# Patient Record
Sex: Female | Born: 1940
Health system: Southern US, Community
[De-identification: ages and names within clinical notes are randomized; demographics above are authoritative.]

## PROBLEM LIST (undated history)

## (undated) DIAGNOSIS — G40909 Epilepsy, unspecified, not intractable, without status epilepticus: Secondary | ICD-10-CM

## (undated) DIAGNOSIS — F329 Major depressive disorder, single episode, unspecified: Secondary | ICD-10-CM

## (undated) DIAGNOSIS — G629 Polyneuropathy, unspecified: Secondary | ICD-10-CM

## (undated) DIAGNOSIS — K5730 Diverticulosis of large intestine without perforation or abscess without bleeding: Secondary | ICD-10-CM

## (undated) DIAGNOSIS — G4733 Obstructive sleep apnea (adult) (pediatric): Secondary | ICD-10-CM

## (undated) DIAGNOSIS — I679 Cerebrovascular disease, unspecified: Secondary | ICD-10-CM

## (undated) DIAGNOSIS — Z9989 Dependence on other enabling machines and devices: Secondary | ICD-10-CM

## (undated) DIAGNOSIS — F32A Depression, unspecified: Secondary | ICD-10-CM

## (undated) DIAGNOSIS — F418 Other specified anxiety disorders: Secondary | ICD-10-CM

## (undated) DIAGNOSIS — J309 Allergic rhinitis, unspecified: Secondary | ICD-10-CM

## (undated) DIAGNOSIS — I10 Essential (primary) hypertension: Secondary | ICD-10-CM

## (undated) DIAGNOSIS — E785 Hyperlipidemia, unspecified: Secondary | ICD-10-CM

## (undated) DIAGNOSIS — E119 Type 2 diabetes mellitus without complications: Secondary | ICD-10-CM

## (undated) DIAGNOSIS — M19019 Primary osteoarthritis, unspecified shoulder: Secondary | ICD-10-CM

## (undated) DIAGNOSIS — K219 Gastro-esophageal reflux disease without esophagitis: Secondary | ICD-10-CM

## (undated) DIAGNOSIS — F419 Anxiety disorder, unspecified: Secondary | ICD-10-CM

## (undated) HISTORY — DX: Primary osteoarthritis, unspecified shoulder: M19.019

## (undated) HISTORY — DX: Anxiety disorder, unspecified: F41.9

## (undated) HISTORY — DX: Depression, unspecified: F32.A

## (undated) HISTORY — PX: APPENDECTOMY: SHX54

## (undated) HISTORY — DX: Diverticulosis of large intestine without perforation or abscess without bleeding: K57.30

## (undated) HISTORY — DX: Dependence on other enabling machines and devices: Z99.89

## (undated) HISTORY — PX: TONSILLECTOMY AND ADENOIDECTOMY: SHX28

## (undated) HISTORY — PX: CHOLECYSTECTOMY: SHX55

## (undated) HISTORY — DX: Essential (primary) hypertension: I10

## (undated) HISTORY — PX: ABDOMINAL HYSTERECTOMY: SHX81

## (undated) HISTORY — DX: Obstructive sleep apnea (adult) (pediatric): G47.33

## (undated) HISTORY — DX: Hyperlipidemia, unspecified: E78.5

## (undated) HISTORY — DX: Polyneuropathy, unspecified: G62.9

## (undated) HISTORY — DX: Gastro-esophageal reflux disease without esophagitis: K21.9

## (undated) HISTORY — DX: Type 2 diabetes mellitus without complications: E11.9

## (undated) HISTORY — DX: Other specified anxiety disorders: F41.8

## (undated) HISTORY — DX: Major depressive disorder, single episode, unspecified: F32.9

## (undated) HISTORY — DX: Epilepsy, unspecified, not intractable, without status epilepticus: G40.909

## (undated) HISTORY — DX: Cerebrovascular disease, unspecified: I67.9

## (undated) HISTORY — DX: Allergic rhinitis, unspecified: J30.9

---
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
## 1997-09-13 ENCOUNTER — Ambulatory Visit (HOSPITAL_COMMUNITY): Admission: RE | Admit: 1997-09-13 | Discharge: 1997-09-13 | Payer: Self-pay | Admitting: *Deleted

## 1997-10-01 ENCOUNTER — Other Ambulatory Visit: Admission: RE | Admit: 1997-10-01 | Discharge: 1997-10-01 | Payer: Self-pay | Admitting: Obstetrics & Gynecology

## 1998-12-15 ENCOUNTER — Ambulatory Visit (HOSPITAL_COMMUNITY): Admission: RE | Admit: 1998-12-15 | Discharge: 1998-12-15 | Payer: Self-pay | Admitting: *Deleted

## 1998-12-15 ENCOUNTER — Encounter: Payer: Self-pay | Admitting: *Deleted

## 1999-01-28 ENCOUNTER — Other Ambulatory Visit: Admission: RE | Admit: 1999-01-28 | Discharge: 1999-01-28 | Payer: Self-pay | Admitting: Obstetrics & Gynecology

## 1999-02-19 ENCOUNTER — Encounter: Payer: Self-pay | Admitting: Obstetrics & Gynecology

## 1999-02-19 ENCOUNTER — Ambulatory Visit (HOSPITAL_COMMUNITY): Admission: RE | Admit: 1999-02-19 | Discharge: 1999-02-19 | Payer: Self-pay | Admitting: Obstetrics & Gynecology

## 2000-02-23 ENCOUNTER — Ambulatory Visit (HOSPITAL_COMMUNITY): Admission: RE | Admit: 2000-02-23 | Discharge: 2000-02-23 | Payer: Self-pay | Admitting: Obstetrics & Gynecology

## 2000-02-23 ENCOUNTER — Encounter: Payer: Self-pay | Admitting: Obstetrics & Gynecology

## 2000-07-10 ENCOUNTER — Other Ambulatory Visit: Admission: RE | Admit: 2000-07-10 | Discharge: 2000-07-10 | Payer: Self-pay | Admitting: Obstetrics and Gynecology

## 2001-10-22 ENCOUNTER — Emergency Department (HOSPITAL_COMMUNITY): Admission: EM | Admit: 2001-10-22 | Discharge: 2001-10-22 | Payer: Self-pay | Admitting: Emergency Medicine

## 2001-10-22 ENCOUNTER — Encounter: Payer: Self-pay | Admitting: Emergency Medicine

## 2002-11-21 ENCOUNTER — Inpatient Hospital Stay (HOSPITAL_COMMUNITY): Admission: EM | Admit: 2002-11-21 | Discharge: 2002-11-23 | Payer: Self-pay | Admitting: Emergency Medicine

## 2002-11-21 ENCOUNTER — Encounter: Payer: Self-pay | Admitting: Neurology

## 2002-11-21 ENCOUNTER — Encounter: Payer: Self-pay | Admitting: Emergency Medicine

## 2003-10-15 ENCOUNTER — Ambulatory Visit (HOSPITAL_COMMUNITY): Admission: RE | Admit: 2003-10-15 | Discharge: 2003-10-15 | Payer: Self-pay | Admitting: *Deleted

## 2004-08-18 ENCOUNTER — Inpatient Hospital Stay (HOSPITAL_COMMUNITY): Admission: EM | Admit: 2004-08-18 | Discharge: 2004-08-20 | Payer: Self-pay | Admitting: Emergency Medicine

## 2004-08-19 ENCOUNTER — Encounter (INDEPENDENT_AMBULATORY_CARE_PROVIDER_SITE_OTHER): Payer: Self-pay | Admitting: *Deleted

## 2004-08-19 ENCOUNTER — Ambulatory Visit: Payer: Self-pay | Admitting: Cardiology

## 2005-05-09 ENCOUNTER — Emergency Department (HOSPITAL_COMMUNITY): Admission: EM | Admit: 2005-05-09 | Discharge: 2005-05-09 | Payer: Self-pay | Admitting: Emergency Medicine

## 2005-10-29 ENCOUNTER — Emergency Department (HOSPITAL_COMMUNITY): Admission: EM | Admit: 2005-10-29 | Discharge: 2005-10-29 | Payer: Self-pay | Admitting: Emergency Medicine

## 2005-11-03 ENCOUNTER — Observation Stay (HOSPITAL_COMMUNITY): Admission: AD | Admit: 2005-11-03 | Discharge: 2005-11-04 | Payer: Self-pay | Admitting: Cardiology

## 2005-11-03 ENCOUNTER — Ambulatory Visit: Payer: Self-pay | Admitting: Cardiology

## 2005-11-16 ENCOUNTER — Ambulatory Visit: Payer: Self-pay | Admitting: Cardiology

## 2006-06-27 HISTORY — PX: SHOULDER SURGERY: SHX246

## 2006-07-07 ENCOUNTER — Observation Stay (HOSPITAL_COMMUNITY): Admission: EM | Admit: 2006-07-07 | Discharge: 2006-07-09 | Payer: Self-pay | Admitting: Emergency Medicine

## 2006-07-07 ENCOUNTER — Ambulatory Visit: Payer: Self-pay | Admitting: Cardiology

## 2006-10-22 ENCOUNTER — Observation Stay (HOSPITAL_COMMUNITY): Admission: EM | Admit: 2006-10-22 | Discharge: 2006-10-24 | Payer: Self-pay | Admitting: Emergency Medicine

## 2007-08-04 ENCOUNTER — Observation Stay (HOSPITAL_COMMUNITY): Admission: EM | Admit: 2007-08-04 | Discharge: 2007-08-05 | Payer: Self-pay | Admitting: Emergency Medicine

## 2007-08-05 ENCOUNTER — Encounter (INDEPENDENT_AMBULATORY_CARE_PROVIDER_SITE_OTHER): Payer: Self-pay | Admitting: Internal Medicine

## 2007-09-10 ENCOUNTER — Emergency Department (HOSPITAL_COMMUNITY): Admission: EM | Admit: 2007-09-10 | Discharge: 2007-09-10 | Payer: Self-pay | Admitting: Emergency Medicine

## 2008-04-03 ENCOUNTER — Inpatient Hospital Stay (HOSPITAL_COMMUNITY): Admission: RE | Admit: 2008-04-03 | Discharge: 2008-04-08 | Payer: Self-pay | Admitting: Specialist

## 2008-04-04 ENCOUNTER — Ambulatory Visit: Payer: Self-pay | Admitting: Physical Medicine & Rehabilitation

## 2008-05-06 ENCOUNTER — Encounter: Admission: RE | Admit: 2008-05-06 | Discharge: 2008-06-04 | Payer: Self-pay | Admitting: Specialist

## 2008-08-16 ENCOUNTER — Emergency Department (HOSPITAL_COMMUNITY): Admission: EM | Admit: 2008-08-16 | Discharge: 2008-08-16 | Payer: Self-pay | Admitting: Emergency Medicine

## 2008-08-25 ENCOUNTER — Encounter: Payer: Self-pay | Admitting: Internal Medicine

## 2009-02-13 ENCOUNTER — Encounter: Payer: Self-pay | Admitting: Internal Medicine

## 2009-02-13 LAB — CONVERTED CEMR LAB
AST: 24 units/L
Alkaline Phosphatase: 86 units/L
BUN: 10 mg/dL
Glucose, Bld: 102 mg/dL
Potassium: 4.1 meq/L
Sodium: 139 meq/L
Total Bilirubin: 0.4 mg/dL
Total Protein: 7.4 g/dL

## 2009-02-25 ENCOUNTER — Encounter: Payer: Self-pay | Admitting: Internal Medicine

## 2009-02-25 LAB — CONVERTED CEMR LAB
BUN: 8 mg/dL
Creatinine, Ser: 0.75 mg/dL
Magnesium: 1.9 mg/dL

## 2009-03-10 ENCOUNTER — Encounter: Payer: Self-pay | Admitting: Cardiovascular Disease

## 2009-03-16 ENCOUNTER — Emergency Department (HOSPITAL_COMMUNITY): Admission: EM | Admit: 2009-03-16 | Discharge: 2009-03-17 | Payer: Self-pay | Admitting: Emergency Medicine

## 2009-03-16 LAB — CONVERTED CEMR LAB
CO2: 23 meq/L
Calcium: 9.4 mg/dL
Chloride: 94 meq/L
Eosinophils Relative: 0.1 %
MCV: 90.6 fL
Neutrophils Relative %: 50 %
Platelets: 228 10*3/uL
Potassium: 3.8 meq/L
RBC: 4.16 M/uL
RDW: 12.6 %
Sodium: 129 meq/L

## 2009-03-18 ENCOUNTER — Ambulatory Visit: Payer: Self-pay | Admitting: Internal Medicine

## 2009-03-18 DIAGNOSIS — M1990 Unspecified osteoarthritis, unspecified site: Secondary | ICD-10-CM

## 2009-03-18 DIAGNOSIS — G4733 Obstructive sleep apnea (adult) (pediatric): Secondary | ICD-10-CM

## 2009-03-18 DIAGNOSIS — K219 Gastro-esophageal reflux disease without esophagitis: Secondary | ICD-10-CM

## 2009-03-18 DIAGNOSIS — R209 Unspecified disturbances of skin sensation: Secondary | ICD-10-CM

## 2009-03-18 DIAGNOSIS — R569 Unspecified convulsions: Secondary | ICD-10-CM

## 2009-03-18 DIAGNOSIS — E1169 Type 2 diabetes mellitus with other specified complication: Secondary | ICD-10-CM | POA: Insufficient documentation

## 2009-03-18 DIAGNOSIS — I10 Essential (primary) hypertension: Secondary | ICD-10-CM | POA: Insufficient documentation

## 2009-03-18 DIAGNOSIS — E785 Hyperlipidemia, unspecified: Secondary | ICD-10-CM

## 2009-03-18 DIAGNOSIS — E1149 Type 2 diabetes mellitus with other diabetic neurological complication: Secondary | ICD-10-CM

## 2009-03-18 HISTORY — DX: Gastro-esophageal reflux disease without esophagitis: K21.9

## 2009-03-18 HISTORY — DX: Obstructive sleep apnea (adult) (pediatric): G47.33

## 2009-03-18 LAB — CONVERTED CEMR LAB
BUN: 7 mg/dL (ref 6–23)
CO2: 32 meq/L (ref 19–32)
Chloride: 99 meq/L (ref 96–112)
Creatinine, Ser: 0.6 mg/dL (ref 0.4–1.2)
Direct LDL: 140.4 mg/dL
Glucose, Bld: 125 mg/dL — ABNORMAL HIGH (ref 70–99)
Total CHOL/HDL Ratio: 4
VLDL: 20.8 mg/dL (ref 0.0–40.0)

## 2009-03-20 ENCOUNTER — Encounter: Admission: RE | Admit: 2009-03-20 | Discharge: 2009-05-15 | Payer: Self-pay | Admitting: Neurology

## 2009-03-20 ENCOUNTER — Encounter: Payer: Self-pay | Admitting: Internal Medicine

## 2009-03-20 DIAGNOSIS — G4700 Insomnia, unspecified: Secondary | ICD-10-CM | POA: Insufficient documentation

## 2009-03-20 DIAGNOSIS — K589 Irritable bowel syndrome without diarrhea: Secondary | ICD-10-CM

## 2009-03-20 HISTORY — DX: Irritable bowel syndrome, unspecified: K58.9

## 2009-03-20 HISTORY — DX: Insomnia, unspecified: G47.00

## 2009-04-01 ENCOUNTER — Ambulatory Visit: Payer: Self-pay | Admitting: Internal Medicine

## 2009-04-01 DIAGNOSIS — R221 Localized swelling, mass and lump, neck: Secondary | ICD-10-CM

## 2009-04-01 DIAGNOSIS — R220 Localized swelling, mass and lump, head: Secondary | ICD-10-CM

## 2009-04-08 ENCOUNTER — Ambulatory Visit: Payer: Self-pay | Admitting: Gastroenterology

## 2009-04-13 DIAGNOSIS — R42 Dizziness and giddiness: Secondary | ICD-10-CM

## 2009-04-13 DIAGNOSIS — F341 Dysthymic disorder: Secondary | ICD-10-CM

## 2009-04-13 HISTORY — DX: Dizziness and giddiness: R42

## 2009-04-14 ENCOUNTER — Ambulatory Visit: Payer: Self-pay | Admitting: Cardiovascular Disease

## 2009-04-15 ENCOUNTER — Encounter: Admission: RE | Admit: 2009-04-15 | Discharge: 2009-04-15 | Payer: Self-pay | Admitting: Internal Medicine

## 2009-04-29 ENCOUNTER — Telehealth (INDEPENDENT_AMBULATORY_CARE_PROVIDER_SITE_OTHER): Payer: Self-pay | Admitting: *Deleted

## 2009-04-30 ENCOUNTER — Encounter: Payer: Self-pay | Admitting: Cardiovascular Disease

## 2009-04-30 ENCOUNTER — Ambulatory Visit: Payer: Self-pay

## 2009-04-30 ENCOUNTER — Ambulatory Visit (HOSPITAL_COMMUNITY): Admission: RE | Admit: 2009-04-30 | Discharge: 2009-04-30 | Payer: Self-pay | Admitting: Cardiovascular Disease

## 2009-04-30 ENCOUNTER — Ambulatory Visit: Payer: Self-pay | Admitting: Internal Medicine

## 2009-04-30 ENCOUNTER — Encounter (HOSPITAL_COMMUNITY): Admission: RE | Admit: 2009-04-30 | Discharge: 2009-06-24 | Payer: Self-pay | Admitting: Cardiovascular Disease

## 2009-05-07 ENCOUNTER — Ambulatory Visit: Payer: Self-pay | Admitting: Gastroenterology

## 2009-05-07 ENCOUNTER — Encounter: Payer: Self-pay | Admitting: Gastroenterology

## 2009-05-08 ENCOUNTER — Encounter: Payer: Self-pay | Admitting: Gastroenterology

## 2009-05-18 ENCOUNTER — Ambulatory Visit: Payer: Self-pay | Admitting: Endocrinology

## 2009-05-18 DIAGNOSIS — E042 Nontoxic multinodular goiter: Secondary | ICD-10-CM

## 2009-05-18 HISTORY — DX: Nontoxic multinodular goiter: E04.2

## 2009-05-25 ENCOUNTER — Ambulatory Visit: Payer: Self-pay | Admitting: Internal Medicine

## 2009-05-25 DIAGNOSIS — R5383 Other fatigue: Secondary | ICD-10-CM

## 2009-05-25 DIAGNOSIS — R5381 Other malaise: Secondary | ICD-10-CM

## 2009-05-25 LAB — CONVERTED CEMR LAB: Blood Glucose, Fingerstick: 140

## 2009-06-02 ENCOUNTER — Encounter: Payer: Self-pay | Admitting: Internal Medicine

## 2009-06-03 ENCOUNTER — Encounter: Payer: Self-pay | Admitting: Internal Medicine

## 2009-07-08 ENCOUNTER — Ambulatory Visit: Payer: Self-pay | Admitting: Internal Medicine

## 2009-07-09 LAB — CONVERTED CEMR LAB: Hgb A1c MFr Bld: 6.4 % (ref 4.6–6.5)

## 2009-09-02 ENCOUNTER — Encounter: Payer: Self-pay | Admitting: Internal Medicine

## 2009-09-23 ENCOUNTER — Telehealth: Payer: Self-pay | Admitting: Internal Medicine

## 2009-10-07 ENCOUNTER — Ambulatory Visit: Payer: Self-pay | Admitting: Internal Medicine

## 2009-10-07 LAB — CONVERTED CEMR LAB
Creatinine,U: 133.9 mg/dL
Microalb, Ur: 1 mg/dL (ref 0.0–1.9)

## 2009-11-18 ENCOUNTER — Telehealth: Payer: Self-pay | Admitting: Internal Medicine

## 2010-01-07 ENCOUNTER — Ambulatory Visit: Payer: Self-pay | Admitting: Internal Medicine

## 2010-01-07 LAB — CONVERTED CEMR LAB: Blood Glucose, Fingerstick: 109

## 2010-01-08 LAB — CONVERTED CEMR LAB: Hgb A1c MFr Bld: 7.1 % — ABNORMAL HIGH (ref 4.6–6.5)

## 2010-01-28 ENCOUNTER — Telehealth: Payer: Self-pay | Admitting: Internal Medicine

## 2010-02-22 ENCOUNTER — Encounter: Payer: Self-pay | Admitting: Internal Medicine

## 2010-03-15 ENCOUNTER — Telehealth: Payer: Self-pay | Admitting: Internal Medicine

## 2010-03-15 DIAGNOSIS — R05 Cough: Secondary | ICD-10-CM

## 2010-03-25 ENCOUNTER — Ambulatory Visit: Payer: Self-pay | Admitting: Pulmonary Disease

## 2010-04-07 ENCOUNTER — Ambulatory Visit: Payer: Self-pay | Admitting: Internal Medicine

## 2010-04-07 LAB — CONVERTED CEMR LAB: Hgb A1c MFr Bld: 7.3 % — ABNORMAL HIGH (ref 4.6–6.5)

## 2010-04-15 ENCOUNTER — Telehealth: Payer: Self-pay | Admitting: Internal Medicine

## 2010-04-16 ENCOUNTER — Inpatient Hospital Stay (HOSPITAL_COMMUNITY): Admission: EM | Admit: 2010-04-16 | Discharge: 2010-04-20 | Payer: Self-pay | Admitting: Emergency Medicine

## 2010-04-18 ENCOUNTER — Encounter: Payer: Self-pay | Admitting: Internal Medicine

## 2010-04-18 LAB — CONVERTED CEMR LAB
BUN: 4 mg/dL
CO2: 26 meq/L
Creatinine, Ser: 0.66 mg/dL
Glucose, Bld: 154 mg/dL
HCT: 30.9 %
MCV: 90.9 fL
RDW: 13 %
Sodium: 137 meq/L

## 2010-04-26 ENCOUNTER — Encounter: Payer: Self-pay | Admitting: Pulmonary Disease

## 2010-04-26 ENCOUNTER — Ambulatory Visit: Payer: Self-pay | Admitting: Pulmonary Disease

## 2010-05-06 ENCOUNTER — Encounter: Payer: Self-pay | Admitting: Internal Medicine

## 2010-05-07 ENCOUNTER — Encounter: Payer: Self-pay | Admitting: Internal Medicine

## 2010-05-07 DIAGNOSIS — Z8719 Personal history of other diseases of the digestive system: Secondary | ICD-10-CM

## 2010-05-10 ENCOUNTER — Ambulatory Visit: Payer: Self-pay | Admitting: Internal Medicine

## 2010-07-27 NOTE — Assessment & Plan Note (Signed)
Summary: 3 MO ROV /NWS  #   Vital Signs:  Patient profile:   70 year old female Height:      66 inches (167.64 cm) Weight:      172.8 pounds (78.55 kg) O2 Sat:      97 % on Room air Temp:     98.3 degrees F (36.83 degrees C) oral Pulse rate:   81 / minute BP sitting:   102 / 62  (left arm) Cuff size:   large  Vitals Entered By: Orlan Leavens (October 07, 2009 10:27 AM)  O2 Flow:  Room air CC: 3 month follow-up Is Patient Diabetic? Yes Did you bring your meter with you today? No Pain Assessment Patient in pain? no      CBG Result 121 CBG Device ID pt check at home this am   Primary Care Provider:  Newt Lukes MD  CC:  3 month follow-up.  History of Present Illness: here for 3 mo  1) HTN - reports compliance with ongoing medical treatment and no changes in medication dose or frequency. c/o dizziness after taking AM pills she feels is improved as she now takes ths diovan before bed (pm)  2) DM2 -  reports compliance with ongoing medical treatment and no changes in medication dose or frequency. checks sugars every morning  - if over 125, takes 1/2 tab glipizide - this occurs 1-2 d/wk - no hypoglycemia symptoms   3) c/o OA pain  - -both shoulders affected - stopped ultracet on becuase not working- still taking tylenol arthritis with incomplete relief of symptoms-   4) gas/abd bloating - has not yet tried any probiotics - food dairy reviewed  Clinical Review Panels:  Diabetes Management   HgBA1C:  6.4 (07/08/2009)   Creatinine:  0.6 (03/18/2009)   Last Flu Vaccine:  Historical (02/25/2009)  CBC   WBC:  7.3 (03/16/2009)   RBC:  4.16 (03/16/2009)   Hgb:  12.9 (03/16/2009)   Hct:  37.7 (03/16/2009)   Platelets:  228 (03/16/2009)   MCV  90.6 (03/16/2009)   RDW  12.6 (03/16/2009)   PMN:  50 (03/16/2009)   Monos:  9 (03/16/2009)   Eosinophils:  0.1 (03/16/2009)  Complete Metabolic Panel   Glucose:  125 (03/18/2009)   Sodium:  139 (03/18/2009)   Potassium:   5.0 (03/18/2009)   Chloride:  99 (03/18/2009)   CO2:  32 (03/18/2009)   BUN:  7 (03/18/2009)   Creatinine:  0.6 (03/18/2009)   Albumin:  4.5 (02/13/2009)   Total Protein:  7.4 (02/13/2009)   Calcium:  9.8 (03/18/2009)   Total Bili:  0.4 (02/13/2009)   Alk Phos:  86 (02/13/2009)   SGPT (ALT):  12 (02/13/2009)   SGOT (AST):  24 (02/13/2009)   Current Medications (verified): 1)  Oxcarbazepine 150 Mg Tabs (Oxcarbazepine) .... Take 1 1/2 Two Times A Day 2)  Glipizide 5 Mg Tabs (Glipizide) .... Take 1/2 Tab By Mouth Once Daily - Hold For Now 3)  Folic Acid 400 Mcg Tabs (Folic Acid) .... Take 1 By Mouth Qd 4)  Klor-Con M20 20 Meq Cr-Tabs (Potassium Chloride Crys Cr) .... Take 1 By Mouth Qd 5)  Hydrochlorothiazide 12.5 Mg Tabs (Hydrochlorothiazide) .... Take 1 As Needed For Edema 6)  Diovan 160 Mg Tabs (Valsartan) .Marland Kitchen.. 1 By Mouth At Bedtime 7)  Aspirin 81 Mg Tabs (Aspirin) .Marland Kitchen.. 1 By Mouth Once Daily 8)  Vitamin B-12 100 Mcg Tabs (Cyanocobalamin) .Marland Kitchen.. 1 By Mouth Once Daily 9)  Multivitamins  Caps (Multiple Vitamin) .Marland Kitchen.. 1 By Mouth Once Daily 10)  Calcium 500 Mg Tabs (Calcium Carbonate) .Marland Kitchen.. 1 By Mouth Once Daily 11)  Bee Pollen .Marland Kitchen.. 1 Two Times A Day 12)  Flax Meal 13)  Triple Boost- Non Caffeine 14)  Fish Oil 1000 Mg Caps (Omega-3 Fatty Acids) .Marland Kitchen.. 1 By Mouth Two Times A Day 15)  Ultracet 37.5-325 Mg Tabs (Tramadol-Acetaminophen) .Marland Kitchen.. 1 By Mouth Every 4hours As Needed For Moderate Pains  Allergies (verified): 1)  ! Penicillin  Past History:  Past Medical History: CHEST PAIN hx- cath 07/2004, [facility]: no sig CAD Diabetes mellitus, type II GERD Hypertension Seizure disorder dyslipidemia OSA - CPAP depression/anxiety osteoarthritis, esp shoulder pain  MD rooster: ortho - [facility] -[facility] neuro - [facility] - cards- [facility] -    Review of Systems  The patient denies anorexia, fever, weight loss, chest pain, syncope, dyspnea on exertion, peripheral edema, and abdominal pain.     Physical Exam  General:  alert, well-developed, well-nourished, and cooperative to examination.    Lungs:  normal respiratory effort, no intercostal retractions or use of accessory muscles; normal breath sounds bilaterally - no crackles and no wheezes.    Heart:  normal rate, regular rhythm, no murmur, and no rub. BLE without edema. Abdomen:  soft, non-tender, normal bowel sounds, no distention; no masses and no appreciable hepatomegaly or splenomegaly.   Psych:  Oriented X3, memory intact for recent and remote, normally interactive, good eye contact, not anxious appearing, not depressed appearing, and not agitated.      Impression & Recommendations:  Problem # 1:  DIABETES MELLITUS, TYPE II (ICD-250.00)  takes OHA as needed high sugars - mostly diet controlled by pt report - recheck a1c + urine studies to monitor control Her updated medication list for this problem includes:    Glipizide 5 Mg Tabs (Glipizide) .Marland Kitchen... Take 1/2 tab by mouth  every morning for sugar over 125    Diovan 160 Mg Tabs (Valsartan) .Marland Kitchen... 1 by mouth at bedtime    Aspirin 81 Mg Tabs (Aspirin) .Marland Kitchen... 1 by mouth once daily  Orders: TLB-A1C / Hgb A1C (Glycohemoglobin) (83036-A1C) TLB-Microalbumin/Creat Ratio, Urine (82043-MALB)  Labs Reviewed: Creat: 0.6 (03/18/2009)    Reviewed HgBA1c results: 6.4 (07/08/2009)  6.8 (03/18/2009)  Problem # 2:  HYPERTENSION (ICD-401.9)  Her updated medication list for this problem includes:    Diovan 160 Mg Tabs (Valsartan) .Marland Kitchen... 1 by mouth at bedtime    Hydrochlorothiazide 12.5 Mg Tabs (Hydrochlorothiazide) .Marland Kitchen... Take 1 as needed for edema  BP today: 102/62 Prior BP: 112/80 (07/08/2009)  Labs Reviewed: K+: 5.0 (03/18/2009) Creat: : 0.6 (03/18/2009)   Chol: 217 (03/18/2009)   HDL: 57.50 (03/18/2009)   TG: 104.0 (03/18/2009)  Problem # 3:  DYSLIPIDEMIA (ICD-272.4)  Labs Reviewed: SGOT: 24 (02/13/2009)   SGPT: 12 (02/13/2009)   HDL:57.50 (03/18/2009)  Chol:217  (03/18/2009)  Trig:104.0 (03/18/2009)  Problem # 4:  OSTEOARTHRITIS (ICD-715.90) cont mgmt by ortho as needed and tylenol arthirits +fish oil The following medications were removed from the medication list:    Ultracet 37.5-325 Mg Tabs (Tramadol-acetaminophen) .Marland Kitchen... 1 by mouth every 4hours as needed for moderate pains Her updated medication list for this problem includes:    Aspirin 81 Mg Tabs (Aspirin) .Marland Kitchen... 1 by mouth once daily  Problem # 5:  INSOMNIA (ICD-780.52)  Her updated medication list for this problem includes:    Tylenol Pm Extra Strength 500-25 Mg Tabs (Diphenhydramine-apap (sleep)) .Marland Kitchen... 1 by mouth at bedtime as needed  Time spent with patient 25 minutes, more than 50% of this time was spent revieweing her medications and rec from other providers as brought with her today - suggestions for OTC meds as needed (pt preference)  Complete Medication List: 1)  Oxcarbazepine 150 Mg Tabs (Oxcarbazepine) .... Take 1 1/2 two times a day 2)  Glipizide 5 Mg Tabs (Glipizide) .... Take 1/2 tab by mouth  every morning for sugar over 125 3)  Diovan 160 Mg Tabs (Valsartan) .Marland Kitchen.. 1 by mouth at bedtime 4)  Folic Acid 400 Mcg Tabs (Folic acid) .... Take 1 by mouth once daily 5)  Hydrochlorothiazide 12.5 Mg Tabs (Hydrochlorothiazide) .... Take 1 as needed for edema 6)  Klor-con M20 20 Meq Cr-tabs (Potassium chloride crys cr) .... Take 1 by mouth once daily as needed (with hydrocholrthizde for edema) 7)  Aspirin 81 Mg Tabs (Aspirin) .Marland Kitchen.. 1 by mouth once daily 8)  Vitamin B-12 100 Mcg Tabs (Cyanocobalamin) .Marland Kitchen.. 1 by mouth once daily 9)  Multivitamins Caps (Multiple vitamin) .Marland Kitchen.. 1 by mouth once daily 10)  Calcium 500 Mg Tabs (Calcium carbonate) .Marland Kitchen.. 1 by mouth once daily 11)  Bee Pollen  .Marland Kitchen.. 1 two times a day 12)  Flax Meal  13)  Triple Boost- Non Caffeine  14)  Fish Oil 1000 Mg Caps (Omega-3 fatty acids) .Marland Kitchen.. 1 by mouth two times a day 15)  Tylenol Pm Extra Strength 500-25 Mg Tabs  (Diphenhydramine-apap (sleep)) .Marland Kitchen.. 1 by mouth at bedtime as needed 16)  Align Caps (Probiotic product) .Marland Kitchen.. 1 by mouth once daily 17)  Vitamin D3 1000 Unit Tabs (Cholecalciferol) .Marland Kitchen.. 1 by mouth once daily  Patient Instructions: 1)  it was good to see you today.  2)  medications reviewed as discussed - see list below 3)  test(s) ordered today - your results will be posted on the phone tree for review in 48-72 hours from the time of test completion; call [phone] and enter your 9 digit MRN (listed above on this page, just below your name); if any changes need to be made or there are abnormal results, you will be contacted directly.  4)  Please schedule a follow-up appointment in 3-4 months, sooner if problems.    Orders Added: 1)  TLB-A1C / Hgb A1C (Glycohemoglobin) [83036-A1C] 2)  TLB-Microalbumin/Creat Ratio, Urine [82043-MALB] 3)  Est. Patient Level IV [24580]

## 2010-07-27 NOTE — Letter (Signed)
Summary: [facility]  [facility]   Imported By: Sherian Rein 02/25/2010 13:37:37  _____________________________________________________________________  External Attachment:    Type:   Image     Comment:   External Document

## 2010-07-27 NOTE — Progress Notes (Signed)
Summary: HCTZ  Phone Note Refill Request Message from:  Fax from Pharmacy on September 23, 2009 2:33 PM  Refills Requested: Medication #1:  GLIPIZIDE 5 MG TABS take 1/2 tab by mouth once daily - HOLD for now  Medication #2:  HYDROCHLOROTHIAZIDE 12.5 MG TABS take 1 as needed for edema Initial call taken by: Orlan Leavens,  September 23, 2009 2:46 PM  Follow-up for Phone Call        Faxed back pt req for Glipizide rx denied @ last ov 07/18/09 md told her to [facility] for now since BS was running low. Need to make f/u appt Follow-up by: Orlan Leavens,  September 23, 2009 2:48 PM    Prescriptions: HYDROCHLOROTHIAZIDE 12.5 MG TABS (HYDROCHLOROTHIAZIDE) take 1 as needed for edema  #90 x 1   Entered by:   Orlan Leavens   Authorized by:   Newt Lukes MD   Signed by:   Orlan Leavens on 09/23/2009   Method used:   Faxed to ...       [facility]* (mail-order)             ,          Ph: [phone]       Fax: [phone]   RxID:   [id number]

## 2010-07-27 NOTE — Assessment & Plan Note (Signed)
Summary: 3-4 FU---STC   Vital Signs:  Patient profile:   70 year old female Height:      66 inches (167.64 cm) Weight:      177.4 pounds (80.64 kg) O2 Sat:      97 % on Room air Temp:     98.2 degrees F (36.78 degrees C) oral Pulse rate:   72 / minute BP sitting:   110 / 72  (left arm) Cuff size:   regular  Vitals Entered By: Orlan Leavens (January 07, 2010 10:36 AM)  O2 Flow:  Room air CC: 3 month follow-up Is Patient Diabetic? Yes Did you bring your meter with you today? No Pain Assessment Patient in pain? no      CBG Result 109 CBG Device ID pt check BS at home this am Comments Need rx sent to [facility] on her HCTZ   Primary Care Provider:  Newt Lukes MD  CC:  3 month follow-up.  History of Present Illness: here for 3 mo  1) HTN - reports compliance with ongoing medical treatment and no changes in medication dose or frequency. c/o dizziness after taking AM pills she feels is improved as she now takes ths diovan before bed (pm)  2) DM2 -  reports compliance with ongoing medical treatment and no changes in medication dose or frequency. checks sugars every morning  - if over 125, takes 1/2 tab glipizide - this occurs 1-2 d/wk - no hypoglycemia symptoms   3) c/o OA pain  - -both shoulders affected - stopped ultracet on becuase not working- still taking tylenol arthritis with incomplete relief of symptoms-   4) gas/abd bloating - improved with probiotics - food dairy reviewed  also c/o sinus pressure and cough - ongoing > 2 weeks - +ose and post nasla drip - pressure in right frontal sinus and right ear but no pain., denies nasla drainage not using any otc med for allergy symptoms     Clinical Review Panels:  Lipid Management   Cholesterol:  217 (03/18/2009)   HDL (good cholesterol):  57.50 (03/18/2009)  Diabetes Management   HgBA1C:  6.7 (10/07/2009)   Creatinine:  0.6 (03/18/2009)   Last Flu Vaccine:  Historical (02/25/2009)  CBC   WBC:  7.3  (03/16/2009)   RBC:  4.16 (03/16/2009)   Hgb:  12.9 (03/16/2009)   Hct:  37.7 (03/16/2009)   Platelets:  228 (03/16/2009)   MCV  90.6 (03/16/2009)   RDW  12.6 (03/16/2009)   PMN:  50 (03/16/2009)   Monos:  9 (03/16/2009)   Eosinophils:  0.1 (03/16/2009)  Complete Metabolic Panel   Glucose:  125 (03/18/2009)   Sodium:  139 (03/18/2009)   Potassium:  5.0 (03/18/2009)   Chloride:  99 (03/18/2009)   CO2:  32 (03/18/2009)   BUN:  7 (03/18/2009)   Creatinine:  0.6 (03/18/2009)   Albumin:  4.5 (02/13/2009)   Total Protein:  7.4 (02/13/2009)   Calcium:  9.8 (03/18/2009)   Total Bili:  0.4 (02/13/2009)   Alk Phos:  86 (02/13/2009)   SGPT (ALT):  12 (02/13/2009)   SGOT (AST):  24 (02/13/2009)   Current Medications (verified): 1)  Oxcarbazepine 150 Mg Tabs (Oxcarbazepine) .... Take 1 1/2 Two Times A Day 2)  Glipizide 5 Mg Tabs (Glipizide) .... Take 1/2 Tab By Mouth  Every Morning For Sugar Over 125 3)  Diovan 160 Mg Tabs (Valsartan) .Marland Kitchen.. 1 By Mouth At Bedtime 4)  Folic Acid 400 Mcg Tabs (Folic Acid) .... Take  1 By Mouth Once Daily 5)  Hydrochlorothiazide 12.5 Mg Tabs (Hydrochlorothiazide) .... Take 1 As Needed For Edema 6)  Klor-Con M20 20 Meq Cr-Tabs (Potassium Chloride Crys Cr) .... Take 1 By Mouth Once Daily As Needed (With Hydrocholrthizde For Edema) 7)  Aspirin 81 Mg Tabs (Aspirin) .Marland Kitchen.. 1 By Mouth Once Daily 8)  Vitamin B-12 100 Mcg Tabs (Cyanocobalamin) .Marland Kitchen.. 1 By Mouth Once Daily 9)  Multivitamins  Caps (Multiple Vitamin) .Marland Kitchen.. 1 By Mouth Once Daily 10)  Calcium 500 Mg Tabs (Calcium Carbonate) .Marland Kitchen.. 1 By Mouth Once Daily 11)  Bee Pollen .Marland Kitchen.. 1 Two Times A Day 12)  Flax Meal 13)  Triple Boost- Non Caffeine 14)  Fish Oil 1000 Mg Caps (Omega-3 Fatty Acids) .Marland Kitchen.. 1 By Mouth Two Times A Day 15)  Tylenol Pm Extra Strength 500-25 Mg Tabs (Diphenhydramine-Apap (Sleep)) .Marland Kitchen.. 1 By Mouth At Bedtime As Needed 16)  Vitamin D3 1000 Unit Tabs (Cholecalciferol) .Marland Kitchen.. 1 By Mouth Once Daily 17)   Sussunex (Probiotic) .... Take 1 By Mouth Once Daily  Allergies (verified): 1)  ! Penicillin  Past History:  Past Medical History: CHEST PAIN hx- cath 07/2004, [place]: no sig CAD Diabetes mellitus, type II GERD Hypertension Seizure disorder dyslipidemia OSA - CPAP depression/anxiety osteoarthritis, esp shoulder pain  MD roster: ortho [facility] -[facility]osen neuro - [facility] - cards- [facility] -    Review of Systems  The patient denies fever, chest pain, dyspnea on exertion, and headaches.    Physical Exam  General:  alert, well-developed, well-nourished, and cooperative to examination.    Ears:  normal pinnae bilaterally, without erythema, swelling, or tenderness to palpation. L TM clear, R TM hazy but without effusion, or cerumen impaction. Hearing grossly normal bilaterally  Mouth:  teeth and gums in good repair; mucous membranes moist, without lesions or ulcers. oropharynx clear without exudate, no erythema. +PND Lungs:  normal respiratory effort, no intercostal retractions or use of accessory muscles; normal breath sounds bilaterally - no crackles and no wheezes.    Heart:  normal rate, regular rhythm, no murmur, and no rub. BLE without edema. Neurologic:  alert & oriented X3 and cranial nerves II-XII symetrically intact.  strength normal in all extremities, sensation intact to light touch, and gait normal. speech fluent without dysarthria or aphasia; follows commands with good comprehension.    Impression & Recommendations:  Problem # 1:  ACUTE SINUSITIS, UNSPECIFIED (ICD-461.9)  abx, nasal steroid antihist and cough relief (tessalon + H2B) Her updated medication list for this problem includes:    Azithromycin 250 Mg Tabs (Azithromycin) .Marland Kitchen... 2 tabs by mouth today, then 1 by mouth daily starting tomorrow    Tessalon 200 Mg Caps (Benzonatate) .Marland Kitchen... 1 by mouth three times a day x 5 days, then as needed for cough    Nasonex 50 Mcg/act Susp (Mometasone furoate) .Marland Kitchen... 2  sprays each nostril  every morning  Orders: Prescription Created Electronically [phone])  Instructed on treatment. Call if symptoms persist or worsen.   Problem # 2:  DIABETES MELLITUS, TYPE II (ICD-250.00)  Her updated medication list for this problem includes:    Glipizide 5 Mg Tabs (Glipizide) .Marland Kitchen... Take 1/2 tab by mouth  every morning for sugar over 125    Diovan 160 Mg Tabs (Valsartan) .Marland Kitchen... 1 by mouth at bedtime    Aspirin 81 Mg Tabs (Aspirin) .Marland Kitchen... 1 by mouth once daily  Orders: TLB-A1C / Hgb A1C (Glycohemoglobin) (83036-A1C)  Labs Reviewed: Creat: 0.6 (03/18/2009)    Reviewed HgBA1c  results: 6.7 (10/07/2009)  6.4 (07/08/2009)  Problem # 3:  HYPERTENSION (ICD-401.9)  Her updated medication list for this problem includes:    Diovan 160 Mg Tabs (Valsartan) .Marland Kitchen... 1 by mouth at bedtime    Hydrochlorothiazide 12.5 Mg Tabs (Hydrochlorothiazide) .Marland Kitchen... Take 1 as needed for edema  BP today: 110/72 Prior BP: 102/62 (10/07/2009)  Labs Reviewed: K+: 5.0 (03/18/2009) Creat: : 0.6 (03/18/2009)   Chol: 217 (03/18/2009)   HDL: 57.50 (03/18/2009)   TG: 104.0 (03/18/2009)  Problem # 4:  DYSLIPIDEMIA (ICD-272.4) diet controlled + fish oil  Labs Reviewed: SGOT: 24 (02/13/2009)   SGPT: 12 (02/13/2009)   HDL:57.50 (03/18/2009)  Chol:217 (03/18/2009)  Trig:104.0 (03/18/2009)  Complete Medication List: 1)  Oxcarbazepine 150 Mg Tabs (Oxcarbazepine) .... Take 1 1/2 two times a day 2)  Glipizide 5 Mg Tabs (Glipizide) .... Take 1/2 tab by mouth  every morning for sugar over 125 3)  Diovan 160 Mg Tabs (Valsartan) .Marland Kitchen.. 1 by mouth at bedtime 4)  Folic Acid 400 Mcg Tabs (Folic acid) .... Take 1 by mouth once daily 5)  Hydrochlorothiazide 12.5 Mg Tabs (Hydrochlorothiazide) .... Take 1 as needed for edema 6)  Klor-con M20 20 Meq Cr-tabs (Potassium chloride crys cr) .... Take 1 by mouth once daily as needed (with hydrocholrthizde for edema) 7)  Aspirin 81 Mg Tabs (Aspirin) .Marland Kitchen.. 1 by mouth once  daily 8)  Vitamin B-12 100 Mcg Tabs (Cyanocobalamin) .Marland Kitchen.. 1 by mouth once daily 9)  Multivitamins Caps (Multiple vitamin) .Marland Kitchen.. 1 by mouth once daily 10)  Calcium 500 Mg Tabs (Calcium carbonate) .Marland Kitchen.. 1 by mouth once daily 11)  Triple Boost- Non Caffeine  12)  Fish Oil 1000 Mg Caps (Omega-3 fatty acids) .Marland Kitchen.. 1 by mouth two times a day 13)  Tylenol Pm Extra Strength 500-25 Mg Tabs (Diphenhydramine-apap (sleep)) .Marland Kitchen.. 1 by mouth at bedtime as needed 14)  Vitamin D3 1000 Unit Tabs (Cholecalciferol) .Marland Kitchen.. 1 by mouth once daily 15)  Sussunex (probiotic)  .... Take 1 by mouth once daily 16)  Azithromycin 250 Mg Tabs (Azithromycin) .... 2 tabs by mouth today, then 1 by mouth daily starting tomorrow 17)  Tessalon 200 Mg Caps (Benzonatate) .Marland Kitchen.. 1 by mouth three times a day x 5 days, then as needed for cough 18)  Loratadine 10 Mg Tabs (Loratadine) .Marland Kitchen.. 1 by mouth once daily x 2 weeks, then as needed for allergy symptoms 19)  Pepcid 20 Mg Tabs (Famotidine) .Marland Kitchen.. 1 by mouth once daily x 2 weeks, then as needed for reflux symptoms 20)  Nasonex 50 Mcg/act Susp (Mometasone furoate) .... 2 sprays each nostril  every morning  Patient Instructions: 1)  it was good to see you today. 2)  test(s) ordered today - your results will be posted on the phone tree for review in 48-72 hours from the time of test completion; call [phone] and enter your 9 digit MRN (listed above on this page, just below your name); if any changes need to be made or there are abnormal results, you will be contacted directly.  3)  antibiotics (azithromycin) and tessalon for your sinus and cough - your prescriptions have been electronically submitted to your pharmacy. Please take as directed. Contact our office if you believe you're having problems with the medication(s).  4)  also use nasal spray (sample given) and the allergy + reflux medicine as instructed for cough -  5)  no other medication changes today - 6)  Get plenty of rest, drink lots  of clear  liquids, and use Tylenol or Ibuprofen for fever and comfort. Return in 7-10 days if you're not better:sooner if you're feeling worse. 7)  Please schedule a follow-up appointment in 3-4 months, sooner if problems.  Prescriptions: TESSALON 200 MG CAPS (BENZONATATE) 1 by mouth three times a day x 5 days, then as needed for cough  #30 x 0   Entered and Authorized by:   Newt Lukes MD   Signed by:   Newt Lukes MD on 01/07/2010   Method used:   Electronically to        [facility]  [address] [phone]* (retail)       [address]       [place], [place]  [id number]       Ph: [phone] or [phone]       Fax: [phone]   RxID:   [id number] AZITHROMYCIN 250 MG TABS (AZITHROMYCIN) 2 tabs by mouth today, then 1 by mouth daily starting tomorrow  #6 x 0   Entered and Authorized by:   Newt Lukes MD   Signed by:   Newt Lukes MD on 01/07/2010   Method used:   [facility] to        [facility]  [address] [phone]* (retail)       [address]       [place], [place]  [id number]       Ph: [phone] or [phone]       Fax: [phone]   RxID:   [id number] HYDROCHLOROTHIAZIDE 12.5 MG TABS (HYDROCHLOROTHIAZIDE) take 1 as needed for edema  #90 x 1   Entered by:   Orlan Leavens   Authorized by:   Newt Lukes MD   Signed by:   Orlan Leavens on 01/07/2010   Method used:   Faxed to ...       [facility] (mail-order)             , [place]         Ph: [phone]       Fax: [phone]   RxID:   [id number]

## 2010-07-27 NOTE — Progress Notes (Signed)
Summary: Cough  Phone Note Call from Patient Call back at Home Phone [phone]   Caller: Patient Summary of Call: Pt called stating that she still has cough and she is concerned. Pt states the cough syrup nor the tablets has resolved cough and she wants to know what MD suggest be done next.  Please advise Initial call taken by: Margaret Pyle, CMA,  March 15, 2010 4:41 PM  Follow-up for Phone Call        cough > 18mo, failing to respond to med tx- will refer to pulm for eval and tx - thanks Follow-up by: Newt Lukes MD,  March 15, 2010 5:02 PM  Additional Follow-up for Phone Call Additional follow up Details #1::        Pt's spouse informed and will tell pt to expect call from [facility] with appt info Additional Follow-up by: Margaret Pyle, CMA,  March 16, 2010 8:14 AM  New Problems: COUGH (ICD-786.2)   New Problems: COUGH (ICD-786.2)

## 2010-07-27 NOTE — Miscellaneous (Signed)
Summary: Pulmonary function test   Pulmonary Function Test Date: 04/26/2010 Height (in.): 63 Gender: Female  Pre-Spirometry FVC    Value: 2.50 L/min   Pred: 2.79 L/min     % Pred: 90 % FEV1    Value: 2.01 L     Pred: 2.00 L     % Pred: 101 % FEV1/FVC  Value: 81 %     Pred: 72 %     % Pred: . % FEF 25-75  Value: 2.08 L/min   Pred: 2.32 L/min     % Pred: 90 %  Post-Spirometry FVC    Value: 2.39 L/min   Pred: 2.79 L/min     % Pred: 86 % FEV1    Value: 1.94 L     Pred: 2.00 L     % Pred: 97 % FEV1/FVC  Value: 81 %     Pred: 72 %     % Pred: . % FEF 25-75  Value: 2.11 L/min   Pred: 2.32 L/min     % Pred: 91 %  Lung Volumes TLC    Value: 3.89 L   % Pred: 84 % RV    Value: 1.33 L   % Pred: 73 % DLCO    Value: 17.6 %   % Pred: 85 % DLCO/VA  Value: 4.47 %   % Pred: 124 %  Comments: Normal spirometry.  Normal lung volumes.  Normal diffusion.  No bronchodilator response. Clinical Lists Changes  Observations: Added new observation of PFT COMMENTS: Normal spirometry.  Normal lung volumes.  Normal diffusion.  No bronchodilator response. (04/26/2010 10:27) Added new observation of DLCO/VA%EXP: 124 % (04/26/2010 10:27) Added new observation of DLCO/VA: 4.47 % (04/26/2010 10:27) Added new observation of DLCO % EXPEC: 85 % (04/26/2010 10:27) Added new observation of DLCO: 17.6 % (04/26/2010 10:27) Added new observation of RV % EXPECT: 73 % (04/26/2010 10:27) Added new observation of RV: 1.33 L (04/26/2010 10:27) Added new observation of TLC % EXPECT: 84 % (04/26/2010 10:27) Added new observation of TLC: 3.89 L (04/26/2010 10:27) Added new observation of FEF2575%EXPS: 91 % (04/26/2010 10:27) Added new observation of PSTFEF25/75P: 2.32  (04/26/2010 10:27) Added new observation of PSTFEF25/75%: 2.11 L/min (04/26/2010 10:27) Added new observation of PSTFEV1/FCV%: . % (04/26/2010 10:27) Added new observation of FEV1FVCPRDPS: 72 % (04/26/2010 10:27) Added new observation of PSTFEV1/FVC: 81 %  (04/26/2010 10:27) Added new observation of POSTFEV1%PRD: 97 % (04/26/2010 10:27) Added new observation of FEV1PRDPST: 2.00 L (04/26/2010 10:27) Added new observation of POST FEV1: 1.94 L/min (04/26/2010 10:27) Added new observation of POST FVC%EXP: 86 % (04/26/2010 10:27) Added new observation of FVCPRDPST: 2.79 L/min (04/26/2010 10:27) Added new observation of POST FVC: 2.39 L (04/26/2010 10:27) Added new observation of FEF % EXPEC: 90 % (04/26/2010 10:27) Added new observation of FEF25-75%PRE: 2.32 L/min (04/26/2010 10:27) Added new observation of FEF 25-75%: 2.08 L/min (04/26/2010 10:27) Added new observation of FEV1/FVC%EXP: . % (04/26/2010 10:27) Added new observation of FEV1/FVC PRE: 72 % (04/26/2010 10:27) Added new observation of FEV1/FVC: 81 % (04/26/2010 10:27) Added new observation of FEV1 % EXP: 101 % (04/26/2010 10:27) Added new observation of FEV1 PREDICT: 2.00 L (04/26/2010 10:27) Added new observation of FEV1: 2.01 L (04/26/2010 10:27) Added new observation of FVC % EXPECT: 90 % (04/26/2010 10:27) Added new observation of FVC PREDICT: 2.79 L (04/26/2010 10:27) Added new observation of FVC: 2.50 L (04/26/2010 10:27) Added new observation of PFT HEIGHT: 63  (04/26/2010 10:27) Added new observation of PFT DATE:  04/26/2010  (04/26/2010 10:27) 

## 2010-07-27 NOTE — Assessment & Plan Note (Signed)
Summary: post hosp [facility]/#//cd   Vital Signs:  Patient profile:   70 year old female Height:      63 inches (160.02 cm) Weight:      170.12 pounds (77.33 kg) O2 Sat:      97 % on Room air Temp:     97.8 degrees F (36.56 degrees C) oral Pulse rate:   75 / minute BP sitting:   110 / 72  (left arm) Cuff size:   regular  Vitals Entered By: Orlan Leavens RMA (May 10, 2010 9:08 AM)  O2 Flow:  Room air CC: Hosp f/u Is Patient Diabetic? Yes Did you bring your meter with you today? No Pain Assessment Patient in pain? yes     Location: (R) groin area Type: cramping Onset of pain  Pt states yesterday she started having some cramping in her (R) groin area   Primary Care Provider:  Newt Lukes MD  CC:  Hosp f/u.  History of Present Illness: here for hosp f/u - at [facility] 03/2010 for acute diverticulitis - since home, abd pain resolved, completed all abx - back to baseline health  also reviewed chronic med issues: 1) HTN - reports compliance with ongoing medical treatment and no changes in medication dose or frequency. c/o dizziness after taking AM pills she feels is improved as she now takes ths diovan before bed (pm)  2) DM2 -  reports compliance with ongoing medical treatment and no changes in medication dose or frequency. checks sugars every morning  - if over 125, takes 1/2 tab glipizide; whole tab if >150 - this occurs 3-4 d/wk - no hypoglycemia symptoms   3) OA - continue pain in R knee and both shoulders - stopped ultracet on becuase not working- still taking tylenol arthritis with incomplete relief of symptoms-  s/p steroid shot due to flare and fall 03/2010  4) chronic gas/abd bloating - improved with probiotics - food dairy reviewed  5) chronic dry cough - now seeing pulm for same not resolved with H2B and antihistamine and antitussives now on asthma meds and nasal steroids for postnasal drip  Clinical Review Panels:  Diabetes Management   HgBA1C:  7.3  (04/07/2010)   Creatinine:  0.66 (04/18/2010)   Last Flu Vaccine:  Historical given @ [facility] (04/01/2010)   Last Pneumovax:  Historical (06/28/2007)  CBC   WBC:  7.5 (04/18/2010)   RBC:  3.40 (04/18/2010)   Hgb:  10.4 (04/18/2010)   Hct:  30.9 (04/18/2010)   Platelets:  245 (04/18/2010)   MCV  90.9 (04/18/2010)   RDW  13.0 (04/18/2010)   PMN:  50 (03/16/2009)   Monos:  9 (03/16/2009)   Eosinophils:  0.1 (03/16/2009)  Complete Metabolic Panel   Glucose:  154 (04/18/2010)   Sodium:  137 (04/18/2010)   Potassium:  4.2 (04/18/2010)   Chloride:  105 (04/18/2010)   CO2:  26 (04/18/2010)   BUN:  4 (04/18/2010)   Creatinine:  0.66 (04/18/2010)   Albumin:  4.5 (02/13/2009)   Total Protein:  7.4 (02/13/2009)   Calcium:  8.9 (04/18/2010)   Total Bili:  0.4 (02/13/2009)   Alk Phos:  86 (02/13/2009)   SGPT (ALT):  12 (02/13/2009)   SGOT (AST):  24 (02/13/2009)   Current Medications (verified): 1)  Proair Hfa 108 (90 Base) Mcg/act Aers (Albuterol Sulfate) .... Two Puffs Up To Four Times Per Day As Needed 2)  Flonase 50 Mcg/act Susp (Fluticasone Propionate) .... Two Sprays Once Daily 3)  Loratadine  10 Mg Tabs (Loratadine) .Marland Kitchen.. 1 By Mouth Once Daily X 2 Weeks, Then As Needed For Allergy Symptoms 4)  Oxcarbazepine 150 Mg Tabs (Oxcarbazepine) .... Take 1 1/2 Two Times A Day 5)  Glipizide 5 Mg Tabs (Glipizide) .... Take 1/2 Tab By Mouth  Every Morning For Sugar Over 125 6)  Losartan Potassium 50 Mg Tabs (Losartan Potassium) .Marland Kitchen.. 1 By Mouth Once Daily 7)  Hydrochlorothiazide 12.5 Mg Tabs (Hydrochlorothiazide) .... Take 1 As Needed For Edema 8)  Klor-Con M20 20 Meq Cr-Tabs (Potassium Chloride Crys Cr) .... Take 1 By Mouth Once Daily As Needed (With Hydrocholrthizde For Edema) 9)  Aspirin 81 Mg Tabs (Aspirin) .Marland Kitchen.. 1 By Mouth Once Daily 10)  Folic Acid 400 Mcg Tabs (Folic Acid) .... Take 1 By Mouth Once Daily 11)  Vitamin B-12 100 Mcg Tabs (Cyanocobalamin) .Marland Kitchen.. 1 By Mouth Once Daily 12)   Multivitamins  Caps (Multiple Vitamin) .Marland Kitchen.. 1 By Mouth Once Daily 13)  Vitamin D3 1000 Unit Tabs (Cholecalciferol) .Marland Kitchen.. 1 By Mouth Once Daily 14)  Calcium 500 Mg Tabs (Calcium Carbonate) .Marland Kitchen.. 1 By Mouth Once Daily 15)  Sussunex (Probiotic) .... Take 1 By Mouth Once Daily 16)  Tylenol Ex St Arthritis Pain 500 Mg Tabs (Acetaminophen) .... Take 1 By Mouth At Bedtime 17)  Famotidine 20 Mg Tabs (Famotidine) .Marland Kitchen.. 1 By Mouth Once Daily 18)  Metformin Hcl 500 Mg Xr24h-Tab (Metformin Hcl) .Marland Kitchen.. 1 By Mouth Once Daily 19)  Docusate Sodium 100 Mg Tabs (Docusate Sodium) .Marland Kitchen.. 1 By Mouth Two Times A Day 20)  Ensure  Liqd (Nutritional Supplements) .... Once Daily  Allergies (verified): 1)  ! Penicillin  Past History:  Past Medical History: CHEST PAIN hx- cath 07/2004, [facility]: no sig CAD Diabetes mellitus, type II GERD Hypertension Seizure disorder  dyslipidemia OSA - CPAP depression/anxiety osteoarthritis, esp shoulder + knee pain Cough likely from postnasal drip      - Normal chest xray 03/25/10      - PFT 04/26/10 FEV1 2.01(101%), FEV1% 81, TLC 3.84(84%), DLCO 85%, no BD Diverticulosis with acute diverticulitis Oct. 2011    MD roster: [facility] -Pollyann Kennedy neuro - [facility] cards- [facility]  pulm - Sood  Review of Systems  The patient denies fever, weight loss, headaches, melena, hematochezia, and severe indigestion/heartburn.    Physical Exam  General:  alert, well-developed, well-nourished, and cooperative to examination.    Lungs:  normal respiratory effort, no intercostal retractions or use of accessory muscles; normal breath sounds bilaterally - no crackles and no wheezes.    Heart:  normal rate, regular rhythm, no murmur, and no rub. BLE without edema. Abdomen:  soft, non-tender, normal bowel sounds, no distention; no masses and no appreciable hepatomegaly or splenomegaly.     Impression & Recommendations:  Problem # 1:  DIVERTICULITIS, HX OF (ICD-V12.79) hosp 03/2010  including CT and labs reviewed - acute descending divertic now improved symptoms - Time spent with patient 25 minutes, more than 50% of this time was spent counseling patient on diverticulosis diet and restrictions as well as review of current meds and symptoms   Problem # 2:  DIABETES MELLITUS, TYPE II (ICD-250.00)  Her updated medication list for this problem includes:    Glipizide 5 Mg Tabs (Glipizide) .Marland Kitchen... Take 1/2 tab by mouth  every morning for sugar over 125    Losartan Potassium 50 Mg Tabs (Losartan potassium) .Marland Kitchen... 1 by mouth once daily    Aspirin 81 Mg Tabs (Aspirin) .Marland Kitchen... 1 by mouth  once daily    Metformin Hcl 500 Mg Xr24h-tab (Metformin hcl) .Marland Kitchen... 1 by mouth once daily  upward trend, by hx and a1c since 06/2009 started metformin 12/2009 -  ok to continue glipizide "as needed"  Labs Reviewed: Creat: 0.66 (04/18/2010)    Reviewed HgBA1c results: 7.3 (04/07/2010)  7.1 (01/07/2010)  Problem # 3:  HYPERTENSION (ICD-401.9)  Her updated medication list for this problem includes:    Losartan Potassium 50 Mg Tabs (Losartan potassium) .Marland Kitchen... 1 by mouth once daily    Hydrochlorothiazide 12.5 Mg Tabs (Hydrochlorothiazide) .Marland Kitchen... Take 1 as needed for edema  BP today: 110/72 Prior BP: 118/78 (04/26/2010)  Labs Reviewed: K+: 4.2 (04/18/2010) Creat: : 0.66 (04/18/2010)   Chol: 217 (03/18/2009)   HDL: 57.50 (03/18/2009)   TG: 104.0 (03/18/2009)  Problem # 4:  DYSLIPIDEMIA (ICD-272.4)  diet controlled + fish oil  Labs Reviewed: SGOT: 24 (02/13/2009)   SGPT: 12 (02/13/2009)   HDL:57.50 (03/18/2009)  Chol:217 (03/18/2009)  Trig:104.0 (03/18/2009)  Complete Medication List: 1)  Proair Hfa 108 (90 Base) Mcg/act Aers (Albuterol sulfate) .... Two puffs up to four times per day as needed 2)  Flonase 50 Mcg/act Susp (Fluticasone propionate) .... Two sprays once daily 3)  Loratadine 10 Mg Tabs (Loratadine) .Marland Kitchen.. 1 by mouth once daily x 2 weeks, then as needed for allergy symptoms 4)   Oxcarbazepine 150 Mg Tabs (Oxcarbazepine) .... Take 1 1/2 two times a day 5)  Glipizide 5 Mg Tabs (Glipizide) .... Take 1/2 tab by mouth  every morning for sugar over 125 6)  Losartan Potassium 50 Mg Tabs (Losartan potassium) .Marland Kitchen.. 1 by mouth once daily 7)  Hydrochlorothiazide 12.5 Mg Tabs (Hydrochlorothiazide) .... Take 1 as needed for edema 8)  Klor-con M20 20 Meq Cr-tabs (Potassium chloride crys cr) .... Take 1 by mouth once daily as needed (with hydrocholrthizde for edema) 9)  Aspirin 81 Mg Tabs (Aspirin) .Marland Kitchen.. 1 by mouth once daily 10)  Folic Acid 400 Mcg Tabs (Folic acid) .... Take 1 by mouth once daily 11)  Vitamin B-12 100 Mcg Tabs (Cyanocobalamin) .Marland Kitchen.. 1 by mouth once daily 12)  Multivitamins Caps (Multiple vitamin) .Marland Kitchen.. 1 by mouth once daily 13)  Vitamin D3 1000 Unit Tabs (Cholecalciferol) .Marland Kitchen.. 1 by mouth once daily 14)  Calcium 500 Mg Tabs (Calcium carbonate) .Marland Kitchen.. 1 by mouth once daily 15)  Sussunex (probiotic)  .... Take 1 by mouth once daily 16)  Tylenol Ex St Arthritis Pain 500 Mg Tabs (Acetaminophen) .... Take 1 by mouth at bedtime 17)  Famotidine 20 Mg Tabs (Famotidine) .Marland Kitchen.. 1 by mouth once daily 18)  Metformin Hcl 500 Mg Xr24h-tab (Metformin hcl) .Marland Kitchen.. 1 by mouth once daily 19)  Docusate Sodium 100 Mg Tabs (Docusate sodium) .Marland Kitchen.. 1 by mouth two times a day 20)  Ensure Liqd (Nutritional supplements) .... Once daily  Patient Instructions: 1)  it was good to see you today. 2)  hospitalization tests and labs reviewed at length today - 3)  continue diet as discussed and try Miralax in place of metamucil to reduce gas 4)  it is important that you continue to work on losing weight - monitor your diet and consume fewer calories such as less carbohydrates (sugar) and less fat. you also need to increase your physical activity level - start by walking for 10-20 minutes 3 times per week and work up to 30 minutes 4-5 times each week.  5)  Please schedule a follow-up appointment in 3 months to  monitor blood pressure  and diabetes, sooner if problems.    Orders Added: 1)  Est. Patient Level IV [16109]

## 2010-07-27 NOTE — Progress Notes (Signed)
Summary: Cough  Phone Note Call from Patient Call back at Home Phone [phone]   Caller: Patient Summary of Call: Pt called stating that cough has not resloved, she said cough can be productive with clear mucus but is mostly dry. [name] has been taking allergy and GERD medications as prescribed but with no relief. Pt is requesting liquid cough syrup to help soothe irritated throat and suppress cough. Initial call taken by: Margaret Pyle, CMA,  January 28, 2010 12:03 PM  Follow-up for Phone Call        can call in phenergan with codiene (see EMR  below - listed historical until you call in - thx) - if still coughing, needs ROV - also CONTINUE tessalon and allg + reflux meds as initially discussed and ongoing Follow-up by: Newt Lukes MD,  January 28, 2010 12:21 PM  Additional Follow-up for Phone Call Additional follow up Details #1::        Pt informed, Rx called into pharmacy to Brian/pharmacist Additional Follow-up by: Margaret Pyle, CMA,  January 28, 2010 1:13 PM    New/Updated Medications: PROMETHAZINE-CODEINE 6.25-10 MG/5ML SYRP (PROMETHAZINE-CODEINE) 5 cc by mouth every 4 hours as needed for cough symptoms Prescriptions: PROMETHAZINE-CODEINE 6.25-10 MG/5ML SYRP (PROMETHAZINE-CODEINE) 5 cc by mouth every 4 hours as needed for cough symptoms  #200cc x 0   Entered and Authorized by:   Margaret Pyle, CMA   Signed by:   Margaret Pyle, CMA on 01/28/2010   Method used:   Telephoned to ...       [facility]  [address] [phone]* (retail)       [address] [place], [place]  [id number]       Ph: [phone] or [phone]       Fax: [phone]   RxID:   [id number] PROMETHAZINE-CODEINE 6.25-10 MG/5ML SYRP (PROMETHAZINE-CODEINE) 5 cc by mouth every 4 hours as needed for cough symptoms  #200cc x 0   Entered and Authorized by:   Newt Lukes MD   Signed by:   Newt Lukes MD on 01/28/2010  Method used:   Historical   RxID:   [id number]

## 2010-07-27 NOTE — Letter (Signed)
Summary: [facility]  [facility]N/[facility]   Imported By: Lester Artesia 05/10/2010 10:43:56  _____________________________________________________________________  External Attachment:    Type:   Image     Comment:   External Document

## 2010-07-27 NOTE — Assessment & Plan Note (Signed)
Summary: cough/cb   Visit Type:  Initial Consult Copy to:  Newt Lukes MD Primary Cyrilla Durkin/Referring Cassian Torelli:  Newt Lukes MD  CC:  Pulmonary consult for cough.  History of Present Illness: 70 yo female with chronic cough.  She developed a sinus infection in July.  She was treated with anti-histamines and antibiotics.  She has noticed a cough since then.  She typical gets a cough and sinus problems with the change of the seasons.  This was especially bad this past Spring into Summer.  She thinks she has seasonal allergies, but she has never had allergy testing before.    Her cough occurs day or night.  She brings up clear sputum and denies hemoptysis.  She does get a runny nose, and feels her sinuses are congested.  She has also been getting watery eyes, and has been feeling hoarse.  She feels like there is a heaviness in her throat.  She has not had fever, but gets winded with strenuous activity.  There is no prior history of asthma.  She has not been using inhalers for her breathing, and does not recall having breathing tests previously.  She does get frequent dizziness, and ringing in her ear.  She was seen by ENT previously.  She had pneumonia years ago, but denies any history of TB.  She started smoking at age 59, smoke 1/4 pack per day, and quit smoking at age 50.  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  She if from [place], but also lived in [place].  She has not had recent travel.  She denies any recent sick exposures, and has not had exposure to animals.  I have reviewed her previous chest xrays and CT chest from 2000 through 2010.  These have all been unremarkable.  Current Medications (verified): 1)  Oxcarbazepine 150 Mg Tabs (Oxcarbazepine) .... Take 1 1/2 Two Times A Day 2)  Glipizide 5 Mg Tabs (Glipizide) .... Take 1/2 Tab By Mouth  Every Morning For Sugar Over 125 3)  Diovan 160 Mg Tabs (Valsartan) .Marland Kitchen.. 1 By Mouth At Bedtime 4)  Folic  Acid 400 Mcg Tabs (Folic Acid) .... Take 1 By Mouth Once Daily 5)  Hydrochlorothiazide 12.5 Mg Tabs (Hydrochlorothiazide) .... Take 1 As Needed For Edema 6)  Klor-Con M20 20 Meq Cr-Tabs (Potassium Chloride Crys Cr) .... Take 1 By Mouth Once Daily As Needed (With Hydrocholrthizde For Edema) 7)  Aspirin 81 Mg Tabs (Aspirin) .Marland Kitchen.. 1 By Mouth Once Daily 8)  Vitamin B-12 100 Mcg Tabs (Cyanocobalamin) .Marland Kitchen.. 1 By Mouth Once Daily 9)  Multivitamins  Caps (Multiple Vitamin) .Marland Kitchen.. 1 By Mouth Once Daily 10)  Calcium 500 Mg Tabs (Calcium Carbonate) .Marland Kitchen.. 1 By Mouth Once Daily 11)  Triple Boost- Non Caffeine 12)  Fish Oil 1000 Mg Caps (Omega-3 Fatty Acids) .Marland Kitchen.. 1 By Mouth Two Times A Day 13)  Tylenol Pm Extra Strength 500-25 Mg Tabs (Diphenhydramine-Apap (Sleep)) .Marland Kitchen.. 1 By Mouth At Bedtime As Needed 14)  Vitamin D3 1000 Unit Tabs (Cholecalciferol) .Marland Kitchen.. 1 By Mouth Once Daily 15)  Sussunex (Probiotic) .... Take 1 By Mouth Once Daily 16)  Tessalon 200 Mg Caps (Benzonatate) .Marland Kitchen.. 1 By Mouth Three Times A Day X 5 Days, Then As Needed For Cough 17)  Loratadine 10 Mg Tabs (Loratadine) .Marland Kitchen.. 1 By Mouth Once Daily X 2 Weeks, Then As Needed For Allergy Symptoms 18)  Pepcid 20 Mg Tabs (Famotidine) .Marland Kitchen.. 1 By Mouth Once Daily X 2 Weeks,  Then As Needed For Reflux Symptoms 19)  Nasonex 50 Mcg/act Susp (Mometasone Furoate) .... 2 Sprays Each Nostril  Every Morning 20)  Promethazine-Codeine 6.25-10 Mg/41ml Syrp (Promethazine-Codeine) .... 5 Cc By Mouth Every 4 Hours As Needed For Cough Symptoms  Allergies (verified): 1)  ! Penicillin  Past History:  Past Medical History: CHEST PAIN hx- cath 07/2004, [facility]: no sig CAD Diabetes mellitus, type II GERD Hypertension Seizure disorder dyslipidemia OSA - CPAP depression/anxiety osteoarthritis, esp shoulder pain  MD roster: ortho Shelle Iron E[facility] -Pollyann Kennedy neuro Thad Ranger - cards- [facility] -   pulm - Sood  Past Surgical History: Reviewed history from 04/13/2009 and no  changes required. right TKR October 2009 - [facility] Cholecystectomy Hysterectomy (partial 1970's) appendectomy Tonsillectomy adenoidectomy as youth.   Left shoulder injury status post fall in 2008.   Family History: Reviewed history from 03/18/2009 and no changes required. Family History of Arthritis (mother & grandmother) Family History Diabetes 1st degree relative (grandmother) Heart disease (father)  Social History: Reviewed history from 03/18/2009 and no changes required. Former Smoker, quit 25 years ago married, lives with spouse retired Buyer, retail  Review of Systems       The patient complains of shortness of breath with activity, shortness of breath at rest, productive cough, acid heartburn, difficulty swallowing, headaches, nasal congestion/difficulty breathing through nose, sneezing, itching, and joint stiffness or pain.  The patient denies non-productive cough, coughing up blood, chest pain, irregular heartbeats, indigestion, loss of appetite, weight change, abdominal pain, sore throat, tooth/dental problems, ear ache, anxiety, depression, hand/feet swelling, rash, change in color of mucus, and fever.    Vital Signs:  Patient profile:   70 year old female Height:      66 inches (167.64 cm) Weight:      175 pounds (79.55 kg) BMI:     28.35 O2 Sat:      97 % on Room air Temp:     98.2 degrees F (36.78 degrees C) oral Pulse rate:   78 / minute BP sitting:   122 / 72  (left arm) Cuff size:   regular  Vitals Entered By: Michel Bickers CMA (March 25, 2010 3:34 PM)  O2 Sat at Rest %:  97 O2 Flow:  Room air  Physical Exam  General:  alert, well-developed, well-nourished, and cooperative to examination.    Ears:  TMs intact and clear with normal canals Nose:  clear nasal discharge, no tenderness Mouth:  MP 3, no exudate Neck:  no JVD.   Chest Wall:  no deformities noted Lungs:  clear bilaterally to auscultation and percussion Heart:   regular rate and rhythm, S1, S2 without murmurs, rubs, gallops, or clicks Abdomen:  bowel sounds positive; abdomen soft and non-tender without masses, or organomegaly Msk:  no deformity or scoliosis noted with normal posture Pulses:  pulses normal Extremities:  no clubbing, cyanosis, edema, or deformity noted Neurologic:  normal CN II-XII and strength normal.   Cervical Nodes:  No significant adenopathy.  Psych:  alert and cooperative; normal mood and affect; normal attention span and concentration   Impression & Recommendations:  Problem # 1:  COUGH (ICD-786.2) She has a cough present for more than 3 months.  She has a prior history of tobacco abuse.  She has symptoms suggestive of rhinitis with post-nasal drip.  She also has symptoms suggestive of asthma, but this is less convincing.  To further assess this I will have her get a chest xray today.  I will also  arrange for pulmonary function testing.  I have asked her to use nasal irrigation and fluticasone nasal spray on a daily basis.  I have also asked her to try using proair as needed until her pulmonary function testing is reviewed.  She may also need additional allergy testing, and I have asked her to continue with as needed loratidine for now.  Medications Added to Medication List This Visit: 1)  Proair Hfa 108 (90 Base) Mcg/act Aers (Albuterol sulfate) .... Two puffs up to four times per day as needed 2)  Flonase 50 Mcg/act Susp (Fluticasone propionate) .... Two sprays once daily  Complete Medication List: 1)  Proair Hfa 108 (90 Base) Mcg/act Aers (Albuterol sulfate) .... Two puffs up to four times per day as needed 2)  Flonase 50 Mcg/act Susp (Fluticasone propionate) .... Two sprays once daily 3)  Loratadine 10 Mg Tabs (Loratadine) .Marland Kitchen.. 1 by mouth once daily x 2 weeks, then as needed for allergy symptoms 4)  Oxcarbazepine 150 Mg Tabs (Oxcarbazepine) .... Take 1 1/2 two times a day 5)  Glipizide 5 Mg Tabs (Glipizide) .... Take 1/2  tab by mouth  every morning for sugar over 125 6)  Diovan 160 Mg Tabs (Valsartan) .Marland Kitchen.. 1 by mouth at bedtime 7)  Hydrochlorothiazide 12.5 Mg Tabs (Hydrochlorothiazide) .... Take 1 as needed for edema 8)  Klor-con M20 20 Meq Cr-tabs (Potassium chloride crys cr) .... Take 1 by mouth once daily as needed (with hydrocholrthizde for edema) 9)  Aspirin 81 Mg Tabs (Aspirin) .Marland Kitchen.. 1 by mouth once daily 10)  Folic Acid 400 Mcg Tabs (Folic acid) .... Take 1 by mouth once daily 11)  Vitamin B-12 100 Mcg Tabs (Cyanocobalamin) .Marland Kitchen.. 1 by mouth once daily 12)  Multivitamins Caps (Multiple vitamin) .Marland Kitchen.. 1 by mouth once daily 13)  Vitamin D3 1000 Unit Tabs (Cholecalciferol) .Marland Kitchen.. 1 by mouth once daily 14)  Calcium 500 Mg Tabs (Calcium carbonate) .Marland Kitchen.. 1 by mouth once daily 15)  Triple Boost- Non Caffeine  16)  Tylenol Pm Extra Strength 500-25 Mg Tabs (Diphenhydramine-apap (sleep)) .Marland Kitchen.. 1 by mouth at bedtime as needed 17)  Sussunex (probiotic)  .... Take 1 by mouth once daily 18)  Tessalon 200 Mg Caps (Benzonatate) .Marland Kitchen.. 1 by mouth three times a day x 5 days, then as needed for cough 19)  Pepcid 20 Mg Tabs (Famotidine) .Marland Kitchen.. 1 by mouth once daily x 2 weeks, then as needed for reflux symptoms 20)  Promethazine-codeine 6.25-10 Mg/90ml Syrp (Promethazine-codeine) .... 5 cc by mouth every 4 hours as needed for cough symptoms  Other Orders: Consultation Level IV (16109) Full Pulmonary Function Test (PFT) T-2 View CXR (71020TC)  Patient Instructions: 1)  Chest xray today 2)  Will schedule breathing test (PFT) 3)  Nasal irrigation once daily  4)  Flonase two sprays once daily 5)  Proair two puffs up to four times per day as needed for cough, wheeze, congestion, or shortness of breath 6)  Follow up in 4 weeks Prescriptions: PROAIR HFA 108 (90 BASE) MCG/ACT AERS (ALBUTEROL SULFATE) two puffs up to four times per day as needed  #1 x 6   Entered and Authorized by:   Coralyn Helling MD   Signed by:   Coralyn Helling MD on  03/25/2010   Method used:   [facility]        [facility]  [address] [phone][facility] (retail)       [address]       [place], [place]  [id number]       Ph: [phone] or [phone]       Fax: [phone]   RxID:   [id number] FLONASE 50 MCG/ACT SUSP (FLUTICASONE PROPIONATE) two sprays once daily  #1 x 6   Entered and Authorized by:   Coralyn Helling MD   Signed by:   Coralyn Helling MD on 03/25/2010   Method used:   [facility]        [facility] [phone]* (retail)       [address]       [place], [place]  [address]       Ph: [phone] or [phone]       Fax: [phone]   RxID:   [id number]

## 2010-07-27 NOTE — Letter (Signed)
Summary: [facility]  [facility]   Imported By: Sherian Rein 09/04/2009 07:39:49  _____________________________________________________________________  External Attachment:    Type:   Image     Comment:   External Document

## 2010-07-27 NOTE — Miscellaneous (Signed)
Summary: Orders Update pft charges  Clinical Lists Changes  Orders: Added new Service order of Lung Volumes (94240) - Signed Added new Service order of Carbon Monoxide diffusing w/capacity (94720) - Signed Added new Service order of Spirometry (Pre & Post) (94060) - Signed 

## 2010-07-27 NOTE — Progress Notes (Signed)
Summary: klor con  Phone Note Refill Request Message from:  Fax from Pharmacy on Nov 18, 2009 8:04 AM  Refills Requested: Medication #1:  KLOR-CON M20 20 MEQ CR-TABS take 1 by mouth once daily as needed (with hydrocholrthizde for edema)  Method Requested: Electronic Initial call taken by: Orlan Leavens,  Nov 18, 2009 8:04 AM    Prescriptions: KLOR-CON M20 20 MEQ CR-TABS (POTASSIUM CHLORIDE CRYS CR) take 1 by mouth once daily as needed (with hydrocholrthizde for edema)  #30 x 3   Entered by:   Orlan Leavens   Authorized by:   Newt Lukes MD   Signed by:   Orlan Leavens on 11/18/2009   Method used:   [facility]        [facility]  [address] [phone]* (retail)       [address]       [place], [place]  [address]       Ph: [phone] or [phone]       Fax: [phone]   RxID:   [id number]

## 2010-07-27 NOTE — Assessment & Plan Note (Signed)
Summary: 3-4 MTH FU---STC   Vital Signs:  Patient profile:   71 year old female Height:      66 inches (167.64 cm) Weight:      173.8 pounds (79 kg) O2 Sat:      96 % on Room air Temp:     98.2 degrees F (36.78 degrees C) oral Pulse rate:   89 / minute BP sitting:   132 / 70  (left arm) Cuff size:   regular  Vitals Entered By: Orlan Leavens RMA (April 07, 2010 10:47 AM)  O2 Flow:  Room air CC: 3 MONTH FOLLOW-UP Is Patient Diabetic? Yes Did you bring your meter with you today? Yes Pain Assessment Patient in pain? no        Primary Care Provider:  Newt Lukes MD  CC:  3 MONTH FOLLOW-UP.  History of Present Illness: here for 3 mo  1) HTN - reports compliance with ongoing medical treatment and no changes in medication dose or frequency. c/o dizziness after taking AM pills she feels is improved as she now takes ths diovan before bed (pm)  2) DM2 -  reports compliance with ongoing medical treatment and no changes in medication dose or frequency. checks sugars every morning  - if over 125, takes 1/2 tab glipizide; whole tab if >150 - this occurs 3-4 d/wk - no hypoglycemia symptoms   3) c/o OA pain  - R knee and both shoulders affected - stopped ultracet on becuase not working- still taking tylenol arthritis with incomplete relief of symptoms-  s/p steroid shot due to flare and fall 03/2010  4) gas/abd bloating - improved with probiotics - food dairy reviewed  5) continued cough - now seeing pulm for same not resolved with H2B a nd antihistamine and antitussives now on asthma meds and planns for PFTS this month  Clinical Review Panels:  Immunizations   Last Flu Vaccine:  Historical given @ [facility] (04/01/2010)  Lipid Management   Cholesterol:  217 (03/18/2009)   HDL (good cholesterol):  57.50 (03/18/2009)  Diabetes Management   HgBA1C:  7.1 (01/07/2010)   Creatinine:  0.6 (03/18/2009)   Last Flu Vaccine:  Historical given @ [facility] (04/01/2010)  CBC  WBC:  7.3 (03/16/2009)   RBC:  4.16 (03/16/2009)   Hgb:  12.9 (03/16/2009)   Hct:  37.7 (03/16/2009)   Platelets:  228 (03/16/2009)   MCV  90.6 (03/16/2009)   RDW  12.6 (03/16/2009)   PMN:  50 (03/16/2009)   Monos:  9 (03/16/2009)   Eosinophils:  0.1 (03/16/2009)  Complete Metabolic Panel   Glucose:  125 (03/18/2009)   Sodium:  139 (03/18/2009)   Potassium:  5.0 (03/18/2009)   Chloride:  99 (03/18/2009)   CO2:  32 (03/18/2009)   BUN:  7 (03/18/2009)   Creatinine:  0.6 (03/18/2009)   Albumin:  4.5 (02/13/2009)   Total Protein:  7.4 (02/13/2009)   Calcium:  9.8 (03/18/2009)   Total Bili:  0.4 (02/13/2009)   Alk Phos:  86 (02/13/2009)   SGPT (ALT):  12 (02/13/2009)   SGOT (AST):  24 (02/13/2009)   Current Medications (verified): 1)  Proair Hfa 108 (90 Base) Mcg/act Aers (Albuterol Sulfate) .... Two Puffs Up To Four Times Per Day As Needed 2)  Flonase 50 Mcg/act Susp (Fluticasone Propionate) .... Two Sprays Once Daily 3)  Loratadine 10 Mg Tabs (Loratadine) .Marland Kitchen.. 1 By Mouth Once Daily X 2 Weeks, Then As Needed For Allergy Symptoms 4)  Oxcarbazepine 150 Mg Tabs (Oxcarbazepine) .Marland KitchenMarland KitchenMarland Kitchen  Take 1 1/2 Two Times A Day 5)  Glipizide 5 Mg Tabs (Glipizide) .... Take 1/2 Tab By Mouth  Every Morning For Sugar Over 125 6)  Diovan 160 Mg Tabs (Valsartan) .Marland Kitchen.. 1 By Mouth At Bedtime 7)  Hydrochlorothiazide 12.5 Mg Tabs (Hydrochlorothiazide) .... Take 1 As Needed For Edema 8)  Klor-Con M20 20 Meq Cr-Tabs (Potassium Chloride Crys Cr) .... Take 1 By Mouth Once Daily As Needed (With Hydrocholrthizde For Edema) 9)  Aspirin 81 Mg Tabs (Aspirin) .Marland Kitchen.. 1 By Mouth Once Daily 10)  Folic Acid 400 Mcg Tabs (Folic Acid) .... Take 1 By Mouth Once Daily 11)  Vitamin B-12 100 Mcg Tabs (Cyanocobalamin) .Marland Kitchen.. 1 By Mouth Once Daily 12)  Multivitamins  Caps (Multiple Vitamin) .Marland Kitchen.. 1 By Mouth Once Daily 13)  Vitamin D3 1000 Unit Tabs (Cholecalciferol) .Marland Kitchen.. 1 By Mouth Once Daily 14)  Calcium 500 Mg Tabs (Calcium Carbonate)  .Marland Kitchen.. 1 By Mouth Once Daily 15)  Sussunex (Probiotic) .... Take 1 By Mouth Once Daily 16)  Tylenol Ex St Arthritis Pain 500 Mg Tabs (Acetaminophen) .... Take 1 By Mouth At Bedtime  Allergies (verified): 1)  ! Penicillin  Past History:  Past Medical History: CHEST PAIN hx- cath 07/2004, [facility]: no sig CAD Diabetes mellitus, type II GERD Hypertension Seizure disorder dyslipidemia OSA - CPAP depression/anxiety osteoarthritis, esp shoulder + knee pain    MD roster: ortho Shelle Iron ENT -[facility] neuro - [facility] cards- [facility]  pulm - Sood  Review of Systems  The patient denies fever, chest pain, syncope, and headaches.    Physical Exam  General:  alert, well-developed, well-nourished, and cooperative to examination.    Lungs:  normal respiratory effort, no intercostal retractions or use of accessory muscles; normal breath sounds bilaterally - no crackles and no wheezes.    Heart:  normal rate, regular rhythm, no murmur, and no rub. BLE without edema. Psych:  Oriented X3, memory intact for recent and remote, normally interactive, good eye contact, not anxious appearing, not depressed appearing, and not agitated.      Impression & Recommendations:  Problem # 1:  DIABETES MELLITUS, TYPE II (ICD-250.00)  upward trend, by hx and a1c since 06/2009 start metformin now - mechanism and poss SE explained - pt understands poss benefit v rsisk and agrees to same - erx done ok to continue glipizide "as needed" Her updated medication list for this problem includes:    Glipizide 5 Mg Tabs (Glipizide) .Marland Kitchen... Take 1/2 tab by mouth  every morning for sugar over 125    Losartan Potassium 50 Mg Tabs (Losartan potassium) .Marland Kitchen... 1 by mouth once daily    Aspirin 81 Mg Tabs (Aspirin) .Marland Kitchen... 1 by mouth once daily    Metformin Hcl 500 Mg Xr24h-tab (Metformin hcl) .Marland Kitchen... 1 by mouth once daily  Orders: TLB-A1C / Hgb A1C (Glycohemoglobin) (83036-A1C) Prescription Created Electronically [phone])  Labs  Reviewed: Creat: 0.6 (03/18/2009)    Reviewed HgBA1c results: 7.1 (01/07/2010)  6.7 (10/07/2009)  Problem # 2:  HYPERTENSION (ICD-401.9)  not taking diovan due to cost - change to generic ARB - erx done Her updated medication list for this problem includes:    Losartan Potassium 50 Mg Tabs (Losartan potassium) .Marland Kitchen... 1 by mouth once daily    Hydrochlorothiazide 12.5 Mg Tabs (Hydrochlorothiazide) .Marland Kitchen... Take 1 as needed for edema  Orders: Prescription Created Electronically [phone])  BP today: 132/70 Prior BP: 122/72 (03/25/2010)  Labs Reviewed: K+: 5.0 (03/18/2009) Creat: : 0.6 (03/18/2009)   Chol: 217 (03/18/2009)  HDL: 57.50 (03/18/2009)   TG: 104.0 (03/18/2009)  Problem # 3:  OSTEOARTHRITIS (ICD-715.90) ultracet ineffective for pain relief cont mgmt by ortho as needed and tylenol arthirits +fish oil s/p steroid injection 03/2010 - likely cause for inc in cbgs recently - explained same to pt  Her updated medication list for this problem includes:    Aspirin 81 Mg Tabs (Aspirin) .Marland Kitchen... 1 by mouth once daily    Tylenol Ex St Arthritis Pain 500 Mg Tabs (Acetaminophen) .Marland Kitchen... Take 1 by mouth at bedtime  Problem # 4:  COUGH (ICD-786.2) s/p pulm eval for same 03/25/10 - OV reviewed: She has a cough present for more than >3 months.  She has a prior history of tobacco abuse.  She has symptoms suggestive of rhinitis with post-nasal drip.  She also has symptoms suggestive of asthma, but this is less convincing.  To further assess this I will have her get a chest xray today.  I will also arrange for pulmonary function testing.  I have asked her to use nasal irrigation and fluticasone nasal spray on a daily basis.  I have also asked her to try using proair as needed until her pulmonary function testing is reviewed.  She may also need additional allergy testing, and I have asked her to continue with as needed loratidine for now.  Complete Medication List: 1)  Proair Hfa 108 (90 Base) Mcg/act  Aers (Albuterol sulfate) .... Two puffs up to four times per day as needed 2)  Flonase 50 Mcg/act Susp (Fluticasone propionate) .... Two sprays once daily 3)  Loratadine 10 Mg Tabs (Loratadine) .Marland Kitchen.. 1 by mouth once daily x 2 weeks, then as needed for allergy symptoms 4)  Oxcarbazepine 150 Mg Tabs (Oxcarbazepine) .... Take 1 1/2 two times a day 5)  Glipizide 5 Mg Tabs (Glipizide) .... Take 1/2 tab by mouth  every morning for sugar over 125 6)  Losartan Potassium 50 Mg Tabs (Losartan potassium) .Marland Kitchen.. 1 by mouth once daily 7)  Hydrochlorothiazide 12.5 Mg Tabs (Hydrochlorothiazide) .... Take 1 as needed for edema 8)  Klor-con M20 20 Meq Cr-tabs (Potassium chloride crys cr) .... Take 1 by mouth once daily as needed (with hydrocholrthizde for edema) 9)  Aspirin 81 Mg Tabs (Aspirin) .Marland Kitchen.. 1 by mouth once daily 10)  Folic Acid 400 Mcg Tabs (Folic acid) .... Take 1 by mouth once daily 11)  Vitamin B-12 100 Mcg Tabs (Cyanocobalamin) .Marland Kitchen.. 1 by mouth once daily 12)  Multivitamins Caps (Multiple vitamin) .Marland Kitchen.. 1 by mouth once daily 13)  Vitamin D3 1000 Unit Tabs (Cholecalciferol) .Marland Kitchen.. 1 by mouth once daily 14)  Calcium 500 Mg Tabs (Calcium carbonate) .Marland Kitchen.. 1 by mouth once daily 15)  Sussunex (probiotic)  .... Take 1 by mouth once daily 16)  Tylenol Ex St Arthritis Pain 500 Mg Tabs (Acetaminophen) .... Take 1 by mouth at bedtime 17)  Famotidine 20 Mg Tabs (Famotidine) .Marland Kitchen.. 1 by mouth once daily 18)  Metformin Hcl 500 Mg Xr24h-tab (Metformin hcl) .Marland Kitchen.. 1 by mouth once daily  Patient Instructions: 1)  it was good to see you today. 2)  test(s) ordered today - your results will be posted on the phone tree for review in 48-72 hours from the time of test completion; call [phone] and enter your 9 digit MRN (listed above on this page, just below your name); if any changes need to be made or there are abnormal results, you will be contacted directly.  3)  change diovan to generic losartan 4)  start metformin for  diabetes and famitodine for reflux 5)  your prescriptions have been electronically submitted to your pharmacy. Please take as directed. Contact our office if you believe you're having problems with the medication(s).  6)  Please schedule a follow-up appointment in 3 months to monitor blood pressure and diabetes, sooner if problems.  Prescriptions: METFORMIN HCL 500 MG XR24H-TAB (METFORMIN HCL) 1 by mouth once daily  #30 x 6   Entered and Authorized by:   Newt Lukes MD   Signed by:   Newt Lukes MD on 04/07/2010   Method used:   [facility]        [facility] [phone]* (retail)       [address]       [place], [place]  [address]       Ph: [phone] or [phone]       Fax: [phone]   RxID:   [id number] FAMOTIDINE 20 MG TABS (FAMOTIDINE) 1 by mouth once daily  #30 x 6   Entered and Authorized by:   Newt Lukes MD   Signed by:   Newt Lukes MD on 04/07/2010   Method used:   [facility]        [facility] [phone]* (retail)       [address]       [place], [place]  [address]       Ph: [phone] or [phone]       Fax: [phone]   RxID:   [id number] LOSARTAN POTASSIUM 50 MG TABS (LOSARTAN POTASSIUM) 1 by mouth once daily  #30 x 6   Entered and Authorized by:   Newt Lukes MD   Signed by:   Newt Lukes MD on 04/07/2010   Method used:   [facility]        [facility] [address] [phone]* (retail)       [address]       [place], [place]  [id number]       Ph: [phone] or [phone]       Fax: [phone]   RxID:   [id number]    Immunization History:  Influenza Immunization History:    Influenza:  historical given @ [facility] (04/01/2010)

## 2010-07-27 NOTE — Assessment & Plan Note (Signed)
Summary: 3 MTH FU  STC   Vital Signs:  Patient profile:   70 year old female Height:      66 inches (167.64 cm) Weight:      169.25 pounds (76.93 kg) O2 Sat:      96 % on Room air Temp:     97.8 degrees F (36.56 degrees C) oral Pulse rate:   67 / minute BP sitting:   112 / 80  (left arm) Cuff size:   large  Vitals Entered By: Josph Macho CMA (July 08, 2009 10:32 AM)  O2 Flow:  Room air CC: 3 month follow up/ CF Is Patient Diabetic? Yes   Primary Care Provider:  Newt Lukes MD  CC:  3 month follow up/ CF.  History of Present Illness: here for followup -  1)HTN - reports compliance with ongoing medical treatment and no changes in medication dose or frequency. c/o dizziness after taking AM pills she feels is related to current therapy.   2) DM2 -  reports compliance with ongoing medical treatment and no changes in medication dose or frequency. c/o quick drop in sugars a/w dizzy feeling related to current therapy but no specific hypoglycemia. ?if needs to even take her pills  3) tinnitus -has seen ENT for same - symptoms improved since reducing caffiene intake -  4) c/o inc OA pain with cold temps - taking tylenol arthritis with incomplete relief of symptoms- ?if anything herbal to add? has tried glucosamine chondrontin w/o improvment  Clinical Review Panels:  Diabetes Management   HgBA1C:  6.8 (03/18/2009)   Creatinine:  0.6 (03/18/2009)   Last Flu Vaccine:  Historical (02/25/2009)   Current Medications (verified): 1)  Oxcarbazepine 150 Mg Tabs (Oxcarbazepine) .... Take 1 1/2 Two Times A Day 2)  Glipizide 5 Mg Tabs (Glipizide) .... Take 1 By Mouth Qd 3)  Folic Acid 400 Mcg Tabs (Folic Acid) .... Take 1 By Mouth Qd 4)  Klor-Con M20 20 Meq Cr-Tabs (Potassium Chloride Crys Cr) .... Take 1 By Mouth Qd 5)  Hydrochlorothiazide 12.5 Mg Tabs (Hydrochlorothiazide) .... Take 1 As Needed For Edema 6)  Diovan 160 Mg Tabs (Valsartan) .Marland Kitchen.. 1 By Mouth Once Daily 7)   Aspirin 81 Mg Tabs (Aspirin) .Marland Kitchen.. 1 By Mouth Once Daily 8)  Vitamin B-12 1000 Mcg Tabs (Cyanocobalamin) .Marland Kitchen.. 1 By Mouth Once Daily 9)  Multivitamins  Caps (Multiple Vitamin) .Marland Kitchen.. 1 By Mouth Once Daily 10)  Calcium 500 Mg Tabs (Calcium Carbonate) .Marland Kitchen.. 1 By Mouth Once Daily 11)  Bee Pollen .Marland Kitchen.. 1 Two Times A Day 12)  Flax Meal 13)  Triple Boost- Non Caffeine  Allergies (verified): 1)  ! Penicillin  Past History:  Past Medical History: CHEST PAIN (ICD-786.50) cath 07/2004 [facility] no sig CAD HYPERTENSION (ICD-401.9) DYSLIPIDEMIA (ICD-272.4) SLEEP APNEA, OBSTRUCTIVE (ICD-327.23) OSTEOARTHRITIS (ICD-715.90) SHOULDER PAIN (ICD-719.41) GERD (ICD-530.81) FAMILY HISTORY DIABETES 1ST DEGREE RELATIVE (ICD-V18.0) SPECIAL SCREENING FOR MALIGNANT NEOPLASMS COLON (ICD-V76.51) VERTIGO (ICD-780.4) DEPRESSION (ICD-311) IRRITABLE BOWEL SYNDROME (ICD-564.1) INSOMNIA (ICD-780.52) SEIZURE DISORDER (ICD-780.39) NUMBNESS, HAND (ICD-782.0) DIABETES MELLITUS, TYPE II (ICD-250.00) OSA - CPAP  MD rooster: ortho - [facility] -[facility] neuro - Reynolds - cards- [facility] -  Review of Systems  The patient denies fever, weight loss, chest pain, syncope, and headaches.    Physical Exam  General:  alert, well-developed, well-nourished, and cooperative to examination.    Lungs:  normal respiratory effort, no intercostal retractions or use of accessory muscles; normal breath sounds bilaterally - no crackles and no wheezes.  Heart:  normal rate, regular rhythm, no murmur, and no rub. BLE without edema. Psych:  Oriented X3, memory intact for recent and remote, normally interactive, good eye contact, not anxious appearing, not depressed appearing, and not agitated.      Impression & Recommendations:  Problem # 1:  HYPERTENSION (ICD-401.9)  Her updated medication list for this problem includes:    Hydrochlorothiazide 12.5 Mg Tabs (Hydrochlorothiazide) .Marland Kitchen... Take 1 as needed for edema    Diovan 160 Mg  Tabs (Valsartan) .Marland Kitchen... 1 by mouth at bedtime  BP today: 112/80 Prior BP: 98/62 (05/25/2009)  Labs Reviewed: K+: 5.0 (03/18/2009) Creat: : 0.6 (03/18/2009)   Chol: 217 (03/18/2009)   HDL: 57.50 (03/18/2009)   TG: 104.0 (03/18/2009)  Problem # 2:  DIABETES MELLITUS, TYPE II (ICD-250.00)  Her updated medication list for this problem includes:    Glipizide 5 Mg Tabs (Glipizide) .Marland Kitchen... Take 1/2 tab by mouth once daily - hold for now    Diovan 160 Mg Tabs (Valsartan) .Marland Kitchen... 1 by mouth at bedtime    Aspirin 81 Mg Tabs (Aspirin) .Marland Kitchen... 1 by mouth once daily  Labs Reviewed: Creat: 0.6 (03/18/2009)    Reviewed HgBA1c results: 6.8 (03/18/2009)  Orders: TLB-A1C / Hgb A1C (Glycohemoglobin) (83036-A1C)  Problem # 3:  DYSLIPIDEMIA (ICD-272.4)  Labs Reviewed: SGOT: 24 (02/13/2009)   SGPT: 12 (02/13/2009)   HDL:57.50 (03/18/2009)  Chol:217 (03/18/2009)  Trig:104.0 (03/18/2009)  Problem # 4:  OSTEOARTHRITIS (ICD-715.90)  add fish oil - also as needed ultracet to try Her updated medication list for this problem includes:    Aspirin 81 Mg Tabs (Aspirin) .Marland Kitchen... 1 by mouth once daily    Ultracet 37.5-325 Mg Tabs (Tramadol-acetaminophen) .Marland Kitchen... 1 by mouth every 4hours as needed for moderate pains  Orders: Prescription Created Electronically [phone])  Complete Medication List: 1)  Oxcarbazepine 150 Mg Tabs (Oxcarbazepine) .... Take 1 1/2 two times a day 2)  Glipizide 5 Mg Tabs (Glipizide) .... Take 1/2 tab by mouth once daily - hold for now 3)  Folic Acid 400 Mcg Tabs (Folic acid) .... Take 1 by mouth qd 4)  Klor-con M20 20 Meq Cr-tabs (Potassium chloride crys cr) .... Take 1 by mouth qd 5)  Hydrochlorothiazide 12.5 Mg Tabs (Hydrochlorothiazide) .... Take 1 as needed for edema 6)  Diovan 160 Mg Tabs (Valsartan) .Marland Kitchen.. 1 by mouth at bedtime 7)  Aspirin 81 Mg Tabs (Aspirin) .Marland Kitchen.. 1 by mouth once daily 8)  Vitamin B-12 100 Mcg Tabs (Cyanocobalamin) .Marland Kitchen.. 1 by mouth once daily 9)  Multivitamins Caps  (Multiple vitamin) .Marland Kitchen.. 1 by mouth once daily 10)  Calcium 500 Mg Tabs (Calcium carbonate) .Marland Kitchen.. 1 by mouth once daily 11)  Bee Pollen  .Marland Kitchen.. 1 two times a day 12)  Flax Meal  13)  Triple Boost- Non Caffeine  14)  Fish Oil 1000 Mg Caps (Omega-3 fatty acids) .Marland Kitchen.. 1 by mouth two times a day 15)  Ultracet 37.5-325 Mg Tabs (Tramadol-acetaminophen) .Marland Kitchen.. 1 by mouth every 4hours as needed for moderate pains  Patient Instructions: 1)  it was good to see you today.  2)  medications adjusted as discussed (as listed below) - add fish oil for arthritis and try ultracet as needed for pain flares; hold diabetes pill until further notice -  3)  test(s) ordered today - your results will be posted on the phone tree for review in 48-72 hours from the time of test completion; call [phone] and enter your 9 digit MRN (listed above on this page,  just below your name); if any changes need to be made or there are abnormal results, you will be contacted directly.  4)  Please schedule a follow-up appointment in 3-4 months, sooner if problems.  Prescriptions: ULTRACET 37.5-325 MG TABS (TRAMADOL-ACETAMINOPHEN) 1 by mouth every 4hours as needed for moderate pains  #30 x 0   Entered and Authorized by:   Newt Lukes MD   Signed by:   Newt Lukes MD on 07/08/2009   Method used:   Electronically to        [facility]  [address] [phone]* (retail)       [address]       [place], [place]  [id number]       Ph: [phone] or [phone]       Fax: [phone]   RxID:   [id number]

## 2010-07-27 NOTE — Assessment & Plan Note (Signed)
Summary: rov/ mbw   Visit Type:  Follow-up Copy to:  Newt Lukes MD Primary Provider/Referring Provider:  Newt Lukes MD  CC:  Follow-up with PFT's today....  History of Present Illness: 70 yo female with chronic cough likely from post-nasal drip and possible asthma.  She was hospitalized for diverticulitis last week.  She has two more days of antibiotics to take, but otherwise has improved.    Her cough has improved since her last visit.  She has been using nasal irrigation and flonase on a regular basis.  She was suprised at how much congestion she had.  Since she has started her sinus regimen she does not have the problem with tinnitus as much.  She denies sore throat, chest pain, wheeze, or sputum.  She was using her proair three times a day, but more because she thought she was supposed to.  She is not using as much now.  She thinks proair may have helped some.  Her pulmonary function test today was normal.    CXR  Procedure date:  03/25/2010  Findings:      CHEST - 2 VIEW   Comparison: Chest 03/16/2009.   Findings: The lungs are clear.  Heart size is normal.  No pleural effusion or focal bony abnormality.   IMPRESSION: Negative chest.   Current Medications (verified): 1)  Proair Hfa 108 (90 Base) Mcg/act Aers (Albuterol Sulfate) .... Two Puffs Up To Four Times Per Day As Needed 2)  Flonase 50 Mcg/act Susp (Fluticasone Propionate) .... Two Sprays Once Daily 3)  Loratadine 10 Mg Tabs (Loratadine) .Marland Kitchen.. 1 By Mouth Once Daily X 2 Weeks, Then As Needed For Allergy Symptoms 4)  Oxcarbazepine 150 Mg Tabs (Oxcarbazepine) .... Take 1 1/2 Two Times A Day 5)  Glipizide 5 Mg Tabs (Glipizide) .... Take 1/2 Tab By Mouth  Every Morning For Sugar Over 125 6)  Losartan Potassium 50 Mg Tabs (Losartan Potassium) .Marland Kitchen.. 1 By Mouth Once Daily 7)  Hydrochlorothiazide 12.5 Mg Tabs (Hydrochlorothiazide) .... Take 1 As Needed For Edema 8)  Klor-Con M20 20 Meq Cr-Tabs (Potassium  Chloride Crys Cr) .... Take 1 By Mouth Once Daily As Needed (With Hydrocholrthizde For Edema) 9)  Aspirin 81 Mg Tabs (Aspirin) .Marland Kitchen.. 1 By Mouth Once Daily 10)  Folic Acid 400 Mcg Tabs (Folic Acid) .... Take 1 By Mouth Once Daily 11)  Vitamin B-12 100 Mcg Tabs (Cyanocobalamin) .Marland Kitchen.. 1 By Mouth Once Daily 12)  Multivitamins  Caps (Multiple Vitamin) .Marland Kitchen.. 1 By Mouth Once Daily 13)  Vitamin D3 1000 Unit Tabs (Cholecalciferol) .Marland Kitchen.. 1 By Mouth Once Daily 14)  Calcium 500 Mg Tabs (Calcium Carbonate) .Marland Kitchen.. 1 By Mouth Once Daily 15)  Sussunex (Probiotic) .... Take 1 By Mouth Once Daily 16)  Tylenol Ex St Arthritis Pain 500 Mg Tabs (Acetaminophen) .... Take 1 By Mouth At Bedtime 17)  Famotidine 20 Mg Tabs (Famotidine) .Marland Kitchen.. 1 By Mouth Once Daily 18)  Metformin Hcl 500 Mg Xr24h-Tab (Metformin Hcl) .Marland Kitchen.. 1 By Mouth Once Daily 19)  Ciprofloxacin Hcl 500 Mg Tabs (Ciprofloxacin Hcl) .Marland Kitchen.. 1 By Mouth Two Times A Day 20)  Metronidazole 500 Mg Tabs (Metronidazole) .Marland Kitchen.. 1 By Mouth Four Times Daily For 10 Days 21)  Docusate Sodium 100 Mg Tabs (Docusate Sodium) .Marland Kitchen.. 1 By Mouth Two Times A Day 22)  Ensure  Liqd (Nutritional Supplements) .... Once Daily  Allergies (verified): 1)  ! Penicillin  Past History:  Past Medical History: CHEST PAIN hx- cath 07/2004, [facility]: no  sig CAD Diabetes mellitus, type II GERD Hypertension Seizure disorder dyslipidemia OSA - CPAP depression/anxiety osteoarthritis, esp shoulder + knee pain Cough likely from postnasal drip      - Normal chest xray 03/25/10      - PFT 04/26/10 FEV1 2.01(101%), FEV1% 81, TLC 3.84(84%), DLCO 85%, no BD Diverticulosis with acute diverticulitis Oct. 2011    MD roster: [facility] -Pollyann Kennedy neuro - Thad Ranger cards- [facility]  pulm - Craige Cotta  Past Surgical History: Reviewed history from 04/13/2009 and no changes required. right TKR October 2009 - [facility] Cholecystectomy Hysterectomy (partial 1970's) appendectomy Tonsillectomy adenoidectomy as  youth.   Left shoulder injury status post fall in 2008.   Vital Signs:  Patient profile:   70 year old female Height:      63 inches (160.02 cm) Weight:      170 pounds (77.27 kg) BMI:     30.22 O2 Sat:      97 % on Room air Temp:     98.1 degrees F (36.72 degrees C) oral Pulse rate:   89 / minute BP sitting:   118 / 78  (left arm) Cuff size:   regular  Vitals Entered By: Michel Bickers CMA (April 26, 2010 9:57 AM)  O2 Sat at Rest %:  97 O2 Flow:  Room air  Physical Exam  General:  alert, well-developed, well-nourished, and cooperative to examination.    Nose:  clear nasal discharge, no tenderness Mouth:  MP 3, no exudate Neck:  no JVD.   Lungs:  clear bilaterally to auscultation and percussion Heart:  regular rate and rhythm, S1, S2 without murmurs, rubs, gallops, or clicks Extremities:  no clubbing, cyanosis, edema, or deformity noted Neurologic:  normal CN II-XII and strength normal.   Cervical Nodes:  No significant adenopathy.  Psych:  alert and cooperative; normal mood and affect; normal attention span and concentration   Impression & Recommendations:  Problem # 1:  COUGH (ICD-786.2)  Her recent chest xray and pulmonary function test today are unremarkable.  She has improved with augmented sinus regimen for post-nasal drip.  Will have her continue nasal irrigation and flonase for now.  If she remains stable, she can then use flonase as needed.  She can continue as needed proair for possible asthma as she has noticed some clinical benefit.  If she requires regular inhaler use, then she will need further assessment to determine if in fact she does have asthma.  Medications Added to Medication List This Visit: 1)  Ciprofloxacin Hcl 500 Mg Tabs (Ciprofloxacin hcl) .Marland Kitchen.. 1 by mouth two times a day 2)  Metronidazole 500 Mg Tabs (Metronidazole) .Marland Kitchen.. 1 by mouth four times daily for 10 days 3)  Docusate Sodium 100 Mg Tabs (Docusate sodium) .Marland Kitchen.. 1 by mouth two times a day 4)   Ensure Liqd (Nutritional supplements) .... Once daily  Complete Medication List: 1)  Proair Hfa 108 (90 Base) Mcg/act Aers (Albuterol sulfate) .... Two puffs up to four times per day as needed 2)  Flonase 50 Mcg/act Susp (Fluticasone propionate) .... Two sprays once daily 3)  Loratadine 10 Mg Tabs (Loratadine) .Marland Kitchen.. 1 by mouth once daily x 2 weeks, then as needed for allergy symptoms 4)  Oxcarbazepine 150 Mg Tabs (Oxcarbazepine) .... Take 1 1/2 two times a day 5)  Glipizide 5 Mg Tabs (Glipizide) .... Take 1/2 tab by mouth  every morning for sugar over 125 6)  Losartan Potassium 50 Mg Tabs (Losartan potassium) .Marland Kitchen.. 1 by mouth once  daily 7)  Hydrochlorothiazide 12.5 Mg Tabs (Hydrochlorothiazide) .... Take 1 as needed for edema 8)  Klor-con M20 20 Meq Cr-tabs (Potassium chloride crys cr) .... Take 1 by mouth once daily as needed (with hydrocholrthizde for edema) 9)  Aspirin 81 Mg Tabs (Aspirin) .Marland Kitchen.. 1 by mouth once daily 10)  Folic Acid 400 Mcg Tabs (Folic acid) .... Take 1 by mouth once daily 11)  Vitamin B-12 100 Mcg Tabs (Cyanocobalamin) .Marland Kitchen.. 1 by mouth once daily 12)  Multivitamins Caps (Multiple vitamin) .Marland Kitchen.. 1 by mouth once daily 13)  Vitamin D3 1000 Unit Tabs (Cholecalciferol) .Marland Kitchen.. 1 by mouth once daily 14)  Calcium 500 Mg Tabs (Calcium carbonate) .Marland Kitchen.. 1 by mouth once daily 15)  Sussunex (probiotic)  .... Take 1 by mouth once daily 16)  Tylenol Ex St Arthritis Pain 500 Mg Tabs (Acetaminophen) .... Take 1 by mouth at bedtime 17)  Famotidine 20 Mg Tabs (Famotidine) .Marland Kitchen.. 1 by mouth once daily 18)  Metformin Hcl 500 Mg Xr24h-tab (Metformin hcl) .Marland Kitchen.. 1 by mouth once daily 19)  Ciprofloxacin Hcl 500 Mg Tabs (Ciprofloxacin hcl) .Marland Kitchen.. 1 by mouth two times a day 20)  Metronidazole 500 Mg Tabs (Metronidazole) .Marland Kitchen.. 1 by mouth four times daily for 10 days 21)  Docusate Sodium 100 Mg Tabs (Docusate sodium) .Marland Kitchen.. 1 by mouth two times a day 22)  Ensure Liqd (Nutritional supplements) .... Once  daily  Other Orders: Est. Patient Level III (28413)  Patient Instructions: 1)  Continue nasal irrigation once daily 2)  Continue flonase two sprays once daily for two weeks, and if okay then use as needed 3)  Okay to use proair two puffs as needed 4)  Follow up in 6 months   Immunization History:  Pneumovax Immunization History:    Pneumovax:  historical (06/28/2007)

## 2010-07-27 NOTE — Progress Notes (Signed)
Summary: Abd pain/swelling  Phone Note Call from Patient Call back at Home Phone [phone]   Caller: Patient Call For: Newt Lukes MD Summary of Call: Pt tender on left side, nausea,pt feels like abdomen is swollen, not throwing up. Pt does not have diarrhea. Pt does seem to have these symptoms after eating. Pt requests a call. Initial call taken by: Verdell Face,  April 15, 2010 1:40 PM  Follow-up for Phone Call        I called pt.  She states these symptoms started initally on Monday.  She states she has fallen a couple of times due to knee pain.  She states her abdomen is continuing to swell and is severe pain. I informed her Dr. Felicity Coyer is out of office for the afternoon and advised pt to go to nearest ER.  pt understands Follow-up by: Verdell Face,  April 15, 2010 3:48 PM  Additional Follow-up for Phone Call Additional follow up Details #1::        noted - thanks; can make f/u OV as needed  Additional Follow-up by: Newt Lukes MD,  April 16, 2010 11:53 AM

## 2010-08-09 ENCOUNTER — Ambulatory Visit (INDEPENDENT_AMBULATORY_CARE_PROVIDER_SITE_OTHER): Payer: Medicare Other | Admitting: Internal Medicine

## 2010-08-09 ENCOUNTER — Other Ambulatory Visit: Payer: Medicare Other

## 2010-08-09 ENCOUNTER — Encounter: Payer: Self-pay | Admitting: Internal Medicine

## 2010-08-09 ENCOUNTER — Other Ambulatory Visit: Payer: Self-pay | Admitting: Internal Medicine

## 2010-08-09 DIAGNOSIS — E785 Hyperlipidemia, unspecified: Secondary | ICD-10-CM

## 2010-08-09 DIAGNOSIS — E119 Type 2 diabetes mellitus without complications: Secondary | ICD-10-CM

## 2010-08-09 DIAGNOSIS — I10 Essential (primary) hypertension: Secondary | ICD-10-CM

## 2010-08-18 NOTE — Assessment & Plan Note (Signed)
Summary: 3 MTH FU STC   Vital Signs:  Patient profile:   70 year old female Height:      63 inches (160.02 cm) Weight:      168.0 pounds (76.36 kg) O2 Sat:      97 % on Room air Temp:     98.3 degrees F (36.83 degrees C) oral Pulse rate:   77 / minute BP sitting:   120 / 70  (left arm) Cuff size:   regular  Vitals Entered By: Orlan Leavens RMA (August 09, 2010 10:31 AM)  O2 Flow:  Room air CC: 3 month follow-up Is Patient Diabetic? Yes Did you bring your meter with you today? No Pain Assessment Patient in pain? no      Comments Want to discuss herbal meds   Primary Care Provider:  Newt Lukes MD  CC:  3 month follow-up.  History of Present Illness: here for f/u  1) HTN - reports compliance with ongoing medical treatment and no changes in medication dose or frequency. prior dizziness after taking AM pills is improved as she now takes diovan before bed (pm)  2) DM2 - reports compliance with ongoing medical treatment and no changes in medication dose or frequency. checks sugars every morning  - if over 125, takes 1/2 tab glipizide; whole tab if >150 - this occurs 3-4 d/wk - no hypoglycemia symptoms   3) OA - continue pain in R knee and both shoulders - stopped ultracet on becuase not working- still taking tylenol arthritis with incomplete relief of symptoms-  s/p steroid shot due to flare and fall 03/2010  4) chronic gas/abd bloating - improved with probiotics - food dairy reviewed -   5) chronic dry cough - now seeing pulm for same not resolved with H2B and antihistamine and antitussives now on asthma meds and nasal steroids for postnasal drip  Preventive Screening-Counseling & Management  Alcohol-Tobacco     Smoking Status: quit  Clinical Review Panels:  Immunizations   Last Flu Vaccine:  Historical given @ [facility] (04/01/2010)   Last Pneumovax:  Historical (06/28/2007)  Lipid Management   Cholesterol:  217 (03/18/2009)   HDL (good cholesterol):  57.50  (03/18/2009)  Diabetes Management   HgBA1C:  7.3 (04/07/2010)   Creatinine:  0.66 (04/18/2010)   Last Flu Vaccine:  Historical given @ [facility] (04/01/2010)   Last Pneumovax:  Historical (06/28/2007)  CBC   WBC:  7.5 (04/18/2010)   RBC:  3.40 (04/18/2010)   Hgb:  10.4 (04/18/2010)   Hct:  30.9 (04/18/2010)   Platelets:  245 (04/18/2010)   MCV  90.9 (04/18/2010)   RDW  13.0 (04/18/2010)   PMN:  50 (03/16/2009)   Monos:  9 (03/16/2009)   Eosinophils:  0.1 (03/16/2009)  Complete Metabolic Panel   Glucose:  154 (04/18/2010)   Sodium:  137 (04/18/2010)   Potassium:  4.2 (04/18/2010)   Chloride:  105 (04/18/2010)   CO2:  26 (04/18/2010)   BUN:  4 (04/18/2010)   Creatinine:  0.66 (04/18/2010)   Albumin:  4.5 (02/13/2009)   Total Protein:  7.4 (02/13/2009)   Calcium:  8.9 (04/18/2010)   Total Bili:  0.4 (02/13/2009)   Alk Phos:  86 (02/13/2009)   SGPT (ALT):  12 (02/13/2009)   SGOT (AST):  24 (02/13/2009)   Current Medications (verified): 1)  Proair Hfa 108 (90 Base) Mcg/act Aers (Albuterol Sulfate) .... Two Puffs Up To Four Times Per Day As Needed 2)  Flonase 50 Mcg/act Susp (Fluticasone Propionate) .Marland KitchenMarland KitchenMarland Kitchen  Two Sprays Once Daily 3)  Loratadine 10 Mg Tabs (Loratadine) .Marland Kitchen.. 1 By Mouth Once Daily X 2 Weeks, Then As Needed For Allergy Symptoms 4)  Oxcarbazepine 150 Mg Tabs (Oxcarbazepine) .... Take 1 1/2 Two Times A Day 5)  Glipizide 5 Mg Tabs (Glipizide) .... Take 1/2 Tab By Mouth  Every Morning For Sugar Over 125 6)  Losartan Potassium 50 Mg Tabs (Losartan Potassium) .Marland Kitchen.. 1 By Mouth Once Daily 7)  Hydrochlorothiazide 12.5 Mg Tabs (Hydrochlorothiazide) .... Take 1 As Needed For Edema 8)  Klor-Con M20 20 Meq Cr-Tabs (Potassium Chloride Crys Cr) .... Take 1 By Mouth Once Daily As Needed (With Hydrocholrthizde For Edema) 9)  Aspirin 81 Mg Tabs (Aspirin) .Marland Kitchen.. 1 By Mouth Once Daily 10)  Folic Acid 400 Mcg Tabs (Folic Acid) .... Take 1 By Mouth Once Daily 11)  Vitamin B-12 100 Mcg Tabs  (Cyanocobalamin) .Marland Kitchen.. 1 By Mouth Once Daily 12)  Multivitamins  Caps (Multiple Vitamin) .Marland Kitchen.. 1 By Mouth Once Daily 13)  Vitamin D3 1000 Unit Tabs (Cholecalciferol) .Marland Kitchen.. 1 By Mouth Once Daily 14)  Calcium 500 Mg Tabs (Calcium Carbonate) .Marland Kitchen.. 1 By Mouth Once Daily 15)  Sussunex (Probiotic) .... Take 1 By Mouth Once Daily 16)  Tylenol Ex St Arthritis Pain 500 Mg Tabs (Acetaminophen) .... Take 1 By Mouth At Bedtime 17)  Famotidine 20 Mg Tabs (Famotidine) .Marland Kitchen.. 1 By Mouth Once Daily 18)  Metformin Hcl 500 Mg Xr24h-Tab (Metformin Hcl) .Marland Kitchen.. 1 By Mouth Once Daily 19)  Docusate Sodium 100 Mg Tabs (Docusate Sodium) .Marland Kitchen.. 1 By Mouth Two Times A Day 20)  Ensure  Liqd (Nutritional Supplements) .... Once Daily  Allergies (verified): 1)  ! Penicillin  Past History:  Past Medical History: CHEST PAIN hx- cath 07/2004, [facility]: no sig CAD Diabetes mellitus, type II GERD Hypertension Seizure disorder  dyslipidemia OSA - CPAP depression/anxiety osteoarthritis, esp shoulder + knee pain Cough likely from postnasal drip      - Normal chest xray 03/25/10      - PFT 04/26/10 FEV1 2.01(101%), FEV1% 81, TLC 3.84(84%), DLCO 85%, no BD Diverticulosis with acute diverticulitis Oct. 2011    MD roster: [facility] -Pollyann Kennedy  neuro - [facility] cards- [facility]  pulm - Sood  Review of Systems  The patient denies fever, weight gain, chest pain, peripheral edema, headaches, and severe indigestion/heartburn.    Physical Exam  General:  alert, well-developed, well-nourished, and cooperative to examination.    Lungs:  normal respiratory effort, no intercostal retractions or use of accessory muscles; normal breath sounds bilaterally - no crackles and no wheezes.    Heart:  normal rate, regular rhythm, no murmur, and no rub. BLE without edema. Psych:  Oriented X3, memory intact for recent and remote, normally interactive, good eye contact, not anxious appearing, not depressed appearing, and not agitated.       Impression & Recommendations:  Problem # 1:  DIABETES MELLITUS, TYPE II (ICD-250.00)  Her updated medication list for this problem includes:    Glipizide 5 Mg Tabs (Glipizide) .Marland Kitchen... Take 1/2 tab by mouth  every morning for sugar over 125    Losartan Potassium 50 Mg Tabs (Losartan potassium) .Marland Kitchen... 1 by mouth once daily    Aspirin 81 Mg Tabs (Aspirin) .Marland Kitchen... 1 by mouth once daily    Metformin Hcl 500 Mg Xr24h-tab (Metformin hcl) .Marland Kitchen... 1 by mouth once daily  Orders: TLB-A1C / Hgb A1C (Glycohemoglobin) (83036-A1C)  upward trend, by hx and a1c since 06/2009 started metformin  12/2009 -  ok to continue glipizide "as needed"  Labs Reviewed: Creat: 0.66 (04/18/2010)    Reviewed HgBA1c results: 7.3 (04/07/2010)  7.1 (01/07/2010)  Problem # 2:  HYPERTENSION (ICD-401.9)  Her updated medication list for this problem includes:    Losartan Potassium 50 Mg Tabs (Losartan potassium) .Marland Kitchen... 1 by mouth once daily    Hydrochlorothiazide 12.5 Mg Tabs (Hydrochlorothiazide) .Marland Kitchen... Take 1 as needed for edema  BP today: 120/70 Prior BP: 110/72 (05/10/2010) Prior BP: 118/78 (04/26/2010)  Labs Reviewed: K+: 4.2 (04/18/2010) Creat: : 0.66 (04/18/2010)   Chol: 217 (03/18/2009)   HDL: 57.50 (03/18/2009)   TG: 104.0 (03/18/2009)  Problem # 3:  DYSLIPIDEMIA (ICD-272.4)  diet controlled + fish oil  Labs Reviewed: SGOT: 24 (02/13/2009)   SGPT: 12 (02/13/2009)   HDL:57.50 (03/18/2009)  Chol:217 (03/18/2009)  Trig:104.0 (03/18/2009)  Complete Medication List: 1)  Proair Hfa 108 (90 Base) Mcg/act Aers (Albuterol sulfate) .... Two puffs up to four times per day as needed 2)  Flonase 50 Mcg/act Susp (Fluticasone propionate) .... Two sprays once daily 3)  Loratadine 10 Mg Tabs (Loratadine) .Marland Kitchen.. 1 by mouth once daily x 2 weeks, then as needed for allergy symptoms 4)  Oxcarbazepine 150 Mg Tabs (Oxcarbazepine) .... Take 1 1/2 two times a day 5)  Glipizide 5 Mg Tabs (Glipizide) .... Take 1/2 tab by mouth   every morning for sugar over 125 6)  Losartan Potassium 50 Mg Tabs (Losartan potassium) .Marland Kitchen.. 1 by mouth once daily 7)  Hydrochlorothiazide 12.5 Mg Tabs (Hydrochlorothiazide) .... Take 1 as needed for edema 8)  Klor-con M20 20 Meq Cr-tabs (Potassium chloride crys cr) .... Take 1 by mouth once daily as needed (with hydrocholrthizde for edema) 9)  Aspirin 81 Mg Tabs (Aspirin) .Marland Kitchen.. 1 by mouth once daily 10)  Folic Acid 400 Mcg Tabs (Folic acid) .... Take 1 by mouth once daily 11)  Vitamin B-12 100 Mcg Tabs (Cyanocobalamin) .Marland Kitchen.. 1 by mouth once daily 12)  Multivitamins Caps (Multiple vitamin) .Marland Kitchen.. 1 by mouth once daily 13)  Vitamin D3 1000 Unit Tabs (Cholecalciferol) .Marland Kitchen.. 1 by mouth once daily 14)  Calcium 500 Mg Tabs (Calcium carbonate) .Marland Kitchen.. 1 by mouth once daily 15)  Sussunex (probiotic)  .... Take 1 by mouth once daily 16)  Tylenol Ex St Arthritis Pain 500 Mg Tabs (Acetaminophen) .... Take 1 by mouth at bedtime 17)  Famotidine 20 Mg Tabs (Famotidine) .Marland Kitchen.. 1 by mouth once daily 18)  Metformin Hcl 500 Mg Xr24h-tab (Metformin hcl) .Marland Kitchen.. 1 by mouth once daily 19)  Docusate Sodium 100 Mg Tabs (Docusate sodium) .Marland Kitchen.. 1 by mouth two times a day 20)  Ensure Liqd (Nutritional supplements) .... Once daily  Patient Instructions: 1)  it was good to see you today. 2)  medications reviewed - no changes 3)  test(s) ordered today - your results will be posted on the phone tree for review in 48-72 hours from the time of test completion; call [phone] and enter your 9 digit MRN (listed above on this page, just below your name); if any changes need to be made or there are abnormal results, you will be contacted directly.  4)  it is important that you continue to work on losing weight - monitor your diet and consume fewer calories such as less carbohydrates (sugar) and less fat. you also need to increase your physical activity level - start by walking for 10-20 minutes 3 times per week and work up to 30 minutes 4-5  times each  week.  5)  Please schedule a follow-up appointment in 3 months to monitor blood pressure and diabetes, sooner if problems.    Orders Added: 1)  TLB-A1C / Hgb A1C (Glycohemoglobin) [83036-A1C] 2)  Est. Patient Level IV [95621]

## 2010-09-08 LAB — BASIC METABOLIC PANEL
BUN: 2 mg/dL — ABNORMAL LOW (ref 6–23)
BUN: 2 mg/dL — ABNORMAL LOW (ref 6–23)
CO2: 24 mEq/L (ref 19–32)
CO2: 26 mEq/L (ref 19–32)
Calcium: 8.6 mg/dL (ref 8.4–10.5)
Chloride: 103 mEq/L (ref 96–112)
Chloride: 105 mEq/L (ref 96–112)
Chloride: 99 mEq/L (ref 96–112)
Creatinine, Ser: 0.66 mg/dL (ref 0.4–1.2)
Creatinine, Ser: 0.66 mg/dL (ref 0.4–1.2)
Creatinine, Ser: 0.67 mg/dL (ref 0.4–1.2)
GFR calc Af Amer: >60 mL/min (ref >60)
GFR calc Af Amer: >60 mL/min (ref >60)
GFR calc non Af Amer: >60 mL/min (ref >60)
Glucose, Bld: 105 mg/dL — ABNORMAL HIGH (ref 70–99)
Glucose, Bld: 121 mg/dL — ABNORMAL HIGH (ref 70–99)
Potassium: 3 mEq/L — ABNORMAL LOW (ref 3.5–5.1)
Potassium: 4.1 mEq/L (ref 3.5–5.1)
Potassium: 4.2 mEq/L (ref 3.5–5.1)
Sodium: 132 mEq/L — ABNORMAL LOW (ref 135–145)
Sodium: 137 mEq/L (ref 135–145)

## 2010-09-08 LAB — GLUCOSE, CAPILLARY
Glucose-Capillary: 107 mg/dL — ABNORMAL HIGH (ref 70–99)
Glucose-Capillary: 107 mg/dL — ABNORMAL HIGH (ref 70–99)
Glucose-Capillary: 119 mg/dL — ABNORMAL HIGH (ref 70–99)
Glucose-Capillary: 132 mg/dL — ABNORMAL HIGH (ref 70–99)
Glucose-Capillary: 138 mg/dL — ABNORMAL HIGH (ref 70–99)
Glucose-Capillary: 141 mg/dL — ABNORMAL HIGH (ref 70–99)
Glucose-Capillary: 145 mg/dL — ABNORMAL HIGH (ref 70–99)
Glucose-Capillary: 149 mg/dL — ABNORMAL HIGH (ref 70–99)
Glucose-Capillary: 151 mg/dL — ABNORMAL HIGH (ref 70–99)
Glucose-Capillary: 152 mg/dL — ABNORMAL HIGH (ref 70–99)
Glucose-Capillary: 158 mg/dL — ABNORMAL HIGH (ref 70–99)
Glucose-Capillary: 160 mg/dL — ABNORMAL HIGH (ref 70–99)
Glucose-Capillary: 164 mg/dL — ABNORMAL HIGH (ref 70–99)
Glucose-Capillary: 166 mg/dL — ABNORMAL HIGH (ref 70–99)
Glucose-Capillary: 171 mg/dL — ABNORMAL HIGH (ref 70–99)
Glucose-Capillary: 199 mg/dL — ABNORMAL HIGH (ref 70–99)

## 2010-09-08 LAB — CBC
HCT: 30.9 % — ABNORMAL LOW (ref 36.0–46.0)
HCT: 31.9 % — ABNORMAL LOW (ref 36.0–46.0)
HCT: 36.6 % (ref 36.0–46.0)
Hemoglobin: 10.4 g/dL — ABNORMAL LOW (ref 12.0–15.0)
Hemoglobin: 12.9 g/dL (ref 12.0–15.0)
MCH: 30.6 pg (ref 26.0–34.0)
MCH: 31.4 pg (ref 26.0–34.0)
MCHC: 33.7 g/dL (ref 30.0–36.0)
MCHC: 33.9 g/dL (ref 30.0–36.0)
MCV: 89.1 fL (ref 78.0–100.0)
MCV: 89.6 fL (ref 78.0–100.0)
MCV: 90.9 fL (ref 78.0–100.0)
Platelets: 245 10*3/uL (ref 150–400)
Platelets: 262 10*3/uL (ref 150–400)
RBC: 3.4 MIL/uL — ABNORMAL LOW (ref 3.87–5.11)
RBC: 4.11 MIL/uL (ref 3.87–5.11)
RDW: 12.9 % (ref 11.5–15.5)
RDW: 13 % (ref 11.5–15.5)
WBC: 13.6 10*3/uL — ABNORMAL HIGH (ref 4.0–10.5)
WBC: 7.5 10*3/uL (ref 4.0–10.5)

## 2010-09-08 LAB — COMPREHENSIVE METABOLIC PANEL
Albumin: 3.9 g/dL (ref 3.5–5.2)
Alkaline Phosphatase: 75 U/L (ref 39–117)
BUN: 5 mg/dL — ABNORMAL LOW (ref 6–23)
CO2: 29 mEq/L (ref 19–32)
Chloride: 90 mEq/L — ABNORMAL LOW (ref 96–112)
Creatinine, Ser: 0.71 mg/dL (ref 0.4–1.2)
GFR calc non Af Amer: >60 mL/min (ref >60)
Glucose, Bld: 100 mg/dL — ABNORMAL HIGH (ref 70–99)
Potassium: 3.5 mEq/L (ref 3.5–5.1)
Total Bilirubin: 0.4 mg/dL (ref 0.3–1.2)

## 2010-09-08 LAB — URINALYSIS, ROUTINE W REFLEX MICROSCOPIC
Bilirubin Urine: NEGATIVE
Hgb urine dipstick: NEGATIVE
Nitrite: NEGATIVE
Protein, ur: NEGATIVE mg/dL
Urobilinogen, UA: 0.2 mg/dL (ref 0.0–1.0)

## 2010-09-08 LAB — DIFFERENTIAL
Basophils Absolute: 0 10*3/uL (ref 0.0–0.1)
Basophils Relative: 0 % (ref 0–1)
Lymphocytes Relative: 21 % (ref 12–46)
Monocytes Absolute: 1.3 10*3/uL — ABNORMAL HIGH (ref 0.1–1.0)
Neutro Abs: 9.4 10*3/uL — ABNORMAL HIGH (ref 1.7–7.7)

## 2010-09-08 LAB — LIPASE, BLOOD: Lipase: 31 U/L (ref 11–59)

## 2010-09-21 ENCOUNTER — Other Ambulatory Visit: Payer: Self-pay | Admitting: Internal Medicine

## 2010-09-29 LAB — GLUCOSE, CAPILLARY
Glucose-Capillary: 101 mg/dL — ABNORMAL HIGH (ref 70–99)
Glucose-Capillary: 103 mg/dL — ABNORMAL HIGH (ref 70–99)

## 2010-10-01 LAB — POCT CARDIAC MARKERS
Myoglobin, poc: 115 ng/mL (ref 12–200)
Troponin i, poc: <0.05 ng/mL (ref 0.00–0.09)

## 2010-10-01 LAB — CBC
Platelets: 228 10*3/uL (ref 150–400)
WBC: 7.3 10*3/uL (ref 4.0–10.5)

## 2010-10-01 LAB — DIFFERENTIAL
Lymphocytes Relative: 41 % (ref 12–46)
Lymphs Abs: 3 10*3/uL (ref 0.7–4.0)
Neutrophils Relative %: 50 % (ref 43–77)

## 2010-10-01 LAB — BASIC METABOLIC PANEL
BUN: 8 mg/dL (ref 6–23)
Creatinine, Ser: 0.76 mg/dL (ref 0.4–1.2)
GFR calc non Af Amer: >60 mL/min (ref >60)

## 2010-10-12 LAB — DIFFERENTIAL
Basophils Absolute: 0 10*3/uL (ref 0.0–0.1)
Basophils Relative: 1 % (ref 0–1)
Eosinophils Relative: 0 % (ref 0–5)
Monocytes Absolute: 0.5 10*3/uL (ref 0.1–1.0)
Neutro Abs: 3.9 10*3/uL (ref 1.7–7.7)

## 2010-10-12 LAB — D-DIMER, QUANTITATIVE: D-Dimer, Quant: 0.73 ug/mL-FEU — ABNORMAL HIGH (ref 0.00–0.48)

## 2010-10-12 LAB — CBC
Hemoglobin: 13.5 g/dL (ref 12.0–15.0)
MCHC: 34.6 g/dL (ref 30.0–36.0)
Platelets: 248 10*3/uL (ref 150–400)
RDW: 14.4 % (ref 11.5–15.5)

## 2010-10-12 LAB — POCT I-STAT, CHEM 8
Calcium, Ion: 1.17 mmol/L (ref 1.12–1.32)
Glucose, Bld: 112 mg/dL — ABNORMAL HIGH (ref 70–99)
HCT: 43 % (ref 36.0–46.0)
Hemoglobin: 14.6 g/dL (ref 12.0–15.0)
Potassium: 3.8 mEq/L (ref 3.5–5.1)

## 2010-10-12 LAB — POCT CARDIAC MARKERS

## 2010-10-25 ENCOUNTER — Other Ambulatory Visit: Payer: Self-pay | Admitting: Internal Medicine

## 2010-11-05 ENCOUNTER — Other Ambulatory Visit: Payer: Self-pay | Admitting: Internal Medicine

## 2010-11-08 ENCOUNTER — Other Ambulatory Visit (INDEPENDENT_AMBULATORY_CARE_PROVIDER_SITE_OTHER): Payer: Medicare Other

## 2010-11-08 ENCOUNTER — Other Ambulatory Visit (INDEPENDENT_AMBULATORY_CARE_PROVIDER_SITE_OTHER): Payer: Medicare Other | Admitting: Internal Medicine

## 2010-11-08 ENCOUNTER — Ambulatory Visit (INDEPENDENT_AMBULATORY_CARE_PROVIDER_SITE_OTHER): Payer: Medicare Other | Admitting: Internal Medicine

## 2010-11-08 ENCOUNTER — Encounter: Payer: Self-pay | Admitting: Internal Medicine

## 2010-11-08 DIAGNOSIS — E119 Type 2 diabetes mellitus without complications: Secondary | ICD-10-CM

## 2010-11-08 DIAGNOSIS — R5383 Other fatigue: Secondary | ICD-10-CM

## 2010-11-08 DIAGNOSIS — R5381 Other malaise: Secondary | ICD-10-CM

## 2010-11-08 DIAGNOSIS — I10 Essential (primary) hypertension: Secondary | ICD-10-CM

## 2010-11-08 DIAGNOSIS — Z79899 Other long term (current) drug therapy: Secondary | ICD-10-CM

## 2010-11-08 LAB — CBC WITH DIFFERENTIAL/PLATELET
Basophils Absolute: 0 10*3/uL (ref 0.0–0.1)
Eosinophils Absolute: 0.1 10*3/uL (ref 0.0–0.7)
Lymphocytes Relative: 47.1 % — ABNORMAL HIGH (ref 12.0–46.0)
MCHC: 34.9 g/dL (ref 30.0–36.0)
Neutro Abs: 2.9 10*3/uL (ref 1.4–7.7)
Neutrophils Relative %: 41.3 % — ABNORMAL LOW (ref 43.0–77.0)
Platelets: 275 10*3/uL (ref 150.0–400.0)
RDW: 13.6 % (ref 11.5–14.6)

## 2010-11-08 LAB — HEPATIC FUNCTION PANEL
AST: 24 U/L (ref 0–37)
Alkaline Phosphatase: 65 U/L (ref 39–117)
Total Bilirubin: 0.5 mg/dL (ref 0.3–1.2)

## 2010-11-08 LAB — BASIC METABOLIC PANEL
BUN: 10 mg/dL (ref 6–23)
CO2: 28 mEq/L (ref 19–32)
Calcium: 10.2 mg/dL (ref 8.4–10.5)
Creatinine, Ser: 0.7 mg/dL (ref 0.4–1.2)
Glucose, Bld: 46 mg/dL — CL (ref 70–99)

## 2010-11-08 LAB — TSH: TSH: 1.7 u[IU]/mL (ref 0.35–5.50)

## 2010-11-08 NOTE — Assessment & Plan Note (Signed)
BP Readings from Last 3 Encounters:  11/08/10 112/62  08/09/10 120/70  05/10/10 110/72   The current medical regimen is effective;  continue present plan and medications.

## 2010-11-08 NOTE — Assessment & Plan Note (Signed)
The current medical regimen is effective;  continue present plan and medications. Lab Results  Component Value Date   HGBA1C 6.5 08/09/2010

## 2010-11-08 NOTE — Assessment & Plan Note (Signed)
nonsp hx and exam - check labs now

## 2010-11-08 NOTE — Progress Notes (Signed)
  Subjective:    Patient ID: [name], female    DOB: [birth year], 70 y.o.   MRN: [id number]  HPI   here for follow up - reviewed chronic medical issues today:  HTN - reports compliance with ongoing medical treatment and no changes in medication dose or frequency. prior dizziness after taking AM pills is improved as she now takes diovan before bed (pm)  DM2 - reports compliance with ongoing medical treatment and no changes in medication dose or frequency. checks sugars every morning  - if over 125, takes 1/2 tab glipizide; whole tab if >150 - this occurs 3-4 d/wk - no hypoglycemia symptoms   OA - continue pain in R knee and both shoulders - stopped ultracet on becuase not working- still taking tylenol arthritis with incomplete relief of symptoms-  s/p steroid shot due to flare fall 03/2010  IBS - chronic gas/abd bloating - improved with probiotics - food dairy reviewed - exac following hosp for diverticulitis 03/2010 but now back to baseline  Past Medical History  Diagnosis Date  . Diabetes mellitus   . GERD (gastroesophageal reflux disease)   . Depression   . Seizure disorder   . Dyslipidemia   . OSA on CPAP   . Osteoarthritis of shoulder region     and Knee  . Anxiety   . Diverticulosis of colon 03/2010 hosp  . Hypertension     Review of Systems denies CP or dyspnea    Objective:   Physical Exam BP 112/62  Pulse 74  Temp(Src) 98.6 F (37 C) (Oral)  Ht 5\' 3"  (1.6 m)  Wt 172 lb (78.019 kg)  BMI 30.47 kg/m2  SpO2 98% Physical Exam  Constitutional: She is overweight; oriented to person, place, and time. She appears well-developed and well-nourished. No distress.  Neck: Normal range of motion. Neck supple. No JVD present. No thyromegaly present.  Cardiovascular: Normal rate, regular rhythm and normal heart sounds.  No murmur heard.  No BLE edema Pulmonary/Chest: Effort normal and breath sounds normal. No respiratory distress. She has no wheezes. Psychiatric: She has a  normal mood and affect. Her behavior is normal. Judgment and thought content normal.   Lab Results  Component Value Date   WBC 7.5 04/18/2010   HGB 10.4* 04/18/2010   HCT 30.9* 04/18/2010   PLT 245 04/18/2010   CHOL 217* 03/18/2009   TRIG 104.0 03/18/2009   HDL 57.50 03/18/2009   LDLDIRECT 140.4 03/18/2009   ALT 25 04/15/2010   AST 29 04/15/2010   NA 137 04/18/2010   K 4.2 04/18/2010   CL 105 04/18/2010   CREATININE 0.66 04/18/2010   BUN 4* 04/18/2010   CO2 26 04/18/2010   TSH 1.20 03/18/2009   HGBA1C 6.5 08/09/2010   MICROALBUR 1.0 10/07/2009   Lab Results  Component Value Date   VITAMINB12 758 03/18/2009   Wt Readings from Last 3 Encounters:  11/08/10 172 lb (78.019 kg)  08/09/10 168 lb (76.204 kg)  05/10/10 170 lb 1.9 oz (77.165 kg)        Assessment & Plan:  See problem list. Medications and labs reviewed today.

## 2010-11-08 NOTE — Patient Instructions (Signed)
It was good to see you today. Test(s) ordered today. Your results will be called to you after review (48-72hours after test completion). If any changes need to be made, you will be notified at that time. Medications reviewed, no changes at this time. Please schedule followup in 3-4 months, call sooner if problems.

## 2010-11-09 NOTE — H&P (Signed)
NAMEYOLINDA, DUERR                 ACCOUNT NO.:  [id number]   MEDICAL RECORD NO.:  [id number]          PATIENT TYPE:  EMS   LOCATION:  [facility]                         FACILITY:  [facility]   PHYSICIAN:  Ladell Pier, M.D.   DATE OF BIRTH:  10/04/1940   DATE OF ADMISSION:  08/04/2007  DATE OF DISCHARGE:                              HISTORY & PHYSICAL   CHIEF COMPLAINT:  Chest pain and left shoulder pain.   HISTORY OF PRESENT ILLNESS:  The patient is a 70 year old African-  American female that has been admitted multiple times with chest pain.  She had a cardiac catheterization in 2007, cardiac workup in April, in  January 2008 and also April 2008.  She came in today complaining of  chest pain that woke her up about 3:30 a.m. this morning, was in the  left side of her chest.  Left side of her chest also feels tender.  The  pain also felt like a heaviness, lasted 20 minutes.  The episodes were  intermittent.  She did have some shortness of breath and diaphoresis and  nausea with the pain.  She also complains of left shoulder pain that has  been going on for two weeks.  She is unable to lift her arm, secondary  to pain.  Pain in her chest is about a 9/10.  She still complains of  numbness on her cheek, on the left side.   PAST MEDICAL HISTORY:  Significant for:  1. Diabetes.  2. Hypertension.  3. GERD.  4. Hyperlipidemia.  5. Osteoarthritis.  6. She fell in 2008 and had injury to the left shoulder.  7. Obstructive sleep apnea, for which she uses a CPAP.  8. Seizure disorder.   FAMILY HISTORY:  Mother is 13 with a history of lymphoma and rheumatoid  arthritis.  Father died from a heart attack in his 43's.   SOCIAL HISTORY:  She is married.  She has two children.  She is retired,  remote tobacco history.  No alcohol use.   MEDICATIONS:  1. She takes Benicar 20/12.5 daily.  2. Ranitidine 300 mg twice daily.  3. Aspirin 81 mg daily.  4. Glipizide 5 mg daily.  5. Oxcarbazepine 150 mg,  1-1/2 twice daily.   ALLERGIES:  PENICILLIN.   REVIEW OF SYSTEMS:  As first stated in the HPI.   PHYSICAL EXAMINATION:  VITAL SIGNS:  Temperature 97.8, blood pressure  103/66, pulse 70, respirations 16, pulse ox 99% on room air.  HEENT:  Normocephalic, atraumatic.  Pupils reactive to light.  Throat  without erythema.  CARDIOVASCULAR:  Regular rate and rhythm.  LUNGS:  Clear bilaterally.  ABDOMEN:  Positive bowel sounds.  EXTREMITIES:  2+ DP pulses bilaterally, no edema.  She does have  tenderness on the chest wall on the left, and when you try to lift her  arm, she has decreased strength secondary to pain.   LABORATORY:  Chest x-ray shows no acute disease.  Sodium 136, potassium  3.4, chloride 103, BUN 6, glucose 139, hemoglobin 13.9, creatinine 0.8,  myoglobin 45, MB 1.7, troponin less than 0.05,  D-dimer less than 0.22.   ASSESSMENT/PLAN:  1. Atypical chest pain:  With patient having tenderness in her chest      and recently 2007, negative cardiac workup.  January 2008, negative      cardiac workup, on April 2008 negative cardiac workup.  This is      unlikely to be cardiac in etiology.  Will admit, check EKG, serial      enzymes, and then patient will follow with her primary care      physician for possible referral to cardiology.  2. Shoulder pain:  Will get a x-ray of her shoulder.  If negative,      will get an MRI to rule out rotator cuff tear.  3. Diabetes:  Continue her home medication.  4. Hypertension:  Continue her home medication.  5. Seizure disorder:  Will continue her home medication.  She has no      recent episode of seizure.      Ladell Pier, M.D.  Electronically Signed     [place]/MEDQ  D:  08/04/2007  T:  08/04/2007  Job:  [id number]   cc:   Della Goo, M.D.

## 2010-11-09 NOTE — Discharge Summary (Signed)
[name], [name]                 ACCOUNT NO.:  [id number]   MEDICAL RECORD NO.:  [id number]          PATIENT TYPE:  INP   LOCATION:  1605                         FACILITY:  [facility]   PHYSICIAN:  Jene Every, M.D.    DATE OF BIRTH:  1940/08/19   DATE OF ADMISSION:  04/03/2008  DATE OF DISCHARGE:  04/08/2008                               DISCHARGE SUMMARY   ADMISSION DIAGNOSES:  Includes:  1. Degenerative joint disease, right knee.  2. Non-insulin-dependent diabetes.  3. Hypertension.  4. Gastroesophageal reflux disease.  5. Hyperlipidemia.  6. Depression, anxiety.   DISCHARGE DIAGNOSES:  Includes:  1. Status post right total knee arthroplasty.  2. Resolved altered mental state with no evidence of epileptic      activity on EEG.  No evidences of transient ischemic attack.      Question medicine induced.  3. Asymptomatic postoperative anemia.  4. Non-insulin-dependent diabetes.  5. Hypertension.  6. Gastroesophageal reflux disease.  7. Hyperlipidemia.  8. Depression, anxiety.   PROCEDURE:  The patient was taken to the OR on October 8 and underwent a  right total knee arthroplasty.  Surgeon:  Jene Every, M.D.  Assistant:  Roma Schanz, P.A.  Anesthesia:  General.  Complications:  None.  Blood loss:  Minimal.   HISTORY:  Ms. Hardiman is well known to our practice.  She presents with  onset of right knee pain with several weeks history with giving away  sensation.  She has undergone conservative treatment without any  significant relief of her symptoms.  X-rays reveal tricompartmental  osteoarthritis.  It was felt at this point the patient would benefit  from a total knee arthroplasty.  Risks and benefits of surgery were  discussed with the patient.  Medical clearance was obtained.  She did  elect to proceed.   CONSULTANTS:  PT, OT pharmacy, [facility] hospitalist, neurology.   LABORATORY:  CBC done postoperatively showed a white cell count 10.4,  hemoglobin 12,  hematocrit 35.5.  This was monitored throughout the  hospital course.  White cell count remained stable.  Hemoglobin did drop  to a lowest of 10.8, hematocrit 31.2.  Routine chemistries,  postoperative sodium was 136, potassium 3.7, elevated glucose of 154,  BUN remained stable.  These were monitored throughout the hospital  stay.  Sodium and potassium remained within normal range.  Glucose did  fluctuate.  BUN and creatinine remained stable as well.  The patient was  placed on Coumadin for DVT prophylaxis.  At time of discharge to nursing  facility she had INR and 1.8.  Preoperative urinalysis was negative.  Preoperative chest x-ray showed no acute cardiopulmonary findings.  EKG  preoperatively showed normal sinus rhythm.   HOSPITAL COURSE:  The patient was admitted, taken to the OR for the  above-stated procedure.  She was then transferred to PACU and then to  the orthopedic floor for continued postoperative care.  Intraoperatively, one Hemovac drain was placed.  Postoperatively, the  patient was placed on PCA Dilaudid for pain relief.  Initially, the  patient did well.  On postoperative day #1 I presented  to floor to  rounds.  I was advised by the nursing staff they felt the patient had  just undergone either seizure activity or TIA.  She had an episode prior  to breakfast of altered mental status.  She denied any pain just said  she felt funny.  After about 10-15 minutes the patient came around was  able to speak.  She knew her name.  She knew date, time and place.  Again, she denied any symptoms.  While I was continuing up on the floor  the patient had a recurrent episode of similar type blunt affect.  She  just stared straight ahead, inability to speak at that time.  The rapid  response team was called to evaluate the patient.  She did have slight  decrease in her respiratory rate otherwise, vital signs remained stable.  She had on EKG some PVCs, but otherwise was uneventful.  I did  at that  time consult [facility], as well as Dr. Thad Ranger who sent one of his  associates to evaluate the patient.  A EEG was ordered.  The patient was  transferred to a step-down unit.  Over the course of the next couple of  days the patient had no further events.  There was question if this  altered mental status was related to medication whether it be the PCA  Dilaudid or the antibiotic.  The PCA was discontinued.  The patient was  converted to p.o. medication.  On postoperative day #2 the patient's  dressing was changed.  Hemovac was removed without difficulty.  Therapy  was resumed.  Coumadin was started for DVT prophylaxis.  All labs  remained stable.  EEG showed no evidence of epileptiform activity.  On  postoperative day #3 the patient was stable and was transferred back to  the orthopedic floor.  Therapy was continued.  She did quite well with  therapy.  Labs remained stable.  Again, no further episodes of altered  mental status.  Incision remained clean and dry.  There was no effusion.  The patient had good range of motion.  Calf was soft, nontender without  evidence of DVT.  On postoperative day #5 the patient was doing very  well.  She was stable.  It was felt at this time she would need  additional recovery at skilled nursing facility secondary to lack of  help at home.  Therefore, bed search was initiated a couple of days  prior.  On October 13 a bed was available at skilled nursing facility of  choice.   DISPOSITION:  The patient is stable to be discharged to skilled nursing  facility of choice.  She is to follow up with Jene Every, M.D. in  approximately 2 weeks for suture removal and x-ray.  Follow up with her  primary care, as well as neurologist as scheduled.   DISCHARGE MEDICATIONS:  1. Coumadin as dosed per pharmacy.  We would like her INR to stay      around 2.  2. Colace 110 mg one p.o. b.i.d.  3. Iron supplementation 325 mg one p.o. t.i.d.  4. Glucotrol 5 mg  one p.o. daily.  5. Hydrochlorothiazide 12.5 mg one p.o. nightly.  6. Benicar 20 mg one p.o. nightly.  7. Trileptal 225 mg one p.o. b.i.d.  8. K-Dur 20 mEq one p.o. nightly.  9. Vitamin C 500 mg daily.  10.Xanax 0.5 mg one p.o. b.i.d. p.r.n.  11.Robaxin 500 mg one p.o. q.6h. p.r.n. spasm.  12.Percocet one to  two p.o. q.4-6h. p.r.n. pain.  13.Ambien 5 mg one p.o. nightly p.r.n. sleep.   WOUND CARE:  The patient is to change her dressing daily.  Keep this  area clean and dry.  It is okay for her to shower.   ACTIVITY:  She is to full weight-bear as tolerated.  Continue elevation  six times a day for 20 minutes at a time.  She would need to continue  utilizing her knee immobilizer until she can straight leg raise x 10.  At that time she can discontinue.   DIET:  High fiber low sugar and low carb.   CONDITION ON DISCHARGE:  Stable.   FINAL DIAGNOSIS:  Status post right total knee arthroplasty.      Roma Schanz, P.A.      Jene Every, M.D.  Electronically Signed    CS/MEDQ  D:  04/08/2008  T:  04/08/2008  Job:  [id number]

## 2010-11-09 NOTE — H&P (Signed)
[name], [name]                 ACCOUNT NO.:  [id number]   MEDICAL RECORD NO.:  [id number]          PATIENT TYPE:  INP   LOCATION:  NA                           FACILITY:  [facility]   PHYSICIAN:  Jene Every, M.D.    DATE OF BIRTH:  1940-10-08   DATE OF ADMISSION:  04/03/2008  DATE OF DISCHARGE:                              HISTORY & PHYSICAL   CHIEF COMPLAINT:  Right knee pain.   HISTORY:  [name] is a pleasant 70 year old female with a longstanding  history of right knee pain, initially started after a fall several years  ago, and got progressively worse.  She does note some locking, swelling  and instability in the knee.  She does note significant loss of range of  motion.  Radiographs obtained in our office at initial visit do show end-  stage osteoarthrosis of the knee along the medial compartment.  It is  felt at this point on reviewing all treatment options that she would  benefit from a total knee arthroplasty.  The risks and benefits of this  were discussed with the patient, medical clearance was obtained, and she  has elected to proceed.   MEDICAL HISTORY:  Sent for non-insulin-dependent diabetes, hypertension  seizure of unknown origin, gastroesophageal reflux disease, anxiety.   CURRENT MEDICATIONS:  1. Includes and glipizide 5 mg 1 p.o. daily.  2. Benicar hydrochlorothiazide 20/12.5 mg 1 p.o. daily.  3. Klor-Con 20 mEq 1 p.o. daily.  4. Oxcarbazepine 150 mg 1/2 tablet q. a.m. 1/2 tablet q. p.m.  5. Ranitidine 300 mg 1 p.o. b.i.d. p.r.n.  6. Aspirin 81 mg daily.  7. Alprazolam 0.5 mg 1 p.o. b.i.d. p.r.n. anxiety.  8. Meclizine 25 mg 1 p.o. q.8 p.r.n. dizziness.  9. Ibuprofen 800 mg 1 p.o. daily p.r.n.  10.Multivitamin.  11.Vitamin B complex.  12.Flaxseed oil.   ALLERGIES:  PENICILLIN, which causes itching and rash.   PAST SURGERIES:  Include cholecystectomy, partial hysterectomy.   SOCIAL HISTORY:  The patient is married.  She is retired.  She quit  smoking  approximately 8 years ago.  She does drink alcohol on occasional  basis.  Husband will be caregiver following surgery, but she will likely  need rehab stay.   FAMILY HISTORY:  Significant for coronary artery disease, father and  mother.  Mother just passed away recently.   REVIEW OF SYSTEMS:  GENERAL:  The patient denies any fever, chills,  night sweats or bleeding tendencies.  CNS:  No blurred or double vision,  seizure, headache or paralysis.  It has been over a year since the  patient has had a seizure.  RESPIRATORY:  The patient does note  shortness of breath, which is random.  This is unchanged.  No productive  cough, hemoptysis or chest pain.  CARDIOVASCULAR:  No chest pain,  angina, or orthopnea.  GU:  No dysuria, hematuria or discharge.  GI:  The patient does note chronic constipation.  No nausea, vomiting,  diarrhea, melena or bloody stools.  MUSCULOSKELETAL:  As per the HPI.   PHYSICAL EXAMINATION:  CONSTITUTIONAL:  This is  a slightly overweight  female who is very anxious in nature.  VITAL SIGNS:  Pulse is 60 and irregular, respiratory 12, BP 110/76.  GENERAL:  She does walk with an antalgic gait.  HEENT: Atraumatic, normocephalic.  Pupils equal round and reactive to  light.  EOMs intact.  NECK:  Supple.  No lymphadenopathy.  CHEST:  Clear to auscultation bilaterally.  However she does have  shallow breath sounds.  No rhonchi, wheezes or rales.  BREASTS AND GU:  Not pertinent to HPI.  HEART:  Irregular rhythm, regular rate.  No gallops or rubs are noted.  SKIN:  No rashes or lesions are noted.  ABDOMEN:  Soft, nondistended.  Bowel sounds x4.  EXTREMITIES:  She has mild effusion in the right knee.  She is tender  along the medial joint line.  There is pain with patellofemoral  compression.  Range of motion is minus 5 to 90 degrees.  No instability  with varus or valgus stressing.  She does have with quadriceps strength.   IMPRESSION:  Degenerative joint disease right  knee.   PLAN:  The patient will undergo a right total knee arthroplasty.      Roma Schanz, P.A.      Jene Every, M.D.  Electronically Signed    CS/MEDQ  D:  03/31/2008  T:  03/31/2008  Job:  [id number]

## 2010-11-09 NOTE — Op Note (Signed)
NAMEZEHRA, RUCCI                 ACCOUNT NO.:  [id number]   MEDICAL RECORD NO.:  [id number]          PATIENT TYPE:  INP   LOCATION:  1605                         FACILITY:  [facility]   PHYSICIAN:  Jene Every, M.D.    DATE OF BIRTH:  1940/09/06   DATE OF PROCEDURE:  04/03/2008  DATE OF DISCHARGE:  04/08/2008                               OPERATIVE REPORT   PREOPERATIVE DIAGNOSIS:  Degenerative joint disease of the right knee.   POSTOPERATIVE DIAGNOSIS:  Degenerative joint disease of the right knee.   PROCEDURE:  Right total knee arthroplasty.   ANESTHESIA:  General.   ASSISTANT:  [employer]rad[employer].   COMPONENTS:  DePuy rotating platform.   BRIEF HISTORY AND INDICATIONS:  A 70 year old with end-stage  osteoarthrosis of the knee, indicated for replacement of the degenerated  joint.  Risks and benefits were discussed including bleeding, infection,  damage to neurovascular structures, suboptimal range of motion,  component failure, need for revision, DVT, PE, and anesthetic  complications, etc.   The patient in supine position.  After administration of adequate  anesthesia and 1 gram of Kefzol, the right lower extremity was prepped  and draped and exsanguinated in the usual sterile fashion.  Thigh  tourniquet inflated to 300 mmHg.  Midline incision was made over the  anterior aspect of the knee.  Subcutaneous tissue was dissected.  Electrocautery was utilized to achieve hemostasis.  Medial parapatellar  arthrotomy was performed.  Patella was everted, knee was flexed.  Tricompartmental osteoarthrosis was noted.  Rongeur was utilized to  remove osteophytes.  The remnant of the ACL and the medial lateral  collateral ligament were removed.  We elevated the flap medially, the  superficial portion of the medial collateral ligament.  Next, step drill  was utilized, and the end of the femur was irrigated.  Intramedullary  rod 5 degree right was placed.  This was then pinned; 10 mm taken off  the  distal femur.  The distal femur was then measured.  It was measured  as a 4.  This was off the anterior aspect of the anterior cortex.  This  was pinned in the appropriate rotation, and the anterior, posterior and  chamfer cuts were then performed.  Next, the attention was turned  towards the tibia, external alignment guide placed.  We took 10 off the  high side which was laterally.  The external alignment guide was  bisecting the ankle joint anterior to the tibia 0-degree slope.  This  was then pinned.  Soft tissue well protected, performed the tibial cut.  Next, we checked the flexion/extension gap, and they were equivalent  with a 10 insert.  Next, attention was turned towards completing the  tibia.  It was flexed.  Retractors were then placed with a baseplate  sized best to a four in the appropriate rotation just medial to the  tibial tubercle, maximizing coverage of the tibia.  There was then a  punch guide.  It was then punched and placed, after the central drill  hole was placed.  The trial tibial component was then placed.  We  then  proceeded to form a box cut on the femur.  The external jig was applied,  centralizing of the jig on the end of the femur, bisecting the  epicondyles.  We then performed the box guide with an oscillating saw,  protecting the soft tissues.  We then placed a trial femur, trial tibia,  and 10-mm insert.  This was after checking posteriorly and removing the  remnant of the meniscus and cauterizing the geniculate.  Full extension,  full flexion, no anterior drawer.  At that time, we then resurfaced the  patella and measured it at appropriate measurement to maximize the  surface area.  Used a planer, measured it prior to that to 23 to 14  plane.  Used a 38, drilled the peg holes for the 38, medializing the  drill holes for the patella for improvement in tracking.  Trial patella  was then placed.  Good tracking was noted in the trial, good flexion,  good  extension, good stability to varus and valgus stressing 0 to 30  degrees.  All trials were removed.  Used pulsatile lavage.  Cleaned the  bone ends.  Cement was mixed on the posterior table in the appropriate  fashion.  We flexed the knee and dried the surfaces completely.  I  injected cement in the proximal tibia, inserted the tibial tray,  impacted it, redundant cement removed, placed cement on the distal  femur, impacted the distal femoral component, excellent fit, redundant  cement removed.  Placed a trial insert, held it in extension with an  axial load applied.  We cemented the patellar component with a clamp  applied to that.  Redundant cement removed after appropriate curing.  Examined the knee, good flexion, good extension, flexion to 40, and good  stability to varus and valgus stressing.  Removed the trial, selected as  a 10.  Checked in the joint and removed residual cement, copiously  irrigated.  Placed a permanent 10 trial.  Again full extension, flexion  to 140, good stability to varus and valgus stressing 0 to 30 degrees,  good patellofemoral tracking.  No lateral release required.  No anterior  drawer.  Put a Hemovac and brought it out through a lateral stab wound  in the skin.  We prepared the patellar arthrotomy with #1 Vicryl  interrupted figure-of-eight sutures.  Subcutaneous tissue reapproximated  with 2-0 Vicryl simple sutures.  Skin was reapproximated with staples.  The patient had flexion past 90 degrees, gravity tested.  Good  extension.  Wound was dressed sterilely.  Tourniquet was deflated, and  there was adequate revascularization of the lower extremity appreciated.   The patient tolerated the procedure well.  There were no complications.  Tourniquet time was 1 hour and 45 minutes.  Assistant was [employer].      Jene Every, M.D.  Electronically Signed     JB/MEDQ  D:  06/05/2008  T:  06/06/2008  Job:  [id number]

## 2010-11-09 NOTE — Procedures (Signed)
Currently in 12:35 [facility], the patient of Dr. Jene Every, orthopedic surgeon.  This is a portable EEG study on the patient  70 years of age who has a remote seizure history and was admitted for a  scheduled knee replacement surgery.  She developed during the morning a  staring spell, it was unresponsive and afterwards amnestic.  Her last  described seizure for which she is treated by Dr. Thad Ranger was in August  2008, and the patient has been on Trileptal.   PAST MEDICAL HISTORY:  1. Diabetes.  2. GERD.  3. Hypertension.  4. Anxiety.  5. Osteoarthritis.   CURRENT MEDICATIONS:  Cefazolin, Colace, Pepcid, Glucotrol,  hydrochlorothiazide, NovoLog insulin, Trileptal, Benicar, Reglan, K-Dur,  Tylenol, Xanax, Benadryl, Vicodin, Robaxin, Narcan, Zofran, Compazine,  and Phenergan.   DESCRIPTION:  This 16 channel EEG recording was 1 channel representing  and the heart rate and rhythm exclusively became evident that the  patient has a sinus rhythm of about 72 beats per minute, but with  variable R to R intervals about once per 30-seconds.  There is an  irregular heartbeat noted.  The patient was drowsy and softly snoring at  the beginning of this recording.  Once she was awoken, a posterior  dominant background rhythm was established approximately 9 Hz.  There is  some muscle artifact activity seen over the frontal polar region right  more than left, but in general this EEG appears to be very symmetric.  Since the patient is on a morphine pump, it would explain that she  appears slightly drowsy and that the theta range frequencies dominate  most of this recording.   CONCLUSION:  This is a drowsy EEG with intermittently documented  irregular heartbeats.  The patient also fell asleep for several minutes  during this recording.  Epileptiform activity was not noted.      Melvyn Novas, M.D.  Electronically Signed     [id number]  D:  04/05/2008 13:15:49  T:  04/05/2008  15:02:47  Job #:  [id number]   cc:   Casimiro Needle L. Thad Ranger, M.D.  Fax: [phone]   Jene Every, M.D.  Fax: [phone]

## 2010-11-09 NOTE — Consult Note (Signed)
[name], [name]                 ACCOUNT NO.:  [id number]   MEDICAL RECORD NO.:  [id number]          PATIENT TYPE:  INP   LOCATION:  1235                         FACILITY:  [facility]   PHYSICIAN:  Melvyn Novas, M.D.  DATE OF BIRTH:  Jul 03, 1940   DATE OF CONSULTATION:  04/04/2008  DATE OF DISCHARGE:                                 CONSULTATION   HISTORY:  [name] is a 70 year old African American right-handed  married female patient of Dr. Thad Ranger who has followed her for 3 years  for seizure disorder.  The patient states that she has had seizures in  the past that are accompanied by a jittering or twitching in both arms  and hands, her eyelids flutter and she has a seizure.  She has been  tried on various anticonvulsants and has done well on a mixture of  benzodiazepines to control an anxiety disorder and Trileptal at 150 mg  tablets 1.5 b.i.d.  Today, a day after her scheduled admission for a total right-sided knee  replacement surgery, the patient was at breakfast, found to stare ahead  and not to respond to verbal questions at all.  She was then without  memory for the whole breakfast, but gradually recovered her ability to  speak and move.  Her nurses were concerned that this could have been a seizure  manifestation versus a TIA.   PAST MEDICAL HISTORY:  1. Seizure disorder.  2. Anxiety.  3. Hypertension.  4. Osteoarthritis.  5. Bursitis.  6. Chronic vertigo.  7. GERD.  8. Hyperlipidemia.  9. Diabetes mellitus type 2, not insulin dependent.   MEDICATIONS:  As quoted from her admission orders:  1. We are not including Coumadin yet, but the patient has to be      started on Coumadin.  The patient has been started on Coumadin 7.5      mg yesterday night with a goal for the INR of 2-3.  2. She is on oxcarbazepine.  3. Potassium chloride.  4. Benicar.  5. Hydrochlorothiazide.  6. Aspirin 81 mg daily.  Aspirin was stopped prior to surgery.  7. Alprazolam.  8.  Ranitidine.  9. Meclizine.  10.Vitamin E.  11.Glipizide, there are no doses named   ALLERGIES:  PENICILLIN.   SOCIAL HISTORY:  The patient has adult children.  Married and lives with  her spouse.  She is active in her church community.  She has never  smoked.  Does not drink alcohol.  The patient states that she is  retired.   FAMILY HISTORY:  Positive for osteoporosis on the maternal line and  hypertension in both parents.  Coronary artery disease she states has affected father and mother.  Her  mother just passed away recently in her late 82s   PHYSICAL EXAMINATION:  VITAL SIGNS:  Stable.  The patient has an  elevated blood pressure 160/80, respiratory rate of 16.  LUNGS:  Clear to auscultation.  No rales, no rhonchi, no wheezing.  NECK:  She has no goiter.  She has Mallampati grade C.  EXTREMITIES:  All peripheral pulses are present.  MENTAL STATUS:  Alert, fully oriented, fluent speech.  Very proper  demeanor.  Able to repeat fluently.  No dysarthria.  No aphagia.  She  has symmetric facial features.  Full extraocular movements.  No  nystagmus.  Full visual fields.  Bilateral pupils react equally to light  and accommodation.  Tongue and uvula move in midline.  She has full  range of motion for the neck.  All extremities move spontaneous.  The  right knee due to recent surgery was not examined.  By patellar reflex,  she has bilaterally downgoing toes to plantar stimulation.  Deep tendon  reflexes in the bilateral upper extremities are symmetric.  Sensory is  intact to primary modalities on trunk and all extremities.   DIAGNOSTICS:  EKG shows regular sinus rhythm with occasional PVCs.   ASSESSMENT:  Possible absence seizure versus medication-induced absence-  like status.  This could even occur after morphine was given and in some  patients occurs when antibiotics that are blood brain barrier penetrant  are given.  The EEG will be read today.  A quick overview has not shown   epileptiform discharges.  There is no evidence now of any focal deficit  and I would suggest 1 extra dose of Trileptal to be given today.  I do  not see any today for a CT scan.  I am awaiting Dr. Ferne Coe notes to  be faxed from the neurology office.  I think that the patient can  progress to rehab or whatever was scheduled for her to follow her  surgery as originally intended.  There should be no delay of her  hospitalization.      Melvyn Novas, M.D.  Electronically Signed     CD/MEDQ  D:  04/04/2008  T:  04/05/2008  Job:  [id number]   cc:   Jene Every, M.D.  Fax: [phone]   Marolyn Hammock. Thad Ranger, M.D.  Fax: [phone]

## 2010-11-09 NOTE — Consult Note (Signed)
[name], [name]                 ACCOUNT NO.:  [id number]   MEDICAL RECORD NO.:  [id number]          PATIENT TYPE:  INP   LOCATION:  1235                         FACILITY:  [facility]   PHYSICIAN:  Hettie Holstein, D.O.    DATE OF BIRTH:  December 27, 1940   DATE OF CONSULTATION:  04/04/2008  DATE OF DISCHARGE:                                 CONSULTATION   PRIMARY CARE PHYSICIAN:  Della Goo, MD   PRIMARY NEUROLOGIST:  Marolyn Hammock. Thad Ranger, MD   REQUESTING PHYSICIAN:  Orthopedic surgery, Dr. Shelle Iron.   REASON FOR CONSULTATION:  Alteration mental status.   HISTORY OF PRESENTING ILLNESS:  [name] is a very pleasant well-  functioning 70 year old female who presented on April 03, 2008, for an  elective right total knee arthroplasty who had done quite well without  problems reported.  However on postoperative day #1 was reported on to 2  occasions have had episodes of alteration in her consciousness and  mental status.  These episodes were described by the ICU staff as the  patient suddenly developing a blank stare, mild drop and decrease in  respirations and appearance of falling asleep and closing her eyes and  was no tonic-clonic movements described and no loss of bowel or bladder  continence and brief recovery in any event.  It was described that she  has prior history of seizure disorder managed by Dr. Thad Ranger in the  outpatient setting typically on Trileptal 150 mg.  The patient described  not feeling right and apparently had a seizure last reported in February  2008.  In any event, Dr. Vickey Huger of Neurology is consulted.  She has  undergone an EEG, which results are currently pending.  She has no other  complaints of chest pain, shortness of breath, nausea, or vomiting.  Her  intraoperative record was reviewed appeared to be uneventful.  There is  some mild but very brief and transient episodes of systolic below 100.  Her med profile revealed that she had been receiving  hydromorphone PCA.  Her blood glucoses have been 133, 157, 173, 183, and 195 respectively.  No signs of hypoglycemia.   PAST MEDICAL HISTORY:  Significant for diabetes, hypertension,  gastroesophageal reflux disease, hyperlipidemia, morbid obesity,  depression and anxiety.  She has undergone cardiac catheterization most  recently in 2007 by Dr. Samule Ohm which revealed minimal atherosclerotic  coronary disease to the circumflex with preserved ejection fraction.   PAST SURGICAL HISTORY:  Significant for proximal hysterectomy,  cholecystectomy.  She retains her appendix.  Tonsillectomy and  adenoidectomy as youth.   MEDICATIONS:  Her medications as per medical reconciliation include,  1. Alprazolam 0.5 mg p.o. b.i.d. as needed.  2. Klor-Con 20 mg p.o. nightly.  3. Benicar/hydrochlorothiazide 20/12.5 mg p.o. q.p.m.  4. Metoclopramide 10 mg t.i.d.  5. Ranitidine 300 mg q.a.m. nightly.  6. Glipizide 5 mg p.o. q.a.m.  7. Tegretol 150 mg 1-1/2 tabs in the a.m. and p.m.  8. Aspirin 81 mg every other day.  9. Vitamin E 200 international unit every 2 days.  10.Flaxseed oil 1 g daily.  ALLERGIES:  PENICILLIN.   SOCIAL HISTORY:  She is a former smoker, quit in 1989.  She does not  drink.  She is retired.   FAMILY HISTORY:  Noncontributory.   REVIEW OF SYSTEMS:  She has been in her usual state of health, this is  essentially unremarkable.  No chest pain, shortness of breath, nausea,  vomiting, diarrhea, fevers, chills, or night sweats.  Comprehensive  review is negative.   PHYSICAL EXAMINATION:  VITAL SIGNS:  Stable.  Her heart rate was in the  80s.  She is afebrile.  HEENT:  Head had been normocephalic and atraumatic.  Extraocular muscles  were intact.  NECK:  Supple.  Nontender.  No hepatosplenomegaly or mass.  CARDIOVASCULAR:  Normal S1 and S2.  LUNGS:  Clear to auscultation with normal effort.  No dullness to  percussion.  ABDOMEN:  Soft and nontender without rebounding or  guarding.  LOWER EXTREMITIES:  No edema.  NEUROLOGIC:  No focal deficits.  Strength is +5/5 upper and lower  extremities and symmetrical.  Her grip strength is symmetrical.  She has  no facial asymmetry.  Her speech is clear and deep tendon reflexes are  +2/4 with respect to her upper and left lower extremities.  Right is  postoperative.   LABORATORY DATA:  Sodium 136, potassium 3.7, BUN 20, creatinine 0.75,  and glucose 154.  WBC of 10, hemoglobin 12, platelet count 213, MCV of  92, and INR is 1.1.  Her EEG results are currently pending.   ASSESSMENT:  Transient alteration of mental status, etiology is not  absolutely clear, certainly could be medication related including  Dilaudid PCA perhaps diphenhydramine 12.5 administered this morning.  Dr. Vickey Huger of Neurology is consulted and will follow up with EEG and  will follow her recommendations with a reference to increase in her  antiseizure medication and would continue deep vein thrombosis  prophylactic measures.   Thank you for this consultation.      Hettie Holstein, D.O.  Electronically Signed     Hettie Holstein, D.O.  Electronically Signed    ESS/MEDQ  D:  04/04/2008  T:  04/05/2008  Job:  [id number]

## 2010-11-09 NOTE — Discharge Summary (Signed)
[name], [name]                 ACCOUNT NO.:  [id number]   MEDICAL RECORD NO.:  [id number]          PATIENT TYPE:  INP   LOCATION:  3703                         FACILITY:  [facility]   PHYSICIAN:  Ladell Pier, M.D.   DATE OF BIRTH:  08-Apr-1941   DATE OF ADMISSION:  08/04/2007  DATE OF DISCHARGE:  08/05/2007                               DISCHARGE SUMMARY   DISCHARGE DIAGNOSES:  1. Chest pain.  2. Shoulder pain.  3. Diabetes.  4. Hypertension.  5. Gastroesophageal reflux disease.  6. Hyperlipidemia.  7. Osteoarthritis.  8. Left shoulder injury status post fall in 2008.  9. Obstructive sleep apnea for which she uses CPAP.  10.Seizure disorder.   DISCHARGE MEDICATIONS:  1. Vicodin 5/500 q.6 h. p.r.n. for pain, #30 with zero refills.  2. Benicar 20/12.5 daily.  3. Ranitidine 300 mg twice daily.  4. Aspirin 81 mg daily.  5. Glipizide 5 mg daily.  6. Oxcarbazepine 150 mg, one and a half twice daily.   FOLLOWUP APPOINTMENTS:  The patient is to follow up with Dr. Lovell Sheehan  regarding shoulder pain.  We will try to get an MRI prior to discharge,  if not, he will get it outpatient.   PROCEDURES:  None.   CONSULTATIONS:  None.   HISTORY OF PRESENT ILLNESS:  The patient is a 70 year old African  American female that has been admitted multiple times with chest pain.  She had a cardiac catheterization in 2007, cardiac workup in January and  also in April 2008.  She came in today complaining of chest pain that  woke her up at 3:30 a.m. in the morning.  She complained of chest wall  tenderness and tenderness in the left shoulder.  The left shoulder pain  started about 2 weeks ago.  She can hardly lift her shoulder.  She also  stated that the chest pain also felt like a heaviness and lasted 20  minutes each time it comes on.  The episodes were intermittent.  She did  have some shortness of breath and diaphoresis with the pain.  She also  had some nausea.  She is unable to lift her arm  secondary to the pain.  The pain in her chest is about 9 out of 10.  She also complains of some  numbness in her cheeks.  She recently had an MRI of her head in 2008  secondary to weakness and numbness of the left side.   PAST MEDICAL HISTORY:  Furnished in H&P.   FAMILY HISTORY:  Furnished in H&P.   SOCIAL HISTORY:  Furnished in H&P.   MEDICATIONS:  Furnished in H&P.   ALLERGIES:  Furnished in H&P.   REVIEW OF SYSTEMS:  Furnished in H&P.   DISCHARGE PHYSICAL EXAMINATION:  VITAL SIGNS:  Temperature 97.2, pulse  67, respirations 20, blood pressure 108/68, pulse ox 99% on 2 liters,  CBC 122.  HEENT:  Head is normocephalic atraumatic.  Pupils reactive to light.  Throat without erythema.  CARDIOVASCULAR:  Regular rate rhythm.  LUNGS:  Clear bilaterally.  ABDOMEN:  Positive bowel sounds.  EXTREMITIES:  Without edema.  NEUROLOGIC:  Nonfocal, except that she is tender in the chest wall area  on the left and tender in the shoulder area.  She is unable to lift her  left arm secondary to pain.  She is weaker in the left arm secondary to  pain.   HOSPITAL COURSE:  1. Chest pain.  The patient was admitted to observation.  She ruled      out with enzymes and EKG D-dimer normal.  She will follow up with      her primary care doctor tomorrow.  The primary care doctor will      determine if she needs a stress test or not.  2. Shoulder pain.  The x-ray of her shoulder is negative.  Not sure if      she could have a rotator cuff problem or a bursitis.  We will      schedule her for an MRI inpatient.  If she does not get it      inpatient, then she will follow up outpatient for the MRI.  3. Diabetes.  Continue her on her home medications.  Her blood sugars      remain stable.  4. Seizure disorder.  Continue her on her home medications.  She had      no episode of seizure while inpatient.  5. Dyslipidemia.  The patient's cholesterol is elevated.  We will      defer treatment to her primary  care physician.   DISCHARGE LABORATORY:  Cardiac enzymes:  Troponin 0.02, CK 175. MB 2.3,  relative index 1.3.  Total cholesterol 236, triglycerides 263, HDL 53,  LDL 130.  Hemoglobin A1c 7.7.  TSH 3.323.  D-dimer  0.22.  Shoulder x-  ray:  Minimal acromial spurring, no acute bony abnormality.  Chest x-  ray:  No acute cardiopulmonary findings.      Ladell Pier, M.D.  Electronically Signed     NJ/MEDQ  D:  08/05/2007  T:  08/06/2007  Job:  [id number]   cc:   Della Goo, M.D.

## 2010-11-12 NOTE — Cardiovascular Report (Signed)
[name], [name]                 ACCOUNT NO.:  [id number]   MEDICAL RECORD NO.:  [id number]          PATIENT TYPE:  INP   LOCATION:  3709                         FACILITY:  [facility]   PHYSICIAN:  Charlies Constable, M.D. [facility] DATE OF BIRTH:  Dec 28, 1940   DATE OF PROCEDURE:  08/20/2004  DATE OF DISCHARGE:                              CARDIAC CATHETERIZATION   CARDIAC CATHETERIZATION:   CLINICAL HISTORY:  [name] is 70 years old and has diabetes and was  admitted with chest pain which was felt to be somewhat atypical for  ischemia.  Because of her risk factor profile and persistent symptoms, a  decision was made to evaluate with coronary angiography after she was seen  in consultation by Dr. Diona Browner.   PROCEDURE:  The procedure was performed through the right femoral artery  using an arterial sheath and 6 French preformed coronary catheters.  A front  wall arterial puncture was performed and Omnipaque contrast was used.  The  right femoral artery was closed with Angioseal at the end of the procedure.  The patient tolerated the procedure well and left the laboratory in  satisfactory condition.   RESULTS:  The aortic pressure was 90/55 with a mean of 70, and the left  ventricular pressure was 90/17.   Left main coronary artery:  The left main coronary artery was free of  significant disease.   Left anterior descending artery:  The left anterior descending artery gave  rise to two septal perforators.  There was a large optional diagonal branch  or ramus that was free of significant disease.   Circumflex artery:  The circumflex artery gave rise to an atrial branch and  two posterolateral branches.  There was 30% ostial narrowing in the  circumflex artery.   Right coronary artery:  The right coronary artery is a moderate size vessel  that gave rise to two right ventricular branches, a posterior descending  branch and two posterolateral branches.  These vessels were free of  significant  disease.   Left ventriculogram:  The left ventriculogram was performed in the RAO  projection showed good wall motion with no areas of hypokinesis.  The  estimated ejection fraction was 60%.   CONCLUSION:  Minimal nonobstructive coronary artery disease with 30% ostial  narrowing in the circumflex artery, no significant obstruction in the left  anterior descending and right coronary arteries, and normal left ventricular  function.   RECOMMENDATIONS:  Reassurance.  In view of these findings, I do not think  the patient's symptoms are due to a cardiac cause.  She should be able to go  home later today from a cardiac standpoint.      BB/MEDQ  D:  08/20/2004  T:  08/20/2004  Job:  [id number]   cc:   Della Goo, M.D.  [address] [place]  [place] [address]  Fax: [phone]   Jonelle Sidle, M.D. [facility] TEAM B

## 2010-11-12 NOTE — H&P (Signed)
NAMELAFONDA, PATRON                           ACCOUNT NO.:  [id number]   MEDICAL RECORD NO.:  [id number]                   PATIENT TYPE:  INP   LOCATION:  1827                                 FACILITY:  [facility]   PHYSICIAN:  Casimiro Needle L. Thad Ranger, M.D.           DATE OF BIRTH:  [birth year]   DATE OF ADMISSION:  11/21/2002  DATE OF DISCHARGE:                                HISTORY & PHYSICAL   CHIEF COMPLAINT:  Recurrent seizures.   HISTORY OF PRESENT ILLNESS:  This is the initial inpatient [facility] admission for this 70 year old woman with a past medical history  which includes diabetes and a single generalized seizure in April of last  year. She was in her usual state of good health until this morning when she  awoke feeling nauseated. According to her husband's report she went to the  bathroom and vomited at about 7 a.m. She went back to bed. Her husband then  got up and while he was out of the room she apparently got up, went to the  bathroom, and vomited again. He then heard an unusual cry and subsequently  found her in the bathroom, on the floor, with her arms flexed and  rhythmically jerking and her head turned to the right. He states this lasted  about 10 or 15 minutes and afterwards she was unarousable and apparently had  bitten her tongue. She was subsequently brought to the emergency room and  while here had a second similar episode of generalized seizure activity with  head turned to the right. She received Ativan and Dilantin and was  subsequently admitted for prolonged postictal state. She presently is sleep,  but somewhat arousable. She complains of a little bit of nausea, along with  some left shoulder pain.   PAST MEDICAL HISTORY:  History of diabetes, noninsulin dependent. She had a  single seizure in April of last year. Was seen in the ER at that time, but  apparently did not have any subsequent workup and did not see a neurologist.  She also has some  osteoarthritis. There is no known history of stroke or  closed head injury.   FAMILY HISTORY:  Negative for seizures.   SOCIAL HISTORY:  She is married and lives with her husband. 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 She does  not use tobacco or alcohol.   ALLERGIES:  PENICILLIN.   MEDICATIONS:  1. Nexium 40 mg daily.  2. Arthrotec 75 mg b.i.d.  3. Glucophage 500 mg b.i.d.  4. Potassium 20 mEq daily.  5. Aldactone 25 mg daily.  6. Estrogen.   REVIEW OF SYMPTOMS:  She complains of a headache at this time, which started  this morning. She also had nausea and vomiting as above and does have some  vague epigastric pain. Her husband notices some lightening of the skin on  her  legs for about a month. Pinpoint review of systems is otherwise  unremarkable.   PHYSICAL EXAMINATION:  VITAL SIGNS:  Temperature 98.3, blood pressure  143/74, pulse 114, respirations 20.  GENERAL/MENTAL STATUS:  She is stuporous and arouses to voice at least  briefly. She is oriented to place and time and is able to follow one or two  step commands. He speech is not particularly dysarthric.  HEENT:  Cranial nerves normocephalic, atraumatic. Oropharynx is benign.  NECK:  Supple. There is no carotid bruit.  HEART:  Tachycardic. Regular rhythm, no murmurs.  CHEST:  Clear to auscultation.  ABDOMEN:  Soft. Normal active bowel sounds and no tenderness. There is no  hepatomegaly.  EXTREMITIES:  2+ pulses, no edema.  NEUROLOGIC:  Fundi are benign. Pupils are equal and briskly reactive.  Extraocular movements intact without nystagmus. Face, tongue, and palette  move normally and symmetrically. Motor:  Normal bulk and tone. Normal  strength. Normal muscle sensation intact to light touch. Reflexes are  symmetric. Toes are downgoing. Finger to nose is performed adequately. Gait  is deferred.   LABORATORY DATA:  CMET is remarkable only for an elevated glucose of 134.  CBC  white count 13.3, hemoglobin 13.7, platelets 281,000. Urinalysis  remarkable for mild proteinuria. Urine drug screen is negative. Lipase is  normal. CT of the head is personally reviewed and is unremarkable.   IMPRESSION:  Recurrent seizures. She had an episode of two generalized major  motor seizures today, which are apparently unprovoked. She had a history of  another seizure approximately a year ago. 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 She presently has a prolonged postictal state. She has  had no workup for this problem.   PLAN:  We will admit for management of her postictal state. While here we  will get an MRI and EEG to workup for presumed new onset epilepsy. May be  ready for discharge in the morning.                                               Michael L. Thad Ranger, M.D.    MLR/MEDQ  D:  11/21/2002  T:  11/21/2002  Job:  [id number]   cc:   Sharyn Dross., M.D.  [address]  [address]  [place]  [place] [address]  Fax: [phone]

## 2010-11-12 NOTE — H&P (Signed)
Kristin, Coffey                 ACCOUNT NO.:  [id number]   MEDICAL RECORD NO.:  [id number]          PATIENT TYPE:  INP   LOCATION:  1824                         FACILITY:  [facility]   PHYSICIAN:  Hettie Holstein, D.O.    DATE OF BIRTH:  06-27-1941   DATE OF ADMISSION:  08/18/2004  DATE OF DISCHARGE:                                HISTORY & PHYSICAL   PRIMARY CARE PHYSICIAN:  Della Goo, M.D.   CHIEF COMPLAINT:  Left-sided chest pain and left arm numbness.   HISTORY OF PRESENT ILLNESS:  [name] is a pleasant 70 year old African  American female with a history of remarkable for diabetes mellitus on  oral  therapy as well as seizure disorder and obesity. Over the past several days,  she has been having chest pain she describes as heavy with radiation to the  left arm and numbness of her fingers as well as accompanying shortness of  breath. Initially, this occurred at rest several days ago. She stated that  this was exacerbated by external pressure placed above her left breast. She  stated that today she was at rest, experienced quite heavy pain of a 10 out  of 10 in intensity. She arrived at the emergency department and was given  nitroglycerin. She stated that the pain got some better and migrated to  beneath her left breast. She currently complains of a 9 out of 10 pain and a  little bit of shortness of breath. She had some accompanying nausea and no  vomiting, no abdominal pain, and no fevers. She reports some headache but  this did not occur until after she was given nitroglycerin. In addition, she  does report compliance with her diabetes as well as antihypertensive  treatments.   In the emergency department, EKG was abnormal and it revealed what appeared  to be sinus rhythm with premature atrial complexes with aberrancy. Her  initial point of care markers were negative. She is being admitted for  further evaluation and management.   PAST MEDICAL HISTORY:  1.  As noted  above, history of diabetes mellitus noninsulin requiring.  2.  History of seizure disorder. She has had two episodes in the past. This      is determined to be idiopathic as she is evaluated by Dr. Deanna Artis.      [place] previously.  3.  History of hypertension.   PAST SURGICAL HISTORY:  1.  She has a history of hysterectomy. She retains her ovaries.  2.  She has had a cholecystectomy.  3.  Appendectomy.   SOCIAL HISTORY:  She is married. She has two children who are alive, healthy  and well. She quit tobacco approximately 20 years ago. Drinks occasional  alcohol. She is retired from [employer].   FAMILY HISTORY:  Mother is 48 years old with multiple medical problems,  rheumatoid arthritis, vasculitis, as well as lymphoma, and a recent hospital  discharge with a C. difficile colitis. Father died at age 20 with a massive  heart attack.   MEDICATIONS:  1.  Glucophage 500 mg q.d.  2.  Keppra 250 mg q.d.  3.  Aciphex 20 mg q.d.  4.  Crestor 10 mg q.d.  5.  Benicar/HCTZ 20/12.5 mg q.d.  6.  Actos 15 mg q.d.  7.  Chlorocon 20 mg q.d.   ALLERGIES:  She reports allergy to PENICILLIN.   REVIEW OF SYMPTOMS:  She reports some nausea with the above complaints. She  reports no weight loss. She does report some constipation. She denies any  palpitations or diaphoresis, abdominal bloating, pain, or dysuria. She  denies hematochezia, melena, or coffee-ground emesis. She denies swelling of  her lower extremities. The patient is not up-to-date on her mammogram and  has not had a colonoscopy.   PHYSICAL EXAMINATION:  VITAL SIGNS:  Her blood pressure is 111/46, heart  rate is 75, temperature 97.2.  GENERAL:  She does appear alert and in no acute distress. Her pain does  appear to be reproducible as a pressure over her left breast.  CARDIOVASCULAR:  Normal S1 and S2 and symmetrical pulses bilaterally.  LUNGS:  Clear.  ABDOMEN:  Soft and nontender.  EXTREMITIES:  No edema.  NEUROLOGICAL:  She is  euthymic and her affect is stable.   LABORATORY DATA:  Sodium 140, potassium 3.3, BUN 11, creatinine 0.9, and  glucose 93. ACT within normal limits. WBC 8.2, hemoglobin 12.9, platelet  count 247,000. MCV of 88.   EKG revealed sinus rhythm with PACs and aberrancy per my interpretation  (formal cardiologist interpretation pending). Chest x-ray revealed no acute  disease. Point of care markers were negative at 20 and 36.   ASSESSMENT:  1.  Chest pain, atypical with multiple risk factors: No evidence of acute      myocardial infarction at present.  2.  Abnormal electrocardiogram: Premature atrial contractures with aberrancy      versus bigeminy.  3.  Obesity.  4.  Diabetes mellitus.  5.  Transient low blood pressure, suspect mild volume depletion.  6.  Constipation.   PLAN:  At this time, we are going to admit [name] to a telemetry floor.  Follow her course closely. We reviewed her chest x-ray and there appears to  be no evidence of widened mediastinum and her peripheral pulses are  symmetrical. She appears to be stable at this time. She is going to need to  be monitored closely on telemetry and we will start her, due to her multiple  risk factors and ongoing chest pain, cardiac heparin drip per pharmacy.  Follow her EKG and cycle her cardiac markers. Check a d-dimer as well as a  BNP. Follow a 2-D echocardiogram and place her  on aspirin as well as her home medications. We will hold her diuretics for  now as well as her Glucophage in anticipation she may undergo further  studies. If she does rule out, she will likely need further risk  stratification studies and [facility] can be notified in the morning  unless she develops some complications overnight.      ESS/MEDQ  D:  08/18/2004  T:  08/18/2004  Job:  [id number]   cc:   Della Goo, M.D.  [address] [place]  [place] [address]  Fax: [phone]

## 2010-11-12 NOTE — Consult Note (Signed)
[name]                 ACCOUNT NO.:  [id number]   MEDICAL RECORD NO.:  [id number]          PATIENT TYPE:  INP   LOCATION:  3741                         FACILITY:  [facility]   PHYSICIAN:  Rollene Rotunda, MD, FACCDATE OF BIRTH:  Nov 15, 1940   DATE OF CONSULTATION:  07/08/2006  DATE OF DISCHARGE:                                 CONSULTATION   PRIMARY CARE PHYSICIAN:  Dr. Della Coffey.   REASON PRESENTATION:  Evaluate patient with chest pain and ventricular  ectopy.   HISTORY OF PRESENT ILLNESS:  The patient is a pleasant 70 year old  African-American female who we have seen in the past.  She had a cardiac  catheterization in 2006 and then again in May 2007.  This was for  evaluation of chest discomfort.  This catheterization is described  below.  In short, there was no obstructive coronary disease.   She fell about 13 days ago.  She did not lose consciousness.  She simply  tripped.  She landed on her left shoulder and hip.  She had some delayed  pain and so the day before this consultation she presented for some x-  rays.  However, in the x-ray department she developed chest discomfort.  This was a severe substernal discomfort.  She had not had this kind of  discomfort before.  There was some radiation up to her epigastric area.  It was heavy.  It was 9/10 in intensity.  There was some associated  nausea.  It did not improve with nitroglycerin in the emergency room.  There were no acute ST-T wave changes.  She was treated with morphine  and says the pain has been only mild but still persistent today.  In  retrospect, she has had no new shortness of breath, PND or orthopnea.  She is active and can bring on these symptoms.   Of note, the patient has frequent ventricular ectopy in a bigeminal  pattern.  However, she does not feel any palpitations.   PAST MEDICAL HISTORY:  Nonobstructive coronary disease (Catheterization  most recently May 2007 with an EF 65%.  There is no  aortic stenosis or  regurgitation.  The LAD was normal, the ramus intermediate was very  large and normal.  The circumflex had 20% stenosis.  The right coronary  artery is normal), hypertension times years, diabetes mellitus x20  years, seizure disorder, gastroesophageal reflux disease, obesity,  hyperlipidemia, osteoarthritis, previous tobacco use,  anxiety/depression, sleep apnea (she is undergoing evaluation for CPAP).   PAST SURGICAL HISTORY:  Cholecystectomy and hysterectomy.   ALLERGIES:  PENICILLIN.   MEDICATIONS:  1. Benicar HCT 20/12.5.  2. Prilosec 20 mg daily.  3. Lyrica 75 mg b.i.d.  4. Aspirin 81 mg a day.  5. Potassium 20 mEq daily.  6. Celexa 20 mg p.r.n.  7. Xanax.  8. Glipizide 5 mg daily.   SOCIAL HISTORY:  The patient is married.  Quit smoking about 20 years  ago.  She rarely drinks alcohol.  She is retired from [employer].   FAMILY HISTORY:  Family history is contributory for her father dying of  myocardial infarction in his 101s.   REVIEW OF SYSTEMS:  As stated in the HPI and negative for other systems.   PHYSICAL EXAMINATION:  GENERAL:  The patient is in no distress.  VITAL SIGNS:  Blood pressure is 111/69, heart rate 54 and regular,  afebrile, oxygen saturation 100% on 2 liters.  HEENT:  Eyes are unremarkable, pupils equal, round and react to light,  fundi not visualized, oral mucosa unremarkable.  NECK:  No jugular distension, wave form within normal limits, carotid  upstroke brisk and symmetrical, no bruits, no thyromegaly.  LYMPHATICS:  No cervical, axillary or inguinal adenopathy.  LUNGS:  Clear to auscultation bilaterally.  BACK:  No costovertebral angle tenderness.  CHEST:  Unremarkable.  HEART:  PMI not displaced or sustained, S1-S2 within normal limits, no  S3, no S4, no clicks, no rubs, no murmurs.  ABDOMEN:  Flat, positive bowel sounds normal in frequency and pitch, no  bruits, no rebound, no guarding, no midline pulsatile mass, no  hepatomegaly,  no splenomegaly.  SKIN:  No rashes, no nodules.  EXTREMITIES:  2+ pulses, no edema, no cyanosis or clubbing.  NEURO:  Oriented to person, place, time, cranial nerves II-XII grossly  intact, motor grossly intact.   ELECTROCARDIOGRAM:  Sinus rhythm, rate 79, axis within normal limits,  intervals within normal limits, premature ventricular contractions, no  acute ST-wave changes.   ASSESSMENT/PLAN:  1. Chest.  The patient's chest discomfort is atypical.  I have      reviewed all of the labs.  Her D-dimer is negative.  Her cardiac      enzymes x3 including troponins are negative.  She had no      obstructive coronary disease and catheterization less than 12      months ago.  There are no acute EKG changes.  Given all of this the      pretest probability of obstructive coronary disease as the cause is      extremely small.  No further cardiovascular testing is suggested.      Perhaps a gastrointestinal evaluation would be warranted.  2. Premature ventricular contractions.  The patient does have these      and at times in a bigeminal pattern.  I did look back to 2006 when      she had echocardiogram demonstrating normal left ventricular      function.  She did have some mild left ventricular thickness.      However, given the normal appearance of this echo and the fact that      these are totally asymptomatic no further workup is warranted other      than to make sure that she has had a TSH, BMET, and I would also      include a magnesium.      Rollene Rotunda, MD, [facility]  Electronically Signed     JH/MEDQ  D:  07/08/2006  T:  07/09/2006  Job:  [id number]   cc:   [name], M.D.

## 2010-11-12 NOTE — H&P (Signed)
[name], GOERING                 ACCOUNT NO.:  [id number]   MEDICAL RECORD NO.:  [id number]          PATIENT TYPE:  INP   LOCATION:  1416                         FACILITY:  [facility]   PHYSICIAN:  Della Goo, M.D. DATE OF BIRTH:  [birth year]   DATE OF ADMISSION:  10/22/2006  DATE OF DISCHARGE:                              HISTORY & PHYSICAL   CHIEF COMPLAINT:  Shortness of breath, chest heaviness and left-sided  weakness.   HISTORY OF PRESENT ILLNESS:  This is a 70 year old female who presents  to the emergency department secondary to complaints of shortness of  breath, chest heaviness and left arm weakness, numbness that she reports  has been going on for the last day.  She reports the numbness in her  left arm is worsening.  She is also beginning to have weakness in the  left side of her face.  The patient describes also having nausea, no  vomiting, associated with her symptoms.   PAST MEDICAL HISTORY:  1. Type 2 diabetes mellitus.  2. Hypertension.  3. Gastroesophageal reflux disease.  4. Hyperlipidemia.  5. Osteoarthritis.  6. Previous left shoulder injury, status post fall.  7. Obstructive sleep apnea.   Of note, the patient had a cardiac workup in January2008 as well as a  cardiac catheterization in [id number], which did reveal nonobstructive  coronary artery disease and an ejection fraction of 65%.Marland Kitchen   PAST SURGICAL HISTORY:  1. Status post cholecystectomy.  2. Status post hysterectomy   ALLERGIES:  PENICILLIN.   MEDICATIONS:  1. Glipizide 5 mg one p.o. b.i.d.  2. Ranitidine 300 mg one p.o. daily.  3. Lyrica 100 mg one p.o. b.i.d.  4. Aspirin 81 mg one p.o. daily.  5. Benicar/HCTZ 20/12.5 mg one p.o. daily.  6. Klor-Con 20 mEq one p.o. daily.  7. Multivitamin one p.o. daily.   SOCIAL HISTORY:  The patient is married.  Past history of tobacco abuse  and rare alcohol usage.   FAMILY HISTORY:  Positive for coronary artery disease in her father who  died of MI  at age 66.   PHYSICAL EXAMINATION:  GENERAL:  A  70 year old, mildly overweight  female in discomfort, but no acute distress.  VITAL SIGNS:  Temperature 99.2, blood pressure 133/83, heart rate 71,  respirations 20.  HEENT:  Normocephalic, atraumatic.  No scleral icterus.  Pupils equally  round and reactive to light.  Extraocular muscles are intact.  Funduscopic benign.  Oropharynx is clear.  NECK:  Supple.  Full range of motion.  No thyromegaly, adenopathy or  jugular venous distension.  CARDIOVASCULAR:  Regular rate and rhythm.  LUNGS:  Clear to auscultation bilaterally.  ABDOMEN:  Positive bowel sounds, soft, nontender, nondistended.  EXTREMITIES:  Without cyanosis, clubbing or edema.  NEUROLOGIC:  Alert and oriented x3.  There are no focal deficits on  examination.   LABORATORY STUDIES:  White blood cell count 8.8, hemoglobin 13.3,  hematocrit 38.7, platelets 259,000, neutrophils 50%, lymphocytes 42%.  Sodium 140, potassium 3.7, chloride 103, CO2 27, BUN 7, creatinine 0.77,  glucose 121,  calcium 10.3, phosphorus 3.5, magnesium 2.1.  First  cardiac enzymes CK total 187, CK-MB 2.1, relative index 1.1, troponin  0.02.   ASSESSMENT:  A 70 year old female complaining of shortness of breath,  chest discomfort and left-sided weakness being admitted with;  1. Atypical chest pain.  2. Left-sided weakness.  3. Type 2 diabetes mellitus.  4. Hypertension   PLAN:  The patient has been admitted and placed on telemetry.  Cardiac  enzymes will be performed  q.8 h x3.  The patient will be placed on  nitrates, oxygen, aspirin therapy for now.  The patient will continue  her regular medications and have p.r.n. pain medication and antiemetic  medications as needed.  A head CT has been performed and a CT of the C-  spine will also be considered secondary to her previous left shoulder  injury.      Della Goo, M.D.  Electronically Signed     HJ/MEDQ  D:  10/22/2006  T:  10/23/2006   Job:  [id number]   cc:   Della Goo, M.D.  Fax: [phone]

## 2010-11-12 NOTE — Discharge Summary (Signed)
[name], [name]                 ACCOUNT NO.:  [id number]   MEDICAL RECORD NO.:  [id number]          PATIENT TYPE:  INP   LOCATION:  3709                         FACILITY:  [facility]   PHYSICIAN:  Jonna L. Robb Matar, M.D.DATE OF BIRTH:  1941/03/03   DATE OF ADMISSION:  08/18/2004  DATE OF DISCHARGE:  08/20/2004                                 DISCHARGE SUMMARY   FINAL DIAGNOSES:  1.  Chest pain.  2.  Type 2 diabetes.  3.  History of seizure disorder.  4.  Hypertension.  5.  Obesity.   PRIMARY CARE PHYSICIAN:  Della Goo, M.D.   CARDIOLOGIST:  Dr. Diona Browner.   PROCEDURE:  Cardiac catheterization.   ALLERGIES:  PENICILLIN.   CODE STATUS:  Full.   HISTORY:  A 70 year old overweight African-American female with risk factors  of diabetes and hypertension who complained of chest pain radiating to the  left arm, numbness of the fingers and shortness of breath.  EKG showed PACs  and negative cardiac enzymes.  Initial physical exam was unremarkable as was  her initial laboratory work except for potassium of 3.3.   The patient was admitted to telemetry.  Chest x-ray was unremarkable.  D-  dimer and BNP were normal.  Cardiac catheterization was done and was normal.   DISPOSITION:  The patient is discharged on aspirin 81 daily,  Metformin 500  daily, Keppra 750 daily,  Aciphex 20 daily, Crestor 10 daily, Benicar HCTZ  20/12.5 daily, Actos 15 daily.  All of these are her previous medications.  She is not to do any heavy lifting or driving for two days.  She is to be on  a diabetic diet, avoid salt and greasy foods.  She is to follow up with her  primary care physician.      Jonna L. Robb Matar, M.D.  Electronically Signed     JLB/MEDQ  D:  02/15/2005  T:  02/15/2005  Job:  [id number]

## 2010-11-12 NOTE — H&P (Signed)
NAMEBRIANNI, MANTHE                 ACCOUNT NO.:  [id number]   MEDICAL RECORD NO.:  [id number]          PATIENT TYPE:  EMS   LOCATION:  MINO                         FACILITY:  [facility]   PHYSICIAN:  Hillery Aldo, M.D.   DATE OF BIRTH:  Feb 02, 1941   DATE OF ADMISSION:  07/07/2006  DATE OF DISCHARGE:                              HISTORY & PHYSICAL   PRIMARY CARE PHYSICIAN:  Dr. Lovell Sheehan.   CARDIOLOGIST:  Dr. Daleen Squibb.   NEUROLOGIST:  Dr. Thad Ranger.   CHIEF COMPLAINT:  Left shoulder pain, chest pain.   HISTORY OF PRESENT ILLNESS:  The patient is a 70 year old female who  came in for evaluation of musculoskeletal type pain in the left shoulder  and hips secondary to a fall.  The patient reports that she fell  approximately 2 weeks ago.  She simply lost her footing on a slippery  surface.  She did not have any syncope.  She had left shoulder pain  after the fall and tried using Darvocet for pain control.  She presented  here today secondary to ongoing problems with pain and while here  developed the sudden onset of substernal chest pain accompanied by  shortness of breath.  The patient states that she has had intermittent  chest pain for the past month.  She has also had intermittent dyspnea.  She describes the pain as a heavy sensation.  The pain has not been  relieved by sublingual nitroglycerin although narcotic- based pain  medications have eased it off some.  The patient had a negative cardiac  catheterization in May of 2007.  Nevertheless, during her evaluation  here, she has had multiple PVCs on telemetry and is therefore being  admitted for further evaluation and workup.   PAST MEDICAL HISTORY:  1. Type 2 diabetes.  2. Hypertension.  3. Seizure disorder.  4. Depression.  5. Obstructive sleep apnea, recently diagnosed.  6. Gastroesophageal reflux disease.  7. Obesity.  8. Hyperlipidemia.  9. Osteoarthritis.  10.Anxiety.   PAST SURGICAL HISTORY:  1. Hysterectomy.  2.  Cholecystectomy.  3. Appendectomy.   FAMILY HISTORY:  The patient's father died at 77 of a massive MI.  Mother is alive at 43 and has rheumatoid arthritis, vasculitis and  lymphoma.  She does not have any heart disease.  The patient has 3  siblings, none of whom suffer with heart disease.   SOCIAL HISTORY:  The patient is married.  She quit smoking over 20 years  ago.  She uses alcohol on rare social occasions.  She is retired from  [employer].   ALLERGIES:  PENICILLIN.   CURRENT MEDICATIONS:  1. Benicar 20/12.5 one tablet daily.  2. Prilosec 20 mg daily.  3. Lyrica 75 mg b.i.d.  4. Aspirin 81 mg daily.  The patient recently discontinued aspirin      therapy at the direction of her physicians.  5. Potassium chloride 20 mEq daily.  6. Celexa 20 mg every other day p.r.n.  7. Xanax 0.25 mg b.i.d.  8. Glipizide 5 mg daily.   REVIEW OF SYSTEMS:  The patient denies any fever.  She states I have  had a few chills.  Her appetite has been fair.  Weight has been stable.  She has some chronic underlying dyspnea of uncertain etiology, likely  secondary to her obstructive sleep apnea.  She denies any recent cough.  She has sluggish bowel movements at baseline.  No melena or  hematochezia.  She occasionally reports excessive thirst and polyuria,  but thinks the polyuria is related to her diuretic dose.  She has had  musculoskeletal pain in her left shoulder and her hips secondary to  fall.   PHYSICAL EXAMINATION:  VITAL SIGNS:  Temperature 97, pulse 60,  respirations 18, blood pressure 112/50, O2 saturation 98% on room air.  GENERAL:  Obese female in no acute distress.  HEENT:  Normocephalic, atraumatic.  Pupils equal, round and reactive to  light.  Extraocular movements intact.  Oropharynx is clear.  NECK:  Supple.  No thyromegaly, no lymphadenopathy, no jugular  distension.  CHEST:  Lungs clear to auscultation bilaterally with good air movement.  HEART:  Regular rate and rhythm.  An  occasional skipped beat.  No  murmurs, rubs or gallops.  ABDOMEN:  Soft, nontender, nondistended with normoactive bowel sounds.  EXTREMITIES:  No clubbing, edema or cyanosis.  SKIN:  Warm and dry.  No rashes.  NEUROLOGIC:  Alert and oriented x3.  Nonfocal.   DATA REVIEW:  EKG shows sinus bradycardia.  There are occasional PVCs,  both on one of the EKGs and on telemetry monitoring.  No ST or T-wave  abnormalities.   CHEST X-RAY:  No active cardiopulmonary disease.   LABORATORY DATA:  Sodium is 139, potassium 3.9, chloride 107,  bicarbonate 29, BUN 8, creatinine 0.8, glucose 131.  Point of care  cardiac markers are negative x3.   ASSESSMENT/PLAN:  1. Atypical chest pain.  The patient's chest pain is somewhat      atypical.  She has no history of coronary artery disease, and, in      fact, had a clean cardiac catheterization back in May.      Nevertheless, she does have multiple premature ventricular      contractions on telemetry raising a concern for pulmonary embolism.      We will get a D-dimer, and, if this elevated, follow this up with a      CT angiogram to rule this out.  This may simply be a      musculoskeletal type pain secondary to injury from a fall.  Will      administer pain medications on a p.r.n. basis.  She also could have      esophagitis or gastroesophageal reflux disease that is not      adequately controlled.  I will put her on 40 mg of Protonix daily      to address this possibility.  Although the chance that this is      representative of an acute coronary syndrome is low, I will check      cardiac enzymes q.8h. x3, monitor her on telemetry, and obtain a 2-      D echocardiogram.  2. Hypertension.  The patient is normotensive.  We will continue her      usual dose of Benicar.  3. History of seizure disorder.  Will continue the patient's dose of      Lyrica.  4. Diabetes.  Will check a hemoglobin A1c and continue her on     glipizide.  Will use sliding scale  insulin on a p.r.n.  basis.  5. Prophylaxis.  Will initiate GI prophylaxis with Protonix and deep      vein thrombosis prophylaxis with Lovenox.      Hillery Aldo, M.D.  Electronically Signed     CR/MEDQ  D:  07/07/2006  T:  07/08/2006  Job:  [id number]   cc:   Thomas C. Daleen Squibb, MD, Abbey Chatters L. Thad Ranger, M.D.  Dr. Lovell Sheehan

## 2010-11-12 NOTE — Discharge Summary (Signed)
NAMESIGNORA, ZUCCO                 ACCOUNT NO.:  [id number]   MEDICAL RECORD NO.:  [id number]          PATIENT TYPE:  INP   LOCATION:  2899                         FACILITY:  [facility]   PHYSICIAN:  Salvadore Farber, M.D. LHCDATE OF BIRTH:  1941-01-27   DATE OF ADMISSION:  11/03/2005  DATE OF DISCHARGE:  11/04/2005                                 DISCHARGE SUMMARY   PRIMARY CARE PHYSICIAN:  Della Goo, M.D.   PRIMARY CARDIOLOGIST:  Jesse Sans. Wall, M.D.   PRINCIPAL DIAGNOSIS:  Chest pain.   OTHER DIAGNOSES:  1.  Hypertension.  2.  Type 2 diabetes mellitus.  3.  Seizure disorder.  4.  Gastroesophageal reflux disease.  5.  Obesity.  6.  Hyperlipidemia.  7.  Osteoarthritis.  8.  Remote tobacco abuse.  9.  Status post cholecystectomy.  10. Status post hysterectomy.  11. Anxiety and depression.   ALLERGIES:  PENICILLIN.   PROCEDURE:  Left heart catheterization.   HISTORY OF PRESENT ILLNESS:  A 70 year old African-American female with no  prior history of CAD, who was recently seen in the emergency room on May 5  with complaints of substernal chest heaviness occurring at rest with  radiation to the left arm, associated with shortness of breath.  Point of  care markers were negative and she was subsequently discharged from the ER  and referred for an outpatient cardiac evaluation.  She was seen at [facility] on May 10 and continued to complain of substernal chest heaviness  and pain with exertion associated with shortness of breath and left arm pain  and tingling.  As a result, a decision was made to admit her for cardiac  catheterization.   HOSPITAL COURSE:  Ms. Yott ruled out for MI by cardiac markers x2.  ECG was  without acute changes.  Cardiac catheterization was performed on May 11,  revealing normal coronary arteries and an EF of 65% without regional wall  motion abnormalities.  As a result, she is being discharged home today in  satisfactory condition and  was advised to follow up with her primary care  physician for further evaluation of causes of noncardiac chest pain.   DISCHARGE LABORATORY DATA:  Hemoglobin 12.8, hematocrit 37.0, WBC 8.8,  platelets 230, MCV 91.5.  Sodium 140, potassium 3.5, chloride 103, CO2 28,  BUN 6, creatinine 0.9, glucose 152, total bilirubin 0.6, alkaline  phosphatase 61, AST 25, ALT 19, albumin 4.2.  CK 236, MB 2.7, troponin I  0.02.  Total cholesterol 201, triglycerides 143, HDL 54, LDL 118.  Calcium  9.6.  TSH 1.418.   DISPOSITION:  The patient is being discharged home today in good condition.   FOLLOW-UP PLANS AND APPOINTMENTS:  She is asked to follow up with her  primary care physician, Dr. Della Goo, in one to two weeks.   DISCHARGE MEDICATIONS:  We made no changes to her current regimen.  She will  be going home on:   1.  Aspirin 81 mg daily.  2.  Benicar/HCT 20/12.5 mg daily.  3.  Lyrica 75 mg in the a.m. and  150 mg in the p.m.  4.  Reglan 10 mg daily.  5.  Alprazolam 0.25 mg b.i.d.  6.  Potassium 20 mEq daily.  7.  Citalopram 20 mg every other day.  8.  Multivitamin daily.  9.  Vitamin C daily.  10. Caltrate one tablet daily.  11. Prilosec 20 mg daily.   OUTSTANDING LAB STUDIES:  None.   DURATION OF DISCHARGE ENCOUNTER:  35 minutes, including physician team.      Ok Anis, NP      Salvadore Farber, M.D. [facility]  Electronically Signed    CRB/MEDQ  D:  11/04/2005  T:  11/05/2005  Job:  [id number]   cc:   Della Goo, M.D.  Fax: [phone]

## 2010-11-12 NOTE — Discharge Summary (Signed)
[name], [name]                 ACCOUNT NO.:  [id number]   MEDICAL RECORD NO.:  [id number]          PATIENT TYPE:  INP   LOCATION:  3741                         FACILITY:  [facility]   PHYSICIAN:  Hillery Aldo, M.D.   DATE OF BIRTH:  Apr 01, 1941   DATE OF ADMISSION:  07/07/2006  DATE OF DISCHARGE:  07/09/2006                               DISCHARGE SUMMARY   PRIMARY CARE PHYSICIAN:  Is Dr. Lovell Coffey.   CARDIOLOGIST:  Dr. Daleen Coffey.   NEUROLOGIST:  Dr. Thad Coffey.   DISCHARGE DIAGNOSES:  1. Noncardiac chest pain, likely musculoskeletal.  2. Traumatic fall with left shoulder pain.  3. Ventricular bigeminy and nonsustained ventricular tachycardia.  4. Type 2 diabetes.  5. Hyperlipidemia.  6. Hypertension.  7. Seizure disorder.  8. History of depression and anxiety.  9. Transaminitis.  10.Fatty liver.   DISCHARGE MEDICATIONS:  1. Benicar 20/12.5 one tablets daily.  2. Prilosec 20 mg daily.  3. Lyrica 75 mg b.i.d.  4. Aspirin 81 mg daily.  5. Potassium chloride 20 mEq daily.  6. Celexa 20 mg p.r.n.  7. Xanax 0.25 mg b.i.d. p.r.n.  8. Glipizide 5 mg b.i.d.  9. Percocet 5/325 one to two tablets q.4-6h. p.r.n. pain.   CONSULTATIONS:  Dr. Antoine Poche of cardiology.   BRIEF ADMISSION HPI:  The patient is a 70 year old female with a past  medical history of diabetes who had a traumatic fall 15 days prior to  the presentation in the ER.  She complained of chest pain and heaviness.  She had a catheterization back in May of 2007 which did not show any  significant coronary disease.  I was planning to discharge her from the  emergency department, however, I noticed multiple ventricular ectopy and  PVCs on telemetry monitoring and decided to admit her for further  cardiac evaluation.   PROCEDURES AND DIAGNOSTIC STUDIES:  1. Chest x-ray on July 07, 2006 showed no acute disease.  2. Left hip films three views showed no acute left hip abnormality.      There were chronic findings with a  small bone density adjacent to      the greater femoral trochanter which appeared to be corticated and      possibly due to chronic avulsion injury or chronic calcific      bursitis.  3. Left shoulder, three views on July 07, 2006 showed no fracture      or dislocation.  4. Left knee four views on July 07, 2006 that showed degenerative      osteoarthritic changes.  Suspicion for intra-articular loose body.      No acute left knee bony abnormality.  5. Right upper quadrant ultrasound showed status post cholecystectomy.      Ducts were normal.  The liver had a fatty appearance.   DISCHARGE LABORATORY VALUES:  Sodium was 139, potassium 3.5, chloride  102, bicarb 30, BUN 11, creatinine 0.95, glucose 109, total bilirubin  0.7, alkaline phosphatase 90, AST 60, ALT 93.  White blood cell count  was 7.4, hemoglobin 13.9, hematocrit 40.1, platelets 239.   ASSESSMENT/PLAN:  1. Chest pain:  The patient's chest pain was thought to be      musculoskeletal, but given her ventricular ectopy, she was admitted      for further evaluation.  Pulmonary embolism was ruled out with a      normal D-dimer.  Radiographs did not reveal any evidence of an      intrathoracic process.  Over the course of her hospitalization, her      telemetry monitoring revealed ventricular bigeminy and nonsustained      ventricular tachycardia, three beats.  Given these findings,      cardiology consult was requested and kindly provided by Dr.      Antoine Poche.  It was his feeling that her ventricular ectopy did not      require any further cardiac evaluation given her structurally      normal findings on 2-D echocardiogram in 2006 and negative cardiac      catheterization.  Her TSH was checked and found to be within normal      limits.  Her potassium was normal.  At the time of the dictation,      magnesium has been added, and if it is normal, the patient will be      discharged.  If it is low, she will be discharged on  magnesium      supplementation.  2. Diabetes:  The patient's diabetes is under fair control.      Hemoglobin A1c was checked and found to be 7.3%.  Given this, her      glipizide was increased to b.i.d. dosing.  3. Hypertension:  The patient's blood pressure has remained well-      controlled during the course of her hospitalization.  4. Hyperlipidemia:  The patient did have a fasting lipid panel done      which showed a cholesterol of 191, triglycerides 75, HDL 53, and      LDL 123.  She is encouraged to continue with a heart healthy low-      fat diet.  5. Transaminitis:  The patient's liver enzymes were mildly elevated.      She is not on any medications to explain this finding except      possibly Lyrica.  A right upper quadrant ultrasound did show fatty      liver which could be responsible for her elevation of liver      enzymes.  Acute viral hepatitis panel is pending at the time of      this dictation.  Dr. Lovell Coffey can follow up on these findings as an      outpatient.  6. Seizure disorder:  The patient was without symptoms and maintained      on her usual dose of Lyrica.  7. Depression/anxiety:  The patient received Xanax p.r.n.   DISPOSITION:  The patient is stable for discharge home.  She should  follow up with Dr. Lovell Coffey early next week.  She is instructed to  continue with a diabetic, heart healthy diet.  Dr. Lovell Coffey should follow  up on her acute hepatitis panel as an outpatient.      Hillery Aldo, M.D.  Electronically Signed     CR/MEDQ  D:  07/09/2006  T:  07/09/2006  Job:  [id number]   cc:   Kristin C. Daleen Squibb, MD, [name], M.D.  [name], M.D.

## 2010-11-12 NOTE — Cardiovascular Report (Signed)
[name], [name]                 ACCOUNT NO.:  [id number]   MEDICAL RECORD NO.:  [id number]          PATIENT TYPE:  INP   LOCATION:  2899                         FACILITY:  [facility]   PHYSICIAN:  Salvadore Farber, M.D. LHCDATE OF BIRTH:  1940/11/14   DATE OF PROCEDURE:  11/04/2005  DATE OF DISCHARGE:  11/04/2005                              CARDIAC CATHETERIZATION   PROCEDURE:  Left heart catheterization, left ventriculography, coronary  angiography.   INDICATIONS:  Ms. Diviney is a 70 year old woman with diabetes mellitus and  hypertension who presented to the emergency room on Saturday evening with  substernal chest heaviness.  Symptoms have persisted since then.  She was  admitted to hospital yesterday for further evaluation.  She was ruled out  for myocardial infarction by serial enzymes and electrocardiograms and was  referred for diagnostic angiography.   PROCEDURAL TECHNIQUE:  Informed consent was obtained.  Under 1% lidocaine  local anesthesia, a 5-French sheath was placed in the right common femoral  artery using the modified Seldinger technique.  Diagnostic angiography and  ventriculography were performed using JL-4, JR-4 and pigtail catheters.  The  patient was then transferred to the holding room in stable condition, having  tolerated the procedure well.   COMPLICATIONS:  None.   FINDINGS:  1.  LV:  102/9/14.  EF 65% without regional wall motion abnormality.  2.  No aortic stenosis or mitral regurgitation.  3.  Left main:  Angiographically.  4.  LAD:  Moderate-sized vessel which is angiographically normal.  5.  Ramus intermedius:  Very large branching vessel which is      angiographically normal.  6.  Circumflex:  Moderate-sized vessel giving rise to two obtuse marginals.      There is some tortuosity proximally with perhaps a 20% stenosis.  7.  RCA:  Moderate-sized dominant vessel.  It is angiographically normal.   IMPRESSION/RECOMMENDATIONS:  Minimal atherosclerotic  coronary disease of the  ostium of the circumflex.  There is no cardiac explanation for her chest  discomforts.  Will discharge her home with continued proton pump inhibitor  and instructions to follow-up with her primary care physician.      Salvadore Farber, M.D. [facility]  Electronically Signed     WED/MEDQ  D:  11/04/2005  T:  11/05/2005  Job:  [id number]   cc:   Della Goo, M.D.  Fax: [phone]

## 2010-11-12 NOTE — Discharge Summary (Signed)
[name], [name]                           ACCOUNT NO.:  [id number]   MEDICAL RECORD NO.:  [id number]                   PATIENT TYPE:  INP   LOCATION:  3015                                 FACILITY:  [facility]   PHYSICIAN:  Deanna Artis. Sharene Skeans, M.D.           DATE OF BIRTH:  [birth year]   DATE OF ADMISSION:  11/21/2002  DATE OF DISCHARGE:                                 DISCHARGE SUMMARY   FINAL DIAGNOSES:  1. Idiopathic epilepsy 345.10.  2. Gastroesophageal reflux disease.  3. Type 2 noninsulin-dependent diabetes mellitus.  4. Hypertension without congestive heart failure.  5. Osteoarthritis.  6. Cutaneous right breast lesion, unknown etiology.   PROCEDURES:  1. CT scan of the brain.  2. Electroencephalogram.   COMPLICATIONS:  None.   HOSPITAL COURSE:  The patient was admitted after two generalized seizures at  home.  She was in a postictal state.  The patient has improved to normal at  this time.  She had one prior seizure in April 2003.   CT scan of the brain normal.  EEG normal.   LABORATORY DATA:  Sodium 138, potassium 4, chloride 103, CO2 24, BUN 12,  creatinine 0.7, glucose 134.  Calcium 9.5, total protein 7.2, albumin 3.4,  AST 27, ALT 21, bilirubin 0.4.  White count 13,300, hemoglobin 13.7,  hematocrit 39.3, MCV 87.9, platelet count 281,000, 89 polys, 7 lymphs, 3  monos.  Repeat CBC the next morning showed white count 9200, hemoglobin  11.8, hematocrit 34.1, MCV 88.9, platelet count 263,000.  This is thought to  represent hemodilution from IV fluids.  CK 311, CK-MB 4.9, Troponin 0.01.  Drug tox screen was normal.  Urinalysis:  Specific gravity 1.021, pH 0.5,  ketones 15, protein 30, 0-2 white blood cells and red blood cells.  Nitrite  and leukocyte esterase were negative.   The patient was treated with Dilantin and has had no further seizures.   PHYSICAL EXAMINATION:  VITAL SIGNS:  Blood pressure 141/49, resting pulse  74, respirations 20, temperature 97.7,  pulse oximetry 91%.  HEENT:  No signs of infection.  LUNGS:  Clear.  BREASTS:  There is a 1 x 1 cm raised, somewhat darkened, cutaneous lesion.  It is nontender and definitely within the cutaneous tissue.  The remainder  of the breast examination shows the patient has pendulous breasts.  She has  some small cysts over the breasts but no other lesions noted in the axillary  extension or around the nipple.  HEART:  No murmurs.  Pulses normal.  ABDOMEN:  Soft, bowel sounds normal.  No hepatosplenomegaly.  EXTREMITIES:  She has a painful right knee which is from an old accident.  NEUROLOGICAL:  Mental Status:  The patient was awake, alert, no dysphasia or  dyspraxia.  Cranial nerves were intact.  Motor examination:  Normal strength  without drift in the upper extremities.  Good fine motor movements.  In the  lower extremities, she had give away strength in the right leg.  However,  muscles related to the knee flexor, dorsiflexor and plantar flexor were  normal.  Left leg was 4+/5 proximally, 5/5 distally.  Sensory examination  shows a mild peripheral polyneuropathy.  Gait is slightly antalgic and broad  based but steady.  Deep tendon reflexes are symmetric but diminished.  The  patient had bilateral flexor plantar responses.   DISCHARGE MEDICATIONS:  1. The patient will be discharged on Dilantin 100 mg daily.  2. Protonix 40 mg daily.  3. Glucophage 500 mg b.i.d.  4. Aldactone 25 mg daily.  5. Potassium chloride 20 mEq daily.   FOLLOWUP:  The patient is to be seen at [facility] by  Demetrio Lapping, P.A.-C on a day when Dr. Thad Ranger is in the office.  This  should be in approximately four weeks.  At this time, because of the  tendency of Dilantin to exacerbate osteoporosis, I would recommend a slow  change-over to Lamictal.  The patient will need prescription drug coverage  assistance because she has no drug coverage at this time and pays for all of  her medicines  out of pocket.   I have suggested that she see her primary doctor, Dr. Tad Moore, to check her  breast lesion or her gynecologist.  I do not know the cause of this lesion  and do not believe that it is malignant.  However, I believe it needs  further evaluation.  It has been two years since the patient had a  mammogram.  She also needs a mammogram.  We will not delay her discharge for  that.  The patient has been sent out with a prescription for Dilantin 100 mg  tablets three at bedtime #90 and also for Vicodin 10 mg 1-2 q.6 h. as needed  for pain for her knee.  She is to receive any further pain prescription  refills from her primary physician, Dr. Tad Moore.                                               Deanna Artis. Sharene Skeans, M.D.    [facility]  D:  11/23/2002  T:  11/23/2002  Job:  [id number]   cc:   Casimiro Needle L. Thad Ranger, M.D.  [address]  [address]  [place]  [place] [address]  Fax: [phone]   Sharyn Dross., M.D.  [address]  [address]  [place]  [place] [address]  Fax: [phone]

## 2010-11-12 NOTE — Discharge Summary (Signed)
[name], [name]                 ACCOUNT NO.:  [id number]   MEDICAL RECORD NO.:  [id number]          PATIENT TYPE:  INP   LOCATION:  1416                         [facility]   PHYSICIAN:  Lonia Blood, M.D.      DATE OF BIRTH:  09/16/1940   DATE OF ADMISSION:  10/22/2006  DATE OF DISCHARGE:  10/24/2006                               DISCHARGE SUMMARY   PRIMARY CARE PHYSICIAN:  Dr. Della Goo.   DISCHARGE DIAGNOSES:  1. Atypical chest pain that has resolved.  2. Left-sided numbness with negative findings.  3. Controlled diabetes type 2.  4. Hypertension.  5. Morbid obesity.   DISCHARGE MEDICATIONS:  1. Glipizide 5 mg twice daily.  2. Ranitidine 300 mg daily.  3. Lyrica 100 mg daily.  4. Aspirin 81 mg daily.  5. Benicar HCT 20/12.5 mg one tablet daily.  6. Klor-Con one tablet daily.  7. Vitamin daily.   DISPOSITION:  The patient was discharged to follow up with Dr. Della Goo for further management.  Her pain has literally improved.   PROCEDURES PERFORMED:  Chest x-ray on April27 showed no acute  disease.  CT head without contrast on April27 also negative.  MRA of  the brain on April28 showed atrophy and small vessel disease  minimally progressive from 2004 but no acute stroke.  MRA was  essentially negative.   CONSULTATIONS:  None.   BRIEF HISTORY AND PHYSICAL:  Please refer to dictated History and  Physical by Dr. Della Goo.  In short, however, the patient is a  70 year old female with known history of hypertension and diabetes that  came in complaining of right arm numbness as well as chest pain.  She  was admitted for further management of chest pain and possible TIA.   HOSPITAL COURSE:  1. Chest pain.  The patient had serial cardiac enzymes checked.  No      EKG change.  Chest x-ray was also negative.  It was presumed that      this was noncardiac in nature.  Based on the results of her workup      she is to have an outpatient stress test  arranged by Dr. Della Goo.  2. Left-sided numbness.  Again this was presumed to be secondary to      her heart problem initially, but there was no problem discovered.      Also TIA was part of the differentials.  MRA was negative.  She was      placed on aspirin after follow-up with Dr. Lovell Sheehan.  Diabetes blood      sugar was essentially controlled using her home      medications.  3. Hypertension.  Again blood pressure was controlled also using home      medications so no specific change was done this hospitalization.      Lonia Blood, M.D.  Electronically Signed     LG/MEDQ  D:  11/06/2006  T:  11/06/2006  Job:  [id number]

## 2010-11-12 NOTE — Consult Note (Signed)
[name], [name]                 ACCOUNT NO.:  [id number]   MEDICAL RECORD NO.:  [id number]          Coffey TYPE:  INP   LOCATION:  3709                         FACILITY:  [facility]   PHYSICIAN:  Jonelle Sidle, M.D. LHCDATE OF BIRTH:  1940-07-31   DATE OF CONSULTATION:  DATE OF DISCHARGE:                                   CONSULTATION   REQUESTING PHYSICIAN:  Dr. Garnette Czech.   PRIMARY CARE PHYSICIAN:  Dr. Jeannetta Nap in [place], [place].   REASON FOR CONSULTATION:  Chest pain.   HISTORY OF PRESENT ILLNESS:  [name] is a pleasant 70 year old woman with a  15-year history of type 2 diabetes mellitus, hypertension, and dyslipidemia.  She is admitted to [facility]in hospital now with a two to three week history of  chest discomfort.  She describes initially feeling a sharp pain in Kristin  left side of her chest which was fairly intense initially, although since  that time has waxed and waned, although not entirely disappeared.  She has  also felt more short of breath since that time and has had episodes of arm  numbness.  Symptoms have progressed and precipitated evaluation in Kristin  hospital, although so far assessment has been fairly reassuring.  She had a  2-D echocardiogram  performed today which shows an ejection fraction of 55  to 60% with description of no regional wall motion abnormalities and no  significant valvular disease.  Her electrocardiogram initially did show  frequent premature ventricular complexes but no other ST changes.  Most  recent tracing shows nonspecific T-wave changes and resolution of premature  ventricular complexes.  Her initial cardiac markers have been normal.  She  has had some recurrent symptoms during observation, however.  She denies any  previous ischemic testing and remains very concerned about Kristin possibility  of underlying heart disease.   ALLERGIES:  PENICILLIN.   MEDICATIONS:  1.  Glucophage 500 mg p.o. daily.  2.  Keppra 250 mg p.o. daily.  3.   Aciphex 20 mg p.o. daily.  4.  Crestor 10 mg p.o. daily.  5.  Benicar/HCTZ 20/12.5 mg p.o. daily.  6.  Actos 15 mg p.o. daily.  7.  Klor-Con 20 mg p.o. daily.   PAST MEDICAL HISTORY:  1.  A 15-year history of type 2 diabetes mellitus.  2.  Hypertension.  3.  Dyslipidemia.  4.  Status post hysterectomy with preservation of Kristin ovaries.  5.  Osteoarthritis.  6.  Reported seizure disorder.  7.  Gastroesophageal reflux disease.   SOCIAL HISTORY:  [name] is retired from [employer].  She is also involved in  [place] in [place] and is very active in her church.  She is married and  has a son both of which are present today.  [place]  She drinks alcohol occasionally and has no significant recent  tobacco use history.  She does not exercise regularly.   FAMILY HISTORY:  Significant for myocardial infarction in [name]'s  father who died in his mid 52s.   REVIEW OF SYMPTOMS:  As described in history of present illness and outlined  in Kristin database completed by Kristin physician's assistant.   PHYSICAL EXAMINATION:  GENERAL APPEARANCE:  This is a well-nourished woman  in no acute distress.  VITAL SIGNS:  Temperature is 97 degrees, heart rate is 60, respirations 20,  blood pressure 98/60, weight 182.6 pounds, oxygenation 100% on two liters  nasal cannula.  NECK:  No elevated jugular venous pressure or loud carotid bruits.  No  thyromegaly is noted.  LUNGS:  Clear with nonlabored breathing and no rales on exam.  CARDIOVASCULAR:  Regular rate and rhythm without loud murmurs, rubs, or  gallops.  There is no pericardial rub.  ABDOMEN:  Soft and nontender with normoactive bowel sounds.  EXTREMITIES:  No pitting edema.  Peripheral pulses are 2+.  SKIN:  No ulcer changes noted.  MUSCULOSKELETAL:  No kyphosis noted.   Chest x-ray is reported as showing no active disease.   LABORATORY DATA:  Kristin wbc is 8.2, hemoglobin 12.9, hematocrit 37.7,  platelets 247.   Sodium 140, potassium 3.3 (being repleted), BUN 11,  creatinine 0.9, glucose 93.  Initial two troponin I levels are normal.  INR  is 0.9.   IMPRESSION:  1.  Two to three-week history of intermittent chest discomfort, shortness of      breath with fairly atypical features although in Kristin setting of      longstanding diabetes mellitus.  2.  Hypertension.  3.  Dyslipidemia.  4.  Gastroesophageal reflux disease.   RECOMMENDATIONS:  1.  I discussed Kristin situation with [name], her husband and her son      including Kristin risks and potential benefits of both noninvasive and      invasive cardiac testing.  After reviewing this and based on Kristin      Coffey's significant concern about her cardiac status, we have elected      to proceed with diagnostic coronary angiography to clearly      outline Kristin coronary anatomy.  This will be scheduled for Friday.  Will      maintain present regimen as outlined above including heparin and      Lopressor as well as nitroglycerin paste.  2.  Further plans following acquisition of above information.      SGM/MEDQ  D:  08/19/2004  T:  08/19/2004  Job:  [id number]   cc:   Dr. Garnette Czech   Dr. Joanne Gavel [place], [place]

## 2010-12-22 ENCOUNTER — Other Ambulatory Visit: Payer: Self-pay | Admitting: Internal Medicine

## 2011-01-24 ENCOUNTER — Other Ambulatory Visit: Payer: Self-pay | Admitting: Internal Medicine

## 2011-02-01 ENCOUNTER — Other Ambulatory Visit: Payer: Self-pay | Admitting: Internal Medicine

## 2011-02-07 ENCOUNTER — Ambulatory Visit (INDEPENDENT_AMBULATORY_CARE_PROVIDER_SITE_OTHER): Payer: Medicare Other | Admitting: Internal Medicine

## 2011-02-07 ENCOUNTER — Other Ambulatory Visit (INDEPENDENT_AMBULATORY_CARE_PROVIDER_SITE_OTHER): Payer: Medicare Other

## 2011-02-07 ENCOUNTER — Encounter: Payer: Self-pay | Admitting: Internal Medicine

## 2011-02-07 DIAGNOSIS — E119 Type 2 diabetes mellitus without complications: Secondary | ICD-10-CM

## 2011-02-07 DIAGNOSIS — R05 Cough: Secondary | ICD-10-CM

## 2011-02-07 DIAGNOSIS — I10 Essential (primary) hypertension: Secondary | ICD-10-CM

## 2011-02-07 DIAGNOSIS — R059 Cough, unspecified: Secondary | ICD-10-CM

## 2011-02-07 LAB — HEMOGLOBIN A1C: Hgb A1c MFr Bld: 6.8 % — ABNORMAL HIGH (ref 4.6–6.5)

## 2011-02-07 MED ORDER — BENZONATATE 100 MG PO CAPS: 100.0000 mg | ORAL_CAPSULE | Freq: Three times a day (TID) | ORAL | Status: DC | PRN

## 2011-02-07 NOTE — Patient Instructions (Signed)
It was good to see you today. Test(s) ordered today. Your results will be called to you after review (48-72hours after test completion). If any changes need to be made, you will be notified at that time. Medications reviewed, no changes at this time - but use tessalon as needed for cough symptoms, especially bedtime Your prescription(s) have been submitted to your pharmacy. Please take as directed and contact our office if you believe you are having problem(s) with the medication(s). Please schedule followup in 3-4 months, call sooner if problems.

## 2011-02-07 NOTE — Progress Notes (Signed)
Subjective:    Patient ID: [name], female    DOB: 1940-10-14, 70 y.o.   MRN: [id number]  HPI complains of ongoing cough -  Dry, associated with throat mucus but denies PND Occurs all day, not worse at night or after meals Not improved with mucinex Ongoing >6 mo   Also here for follow up - reviewed chronic medical issues today:  HTN - reports compliance with ongoing medical treatment and no changes in medication dose or frequency. prior dizziness after taking AM pills is improved as she now takes diovan before bed (pm)  DM2 - reports compliance with ongoing medical treatment and no changes in medication dose or frequency. checks sugars every morning  - if over 125, takes 1/2 tab glipizide; whole tab if >150 - this occurs 3-4 d/wk - no hypoglycemia symptoms   OA - continue pain in R knee and both shoulders - stopped ultracet on becuase not working- still taking tylenol arthritis with incomplete relief of symptoms-  s/p steroid shot due to flare fall 03/2010 - walking as weather and pain permits  IBS - chronic gas/abd bloating - improved with probiotics - food dairy reviewed - exac following hosp for diverticulitis 03/2010 but now back to baseline  Past Medical History  Diagnosis Date  . Diabetes mellitus   . GERD (gastroesophageal reflux disease)   . Depression   . Seizure disorder   . Dyslipidemia   . OSA on CPAP   . Osteoarthritis of shoulder region     and Knee  . Anxiety   . Diverticulosis of colon 03/2010 hosp  . Hypertension     Review of Systems  Constitutional: Negative for unexpected weight change.  Respiratory: Negative for shortness of breath.   Cardiovascular: Negative for chest pain.       Objective:   Physical Exam  Wt Readings from Last 3 Encounters:  02/07/11 171 lb 6.4 oz (77.747 kg)  11/08/10 172 lb (78.019 kg)  08/09/10 168 lb (76.204 kg)   BP 134/82  Pulse 78  Temp(Src) 98.6 F (37 C) (Oral)  Ht 5\' 3"  (1.6 m)  Wt 171 lb 6.4 oz (77.747  kg)  BMI 30.36 kg/m2  SpO2 98%  Constitutional: She is overweight; oriented to person, place, and time. She appears well-developed and well-nourished. No distress.  HENT: clear thick PND in OP, Ears with clear TMs B Eyes - PERRL, no conjunctivitis Neck: Normal range of motion. Neck supple. No JVD present. No thyromegaly present.  Cardiovascular: Normal rate, regular rhythm and normal heart sounds.  No murmur heard.  No BLE edema Pulmonary/Chest: Effort normal and breath sounds normal. No respiratory distress. She has no wheezes. Psychiatric: She has a normal mood and affect. Her behavior is normal. Judgment and thought content normal.   Lab Results  Component Value Date   WBC 7.1 11/08/2010   HGB 13.2 11/08/2010   HCT 37.9 11/08/2010   PLT 275.0 11/08/2010   CHOL 217* 03/18/2009   TRIG 104.0 03/18/2009   HDL 57.50 03/18/2009   LDLDIRECT 140.4 03/18/2009   ALT 20 11/08/2010   AST 24 11/08/2010   NA 133* 11/08/2010   K 3.8 11/08/2010   CL 96 11/08/2010   CREATININE 0.7 11/08/2010   BUN 10 11/08/2010   CO2 28 11/08/2010   TSH 1.70 11/08/2010   HGBA1C 6.8* 11/08/2010   MICROALBUR 1.0 10/07/2009   Lab Results  Component Value Date   VITAMINB12 509 11/08/2010       Assessment &  Plan:  See problem list. Medications and labs reviewed today.  Cough, dry - suspect PND - already on claritin and prn flonase - encouraged to use daily ; continue pepcid for GERD- add tessalon for irritation - CXR 02/2010 reviewed for same symptoms - NAD

## 2011-02-07 NOTE — Assessment & Plan Note (Signed)
Metformin + prn glipizide The current medical regimen is effective;  continue present plan and medications. Lab Results  Component Value Date   HGBA1C 6.8* 11/08/2010

## 2011-02-07 NOTE — Assessment & Plan Note (Signed)
BP Readings from Last 3 Encounters:  02/07/11 134/82  11/08/10 112/62  08/09/10 120/70   The current medical regimen is effective;  continue present plan and medications.

## 2011-02-22 ENCOUNTER — Other Ambulatory Visit: Payer: Self-pay | Admitting: Internal Medicine

## 2011-03-18 LAB — TROPONIN I: Troponin I: 0.02

## 2011-03-18 LAB — I-STAT 8, (EC8 V) (CONVERTED LAB)
Acid-base deficit: 1
Bicarbonate: 23
Glucose, Bld: 139 — ABNORMAL HIGH
Hemoglobin: 13.9
Operator id: 284141
Sodium: 136
TCO2: 24

## 2011-03-18 LAB — POCT CARDIAC MARKERS
Myoglobin, poc: 44
Myoglobin, poc: 45.3
Operator id: 284141
Operator id: 284141
Troponin i, poc: <0.05
Troponin i, poc: <0.05

## 2011-03-18 LAB — HEMOGLOBIN A1C
Hgb A1c MFr Bld: 7.7 — ABNORMAL HIGH
Mean Plasma Glucose: 197

## 2011-03-18 LAB — TSH: TSH: 3.323

## 2011-03-18 LAB — CK TOTAL AND CKMB (NOT AT ARMC): Total CK: 188 — ABNORMAL HIGH

## 2011-03-18 LAB — LIPID PANEL
Cholesterol: 236 — ABNORMAL HIGH
HDL: 53
Triglycerides: 263 — ABNORMAL HIGH

## 2011-03-21 ENCOUNTER — Other Ambulatory Visit: Payer: Self-pay | Admitting: Internal Medicine

## 2011-03-21 LAB — I-STAT 8, (EC8 V) (CONVERTED LAB)
BUN: 4 — ABNORMAL LOW
Chloride: 102
HCT: 40
Operator id: 294511
pCO2, Ven: 35.4 — ABNORMAL LOW

## 2011-03-21 LAB — URINALYSIS, ROUTINE W REFLEX MICROSCOPIC
Bilirubin Urine: NEGATIVE
Glucose, UA: NEGATIVE
Hgb urine dipstick: NEGATIVE
Ketones, ur: NEGATIVE
Protein, ur: NEGATIVE

## 2011-03-21 LAB — DIFFERENTIAL
Basophils Absolute: 0
Eosinophils Relative: 1
Lymphs Abs: 6.2 — ABNORMAL HIGH
Monocytes Absolute: 0.9

## 2011-03-21 LAB — CBC
HCT: 37.5
MCV: 91.4
Platelets: 281
RDW: 13

## 2011-03-28 LAB — CBC
Hemoglobin: 12
MCV: 91.5
MCV: 92
Platelets: 223
RBC: 3.41 — ABNORMAL LOW
RBC: 3.87
RBC: 4.19
WBC: 10.4
WBC: 6.1
WBC: 8.8

## 2011-03-28 LAB — URINALYSIS, ROUTINE W REFLEX MICROSCOPIC
Glucose, UA: NEGATIVE
Hgb urine dipstick: NEGATIVE
Ketones, ur: NEGATIVE
Protein, ur: NEGATIVE
pH: 5.5

## 2011-03-28 LAB — GLUCOSE, CAPILLARY
Glucose-Capillary: 112 — ABNORMAL HIGH
Glucose-Capillary: 112 — ABNORMAL HIGH
Glucose-Capillary: 125 — ABNORMAL HIGH
Glucose-Capillary: 133 — ABNORMAL HIGH
Glucose-Capillary: 144 — ABNORMAL HIGH
Glucose-Capillary: 159 — ABNORMAL HIGH
Glucose-Capillary: 165 — ABNORMAL HIGH
Glucose-Capillary: 178 — ABNORMAL HIGH
Glucose-Capillary: 183 — ABNORMAL HIGH
Glucose-Capillary: 195 — ABNORMAL HIGH
Glucose-Capillary: 207 — ABNORMAL HIGH
Glucose-Capillary: 218 — ABNORMAL HIGH

## 2011-03-28 LAB — BASIC METABOLIC PANEL
Calcium: 9.3
Chloride: 101
Creatinine, Ser: 0.55
GFR calc Af Amer: >60
GFR calc Af Amer: >60
GFR calc non Af Amer: >60
GFR calc non Af Amer: >60
Sodium: 136

## 2011-03-28 LAB — DIFFERENTIAL
Basophils Absolute: 0
Eosinophils Absolute: 0
Eosinophils Relative: 1

## 2011-03-28 LAB — COMPREHENSIVE METABOLIC PANEL
ALT: 17
AST: 23
Alkaline Phosphatase: 72
CO2: 31
Chloride: 101
GFR calc Af Amer: >60
GFR calc non Af Amer: >60
Potassium: 3.7
Sodium: 140
Total Bilirubin: 0.5

## 2011-03-28 LAB — 10-HYDROXYCARBAZEPINE: Triliptal/MTB(Oxcarbazepin): 6 — ABNORMAL LOW (ref 10–35)

## 2011-03-28 LAB — ABO/RH: ABO/RH(D): O POS

## 2011-03-28 LAB — PROTIME-INR
INR: 1.1
INR: 1.6 — ABNORMAL HIGH
INR: 1.6 — ABNORMAL HIGH
INR: 1.8 — ABNORMAL HIGH
Prothrombin Time: 13.1
Prothrombin Time: 19.9 — ABNORMAL HIGH
Prothrombin Time: 21.7 — ABNORMAL HIGH

## 2011-03-28 LAB — TYPE AND SCREEN: ABO/RH(D): O POS

## 2011-04-13 ENCOUNTER — Other Ambulatory Visit: Payer: Self-pay | Admitting: Internal Medicine

## 2011-05-12 ENCOUNTER — Other Ambulatory Visit (INDEPENDENT_AMBULATORY_CARE_PROVIDER_SITE_OTHER): Payer: Medicare Other

## 2011-05-12 ENCOUNTER — Ambulatory Visit (INDEPENDENT_AMBULATORY_CARE_PROVIDER_SITE_OTHER): Payer: Medicare Other | Admitting: Internal Medicine

## 2011-05-12 ENCOUNTER — Encounter: Payer: Self-pay | Admitting: Internal Medicine

## 2011-05-12 DIAGNOSIS — E119 Type 2 diabetes mellitus without complications: Secondary | ICD-10-CM

## 2011-05-12 DIAGNOSIS — I10 Essential (primary) hypertension: Secondary | ICD-10-CM

## 2011-05-12 DIAGNOSIS — K589 Irritable bowel syndrome without diarrhea: Secondary | ICD-10-CM

## 2011-05-12 LAB — HEMOGLOBIN A1C: Hgb A1c MFr Bld: 6.5 % (ref 4.6–6.5)

## 2011-05-12 MED ORDER — LUBIPROSTONE 8 MCG PO CAPS: 8.0000 ug | ORAL_CAPSULE | Freq: Two times a day (BID) | ORAL | Status: AC

## 2011-05-12 NOTE — Assessment & Plan Note (Signed)
Metformin + prn glipizide The current medical regimen is effective;  continue present plan and medications. Lab Results  Component Value Date   HGBA1C 6.8* 02/07/2011

## 2011-05-12 NOTE — Progress Notes (Signed)
  Subjective:    Patient ID: [name], female    DOB: [birth year], 70 y.o.   MRN: [id number]  HPI  here for follow up - reviewed chronic medical issues today:  HTN - reports compliance with ongoing medical treatment and no changes in medication dose or frequency. prior dizziness after taking AM pills is improved as she now takes diovan before bed (pm)  DM2 - reports compliance with ongoing medical treatment and no changes in medication dose or frequency. checks sugars every morning  - if over 125, takes 1/2 tab glipizide; whole tab if >150 - this occurs 3-4 d/wk - no hypoglycemia symptoms   OA - continue pain in R knee and both shoulders - stopped ultracet on becuase not working- still taking tylenol arthritis with incomplete relief of symptoms-  s/p steroid shot due to flare fall 03/2010 - walking as weather and pain permits  IBS - chronic gas/abd bloating - improved with probiotics - food dairy reviewed - exac following hosp for diverticulitis 03/2010 but now back to baseline  Past Medical History  Diagnosis Date  . Diabetes mellitus   . GERD (gastroesophageal reflux disease)   . Depression   . Seizure disorder   . Dyslipidemia   . OSA on CPAP   . Osteoarthritis of shoulder region     and Knee  . Anxiety   . Diverticulosis of colon 03/2010 hosp  . Hypertension     Review of Systems  Constitutional: Negative for unexpected weight change.  Respiratory: Negative for shortness of breath.   Cardiovascular: Negative for chest pain.       Objective:   Physical Exam  Wt Readings from Last 3 Encounters:  05/12/11 171 lb 1.9 oz (77.62 kg)  02/07/11 171 lb 6.4 oz (77.747 kg)  11/08/10 172 lb (78.019 kg)   BP 102/62  Pulse 78  Temp(Src) 98.2 F (36.8 C) (Oral)  Ht 5\' 5"  (1.651 m)  Wt 171 lb 1.9 oz (77.62 kg)  BMI 28.48 kg/m2  SpO2 98%  Constitutional: She is overweight; but appears well-developed and well-nourished. No distress.  Neck: Normal range of motion. Neck  supple. No JVD present. No thyromegaly present.  Cardiovascular: Normal rate, regular rhythm and normal heart sounds.  No murmur heard.  No BLE edema Pulmonary/Chest: Effort normal and breath sounds normal. No respiratory distress. She has no wheezes. Abd: soft, NT, ND +bs, no rebound Psychiatric: She has a normal mood and affect. Her behavior is normal. Judgment and thought content normal.   Lab Results  Component Value Date   WBC 7.1 11/08/2010   HGB 13.2 11/08/2010   HCT 37.9 11/08/2010   PLT 275.0 11/08/2010   CHOL 217* 03/18/2009   TRIG 104.0 03/18/2009   HDL 57.50 03/18/2009   LDLDIRECT 140.4 03/18/2009   ALT 20 11/08/2010   AST 24 11/08/2010   NA 133* 11/08/2010   K 3.8 11/08/2010   CL 96 11/08/2010   CREATININE 0.7 11/08/2010   BUN 10 11/08/2010   CO2 28 11/08/2010   TSH 1.70 11/08/2010   INR 1.8* 04/08/2008   HGBA1C 6.8* 02/07/2011   MICROALBUR 1.0 10/07/2009   Lab Results  Component Value Date   VITAMINB12 509 11/08/2010       Assessment & Plan:  See problem list. Medications and labs reviewed today.

## 2011-05-12 NOTE — Assessment & Plan Note (Signed)
Constipation prone - on Miralax, stool softner  Also chronic bloat and gas Try amitiza

## 2011-05-12 NOTE — Patient Instructions (Signed)
It was good to see you today. Test(s) ordered today. Your results will be called to you after review (48-72hours after test completion). If any changes need to be made, you will be notified at that time. Medications reviewed, no changes at this time - but try Amigesic twice a day for bloating and bowel symptoms. Call if prescription desired, sample given today Also continue probiotic, Gas-X/simethicone, Beano and MiraLAX - Also try acetaminophen twice daily for arthritis Please schedule followup in 3-4 months, call sooner if problems.

## 2011-05-12 NOTE — Assessment & Plan Note (Signed)
BP Readings from Last 3 Encounters:  05/12/11 102/62  02/07/11 134/82  11/08/10 112/62   The current medical regimen is effective;  continue present plan and medications.

## 2011-05-26 ENCOUNTER — Other Ambulatory Visit: Payer: Self-pay | Admitting: Internal Medicine

## 2011-06-07 ENCOUNTER — Other Ambulatory Visit: Payer: Self-pay | Admitting: Internal Medicine

## 2011-07-04 ENCOUNTER — Other Ambulatory Visit: Payer: Self-pay | Admitting: Internal Medicine

## 2011-07-04 MED ORDER — BENZONATATE 100 MG PO CAPS: 100.0000 mg | ORAL_CAPSULE | Freq: Three times a day (TID) | ORAL | Status: DC | PRN

## 2011-07-04 NOTE — Telephone Encounter (Signed)
Called pt to verify sxs. Pt states she just started coughing up phlegm with little color. Denies SOB or fever. No other sxs going on. Did inform pt md was out of office this am will be this afternoon for we get response...07/04/11@10 :50am/LMB

## 2011-07-04 NOTE — Telephone Encounter (Signed)
Tessalon tid prn - erx done 

## 2011-07-04 NOTE — Telephone Encounter (Signed)
Notified pt with md recommendations...07/04/11@2 :27pm/LMB

## 2011-07-04 NOTE — Telephone Encounter (Signed)
The pt called and is requesting a cough med be called in to the pharmacy.  She is not wanting an appt, but is requesting a med called in.    Thanks!

## 2011-08-10 ENCOUNTER — Ambulatory Visit: Payer: Medicare Other | Admitting: Internal Medicine

## 2011-08-11 ENCOUNTER — Encounter: Payer: Self-pay | Admitting: Internal Medicine

## 2011-08-11 ENCOUNTER — Ambulatory Visit (INDEPENDENT_AMBULATORY_CARE_PROVIDER_SITE_OTHER): Payer: Medicare Other | Admitting: Internal Medicine

## 2011-08-11 VITALS — BP 110/62 | HR 78 | Temp 98.3°F | Wt 169.4 lb

## 2011-08-11 DIAGNOSIS — G4700 Insomnia, unspecified: Secondary | ICD-10-CM

## 2011-08-11 DIAGNOSIS — F4321 Adjustment disorder with depressed mood: Secondary | ICD-10-CM

## 2011-08-11 DIAGNOSIS — J3089 Other allergic rhinitis: Secondary | ICD-10-CM

## 2011-08-11 DIAGNOSIS — G44209 Tension-type headache, unspecified, not intractable: Secondary | ICD-10-CM

## 2011-08-11 MED ORDER — ALPRAZOLAM 0.25 MG PO TABS: 0.2500 mg | ORAL_TABLET | Freq: Two times a day (BID) | ORAL | Status: DC | PRN

## 2011-08-11 MED ORDER — CYCLOBENZAPRINE HCL 5 MG PO TABS: 5.0000 mg | ORAL_TABLET | Freq: Three times a day (TID) | ORAL | Status: DC | PRN

## 2011-08-11 MED ORDER — AZELASTINE HCL 0.1 % NA SOLN: 2.0000 | Freq: Two times a day (BID) | NASAL | Status: DC

## 2011-08-11 MED ORDER — PROMETHAZINE-CODEINE 6.25-10 MG/5ML PO SYRP: 5.0000 mL | ORAL_SOLUTION | ORAL | Status: DC | PRN

## 2011-08-11 NOTE — Patient Instructions (Signed)
It was good to see you today. I am sorry for your family's loss Use Flexeril for muscle tension and headache, use codeine cough syrup as needed, begin Astelin nose spray in addition to Flonase for sinus and allergy symptoms, also Xanax as needed for stress/sleep Your prescription(s) have been submitted to your pharmacy. Please take as directed and contact our office if you believe you are having problem(s) with the medication(s).

## 2011-08-11 NOTE — Progress Notes (Signed)
Subjective:    Patient ID: [name], female    DOB: 28-Aug-1940, 71 y.o.   MRN: [id number]  HPI  here for insomnia, exacerbated by stress related to father's illness and death. Funeral yesterday Associated with headache - posterior head. No vision change, weakness or history of migraines. Exacerbated by stress. Minimally relieved with over-the-counter Tylenol. Denies head trauma or falls. Complains of nasal congestion and sneezing, no fever, sinus pressure, intermittent relief with over-the-counter antihistamines and ongoing Flonase  Also reviewed chronic medical issues today:  HTN - reports compliance with ongoing medical treatment and no changes in medication dose or frequency. prior dizziness with AM dosing has resolved changing to diovan each bedtime  DM2 - reports compliance with ongoing medical treatment and no changes in medication dose or frequency. checks sugars every morning  - if over 125, takes 1/2 tab glipizide; whole tab if >150 - this occurs 3-4 d/wk - no hypoglycemia symptoms   OA - chronic pain in R knee and both shoulders - stopped ultracet (ineffective)- takes tylenol arthritis with incomplete relief of symptoms-  s/p steroid shot due to flare fall 03/2010 - walking as weather and pain permits  IBS - chronic gas/abd bloating - improved with probiotics - food dairy reviewed - exac following hosp for diverticulitis 03/2010 but now back to baseline  Past Medical History  Diagnosis Date  . Diabetes mellitus   . GERD (gastroesophageal reflux disease)   . Depression   . Seizure disorder   . Dyslipidemia   . OSA on CPAP   . Osteoarthritis of shoulder region     and Knee  . Anxiety   . Diverticulosis of colon 03/2010 hosp  . Hypertension     Review of Systems  Constitutional: Negative for fever and unexpected weight change.  Respiratory: Negative for shortness of breath.   Cardiovascular: Negative for chest pain and palpitations.  Neurological: Negative for  dizziness, seizures, facial asymmetry and numbness.  Psychiatric/Behavioral: Positive for dysphoric mood and decreased concentration. Negative for hallucinations.       Objective:   Physical Exam BP 110/62  Pulse 78  Temp(Src) 98.3 F (36.8 C) (Oral)  Wt 169 lb 6.4 oz (76.839 kg)  SpO2 99% Wt Readings from Last 3 Encounters:  08/11/11 169 lb 6.4 oz (76.839 kg)  05/12/11 171 lb 1.9 oz (77.62 kg)  02/07/11 171 lb 6.4 oz (77.747 kg)   Constitutional: She is overweight; but appears well-developed and well-nourished. No distress.  HEENT: Nontender palpation of her temporal region Neck: Normal range of motion. Neck supple with paraspinal spasm present bilateral neck and trapezius region. No JVD or LAD present. No thyromegaly present.  Cardiovascular: Normal rate, regular rhythm and normal heart sounds.  No murmur heard.  No BLE edema Pulmonary/Chest: Effort normal and breath sounds normal. No respiratory distress. She has no wheezes. Neurologic: Cranial nerves II through XII symmetrically intact, moves all extremities well. No dysarthria or aphasia. Coordination, balance, and gait normal -  Psychiatric: She has an appropriately sad dysphoric mood/affect. Her behavior is normal. Judgment and thought content normal.   Lab Results  Component Value Date   WBC 7.1 11/08/2010   HGB 13.2 11/08/2010   HCT 37.9 11/08/2010   PLT 275.0 11/08/2010   CHOL 217* 03/18/2009   TRIG 104.0 03/18/2009   HDL 57.50 03/18/2009   LDLDIRECT 140.4 03/18/2009   ALT 20 11/08/2010   AST 24 11/08/2010   NA 133* 11/08/2010   K 3.8 11/08/2010   CL 96  11/08/2010   CREATININE 0.7 11/08/2010   BUN 10 11/08/2010   CO2 28 11/08/2010   TSH 1.70 11/08/2010   INR 1.8* 04/08/2008   HGBA1C 6.5 05/12/2011   MICROALBUR 1.0 10/07/2009   Lab Results  Component Value Date   VITAMINB12 509 11/08/2010       Assessment & Plan:   Tension headache. Neuro exam benign - no red flags on history. Will treat with muscle relaxants and when  necessary Xanax for stress/anxiety. See next. Okay to continue Tylenol as needed  Brief reaction/anxiety/insomnia. Precipitated by illness and death of brother, funeral yesterday. Supply of Xanax when necessary - support offered  allergic rhinitis associated with postnasal drip and cough. Normal O2 and lungs clear. Use promethazine with codeine when necessary and add Astelin to ongoing Flonase for treatment of allergic rhinitis symptoms and postnasal drip

## 2011-11-03 ENCOUNTER — Ambulatory Visit (INDEPENDENT_AMBULATORY_CARE_PROVIDER_SITE_OTHER): Payer: Medicare Other | Admitting: Internal Medicine

## 2011-11-03 ENCOUNTER — Other Ambulatory Visit (INDEPENDENT_AMBULATORY_CARE_PROVIDER_SITE_OTHER): Payer: Medicare Other

## 2011-11-03 ENCOUNTER — Encounter: Payer: Self-pay | Admitting: Internal Medicine

## 2011-11-03 VITALS — BP 122/80 | HR 78 | Temp 97.1°F | Ht 63.0 in | Wt 173.4 lb

## 2011-11-03 DIAGNOSIS — K219 Gastro-esophageal reflux disease without esophagitis: Secondary | ICD-10-CM

## 2011-11-03 DIAGNOSIS — M1990 Unspecified osteoarthritis, unspecified site: Secondary | ICD-10-CM

## 2011-11-03 DIAGNOSIS — E119 Type 2 diabetes mellitus without complications: Secondary | ICD-10-CM

## 2011-11-03 DIAGNOSIS — I10 Essential (primary) hypertension: Secondary | ICD-10-CM

## 2011-11-03 LAB — LIPID PANEL: Total CHOL/HDL Ratio: 3

## 2011-11-03 LAB — LDL CHOLESTEROL, DIRECT: Direct LDL: 132.7 mg/dL

## 2011-11-03 MED ORDER — HYDROCODONE-ACETAMINOPHEN 5-500 MG PO TABS: 0.5000 | ORAL_TABLET | Freq: Three times a day (TID) | ORAL | Status: AC | PRN

## 2011-11-03 MED ORDER — FAMOTIDINE 20 MG PO TABS: 20.0000 mg | ORAL_TABLET | Freq: Two times a day (BID) | ORAL | Status: DC

## 2011-11-03 NOTE — Patient Instructions (Signed)
It was good to see you today. Test(s) ordered today. Your results will be called to you after review (48-72hours after test completion). If any changes need to be made, you will be notified at that time. Medications reviewed, take famotidine twice a day for reflux symptoms. Also hydrocodone as needed for arthritis pain unrelieved with plain tylenol Your prescription(s) have been submitted to your pharmacy. Please take as directed and contact our office if you believe you are having problem(s) with the medication(s). Also continue probiotic, Gas-X/simethicone, Beano and MiraLAX - Please schedule followup in 6 months, call sooner if problems.

## 2011-11-03 NOTE — Assessment & Plan Note (Signed)
Metformin + prn glipizide The current medical regimen is effective;  continue present plan and medications. Lab Results  Component Value Date   HGBA1C 6.5 05/12/2011

## 2011-11-03 NOTE — Progress Notes (Signed)
Subjective:    Patient ID: [name], female    DOB: Feb 21, 1941, 71 y.o.   MRN: [id number]  HPI  here for follow up - reviewed chronic medical issues today:  hypertension - reports compliance with ongoing medical treatment and no changes in medication dose or frequency. prior dizziness with AM dosing has resolved changing to diovan each bedtime  DM2 - reports compliance with ongoing medical treatment and no changes in medication dose or frequency. checks sugars every morning  - if over 125, takes 1/2 tab glipizide; whole tab if >150 - this occurs 3-4 d/wk - no hypoglycemia symptoms   osteoarthritis - chronic pain in R knee and both shoulders - stopped ultracet (ineffective)- takes tylenol arthritis with incomplete relief of symptoms-  s/p steroid shot due to flare fall 03/2010 - walking as weather and pain permits  IBS/GERD - chronic gas/abd bloating - improved with probiotics - food dairy reviewed - exac following hosp for diverticulitis 03/2010 but now back to baseline - ?resume H2B  Past Medical History  Diagnosis Date  . Diabetes mellitus, type 2   . GERD (gastroesophageal reflux disease)   . Depression   . Seizure disorder   . Dyslipidemia   . OSA on CPAP   . Osteoarthritis of shoulder region     and Knee  . Anxiety   . Diverticulosis of colon 03/2010 hosp  . Hypertension     Review of Systems  Constitutional: Negative for fever and unexpected weight change.  Respiratory: Negative for shortness of breath.   Cardiovascular: Negative for chest pain and palpitations.  Neurological: Negative for dizziness, seizures, facial asymmetry and numbness.  Psychiatric/Behavioral: Negative for hallucinations.       Objective:   Physical Exam BP 122/80  Pulse 78  Temp(Src) 97.1 F (36.2 C) (Oral)  Ht 5\' 3"  (1.6 m)  Wt 173 lb 6.4 oz (78.654 kg)  BMI 30.72 kg/m2  SpO2 99% Wt Readings from Last 3 Encounters:  11/03/11 173 lb 6.4 oz (78.654 kg)  08/11/11 169 lb 6.4 oz  (76.839 kg)  05/12/11 171 lb 1.9 oz (77.62 kg)   Constitutional: She is overweight; but appears well-developed and well-nourished. No distress.  Cardiovascular: Normal rate, regular rhythm and normal heart sounds.  No murmur heard.  No BLE edema Pulmonary/Chest: Effort normal and breath sounds normal. No respiratory distress. She has no wheezes. MSkel: L>R Shoulder: No obvious deformities. Decreased range of motion overhead. Good strength with stressing of rotator cuff but with pain. Limited internal rotation and abduction. Positive impingement signs. Pain elicited with overhead activities. Neuro vascularly intact distally. Neurologic: Cranial nerves II through XII symmetrically intact, moves all extremities well. No dysarthria or aphasia. Coordination, balance, and gait normal -    Lab Results  Component Value Date   WBC 7.1 11/08/2010   HGB 13.2 11/08/2010   HCT 37.9 11/08/2010   PLT 275.0 11/08/2010   CHOL 217* 03/18/2009   TRIG 104.0 03/18/2009   HDL 57.50 03/18/2009   LDLDIRECT 140.4 03/18/2009   ALT 20 11/08/2010   AST 24 11/08/2010   NA 133* 11/08/2010   K 3.8 11/08/2010   CL 96 11/08/2010   CREATININE 0.7 11/08/2010   BUN 10 11/08/2010   CO2 28 11/08/2010   TSH 1.70 11/08/2010   INR 1.8* 04/08/2008   HGBA1C 6.5 05/12/2011   MICROALBUR 1.0 10/07/2009   Lab Results  Component Value Date   VITAMINB12 509 11/08/2010       Assessment & Plan:  See problem list. Medications and labs reviewed today.

## 2011-11-03 NOTE — Assessment & Plan Note (Signed)
Resume daily H2B - refill provided Continue probiotic for gas/bloating

## 2011-11-03 NOTE — Assessment & Plan Note (Signed)
Add Vicodin prn for pain unrelieved with tylenol Prior tramadol ineffective and GI issues make NSAIDs unattractive option

## 2011-11-03 NOTE — Assessment & Plan Note (Signed)
BP Readings from Last 3 Encounters:  11/03/11 122/80  08/11/11 110/62  05/12/11 102/62   The current medical regimen is effective;  continue present plan and medications.

## 2011-11-04 NOTE — Progress Notes (Signed)
Pt informed

## 2011-11-23 ENCOUNTER — Other Ambulatory Visit: Payer: Self-pay | Admitting: Internal Medicine

## 2011-12-19 ENCOUNTER — Other Ambulatory Visit: Payer: Self-pay | Admitting: Internal Medicine

## 2012-01-01 ENCOUNTER — Other Ambulatory Visit: Payer: Self-pay | Admitting: Internal Medicine

## 2012-01-03 ENCOUNTER — Other Ambulatory Visit: Payer: Self-pay | Admitting: Internal Medicine

## 2012-01-10 ENCOUNTER — Other Ambulatory Visit: Payer: Self-pay | Admitting: Internal Medicine

## 2012-01-18 ENCOUNTER — Other Ambulatory Visit: Payer: Self-pay | Admitting: Internal Medicine

## 2012-01-18 ENCOUNTER — Other Ambulatory Visit: Payer: Self-pay | Admitting: *Deleted

## 2012-01-18 MED ORDER — PROMETHAZINE-CODEINE 6.25-10 MG/5ML PO SYRP: 5.0000 mL | ORAL_SOLUTION | ORAL | Status: DC | PRN

## 2012-01-18 NOTE — Telephone Encounter (Signed)
Faxed script back to [facility]... 01/18/12@11 :22am/LMB

## 2012-02-05 ENCOUNTER — Other Ambulatory Visit: Payer: Self-pay | Admitting: Internal Medicine

## 2012-02-08 ENCOUNTER — Encounter: Payer: Self-pay | Admitting: Internal Medicine

## 2012-02-08 ENCOUNTER — Other Ambulatory Visit (INDEPENDENT_AMBULATORY_CARE_PROVIDER_SITE_OTHER): Payer: Medicare Other

## 2012-02-08 ENCOUNTER — Ambulatory Visit (INDEPENDENT_AMBULATORY_CARE_PROVIDER_SITE_OTHER): Payer: Medicare Other | Admitting: Internal Medicine

## 2012-02-08 VITALS — BP 118/62 | HR 76 | Temp 98.2°F | Ht 65.0 in | Wt 171.8 lb

## 2012-02-08 DIAGNOSIS — G629 Polyneuropathy, unspecified: Secondary | ICD-10-CM

## 2012-02-08 DIAGNOSIS — R209 Unspecified disturbances of skin sensation: Secondary | ICD-10-CM

## 2012-02-08 DIAGNOSIS — R2689 Other abnormalities of gait and mobility: Secondary | ICD-10-CM

## 2012-02-08 DIAGNOSIS — E119 Type 2 diabetes mellitus without complications: Secondary | ICD-10-CM

## 2012-02-08 DIAGNOSIS — R200 Anesthesia of skin: Secondary | ICD-10-CM

## 2012-02-08 DIAGNOSIS — G609 Hereditary and idiopathic neuropathy, unspecified: Secondary | ICD-10-CM

## 2012-02-08 DIAGNOSIS — R29818 Other symptoms and signs involving the nervous system: Secondary | ICD-10-CM

## 2012-02-08 DIAGNOSIS — E871 Hypo-osmolality and hyponatremia: Secondary | ICD-10-CM

## 2012-02-08 LAB — BASIC METABOLIC PANEL
BUN: 7 mg/dL (ref 6–23)
CO2: 30 mEq/L (ref 19–32)
Chloride: 92 mEq/L — ABNORMAL LOW (ref 96–112)
Glucose, Bld: 127 mg/dL — ABNORMAL HIGH (ref 70–99)
Potassium: 3.2 mEq/L — ABNORMAL LOW (ref 3.5–5.1)

## 2012-02-08 LAB — CBC WITH DIFFERENTIAL/PLATELET
Basophils Relative: 0.9 % (ref 0.0–3.0)
Eosinophils Absolute: 0.1 10*3/uL (ref 0.0–0.7)
Eosinophils Relative: 1.1 % (ref 0.0–5.0)
Hemoglobin: 12.3 g/dL (ref 12.0–15.0)
Lymphocytes Relative: 38.7 % (ref 12.0–46.0)
Monocytes Relative: 8.2 % (ref 3.0–12.0)
Neutrophils Relative %: 51.1 % (ref 43.0–77.0)
RBC: 4.06 Mil/uL (ref 3.87–5.11)
WBC: 5.4 10*3/uL (ref 4.5–10.5)

## 2012-02-08 LAB — HEPATIC FUNCTION PANEL
ALT: 13 U/L (ref 0–35)
AST: 23 U/L (ref 0–37)
Total Bilirubin: 0.2 mg/dL — ABNORMAL LOW (ref 0.3–1.2)
Total Protein: 7.4 g/dL (ref 6.0–8.3)

## 2012-02-08 LAB — TSH: TSH: 1.6 u[IU]/mL (ref 0.35–5.50)

## 2012-02-08 MED ORDER — GABAPENTIN 300 MG PO CAPS: 300.0000 mg | ORAL_CAPSULE | Freq: Three times a day (TID) | ORAL | Status: DC

## 2012-02-08 NOTE — Progress Notes (Signed)
Subjective:    Patient ID: [name], female    DOB: March 03, 1941, 71 y.o.   MRN: [id number]  HPI  here for several concerns -  1) balance problems with 2 falls over past 3 months - no syncope but mild positional dizziness  2) numbness and tingling B feet and hands - "pins and needles", associated with weakness sensation  also reviewed chronic medical issues today:  hypertension - reports compliance with ongoing medical treatment and no changes in medication dose or frequency. prior dizziness with AM dosing has resolved changing to diovan each bedtime  DM2 - reports compliance with ongoing medical treatment and no changes in medication dose or frequency. checks sugars every morning  - if over 125, takes 1/2 tab glipizide; whole tab if >150 - this occurs 3-4 d/wk - no hypoglycemia symptoms   osteoarthritis - chronic pain in R knee and both shoulders - stopped ultracet (ineffective)- takes tylenol arthritis with incomplete relief of symptoms-  s/p steroid shot due to flare fall 03/2010 - walking as weather and pain permits  IBS/GERD - chronic gas/abd bloating - improved with probiotics - food dairy reviewed - exac following hosp for diverticulitis 03/2010 but now back to baseline - ?resume H2B  Past Medical History  Diagnosis Date  . Diabetes mellitus, type 2   . GERD (gastroesophageal reflux disease)   . Depression   . Seizure disorder   . Dyslipidemia   . OSA on CPAP   . Osteoarthritis of shoulder region     and Knee  . Anxiety   . Diverticulosis of colon 03/2010 hosp  . Hypertension     Review of Systems  Constitutional: Negative for fever and unexpected weight change.  Respiratory: Negative for shortness of breath.   Cardiovascular: Negative for chest pain and palpitations.  Neurological: Negative for seizures and facial asymmetry.  Psychiatric/Behavioral: Negative for hallucinations.       Objective:   Physical Exam BP 118/62  Pulse 76  Temp 98.2 F (36.8 C)  (Oral)  Ht 5\' 5"  (1.651 m)  Wt 171 lb 12.8 oz (77.928 kg)  BMI 28.59 kg/m2  SpO2 97% Wt Readings from Last 3 Encounters:  02/08/12 171 lb 12.8 oz (77.928 kg)  11/03/11 173 lb 6.4 oz (78.654 kg)  08/11/11 169 lb 6.4 oz (76.839 kg)   Constitutional: She is overweight; but appears well-developed and well-nourished. No distress.  Cardiovascular: Normal rate, regular rhythm and normal heart sounds.  No murmur heard.  No BLE edema Pulmonary/Chest: Effort normal and breath sounds normal. No respiratory distress. She has no wheezes. Abdomen: SNTND, +BS, no R/G Neurologic: Cranial nerves II through XII symmetrically intact, moves all extremities well. No dysarthria or aphasia. Coordination normal - positive Romberg, and gait cautious unaides - no high step gait but B hands (R>L) with decreased grip consistent with mild myelopathy    Lab Results  Component Value Date   WBC 7.1 11/08/2010   HGB 13.2 11/08/2010   HCT 37.9 11/08/2010   PLT 275.0 11/08/2010   CHOL 213* 11/03/2011   TRIG 155.0* 11/03/2011   HDL 65.80 11/03/2011   LDLDIRECT 132.7 11/03/2011   ALT 20 11/08/2010   AST 24 11/08/2010   NA 133* 11/08/2010   K 3.8 11/08/2010   CL 96 11/08/2010   CREATININE 0.7 11/08/2010   BUN 10 11/08/2010   CO2 28 11/08/2010   TSH 1.70 11/08/2010   INR 1.8* 04/08/2008   HGBA1C 6.3 11/03/2011   MICROALBUR 1.0 10/07/2009  Lab Results  Component Value Date   VITAMINB12 509 11/08/2010       Assessment & Plan:  Balance problems with gait disorder and falls -  B feet>hands numbness and pain - ?peripheral neuropathy   check labs for metabolic neuropathy, PNCS/EMG and MRI c-spine rule out spinal stenosis Start gabapentin awaiting workup

## 2012-02-08 NOTE — Assessment & Plan Note (Signed)
Metformin + prn glipizide ?assocaited peripheral neuropathy - see above - labs and tests, start gabapentin  Lab Results  Component Value Date   HGBA1C 6.3 11/03/2011

## 2012-02-08 NOTE — Patient Instructions (Signed)
It was good to see you today. Test(s) ordered today. Your results will be called to you after review (48-72hours after test completion). If any changes need to be made, you will be notified at that time. we'll make referral for nerve conduction study and MRI neck to look for nerve problems causing numbness and balance problems. Our office will contact you regarding appointment(s) once made. Start gabapentin for nerve pain - one at bedtime for 2 weeks, then increase as directed

## 2012-02-09 ENCOUNTER — Other Ambulatory Visit: Payer: Self-pay | Admitting: *Deleted

## 2012-02-09 DIAGNOSIS — E871 Hypo-osmolality and hyponatremia: Secondary | ICD-10-CM

## 2012-02-10 NOTE — Addendum Note (Signed)
Addended by: Rene Paci A on: 02/10/2012 02:15 PM   Modules accepted: Orders

## 2012-02-13 ENCOUNTER — Other Ambulatory Visit (INDEPENDENT_AMBULATORY_CARE_PROVIDER_SITE_OTHER): Payer: Medicare Other

## 2012-02-13 DIAGNOSIS — E871 Hypo-osmolality and hyponatremia: Secondary | ICD-10-CM

## 2012-02-13 LAB — BASIC METABOLIC PANEL
BUN: 8 mg/dL (ref 6–23)
CO2: 28 mEq/L (ref 19–32)
Calcium: 9.8 mg/dL (ref 8.4–10.5)
GFR: 132.5 mL/min (ref >60.00)
Glucose, Bld: 92 mg/dL (ref 70–99)
Potassium: 4.1 mEq/L (ref 3.5–5.1)

## 2012-02-15 ENCOUNTER — Ambulatory Visit
Admission: RE | Admit: 2012-02-15 | Discharge: 2012-02-15 | Disposition: A | Discharge status: Home / Self Care | Payer: Medicare Other | Source: Ambulatory Visit | Attending: Internal Medicine | Admitting: Internal Medicine

## 2012-02-15 DIAGNOSIS — E119 Type 2 diabetes mellitus without complications: Secondary | ICD-10-CM

## 2012-02-15 DIAGNOSIS — G629 Polyneuropathy, unspecified: Secondary | ICD-10-CM

## 2012-02-15 DIAGNOSIS — R200 Anesthesia of skin: Secondary | ICD-10-CM

## 2012-02-15 DIAGNOSIS — R2689 Other abnormalities of gait and mobility: Secondary | ICD-10-CM

## 2012-02-20 NOTE — Addendum Note (Signed)
Addended by: Rene Paci A on: 02/20/2012 10:26 AM   Modules accepted: Orders

## 2012-02-21 ENCOUNTER — Telehealth: Payer: Self-pay | Admitting: Internal Medicine

## 2012-02-21 NOTE — Telephone Encounter (Signed)
Forward  2 pages from [facility] to Dr. Rene Paci for review on 02-21-12 ym

## 2012-02-22 ENCOUNTER — Other Ambulatory Visit: Payer: Self-pay | Admitting: Internal Medicine

## 2012-03-02 ENCOUNTER — Telehealth: Payer: Self-pay | Admitting: *Deleted

## 2012-03-02 NOTE — Telephone Encounter (Signed)
Ok to resume klorcon with hctz Ok for you or [facility] to fax those tests to [place] if not already done

## 2012-03-02 NOTE — Telephone Encounter (Signed)
Notified pt with md response. Faxing records to Dr. Venetia Maxon...Raechel Chute

## 2012-03-02 NOTE — Telephone Encounter (Signed)
Pt is wanting to know does md want her to start back taking her klor con. Still taking HCTZ 12.5 mg daily. Also want a copy of her MRI & nerve test fax over to Dr. Venetia Maxon has appt with him on 03/12/12.../LMB

## 2012-03-28 ENCOUNTER — Other Ambulatory Visit: Payer: Self-pay | Admitting: Internal Medicine

## 2012-03-29 NOTE — Telephone Encounter (Signed)
Last written 01/18/2012 #180 mL with 0 refills-please advise.

## 2012-03-30 NOTE — Telephone Encounter (Signed)
Rx faxed to [facility].  

## 2012-03-31 ENCOUNTER — Other Ambulatory Visit: Payer: Self-pay | Admitting: Internal Medicine

## 2012-05-02 ENCOUNTER — Ambulatory Visit (INDEPENDENT_AMBULATORY_CARE_PROVIDER_SITE_OTHER): Payer: Medicare Other | Admitting: Internal Medicine

## 2012-05-02 ENCOUNTER — Encounter: Payer: Self-pay | Admitting: Internal Medicine

## 2012-05-02 VITALS — BP 118/60 | HR 70 | Temp 98.2°F | Ht 63.0 in | Wt 168.4 lb

## 2012-05-02 DIAGNOSIS — Z23 Encounter for immunization: Secondary | ICD-10-CM

## 2012-05-02 DIAGNOSIS — Z1239 Encounter for other screening for malignant neoplasm of breast: Secondary | ICD-10-CM

## 2012-05-02 DIAGNOSIS — Z780 Asymptomatic menopausal state: Secondary | ICD-10-CM

## 2012-05-02 DIAGNOSIS — E119 Type 2 diabetes mellitus without complications: Secondary | ICD-10-CM

## 2012-05-02 DIAGNOSIS — Z1231 Encounter for screening mammogram for malignant neoplasm of breast: Secondary | ICD-10-CM

## 2012-05-02 DIAGNOSIS — Z0000 Encounter for general adult medical examination without abnormal findings: Secondary | ICD-10-CM

## 2012-05-02 DIAGNOSIS — K589 Irritable bowel syndrome without diarrhea: Secondary | ICD-10-CM

## 2012-05-02 MED ORDER — BLACK COHOSH 80 MG PO CAPS: 80.0000 mg | ORAL_CAPSULE | Freq: Two times a day (BID) | ORAL | Status: DC

## 2012-05-02 MED ORDER — BEANO PO TABS: 1.0000 | ORAL_TABLET | Freq: Three times a day (TID) | ORAL | Status: DC

## 2012-05-02 NOTE — Assessment & Plan Note (Signed)
Constipation prone - on Miralax, stool softner  Also chronic bloat and gas Trial amitiza 04/2011 ineffective recommended Beano supplement in addition to probiotic

## 2012-05-02 NOTE — Patient Instructions (Addendum)
It was good to see you today. We have reviewed your prior records including labs and tests today Health Maintenance reviewed - tetanus updated today -all recommended immunizations and age-appropriate screenings are up-to-date. we'll make referral for mammogram screening. Our office will contact you regarding appointment(s) once made. will schedule a DEXA bone density and call you with these results Reduce the dose of losartan to half tablet once daily for blood pressure and kidney protection Hold metformin for one week Start black cohosh over-the-counter twice daily for hot flash symptoms Start Beano supplement 1 tablet before each meal for one month to help with gas symptoms -continue daily if this medication is helpful Other medications reviewed and updated -see list below Continue working with your other specialist and therapists as discussed Please schedule followup in 3-4 months for diabetes mellitus check, call sooner if problems. Diabetes and Exercise Regular exercise is important and can help:    Control blood glucose (sugar).   Decrease blood pressure.     Control blood lipids (cholesterol, triglycerides).   Improve overall health.  BENEFITS FROM EXERCISE  Improved fitness.   Improved flexibility.   Improved endurance.   Increased bone density.   Weight control.   Increased muscle strength.   Decreased body fat.   Improvement of the body's use of insulin, a hormone.   Increased insulin sensitivity.   Reduction of insulin needs.   Reduced stress and tension.   Helps you feel better.  People with diabetes who add exercise to their lifestyle gain additional benefits, including:  Weight loss.   Reduced appetite.   Improvement of the body's use of blood glucose.   Decreased risk factors for heart disease:   Lowering of cholesterol and triglycerides.   Raising the level of good cholesterol (high-density lipoproteins, HDL).   Lowering blood sugar.    Decreased blood pressure.  TYPE 1 DIABETES AND EXERCISE  Exercise will usually lower your blood glucose.   If blood glucose is greater than 240 mg/dl, check urine ketones. If ketones are present, do not exercise.   Location of the insulin injection sites may need to be adjusted with exercise. Avoid injecting insulin into areas of the body that will be exercised. For example, avoid injecting insulin into:   The arms when playing tennis.   The legs when jogging. For more information, discuss this with your caregiver.   Keep a record of:   Food intake.   Type and amount of exercise.   Expected peak times of insulin action.   Blood glucose levels.  Do this before, during, and after exercise. Review your records with your caregiver. This will help you to develop guidelines for adjusting food intake and insulin amounts.   TYPE 2 DIABETES AND EXERCISE  Regular physical activity can help control blood glucose.   Exercise is important because it may:   Increase the body's sensitivity to insulin.   Improve blood glucose control.   Exercise reduces the risk of heart disease. It decreases serum cholesterol and triglycerides. It also lowers blood pressure.   Those who take insulin or oral hypoglycemic agents should watch for signs of hypoglycemia. These signs include dizziness, shaking, sweating, chills, and confusion.   Body water is lost during exercise. It must be replaced. This will help to avoid loss of body fluids (dehydration) or heat stroke.  Be sure to talk to your caregiver before starting an exercise program to make sure it is safe for you. Remember, any activity is better than none.  Document Released: 09/03/2003 Document Revised: 09/05/2011 Document Reviewed: 12/18/2008 [facility] Patient Information 2013 [place], [place].

## 2012-05-02 NOTE — Progress Notes (Signed)
Subjective:    Patient ID: [name], female    DOB: 07-19-1940, 71 y.o.   MRN: [id number]  HPI  Here for medicare wellness  Diet: heart healthy, diabetic Physical activity: sedentary Depression/mood screen: negative Hearing: intact to whispered voice Visual acuity: grossly normal, performs annual eye exam  ADLs: capable Fall risk: none Home safety: good Cognitive evaluation: intact to orientation, naming, recall and repetition EOL planning: adv directives, full code/ I agree  I have personally reviewed and have noted 1. The patient's medical and social history 2. Their use of alcohol, tobacco or illicit drugs 3. Their current medications and supplements 4. The patient's functional ability including ADL's, fall risks, home safety risks and hearing or visual impairment. 5. Diet and physical activities 6. Evidence for depression or mood disorders  also reviewed chronic medical issues today:  hypertension - reports compliance with ongoing medical treatment and no changes in medication dose or frequency. occassional dizziness   DM2 - reports compliance with ongoing medical treatment and no changes in medication dose or frequency. checks sugars every morning  - if over 125, takes 1/2 tab glipizide; whole tab if >150 - this occurs 3-4 d/wk - no hypoglycemia symptoms - reports metformin increasing "gas"   osteoarthritis - chronic pain in R knee and both shoulders - stopped ultracet (ineffective)- takes tylenol arthritis with incomplete relief of symptoms-  s/p steroid shot due to flare fall 03/2010 - walking as weather and pain permits  IBS-C/GERD - chronic gas/abd bloating -somewhat improved with probiotics - food dairy reviewed - exac following hosp for diverticulitis 03/2010 -resumed H2B 01/2012   Past Medical History  Diagnosis Date  . Diabetes mellitus, type 2   . GERD (gastroesophageal reflux disease)   . Depression   . Seizure disorder   . Dyslipidemia   . OSA on CPAP     . Osteoarthritis of shoulder region     and Knee  . Anxiety   . Diverticulosis of colon 03/2010 hosp  . Hypertension    Family History  Problem Relation Age of Onset  . Arthritis Mother   . Heart disease Father   . Arthritis Other     Grandmother  . Diabetes Other     Grandmother   History  Substance Use Topics  . Smoking status: Former Smoker    Quit date: 10/19/1985  . Smokeless tobacco: Not on file  . Alcohol Use:     Review of Systems  Constitutional: Negative for fever and unexpected weight change.  Respiratory: Negative for shortness of breath.   Cardiovascular: Negative for chest pain and palpitations.  Neurological: Negative for seizures and facial asymmetry.  Psychiatric/Behavioral: Negative for hallucinations.  No other specific complaints in a complete review of systems (except as listed in HPI above).      Objective:   Physical Exam BP 118/60  Pulse 70  Temp 98.2 F (36.8 C) (Oral)  Ht 5\' 3"  (1.6 m)  Wt 168 lb 6.4 oz (76.386 kg)  BMI 29.83 kg/m2  SpO2 99% Wt Readings from Last 3 Encounters:  05/02/12 168 lb 6.4 oz (76.386 kg)  02/08/12 171 lb 12.8 oz (77.928 kg)  11/03/11 173 lb 6.4 oz (78.654 kg)   Constitutional: She is overweight; but appears well-developed and well-nourished. No distress.  Cardiovascular: Normal rate, regular rhythm and normal heart sounds.  No murmur heard.  No BLE edema Pulmonary/Chest: Effort normal and breath sounds normal. No respiratory distress. She has no wheezes. Abdomen: SNTND, +BS, no R/G  Neurologic: Cranial nerves II through XII symmetrically intact, moves all extremities well. No dysarthria or aphasia. Coordination normal -    Lab Results  Component Value Date   WBC 5.4 02/08/2012   HGB 12.3 02/08/2012   HCT 37.0 02/08/2012   PLT 315.0 02/08/2012   CHOL 213* 11/03/2011   TRIG 155.0* 11/03/2011   HDL 65.80 11/03/2011   LDLDIRECT 132.7 11/03/2011   ALT 13 02/08/2012   AST 23 02/08/2012   NA 134* 02/13/2012   K 4.1  02/13/2012   CL 99 02/13/2012   CREATININE 0.6 02/13/2012   BUN 8 02/13/2012   CO2 28 02/13/2012   TSH 1.60 02/08/2012   INR 1.8* 04/08/2008   HGBA1C 6.7* 02/08/2012   MICROALBUR 1.0 10/07/2009   Lab Results  Component Value Date   VITAMINB12 962* 02/08/2012       Assessment & Plan:  AWV/v70.0 - Today patient counseled on age appropriate routine health concerns for screening and prevention, each reviewed and up to date or declined. Immunizations reviewed and up to date or declined. Labs/ECG reviewed. Risk factors for depression reviewed and negative. Hearing function and visual acuity are intact. ADLs screened and addressed as needed. Functional ability and level of safety reviewed and appropriate. Education, counseling and referrals performed based on assessed risks today. Patient provided with a copy of personalized plan for preventive services.

## 2012-05-02 NOTE — Assessment & Plan Note (Addendum)
Metformin + prn glipizide - will hold metformin for one week because of GI issues - see below On ASA, ARB and declines statin associated with peripheral neuropathy - started gabapentin 01/2012 for same Follows with optho and podiatry q 6-34mo  Lab Results  Component Value Date   HGBA1C 6.7* 02/08/2012

## 2012-05-02 NOTE — Addendum Note (Signed)
Addended by: Deatra James on: 05/02/2012 12:52 PM   Modules accepted: Orders

## 2012-05-08 ENCOUNTER — Other Ambulatory Visit: Payer: Self-pay | Admitting: Internal Medicine

## 2012-05-08 NOTE — Telephone Encounter (Signed)
F[facility]ed [facility] to [facility].../lmb 

## 2012-05-11 ENCOUNTER — Other Ambulatory Visit: Payer: Self-pay | Admitting: Internal Medicine

## 2012-06-07 ENCOUNTER — Ambulatory Visit (HOSPITAL_COMMUNITY)
Admission: RE | Admit: 2012-06-07 | Discharge: 2012-06-07 | Disposition: A | Discharge status: Home / Self Care | Payer: Medicare Other | Source: Ambulatory Visit | Attending: Internal Medicine | Admitting: Internal Medicine

## 2012-06-07 DIAGNOSIS — Z1239 Encounter for other screening for malignant neoplasm of breast: Secondary | ICD-10-CM

## 2012-06-07 DIAGNOSIS — Z1231 Encounter for screening mammogram for malignant neoplasm of breast: Secondary | ICD-10-CM | POA: Insufficient documentation

## 2012-06-13 ENCOUNTER — Other Ambulatory Visit: Payer: Self-pay | Admitting: Internal Medicine

## 2012-06-13 NOTE — Telephone Encounter (Signed)
F[facility]ed [facility] to [facility].../lmb 

## 2012-07-09 ENCOUNTER — Other Ambulatory Visit: Payer: Self-pay | Admitting: Internal Medicine

## 2012-08-02 ENCOUNTER — Ambulatory Visit (INDEPENDENT_AMBULATORY_CARE_PROVIDER_SITE_OTHER): Payer: 59 | Admitting: Internal Medicine

## 2012-08-02 ENCOUNTER — Other Ambulatory Visit: Payer: Self-pay | Admitting: Internal Medicine

## 2012-08-02 ENCOUNTER — Encounter: Payer: Self-pay | Admitting: Internal Medicine

## 2012-08-02 VITALS — BP 120/62 | HR 76 | Temp 98.0°F | Ht 63.0 in | Wt 168.4 lb

## 2012-08-02 DIAGNOSIS — G4733 Obstructive sleep apnea (adult) (pediatric): Secondary | ICD-10-CM

## 2012-08-02 DIAGNOSIS — E119 Type 2 diabetes mellitus without complications: Secondary | ICD-10-CM

## 2012-08-02 DIAGNOSIS — K589 Irritable bowel syndrome without diarrhea: Secondary | ICD-10-CM

## 2012-08-02 DIAGNOSIS — M1990 Unspecified osteoarthritis, unspecified site: Secondary | ICD-10-CM

## 2012-08-02 MED ORDER — TRAMADOL HCL 50 MG PO TABS: 50.0000 mg | ORAL_TABLET | Freq: Four times a day (QID) | ORAL | Status: DC | PRN

## 2012-08-02 MED ORDER — HYDROCODONE-ACETAMINOPHEN 5-325 MG PO TABS: 0.5000 | ORAL_TABLET | Freq: Three times a day (TID) | ORAL | Status: DC | PRN

## 2012-08-02 NOTE — Patient Instructions (Signed)
It was good to see you today. We have reviewed your prior records including labs and tests today Use tramadol (Ultram) before Norco (hydrocodone) for osteoarthritis pain -Your prescription(s) have been submitted to your pharmacy. Please take as directed and contact our office if you believe you are having problem(s) with the medication(s). Other medications reviewed and updated -see list below Refill on medication(s) as discussed today. Please schedule followup in 4 months for diabetes mellitus check, call sooner if problems. Diabetes and Exercise Regular exercise is important and can help:    Control blood glucose (sugar).   Decrease blood pressure.     Control blood lipids (cholesterol, triglycerides).   Improve overall health.  BENEFITS FROM EXERCISE  Improved fitness.   Improved flexibility.   Improved endurance.   Increased bone density.   Weight control.   Increased muscle strength.   Decreased body fat.   Improvement of the body's use of insulin, a hormone.   Increased insulin sensitivity.   Reduction of insulin needs.   Reduced stress and tension.   Helps you feel better.  People with diabetes who add exercise to their lifestyle gain additional benefits, including:  Weight loss.   Reduced appetite.   Improvement of the body's use of blood glucose.   Decreased risk factors for heart disease:   Lowering of cholesterol and triglycerides.   Raising the level of good cholesterol (high-density lipoproteins, HDL).   Lowering blood sugar.   Decreased blood pressure.  TYPE 1 DIABETES AND EXERCISE  Exercise will usually lower your blood glucose.   If blood glucose is greater than 240 mg/dl, check urine ketones. If ketones are present, do not exercise.   Location of the insulin injection sites may need to be adjusted with exercise. Avoid injecting insulin into areas of the body that will be exercised. For example, avoid injecting insulin into:   The  arms when playing tennis.   The legs when jogging. For more information, discuss this with your caregiver.   Keep a record of:   Food intake.   Type and amount of exercise.   Expected peak times of insulin action.   Blood glucose levels.  Do this before, during, and after exercise. Review your records with your caregiver. This will help you to develop guidelines for adjusting food intake and insulin amounts.   TYPE 2 DIABETES AND EXERCISE  Regular physical activity can help control blood glucose.   Exercise is important because it may:   Increase the body's sensitivity to insulin.   Improve blood glucose control.   Exercise reduces the risk of heart disease. It decreases serum cholesterol and triglycerides. It also lowers blood pressure.   Those who take insulin or oral hypoglycemic agents should watch for signs of hypoglycemia. These signs include dizziness, shaking, sweating, chills, and confusion.   Body water is lost during exercise. It must be replaced. This will help to avoid loss of body fluids (dehydration) or heat stroke.  Be sure to talk to your caregiver before starting an exercise program to make sure it is safe for you. Remember, any activity is better than none.   Document Released: 09/03/2003 Document Revised: 09/05/2011 Document Reviewed: 12/18/2008 [facility] Patient Information 2013 [place], [place].

## 2012-08-02 NOTE — Assessment & Plan Note (Signed)
Continue norco prn for pain try tramadol - erx done GI issues make NSAIDs unattractive option 

## 2012-08-02 NOTE — Progress Notes (Signed)
  Subjective:    Patient ID: [name], female    DOB: [birth year], 72 y.o.   MRN: [id number]  HPI  Here for follow up - reviewed chronic medical issues today:  hypertension - reports compliance with ongoing medical treatment and no changes in medication dose or frequency. occassional dizziness   DM2 - reports compliance with ongoing medical treatment and no changes in medication dose or frequency. checks sugars every morning  - if over 125, takes 1/2 tab glipizide; whole tab if >150 - this occurs 3-4 d/wk - no hypoglycemia symptoms - reports metformin increases abdominal "gas"   osteoarthritis - chronic pain in R knee and both shoulders - takes tylenol arthritis with incomplete relief of symptoms- s/p steroid shot fall 03/2010 - walking as weather and pain permits  IBS-C/GERD - chronic gas/abd bloating -somewhat improved with probiotics - food dairy reviewed - exac following hosp for diverticulitis 03/2010 -resumed H2B 01/2012   Past Medical History  Diagnosis Date  . Diabetes mellitus, type 2   . GERD (gastroesophageal reflux disease)   . Depression   . Seizure disorder   . Dyslipidemia   . OSA on CPAP   . Osteoarthritis of shoulder region     and Knee  . Anxiety   . Diverticulosis of colon 03/2010 hosp  . Hypertension     Review of Systems  Constitutional: Positive for fatigue. Negative for fever and unexpected weight change.  Cardiovascular: Negative for chest pain and palpitations.  Musculoskeletal: Positive for arthralgias. Negative for joint swelling.       Objective:   Physical Exam BP 120/62  Pulse 76  Temp 98 F (36.7 C) (Oral)  Ht 5\' 3"  (1.6 m)  Wt 168 lb 6.4 oz (76.386 kg)  BMI 29.83 kg/m2  SpO2 97% Wt Readings from Last 3 Encounters:  08/02/12 168 lb 6.4 oz (76.386 kg)  05/02/12 168 lb 6.4 oz (76.386 kg)  02/08/12 171 lb 12.8 oz (77.928 kg)   Constitutional: She is overweight; but appears well-developed and well-nourished. No distress.   Cardiovascular: Normal rate, regular rhythm and normal heart sounds.  No murmur heard.  No BLE edema Pulmonary/Chest: Effort normal and breath sounds normal. No respiratory distress. She has no wheezes. Abdomen: SNTND, +BS, no R/G MSkel- no effusions, FROM Neurologic: Cranial nerves II through XII symmetrically intact, moves all extremities well. No dysarthria or aphasia. Coordination normal -   Lab Results  Component Value Date   WBC 5.4 02/08/2012   HGB 12.3 02/08/2012   HCT 37.0 02/08/2012   PLT 315.0 02/08/2012   CHOL 213* 11/03/2011   TRIG 155.0* 11/03/2011   HDL 65.80 11/03/2011   LDLDIRECT 132.7 11/03/2011   ALT 13 02/08/2012   AST 23 02/08/2012   NA 134* 02/13/2012   K 4.1 02/13/2012   CL 99 02/13/2012   CREATININE 0.6 02/13/2012   BUN 8 02/13/2012   CO2 28 02/13/2012   TSH 1.60 02/08/2012   INR 1.8* 04/08/2008   HGBA1C 6.7* 02/08/2012   MICROALBUR 1.0 10/07/2009   Lab Results  Component Value Date   VITAMINB12 962* 02/08/2012       Assessment & Plan:   See problem list. Medications and labs reviewed today.  Time spent with pt today 25 minutes, greater than 50% time spent counseling patient on diabetes, IBS, osteoarthritis pain mgmt and medication review. Also review of prior records

## 2012-08-02 NOTE — Assessment & Plan Note (Signed)
Constipation prone - on Miralax, stool softner  Also chronic bloat and gas, unchanged symptoms  Trial amitiza 04/2011 ineffective recommended Beano supplement in addition to probiotic -

## 2012-08-02 NOTE — Assessment & Plan Note (Signed)
Compliance with CPAP reviewed The current medical regimen is effective;  continue present plan and medications.

## 2012-08-02 NOTE — Assessment & Plan Note (Signed)
Metformin + prn glipizide -  On ASA, ARB and declines statin associated with peripheral neuropathy - started gabapentin 01/2012 for same Follows with optho and podiatry q 6-98mo  Lab Results  Component Value Date   HGBA1C 6.7* 02/08/2012

## 2012-08-09 ENCOUNTER — Other Ambulatory Visit: Payer: 59

## 2012-08-11 ENCOUNTER — Other Ambulatory Visit: Payer: Self-pay | Admitting: Internal Medicine

## 2012-08-16 ENCOUNTER — Ambulatory Visit (INDEPENDENT_AMBULATORY_CARE_PROVIDER_SITE_OTHER)
Admission: RE | Admit: 2012-08-16 | Discharge: 2012-08-16 | Disposition: A | Discharge status: Home / Self Care | Payer: 59 | Source: Ambulatory Visit | Attending: Internal Medicine | Admitting: Internal Medicine

## 2012-08-16 ENCOUNTER — Telehealth: Payer: Self-pay | Admitting: Internal Medicine

## 2012-08-16 DIAGNOSIS — Z780 Asymptomatic menopausal state: Secondary | ICD-10-CM

## 2012-08-16 DIAGNOSIS — M25519 Pain in unspecified shoulder: Secondary | ICD-10-CM

## 2012-08-16 NOTE — Telephone Encounter (Signed)
Pt req phone call from the nurse, pt is very concern about the x ray. Please call pt.

## 2012-08-16 NOTE — Telephone Encounter (Signed)
Please call - what xray?

## 2012-08-17 NOTE — Telephone Encounter (Signed)
Called pt back she stated had her bone density done yesterday, but since she saw md still having ongoing shoulder pain. ? If coming from vertebrae. Not sure if she need to go back and see Dr. Venetia Maxon. Pt states she was taking the hydrocodone & tramadol but she had to stop taking the hydrocodone because of the side effect she was having. Felt like she was out of this world. Couldn't think. Was alternating both every 8 hours. Still not able to lift arm up. Wanting to know should she have xray of her shoulder....[name]

## 2012-08-17 NOTE — Telephone Encounter (Signed)
Called pt to ask about xray, pt would like a return call from [place].

## 2012-08-17 NOTE — Telephone Encounter (Signed)
Will order x-ray of shoulder and referred to orthopedist for evaluation of her shoulder. Please clarify which shoulder before placing order Okay to wait on seeing Dr. Venetia Maxon until after orthopedic evaluation for shoulder

## 2012-08-17 NOTE — Telephone Encounter (Signed)
Notified pt with md response. Pt states it the right shoulder....[name]

## 2012-08-20 ENCOUNTER — Ambulatory Visit (INDEPENDENT_AMBULATORY_CARE_PROVIDER_SITE_OTHER)
Admission: RE | Admit: 2012-08-20 | Discharge: 2012-08-20 | Disposition: A | Discharge status: Home / Self Care | Payer: 59 | Source: Ambulatory Visit | Attending: Internal Medicine | Admitting: Internal Medicine

## 2012-08-20 DIAGNOSIS — M25519 Pain in unspecified shoulder: Secondary | ICD-10-CM

## 2012-08-22 ENCOUNTER — Telehealth: Payer: Self-pay

## 2012-08-22 ENCOUNTER — Other Ambulatory Visit: Payer: Self-pay | Admitting: Internal Medicine

## 2012-08-22 NOTE — Telephone Encounter (Signed)
Pt called requesting a return call from Dr. Diamantina Monks CMA. Call was returned to pt requesting a call back with detailed request via triage VM.

## 2012-08-23 ENCOUNTER — Telehealth: Payer: Self-pay | Admitting: Internal Medicine

## 2012-08-23 MED ORDER — HYDROCHLOROTHIAZIDE 12.5 MG PO TABS: ORAL_TABLET | ORAL | Status: DC

## 2012-08-23 NOTE — Telephone Encounter (Signed)
Notified pt rx sent to [facility]...Raechel Chute

## 2012-08-23 NOTE — Telephone Encounter (Signed)
[name] needs her Hydrochlorothazide 12.5 sent to [facility].  The fax number is [phone].  She only has 2 pills left.  Her insurance has changed mail order pharmacies. She wants a call from [place] to know it is done.

## 2012-09-08 ENCOUNTER — Other Ambulatory Visit: Payer: Self-pay | Admitting: Internal Medicine

## 2012-09-12 ENCOUNTER — Telehealth: Payer: Self-pay | Admitting: Internal Medicine

## 2012-09-12 ENCOUNTER — Ambulatory Visit (INDEPENDENT_AMBULATORY_CARE_PROVIDER_SITE_OTHER)
Admission: RE | Admit: 2012-09-12 | Discharge: 2012-09-12 | Disposition: A | Discharge status: Home / Self Care | Payer: 59 | Source: Ambulatory Visit | Attending: Internal Medicine | Admitting: Internal Medicine

## 2012-09-12 ENCOUNTER — Encounter: Payer: Self-pay | Admitting: Internal Medicine

## 2012-09-12 ENCOUNTER — Other Ambulatory Visit (INDEPENDENT_AMBULATORY_CARE_PROVIDER_SITE_OTHER): Payer: 59

## 2012-09-12 ENCOUNTER — Ambulatory Visit (INDEPENDENT_AMBULATORY_CARE_PROVIDER_SITE_OTHER): Payer: 59 | Admitting: Internal Medicine

## 2012-09-12 VITALS — BP 120/72 | HR 75 | Temp 98.5°F

## 2012-09-12 DIAGNOSIS — R110 Nausea: Secondary | ICD-10-CM

## 2012-09-12 DIAGNOSIS — Z8719 Personal history of other diseases of the digestive system: Secondary | ICD-10-CM

## 2012-09-12 DIAGNOSIS — R1031 Right lower quadrant pain: Secondary | ICD-10-CM

## 2012-09-12 LAB — CBC WITH DIFFERENTIAL/PLATELET
Eosinophils Absolute: 0.1 10*3/uL (ref 0.0–0.7)
MCHC: 33.7 g/dL (ref 30.0–36.0)
MCV: 89.4 fl (ref 78.0–100.0)
Monocytes Absolute: 0.5 10*3/uL (ref 0.1–1.0)
Neutrophils Relative %: 47 % (ref 43.0–77.0)
Platelets: 309 10*3/uL (ref 150.0–400.0)

## 2012-09-12 LAB — URINALYSIS, ROUTINE W REFLEX MICROSCOPIC
Hgb urine dipstick: NEGATIVE
Urine Glucose: NEGATIVE
Urobilinogen, UA: 0.2 (ref 0.0–1.0)

## 2012-09-12 MED ORDER — CIPROFLOXACIN HCL 500 MG PO TABS: 500.0000 mg | ORAL_TABLET | Freq: Two times a day (BID) | ORAL | Status: DC

## 2012-09-12 MED ORDER — METRONIDAZOLE 500 MG PO TABS: 500.0000 mg | ORAL_TABLET | Freq: Three times a day (TID) | ORAL | Status: DC

## 2012-09-12 MED ORDER — OXYCODONE-ACETAMINOPHEN 10-325 MG PO TABS: 1.0000 | ORAL_TABLET | Freq: Three times a day (TID) | ORAL | Status: DC | PRN

## 2012-09-12 NOTE — Telephone Encounter (Signed)
Patient Information:  Caller Name: [name]  Phone: [phone]  Patient: [name], [name]  Gender: Female  DOB: [birth year]  Age: 71 Years  PCP: Rene Paci (Adults only)  Office Follow Up:  Does the office need to follow up with this patient?: Yes  Instructions For The Office: If pt can be worked in sooner please contact her.  RN Note:  [place] is open will schedule appt. today.   Symptoms  Reason For Call & Symptoms: Pt states right side in the pelvic area with lump and pain.  Reviewed Health History In EMR: Yes  Reviewed Medications In EMR: Yes  Reviewed Allergies In EMR: Yes  Reviewed Surgeries / Procedures: Yes  Date of Onset of Symptoms: 09/06/2012  Guideline(s) Used:  Abdominal Pain - Female  Disposition Per Guideline:   Go to ED Now (or to Office with PCP Approval)  Reason For Disposition Reached:   Constant abdominal pain lasting > 2 hours  Advice Given:  N/A  Patient Will Follow Care Advice:  YES  Appointment Scheduled:  09/12/2012 16:15:00 Appointment Scheduled Provider:  Rene Paci (Adults only)

## 2012-09-12 NOTE — Patient Instructions (Signed)
It was good to see you today. Test(s) ordered today today - labs and xray. Your results will be released to MyChart (or called to you) after review, usually within 72hours after test completion. If any changes need to be made, you will be notified at that same time. CT scan tomorrow -  Until then, start Cipro and Flagyl antibiotics for possible infection and use Percocet as needed for pain control Your prescription(s) have been submitted to your pharmacy (Percocet prescription given to you to take to pharmacy). Please take as directed and contact our office if you believe you are having problem(s) with the medication(s). if your symptoms continue to worsen (pain, fever, etc), or if you are unable take anything by mouth (pills, fluids, etc), you should go to the emergency room for further evaluation and treatment.

## 2012-09-12 NOTE — Progress Notes (Signed)
Subjective:    Patient ID: [name], female    DOB: 01/18/1941, 72 y.o.   MRN: [id number]  Abdominal Pain This is a new problem. The current episode started in the past 7 days. The onset quality is gradual. The problem occurs constantly. The most recent episode lasted 3 days. The problem has been gradually worsening. The pain is located in the RLQ. The pain is at a severity of 8/10. The pain is severe. The quality of the pain is sharp, cramping and aching. The abdominal pain does not radiate. Associated symptoms include anorexia, constipation, headaches and nausea. Pertinent negatives include no diarrhea, dysuria, fever, hematochezia, hematuria, melena, myalgias, vomiting or weight loss. Nothing aggravates the pain. The pain is relieved by nothing. She has tried oral narcotic analgesics for the symptoms. The treatment provided no relief. Her past medical history is significant for abdominal surgery. There is no history of colon cancer, gallstones, pancreatitis or PUD.    Past Medical History  Diagnosis Date  . Diabetes mellitus, type 2   . GERD (gastroesophageal reflux disease)   . Depression   . Seizure disorder   . Dyslipidemia   . OSA on CPAP   . Osteoarthritis of shoulder region     and Knee  . Anxiety   . Diverticulosis of colon 03/2010 hosp  . Hypertension     Review of Systems  Constitutional: Negative for fever and weight loss.  Gastrointestinal: Positive for nausea, abdominal pain, constipation and anorexia. Negative for vomiting, diarrhea, melena and hematochezia.  Genitourinary: Negative for dysuria and hematuria.  Musculoskeletal: Negative for myalgias.  Neurological: Positive for headaches.       Objective:   Physical Exam BP 120/72  Pulse 75  Temp(Src) 98.5 F (36.9 C) (Oral)  SpO2 97% Wt Readings from Last 3 Encounters:  08/02/12 168 lb 6.4 oz (76.386 kg)  05/02/12 168 lb 6.4 oz (76.386 kg)  02/08/12 171 lb 12.8 oz (77.928 kg)   Constitutional: She is  uncomfortable and mildly anxious Eyes: Conjunctivae and EOM are normal. Pupils are equal, round, and reactive to light. No scleral icterus.  Neck: Normal range of motion. Neck supple. No JVD present. No thyromegaly present.  Cardiovascular: Normal rate, regular rhythm and normal heart sounds.  No murmur heard. No BLE edema. Pulmonary/Chest: Effort normal and breath sounds normal. No respiratory distress. She has no wheezes.  Abdominal: Soft. Bowel sounds are diminished - occasional "tinkle". She exhibits no distension. There is point tenderness over RLQ, ?firm 2cm region over site of max tenderness, but no hernia appreciated. no masses Musculoskeletal: Normal range of motion, no joint effusions. No gross deformities Neurological: She is alert and oriented to person, place, and time. No cranial nerve deficit. Coordination normal.  Skin: Skin is warm and dry. No rash noted. No erythema.  Psychiatric: She has a mildly anxious mood and affect. Her behavior is normal. Judgment and thought content normal.   Lab Results  Component Value Date   WBC 5.4 02/08/2012   HGB 12.3 02/08/2012   HCT 37.0 02/08/2012   PLT 315.0 02/08/2012   GLUCOSE 92 02/13/2012   CHOL 213* 11/03/2011   TRIG 155.0* 11/03/2011   HDL 65.80 11/03/2011   LDLDIRECT 132.7 11/03/2011   LDLCALC  Value: 130        Total Cholesterol/HDL:CHD Risk Coronary Heart Disease Risk Table                     Men   Women  1/2  Average Risk   3.4   3.3* 08/05/2007   ALT 13 02/08/2012   AST 23 02/08/2012   NA 134* 02/13/2012   K 4.1 02/13/2012   CL 99 02/13/2012   CREATININE 0.6 02/13/2012   BUN 8 02/13/2012   CO2 28 02/13/2012   TSH 1.60 02/08/2012   INR 1.8* 04/08/2008   HGBA1C 6.7* 02/08/2012   MICROALBUR 1.0 10/07/2009        Assessment & Plan:   Acute RLQ pain x 3-4 days Diminished bowel sounds r/o pSBO Hx left side diverticulitis (03/2010 Ct reviewed)  Check labs and UA now Check KUB r/o early obst Refer for CT a/p with CM-  Begin empiric cipro  and flagy + percocet prn pending these results Patient instructed to go to the emergency room if pain worsens, unable to tolerate by mouth or other problems prior to completing outpatient workup - Pt expresses understanding and agrees

## 2012-09-13 ENCOUNTER — Telehealth: Payer: Self-pay | Admitting: *Deleted

## 2012-09-13 ENCOUNTER — Ambulatory Visit (INDEPENDENT_AMBULATORY_CARE_PROVIDER_SITE_OTHER)
Admission: RE | Admit: 2012-09-13 | Discharge: 2012-09-13 | Disposition: A | Discharge status: Home / Self Care | Payer: 59 | Source: Ambulatory Visit | Attending: Internal Medicine | Admitting: Internal Medicine

## 2012-09-13 DIAGNOSIS — R110 Nausea: Secondary | ICD-10-CM

## 2012-09-13 DIAGNOSIS — R1031 Right lower quadrant pain: Secondary | ICD-10-CM

## 2012-09-13 DIAGNOSIS — Z8719 Personal history of other diseases of the digestive system: Secondary | ICD-10-CM

## 2012-09-13 LAB — HEPATIC FUNCTION PANEL
Alkaline Phosphatase: 61 U/L (ref 39–117)
Bilirubin, Direct: 0.1 mg/dL (ref 0.0–0.3)

## 2012-09-13 LAB — BASIC METABOLIC PANEL
BUN: 10 mg/dL (ref 6–23)
CO2: 30 mEq/L (ref 19–32)
Chloride: 94 mEq/L — ABNORMAL LOW (ref 96–112)
Creatinine, Ser: 0.8 mg/dL (ref 0.4–1.2)

## 2012-09-13 MED ORDER — IOHEXOL 300 MG/ML  SOLN
100.0000 mL | Freq: Once | INTRAMUSCULAR | Status: AC | PRN
  Administered 2012-09-13: 100 mL via INTRAVENOUS

## 2012-09-13 NOTE — Telephone Encounter (Signed)
Left msg on vm wanting to know how far apart she need to take meds that was rx yesterday...lmb

## 2012-09-13 NOTE — Telephone Encounter (Signed)
Pt return call back gave her md response,,,/lmb

## 2012-09-13 NOTE — Telephone Encounter (Signed)
Called pt no answer x's 10 rings.../lmb 

## 2012-09-13 NOTE — Telephone Encounter (Signed)
Which meds? Take cipro 2x/d (at least 8h apart), flagyl 3x/d (at least 6h apart) and percocet when/if needed for pain -

## 2012-09-14 ENCOUNTER — Telehealth: Payer: Self-pay | Admitting: Internal Medicine

## 2012-09-14 NOTE — Telephone Encounter (Signed)
[name] is calling with results for CT ordered by Rene Paci (Adults only). The results are concerning for potential early acute diverticulitis of the distal descending colon and proximal sigmoid colon. this is not definitive but given patient's symptoms these findings are certainly suspicious. No evidence of diverticular abscess at this time. and were read by Dr. Llana Aliment.

## 2012-09-14 NOTE — Telephone Encounter (Signed)
Pt has been made aware of ct results. See results note...lmb

## 2012-09-17 ENCOUNTER — Telehealth: Payer: Self-pay

## 2012-09-17 NOTE — Telephone Encounter (Signed)
Pt called stating that her fasting CBG yesterday morning was 176 and she decided to take a Metformin although she has previously decided to D/C because diverticulitis Dx (?). Pt says that after taking medication her CBG came down to 115 with no adverse effect GI problems. Pt is requesting MD advisement on whether to restart medication. She also would like MD to advise on medication to help with chronic constipation. She says she has tried Miralax, colace and other stool softeners with minimal relief, please advise.

## 2012-09-17 NOTE — Telephone Encounter (Signed)
Notified pt with md response.../lmb 

## 2012-09-17 NOTE — Telephone Encounter (Signed)
Ok to resume metformin once daily - i have added to med list Use Miralax BID-TID everyday (not prn) with 8oz liquid each time - hold one dose if diarrhea

## 2012-09-21 ENCOUNTER — Telehealth: Payer: Self-pay | Admitting: Internal Medicine

## 2012-09-21 NOTE — Telephone Encounter (Signed)
Pt has taken all of the medicine for the diverticulitis.   She has 2 Oxycodone pills left.  When she uses those, should she get a refill on oxycodone or is it ok to take the Tramadol that she has for this condition.

## 2012-09-21 NOTE — Telephone Encounter (Signed)
Ok to return to tramadol prn pain when out of oxy - thanks

## 2012-09-24 NOTE — Telephone Encounter (Signed)
Notified pt with md response.../lmb 

## 2012-10-02 ENCOUNTER — Other Ambulatory Visit: Payer: Self-pay | Admitting: Internal Medicine

## 2012-10-02 NOTE — Telephone Encounter (Signed)
Pt called stated that [facility] have been trying to get [place] to response to their req on Glucocard vital test strips. Please check. Pt stated she check her blood glucose twice a day.

## 2012-10-03 NOTE — Telephone Encounter (Signed)
Notified pt received fax this morning & was fax back to home care...[name]

## 2012-10-03 NOTE — Telephone Encounter (Signed)
Received fax for diabetic supplies completed & waiting on md signature...lmb

## 2012-10-10 ENCOUNTER — Other Ambulatory Visit: Payer: Self-pay | Admitting: Internal Medicine

## 2012-10-20 ENCOUNTER — Other Ambulatory Visit: Payer: Self-pay | Admitting: Internal Medicine

## 2012-11-09 ENCOUNTER — Other Ambulatory Visit: Payer: Self-pay | Admitting: Internal Medicine

## 2012-11-13 ENCOUNTER — Other Ambulatory Visit: Payer: Self-pay | Admitting: *Deleted

## 2012-11-13 MED ORDER — ACCU-CHEK AVIVA PLUS W/DEVICE KIT: 1.0000 | PACK | Freq: Once | Status: DC

## 2012-11-13 MED ORDER — GLUCOSE BLOOD VI STRP: ORAL_STRIP | Status: DC

## 2012-11-29 ENCOUNTER — Encounter: Payer: Self-pay | Admitting: Internal Medicine

## 2012-11-29 ENCOUNTER — Other Ambulatory Visit (INDEPENDENT_AMBULATORY_CARE_PROVIDER_SITE_OTHER): Payer: 59

## 2012-11-29 ENCOUNTER — Ambulatory Visit (INDEPENDENT_AMBULATORY_CARE_PROVIDER_SITE_OTHER): Payer: 59 | Admitting: Internal Medicine

## 2012-11-29 VITALS — BP 138/82 | HR 72 | Temp 98.2°F | Wt 169.0 lb

## 2012-11-29 DIAGNOSIS — E1149 Type 2 diabetes mellitus with other diabetic neurological complication: Secondary | ICD-10-CM

## 2012-11-29 DIAGNOSIS — E785 Hyperlipidemia, unspecified: Secondary | ICD-10-CM

## 2012-11-29 DIAGNOSIS — K589 Irritable bowel syndrome without diarrhea: Secondary | ICD-10-CM

## 2012-11-29 DIAGNOSIS — I10 Essential (primary) hypertension: Secondary | ICD-10-CM

## 2012-11-29 MED ORDER — METFORMIN HCL ER 500 MG PO TB24: 500.0000 mg | ORAL_TABLET | Freq: Every day | ORAL | Status: DC

## 2012-11-29 MED ORDER — GABAPENTIN 300 MG PO CAPS: 300.0000 mg | ORAL_CAPSULE | Freq: Three times a day (TID) | ORAL | Status: DC

## 2012-11-29 MED ORDER — BIOTIN 5000 MCG PO TABS: 5000.0000 ug | ORAL_TABLET | Freq: Every day | ORAL | Status: DC

## 2012-11-29 NOTE — Assessment & Plan Note (Signed)
Metformin + prn glipizide -  On ASA, ARB and declines statin associated with peripheral neuropathy - started gabapentin 01/2012 for same Follows with optho and podiatry q 6-38mo  Lab Results  Component Value Date   HGBA1C 6.7* 02/08/2012

## 2012-11-29 NOTE — Progress Notes (Signed)
  Subjective:    Patient ID: [name], female    DOB: [birth year], 72 y.o.   MRN: [id number]  HPI  Here for follow up - reviewed chronic medical issues and interval events  Past Medical History  Diagnosis Date  . Diabetes mellitus, type 2   . GERD (gastroesophageal reflux disease)   . Depression   . Seizure disorder   . Dyslipidemia   . OSA on CPAP   . Osteoarthritis of shoulder region     and Knee  . Anxiety   . Diverticulosis of colon 03/2010 hosp  . Hypertension     Review of Systems  Constitutional: Positive for fatigue. Negative for fever and unexpected weight change.  Cardiovascular: Negative for chest pain and palpitations.  Gastrointestinal: Positive for constipation (chronic) and abdominal distention ("gas" chronic). Negative for abdominal pain.  Musculoskeletal: Positive for arthralgias. Negative for joint swelling.       Objective:   Physical Exam BP 138/82  Pulse 72  Temp(Src) 98.2 F (36.8 C) (Oral)  Wt 169 lb (76.658 kg)  BMI 29.94 kg/m2  SpO2 98% Wt Readings from Last 3 Encounters:  11/29/12 169 lb (76.658 kg)  08/02/12 168 lb 6.4 oz (76.386 kg)  05/02/12 168 lb 6.4 oz (76.386 kg)   Constitutional: She is overweight; but appears well-developed and well-nourished. No distress.  Neck: Supple, full range of motion. No anterior fullness. No thyroid nodule or enlargement. No JVD or LAD Cardiovascular: Normal rate, regular rhythm and normal heart sounds.  No murmur heard.  No BLE edema Pulmonary/Chest: Effort normal and breath sounds normal. No respiratory distress. She has no wheezes.  Lab Results  Component Value Date   WBC 6.3 09/12/2012   HGB 12.9 09/12/2012   HCT 38.2 09/12/2012   PLT 309.0 09/12/2012   CHOL 213* 11/03/2011   TRIG 155.0* 11/03/2011   HDL 65.80 11/03/2011   LDLDIRECT 132.7 11/03/2011   ALT 19 09/12/2012   AST 31 09/12/2012   NA 134* 09/12/2012   K 4.5 09/12/2012   CL 94* 09/12/2012   CREATININE 0.8 09/12/2012   BUN 10 09/12/2012   CO2 30  09/12/2012   TSH 1.60 02/08/2012   INR 1.8* 04/08/2008   HGBA1C 6.7* 02/08/2012   MICROALBUR 1.0 10/07/2009   Lab Results  Component Value Date   VITAMINB12 962* 02/08/2012       Assessment & Plan:   See problem list. Medications and labs reviewed today.

## 2012-11-29 NOTE — Assessment & Plan Note (Signed)
Intolerant of statin therapy Check annually, encourage work on diet and exercise for weight control to manage same

## 2012-11-29 NOTE — Patient Instructions (Signed)
It was good to see you today. We have reviewed your prior records including labs and tests today Test(s) ordered today. Your results will be released to MyChart (or called to you) after review, usually within 72hours after test completion. If any changes need to be made, you will be notified at that same time. Other medications reviewed and updated -Refill on medication(s) as discussed today. Try any Probiotic as discussed - check at health foods store Please schedule followup in 4 months for diabetes mellitus check, call sooner if problems. Diabetes and Exercise Regular exercise is important and can help:    Control blood glucose (sugar).   Decrease blood pressure.     Control blood lipids (cholesterol, triglycerides).   Improve overall health.  BENEFITS FROM EXERCISE  Improved fitness.   Improved flexibility.   Improved endurance.   Increased bone density.   Weight control.   Increased muscle strength.   Decreased body fat.   Improvement of the body's use of insulin, a hormone.   Increased insulin sensitivity.   Reduction of insulin needs.   Reduced stress and tension.   Helps you feel better.  People with diabetes who add exercise to their lifestyle gain additional benefits, including:  Weight loss.   Reduced appetite.   Improvement of the body's use of blood glucose.   Decreased risk factors for heart disease:   Lowering of cholesterol and triglycerides.   Raising the level of good cholesterol (high-density lipoproteins, HDL).   Lowering blood sugar.   Decreased blood pressure.  TYPE 1 DIABETES AND EXERCISE  Exercise will usually lower your blood glucose.   If blood glucose is greater than 240 mg/dl, check urine ketones. If ketones are present, do not exercise.   Location of the insulin injection sites may need to be adjusted with exercise. Avoid injecting insulin into areas of the body that will be exercised. For example, avoid injecting insulin  into:   The arms when playing tennis.   The legs when jogging. For more information, discuss this with your caregiver.   Keep a record of:   Food intake.   Type and amount of exercise.   Expected peak times of insulin action.   Blood glucose levels.  Do this before, during, and after exercise. Review your records with your caregiver. This will help you to develop guidelines for adjusting food intake and insulin amounts.   TYPE 2 DIABETES AND EXERCISE  Regular physical activity can help control blood glucose.   Exercise is important because it may:   Increase the body's sensitivity to insulin.   Improve blood glucose control.   Exercise reduces the risk of heart disease. It decreases serum cholesterol and triglycerides. It also lowers blood pressure.   Those who take insulin or oral hypoglycemic agents should watch for signs of hypoglycemia. These signs include dizziness, shaking, sweating, chills, and confusion.   Body water is lost during exercise. It must be replaced. This will help to avoid loss of body fluids (dehydration) or heat stroke.  Be sure to talk to your caregiver before starting an exercise program to make sure it is safe for you. Remember, any activity is better than none.   Document Released: 09/03/2003 Document Revised: 09/05/2011 Document Reviewed: 12/18/2008 [facility] Patient Information 2013 [place], [place].

## 2012-11-29 NOTE — Assessment & Plan Note (Signed)
BP Readings from Last 3 Encounters:  11/29/12 138/82  09/12/12 120/72  08/02/12 120/62   The current medical regimen is generally effective;  continue present plan and medications.

## 2012-11-29 NOTE — Assessment & Plan Note (Signed)
Constipation prone - on Miralax, stool softner  Also chronic bloat and gas, unchanged symptoms  Trial amitiza 04/2011 ineffective recommended gas-x or enzymatic supplement in addition to probiotic daily -

## 2013-01-07 LAB — HEMOGLOBIN A1C: Hgb A1c MFr Bld: 5.8 % (ref 4.0–6.0)

## 2013-01-15 ENCOUNTER — Other Ambulatory Visit: Payer: Self-pay | Admitting: Internal Medicine

## 2013-01-28 ENCOUNTER — Encounter: Payer: Self-pay | Admitting: Internal Medicine

## 2013-02-07 ENCOUNTER — Encounter: Payer: Self-pay | Admitting: Internal Medicine

## 2013-02-19 ENCOUNTER — Other Ambulatory Visit: Payer: Self-pay | Admitting: Internal Medicine

## 2013-02-19 NOTE — Telephone Encounter (Signed)
F[facility]ed [facility] to [facility].../lmb 

## 2013-03-05 ENCOUNTER — Other Ambulatory Visit: Payer: Self-pay | Admitting: Internal Medicine

## 2013-03-05 NOTE — Telephone Encounter (Signed)
[facility]...lmb 

## 2013-03-13 ENCOUNTER — Ambulatory Visit (INDEPENDENT_AMBULATORY_CARE_PROVIDER_SITE_OTHER): Payer: 59 | Admitting: Internal Medicine

## 2013-03-13 ENCOUNTER — Encounter: Payer: Self-pay | Admitting: Internal Medicine

## 2013-03-13 VITALS — BP 132/86 | HR 67 | Temp 99.1°F | Ht 62.0 in | Wt 167.5 lb

## 2013-03-13 DIAGNOSIS — J309 Allergic rhinitis, unspecified: Secondary | ICD-10-CM

## 2013-03-13 DIAGNOSIS — H9121 Sudden idiopathic hearing loss, right ear: Secondary | ICD-10-CM

## 2013-03-13 DIAGNOSIS — E1149 Type 2 diabetes mellitus with other diabetic neurological complication: Secondary | ICD-10-CM

## 2013-03-13 DIAGNOSIS — J019 Acute sinusitis, unspecified: Secondary | ICD-10-CM

## 2013-03-13 DIAGNOSIS — H9120 Sudden idiopathic hearing loss, unspecified ear: Secondary | ICD-10-CM

## 2013-03-13 DIAGNOSIS — I679 Cerebrovascular disease, unspecified: Secondary | ICD-10-CM

## 2013-03-13 HISTORY — DX: Allergic rhinitis, unspecified: J30.9

## 2013-03-13 HISTORY — DX: Cerebrovascular disease, unspecified: I67.9

## 2013-03-13 MED ORDER — FLUTICASONE PROPIONATE 50 MCG/ACT NA SUSP: 2.0000 | Freq: Every day | NASAL | Status: DC

## 2013-03-13 MED ORDER — METHYLPREDNISOLONE ACETATE 80 MG/ML IJ SUSP
80.0000 mg | Freq: Once | INTRAMUSCULAR | Status: AC
  Administered 2013-03-13: 80 mg via INTRAMUSCULAR

## 2013-03-13 MED ORDER — AZITHROMYCIN 250 MG PO TABS: ORAL_TABLET | ORAL | Status: DC

## 2013-03-13 MED ORDER — GLIPIZIDE ER 5 MG PO TB24: 5.0000 mg | ORAL_TABLET | Freq: Every day | ORAL | Status: DC

## 2013-03-13 MED ORDER — NEOMYCIN-POLYMYXIN-HC 1 % OT SOLN: 3.0000 [drp] | Freq: Three times a day (TID) | OTIC | Status: DC

## 2013-03-13 NOTE — Progress Notes (Signed)
Subjective:    Patient ID: [name], female    DOB: [birth year], 71 y.o.   MRN: [id number]  HPI  Here to f/u, pt of Dr Felicity Coyer, with known right canal wax impaction that could not be removed by the hearing office yesterday, went there to have a hearing aid repaired;  Has occas right side HA with dizziness and wonders if related to the wax impaction;  Mentions her listing of "DM with neurological manifestations" and wonders if related to her total body tingling.  CBG's has been slightly elev to the 140's, surprising to her though she admits to some dietary noncompliance recently, has been trying to walk more as well recently.   Not aware of any elevation of temp, Denies urinary symptoms such as dysuria, frequency, urgency, flank pain, hematuria or n/v, fever, chills.  Does have several wks ongoing nasal allergy symptoms with clearish congestion, itch and sneezing, without fever, pain, ST, cough, swelling or wheezing, not taking the flonase lately. Past Medical History  Diagnosis Date  . Diabetes mellitus, type 2   . GERD (gastroesophageal reflux disease)   . Depression   . Seizure disorder   . Dyslipidemia   . OSA on CPAP   . Osteoarthritis of shoulder region     and Knee  . Anxiety   . Diverticulosis of colon 03/2010 hosp  . Hypertension   . Cerebrovascular disease, unspecified 03/13/2013    Atrophy and small vessel dz noted, MR brain 2009   Past Surgical History  Procedure Laterality Date  . Cholecystectomy    . Abdominal hysterectomy  1970's    Partial  . Appendectomy    . Tonsillectomy and adenoidectomy    . Shoulder surgery  2008    LT, post fall     reports that she quit smoking about 27 years ago. She does not have any smokeless tobacco history on file. Her alcohol and drug histories are not on file. family history includes Arthritis in her mother and other; Diabetes in her other; Heart disease in her father. Allergies  Allergen Reactions  . Penicillins   . Statins     Current Outpatient Prescriptions on File Prior to Visit  Medication Sig Dispense Refill  . albuterol (PROAIR HFA) 108 (90 BASE) MCG/ACT inhaler Inhale 2 puffs into the lungs every 6 (six) hours as needed.        . Alpha-D-Galactosidase (BEANO) TABS Take 1 tablet by mouth 3 (three) times daily before meals.    0  . ALPRAZolam (XANAX) 0.25 MG tablet Take 0.25 mg by mouth 2 (two) times daily as needed.      Marland Kitchen aspirin 81 MG tablet Take 81 mg by mouth daily.        . Biotin 5000 MCG TABS Take 5,000 mcg by mouth daily.  30 tablet    . Black Cohosh 80 MG CAPS Take 1 capsule (80 mg total) by mouth 2 (two) times daily.      . Blood Glucose Monitoring Suppl (ACCU-CHEK AVIVA PLUS) W/DEVICE KIT 1 kit by Does not apply route once.  1 kit  0  . Calcium Carbonate (CALCIUM 500 PO) Take 1 capsule by mouth daily.        . Cholecalciferol (EQL VITAMIN D3) 1000 UNITS tablet Take 1,000 Units by mouth daily as needed.       . ciprofloxacin (CIPRO) 500 MG tablet Take 1 tablet (500 mg total) by mouth 2 (two) times daily.  14 tablet  0  . Cyanocobalamin (  VITAMIN B 12) 100 MCG LOZG Take by mouth daily as needed.        . cyclobenzaprine (FLEXERIL) 5 MG tablet TAKE 1 TABLET (5 MG TOTAL) BY MOUTH EVERY 8 (EIGHT) HOURS AS NEEDED FOR MUSCLE SPASMS.  30 tablet  1  . famotidine (PEPCID) 20 MG tablet TAKE 1 TABLET BY MOUTH EVERY DAY  30 tablet  5  . fluticasone (FLONASE) 50 MCG/ACT nasal spray 2 sprays by Nasal route daily.        Marland Kitchen gabapentin (NEURONTIN) 300 MG capsule Take 1 capsule (300 mg total) by mouth 3 (three) times daily.  90 capsule  5  . glucose blood (ACCU-CHEK AVIVA) test strip Use 1 strip to check blood sugar twice a day Dx; 250.00  180 each  3  . hydrochlorothiazide (HYDRODIURIL) 12.5 MG tablet Take 1 tablet by mouth once a day as needed for edema  90 tablet  0  . HYDROcodone-acetaminophen (NORCO) 5-325 MG per tablet Take 0.5-1 tablets by mouth every 8 (eight) hours as needed for pain.  60 tablet  0  .  KLOR-CON M20 20 MEQ tablet TAKE 1 TABLET BY MOUTH EVERY DAY AS NEEDED WITH HYDROCHLOROTHIAZIDE FOR EDEMA  30 tablet  5  . losartan (COZAAR) 50 MG tablet Take 0.5 tablets (25 mg total) by mouth daily.  30 tablet  5  . metFORMIN (GLUCOPHAGE-XR) 500 MG 24 hr tablet Take 1 tablet (500 mg total) by mouth daily with breakfast.  30 tablet  11  . Multiple Vitamin (MULTIVITAMIN) capsule Take 1 capsule by mouth daily.        . OXcarbazepine (TRILEPTAL) 150 MG tablet TAKE 1&1/2 TABLET BY MOUTH TWICE DAILY  90 tablet  5  . oxyCODONE-acetaminophen (PERCOCET) 10-325 MG per tablet Take 1 tablet by mouth every 8 (eight) hours as needed for pain.  20 tablet  0  . Probiotic Product (PROBIOTIC PO) Take by mouth.        . promethazine-codeine (PHENERGAN WITH CODEINE) 6.25-10 MG/5ML syrup TAKE 1 TEASPOONFUL BY MOUTH EVERY 4 HOURS AS NEEDED FOR COUGH  180 mL  0  . traMADol (ULTRAM) 50 MG tablet TAKE 1 TABLET (50 MG TOTAL) BY MOUTH EVERY 6 (SIX) HOURS AS NEEDED FOR PAIN.  30 tablet  0  . azelastine (ASTELIN) 137 MCG/SPRAY nasal spray Place 2 sprays into the nose 2 (two) times daily. Use in each nostril as directed  30 mL  2   No current facility-administered medications on file prior to visit.   Review of Systems  Constitutional: Negative for unexpected weight change, or unusual diaphoresis  HENT: Negative for tinnitus.   Eyes: Negative for photophobia and visual disturbance.  Respiratory: Negative for choking and stridor.   Gastrointestinal: Negative for vomiting and blood in stool.  Genitourinary: Negative for hematuria and decreased urine volume.  Musculoskeletal: Negative for acute joint swelling Skin: Negative for color change and wound.  Neurological: Negative for tremors and numbness other than noted  Psychiatric/Behavioral: Negative for decreased concentration or  hyperactivity.       Objective:   Physical Exam BP 132/86  Pulse 67  Temp(Src) 99.1 F (37.3 C) (Oral)  Ht 5\' 2"  (1.575 m)  Wt 167 lb 8  oz (75.978 kg)  BMI 30.63 kg/m2  SpO2 99% VS noted, mild ill Constitutional: Pt appears well-developed and well-nourished.  HENT: Head: NCAT.  Right Ear: External ear normal.  Left Ear: External ear normal. Right canal with wax impaction removed with irrigation, hearing improved, and mild  eyrthema residual noted/mild swelling Bilat tm's with mild erythema.  Max sinus areas mild tender.  Pharynx with mild erythema, no exudate Eyes: Conjunctivae and EOM are normal. Pupils are equal, round, and reactive to light.  Neck: Normal range of motion. Neck supple.  Cardiovascular: Normal rate and regular rhythm.   Pulmonary/Chest: Effort normal and breath sounds normal.  Abd:  Soft, NT, non-distended, + BS Neurological: Pt is alert. Not confused  Skin: Skin is warm. No erythema.  Psychiatric: Pt behavior is normal. Thought content normal.         Assessment & Plan:

## 2013-03-13 NOTE — Assessment & Plan Note (Signed)
Early mild, for zpack x 1

## 2013-03-13 NOTE — Patient Instructions (Addendum)
You had the steroid shot today OK to stop the "regular" glipizide at 5 mg per day Please take all new medication as prescribed - the glipizide ER 5 mg per day (to help the sugars better and reduce risk of low sugar during the day) Your ears were irrigated of wax today Please follow up with the hearing aid office for the hearing aid repair Please take all new medication as prescribed - the ear drops for the right ear canal, as well as the pill antibiotics for the early sinus infection Please continue all other medications as before, including re-starting the generic Flonase for the allergies Please have the pharmacy call with any other refills you may need.  Please remember to sign up for My Chart if you have not done so, as this will be important to you in the future with finding out test results, communicating by private email, and scheduling acute appointments online when needed.

## 2013-03-13 NOTE — Assessment & Plan Note (Signed)
Ok to change the glipizide to 5 mg ER version to lower risk of hypoglycemia

## 2013-03-13 NOTE — Assessment & Plan Note (Signed)
Mild to mod, for depomedrol IM, re-start flonase,  to f/u any worsening symptoms or concerns

## 2013-03-13 NOTE — Assessment & Plan Note (Addendum)
Acute on chronic, hearing aid in the shop, but needs wax impaction irrigated - done today, also for cortisporin topical with possilbe underlying ext otitis

## 2013-03-28 ENCOUNTER — Other Ambulatory Visit: Payer: Self-pay | Admitting: Internal Medicine

## 2013-03-28 NOTE — Telephone Encounter (Signed)
Faxed hardcopy to [facility] [address] [place] 

## 2013-03-28 NOTE — Telephone Encounter (Signed)
Done hardcopy to [facility]  

## 2013-03-28 NOTE — Telephone Encounter (Signed)
MD out of office pls advise on refill.../lmb 

## 2013-03-29 ENCOUNTER — Encounter: Payer: Self-pay | Admitting: Internal Medicine

## 2013-04-04 ENCOUNTER — Other Ambulatory Visit: Payer: Self-pay | Admitting: Internal Medicine

## 2013-04-22 ENCOUNTER — Other Ambulatory Visit: Payer: Self-pay | Admitting: Internal Medicine

## 2013-05-08 ENCOUNTER — Ambulatory Visit (INDEPENDENT_AMBULATORY_CARE_PROVIDER_SITE_OTHER): Payer: 59

## 2013-05-08 ENCOUNTER — Ambulatory Visit (INDEPENDENT_AMBULATORY_CARE_PROVIDER_SITE_OTHER): Payer: 59 | Admitting: Internal Medicine

## 2013-05-08 ENCOUNTER — Encounter: Payer: Self-pay | Admitting: Internal Medicine

## 2013-05-08 VITALS — BP 130/80 | HR 79 | Temp 98.6°F | Wt 165.1 lb

## 2013-05-08 DIAGNOSIS — R209 Unspecified disturbances of skin sensation: Secondary | ICD-10-CM

## 2013-05-08 DIAGNOSIS — E1149 Type 2 diabetes mellitus with other diabetic neurological complication: Secondary | ICD-10-CM

## 2013-05-08 DIAGNOSIS — K589 Irritable bowel syndrome without diarrhea: Secondary | ICD-10-CM

## 2013-05-08 DIAGNOSIS — M1990 Unspecified osteoarthritis, unspecified site: Secondary | ICD-10-CM

## 2013-05-08 DIAGNOSIS — R200 Anesthesia of skin: Secondary | ICD-10-CM

## 2013-05-08 MED ORDER — CYCLOBENZAPRINE HCL 5 MG PO TABS: 5.0000 mg | ORAL_TABLET | Freq: Three times a day (TID) | ORAL | Status: DC | PRN

## 2013-05-08 NOTE — Assessment & Plan Note (Signed)
Metformin + prn glipizide -  On ASA, ARB and declines statin associated with peripheral neuropathy - started gabapentin 01/2012 for same, will titrate as needed Follows with optho and podiatry q 6-83mo  Lab Results  Component Value Date   HGBA1C 5.8 01/07/2013

## 2013-05-08 NOTE — Assessment & Plan Note (Signed)
Continue norco prn for pain Little help with tramadol as rx'd 07/2012 GI issues make NSAIDs unattractive option

## 2013-05-08 NOTE — Assessment & Plan Note (Signed)
Constipation prone - on Miralax, stool softner  Also chronic bloat and gas, unchanged symptoms  Trial amitiza 04/2011 ineffective recommended gas-x or enzymatic supplement in addition to probiotic daily - Also keep food diary to consider lactose sensitivity

## 2013-05-08 NOTE — Progress Notes (Signed)
Pre-visit discussion using our clinic review tool. No additional management support is needed unless otherwise documented below in the visit note.  

## 2013-05-08 NOTE — Patient Instructions (Signed)
It was good to see you today.  We have reviewed your prior records including labs and tests today  Test(s) ordered today. Your results will be released to MyChart (or called to you) after review, usually within 72hours after test completion. If any changes need to be made, you will be notified at that same time.  take gabapentin 3 times/day every day increase MiraLAX to twice daily  Keep food Journal to check for problem foods -take by mouth before every meal and consider Lactaid if needed for dairy digestion  Please schedule followup in 3 months for diabetes mellitus check, call sooner if problems.

## 2013-05-08 NOTE — Progress Notes (Signed)
  Subjective:    Patient ID: [name], female    DOB: [birth year], 72 y.o.   MRN: [id number]  HPI Here for follow up - reviewed chronic medical issues and interval events  Past Medical History  Diagnosis Date  . Diabetes mellitus, type 2   . GERD (gastroesophageal reflux disease)   . Depression   . Seizure disorder   . Dyslipidemia   . OSA on CPAP   . Osteoarthritis of shoulder region     and Knee  . Anxiety   . Diverticulosis of colon 03/2010 hosp  . Hypertension   . Cerebrovascular disease, unspecified 03/13/2013    Atrophy and small vessel dz noted, MR brain 2009  . Allergic rhinitis, cause unspecified 03/13/2013    Review of Systems  Constitutional: Negative for unexpected weight change.  Cardiovascular: Negative for chest pain and palpitations.  Gastrointestinal: Positive for abdominal distention ("gas" chronic).  Musculoskeletal: Positive for arthritis. Negative for joint swelling.  Neurological: Positive for numbness. Negative for weakness.       Objective:   Physical ExamBP 130/80  Pulse 79  Temp(Src) 98.6 F (37 C) (Oral)  Wt 165 lb 1.9 oz (74.898 kg)  SpO2 97% Wt Readings from Last 3 Encounters:  05/08/13 165 lb 1.9 oz (74.898 kg)  03/13/13 167 lb 8 oz (75.978 kg)  11/29/12 169 lb (76.658 kg)   Constitutional: She is overweight; but appears well-developed and well-nourished. No distress.  Neck: Supple, full range of motion. No anterior fullness. No thyroid nodule or enlargement. No JVD or LAD Cardiovascular: Normal rate, regular rhythm and normal heart sounds.  No murmur heard.  No BLE edema Pulmonary/Chest: Effort normal and breath sounds normal. No respiratory distress. She has no wheezes.  Lab Results  Component Value Date   WBC 6.3 09/12/2012   HGB 12.9 09/12/2012   HCT 38.2 09/12/2012   PLT 309.0 09/12/2012   CHOL 213* 11/03/2011   TRIG 155.0* 11/03/2011   HDL 65.80 11/03/2011   LDLDIRECT 132.7 11/03/2011   ALT 19 09/12/2012   AST 31 09/12/2012   NA  134* 09/12/2012   K 4.5 09/12/2012   CL 94* 09/12/2012   CREATININE 0.8 09/12/2012   BUN 10 09/12/2012   CO2 30 09/12/2012   TSH 1.60 02/08/2012   INR 1.8* 04/08/2008   HGBA1C 5.8 01/07/2013   MICROALBUR 1.0 10/07/2009   Lab Results  Component Value Date   VITAMINB12 962* 02/08/2012       Assessment & Plan:   See problem list. Medications and labs reviewed today.

## 2013-05-09 LAB — LIPID PANEL
Total CHOL/HDL Ratio: 3
Triglycerides: 181 mg/dL — ABNORMAL HIGH (ref 0.0–149.0)
VLDL: 36.2 mg/dL (ref 0.0–40.0)

## 2013-05-09 LAB — LDL CHOLESTEROL, DIRECT: Direct LDL: 148 mg/dL

## 2013-05-14 ENCOUNTER — Other Ambulatory Visit: Payer: Self-pay | Admitting: Internal Medicine

## 2013-05-22 ENCOUNTER — Other Ambulatory Visit: Payer: Self-pay | Admitting: Internal Medicine

## 2013-05-22 DIAGNOSIS — Z1231 Encounter for screening mammogram for malignant neoplasm of breast: Secondary | ICD-10-CM

## 2013-06-13 ENCOUNTER — Ambulatory Visit (HOSPITAL_COMMUNITY)
Admission: RE | Admit: 2013-06-13 | Discharge: 2013-06-13 | Disposition: A | Discharge status: Home / Self Care | Payer: Medicare Other | Source: Ambulatory Visit | Attending: Internal Medicine | Admitting: Internal Medicine

## 2013-06-13 DIAGNOSIS — Z1231 Encounter for screening mammogram for malignant neoplasm of breast: Secondary | ICD-10-CM | POA: Insufficient documentation

## 2013-07-09 ENCOUNTER — Other Ambulatory Visit: Payer: Self-pay | Admitting: Internal Medicine

## 2013-07-15 ENCOUNTER — Other Ambulatory Visit: Payer: Self-pay | Admitting: Internal Medicine

## 2013-07-24 ENCOUNTER — Telehealth: Payer: Self-pay | Admitting: Internal Medicine

## 2013-07-24 NOTE — Telephone Encounter (Signed)
Pt called stated that [facility] is not going to cover for Cyclobenzaprine 5mg . Pt is not sure what Dr. Asa Lente wants to do because if pt need to continue to take this medication, doctor office will have to submit PA to get it cover. Please advise.

## 2013-07-24 NOTE — Telephone Encounter (Signed)
Ask pt to ask insurance for formulary substitutions for cyclobenzaprine that we can try - I will then change the med thanks

## 2013-07-25 NOTE — Telephone Encounter (Signed)
Called pt no answer LMOM with md response.../lmb 

## 2013-07-29 ENCOUNTER — Other Ambulatory Visit: Payer: Self-pay | Admitting: Internal Medicine

## 2013-07-30 ENCOUNTER — Telehealth: Payer: Self-pay | Admitting: *Deleted

## 2013-07-30 NOTE — Telephone Encounter (Signed)
Called pt back she states [facility] will be facing md a list on what the alternatives for flexeril. Inform pt once we received & md choose which one that will replace flexeril will call her back with info...[name]

## 2013-07-30 NOTE — Telephone Encounter (Signed)
Patient left message requesting [name] return her call about "a med ya'll talked about".  CB# [phone]

## 2013-07-31 ENCOUNTER — Other Ambulatory Visit: Payer: Self-pay | Admitting: *Deleted

## 2013-07-31 NOTE — Telephone Encounter (Signed)
Received PA from [facility] pt needing PA on her cyclobenzaprine. Completed PA fax back waiting on approval status.../lmb.

## 2013-07-31 NOTE — Telephone Encounter (Signed)
Called pt and gave her update on PA...Johny Chess

## 2013-08-01 NOTE — Telephone Encounter (Signed)
Notified pt received PA back from [facility] med was approved so they will be mailing medication to her...[name]

## 2013-08-12 ENCOUNTER — Other Ambulatory Visit: Payer: Self-pay | Admitting: *Deleted

## 2013-08-12 MED ORDER — POTASSIUM CHLORIDE CRYS ER 20 MEQ PO TBCR: EXTENDED_RELEASE_TABLET | ORAL | Status: DC

## 2013-08-12 MED ORDER — METFORMIN HCL ER 500 MG PO TB24: 500.0000 mg | ORAL_TABLET | Freq: Every day | ORAL | Status: DC

## 2013-08-12 MED ORDER — OXCARBAZEPINE 150 MG PO TABS: ORAL_TABLET | ORAL | Status: DC

## 2013-08-12 MED ORDER — LOSARTAN POTASSIUM 50 MG PO TABS: ORAL_TABLET | ORAL | Status: DC

## 2013-08-14 ENCOUNTER — Other Ambulatory Visit: Payer: Self-pay

## 2013-08-14 MED ORDER — FAMOTIDINE 20 MG PO TABS: 20.0000 mg | ORAL_TABLET | Freq: Every day | ORAL | Status: DC

## 2013-08-14 NOTE — Telephone Encounter (Signed)
Last filled 10/14 with 5 refills--Last OV 05/08/13--please advise

## 2013-08-29 ENCOUNTER — Other Ambulatory Visit: Payer: Self-pay | Admitting: Internal Medicine

## 2013-09-09 ENCOUNTER — Encounter: Payer: Self-pay | Admitting: Physician Assistant

## 2013-09-09 ENCOUNTER — Ambulatory Visit (INDEPENDENT_AMBULATORY_CARE_PROVIDER_SITE_OTHER): Payer: 59 | Admitting: Physician Assistant

## 2013-09-09 VITALS — BP 112/78 | HR 80 | Temp 99.7°F

## 2013-09-09 DIAGNOSIS — R142 Eructation: Secondary | ICD-10-CM

## 2013-09-09 DIAGNOSIS — R141 Gas pain: Secondary | ICD-10-CM

## 2013-09-09 DIAGNOSIS — R143 Flatulence: Secondary | ICD-10-CM

## 2013-09-09 DIAGNOSIS — A499 Bacterial infection, unspecified: Secondary | ICD-10-CM

## 2013-09-09 DIAGNOSIS — J329 Chronic sinusitis, unspecified: Secondary | ICD-10-CM

## 2013-09-09 DIAGNOSIS — B9689 Other specified bacterial agents as the cause of diseases classified elsewhere: Secondary | ICD-10-CM

## 2013-09-09 MED ORDER — LEVOFLOXACIN 500 MG PO TABS: 500.0000 mg | ORAL_TABLET | Freq: Every day | ORAL | Status: DC

## 2013-09-09 NOTE — Progress Notes (Signed)
Pre visit review using our clinic review tool, if applicable. No additional management support is needed unless otherwise documented below in the visit note. 

## 2013-09-09 NOTE — Progress Notes (Signed)
    Patient ID: [name] is a 73 y.o. female DOB: [birth year] MRN: [id number]  Subjective:    HPI  complains of head cold symptoms, ?sinusitus Onset >1 week ago, initially improved then relapsing and worse symptoms  First associated with rhinorrhea, sneezing, sore throat, mild headache and low grade fever Now sinus pressure and mild-mod nasal congestion, yellow-green discharge, dry cough No relief with OTC meds Precipitated by sick contacts and weather change  Also reports complaint of gas, has noted increased flatulence in last couple weeks.  Uses probiotic daily before breakfast and famotidine prior to dinner Has not taken Beano in some time, states she is uncertain when to take.   Past Medical History  Diagnosis Date  . Diabetes mellitus, type 2   . GERD (gastroesophageal reflux disease)   . Depression   . Seizure disorder   . Dyslipidemia   . OSA on CPAP   . Osteoarthritis of shoulder region     and Knee  . Anxiety   . Diverticulosis of colon 03/2010 hosp  . Hypertension   . Cerebrovascular disease, unspecified 03/13/2013    Atrophy and small vessel dz noted, MR brain 2009  . Allergic rhinitis, cause unspecified 03/13/2013    Review of Systems Constitutional: No night sweats,no change in appetite, no unexpected weight change HEENT: no pain/difficulty swallowing Pulmonary: No wheezing or hemoptysis Cardiovascular: No chest pain or palpitations Abdomen: no change in bowel habits. No N/V Musculoskeletal: no myalgia      Objective:   Physical Exam  BP 112/78  Pulse 80  Temp(Src) 99.7 F (37.6 C) (Oral)  SpO2 97%  GEN: mildly ill appearing and audible head/chest congestion HENT: NCAT, mild sinus tenderness bilaterally R>L, nares with thick discharge and turbinate swelling, oropharynx mild erythema and PND, no exudate Eyes: Vision grossly intact, no conjunctivitis Lungs: Clear to auscultation without rhonchi or wheeze, no increased work of  breathing Cardiovascular: Regular rate and rhythm, no bilateral edema Abdomen: appearance normal, normal bowel sounds in all four quadrants, non tender.   Lab Results  Component Value Date   WBC 6.3 09/12/2012   HGB 12.9 09/12/2012   HCT 38.2 09/12/2012   PLT 309.0 09/12/2012   GLUCOSE 91 09/12/2012   CHOL 241* 05/08/2013   TRIG 181.0* 05/08/2013   HDL 72.30 05/08/2013   LDLDIRECT 148.0 05/08/2013   LDLCALC 105 01/07/2013   ALT 19 09/12/2012   AST 31 09/12/2012   NA 134* 09/12/2012   K 4.5 09/12/2012   CL 94* 09/12/2012   CREATININE 0.8 09/12/2012   BUN 10 09/12/2012   CO2 30 09/12/2012   TSH 1.79 05/08/2013   INR 1.8* 04/08/2008   HGBA1C 6.4 05/08/2013   MICROALBUR 1.0 10/07/2009      Assessment & Plan:  Viral URI > progression to acute sinusitis Cough, postnasal drip related to above   Empiric antibiotics prescribed due to symptom duration greater than 7 days and progression despite OTC symptomatic care Symptomatic care with Tylenol or Advil, hydration and rest -  Throat lozenges or hard candy recommended. Saline irrigation and salt gargle advised as needed  Flatulence:  Recommend continued use of the Beano when eating a meal that is high in fiber, or carbohydrates. Also when eating foods that are known to cause gas like broccoli, cabbage, beans, artificially sweetened foods, sugar free gums etc.  RTO if no improvement of symptoms.

## 2013-09-09 NOTE — Patient Instructions (Addendum)
It was great to meet you today Ms. Becraft!  I have sent a prescription to your pharmacy for an antibiotic. Please take as directed.   For the gas symptoms I recommend you continue use of the Beano when eating a meal that is high in fiber, or carbohydrates. Also when eating foods that are known to cause gas like broccoli, cabbage, beans, artificially sweetened foods, sugar free gums etc.     Sinusitis Sinusitis is redness, soreness, and puffiness (inflammation) of the air pockets in the bones of your face (sinuses). The redness, soreness, and puffiness can cause air and mucus to get trapped in your sinuses. This can allow germs to grow and cause an infection.  HOME CARE   Drink enough fluids to keep your pee (urine) clear or pale yellow.  Use a humidifier in your home.  Run a hot shower to create steam in the bathroom. Sit in the bathroom with the door closed. Breathe in the steam 3 4 times a day.  Put a warm, moist washcloth on your face 3 4 times a day, or as told by your doctor.  Use salt water sprays (saline sprays) to wet the thick fluid in your nose. This can help the sinuses drain.  Only take medicine as told by your doctor. GET HELP RIGHT AWAY IF:   Your pain gets worse.  You have very bad headaches.  You are sick to your stomach (nauseous).  You throw up (vomit).  You are very sleepy (drowsy) all the time.  Your face is puffy (swollen).  Your vision changes.  You have a stiff neck.  You have trouble breathing. MAKE SURE YOU:   Understand these instructions.  Will watch your condition.  Will get help right away if you are not doing well or get worse. Document Released: 11/30/2007 Document Revised: 03/07/2012 Document Reviewed: 01/17/2012 [employer] Patient Information 2014 [place].

## 2013-09-10 ENCOUNTER — Other Ambulatory Visit: Payer: Self-pay | Admitting: Internal Medicine

## 2013-09-10 NOTE — Telephone Encounter (Signed)
Not to fill at this time, if cough persists, schedule follow up appointment with Dr. Asa Lente.

## 2013-09-16 ENCOUNTER — Other Ambulatory Visit: Payer: Self-pay | Admitting: *Deleted

## 2013-09-16 MED ORDER — PROMETHAZINE-CODEINE 6.25-10 MG/5ML PO SYRP: 5.0000 mL | ORAL_SOLUTION | ORAL | Status: AC | PRN

## 2013-09-16 NOTE — Telephone Encounter (Signed)
F[facility]ed [facility] to [facility].../lmb 

## 2013-09-30 ENCOUNTER — Other Ambulatory Visit: Payer: Self-pay | Admitting: Internal Medicine

## 2013-10-02 ENCOUNTER — Telehealth: Payer: Self-pay | Admitting: *Deleted

## 2013-10-02 ENCOUNTER — Encounter: Payer: Self-pay | Admitting: *Deleted

## 2013-10-02 MED ORDER — HYDROCODONE-ACETAMINOPHEN 5-325 MG PO TABS: 0.5000 | ORAL_TABLET | Freq: Three times a day (TID) | ORAL | Status: DC | PRN

## 2013-10-02 NOTE — Telephone Encounter (Signed)
Please call home # [phone].

## 2013-10-02 NOTE — Telephone Encounter (Signed)
Pt came into the clinic requesting Hydrocodone refill.  Last refill 2.6.2014. Last OV 3.16.2015.  Please advise

## 2013-10-02 NOTE — Telephone Encounter (Signed)
Ok - note need for screening with [facility] prior to picking up refill (if not already done)  

## 2013-10-02 NOTE — Telephone Encounter (Signed)
Spoke with pts husband advised Rx ready for pick up

## 2013-10-25 ENCOUNTER — Encounter: Payer: Self-pay | Admitting: Internal Medicine

## 2013-12-02 ENCOUNTER — Other Ambulatory Visit: Payer: Self-pay | Admitting: Internal Medicine

## 2013-12-05 ENCOUNTER — Other Ambulatory Visit: Payer: Self-pay | Admitting: *Deleted

## 2013-12-05 MED ORDER — GABAPENTIN 300 MG PO CAPS: 300.0000 mg | ORAL_CAPSULE | Freq: Three times a day (TID) | ORAL | Status: DC

## 2013-12-05 MED ORDER — LOSARTAN POTASSIUM 50 MG PO TABS: ORAL_TABLET | ORAL | Status: DC

## 2013-12-05 MED ORDER — OXCARBAZEPINE 150 MG PO TABS: ORAL_TABLET | ORAL | Status: DC

## 2013-12-05 MED ORDER — METFORMIN HCL ER 500 MG PO TB24: 500.0000 mg | ORAL_TABLET | Freq: Every day | ORAL | Status: DC

## 2013-12-05 MED ORDER — POTASSIUM CHLORIDE CRYS ER 20 MEQ PO TBCR: EXTENDED_RELEASE_TABLET | ORAL | Status: DC

## 2013-12-05 MED ORDER — FAMOTIDINE 20 MG PO TABS: 20.0000 mg | ORAL_TABLET | Freq: Every day | ORAL | Status: DC

## 2013-12-24 ENCOUNTER — Ambulatory Visit (INDEPENDENT_AMBULATORY_CARE_PROVIDER_SITE_OTHER): Payer: 59 | Admitting: Internal Medicine

## 2013-12-24 ENCOUNTER — Other Ambulatory Visit (INDEPENDENT_AMBULATORY_CARE_PROVIDER_SITE_OTHER): Payer: 59

## 2013-12-24 ENCOUNTER — Encounter: Payer: Self-pay | Admitting: Internal Medicine

## 2013-12-24 VITALS — BP 126/78 | HR 68 | Temp 97.4°F | Ht 62.0 in | Wt 173.2 lb

## 2013-12-24 DIAGNOSIS — G589 Mononeuropathy, unspecified: Secondary | ICD-10-CM

## 2013-12-24 DIAGNOSIS — E871 Hypo-osmolality and hyponatremia: Secondary | ICD-10-CM | POA: Insufficient documentation

## 2013-12-24 DIAGNOSIS — G629 Polyneuropathy, unspecified: Secondary | ICD-10-CM

## 2013-12-24 DIAGNOSIS — R42 Dizziness and giddiness: Secondary | ICD-10-CM

## 2013-12-24 DIAGNOSIS — E1149 Type 2 diabetes mellitus with other diabetic neurological complication: Secondary | ICD-10-CM

## 2013-12-24 LAB — BASIC METABOLIC PANEL
BUN: 10 mg/dL (ref 6–23)
CO2: 32 meq/L (ref 19–32)
CREATININE: 0.7 mg/dL (ref 0.4–1.2)
Calcium: 10 mg/dL (ref 8.4–10.5)
Chloride: 94 mEq/L — ABNORMAL LOW (ref 96–112)
GFR: 104.36 mL/min (ref >60.00)
GLUCOSE: 128 mg/dL — AB (ref 70–99)
Potassium: 3.5 mEq/L (ref 3.5–5.1)
Sodium: 133 mEq/L — ABNORMAL LOW (ref 135–145)

## 2013-12-24 LAB — CBC WITH DIFFERENTIAL/PLATELET
BASOS PCT: 0.5 % (ref 0.0–3.0)
Basophils Absolute: 0 10*3/uL (ref 0.0–0.1)
EOS ABS: 0.1 10*3/uL (ref 0.0–0.7)
Eosinophils Relative: 1.1 % (ref 0.0–5.0)
HEMATOCRIT: 39.5 % (ref 36.0–46.0)
Hemoglobin: 13.5 g/dL (ref 12.0–15.0)
Lymphocytes Relative: 40.9 % (ref 12.0–46.0)
Lymphs Abs: 2.9 10*3/uL (ref 0.7–4.0)
MCHC: 34.3 g/dL (ref 30.0–36.0)
MCV: 90.1 fl (ref 78.0–100.0)
MONO ABS: 0.6 10*3/uL (ref 0.1–1.0)
Monocytes Relative: 8.7 % (ref 3.0–12.0)
NEUTROS PCT: 48.8 % (ref 43.0–77.0)
Neutro Abs: 3.5 10*3/uL (ref 1.4–7.7)
PLATELETS: 286 10*3/uL (ref 150.0–400.0)
RBC: 4.38 Mil/uL (ref 3.87–5.11)
RDW: 13.4 % (ref 11.5–15.5)
WBC: 7.1 10*3/uL (ref 4.0–10.5)

## 2013-12-24 LAB — VITAMIN B12: VITAMIN B 12: 532 pg/mL (ref 211–911)

## 2013-12-24 LAB — TSH: TSH: 2.13 u[IU]/mL (ref 0.35–4.50)

## 2013-12-24 LAB — HEPATIC FUNCTION PANEL
ALT: 21 U/L (ref 0–35)
AST: 33 U/L (ref 0–37)
Albumin: 4.6 g/dL (ref 3.5–5.2)
Alkaline Phosphatase: 60 U/L (ref 39–117)
Bilirubin, Direct: 0.1 mg/dL (ref 0.0–0.3)
Total Bilirubin: 0.3 mg/dL (ref 0.2–1.2)
Total Protein: 8.1 g/dL (ref 6.0–8.3)

## 2013-12-24 LAB — HEMOGLOBIN A1C: Hgb A1c MFr Bld: 6.7 % — ABNORMAL HIGH (ref 4.6–6.5)

## 2013-12-24 MED ORDER — GABAPENTIN 600 MG PO TABS: 600.0000 mg | ORAL_TABLET | Freq: Three times a day (TID) | ORAL | Status: DC

## 2013-12-24 MED ORDER — MECLIZINE HCL 25 MG PO TABS: 25.0000 mg | ORAL_TABLET | Freq: Three times a day (TID) | ORAL | Status: DC | PRN

## 2013-12-24 NOTE — Progress Notes (Signed)
Pre visit review using our clinic review tool, if applicable. No additional management support is needed unless otherwise documented below in the visit note. 

## 2013-12-24 NOTE — Assessment & Plan Note (Signed)
chronic and recurrent symptoms No neuro deficits on exam rx meclizine - reassuance provided, pt will call if worse or unimproved

## 2013-12-24 NOTE — Patient Instructions (Signed)
It was good to see you today.  We have reviewed your prior records including labs and tests today  Test(s) ordered today. Your results will be released to [place] (or called to you) after review, usually within 72hours after test completion. If any changes need to be made, you will be notified at that same time.  Medications reviewed and updated Increase gabapentin to 600 mg 3 times daily ( for neuropathy, sent to mail order pharmacy) Begin meclizine 25 mg 3 times daily as needed for dizziness/vertigo - sent to local pharmacy No other changes recommended at this time.  Please schedule followup in 3-4 months, call sooner if problems.

## 2013-12-24 NOTE — Assessment & Plan Note (Addendum)
Metformin + prn glipizide -  On ASA, ARB and declines statin associated with peripheral neuropathy - started gabapentin 01/2012 for same, will titrate up now and as needed Follows with optho and podiatry q 6-21mo  Lab Results  Component Value Date   HGBA1C 6.4 05/08/2013

## 2013-12-24 NOTE — Assessment & Plan Note (Signed)
Mild, chronic without significant progression Given dizziness, stop HCTZ and recheck Bmet next OV

## 2013-12-24 NOTE — Addendum Note (Signed)
Addended by: Gwendolyn Grant A on: 12/24/2013 09:16 PM   Modules accepted: Orders, Medications

## 2013-12-24 NOTE — Progress Notes (Signed)
Subjective:    Patient ID: [name], female    DOB: [birth year], 73 y.o.   MRN: [id number]  Diabetes Hypoglycemia symptoms include dizziness (chronic, low level "spinning"). Pertinent negatives for hypoglycemia include no headaches or speech difficulty. Associated symptoms include fatigue. Pertinent negatives for diabetes include no chest pain and no weakness.  Dizziness Associated symptoms include arthralgias (inner left ankle, no injury recalled), fatigue and numbness (hands, feet and occ lips/lower face). Pertinent negatives include no chest pain, coughing, fever, headaches, joint swelling or weakness.    Patient is here for follow up  Reviewed chronic medical issues and interval medical events - see CC  Past Medical History  Diagnosis Date  . Diabetes mellitus, type 2   . GERD (gastroesophageal reflux disease)   . Depression   . Seizure disorder   . Dyslipidemia   . OSA on CPAP   . Osteoarthritis of shoulder region     and Knee  . Anxiety   . Diverticulosis of colon 03/2010 hosp  . Hypertension   . Cerebrovascular disease, unspecified 03/13/2013    Atrophy and small vessel dz noted, MR brain 2009  . Allergic rhinitis, cause unspecified 03/13/2013    Review of Systems  Constitutional: Positive for fatigue. Negative for fever and unexpected weight change.  HENT: Positive for sinus pressure. Negative for ear discharge and ear pain.   Respiratory: Negative for cough and shortness of breath.   Cardiovascular: Negative for chest pain and leg swelling.  Musculoskeletal: Positive for arthralgias (inner left ankle, no injury recalled). Negative for gait problem and joint swelling.  Neurological: Positive for dizziness (chronic, low level "spinning") and numbness (hands, feet and occ lips/lower face). Negative for syncope, facial asymmetry, speech difficulty, weakness, light-headedness and headaches.       Objective:   Physical Exam  BP 126/78  Pulse 68  Temp(Src) 97.4 F  (36.3 C) (Oral)  Ht 5\' 2"  (1.575 m)  Wt 173 lb 4 oz (78.586 kg)  BMI 31.68 kg/m2  SpO2 97% Wt Readings from Last 3 Encounters:  12/24/13 173 lb 4 oz (78.586 kg)  05/08/13 165 lb 1.9 oz (74.898 kg)  03/13/13 167 lb 8 oz (75.978 kg)   Constitutional: She is obese, but appears well-developed and well-nourished. No distress.  HENT: TMs B clear without effusion or erythema. History of impaction Neck: Normal range of motion. Neck supple. No JVD present. No thyromegaly present.  Cardiovascular: Normal rate, regular rhythm and normal heart sounds.  No murmur heard. No BLE edema. Pulmonary/Chest: Effort normal and breath sounds normal. No respiratory distress. She has no wheezes.  Neurologic: awake, alert, oriented x4. Cranial nerves II through XII symmetrically -no nystagmus. Speech fluent, recall normal. Balance and gait normal. Motor strength grossly intact throughout Psychiatric: She has a normal mood and affect. Her behavior is normal. Judgment and thought content normal.   Lab Results  Component Value Date   WBC 6.3 09/12/2012   HGB 12.9 09/12/2012   HCT 38.2 09/12/2012   PLT 309.0 09/12/2012   GLUCOSE 91 09/12/2012   CHOL 241* 05/08/2013   TRIG 181.0* 05/08/2013   HDL 72.30 05/08/2013   LDLDIRECT 148.0 05/08/2013   LDLCALC 105 01/07/2013   ALT 19 09/12/2012   AST 31 09/12/2012   NA 134* 09/12/2012   K 4.5 09/12/2012   CL 94* 09/12/2012   CREATININE 0.8 09/12/2012   BUN 10 09/12/2012   CO2 30 09/12/2012   TSH 1.79 05/08/2013   INR 1.8* 04/08/2008  HGBA1C 6.4 05/08/2013   MICROALBUR 1.0 10/07/2009    Mm Digital Screening  06/14/2013   CLINICAL DATA:  Screening.  EXAM: DIGITAL SCREENING BILATERAL MAMMOGRAM WITH CAD  COMPARISON:  Previous exam(s).  ACR Breast Density Category b: There are scattered areas of fibroglandular density.  FINDINGS: There are no findings suspicious for malignancy. Images were processed with CAD.  IMPRESSION: No mammographic evidence of malignancy. A result letter  of this screening mammogram will be mailed directly to the patient.  RECOMMENDATION: Screening mammogram in one year. (Code:SM-B-01Y)  BI-RADS CATEGORY  1: Negative   Electronically Signed   By: Everlean Alstrom M.D.   On: 06/14/2013 16:47       Assessment & Plan:   Tingling and generalized "neuropathy"bilateral hands, feet and lower face. Check metabolic labs. No diabetic neuropathy so we'll titrate up gabapentin for same.  Problem List Items Addressed This Visit   Type II or unspecified type diabetes mellitus with neurological manifestations, not stated as uncontrolled(250.60) - Primary      Metformin + prn glipizide -  On ASA, ARB and declines statin associated with peripheral neuropathy - started gabapentin 01/2012 for same, will titrate up now and as needed Follows with optho and podiatry q 6-65mo  Lab Results  Component Value Date   HGBA1C 6.4 05/08/2013      Relevant Orders      Hemoglobin A1c   VERTIGO     chronic and recurrent symptoms No neuro deficits on exam rx meclizine - reassuance provided, pt will call if worse or unimproved    Relevant Medications      meclizine (ANTIVERT) tablet   Other Relevant Orders      Basic metabolic panel      CBC with Differential      TSH      Hepatic function panel      Vitamin B12    Other Visit Diagnoses   Neuropathy        Relevant Orders       Hemoglobin A1c       Basic metabolic panel       CBC with Differential       TSH       Hepatic function panel       Vitamin B12

## 2013-12-25 ENCOUNTER — Telehealth: Payer: Self-pay | Admitting: Certified Registered Nurse Anesthetist

## 2013-12-25 NOTE — Telephone Encounter (Signed)
Second attempt to reach patient both home and cell phone.

## 2013-12-25 NOTE — Telephone Encounter (Signed)
Message copied by Oletta Lamas on Wed Dec 25, 2013 11:47 AM ------      Message from: Rowe Clack      Created: Tue Dec 24, 2013  9:11 PM       Please call patient:      Labs show  Diabetes is still overall controlled (a1c <7), so no diabetes mellitus med changes recommended at this time despite slight upward trend      Mildly low sodium - please stop HCTZ now and call if BP >140 or increase in edema      Other labs ok - no other new changes recommended. Please let me know if there are questions or problems. Thanks ------

## 2013-12-25 NOTE — Telephone Encounter (Signed)
Pt return called pt gave her md response concerning labs...Johny Chess

## 2013-12-25 NOTE — Telephone Encounter (Signed)
Left message with [name] to call us back here in the office

## 2014-01-22 ENCOUNTER — Other Ambulatory Visit (INDEPENDENT_AMBULATORY_CARE_PROVIDER_SITE_OTHER): Payer: 59

## 2014-01-22 ENCOUNTER — Ambulatory Visit (INDEPENDENT_AMBULATORY_CARE_PROVIDER_SITE_OTHER): Payer: 59 | Admitting: Internal Medicine

## 2014-01-22 ENCOUNTER — Encounter: Payer: Self-pay | Admitting: Internal Medicine

## 2014-01-22 VITALS — BP 108/68 | HR 68 | Temp 98.5°F | Ht 62.0 in | Wt 177.0 lb

## 2014-01-22 DIAGNOSIS — M1990 Unspecified osteoarthritis, unspecified site: Secondary | ICD-10-CM

## 2014-01-22 DIAGNOSIS — E1149 Type 2 diabetes mellitus with other diabetic neurological complication: Secondary | ICD-10-CM

## 2014-01-22 DIAGNOSIS — E871 Hypo-osmolality and hyponatremia: Secondary | ICD-10-CM

## 2014-01-22 DIAGNOSIS — I10 Essential (primary) hypertension: Secondary | ICD-10-CM

## 2014-01-22 DIAGNOSIS — F341 Dysthymic disorder: Secondary | ICD-10-CM

## 2014-01-22 LAB — BASIC METABOLIC PANEL
BUN: 12 mg/dL (ref 6–23)
CALCIUM: 10 mg/dL (ref 8.4–10.5)
CO2: 33 mEq/L — ABNORMAL HIGH (ref 19–32)
CREATININE: 0.8 mg/dL (ref 0.4–1.2)
Chloride: 99 mEq/L (ref 96–112)
GFR: 95.01 mL/min (ref >60.00)
Glucose, Bld: 110 mg/dL — ABNORMAL HIGH (ref 70–99)
Potassium: 3.8 mEq/L (ref 3.5–5.1)
SODIUM: 137 meq/L (ref 135–145)

## 2014-01-22 MED ORDER — HYDROCODONE-ACETAMINOPHEN 5-325 MG PO TABS: 0.5000 | ORAL_TABLET | Freq: Three times a day (TID) | ORAL | Status: DC | PRN

## 2014-01-22 MED ORDER — PAROXETINE HCL 10 MG PO TABS: 10.0000 mg | ORAL_TABLET | Freq: Every day | ORAL | Status: DC

## 2014-01-22 NOTE — Assessment & Plan Note (Signed)
Continue norco prn for pain try tramadol - erx done GI issues make NSAIDs unattractive option

## 2014-01-22 NOTE — Assessment & Plan Note (Signed)
Mild, chronic without significant progression Because of dizziness symptoms, stopped HCTZ late 11/2013 recheck Bmet now

## 2014-01-22 NOTE — Assessment & Plan Note (Signed)
Suspect serotonin deficiency contributing to chronic fatigue symptoms Discussed same with patient if sodium has improved, will start SSRI for treatment of same - we reviewed potential risk/benefit and possible side effects - pt understands and agrees to same

## 2014-01-22 NOTE — Patient Instructions (Addendum)
It was good to see you today.  We have reviewed your prior records including labs and tests today  Test(s) ordered today. Your results will be released to [place] (or called to you) after review, usually within 72hours after test completion. If any changes need to be made, you will be notified at that same time.  Medications reviewed and updated, no changes recommended at this time. Refill on medication(s) as discussed today. IF labs ok, will start "antidepressant" as discussed  Please schedule followup in 4 months for diabetes mellitus and cholesterol check, call sooner if problems.

## 2014-01-22 NOTE — Assessment & Plan Note (Signed)
Metformin + prn glipizide -  On ASA, ARB and declines statin associated with peripheral neuropathy - started gabapentin 01/2012 for same, titrate up 12/24/13 and improved - continue same, titrate as needed Follows with optho and podiatry q 6-46mo  Lab Results  Component Value Date   HGBA1C 6.7* 12/24/2013

## 2014-01-22 NOTE — Progress Notes (Signed)
Subjective:    Patient ID: [name], female    DOB: 31-Mar-1941, 73 y.o.   MRN: [id number]  HPI  Patient is here for follow up  Reviewed chronic medical issues and interval medical events  Past Medical History  Diagnosis Date  . Diabetes mellitus, type 2   . GERD (gastroesophageal reflux disease)   . Depression   . Seizure disorder   . Dyslipidemia   . OSA on CPAP   . Osteoarthritis of shoulder region     and Knee  . Anxiety   . Diverticulosis of colon 03/2010 hosp  . Hypertension   . Cerebrovascular disease, unspecified 03/13/2013    Atrophy and small vessel dz noted, MR brain 2009  . Allergic rhinitis, cause unspecified 03/13/2013    Review of Systems  Constitutional: Positive for fatigue (emotional and physical >3 mo). Negative for fever and unexpected weight change.  Respiratory: Negative for cough and shortness of breath.   Neurological: Negative for weakness.  Psychiatric/Behavioral: Positive for dysphoric mood. Negative for suicidal ideas and self-injury. The patient is not nervous/anxious.        Objective:   Physical Exam  BP 108/68  Pulse 68  Temp(Src) 98.5 F (36.9 C) (Oral)  Ht 5\' 2"  (1.575 m)  Wt 177 lb (80.287 kg)  BMI 32.37 kg/m2  SpO2 97% Wt Readings from Last 3 Encounters:  01/22/14 177 lb (80.287 kg)  12/24/13 173 lb 4 oz (78.586 kg)  05/08/13 165 lb 1.9 oz (74.898 kg)    Constitutional: She appears well-developed and well-nourished. No distress.  Neck: Normal range of motion. Neck supple. No JVD present. No thyromegaly present.  Cardiovascular: Normal rate, regular rhythm and normal heart sounds.  No murmur heard. No BLE edema. Pulmonary/Chest: Effort normal and breath sounds normal. No respiratory distress. She has no wheezes.  Psychiatric: She has a normal mood and affect. Her behavior is normal. Judgment and thought content normal.   Lab Results  Component Value Date   WBC 7.1 12/24/2013   HGB 13.5 12/24/2013   HCT 39.5 12/24/2013     PLT 286.0 12/24/2013   GLUCOSE 128* 12/24/2013   CHOL 241* 05/08/2013   TRIG 181.0* 05/08/2013   HDL 72.30 05/08/2013   LDLDIRECT 148.0 05/08/2013   LDLCALC 105 01/07/2013   ALT 21 12/24/2013   AST 33 12/24/2013   NA 133* 12/24/2013   K 3.5 12/24/2013   CL 94* 12/24/2013   CREATININE 0.7 12/24/2013   BUN 10 12/24/2013   CO2 32 12/24/2013   TSH 2.13 12/24/2013   INR 1.8* 04/08/2008   HGBA1C 6.7* 12/24/2013   MICROALBUR 1.0 10/07/2009    Mm Digital Screening  06/14/2013   CLINICAL DATA:  Screening.  EXAM: DIGITAL SCREENING BILATERAL MAMMOGRAM WITH CAD  COMPARISON:  Previous exam(s).  ACR Breast Density Category b: There are scattered areas of fibroglandular density.  FINDINGS: There are no findings suspicious for malignancy. Images were processed with CAD.  IMPRESSION: No mammographic evidence of malignancy. A result letter of this screening mammogram will be mailed directly to the patient.  RECOMMENDATION: Screening mammogram in one year. (Code:SM-B-01Y)  BI-RADS CATEGORY  1: Negative   Electronically Signed   By: Everlean Alstrom M.D.   On: 06/14/2013 16:47       Assessment & Plan:   Problem List Items Addressed This Visit   Dysthymia     Suspect serotonin deficiency contributing to chronic fatigue symptoms Discussed same with patient if sodium has improved, will start  SSRI for treatment of same - we reviewed potential risk/benefit and possible side effects - pt understands and agrees to same      HYPERTENSION      BP Readings from Last 3 Encounters:  01/22/14 108/68  12/24/13 126/78  09/09/13 112/78   The current medical regimen is effective;  continue present plan and medications.     Hyponatremia     Mild, chronic without significant progression Because of dizziness symptoms, stopped HCTZ late 11/2013 recheck Bmet now    Relevant Orders      Basic metabolic panel   OSTEOARTHRITIS     Continue norco prn for pain try tramadol - erx done GI issues make NSAIDs unattractive  option    Relevant Medications      HYDROcodone-acetaminophen (NORCO) 5-325 MG per tablet   Type II or unspecified type diabetes mellitus with neurological manifestations, not stated as uncontrolled(250.60) - Primary      Metformin + prn glipizide -  On ASA, ARB and declines statin associated with peripheral neuropathy - started gabapentin 01/2012 for same, titrate up 12/24/13 and improved - continue same, titrate as needed Follows with optho and podiatry q 6-62mo  Lab Results  Component Value Date   HGBA1C 6.7* 12/24/2013      Relevant Orders      Basic metabolic panel

## 2014-01-22 NOTE — Progress Notes (Signed)
Pre visit review using our clinic review tool, if applicable. No additional management support is needed unless otherwise documented below in the visit note. 

## 2014-01-22 NOTE — Addendum Note (Signed)
Addended by: Gwendolyn Grant A on: 01/22/2014 05:50 PM   Modules accepted: Orders

## 2014-01-22 NOTE — Assessment & Plan Note (Signed)
BP Readings from Last 3 Encounters:  01/22/14 108/68  12/24/13 126/78  09/09/13 112/78   The current medical regimen is effective;  continue present plan and medications.

## 2014-01-27 ENCOUNTER — Other Ambulatory Visit: Payer: Self-pay | Admitting: Internal Medicine

## 2014-03-07 ENCOUNTER — Other Ambulatory Visit: Payer: Self-pay | Admitting: Internal Medicine

## 2014-03-23 ENCOUNTER — Telehealth: Payer: Self-pay | Admitting: Physician Assistant

## 2014-03-24 NOTE — Telephone Encounter (Signed)
Left msg on triage wanting to get a refill on her cough syrup. Called pt back inform her md is out this afternoon will give her a call bck tomorrow once she responds...Johny Chess

## 2014-03-24 NOTE — Telephone Encounter (Signed)
Ok to print and i will sign for refill thanks

## 2014-03-25 MED ORDER — PROMETHAZINE-CODEINE 6.25-10 MG/5ML PO SYRP: ORAL_SOLUTION | ORAL | Status: DC

## 2014-03-25 NOTE — Telephone Encounter (Signed)
Had to reprint system was down. Notified pt ready for pick-up.Marland KitchenJohny Chess

## 2014-04-09 ENCOUNTER — Telehealth: Payer: Self-pay

## 2014-04-09 DIAGNOSIS — E119 Type 2 diabetes mellitus without complications: Secondary | ICD-10-CM

## 2014-04-09 NOTE — Telephone Encounter (Signed)
Pt stated that she has not had an eye exam within the last 2 years.  She is currently in need of a referral.  She has [employer] and is concerned that she may have to pay out of pocket.

## 2014-04-21 ENCOUNTER — Other Ambulatory Visit: Payer: Self-pay | Admitting: Internal Medicine

## 2014-05-12 ENCOUNTER — Emergency Department (HOSPITAL_COMMUNITY): Payer: Medicare Other

## 2014-05-12 ENCOUNTER — Inpatient Hospital Stay (HOSPITAL_COMMUNITY)
Admission: EM | Admit: 2014-05-12 | Discharge: 2014-05-16 | DRG: 392 | Disposition: A | Discharge status: Home / Self Care | Payer: Medicare Other | Attending: Internal Medicine | Admitting: Internal Medicine

## 2014-05-12 ENCOUNTER — Encounter (HOSPITAL_COMMUNITY): Payer: Self-pay | Admitting: Emergency Medicine

## 2014-05-12 DIAGNOSIS — R935 Abnormal findings on diagnostic imaging of other abdominal regions, including retroperitoneum: Secondary | ICD-10-CM

## 2014-05-12 DIAGNOSIS — M19019 Primary osteoarthritis, unspecified shoulder: Secondary | ICD-10-CM | POA: Diagnosis present

## 2014-05-12 DIAGNOSIS — R109 Unspecified abdominal pain: Secondary | ICD-10-CM

## 2014-05-12 DIAGNOSIS — E1140 Type 2 diabetes mellitus with diabetic neuropathy, unspecified: Secondary | ICD-10-CM | POA: Diagnosis present

## 2014-05-12 DIAGNOSIS — R1032 Left lower quadrant pain: Secondary | ICD-10-CM | POA: Diagnosis not present

## 2014-05-12 DIAGNOSIS — K760 Fatty (change of) liver, not elsewhere classified: Secondary | ICD-10-CM | POA: Diagnosis present

## 2014-05-12 DIAGNOSIS — K5900 Constipation, unspecified: Secondary | ICD-10-CM | POA: Diagnosis present

## 2014-05-12 DIAGNOSIS — IMO0002 Reserved for concepts with insufficient information to code with codable children: Secondary | ICD-10-CM | POA: Insufficient documentation

## 2014-05-12 DIAGNOSIS — I10 Essential (primary) hypertension: Secondary | ICD-10-CM | POA: Diagnosis present

## 2014-05-12 DIAGNOSIS — Z880 Allergy status to penicillin: Secondary | ICD-10-CM | POA: Diagnosis not present

## 2014-05-12 DIAGNOSIS — D3500 Benign neoplasm of unspecified adrenal gland: Secondary | ICD-10-CM | POA: Diagnosis present

## 2014-05-12 DIAGNOSIS — G4733 Obstructive sleep apnea (adult) (pediatric): Secondary | ICD-10-CM | POA: Diagnosis present

## 2014-05-12 DIAGNOSIS — Z7951 Long term (current) use of inhaled steroids: Secondary | ICD-10-CM | POA: Diagnosis not present

## 2014-05-12 DIAGNOSIS — Z87891 Personal history of nicotine dependence: Secondary | ICD-10-CM | POA: Diagnosis not present

## 2014-05-12 DIAGNOSIS — K5732 Diverticulitis of large intestine without perforation or abscess without bleeding: Secondary | ICD-10-CM

## 2014-05-12 DIAGNOSIS — Z79899 Other long term (current) drug therapy: Secondary | ICD-10-CM | POA: Diagnosis not present

## 2014-05-12 DIAGNOSIS — I959 Hypotension, unspecified: Secondary | ICD-10-CM | POA: Diagnosis present

## 2014-05-12 DIAGNOSIS — E669 Obesity, unspecified: Secondary | ICD-10-CM | POA: Diagnosis present

## 2014-05-12 DIAGNOSIS — E119 Type 2 diabetes mellitus without complications: Secondary | ICD-10-CM | POA: Insufficient documentation

## 2014-05-12 DIAGNOSIS — Z6832 Body mass index (BMI) 32.0-32.9, adult: Secondary | ICD-10-CM

## 2014-05-12 DIAGNOSIS — E785 Hyperlipidemia, unspecified: Secondary | ICD-10-CM | POA: Diagnosis present

## 2014-05-12 DIAGNOSIS — F419 Anxiety disorder, unspecified: Secondary | ICD-10-CM | POA: Diagnosis present

## 2014-05-12 DIAGNOSIS — F329 Major depressive disorder, single episode, unspecified: Secondary | ICD-10-CM | POA: Diagnosis present

## 2014-05-12 DIAGNOSIS — R52 Pain, unspecified: Secondary | ICD-10-CM

## 2014-05-12 DIAGNOSIS — G40909 Epilepsy, unspecified, not intractable, without status epilepticus: Secondary | ICD-10-CM

## 2014-05-12 DIAGNOSIS — Z7982 Long term (current) use of aspirin: Secondary | ICD-10-CM | POA: Diagnosis not present

## 2014-05-12 DIAGNOSIS — K5792 Diverticulitis of intestine, part unspecified, without perforation or abscess without bleeding: Secondary | ICD-10-CM

## 2014-05-12 DIAGNOSIS — K219 Gastro-esophageal reflux disease without esophagitis: Secondary | ICD-10-CM | POA: Diagnosis present

## 2014-05-12 DIAGNOSIS — I679 Cerebrovascular disease, unspecified: Secondary | ICD-10-CM | POA: Diagnosis present

## 2014-05-12 DIAGNOSIS — Z9989 Dependence on other enabling machines and devices: Secondary | ICD-10-CM

## 2014-05-12 DIAGNOSIS — F32A Depression, unspecified: Secondary | ICD-10-CM | POA: Insufficient documentation

## 2014-05-12 HISTORY — DX: Diverticulitis of large intestine without perforation or abscess without bleeding: K57.32

## 2014-05-12 HISTORY — DX: Type 2 diabetes mellitus with diabetic neuropathy, unspecified: E11.40

## 2014-05-12 HISTORY — DX: Type 2 diabetes mellitus without complications: E11.9

## 2014-05-12 HISTORY — DX: Fatty (change of) liver, not elsewhere classified: K76.0

## 2014-05-12 LAB — URINALYSIS, ROUTINE W REFLEX MICROSCOPIC
BILIRUBIN URINE: NEGATIVE
Glucose, UA: NEGATIVE mg/dL
Hgb urine dipstick: NEGATIVE
Ketones, ur: NEGATIVE mg/dL
Leukocytes, UA: NEGATIVE
NITRITE: NEGATIVE
PROTEIN: NEGATIVE mg/dL
Specific Gravity, Urine: 1.033 — ABNORMAL HIGH (ref 1.005–1.030)
UROBILINOGEN UA: 0.2 mg/dL (ref 0.0–1.0)
pH: 6 (ref 5.0–8.0)

## 2014-05-12 LAB — COMPREHENSIVE METABOLIC PANEL
ALT: 16 U/L (ref 0–35)
ANION GAP: 14 (ref 5–15)
AST: 19 U/L (ref 0–37)
Albumin: 3.8 g/dL (ref 3.5–5.2)
Alkaline Phosphatase: 80 U/L (ref 39–117)
BILIRUBIN TOTAL: 0.2 mg/dL — AB (ref 0.3–1.2)
BUN: 7 mg/dL (ref 6–23)
CO2: 25 mEq/L (ref 19–32)
Calcium: 10.1 mg/dL (ref 8.4–10.5)
Chloride: 98 mEq/L (ref 96–112)
Creatinine, Ser: 0.65 mg/dL (ref 0.50–1.10)
GFR calc non Af Amer: 87 mL/min — ABNORMAL LOW (ref >90)
GLUCOSE: 140 mg/dL — AB (ref 70–99)
Potassium: 4 mEq/L (ref 3.7–5.3)
Sodium: 137 mEq/L (ref 137–147)
Total Protein: 8 g/dL (ref 6.0–8.3)

## 2014-05-12 LAB — CBC
HEMATOCRIT: 37.3 % (ref 36.0–46.0)
HEMOGLOBIN: 12.4 g/dL (ref 12.0–15.0)
MCH: 29.7 pg (ref 26.0–34.0)
MCHC: 33.2 g/dL (ref 30.0–36.0)
MCV: 89.4 fL (ref 78.0–100.0)
Platelets: 251 10*3/uL (ref 150–400)
RBC: 4.17 MIL/uL (ref 3.87–5.11)
RDW: 14 % (ref 11.5–15.5)
WBC: 10.3 10*3/uL (ref 4.0–10.5)

## 2014-05-12 LAB — CBC WITH DIFFERENTIAL/PLATELET
BASOS PCT: 0 % (ref 0–1)
Basophils Absolute: 0 10*3/uL (ref 0.0–0.1)
Eosinophils Absolute: 0.1 10*3/uL (ref 0.0–0.7)
Eosinophils Relative: 1 % (ref 0–5)
HEMATOCRIT: 38.5 % (ref 36.0–46.0)
Hemoglobin: 13.2 g/dL (ref 12.0–15.0)
LYMPHS ABS: 2.1 10*3/uL (ref 0.7–4.0)
Lymphocytes Relative: 19 % (ref 12–46)
MCH: 31.3 pg (ref 26.0–34.0)
MCHC: 34.3 g/dL (ref 30.0–36.0)
MCV: 91.2 fL (ref 78.0–100.0)
MONO ABS: 0.8 10*3/uL (ref 0.1–1.0)
MONOS PCT: 7 % (ref 3–12)
NEUTROS ABS: 8.1 10*3/uL — AB (ref 1.7–7.7)
Neutrophils Relative %: 73 % (ref 43–77)
Platelets: 267 10*3/uL (ref 150–400)
RBC: 4.22 MIL/uL (ref 3.87–5.11)
RDW: 13.8 % (ref 11.5–15.5)
WBC: 11.1 10*3/uL — ABNORMAL HIGH (ref 4.0–10.5)

## 2014-05-12 LAB — GLUCOSE, CAPILLARY
GLUCOSE-CAPILLARY: 124 mg/dL — AB (ref 70–99)
Glucose-Capillary: 118 mg/dL — ABNORMAL HIGH (ref 70–99)

## 2014-05-12 LAB — LIPASE, BLOOD: Lipase: 35 U/L (ref 11–59)

## 2014-05-12 LAB — CREATININE, SERUM
CREATININE: 0.64 mg/dL (ref 0.50–1.10)
GFR calc Af Amer: >90 mL/min (ref >90)
GFR, EST NON AFRICAN AMERICAN: 88 mL/min — AB (ref >90)

## 2014-05-12 MED ORDER — MORPHINE SULFATE 2 MG/ML IJ SOLN
2.0000 mg | INTRAMUSCULAR | Status: DC | PRN
  Administered 2014-05-13 – 2014-05-14 (×4): 2 mg via INTRAVENOUS
  Filled 2014-05-12 (×5): qty 1

## 2014-05-12 MED ORDER — IOHEXOL 300 MG/ML  SOLN
25.0000 mL | Freq: Once | INTRAMUSCULAR | Status: AC | PRN
  Administered 2014-05-12: 25 mL via ORAL

## 2014-05-12 MED ORDER — FENTANYL CITRATE 0.05 MG/ML IJ SOLN
50.0000 ug | Freq: Once | INTRAMUSCULAR | Status: AC
  Administered 2014-05-12: 50 ug via INTRAVENOUS
  Filled 2014-05-12: qty 2

## 2014-05-12 MED ORDER — SODIUM CHLORIDE 0.9 % IV BOLUS (SEPSIS)
500.0000 mL | Freq: Once | INTRAVENOUS | Status: AC
  Administered 2014-05-12: 500 mL via INTRAVENOUS

## 2014-05-12 MED ORDER — ONDANSETRON HCL 4 MG/2ML IJ SOLN
4.0000 mg | Freq: Once | INTRAMUSCULAR | Status: AC
Start: 2014-05-12 — End: 2014-05-12
  Administered 2014-05-12: 4 mg via INTRAVENOUS
  Filled 2014-05-12: qty 2

## 2014-05-12 MED ORDER — ONDANSETRON HCL 4 MG PO TABS: 4.0000 mg | ORAL_TABLET | Freq: Four times a day (QID) | ORAL | Status: DC | PRN

## 2014-05-12 MED ORDER — ONDANSETRON HCL 4 MG/2ML IJ SOLN
4.0000 mg | Freq: Three times a day (TID) | INTRAMUSCULAR | Status: DC | PRN
  Filled 2014-05-12: qty 2

## 2014-05-12 MED ORDER — SODIUM CHLORIDE 0.9 % IV BOLUS (SEPSIS)
1000.0000 mL | Freq: Once | INTRAVENOUS | Status: AC
  Administered 2014-05-12: 1000 mL via INTRAVENOUS

## 2014-05-12 MED ORDER — IOHEXOL 300 MG/ML  SOLN
100.0000 mL | Freq: Once | INTRAMUSCULAR | Status: AC | PRN
  Administered 2014-05-12: 100 mL via INTRAVENOUS

## 2014-05-12 MED ORDER — OXCARBAZEPINE 150 MG PO TABS
225.0000 mg | ORAL_TABLET | Freq: Two times a day (BID) | ORAL | Status: DC
  Administered 2014-05-12 – 2014-05-16 (×8): 225 mg via ORAL
  Filled 2014-05-12 (×10): qty 1.5

## 2014-05-12 MED ORDER — HYDROMORPHONE HCL 1 MG/ML IJ SOLN
1.0000 mg | Freq: Once | INTRAMUSCULAR | Status: AC
  Administered 2014-05-12: 1 mg via INTRAVENOUS
  Filled 2014-05-12: qty 1

## 2014-05-12 MED ORDER — ALBUTEROL SULFATE (2.5 MG/3ML) 0.083% IN NEBU: 2.5000 mg | INHALATION_SOLUTION | Freq: Four times a day (QID) | RESPIRATORY_TRACT | Status: DC | PRN

## 2014-05-12 MED ORDER — HYDROMORPHONE HCL 1 MG/ML IJ SOLN
0.5000 mg | Freq: Once | INTRAMUSCULAR | Status: AC
  Administered 2014-05-12: 0.5 mg via INTRAVENOUS
  Filled 2014-05-12: qty 1

## 2014-05-12 MED ORDER — ONDANSETRON HCL 4 MG/2ML IJ SOLN
4.0000 mg | Freq: Once | INTRAMUSCULAR | Status: AC
  Administered 2014-05-12: 4 mg via INTRAVENOUS
  Filled 2014-05-12: qty 2

## 2014-05-12 MED ORDER — INSULIN ASPART 100 UNIT/ML ~~LOC~~ SOLN
0.0000 [IU] | SUBCUTANEOUS | Status: DC
  Administered 2014-05-12: 1 [IU] via SUBCUTANEOUS
  Administered 2014-05-13: 2 [IU] via SUBCUTANEOUS

## 2014-05-12 MED ORDER — ALBUTEROL SULFATE HFA 108 (90 BASE) MCG/ACT IN AERS: 2.0000 | INHALATION_SPRAY | Freq: Four times a day (QID) | RESPIRATORY_TRACT | Status: DC | PRN

## 2014-05-12 MED ORDER — METRONIDAZOLE IN NACL 5-0.79 MG/ML-% IV SOLN
500.0000 mg | Freq: Once | INTRAVENOUS | Status: AC
  Administered 2014-05-12: 500 mg via INTRAVENOUS
  Filled 2014-05-12: qty 100

## 2014-05-12 MED ORDER — CIPROFLOXACIN IN D5W 400 MG/200ML IV SOLN
400.0000 mg | Freq: Once | INTRAVENOUS | Status: AC
  Administered 2014-05-12: 400 mg via INTRAVENOUS
  Filled 2014-05-12: qty 200

## 2014-05-12 MED ORDER — CIPROFLOXACIN IN D5W 400 MG/200ML IV SOLN
400.0000 mg | Freq: Two times a day (BID) | INTRAVENOUS | Status: DC
  Administered 2014-05-12 – 2014-05-14 (×5): 400 mg via INTRAVENOUS
  Filled 2014-05-12 (×7): qty 200

## 2014-05-12 MED ORDER — METRONIDAZOLE IN NACL 5-0.79 MG/ML-% IV SOLN
500.0000 mg | Freq: Three times a day (TID) | INTRAVENOUS | Status: DC
  Administered 2014-05-12 – 2014-05-15 (×8): 500 mg via INTRAVENOUS
  Filled 2014-05-12 (×11): qty 100

## 2014-05-12 MED ORDER — SODIUM CHLORIDE 0.9 % IV SOLN
INTRAVENOUS | Status: AC
  Administered 2014-05-12: 17:00:00 via INTRAVENOUS

## 2014-05-12 MED ORDER — SODIUM CHLORIDE 0.9 % IV SOLN
INTRAVENOUS | Status: AC
  Administered 2014-05-12: 125 mL/h via INTRAVENOUS

## 2014-05-12 MED ORDER — ONDANSETRON HCL 4 MG/2ML IJ SOLN
4.0000 mg | Freq: Four times a day (QID) | INTRAMUSCULAR | Status: DC | PRN
  Administered 2014-05-16: 4 mg via INTRAVENOUS
  Filled 2014-05-12: qty 2

## 2014-05-12 MED ORDER — PAROXETINE HCL 10 MG PO TABS
10.0000 mg | ORAL_TABLET | Freq: Every day | ORAL | Status: DC | PRN
  Filled 2014-05-12: qty 1

## 2014-05-12 MED ORDER — HYDROMORPHONE HCL 1 MG/ML IJ SOLN
1.0000 mg | INTRAMUSCULAR | Status: AC | PRN
  Administered 2014-05-12 (×3): 1 mg via INTRAVENOUS
  Filled 2014-05-12 (×3): qty 1

## 2014-05-12 MED ORDER — ENOXAPARIN SODIUM 40 MG/0.4ML ~~LOC~~ SOLN
40.0000 mg | SUBCUTANEOUS | Status: DC
  Administered 2014-05-14 – 2014-05-15 (×2): 40 mg via SUBCUTANEOUS
  Filled 2014-05-12 (×5): qty 0.4

## 2014-05-12 NOTE — ED Notes (Signed)
RETURNED FROM [facility]

## 2014-05-12 NOTE — ED Provider Notes (Signed)
CSN: [id number]     Arrival date & time 05/12/14  0747 History   First MD Initiated Contact with Patient 05/12/14 [id number]     Chief Complaint  Patient presents with  . Abdominal Pain     (Consider location/radiation/quality/duration/timing/severity/associated sxs/prior Treatment) Patient is a 73 y.o. female presenting with abdominal pain. The history is provided by the patient.  Abdominal Pain Associated symptoms: nausea   Associated symptoms: no chest pain, no chills, no diarrhea, no dysuria, no fever, no shortness of breath, no sore throat, no vaginal bleeding, no vaginal discharge and no vomiting   pt c/o left lower abdominal pain for the past 3-4 days. Pain constant, dull, moderate. Worse w palpation. Nausea. Has been eating. Had small bm today, no severe constipation. No abd distension or vomiting. No fever or chills. Hx colon diverticula. No dysuria or gu c/o. No back or flank pain. Prior abd surgery includes remote hx hysterectomy.      Past Medical History  Diagnosis Date  . Diabetes mellitus, type 2   . GERD (gastroesophageal reflux disease)   . Depression   . Seizure disorder   . Dyslipidemia   . OSA on CPAP   . Osteoarthritis of shoulder region     and Knee  . Anxiety   . Diverticulosis of colon 03/2010 hosp  . Hypertension   . Cerebrovascular disease, unspecified 03/13/2013    Atrophy and small vessel dz noted, MR brain 2009  . Allergic rhinitis, cause unspecified 03/13/2013   Past Surgical History  Procedure Laterality Date  . Cholecystectomy    . Abdominal hysterectomy  1970's    Partial  . Appendectomy    . Tonsillectomy and adenoidectomy    . Shoulder surgery  2008    LT, post fall    Family History  Problem Relation Age of Onset  . Arthritis Mother   . Heart disease Father   . Arthritis Other     Grandmother  . Diabetes Other     Grandmother   History  Substance Use Topics  . Smoking status: Former Smoker    Quit date: 10/19/1985  . Smokeless  tobacco: Not on file  . Alcohol Use: Not on file   OB History    No data available     Review of Systems  Constitutional: Negative for fever and chills.  HENT: Negative for sore throat.   Eyes: Negative for redness.  Respiratory: Negative for shortness of breath.   Cardiovascular: Negative for chest pain.  Gastrointestinal: Positive for nausea and abdominal pain. Negative for vomiting and diarrhea.  Endocrine: Negative for polyuria.  Genitourinary: Negative for dysuria, flank pain, vaginal bleeding and vaginal discharge.  Musculoskeletal: Negative for back pain and neck pain.  Skin: Negative for rash.  Neurological: Negative for headaches.  Hematological: Does not bruise/bleed easily.  Psychiatric/Behavioral: Negative for confusion.      Allergies  Penicillins and Statins  Home Medications   Prior to Admission medications   Medication Sig Start Date End Date Taking? Authorizing Provider  ACCU-CHEK AVIVA PLUS test strip Check blood sugar two times daily 08/29/13   Rowe Clack, MD  albuterol (PROAIR HFA) 108 (90 BASE) MCG/ACT inhaler Inhale 2 puffs into the lungs every 6 (six) hours as needed.      Historical Provider, MD  Alpha-D-Galactosidase Satira Mccallum) TABS Take 1 tablet by mouth 3 (three) times daily before meals. 05/02/12   Rowe Clack, MD  ALPRAZolam Duanne Moron) 0.25 MG tablet Take 0.25 mg by mouth 2 (  two) times daily as needed. 08/11/11   Rowe Clack, MD  aspirin 81 MG tablet Take 81 mg by mouth daily.      Historical Provider, MD  azelastine (ASTELIN) 137 MCG/SPRAY nasal spray Place 2 sprays into the nose 2 (two) times daily. Use in each nostril as directed 08/11/11 08/10/12  Rowe Clack, MD  Biotin 5000 MCG TABS Take 5,000 mcg by mouth daily. 11/29/12   Rowe Clack, MD  Black Cohosh 80 MG CAPS Take 1 capsule (80 mg total) by mouth 2 (two) times daily. 05/02/12   Rowe Clack, MD  Blood Glucose Monitoring Suppl (ACCU-CHEK AVIVA PLUS) W/DEVICE KIT  1 kit by Does not apply route once. 11/13/12   Rowe Clack, MD  Calcium Carbonate (CALCIUM 500 PO) Take 1 capsule by mouth daily.      Historical Provider, MD  Cholecalciferol (EQL VITAMIN D3) 1000 UNITS tablet Take 1,000 Units by mouth daily as needed.     Historical Provider, MD  Cyanocobalamin (VITAMIN B 12) 100 MCG LOZG Take by mouth daily as needed.      Historical Provider, MD  cyclobenzaprine (FLEXERIL) 5 MG tablet TAKE 1 TABLET (5 MG TOTAL) BY MOUTH 3 (THREE) TIMES DAILY AS NEEDED FOR MUSCLE SPASMS. 09/30/13   Rowe Clack, MD  famotidine (PEPCID) 20 MG tablet Take 1 tablet (20 mg total) by mouth daily. 12/05/13   Rowe Clack, MD  famotidine (PEPCID) 20 MG tablet TAKE 1 TABLET (20 MG TOTAL) BY MOUTH DAILY. 03/07/14   Rowe Clack, MD  fluticasone (FLONASE) 50 MCG/ACT nasal spray Place 2 sprays into the nose daily. 03/13/13   Biagio Borg, MD  gabapentin (NEURONTIN) 300 MG capsule TAKE 1 CAPSULE (300 MG TOTAL) BY MOUTH 3 (THREE) TIMES DAILY. 01/27/14   Rowe Clack, MD  gabapentin (NEURONTIN) 600 MG tablet Take 1 tablet (600 mg total) by mouth 3 (three) times daily. 12/24/13   Rowe Clack, MD  glipiZIDE (GLUCOTROL XL) 5 MG 24 hr tablet TAKE 1 TABLET (5 MG TOTAL) BY MOUTH DAILY.    Rowe Clack, MD  HYDROcodone-acetaminophen (NORCO) 5-325 MG per tablet Take 0.5-1 tablets by mouth every 8 (eight) hours as needed. 01/22/14   Rowe Clack, MD  losartan (COZAAR) 50 MG tablet TAKE 1 TABLET BY MOUTH EVERY DAY 12/05/13   Rowe Clack, MD  meclizine (ANTIVERT) 25 MG tablet Take 1 tablet (25 mg total) by mouth 3 (three) times daily as needed for dizziness or nausea. 12/24/13   Rowe Clack, MD  metFORMIN (GLUCOPHAGE-XR) 500 MG 24 hr tablet Take 1 tablet (500 mg total) by mouth daily with breakfast. 12/05/13   Rowe Clack, MD  Multiple Vitamin (MULTIVITAMIN) capsule Take 1 capsule by mouth daily.      Historical Provider, MD   NEOMYCIN-POLYMYXIN-HYDROCORTISONE (CORTISPORIN) 1 % SOLN otic solution Place 3 drops into the right ear every 8 (eight) hours. For 10 days 03/13/13   Biagio Borg, MD  OXcarbazepine (TRILEPTAL) 150 MG tablet TAKE 1&1/2 TABLET BY MOUTH TWICE DAILY 12/05/13   Rowe Clack, MD  OXcarbazepine (TRILEPTAL) 150 MG tablet TAKE 1 AND 1/2 TABLET BY MOUTH TWICE DAILY 04/21/14   Rowe Clack, MD  PARoxetine (PAXIL) 10 MG tablet Take 1 tablet (10 mg total) by mouth daily. 01/22/14   Rowe Clack, MD  potassium chloride SA (KLOR-CON M20) 20 MEQ tablet TAKE 1 TABLET BY MOUTH EVERY DAY AS NEEDED WITH HYDROCHLOROTHIAZIDE  FOR EDEMA 12/05/13   Rowe Clack, MD  Probiotic Product (PROBIOTIC PO) Take by mouth.      Historical Provider, MD  promethazine-codeine (PHENERGAN WITH CODEINE) 6.25-10 MG/5ML syrup TAKE 1 TEASPOON BY MOUTH EVERY 4 HOURS AS NEEDED FOR COUGH 03/25/14   Rowe Clack, MD  traMADol (ULTRAM) 50 MG tablet TAKE 1 TABLET BY MOUTH EVERY 6 HOURS AS NEEDED FOR PAIN 03/28/13   Biagio Borg, MD   BP 154/76 mmHg  Pulse 81  Temp(Src) 98.7 F (37.1 C) (Oral)  Resp 18  Ht '5\' 2"'  (1.575 m)  Wt 170 lb (77.111 kg)  BMI 31.09 kg/m2  SpO2 99% Physical Exam  Constitutional: She appears well-developed and well-nourished. No distress.  HENT:  Mouth/Throat: Oropharynx is clear and moist.  Eyes: Conjunctivae are normal. No scleral icterus.  Neck: Neck supple. No tracheal deviation present.  Cardiovascular: Normal rate, regular rhythm, normal heart sounds and intact distal pulses.   Pulmonary/Chest: Effort normal and breath sounds normal. No respiratory distress.  Abdominal: Soft. Normal appearance and bowel sounds are normal. She exhibits no distension and no mass. There is tenderness. There is no rebound and no guarding.  Moderate left lower abd tenderness.   Genitourinary:  No cva tenderness  Musculoskeletal: She exhibits no edema.  Neurological: She is alert.  Skin: Skin is warm  and dry. No rash noted. She is not diaphoretic.  No shingles/rash in area of pain  Psychiatric: She has a normal mood and affect.  Nursing note and vitals reviewed.   ED Course  Procedures (including critical care time) Labs Review   Results for orders placed or performed during the hospital encounter of 05/12/14  CBC with Differential  Result Value Ref Range   WBC 11.1 (H) 4.0 - 10.5 K/uL   RBC 4.22 3.87 - 5.11 MIL/uL   Hemoglobin 13.2 12.0 - 15.0 g/dL   HCT 38.5 36.0 - 46.0 %   MCV 91.2 78.0 - 100.0 fL   MCH 31.3 26.0 - 34.0 pg   MCHC 34.3 30.0 - 36.0 g/dL   RDW 13.8 11.5 - 15.5 %   Platelets 267 150 - 400 K/uL   Neutrophils Relative % 73 43 - 77 %   Neutro Abs 8.1 (H) 1.7 - 7.7 K/uL   Lymphocytes Relative 19 12 - 46 %   Lymphs Abs 2.1 0.7 - 4.0 K/uL   Monocytes Relative 7 3 - 12 %   Monocytes Absolute 0.8 0.1 - 1.0 K/uL   Eosinophils Relative 1 0 - 5 %   Eosinophils Absolute 0.1 0.0 - 0.7 K/uL   Basophils Relative 0 0 - 1 %   Basophils Absolute 0.0 0.0 - 0.1 K/uL  Lipase, blood  Result Value Ref Range   Lipase 35 11 - 59 U/L  Comprehensive metabolic panel  Result Value Ref Range   Sodium 137 137 - 147 mEq/L   Potassium 4.0 3.7 - 5.3 mEq/L   Chloride 98 96 - 112 mEq/L   CO2 25 19 - 32 mEq/L   Glucose, Bld 140 (H) 70 - 99 mg/dL   BUN 7 6 - 23 mg/dL   Creatinine, Ser 0.65 0.50 - 1.10 mg/dL   Calcium 10.1 8.4 - 10.5 mg/dL   Total Protein 8.0 6.0 - 8.3 g/dL   Albumin 3.8 3.5 - 5.2 g/dL   AST 19 0 - 37 U/L   ALT 16 0 - 35 U/L   Alkaline Phosphatase 80 39 - 117 U/L   Total  Bilirubin 0.2 (L) 0.3 - 1.2 mg/dL   GFR calc non Af Amer 87 (L) >90 mL/min   GFR calc Af Amer >90 >90 mL/min   Anion gap 14 5 - 15   Ct Abdomen Pelvis W Contrast  05/12/2014   CLINICAL DATA:  Four day history of left lower quadrant abdominal pain and nausea. Chronic constipation which has worsened in the past week. Current history of diverticulosis, diabetes and hypertension. Prior history of  diverticulitis. Surgical history includes cholecystectomy, appendectomy, hysterectomy and hernia repair.  EXAM: CT ABDOMEN AND PELVIS WITH CONTRAST  TECHNIQUE: Multidetector CT imaging of the abdomen and pelvis was performed using the standard protocol following bolus administration of intravenous contrast.  CONTRAST:  168m OMNIPAQUE IOHEXOL 300 MG/ML IV. Oral contrast was also administered.  COMPARISON:  CT abdomen and pelvis 09/13/2012, 04/15/2010, 09/10/2007.  FINDINGS: Thickening of the wall of the distal descending colon at its junction with the sigmoid colon, associated with pericolonic edema/inflammation. No extraluminal gas. No abnormal fluid collection. Extensive diverticulosis involving the descending and sigmoid colon, with scattered diverticula throughout the remainder of the colon. No acute inflammatory changes elsewhere. Stomach and small bowel normal in appearance. No ascites. No free intraperitoneal air.  Severe diffuse hepatic steatosis without focal hepatic parenchymal abnormality. Gallbladder surgically absent. No unexpected biliary ductal dilation. Normal appearing spleen, pancreas, and right adrenal gland. Approximate 1.6 x 1.3 x 2.5 cm left adrenal nodule of mixed attenuation, unchanged dating back to 2009. Normal-appearing kidneys. Moderate to severe aortoiliofemoral atherosclerosis without aneurysm. No significant lymphadenopathy.  Uterus surgically absent. Cystic structure low in the right side of the pelvis, adjacent to the rectum, containing a punctate calcification in its wall, measuring approximately 2.9 x 2.6 x 4.9 cm, contiguous with the right vaginal fornix. Urinary bladder unremarkable. Phleboliths low in both sides of the pelvis.  Bone window images demonstrate severe lower thoracic and lumbar spondylosis, degenerative disc disease diffusely throughout the lumbar spine, facet degenerative changes diffusely throughout the lumbar spine, and moderate degenerative changes in both hips.  Enthesopathic calcification is present at multiple muscular tendinous insertion sites in the pelvis and the greater trochanters of both femurs.  IMPRESSION: 1. Acute diverticulitis involving the distal descending colon at its junction with the sigmoid colon. No evidence of abscess or perforation. 2. Severe diffuse hepatic steatosis without focal hepatic parenchymal abnormality. 3. Stable left adrenal adenoma. 4. Approximate 5 cm cystic structure low in the right side of the pelvis. If the patient has her right ovary, this may represent an ovarian cyst. If not, it may represent a duplication cyst arising from the rectum. Please correlate with surgical history. If this is indeed a right ovarian cyst, this is a a suspicious finding in this postmenopausal patient an ultrasound would be suggested in further evaluation. This recommendation follows ACR consensus guidelines: White Paper of the ACR Incidental Findings Committee II on Adnexal Findings. J Am Coll Radiol [phone]   Electronically Signed   By: TEvangeline DakinM.D.   On: 05/12/2014 10:31       EKG Interpretation   Date/Time:  Monday May 12 2014 08:15:57 EST Ventricular Rate:  75 PR Interval:  134 QRS Duration: 84 QT Interval:  394 QTC Calculation: 440 R Axis:   72 Text Interpretation:  Sinus rhythm No significant change since last  tracing Confirmed by Sumiko Ceasar  MD, KLennette Bihari([id number] on 05/12/2014 8:29:11 AM      MDM   Iv ns bolus.  zofran iv.   Dilaudid iv for pain.  Labs.   Reviewed nursing notes and prior charts for additional history.   cipro and flagyl iv.   Recheck pain persists. Dilaudid 1 mg iv. zofran iv.   Recheck abd, no change.    ?pelvic lesion on ct - will get u/s.   Med service contacted for admission re persistent pain, nausea, in setting acute diverticulitis.  Admitting team to f/u u/s when done.      Mirna Mires, MD 05/12/14 1057

## 2014-05-12 NOTE — ED Notes (Signed)
Patient transported to CT 

## 2014-05-12 NOTE — ED Notes (Signed)
Reports lower abd pain thur, last BM 6 days ago, nausea with no vomiting, no F/D, burning with urination, A//O X4, NAD

## 2014-05-12 NOTE — ED Notes (Signed)
NAUSEA RESOLVED

## 2014-05-12 NOTE — Progress Notes (Signed)
Spoke with pt about CPAP. Pt states she does not wear one all the time and does not wish to wear one tonight. Instructed pt to call nurse if she changed her mind.

## 2014-05-12 NOTE — H&P (Signed)
History and Physical  [name]:[id number] DOB: 1940-08-31 DOA: 05/12/2014  Referring physician: Lajean Saver, ER physician PCP: Gwendolyn Grant, MD   Chief Complaint: abdominal pain  HPI: [name] is a 73 y.o. female  With past medical history of seizure disorder, diabetes, hypertension and diverticulosis who has had previous admissions for diverticulitis and started noticing left lower quadrant abdominal pain 4 days prior which has been slowly getting worse to the point which she could not take it anymore and came into the emergency room today. Her white blood cell count was noted to be minimally elevated. An abdominal CT was done which noted acute diverticulitis in the sigmoid area and also incidentally noted hepatic steatosis as well as a abnormal complex structure seen in the area of the right ovary. Hospitalists were called for further evaluation and admission.   Review of Systems:  Patient seen in the emergency room Pt complains of mild nausea with the biggest complaint being of left lower quadrant abdominal pain..  Pt denies any headaches, vision changes, dysphagia, chest pain, palpitations, shortness of breath, wheeze, cough, hematuria, dysuria, constipation, diarrhea, focal extremity numbness or weakness or pain.  Review of systems are otherwise negative  Past Medical History  Diagnosis Date  . Diabetes mellitus, type 2   . GERD (gastroesophageal reflux disease)   . Depression   . Seizure disorder   . Dyslipidemia   . OSA on CPAP   . Osteoarthritis of shoulder region     and Knee  . Anxiety   . Diverticulosis of colon 03/2010 hosp  . Hypertension   . Cerebrovascular disease, unspecified 03/13/2013    Atrophy and small vessel dz noted, MR brain 2009  . Allergic rhinitis, cause unspecified 03/13/2013   Past Surgical History  Procedure Laterality Date  . Cholecystectomy    . Abdominal hysterectomy  1970's    Partial  . Appendectomy    . Tonsillectomy and  adenoidectomy    . Shoulder surgery  2008    LT, post fall    Social History:  reports that she quit smoking about 28 years ago. She does not have any smokeless tobacco history on file. Her alcohol and drug histories are not on file. Patient lives at home with her husband & is able to participate in activities of daily living with use occasionally of a cane  Allergies  Allergen Reactions  . Other     "SEEDED" food due to stomach issues  . Statins Nausea And Vomiting  . Penicillins Hives and Rash    Family History  Problem Relation Age of Onset  . Arthritis Mother   . Heart disease Father   . Arthritis Other     Grandmother  . Diabetes Other     Grandmother    As discussed with patient  Prior to Admission medications   Medication Sig Start Date End Date Taking? Authorizing Provider  ACCU-CHEK AVIVA PLUS test strip Check blood sugar two times daily 08/29/13  Yes Rowe Clack, MD  albuterol (PROAIR HFA) 108 (90 BASE) MCG/ACT inhaler Inhale 2 puffs into the lungs every 6 (six) hours as needed for wheezing or shortness of breath.    Yes Historical Provider, MD  Alpha-D-Galactosidase (BEANO PO) Take 1-2 tablets by mouth daily as needed (for gas).   Yes Historical Provider, MD  ALPRAZolam (XANAX) 0.25 MG tablet Take 0.25 mg by mouth 2 (two) times daily as needed for anxiety.  08/11/11  Yes Rowe Clack, MD  aspirin  81 MG tablet Take 81 mg by mouth daily.     Yes Historical Provider, MD  azelastine (ASTELIN) 0.1 % nasal spray Place 2 sprays into both nostrils 2 (two) times daily as needed for rhinitis. Use in each nostril as directed   Yes Historical Provider, MD  Biotin 5000 MCG TABS Take 5,000 mcg by mouth daily. 11/29/12  Yes Rowe Clack, MD  Black Cohosh 80 MG CAPS Take 80 capsules by mouth daily as needed (for vitamin).   Yes Historical Provider, MD  Blood Glucose Monitoring Suppl (ACCU-CHEK AVIVA PLUS) W/DEVICE KIT 1 kit by Does not apply route once. 11/13/12  Yes  Rowe Clack, MD  Calcium Carbonate (CALCIUM 500 PO) Take 1 capsule by mouth every other day.    Yes Historical Provider, MD  Cholecalciferol (EQL VITAMIN D3) 1000 UNITS tablet Take 1,000 Units by mouth daily as needed (for vitamin).    Yes Historical Provider, MD  Cyanocobalamin (VITAMIN B 12) 100 MCG LOZG Take 100 mcg by mouth daily as needed (for vitamin).    Yes Historical Provider, MD  cyclobenzaprine (FLEXERIL) 5 MG tablet TAKE 1 TABLET (5 MG TOTAL) BY MOUTH 3 (THREE) TIMES DAILY AS NEEDED FOR MUSCLE SPASMS. 09/30/13  Yes Rowe Clack, MD  famotidine (PEPCID) 20 MG tablet Take 1 tablet (20 mg total) by mouth daily. Patient taking differently: Take 20 mg by mouth daily before supper.  12/05/13  Yes Rowe Clack, MD  fluticasone (FLONASE) 50 MCG/ACT nasal spray Place 1 spray into both nostrils daily as needed for allergies or rhinitis.   Yes Historical Provider, MD  FLUZONE HIGH-DOSE 0.5 ML SUSY 1 each once.  03/07/14  Yes Historical Provider, MD  gabapentin (NEURONTIN) 600 MG tablet Take 1 tablet (600 mg total) by mouth 3 (three) times daily. 12/24/13  Yes Rowe Clack, MD  glipiZIDE (GLUCOTROL XL) 5 MG 24 hr tablet TAKE 1 TABLET (5 MG TOTAL) BY MOUTH DAILY.   Yes Rowe Clack, MD  HYDROcodone-acetaminophen (NORCO) 5-325 MG per tablet Take 0.5-1 tablets by mouth every 8 (eight) hours as needed. 01/22/14  Yes Rowe Clack, MD  losartan (COZAAR) 50 MG tablet TAKE 1 TABLET BY MOUTH EVERY DAY 12/05/13  Yes Rowe Clack, MD  meclizine (ANTIVERT) 25 MG tablet Take 1 tablet (25 mg total) by mouth 3 (three) times daily as needed for dizziness or nausea. 12/24/13  Yes Rowe Clack, MD  metFORMIN (GLUCOPHAGE-XR) 500 MG 24 hr tablet Take 1 tablet (500 mg total) by mouth daily with breakfast. 12/05/13  Yes Rowe Clack, MD  Multiple Vitamin (MULTIVITAMIN) capsule Take 1 capsule by mouth daily as needed (for vitamin).    Yes Historical Provider, MD  OXcarbazepine  (TRILEPTAL) 150 MG tablet TAKE 1 AND 1/2 TABLET BY MOUTH TWICE DAILY 04/21/14  Yes Rowe Clack, MD  PARoxetine (PAXIL) 10 MG tablet Take 10 mg by mouth daily as needed (for mood).   Yes Historical Provider, MD  Probiotic Product (PROBIOTIC PO) Take 1 capsule by mouth daily after breakfast.    Yes Historical Provider, MD  promethazine-codeine (PHENERGAN WITH CODEINE) 6.25-10 MG/5ML syrup TAKE 1 TEASPOON BY MOUTH EVERY 4 HOURS AS NEEDED FOR COUGH 03/25/14  Yes Rowe Clack, MD  traMADol (ULTRAM) 50 MG tablet TAKE 1 TABLET BY MOUTH EVERY 6 HOURS AS NEEDED FOR PAIN 03/28/13  Yes Biagio Borg, MD  azelastine (ASTELIN) 137 MCG/SPRAY nasal spray Place 2 sprays into the nose 2 (two) times  daily. Use in each nostril as directed 08/11/11 08/10/12  Rowe Clack, MD    Physical Exam: BP 132/66 mmHg  Pulse 73  Temp(Src) 98.7 F (37.1 C) (Oral)  Resp 18  Ht _0  (1.575 m)  Wt 80.287 kg (177 lb)  BMI 32.37 kg/m2  SpO2 97%  General:  Alert and oriented 3, no acute distress Eyes: sclerae nonicteric, extraocular movements are intact ENT: normocephalic, atraumatic, mucous members are slightly dry Neck: no JVD Cardiovascular: regular rate and rhythm, S1-S2 Respiratory: clear to auscultation bilaterally Abdomen: soft, mild obese, tender in the left lower quadrant, hypoactive bowel sounds Skin: no skin breaks, tears or lesions Musculoskeletal: no clubbing or cyanosis or edema Psychiatric: patient is appropriate, no evidence of psychoses Neurologic: no focal deficits          Labs on Admission:  Basic Metabolic Panel:  Recent Labs Lab 05/12/14 0810  NA 137  K 4.0  CL 98  CO2 25  GLUCOSE 140*  BUN 7  CREATININE 0.65  CALCIUM 10.1   Liver Function Tests:  Recent Labs Lab 05/12/14 0810  AST 19  ALT 16  ALKPHOS 80  BILITOT 0.2*  PROT 8.0  ALBUMIN 3.8    Recent Labs Lab 05/12/14 0810  LIPASE 35   No results for input(s): AMMONIA in the last 168  hours. CBC:  Recent Labs Lab 05/12/14 0810  WBC 11.1*  NEUTROABS 8.1*  HGB 13.2  HCT 38.5  MCV 91.2  PLT 267   Cardiac Enzymes: No results for input(s): CKTOTAL, CKMB, CKMBINDEX, TROPONINI in the last 168 hours.  BNP (last 3 results) No results for input(s): PROBNP in the last 8760 hours. CBG: No results for input(s): GLUCAP in the last 168 hours.  Radiological Exams on Admission: US Transvaginal Non-ob US Pelvis Complete  05/12/2014    IMPRESSION: Loculated fluid collection in the right para midline adnexal region is of uncertain etiology. Differential diagnosis includes a postoperative seroma or peritoneal inclusion cyst. Right ovary is not visualized. If further evaluation is desired, MR pelvis without and with contrast is recommended.   Electronically Signed   By: Lorin Picket M.D.   On: 05/12/2014 12:39   Ct Abdomen Pelvis W Contrast  05/12/2014     IMPRESSION: 1. Acute diverticulitis involving the distal descending colon at its junction with the sigmoid colon. No evidence of abscess or perforation. 2. Severe diffuse hepatic steatosis without focal hepatic parenchymal abnormality. 3. Stable left adrenal adenoma. 4. Approximate 5 cm cystic structure low in the right side of the pelvis. If the patient has her right ovary, this may represent an ovarian cyst. If not, it may represent a duplication cyst arising from the rectum. Please correlate with surgical history. If this is indeed a right ovarian cyst, this is a a suspicious finding in this postmenopausal patient an ultrasound would be suggested in further evaluation. This recommendation follows ACR consensus guidelines: White Paper of the ACR Incidental Findings Committee II on Adnexal Findings. J Am Coll Radiol [phone].   Electronically Signed   By: Evangeline Dakin M.D.   On: 05/12/2014 10:31    EKG: Independently reviewed. Normal sinus rhythm  Assessment/Plan Present on Admission:  . Diverticulitis large  intestine w/o perforation or abscess w/o bleeding: Clear liquids, IV antibiotics. Pain and nausea control.  . Essential hypertension:currently well controlled . DM neuropathy, type II diabetes mellitus: Every 4 hours sliding scale only, A1c in June notes good control.  . OSA on CPAP: Continue CPap as  per patient . Depression: Continue SSRI  Hepatic steatosis: Incidentally noted on abdominal CT. Outpatient PCP can manage  Obesity: Patient meets criteria with BMI greater than 30  Abnormal CT of pelvis: adnexal structure seen on CT. Pelvic/transvaginal ultrasound ordered noting likely cystic fluid. As diverticulitis resolves, could consider MR of pelvis versus outpatient follow-up  Consultants: none  Code Status: full code as confirmed by patient  Family Communication: husband at the bedside   Disposition Plan: home once abdominal pain resolved, patient able to tolerate taking by mouth  Time spent: 35 minutes  [facility] Pager [phone]

## 2014-05-13 ENCOUNTER — Encounter (HOSPITAL_COMMUNITY): Payer: Self-pay | Admitting: *Deleted

## 2014-05-13 DIAGNOSIS — N949 Unspecified condition associated with female genital organs and menstrual cycle: Secondary | ICD-10-CM

## 2014-05-13 DIAGNOSIS — F329 Major depressive disorder, single episode, unspecified: Secondary | ICD-10-CM

## 2014-05-13 DIAGNOSIS — IMO0002 Reserved for concepts with insufficient information to code with codable children: Secondary | ICD-10-CM | POA: Insufficient documentation

## 2014-05-13 DIAGNOSIS — G40909 Epilepsy, unspecified, not intractable, without status epilepticus: Secondary | ICD-10-CM

## 2014-05-13 LAB — GLUCOSE, CAPILLARY
GLUCOSE-CAPILLARY: 96 mg/dL (ref 70–99)
Glucose-Capillary: 111 mg/dL — ABNORMAL HIGH (ref 70–99)
Glucose-Capillary: 114 mg/dL — ABNORMAL HIGH (ref 70–99)
Glucose-Capillary: 154 mg/dL — ABNORMAL HIGH (ref 70–99)
Glucose-Capillary: 178 mg/dL — ABNORMAL HIGH (ref 70–99)
Glucose-Capillary: 199 mg/dL — ABNORMAL HIGH (ref 70–99)

## 2014-05-13 LAB — BASIC METABOLIC PANEL
Anion gap: 13 (ref 5–15)
BUN: 6 mg/dL (ref 6–23)
CHLORIDE: 98 meq/L (ref 96–112)
CO2: 27 mEq/L (ref 19–32)
CREATININE: 0.69 mg/dL (ref 0.50–1.10)
Calcium: 9.2 mg/dL (ref 8.4–10.5)
GFR calc Af Amer: >90 mL/min (ref >90)
GFR calc non Af Amer: 86 mL/min — ABNORMAL LOW (ref >90)
GLUCOSE: 100 mg/dL — AB (ref 70–99)
Potassium: 3.4 mEq/L — ABNORMAL LOW (ref 3.7–5.3)
Sodium: 138 mEq/L (ref 137–147)

## 2014-05-13 LAB — CBC
HCT: 35.2 % — ABNORMAL LOW (ref 36.0–46.0)
Hemoglobin: 11.6 g/dL — ABNORMAL LOW (ref 12.0–15.0)
MCH: 30.4 pg (ref 26.0–34.0)
MCHC: 33 g/dL (ref 30.0–36.0)
MCV: 92.4 fL (ref 78.0–100.0)
Platelets: 250 10*3/uL (ref 150–400)
RBC: 3.81 MIL/uL — ABNORMAL LOW (ref 3.87–5.11)
RDW: 14.1 % (ref 11.5–15.5)
WBC: 7.4 10*3/uL (ref 4.0–10.5)

## 2014-05-13 MED ORDER — AZELASTINE HCL 0.1 % NA SOLN
2.0000 | Freq: Two times a day (BID) | NASAL | Status: DC
  Administered 2014-05-13 – 2014-05-16 (×7): 2 via NASAL
  Filled 2014-05-13: qty 30

## 2014-05-13 MED ORDER — DIPHENHYDRAMINE HCL 12.5 MG/5ML PO ELIX: 12.5000 mg | ORAL_SOLUTION | Freq: Four times a day (QID) | ORAL | Status: DC | PRN

## 2014-05-13 MED ORDER — DOCUSATE SODIUM 100 MG PO CAPS
100.0000 mg | ORAL_CAPSULE | Freq: Two times a day (BID) | ORAL | Status: DC
  Administered 2014-05-13 – 2014-05-14 (×3): 100 mg via ORAL
  Filled 2014-05-13 (×4): qty 1

## 2014-05-13 MED ORDER — INSULIN ASPART 100 UNIT/ML ~~LOC~~ SOLN
0.0000 [IU] | Freq: Three times a day (TID) | SUBCUTANEOUS | Status: DC
  Administered 2014-05-14: 2 [IU] via SUBCUTANEOUS
  Administered 2014-05-14 – 2014-05-15 (×3): 1 [IU] via SUBCUTANEOUS
  Administered 2014-05-15 (×2): 2 [IU] via SUBCUTANEOUS
  Administered 2014-05-16: 1 [IU] via SUBCUTANEOUS
  Administered 2014-05-16: 5 [IU] via SUBCUTANEOUS

## 2014-05-13 MED ORDER — DIPHENHYDRAMINE HCL 12.5 MG/5ML PO ELIX
12.5000 mg | ORAL_SOLUTION | Freq: Three times a day (TID) | ORAL | Status: DC | PRN
  Filled 2014-05-13 (×2): qty 5

## 2014-05-13 MED ORDER — GABAPENTIN 600 MG PO TABS
600.0000 mg | ORAL_TABLET | Freq: Three times a day (TID) | ORAL | Status: DC
  Administered 2014-05-13 – 2014-05-16 (×10): 600 mg via ORAL
  Filled 2014-05-13 (×12): qty 1

## 2014-05-13 MED ORDER — POLYETHYLENE GLYCOL 3350 17 GM/SCOOP PO POWD
0.5000 | Freq: Once | ORAL | Status: AC
  Administered 2014-05-13: 127.5 g via ORAL
  Filled 2014-05-13: qty 255

## 2014-05-13 MED ORDER — DIPHENHYDRAMINE HCL 12.5 MG/5ML PO ELIX
12.5000 mg | ORAL_SOLUTION | Freq: Two times a day (BID) | ORAL | Status: DC
  Administered 2014-05-13 – 2014-05-16 (×8): 12.5 mg via ORAL
  Filled 2014-05-13 (×9): qty 5

## 2014-05-13 MED ORDER — ASPIRIN EC 81 MG PO TBEC
81.0000 mg | DELAYED_RELEASE_TABLET | Freq: Every day | ORAL | Status: DC
  Administered 2014-05-13 – 2014-05-16 (×4): 81 mg via ORAL
  Filled 2014-05-13 (×4): qty 1

## 2014-05-13 MED ORDER — POTASSIUM CHLORIDE CRYS ER 20 MEQ PO TBCR
40.0000 meq | EXTENDED_RELEASE_TABLET | Freq: Once | ORAL | Status: AC
  Administered 2014-05-13: 40 meq via ORAL
  Filled 2014-05-13: qty 2

## 2014-05-13 MED ORDER — SENNA 8.6 MG PO TABS
2.0000 | ORAL_TABLET | Freq: Every day | ORAL | Status: DC
  Administered 2014-05-13 – 2014-05-14 (×2): 17.2 mg via ORAL
  Filled 2014-05-13 (×2): qty 2

## 2014-05-13 NOTE — Plan of Care (Signed)
Problem: Phase I Progression Outcomes Goal: OOB as tolerated unless otherwise ordered Outcome: Completed/Met Date Met:  05/13/14 Patient up to bathroom with standby assist. Goal: Voiding-avoid urinary catheter unless indicated Outcome: Completed/Met Date Met:  05/13/14 Goal: Hemodynamically stable Outcome: Progressing Goal: Other Phase I Outcomes/Goals Outcome: Not Applicable Date Met:  28/00/34

## 2014-05-13 NOTE — Progress Notes (Signed)
Pt refused CPAP QHS.

## 2014-05-13 NOTE — Progress Notes (Signed)
RN in room to assist patient to bathroom.  Patient stated "I'm itchy all over, but I don't see anything".  RN assessed, no visible redness or hives.  Patient stated "it might be because this room is so hot".  Vital signs stable, oral temperature 99.5.  Temperature in room adjusted.  NP on call notified, Benadryl ordered.  Will administer when available and continue to monitor.

## 2014-05-13 NOTE — Progress Notes (Signed)
PROGRESS NOTE  [name] ZJI:[id number] DOB: 1940-11-03 DOA: 05/12/2014 PCP: Gwendolyn Grant, MD  Brief history 73 year old female with a history of seizure disorder, diabetes mellitus, hypertension presented with 4 day history of left lower quadrant abdominal pain. She had some nausea without any diarrhea. The patient had been complaining of constipation not having moved her bowels in approximately 5 days. She denied any chest pain, shortness breath, coughing, hemoptysis, dysuria, hematuria. No hematochezia or melena. CT of the abdomen and pelvis at the time of admission on 05/12/2014 showed acute diverticulitis of the descending colon without abscess. There is also a 5 cm cystic structure in the right pelvis. Vaginal ultrasound revealed a loculated fluid collection in the right periumbilical midline of the adnexa.  Assessment/Plan:  Present on Admission:  Diverticulitis large intestine w/o perforation or abscess w/o bleeding:  Second episode.  Patient mentions constipation despite prunes, prune juice and miralax. -Clear liquids -IV antibiotics (Cipro/Flagyl started 11/16), Give 1/2 miralax prep slowly.  Stop prep after the patient has had several BMs. Pain and nausea control.  Advised that she will need to follow up with [facility] post discharge for colonoscopy. -add colace and senna -may need enema if still no BM  Essential hypertension: Currently well controlled.  Holding losartan until the patient's BP rises. -blood pressure remains stable off anti-HTN   DM neuropathy, type II diabetes mellitus:  AC & HS sliding scale only, A1c in June notes good control. -HbA1C--6.7  OSA on CPAP: Continue CPap as per patient  Depression:  Continue SSRI  Hepatic steatosis:  Incidentally noted on abdominal CT. Outpatient PCP can manage  Obesity: Patient meets criteria with BMI greater than 30  Abnormal CT of pelvis:  Adnexal structure seen on CT. Pelvic/transvaginal ultrasound  ordered noting likely cystic fluid. As diverticulitis resolves, could consider MR of pelvis versus outpatient follow-up  Dizziness Likely due to infection or orthostasis; Check [facility] PT Eval IVF   DVT Prophylaxis:  lovenox  Code Status: full Family Communication: husband updated at bedside. Disposition Plan: to home when appropriate.   Consultants:  none  Procedures:  none  Antibiotics: Anti-infectives    Start     Dose/Rate Route Frequency Ordered Stop   05/12/14 2300  ciprofloxacin (CIPRO) IVPB 400 mg     400 mg200 mL/hr over 60 Minutes Intravenous Every 12 hours 05/12/14 1619     05/12/14 2045  metroNIDAZOLE (FLAGYL) IVPB 500 mg     500 mg100 mL/hr over 60 Minutes Intravenous Every 8 hours 05/12/14 1619     05/12/14 1100  ciprofloxacin (CIPRO) IVPB 400 mg     400 mg200 mL/hr over 60 Minutes Intravenous  Once 05/12/14 1055 05/12/14 1241   05/12/14 1100  metroNIDAZOLE (FLAGYL) IVPB 500 mg     500 mg100 mL/hr over 60 Minutes Intravenous  Once 05/12/14 1055 05/12/14 1332        HPI/Subjective: Complaining of left sided abdominal pain and dizziness when standing.denies fevers, chills, chest pain, shortness breath, vomiting, diarrhea. Feels constipated. Has not had a bowel movement in about 4-5 days.  Objective: Filed Vitals:   05/12/14 1618 05/12/14 2205 05/13/14 0122 05/13/14 0530  BP: 132/66 128/65 115/70 111/73  Pulse: 73 74 68 71  Temp: 98.7 F (37.1 C) 98.3 F (36.8 C) 99.5 F (37.5 C) 99.2 F (37.3 C)  TempSrc: Oral Oral Oral Oral  Resp: 18 16 18 18   Height:      Weight:      SpO2: 97% 97%  98% 99%    Intake/Output Summary (Last 24 hours) at 05/13/14 1227 Last data filed at 05/13/14 0956  Gross per 24 hour  Intake 1323.33 ml  Output      0 ml  Net 1323.33 ml   Filed Weights   05/24/2014 0758 2014-05-24 1608  Weight: 77.111 kg (170 lb) 80.287 kg (177 lb)    Exam: General: Well developed, well nourished, NAD, appears stated age  32:   PERR, EOMI, Anicteic Sclera, MMM. No pharyngeal erythema or exudates  Neck: Supple, no JVD, no masses  Cardiovascular: RRR, S1 S2 auscultated, no rubs, murmurs or gallops.   Respiratory: Clear to auscultation bilaterally with equal chest rise  Abdomen: Soft, tender to palpation on the LLQ, nondistended, + bowel sounds  Extremities: warm dry without cyanosis clubbing or edema.  Neuro: AAOx3, cranial nerves grossly intact. Strength 5/5 in upper and lower extremities  Skin: Without rashes exudates or nodules.   Psych: Normal affect and demeanor with intact judgement and insight       Data Reviewed: Basic Metabolic Panel:  Recent Labs Lab 05-24-14 0810 05-24-2014 1725 05/13/14 0633  NA 137  --  138  K 4.0  --  3.4*  CL 98  --  98  CO2 25  --  27  GLUCOSE 140*  --  100*  BUN 7  --  6  CREATININE 0.65 0.64 0.69  CALCIUM 10.1  --  9.2   Liver Function Tests:  Recent Labs Lab 05/24/2014 0810  AST 19  ALT 16  ALKPHOS 80  BILITOT 0.2*  PROT 8.0  ALBUMIN 3.8    Recent Labs Lab 05-24-2014 0810  LIPASE 35   CBC:  Recent Labs Lab 2014/05/24 0810 May 24, 2014 1725 05/13/14 0633  WBC 11.1* 10.3 7.4  NEUTROABS 8.1*  --   --   HGB 13.2 12.4 11.6*  HCT 38.5 37.3 35.2*  MCV 91.2 89.4 92.4  PLT 267 251 250   CBG:  Recent Labs Lab May 24, 2014 2125 05/13/14 0014 05/13/14 0449 05/13/14 0736 05/13/14 1156  GLUCAP 124* 154* 96 111* 178*      Studies: US Transvaginal Non-ob  2014/05/24   CLINICAL DATA:  Followup CT, evaluate for adnexal or ovarian cystic lesion.  EXAM: TRANSABDOMINAL AND TRANSVAGINAL ULTRASOUND OF PELVIS  TECHNIQUE: Both transabdominal and transvaginal ultrasound examinations of the pelvis were performed. Transabdominal technique was performed for global imaging of the pelvis including uterus, ovaries, adnexal regions, and pelvic cul-de-sac. It was necessary to proceed with endovaginal exam following the transabdominal exam to visualize the right adnexa.   COMPARISON:  CT abdomen pelvis 05/24/2014.  FINDINGS: Uterus  Surgically absent.  Endometrium  Surgically absent.  Right ovary  Not visualized. A 4.3 x 2.0 x 2.2 cm fluid collection is seen in the right para midline adnexal region. It has internal echoes. No solid components.  Left ovary  Not visualized.  No adnexal mass.  Other findings  No dependent free fluid.  IMPRESSION: Loculated fluid collection in the right para midline adnexal region is of uncertain etiology. Differential diagnosis includes a postoperative seroma or peritoneal inclusion cyst. Right ovary is not visualized. If further evaluation is desired, MR pelvis without and with contrast is recommended.   Electronically Signed   By: Lorin Picket M.D.   On: 05/24/14 12:39   US Pelvis Complete  05-24-14   CLINICAL DATA:  Followup CT, evaluate for adnexal or ovarian cystic lesion.  EXAM: TRANSABDOMINAL AND TRANSVAGINAL ULTRASOUND OF PELVIS  TECHNIQUE: Both transabdominal  and transvaginal ultrasound examinations of the pelvis were performed. Transabdominal technique was performed for global imaging of the pelvis including uterus, ovaries, adnexal regions, and pelvic cul-de-sac. It was necessary to proceed with endovaginal exam following the transabdominal exam to visualize the right adnexa.  COMPARISON:  CT abdomen pelvis 05/12/2014.  FINDINGS: Uterus  Surgically absent.  Endometrium  Surgically absent.  Right ovary  Not visualized. A 4.3 x 2.0 x 2.2 cm fluid collection is seen in the right para midline adnexal region. It has internal echoes. No solid components.  Left ovary  Not visualized.  No adnexal mass.  Other findings  No dependent free fluid.  IMPRESSION: Loculated fluid collection in the right para midline adnexal region is of uncertain etiology. Differential diagnosis includes a postoperative seroma or peritoneal inclusion cyst. Right ovary is not visualized. If further evaluation is desired, MR pelvis without and with contrast is  recommended.   Electronically Signed   By: Lorin Picket M.D.   On: 05/12/2014 12:39   Ct Abdomen Pelvis W Contrast  05/12/2014   CLINICAL DATA:  Four day history of left lower quadrant abdominal pain and nausea. Chronic constipation which has worsened in the past week. Current history of diverticulosis, diabetes and hypertension. Prior history of diverticulitis. Surgical history includes cholecystectomy, appendectomy, hysterectomy and hernia repair.  EXAM: CT ABDOMEN AND PELVIS WITH CONTRAST  TECHNIQUE: Multidetector CT imaging of the abdomen and pelvis was performed using the standard protocol following bolus administration of intravenous contrast.  CONTRAST:  17mL OMNIPAQUE IOHEXOL 300 MG/ML IV. Oral contrast was also administered.  COMPARISON:  CT abdomen and pelvis 09/13/2012, 04/15/2010, 09/10/2007.  FINDINGS: Thickening of the wall of the distal descending colon at its junction with the sigmoid colon, associated with pericolonic edema/inflammation. No extraluminal gas. No abnormal fluid collection. Extensive diverticulosis involving the descending and sigmoid colon, with scattered diverticula throughout the remainder of the colon. No acute inflammatory changes elsewhere. Stomach and small bowel normal in appearance. No ascites. No free intraperitoneal air.  Severe diffuse hepatic steatosis without focal hepatic parenchymal abnormality. Gallbladder surgically absent. No unexpected biliary ductal dilation. Normal appearing spleen, pancreas, and right adrenal gland. Approximate 1.6 x 1.3 x 2.5 cm left adrenal nodule of mixed attenuation, unchanged dating back to 2009. Normal-appearing kidneys. Moderate to severe aortoiliofemoral atherosclerosis without aneurysm. No significant lymphadenopathy.  Uterus surgically absent. Cystic structure low in the right side of the pelvis, adjacent to the rectum, containing a punctate calcification in its wall, measuring approximately 2.9 x 2.6 x 4.9 cm, contiguous with  the right vaginal fornix. Urinary bladder unremarkable. Phleboliths low in both sides of the pelvis.  Bone window images demonstrate severe lower thoracic and lumbar spondylosis, degenerative disc disease diffusely throughout the lumbar spine, facet degenerative changes diffusely throughout the lumbar spine, and moderate degenerative changes in both hips. Enthesopathic calcification is present at multiple muscular tendinous insertion sites in the pelvis and the greater trochanters of both femurs.  IMPRESSION: 1. Acute diverticulitis involving the distal descending colon at its junction with the sigmoid colon. No evidence of abscess or perforation. 2. Severe diffuse hepatic steatosis without focal hepatic parenchymal abnormality. 3. Stable left adrenal adenoma. 4. Approximate 5 cm cystic structure low in the right side of the pelvis. If the patient has her right ovary, this may represent an ovarian cyst. If not, it may represent a duplication cyst arising from the rectum. Please correlate with surgical history. If this is indeed a right ovarian cyst, this is a a suspicious  finding in this postmenopausal patient an ultrasound would be suggested in further evaluation. This recommendation follows ACR consensus guidelines: White Paper of the ACR Incidental Findings Committee II on Adnexal Findings. J Am Coll Radiol [phone].   Electronically Signed   By: Evangeline Dakin M.D.   On: 05/12/2014 10:31    Scheduled Meds: . aspirin EC  81 mg Oral Daily  . azelastine  2 spray Each Nare BID  . ciprofloxacin  400 mg Intravenous Q12H  . diphenhydrAMINE  12.5 mg Oral Q12H  . enoxaparin (LOVENOX) injection  40 mg Subcutaneous Q24H  . gabapentin  600 mg Oral TID  . insulin aspart  0-9 Units Subcutaneous TID WC  . metronidazole  500 mg Intravenous Q8H  . OXcarbazepine  225 mg Oral BID   Continuous Infusions:   Principal Problem:   Diverticulitis large intestine w/o perforation or abscess w/o bleeding Active  Problems:   Essential hypertension   DM neuropathy, type II diabetes mellitus   OSA on CPAP   Depression   Seizure disorder   Abnormal CT scan, pelvis   Hepatic steatosis   Obesity (BMI 30-39.9)    Karen Kitchens  [facility] Pager [phone]. If 7PM-7AM, please contact night-coverage at [email or web address], password [facility] 05/13/2014, 12:27 PM  LOS: 1 day

## 2014-05-14 LAB — BASIC METABOLIC PANEL
Anion gap: 13 (ref 5–15)
BUN: 6 mg/dL (ref 6–23)
CO2: 24 meq/L (ref 19–32)
CREATININE: 0.68 mg/dL (ref 0.50–1.10)
Calcium: 9.6 mg/dL (ref 8.4–10.5)
Chloride: 98 mEq/L (ref 96–112)
GFR calc non Af Amer: 86 mL/min — ABNORMAL LOW (ref >90)
Glucose, Bld: 129 mg/dL — ABNORMAL HIGH (ref 70–99)
Potassium: 4.1 mEq/L (ref 3.7–5.3)
Sodium: 135 mEq/L — ABNORMAL LOW (ref 137–147)

## 2014-05-14 LAB — GLUCOSE, CAPILLARY
GLUCOSE-CAPILLARY: 127 mg/dL — AB (ref 70–99)
GLUCOSE-CAPILLARY: 139 mg/dL — AB (ref 70–99)
GLUCOSE-CAPILLARY: 171 mg/dL — AB (ref 70–99)
Glucose-Capillary: 135 mg/dL — ABNORMAL HIGH (ref 70–99)
Glucose-Capillary: 176 mg/dL — ABNORMAL HIGH (ref 70–99)
Glucose-Capillary: 134 mg/dL — ABNORMAL HIGH (ref 70–99)

## 2014-05-14 MED ORDER — SODIUM CHLORIDE 0.9 % IV SOLN
INTRAVENOUS | Status: DC
  Administered 2014-05-14: 1000 mL via INTRAVENOUS

## 2014-05-14 MED ORDER — IBUPROFEN 600 MG PO TABS
600.0000 mg | ORAL_TABLET | Freq: Four times a day (QID) | ORAL | Status: DC | PRN
  Administered 2014-05-14 – 2014-05-15 (×2): 600 mg via ORAL
  Filled 2014-05-14 (×3): qty 1

## 2014-05-14 NOTE — Progress Notes (Signed)
Chaplain responded to consult that patient desired prayer.  Pt described pain from abdomin and informed chaplain of condition.  Pt claims pain is "really bad."  Pt also shares that she "knows the Lord" and finds that relationship "very pleasing."  It is apparent that spirituality is important to pt and family.  Chaplain prayed with pt and provided emotional and spiritual support as well as the ministry of presence and empathetic listening.  Chaplain will follow up with pt as needed.   05/14/14 1200  Clinical Encounter Type  Visited With Patient and family together;Health care provider  Visit Type Initial;Spiritual support;Social support  Referral From Nurse  Spiritual Encounters  Spiritual Needs Prayer;Emotional  Stress Factors  Patient Stress Factors Health changes  Family Stress Factors None identified  Advance Directives (For Healthcare)  Does patient have an advance directive? No   Nelwyn Salisbury A.

## 2014-05-14 NOTE — Plan of Care (Signed)
Problem: Phase II Progression Outcomes Goal: Progress activity as tolerated unless otherwise ordered Outcome: Completed/Met Date Met:  05/14/14 Goal: Obtain order to discontinue catheter if appropriate Outcome: Not Applicable Date Met:  33/91/79 Goal: Other Phase II Outcomes/Goals Outcome: Not Applicable Date Met:  21/78/37  Problem: Phase III Progression Outcomes Goal: Voiding independently Outcome: Completed/Met Date Met:  05/14/14 Goal: Foley discontinued Outcome: Not Applicable Date Met:  54/23/70 Goal: Other Phase III Outcomes/Goals Outcome: Not Applicable Date Met:  23/01/72  Problem: Discharge Progression Outcomes Goal: Pain controlled with appropriate interventions Outcome: Completed/Met Date Met:  05/14/14 Goal: Tolerating diet Outcome: Completed/Met Date Met:  05/14/14 Goal: Other Discharge Outcomes/Goals Outcome: Not Applicable Date Met:  03/07/67

## 2014-05-14 NOTE — Progress Notes (Signed)
PROGRESS NOTE  KARALINA TIFT BSW:[id number] DOB: 1940-07-29 DOA: 05/12/2014 PCP: Gwendolyn Grant, MD  Brief history 73 year old female with a history of seizure disorder, diabetes mellitus, hypertension presented with 4 day history of left lower quadrant abdominal pain. She had some nausea without any diarrhea. The patient had been complaining of constipation, not having moved her bowels in approximately 5 days. She denied any chest pain, shortness breath, coughing, hemoptysis, dysuria, hematuria. No hematochezia or melena. CT of the abdomen and pelvis at the time of admission on 05/12/2014 showed acute diverticulitis of the descending colon without abscess. There is also a 5 cm cystic structure in the right pelvis. Vaginal ultrasound revealed a loculated fluid collection in the right periumbilical midline of the adnexa.  Assessment/Plan:  Present on Admission:  Diverticulitis large intestine w/o perforation or abscess w/o bleeding:  Second episode.  Patient mentions constipation despite prunes, prune juice and miralax. IV antibiotics (Cipro/Flagyl started 11/16), Give 1/2 miralax prep slowly.  Stop prep after the patient has had several BMs.  Pain and nausea control.  Advised that she will need to follow up with [facility] post discharge for colonoscopy.   Currently on a full liquid diet.  Essential Hypertension Currently controlled.  Holding losartan until the patient's BP rises.  Blood pressure remains stable off anti-HTN  DM neuropathy, type II diabetes mellitus:  AC & HS sliding scale only, A1c in June notes good control.  HbA1C--6.7  OSA on CPAP: Continue CPap as per patient.  Patient refused cpap 11/17.  Depression:  Continue SSRI  Hepatic steatosis:  Incidentally noted on abdominal CT. Outpatient PCP can manage  Obesity: Patient meets criteria with BMI greater than 30  Abnormal CT of pelvis:  Adnexal structure seen on CT. Pelvic/transvaginal ultrasound ordered noting  likely cystic fluid. As diverticulitis resolves, could consider MR of pelvis versus outpatient follow-up.    DVT Prophylaxis:  lovenox  Code Status: full Family Communication: husband updated at bedside. Disposition Plan: to home when appropriate.   Consultants:  none  Procedures:  none  Antibiotics: Anti-infectives    Start     Dose/Rate Route Frequency Ordered Stop   05/12/14 2300  ciprofloxacin (CIPRO) IVPB 400 mg     400 mg200 mL/hr over 60 Minutes Intravenous Every 12 hours 05/12/14 1619     05/12/14 2045  metroNIDAZOLE (FLAGYL) IVPB 500 mg     500 mg100 mL/hr over 60 Minutes Intravenous Every 8 hours 05/12/14 1619     05/12/14 1100  ciprofloxacin (CIPRO) IVPB 400 mg     400 mg200 mL/hr over 60 Minutes Intravenous  Once 05/12/14 1055 05/12/14 1241   05/12/14 1100  metroNIDAZOLE (FLAGYL) IVPB 500 mg     500 mg100 mL/hr over 60 Minutes Intravenous  Once 05/12/14 1055 05/12/14 1332        HPI/Subjective: Still with left sided abdominal pain.  Had 1 explosive BM yesterday.  Complaining of HA this am.  Objective: Filed Vitals:   05/13/14 0530 05/13/14 1339 05/13/14 2152 05/14/14 0541  BP: 111/73 144/75 132/57 125/60  Pulse: 71  83 62  Temp: 99.2 F (37.3 C) 97.7 F (36.5 C) 98.4 F (36.9 C) 98.3 F (36.8 C)  TempSrc: Oral Oral Oral Oral  Resp: 18 18 20 18   Height:      Weight:      SpO2: 99% 97% 99% 99%    Intake/Output Summary (Last 24 hours) at 05/14/14 1149 Last data filed at 05/14/14 1046  Gross per 24  hour  Intake    240 ml  Output      0 ml  Net    240 ml   Filed Weights   08-Jun-2014 0758 06/08/2014 1608  Weight: 77.111 kg (170 lb) 80.287 kg (177 lb)    Exam: General: Well developed, well nourished, NAD, appears stated age  24:  PERR, EOMI, Anicteic Sclera, MMM. No pharyngeal erythema or exudates  Neck: Supple, no JVD, no masses  Cardiovascular: RRR, S1 S2 auscultated, no rubs, murmurs or gallops.   Respiratory: Clear to auscultation  bilaterally with equal chest rise  Abdomen: Soft, tender to palpation on the LLQ, nondistended, + bowel sounds  Extremities: warm dry without cyanosis clubbing or edema.  Neuro: AAOx3, cranial nerves grossly intact. Strength 5/5 in upper and lower extremities  Skin: Without rashes exudates or nodules.   Psych: Normal affect and demeanor with intact judgement and insight       Data Reviewed: Basic Metabolic Panel:  Recent Labs Lab 06/08/14 0810 2014-06-08 1725 05/13/14 0633 05/14/14 0617  NA 137  --  138 135*  K 4.0  --  3.4* 4.1  CL 98  --  98 98  CO2 25  --  27 24  GLUCOSE 140*  --  100* 129*  BUN 7  --  6 6  CREATININE 0.65 0.64 0.69 0.68  CALCIUM 10.1  --  9.2 9.6   Liver Function Tests:  Recent Labs Lab 08-Jun-2014 0810  AST 19  ALT 16  ALKPHOS 80  BILITOT 0.2*  PROT 8.0  ALBUMIN 3.8    Recent Labs Lab 2014-06-08 0810  LIPASE 35   CBC:  Recent Labs Lab 06-08-2014 0810 06-08-2014 1725 05/13/14 0633  WBC 11.1* 10.3 7.4  NEUTROABS 8.1*  --   --   HGB 13.2 12.4 11.6*  HCT 38.5 37.3 35.2*  MCV 91.2 89.4 92.4  PLT 267 251 250   CBG:  Recent Labs Lab 05/13/14 1641 05/13/14 2011 05/14/14 0028 05/14/14 0432 05/14/14 0821  GLUCAP 114* 199* 171* 139* 135*      Studies: US Transvaginal Non-ob  2014/06/08   CLINICAL DATA:  Followup CT, evaluate for adnexal or ovarian cystic lesion.  EXAM: TRANSABDOMINAL AND TRANSVAGINAL ULTRASOUND OF PELVIS  TECHNIQUE: Both transabdominal and transvaginal ultrasound examinations of the pelvis were performed. Transabdominal technique was performed for global imaging of the pelvis including uterus, ovaries, adnexal regions, and pelvic cul-de-sac. It was necessary to proceed with endovaginal exam following the transabdominal exam to visualize the right adnexa.  COMPARISON:  CT abdomen pelvis Jun 08, 2014.  FINDINGS: Uterus  Surgically absent.  Endometrium  Surgically absent.  Right ovary  Not visualized. A 4.3 x 2.0 x 2.2 cm  fluid collection is seen in the right para midline adnexal region. It has internal echoes. No solid components.  Left ovary  Not visualized.  No adnexal mass.  Other findings  No dependent free fluid.  IMPRESSION: Loculated fluid collection in the right para midline adnexal region is of uncertain etiology. Differential diagnosis includes a postoperative seroma or peritoneal inclusion cyst. Right ovary is not visualized. If further evaluation is desired, MR pelvis without and with contrast is recommended.   Electronically Signed   By: Lorin Picket M.D.   On: 06/08/2014 12:39   US Pelvis Complete  2014/06/08   CLINICAL DATA:  Followup CT, evaluate for adnexal or ovarian cystic lesion.  EXAM: TRANSABDOMINAL AND TRANSVAGINAL ULTRASOUND OF PELVIS  TECHNIQUE: Both transabdominal and transvaginal ultrasound examinations of the pelvis  were performed. Transabdominal technique was performed for global imaging of the pelvis including uterus, ovaries, adnexal regions, and pelvic cul-de-sac. It was necessary to proceed with endovaginal exam following the transabdominal exam to visualize the right adnexa.  COMPARISON:  CT abdomen pelvis 05/12/2014.  FINDINGS: Uterus  Surgically absent.  Endometrium  Surgically absent.  Right ovary  Not visualized. A 4.3 x 2.0 x 2.2 cm fluid collection is seen in the right para midline adnexal region. It has internal echoes. No solid components.  Left ovary  Not visualized.  No adnexal mass.  Other findings  No dependent free fluid.  IMPRESSION: Loculated fluid collection in the right para midline adnexal region is of uncertain etiology. Differential diagnosis includes a postoperative seroma or peritoneal inclusion cyst. Right ovary is not visualized. If further evaluation is desired, MR pelvis without and with contrast is recommended.   Electronically Signed   By: Lorin Picket M.D.   On: 05/12/2014 12:39    Scheduled Meds: . aspirin EC  81 mg Oral Daily  . azelastine  2 spray Each  Nare BID  . ciprofloxacin  400 mg Intravenous Q12H  . diphenhydrAMINE  12.5 mg Oral Q12H  . docusate sodium  100 mg Oral BID  . enoxaparin (LOVENOX) injection  40 mg Subcutaneous Q24H  . gabapentin  600 mg Oral TID  . insulin aspart  0-9 Units Subcutaneous TID WC  . metronidazole  500 mg Intravenous Q8H  . OXcarbazepine  225 mg Oral BID  . senna  2 tablet Oral Daily   Continuous Infusions: . sodium chloride 1,000 mL (05/14/14 0730)    Principal Problem:   Diverticulitis large intestine w/o perforation or abscess w/o bleeding Active Problems:   Essential hypertension   DM neuropathy, type II diabetes mellitus   OSA on CPAP   Depression   Seizure disorder   Abnormal CT scan, pelvis   Hepatic steatosis   Obesity (BMI 30-39.9)   Pelvic cyst    Karen Kitchens  [facility] Pager [phone]. If 7PM-7AM, please contact night-coverage at [email or web address], password [facility] 05/14/2014, 11:49 AM  LOS: 2 days

## 2014-05-15 LAB — CBC
HCT: 35 % — ABNORMAL LOW (ref 36.0–46.0)
Hemoglobin: 11.8 g/dL — ABNORMAL LOW (ref 12.0–15.0)
MCH: 30.4 pg (ref 26.0–34.0)
MCHC: 33.7 g/dL (ref 30.0–36.0)
MCV: 90.2 fL (ref 78.0–100.0)
Platelets: 258 10*3/uL (ref 150–400)
RBC: 3.88 MIL/uL (ref 3.87–5.11)
RDW: 13.5 % (ref 11.5–15.5)
WBC: 6.1 10*3/uL (ref 4.0–10.5)

## 2014-05-15 LAB — GLUCOSE, CAPILLARY
GLUCOSE-CAPILLARY: 128 mg/dL — AB (ref 70–99)
GLUCOSE-CAPILLARY: 146 mg/dL — AB (ref 70–99)
GLUCOSE-CAPILLARY: 174 mg/dL — AB (ref 70–99)
Glucose-Capillary: 122 mg/dL — ABNORMAL HIGH (ref 70–99)
Glucose-Capillary: 156 mg/dL — ABNORMAL HIGH (ref 70–99)
Glucose-Capillary: 181 mg/dL — ABNORMAL HIGH (ref 70–99)

## 2014-05-15 LAB — BASIC METABOLIC PANEL
Anion gap: 16 — ABNORMAL HIGH (ref 5–15)
BUN: 7 mg/dL (ref 6–23)
CHLORIDE: 97 meq/L (ref 96–112)
CO2: 22 mEq/L (ref 19–32)
Calcium: 9.5 mg/dL (ref 8.4–10.5)
Creatinine, Ser: 0.67 mg/dL (ref 0.50–1.10)
GFR calc Af Amer: >90 mL/min (ref >90)
GFR calc non Af Amer: 86 mL/min — ABNORMAL LOW (ref >90)
GLUCOSE: 124 mg/dL — AB (ref 70–99)
POTASSIUM: 4 meq/L (ref 3.7–5.3)
Sodium: 135 mEq/L — ABNORMAL LOW (ref 137–147)

## 2014-05-15 MED ORDER — TRAMADOL HCL 50 MG PO TABS: 50.0000 mg | ORAL_TABLET | Freq: Four times a day (QID) | ORAL | Status: DC | PRN

## 2014-05-15 MED ORDER — METRONIDAZOLE 500 MG PO TABS
500.0000 mg | ORAL_TABLET | Freq: Three times a day (TID) | ORAL | Status: DC
  Administered 2014-05-15 – 2014-05-16 (×5): 500 mg via ORAL
  Filled 2014-05-15 (×7): qty 1

## 2014-05-15 MED ORDER — LOSARTAN POTASSIUM 50 MG PO TABS
50.0000 mg | ORAL_TABLET | Freq: Every day | ORAL | Status: DC
  Administered 2014-05-15 – 2014-05-16 (×2): 50 mg via ORAL
  Filled 2014-05-15 (×2): qty 1

## 2014-05-15 MED ORDER — CIPROFLOXACIN HCL 500 MG PO TABS
500.0000 mg | ORAL_TABLET | Freq: Two times a day (BID) | ORAL | Status: DC
  Administered 2014-05-15 – 2014-05-16 (×3): 500 mg via ORAL
  Filled 2014-05-15 (×5): qty 1

## 2014-05-15 MED ORDER — DOCUSATE SODIUM 100 MG PO CAPS
100.0000 mg | ORAL_CAPSULE | Freq: Two times a day (BID) | ORAL | Status: DC
  Administered 2014-05-15 – 2014-05-16 (×3): 100 mg via ORAL
  Filled 2014-05-15 (×4): qty 1

## 2014-05-15 MED ORDER — CYCLOBENZAPRINE HCL 10 MG PO TABS: 5.0000 mg | ORAL_TABLET | Freq: Three times a day (TID) | ORAL | Status: DC | PRN

## 2014-05-15 NOTE — Progress Notes (Addendum)
PROGRESS NOTE  [name] EXN:[id number] DOB: 05-Apr-1941 DOA: 05/12/2014 PCP: Gwendolyn Grant, MD  HPI/Subjective: 73 year old patient with a past medical history significant for seizure disorder, DM, and HTN, presented to the hospital with a 4 day history of LLQ pain. She had nausea but no diarrhea. CT of abdomen and pelvis on admission showed acute diverticulitis of the descending colon without abscess.   Today the patient is having less abdominal pain. So far she has tolerated her advanced soft diet and feels that she might have a bowel movement soon. It was explained to the patient that there was a incidental finding of a 5 cm cystic structure in the right pelvis.  A vaginal ultrasound revealed a loculated fluid collection in the right periumbilical midline of the adnexa. It was explained to the patient that she should get outpatient follow up for this finding.     Assessment/Plan:  Diverticulitis large intestine w/o perforation or abscess w/o bleeding:  - Second episode. - Patient mentions constipation despite prunes, prune juice and miralax.  - IV Cipro/Flagyl started on 11/16 will change to PO today. - Gave 1/2 miralax prep slowly. Prep was stopped after the patient has had several BMs.Pain and nausea control in place.  - Advised that she will need to follow up with [facility] post discharge for colonoscopy.  - Diet advanced to soft.   Essential Hypertension - Currently controlled.  - Held Losartan inpatient due to BPs being low. Will restart 11/19. - Blood pressure remains stable (135/63) off anti-HTN - Monitor   DM neuropathy, type II diabetes mellitus:  - AC & HS sliding scale only  - A1c in June notes good control. HbA1C--6.7 - CBG 146 on 11/19  OSA on CPAP: - Continue CPap as per patient.  - Patient has refused cpap.   Depression:  - Continue SSRI - Patient is cheerful today  Hepatic steatosis:  - Incidentally noted on abdominal CT.  -  Outpatient PCP can manage  Obesity: - Patient meets criteria with BMI greater than 30  Abnormal CT of pelvis:  - Adnexal structure seen on CT. Pelvic/transvaginal ultrasound ordered noting likely cystic fluid.  Consider MR of pelvis versus outpatient follow-up. Patient informed.     DVT Prophylaxis:  Lovenox   Code Status: Full  Family Communication: None. Patient alert and agreeable to plan of consult Disposition Plan: Remains inpatient. D/C tomorrow if medically appropriate   Consultants:  None  Procedures:  None  Antibiotics:  Flagyl 11/19>>  Cipro 11/19>>  Objective: Filed Vitals:   05/14/14 0541 05/14/14 1314 05/14/14 2158 05/15/14 0620  BP: 125/60 127/69 125/59 135/63  Pulse: 62 63 64 57  Temp: 98.3 F (36.8 C) 98 F (36.7 C) 97.8 F (36.6 C) 97.9 F (36.6 C)  TempSrc: Oral Oral Oral Oral  Resp: 18 16 18 18   Height:      Weight:      SpO2: 99% 99% 98% 99%    Intake/Output Summary (Last 24 hours) at 05/15/14 1038 Last data filed at 05/15/14 1009  Gross per 24 hour  Intake   3082 ml  Output      0 ml  Net   3082 ml   Filed Weights   05/12/14 0758 05/12/14 1608  Weight: 77.111 kg (170 lb) 80.287 kg (177 lb)    Exam: General: Well developed, well nourished, NAD HEENT:  EOMI, Anicteic Sclera, MMM. Neck: Supple, no JVD  Cardiovascular: RRR, S1 S2 auscultated, no rubs, murmurs or  gallops.   Respiratory: Clear to auscultation bilaterally with equal chest rise  Abdomen: Soft, moderate tenderness in LLQ, slightly distended, + bowel sounds  Neuro: AAOx3, cranial nerves grossly intact. Strength 5/5 in upper and lower extremities  Psych: Normal affect and demeanor with intact judgement and insight   Data Reviewed: Basic Metabolic Panel:  Recent Labs Lab 05/12/14 0810 05/12/14 1725 05/13/14 0633 05/14/14 0617 05/15/14 0545  NA 137  --  138 135* 135*  K 4.0  --  3.4* 4.1 4.0  CL 98  --  98 98 97  CO2 25  --  27 24 22   GLUCOSE 140*  --   100* 129* 124*  BUN 7  --  6 6 7   CREATININE 0.65 0.64 0.69 0.68 0.67  CALCIUM 10.1  --  9.2 9.6 9.5   Liver Function Tests:  Recent Labs Lab 05/12/14 0810  AST 19  ALT 16  ALKPHOS 80  BILITOT 0.2*  PROT 8.0  ALBUMIN 3.8    Recent Labs Lab 05/12/14 0810  LIPASE 35   CBC:  Recent Labs Lab 05/12/14 0810 05/12/14 1725 05/13/14 0633 05/15/14 0545  WBC 11.1* 10.3 7.4 6.1  NEUTROABS 8.1*  --   --   --   HGB 13.2 12.4 11.6* 11.8*  HCT 38.5 37.3 35.2* 35.0*  MCV 91.2 89.4 92.4 90.2  PLT 267 251 250 258   CBG:  Recent Labs Lab 05/14/14 1606 05/14/14 2013 05/15/14 0006 05/15/14 0438 05/15/14 0740  GLUCAP 134* 127* 156* 128* 146*     Studies: No results found.  Scheduled Meds: . aspirin EC  81 mg Oral Daily  . azelastine  2 spray Each Nare BID  . ciprofloxacin  500 mg Oral BID  . diphenhydrAMINE  12.5 mg Oral Q12H  . enoxaparin (LOVENOX) injection  40 mg Subcutaneous Q24H  . gabapentin  600 mg Oral TID  . insulin aspart  0-9 Units Subcutaneous TID WC  . metroNIDAZOLE  500 mg Oral 3 times per day  . OXcarbazepine  225 mg Oral BID   Continuous Infusions: . sodium chloride 10 mL/hr at 05/15/14 0600    Principal Problem:   Diverticulitis large intestine w/o perforation or abscess w/o bleeding Active Problems:   Essential hypertension   DM neuropathy, type II diabetes mellitus   OSA on CPAP   Depression   Seizure disorder   Abnormal CT scan, pelvis   Hepatic steatosis   Obesity (BMI 30-39.9)   Pelvic cyst    Colbert Ewing PA-S [facility] Pager [phone]. If 7PM-7AM, please contact night-coverage at [email or web address], password [facility] 05/15/2014, 10:38 AM  LOS: 3 days    I have directly reviewed the clinical findings, lab, imaging studies and management of this patient in detail. I have interviewed and examined the patient and agree with the documentation,  as recorded by the Physician extender.  Thurnell Lose M.D on 05/15/2014 at 1:46  PM  [facility] Office  [phone]

## 2014-05-16 LAB — GLUCOSE, CAPILLARY
GLUCOSE-CAPILLARY: 258 mg/dL — AB (ref 70–99)
Glucose-Capillary: 144 mg/dL — ABNORMAL HIGH (ref 70–99)

## 2014-05-16 MED ORDER — CIPROFLOXACIN HCL 500 MG PO TABS: 500.0000 mg | ORAL_TABLET | Freq: Two times a day (BID) | ORAL | Status: DC

## 2014-05-16 MED ORDER — POLYETHYLENE GLYCOL 3350 17 GM/SCOOP PO POWD: 17.0000 g | Freq: Once | ORAL | Status: DC

## 2014-05-16 MED ORDER — LORAZEPAM 0.5 MG PO TABS
0.5000 mg | ORAL_TABLET | Freq: Once | ORAL | Status: AC
  Administered 2014-05-16: 0.5 mg via ORAL
  Filled 2014-05-16: qty 1

## 2014-05-16 MED ORDER — METRONIDAZOLE 500 MG PO TABS: 500.0000 mg | ORAL_TABLET | Freq: Three times a day (TID) | ORAL | Status: DC

## 2014-05-16 NOTE — Plan of Care (Signed)
Problem: Phase III Progression Outcomes Goal: IV/normal saline lock discontinued Outcome: Completed/Met Date Met:  05/16/14

## 2014-05-16 NOTE — Discharge Instructions (Signed)
Please see [facility] in 4 weeks as follow up for Diverticulitis (second episode).  Take miralax daily.  1 large capful in 4 ounces of liquid.  If your stools are not soft - then please take it 2x a day.   Pain medications tend to make everyone constipated.  Please talk with your Primary physician or GYN about the loculated cyst in your right pelvis.  It is advisable to have this further evaluated.    You are a pleasure to care for - I wish you and your family all the best.

## 2014-05-16 NOTE — Progress Notes (Signed)
Patient was discharged home by MD order; discharged instructions review and give to patient with care notes and prescriptions; IV DIC; skin intact; patient will be escorted to the car by nurse tech via wheelchair.  

## 2014-05-16 NOTE — Plan of Care (Signed)
Problem: Phase III Progression Outcomes Goal: Pain controlled on oral analgesia Outcome: Completed/Met Date Met:  05/16/14 Goal: Activity at appropriate level-compared to baseline (UP IN CHAIR FOR HEMODIALYSIS)  Outcome: Completed/Met Date Met:  05/16/14 Goal: Discharge plan remains appropriate-arrangements made Outcome: Completed/Met Date Met:  05/16/14  Problem: Discharge Progression Outcomes Goal: Discharge plan in place and appropriate Outcome: Completed/Met Date Met:  05/16/14 Goal: Hemodynamically stable Outcome: Completed/Met Date Met:  46/80/32 Goal: Complications resolved/controlled Outcome: Not Applicable Date Met:  06/19/81 Goal: Activity appropriate for discharge plan Outcome: Completed/Met Date Met:  05/16/14

## 2014-05-16 NOTE — Discharge Summary (Signed)
Physician Discharge Summary  [name] QMV:[id number] DOB: 06-17-1941 DOA: 05/12/2014  PCP: Gwendolyn Grant, MD  Admit date: 05/12/2014 Discharge date: 05/16/2014  Time spent: 35 minutes  Recommendations for Outpatient Follow-up:  1. PCP follow up.  bmet in 2 weeks. 2. Patient d/c'd on cipro / flagyl for second episode of diverticulitis.   3. Please emphasize bowel regimen - once or twice daily miralax for soft stools 4. [facility] follow up in 1 month for possible colonoscopy. 5 .  Loculated cyst found in right pelvis on CT scan - needs further evaluation after diverticulitis has resolved. 6.   Patient with severe hepatic steatosis.  Discharge Diagnoses:  Principal Problem:   Diverticulitis large intestine w/o perforation or abscess w/o bleeding Active Problems:   Essential hypertension   DM neuropathy, type II diabetes mellitus   OSA on CPAP   Depression   Seizure disorder   Abnormal CT scan, pelvis   Hepatic steatosis   Obesity (BMI 30-39.9)   Pelvic cyst   Discharge Condition: stable.  Diet recommendation: heart healthy  Filed Weights   05/12/14 0758 05/12/14 1608  Weight: 77.111 kg (170 lb) 80.287 kg (177 lb)    History of present illness:  73 year old female with a history of seizure disorder, diabetes mellitus, hypertension presented with 4 day history of left lower quadrant abdominal pain. She had some nausea without any diarrhea. The patient had been complaining of constipation, not having moved her bowels in approximately 5 days. She denied any chest pain, shortness breath, coughing, hemoptysis, dysuria, hematuria. No hematochezia or melena. CT of the abdomen and pelvis at the time of admission on 05/12/2014 showed acute diverticulitis of the descending colon without abscess. There is also a 5 cm cystic structure in the right pelvis. Vaginal ultrasound revealed a loculated fluid collection in the right periumbilical midline of the adnexa.   Hospital Course:    Diverticulitis large intestine w/o perforation or abscess w/o bleeding:  Second episode. Patient mentions constipation despite prunes, prune juice and miralax.  She was treated with IV antibiotics (Cipro/Flagyl), for four days and will be discharged on oral cipro/flagyl for another 10 days.  She has been advised to follow up with [facility] post discharge for colonoscopy. Tolerating a soft diet at discharge.  Essential Hypertension Initially the patient was relatively hypotensive due to infection.  As her improved her BP medication was restarted and she will be discharged on losartan as she was taking prior to admission.  DM neuropathy, type II diabetes mellitus:  AC & HS sliding scale only, A1c in June notes good control. HbA1C--6.7  OSA on CPAP: Continue CPap as per patient. Patient refused cpap while inpatient.  Depression:  Continue SSRI  Hepatic steatosis:  Incidentally noted on abdominal CT. Will request her PCP to manage.  Obesity: Patient meets criteria with BMI greater than 30  Abnormal CT of pelvis:  Adnexal structure seen on CT. Pelvic/transvaginal ultrasound ordered noting likely cystic fluid. As diverticulitis resolves, could consider MR of pelvis.  Discharge Exam: Filed Vitals:   05/15/14 0620 05/15/14 1352 05/15/14 2201 05/16/14 0529  BP: 135/63 129/63 135/71 117/72  Pulse: 57 60 71 66  Temp: 97.9 F (36.6 C) 98.3 F (36.8 C)  97.5 F (36.4 C)  TempSrc: Oral Oral    Resp: '18 16 18 18  ' Height:      Weight:      SpO2: 99% 100% 100% 99%   General: Well developed, well nourished, NAD, appears stated age, very  pleasant.  Sitting in recliner. HEENT: PERR, EOMI, Anicteic Sclera, MMM. No pharyngeal erythema or exudates  Neck: Supple, no JVD, no masses  Cardiovascular: RRR, S1 S2 auscultated, no rubs, murmurs or gallops.  Respiratory: Clear to auscultation bilaterally with equal chest rise  Abdomen: Soft, still mildly tender to palpation on the LLQ,  nondistended, + bowel sounds  Extremities: warm dry without cyanosis clubbing or edema.  Neuro: AAOx3, cranial nerves grossly intact. Strength 5/5 in upper and lower extremities  Skin: Without rashes exudates or nodules.  Psych: Normal affect and demeanor with intact judgement and insight    Discharge Instructions   Discharge Instructions    Diet - low sodium heart healthy    Complete by:  As directed      Diet general    Complete by:  As directed   For a few days stick to a soft, bland, diet with a lot of liquids.     Increase activity slowly    Complete by:  As directed           Current Discharge Medication List    START taking these medications   Details  ciprofloxacin (CIPRO) 500 MG tablet Take 1 tablet (500 mg total) by mouth 2 (two) times daily. Qty: 20 tablet, Refills: 0    metroNIDAZOLE (FLAGYL) 500 MG tablet Take 1 tablet (500 mg total) by mouth every 8 (eight) hours. Qty: 30 tablet, Refills: 0    polyethylene glycol powder (GLYCOLAX) powder Take 17 g by mouth once. Take 1 large capful in 4 ounces of liquid (water, coffee, juice) each morning unless your are having diarrhea.  If your stools are not soft - then take it twice a day. Qty: 255 g, Refills: 0      CONTINUE these medications which have NOT CHANGED   Details  ACCU-CHEK AVIVA PLUS test strip Check blood sugar two times daily Qty: 200 each, Refills: 6    albuterol (PROAIR HFA) 108 (90 BASE) MCG/ACT inhaler Inhale 2 puffs into the lungs every 6 (six) hours as needed for wheezing or shortness of breath.     Alpha-D-Galactosidase (BEANO PO) Take 1-2 tablets by mouth daily as needed (for gas).    ALPRAZolam (XANAX) 0.25 MG tablet Take 0.25 mg by mouth 2 (two) times daily as needed for anxiety.     aspirin 81 MG tablet Take 81 mg by mouth daily.      azelastine (ASTELIN) 0.1 % nasal spray Place 2 sprays into both nostrils 2 (two) times daily as needed for rhinitis. Use in each nostril as directed     Biotin 5000 MCG TABS Take 5,000 mcg by mouth daily. Qty: 30 tablet    Black Cohosh 80 MG CAPS Take 80 capsules by mouth daily as needed (for vitamin).    Blood Glucose Monitoring Suppl (ACCU-CHEK AVIVA PLUS) W/DEVICE KIT 1 kit by Does not apply route once. Qty: 1 kit, Refills: 0    Calcium Carbonate (CALCIUM 500 PO) Take 1 capsule by mouth every other day.     Cholecalciferol (EQL VITAMIN D3) 1000 UNITS tablet Take 1,000 Units by mouth daily as needed (for vitamin).     Cyanocobalamin (VITAMIN B 12) 100 MCG LOZG Take 100 mcg by mouth daily as needed (for vitamin).     cyclobenzaprine (FLEXERIL) 5 MG tablet TAKE 1 TABLET (5 MG TOTAL) BY MOUTH 3 (THREE) TIMES DAILY AS NEEDED FOR MUSCLE SPASMS. Qty: 30 tablet, Refills: 1    famotidine (PEPCID) 20 MG tablet Take  1 tablet (20 mg total) by mouth daily. Qty: 90 tablet, Refills: 3    gabapentin (NEURONTIN) 600 MG tablet Take 1 tablet (600 mg total) by mouth 3 (three) times daily. Qty: 270 tablet, Refills: 1    glipiZIDE (GLUCOTROL XL) 5 MG 24 hr tablet TAKE 1 TABLET (5 MG TOTAL) BY MOUTH DAILY. Qty: 90 tablet, Refills: 3    HYDROcodone-acetaminophen (NORCO) 5-325 MG per tablet Take 0.5-1 tablets by mouth every 8 (eight) hours as needed. Qty: 60 tablet, Refills: 0    losartan (COZAAR) 50 MG tablet TAKE 1 TABLET BY MOUTH EVERY DAY Qty: 90 tablet, Refills: 3    meclizine (ANTIVERT) 25 MG tablet Take 1 tablet (25 mg total) by mouth 3 (three) times daily as needed for dizziness or nausea. Qty: 30 tablet, Refills: 0    metFORMIN (GLUCOPHAGE-XR) 500 MG 24 hr tablet Take 1 tablet (500 mg total) by mouth daily with breakfast. Qty: 90 tablet, Refills: 3    Multiple Vitamin (MULTIVITAMIN) capsule Take 1 capsule by mouth daily as needed (for vitamin).     OXcarbazepine (TRILEPTAL) 150 MG tablet TAKE 1 AND 1/2 TABLET BY MOUTH TWICE DAILY Qty: 90 tablet, Refills: 5    PARoxetine (PAXIL) 10 MG tablet Take 10 mg by mouth daily as needed (for  mood).    Probiotic Product (PROBIOTIC PO) Take 1 capsule by mouth daily after breakfast.     traMADol (ULTRAM) 50 MG tablet TAKE 1 TABLET BY MOUTH EVERY 6 HOURS AS NEEDED FOR PAIN Qty: 30 tablet, Refills: 0      STOP taking these medications     fluticasone (FLONASE) 50 MCG/ACT nasal spray      FLUZONE HIGH-DOSE 0.5 ML SUSY      promethazine-codeine (PHENERGAN WITH CODEINE) 6.25-10 MG/5ML syrup        Allergies  Allergen Reactions  . Other     "SEEDED" food due to stomach issues  . Statins Nausea And Vomiting  . Penicillins Hives and Rash   Follow-up Information    Follow up with Gwendolyn Grant, MD. Schedule an appointment as soon as possible for a visit in 1 week.   Specialty:  Internal Medicine   Why:  follow your CT results, follow with your OB MD for possible ovarian mass   Contact information:   [address] [place] [address] [phone]       Follow up with [facility] In 4 weeks.   Why:  Please see your gastroenterologist at [facility] in 1 month.  You may need a colonoscopy.  Your previous endoscopy was done by Dr. Fuller Plan       The results of significant diagnostics from this hospitalization (including imaging, microbiology, ancillary and laboratory) are listed below for reference.    Significant Diagnostic Studies: US Transvaginal Non-ob  05/18/14   CLINICAL DATA:  Followup CT, evaluate for adnexal or ovarian cystic lesion.  EXAM: TRANSABDOMINAL AND TRANSVAGINAL ULTRASOUND OF PELVIS  TECHNIQUE: Both transabdominal and transvaginal ultrasound examinations of the pelvis were performed. Transabdominal technique was performed for global imaging of the pelvis including uterus, ovaries, adnexal regions, and pelvic cul-de-sac. It was necessary to proceed with endovaginal exam following the transabdominal exam to visualize the right adnexa.  COMPARISON:  CT abdomen pelvis 05/18/14.  FINDINGS: Uterus  Surgically absent.   Endometrium  Surgically absent.  Right ovary  Not visualized. A 4.3 x 2.0 x 2.2 cm fluid collection is seen in the right para midline  adnexal region. It has internal echoes. No solid components.  Left ovary  Not visualized.  No adnexal mass.  Other findings  No dependent free fluid.  IMPRESSION: Loculated fluid collection in the right para midline adnexal region is of uncertain etiology. Differential diagnosis includes a postoperative seroma or peritoneal inclusion cyst. Right ovary is not visualized. If further evaluation is desired, MR pelvis without and with contrast is recommended.   Electronically Signed   By: Lorin Picket M.D.   On: 05/12/2014 12:39   US Pelvis Complete  05/12/2014   CLINICAL DATA:  Followup CT, evaluate for adnexal or ovarian cystic lesion.  EXAM: TRANSABDOMINAL AND TRANSVAGINAL ULTRASOUND OF PELVIS  TECHNIQUE: Both transabdominal and transvaginal ultrasound examinations of the pelvis were performed. Transabdominal technique was performed for global imaging of the pelvis including uterus, ovaries, adnexal regions, and pelvic cul-de-sac. It was necessary to proceed with endovaginal exam following the transabdominal exam to visualize the right adnexa.  COMPARISON:  CT abdomen pelvis 05/12/2014.  FINDINGS: Uterus  Surgically absent.  Endometrium  Surgically absent.  Right ovary  Not visualized. A 4.3 x 2.0 x 2.2 cm fluid collection is seen in the right para midline adnexal region. It has internal echoes. No solid components.  Left ovary  Not visualized.  No adnexal mass.  Other findings  No dependent free fluid.  IMPRESSION: Loculated fluid collection in the right para midline adnexal region is of uncertain etiology. Differential diagnosis includes a postoperative seroma or peritoneal inclusion cyst. Right ovary is not visualized. If further evaluation is desired, MR pelvis without and with contrast is recommended.   Electronically Signed   By: Lorin Picket M.D.   On: 05/12/2014 12:39    Ct Abdomen Pelvis W Contrast  05/12/2014   CLINICAL DATA:  Four day history of left lower quadrant abdominal pain and nausea. Chronic constipation which has worsened in the past week. Current history of diverticulosis, diabetes and hypertension. Prior history of diverticulitis. Surgical history includes cholecystectomy, appendectomy, hysterectomy and hernia repair.  EXAM: CT ABDOMEN AND PELVIS WITH CONTRAST  TECHNIQUE: Multidetector CT imaging of the abdomen and pelvis was performed using the standard protocol following bolus administration of intravenous contrast.  CONTRAST:  173m OMNIPAQUE IOHEXOL 300 MG/ML IV. Oral contrast was also administered.  COMPARISON:  CT abdomen and pelvis 09/13/2012, 04/15/2010, 09/10/2007.  FINDINGS: Thickening of the wall of the distal descending colon at its junction with the sigmoid colon, associated with pericolonic edema/inflammation. No extraluminal gas. No abnormal fluid collection. Extensive diverticulosis involving the descending and sigmoid colon, with scattered diverticula throughout the remainder of the colon. No acute inflammatory changes elsewhere. Stomach and small bowel normal in appearance. No ascites. No free intraperitoneal air.  Severe diffuse hepatic steatosis without focal hepatic parenchymal abnormality. Gallbladder surgically absent. No unexpected biliary ductal dilation. Normal appearing spleen, pancreas, and right adrenal gland. Approximate 1.6 x 1.3 x 2.5 cm left adrenal nodule of mixed attenuation, unchanged dating back to 2009. Normal-appearing kidneys. Moderate to severe aortoiliofemoral atherosclerosis without aneurysm. No significant lymphadenopathy.  Uterus surgically absent. Cystic structure low in the right side of the pelvis, adjacent to the rectum, containing a punctate calcification in its wall, measuring approximately 2.9 x 2.6 x 4.9 cm, contiguous with the right vaginal fornix. Urinary bladder unremarkable. Phleboliths low in both sides of  the pelvis.  Bone window images demonstrate severe lower thoracic and lumbar spondylosis, degenerative disc disease diffusely throughout the lumbar spine, facet degenerative changes diffusely throughout the lumbar spine, and moderate  degenerative changes in both hips. Enthesopathic calcification is present at multiple muscular tendinous insertion sites in the pelvis and the greater trochanters of both femurs.  IMPRESSION: 1. Acute diverticulitis involving the distal descending colon at its junction with the sigmoid colon. No evidence of abscess or perforation. 2. Severe diffuse hepatic steatosis without focal hepatic parenchymal abnormality. 3. Stable left adrenal adenoma. 4. Approximate 5 cm cystic structure low in the right side of the pelvis. If the patient has her right ovary, this may represent an ovarian cyst. If not, it may represent a duplication cyst arising from the rectum. Please correlate with surgical history. If this is indeed a right ovarian cyst, this is a a suspicious finding in this postmenopausal patient an ultrasound would be suggested in further evaluation. This recommendation follows ACR consensus guidelines: White Paper of the ACR Incidental Findings Committee II on Adnexal Findings. J Am Coll Radiol [phone].   Electronically Signed   By: Evangeline Dakin M.D.   On: 05/12/2014 10:31    Labs: Basic Metabolic Panel:  Recent Labs Lab 05/12/14 0810 05/12/14 1725 05/13/14 0633 05/14/14 0617 05/15/14 0545  NA 137  --  138 135* 135*  K 4.0  --  3.4* 4.1 4.0  CL 98  --  98 98 97  CO2 25  --  '27 24 22  ' GLUCOSE 140*  --  100* 129* 124*  BUN 7  --  '6 6 7  ' CREATININE 0.65 0.64 0.69 0.68 0.67  CALCIUM 10.1  --  9.2 9.6 9.5   Liver Function Tests:  Recent Labs Lab 05/12/14 0810  AST 19  ALT 16  ALKPHOS 80  BILITOT 0.2*  PROT 8.0  ALBUMIN 3.8    Recent Labs Lab 05/12/14 0810  LIPASE 35  CBC:  Recent Labs Lab 05/12/14 0810 05/12/14 1725 05/13/14 0633  05/15/14 0545  WBC 11.1* 10.3 7.4 6.1  NEUTROABS 8.1*  --   --   --   HGB 13.2 12.4 11.6* 11.8*  HCT 38.5 37.3 35.2* 35.0*  MCV 91.2 89.4 92.4 90.2  PLT 267 251 250 258   CBG:  Recent Labs Lab 05/15/14 0740 05/15/14 1155 05/15/14 1608 05/15/14 2158 05/16/14 0736  GLUCAP 146* 181* 174* 122* 144*       Signed:  Melton Alar, PA-C  [facility] 05/16/2014, 10:40 AM

## 2014-05-16 NOTE — Care Management Note (Signed)
    Page 1 of 1   05/16/2014     1:36:33 PM CARE MANAGEMENT NOTE 05/16/2014  Patient:  [name], [name]   Account Number:  [id number]  Date Initiated:  05/16/2014  Documentation initiated by:  McNally,Kristin  Subjective/Objective Assessment:   pt admit with diverticulitis     Action/Plan:   pt from home   Anticipated DC Date:  05/16/2014   Anticipated DC Plan:  HOME/SELF CARE         Choice offered to / List presented to:             Status of service:  Completed, signed off Medicare Important Message given?   (If response is "NO", the following Medicare IM given date fields will be blank) Date Medicare IM given:   Medicare IM given by:   Date Additional Medicare IM given:   Additional Medicare IM given by:    Discharge Disposition:    Per UR Regulation:    If discussed at Long Length of Stay Meetings, dates discussed:    Comments:  05/16/14 kristin mcnally rn pt admit with diverticilitis. Anticipated dc home today with no needs.

## 2014-05-26 NOTE — Addendum Note (Signed)
Addended by: Lowella Dandy on: 05/26/2014 12:19 PM   Modules accepted: Orders

## 2014-06-02 ENCOUNTER — Telehealth: Payer: Self-pay | Admitting: Internal Medicine

## 2014-06-02 ENCOUNTER — Other Ambulatory Visit: Payer: Self-pay | Admitting: Internal Medicine

## 2014-06-02 DIAGNOSIS — Z1231 Encounter for screening mammogram for malignant neoplasm of breast: Secondary | ICD-10-CM

## 2014-06-02 DIAGNOSIS — Z13820 Encounter for screening for osteoporosis: Secondary | ICD-10-CM

## 2014-06-02 NOTE — Telephone Encounter (Signed)
Pt called in and wanted to see if Dr Asa Lente could put in a order for her to have a Bone Density at [facility] [place] same day she is having breast exam.    She said that we could fax order to [phone]

## 2014-06-02 NOTE — Telephone Encounter (Signed)
Tried to call pt to confirm a time was acceptable. Not able to reach. Referral and Appointment for Dexa 06/16/14 at 1:45pm done.

## 2014-06-10 ENCOUNTER — Other Ambulatory Visit: Payer: Self-pay | Admitting: Internal Medicine

## 2014-06-11 ENCOUNTER — Encounter: Payer: Self-pay | Admitting: Family

## 2014-06-11 ENCOUNTER — Ambulatory Visit (INDEPENDENT_AMBULATORY_CARE_PROVIDER_SITE_OTHER): Payer: Medicare Other | Admitting: Family

## 2014-06-11 VITALS — BP 138/74 | HR 70 | Temp 97.9°F | Resp 18 | Ht 62.0 in | Wt 180.2 lb

## 2014-06-11 DIAGNOSIS — M792 Neuralgia and neuritis, unspecified: Secondary | ICD-10-CM

## 2014-06-11 DIAGNOSIS — E1140 Type 2 diabetes mellitus with diabetic neuropathy, unspecified: Secondary | ICD-10-CM

## 2014-06-11 MED ORDER — KETOROLAC TROMETHAMINE 60 MG/2ML IM SOLN
60.0000 mg | Freq: Once | INTRAMUSCULAR | Status: AC
  Administered 2014-06-11: 60 mg via INTRAMUSCULAR

## 2014-06-11 MED ORDER — HYDROCODONE-ACETAMINOPHEN 5-325 MG PO TABS: 0.5000 | ORAL_TABLET | Freq: Three times a day (TID) | ORAL | Status: DC | PRN

## 2014-06-11 NOTE — Progress Notes (Signed)
Pre visit review using our clinic review tool, if applicable. No additional management support is needed unless otherwise documented below in the visit note. 

## 2014-06-11 NOTE — Patient Instructions (Signed)
Thank you for choosing [facility].  Summary/Instructions:  Your prescription(s) have been submitted to your pharmacy. Please take as directed and contact our office if you believe you are having problem(s) with the medication(s).  Please stop by the lab on the basement level of the building for your blood work. Your results will be released to Outlook (or called to you) after review, usually within 72hours after test completion. If any changes need to be made, you will be notified at that same time.  If your symptoms worsen or fail to improve, please contact our office for further instruction, or in case of emergency go directly to the emergency room at the closest medical facility.   Neuropathic Pain We often think that pain has a physical cause. If we get rid of the cause, the pain should go away. Nerves themselves can also cause pain. It is called neuropathic pain, which means nerve abnormality. It may be difficult for the patients who have it and for the treating caregivers. Pain is usually described as acute (short-lived) or chronic (long-lasting). Acute pain is related to the physical sensations caused by an injury. It can last from a few seconds to many weeks, but it usually goes away when normal healing occurs. Chronic pain lasts beyond the typical healing time. With neuropathic pain, the nerve fibers themselves may be damaged or injured. They then send incorrect signals to other pain centers. The pain you feel is real, but the cause is not easy to find.  CAUSES  Chronic pain can result from diseases, such as diabetes and shingles (an infection related to chickenpox), or from trauma, surgery, or amputation. It can also happen without any known injury or disease. The nerves are sending pain messages, even though there is no identifiable cause for such messages.   Other common causes of neuropathy include diabetes, phantom limb pain, or Regional Pain Syndrome (RPS).  As with all forms  of chronic back pain, if neuropathy is not correctly treated, there can be a number of associated problems that lead to a downward cycle for the patient. These include depression, sleeplessness, feelings of fear and anxiety, limited social interaction and inability to do normal daily activities or work.  The most dramatic and mysterious example of neuropathic pain is called "phantom limb syndrome." This occurs when an arm or a leg has been removed because of illness or injury. The brain still gets pain messages from the nerves that originally carried impulses from the missing limb. These nerves now seem to misfire and cause troubling pain.  Neuropathic pain often seems to have no cause. It responds poorly to standard pain treatment. Neuropathic pain can occur after:  Shingles (herpes zoster virus infection).  A lasting burning sensation of the skin, caused usually by injury to a peripheral nerve.  Peripheral neuropathy which is widespread nerve damage, often caused by diabetes or alcoholism.  Phantom limb pain following an amputation.  Facial nerve problems (trigeminal neuralgia).  Multiple sclerosis.  Reflex sympathetic dystrophy.  Pain which comes with cancer and cancer chemotherapy.  Entrapment neuropathy such as when pressure is put on a nerve such as in carpal tunnel syndrome.  Back, leg, and hip problems (sciatica).  Spine or back surgery.  HIV Infection or AIDS where nerves are infected by viruses. Your caregiver can explain items in the above list which may apply to you. SYMPTOMS  Characteristics of neuropathic pain are:  Severe, sharp, electric shock-like, shooting, lightening-like, knife-like.  Pins and needles sensation.  Deep  burning, deep cold, or deep ache.  Persistent numbness, tingling, or weakness.  Pain resulting from light touch or other stimulus that would not usually cause pain.  Increased sensitivity to something that would normally cause pain, such  as a pinprick. Pain may persist for months or years following the healing of damaged tissues. When this happens, pain signals no longer sound an alarm about current injuries or injuries about to happen. Instead, the alarm system itself is not working correctly.  Neuropathic pain may get worse instead of better over time. For some people, it can lead to serious disability. It is important to be aware that severe injury in a limb can occur without a proper, protective pain response.Burns, cuts, and other injuries may go unnoticed. Without proper treatment, these injuries can become infected or lead to further disability. Take any injury seriously, and consult your caregiver for treatment. DIAGNOSIS  When you have a pain with no known cause, your caregiver will probably ask some specific questions:   Do you have any other conditions, such as diabetes, shingles, multiple sclerosis, or HIV infection?  How would you describe your pain? (Neuropathic pain is often described as shooting, stabbing, burning, or searing.)  Is your pain worse at any time of the day? (Neuropathic pain is usually worse at night.)  Does the pain seem to follow a certain physical pathway?  Does the pain come from an area that has missing or injured nerves? (An example would be phantom limb pain.)  Is the pain triggered by minor things such as rubbing against the sheets at night? These questions often help define the type of pain involved. Once your caregiver knows what is happening, treatment can begin. Anticonvulsant, antidepressant drugs, and various pain relievers seem to work in some cases. If another condition, such as diabetes is involved, better management of that disorder may relieve the neuropathic pain.  TREATMENT  Neuropathic pain is frequently long-lasting and tends not to respond to treatment with narcotic type pain medication. It may respond well to other drugs such as antiseizure and antidepressant medications.  Usually, neuropathic problems do not completely go away, but partial improvement is often possible with proper treatment. Your caregivers have large numbers of medications available to treat you. Do not be discouraged if you do not get immediate relief. Sometimes different medications or a combination of medications will be tried before you receive the results you are hoping for. See your caregiver if you have pain that seems to be coming from nowhere and does not go away. Help is available.  SEEK IMMEDIATE MEDICAL CARE IF:   There is a sudden change in the quality of your pain, especially if the change is on only one side of the body.  You notice changes of the skin, such as redness, black or purple discoloration, swelling, or an ulcer.  You cannot move the affected limbs. Document Released: 03/10/2004 Document Revised: 09/05/2011 Document Reviewed: 03/10/2004 [facility] Patient Information 2015 [place], [place]. This information is not intended to replace advice given to you by your health care provider. Make sure you discuss any questions you have with your health care provider.

## 2014-06-11 NOTE — Progress Notes (Signed)
Subjective:    Patient ID: [name], female    DOB: Oct 10, 1940, 73 y.o.   MRN: [id number]  Chief Complaint  Patient presents with  . Follow-up    medication questions and diabetes    HPI:  [name] is a 73 y.o. female who presents today for follow up.  1) Diabetes - currently maintained on metformin and glipizide. Denies any changes to vision. Diabetic eye exam has been completed for this year. She is due for a foot exam. Blood sugars at home have been running in the area of.  Lab Results  Component Value Date   HGBA1C 6.7* 12/24/2013   Lab Results  Component Value Date   CREATININE 0.67 05/15/2014   2) Neuropathic pain - indicates continued chronic neuropathic pain located throughout her body. She is maintained on gabapentin and oxcarbazepine which she indicates it is not helping as much as it used to. She also indicates pain in multiple joints and being stiff in the morning. She would like to know if any additional pain medications can be used to assist in managing her neuropathic pain.   Allergies  Allergen Reactions  . Other     "SEEDED" food due to stomach issues  . Statins Nausea And Vomiting  . Penicillins Hives and Rash    Current Outpatient Prescriptions on File Prior to Visit  Medication Sig Dispense Refill  . ACCU-CHEK AVIVA PLUS test strip Check blood sugar two times daily 200 each 6  . albuterol (PROAIR HFA) 108 (90 BASE) MCG/ACT inhaler Inhale 2 puffs into the lungs every 6 (six) hours as needed for wheezing or shortness of breath.     . Alpha-D-Galactosidase (BEANO PO) Take 1-2 tablets by mouth daily as needed (for gas).    . ALPRAZolam (XANAX) 0.25 MG tablet Take 0.25 mg by mouth 2 (two) times daily as needed for anxiety.     Marland Kitchen aspirin 81 MG tablet Take 81 mg by mouth daily.      Marland Kitchen azelastine (ASTELIN) 0.1 % nasal spray Place 2 sprays into both nostrils 2 (two) times daily as needed for rhinitis. Use in each nostril as directed    . Biotin 5000  MCG TABS Take 5,000 mcg by mouth daily. 30 tablet   . Black Cohosh 80 MG CAPS Take 80 capsules by mouth daily as needed (for vitamin).    . Blood Glucose Monitoring Suppl (ACCU-CHEK AVIVA PLUS) W/DEVICE KIT 1 kit by Does not apply route once. 1 kit 0  . Calcium Carbonate (CALCIUM 500 PO) Take 1 capsule by mouth every other day.     . Cholecalciferol (EQL VITAMIN D3) 1000 UNITS tablet Take 1,000 Units by mouth daily as needed (for vitamin).     . ciprofloxacin (CIPRO) 500 MG tablet Take 1 tablet (500 mg total) by mouth 2 (two) times daily. 20 tablet 0  . Cyanocobalamin (VITAMIN B 12) 100 MCG LOZG Take 100 mcg by mouth daily as needed (for vitamin).     . cyclobenzaprine (FLEXERIL) 5 MG tablet TAKE 1 TABLET (5 MG TOTAL) BY MOUTH 3 (THREE) TIMES DAILY AS NEEDED FOR MUSCLE SPASMS. 30 tablet 1  . famotidine (PEPCID) 20 MG tablet Take 1 tablet (20 mg total) by mouth daily. (Patient taking differently: Take 20 mg by mouth daily before supper. ) 90 tablet 3  . gabapentin (NEURONTIN) 600 MG tablet Take 1 tablet (600 mg total) by mouth 3 (three) times daily. 270 tablet 1  . glipiZIDE (GLUCOTROL XL) 5  MG 24 hr tablet TAKE 1 TABLET (5 MG TOTAL) BY MOUTH DAILY. 90 tablet 3  . losartan (COZAAR) 50 MG tablet TAKE 1 TABLET BY MOUTH EVERY DAY 90 tablet 3  . meclizine (ANTIVERT) 25 MG tablet Take 1 tablet (25 mg total) by mouth 3 (three) times daily as needed for dizziness or nausea. 30 tablet 0  . metFORMIN (GLUCOPHAGE-XR) 500 MG 24 hr tablet Take 1 tablet (500 mg total) by mouth daily with breakfast. 90 tablet 3  . metroNIDAZOLE (FLAGYL) 500 MG tablet Take 1 tablet (500 mg total) by mouth every 8 (eight) hours. 30 tablet 0  . Multiple Vitamin (MULTIVITAMIN) capsule Take 1 capsule by mouth daily as needed (for vitamin).     . OXcarbazepine (TRILEPTAL) 150 MG tablet TAKE 1 AND 1/2 TABLET BY MOUTH TWICE DAILY 90 tablet 5  . PARoxetine (PAXIL) 10 MG tablet Take 10 mg by mouth daily as needed (for mood).    .  polyethylene glycol powder (GLYCOLAX/MIRALAX) powder Take 17 g by mouth daily. 500 g 0  . Probiotic Product (PROBIOTIC PO) Take 1 capsule by mouth daily after breakfast.     . traMADol (ULTRAM) 50 MG tablet TAKE 1 TABLET BY MOUTH EVERY 6 HOURS AS NEEDED FOR PAIN 30 tablet 0   No current facility-administered medications on file prior to visit.    Review of Systems    See HPI  Objective:    BP 138/74 mmHg  Pulse 70  Temp(Src) 97.9 F (36.6 C) (Oral)  Resp 18  Ht '5\' 2"'  (1.575 m)  Wt 180 lb 3.2 oz (81.738 kg)  BMI 32.95 kg/m2  SpO2 98%  Nursing note and vital signs reviewed.  Physical Exam  Constitutional: She is oriented to person, place, and time. She appears well-developed and well-nourished. No distress.  Cardiovascular: Normal rate, regular rhythm, normal heart sounds and intact distal pulses.   Pulmonary/Chest: Effort normal and breath sounds normal.  Musculoskeletal:  Diabetic foot exam-bilateral feet are free from skin breakdown. Dorsalis and pedis pulses are present and intact. Sensation is decreased in bilateral toes to monofilament.  Neurological: She is alert and oriented to person, place, and time.  Skin: Skin is warm and dry.  Psychiatric: She has a normal mood and affect. Her behavior is normal. Judgment and thought content normal.       Assessment & Plan:

## 2014-06-11 NOTE — Assessment & Plan Note (Signed)
Diabetes appears stable. Diabetic foot exam reveals neuropathy. Cannot rule out generalized neuropathy from diabetic causes. Pain currently managed with gabapentin and oxcarbazepine. Continue current medications for neuropathic pain with the adjustments. Refer to neurology for assistance with her neuropathic pain management. Continue metformin and glipizide at current doses.  Other joint pain as described by the patient consistent with osteoarthritis. Will refill one time Norco. Discussed risks and benefits of Norco use and increased fall risk. Patient would like to continue with the medication. Patient also requesting a pain shot. In office ketorolac given.

## 2014-06-16 ENCOUNTER — Ambulatory Visit (HOSPITAL_COMMUNITY)
Admission: RE | Admit: 2014-06-16 | Discharge: 2014-06-16 | Disposition: A | Discharge status: Home / Self Care | Payer: Medicare Other | Source: Ambulatory Visit | Attending: Internal Medicine | Admitting: Internal Medicine

## 2014-06-16 DIAGNOSIS — Z1231 Encounter for screening mammogram for malignant neoplasm of breast: Secondary | ICD-10-CM | POA: Diagnosis present

## 2014-06-16 DIAGNOSIS — Z13820 Encounter for screening for osteoporosis: Secondary | ICD-10-CM

## 2014-06-17 ENCOUNTER — Other Ambulatory Visit: Payer: Self-pay | Admitting: Internal Medicine

## 2014-06-17 DIAGNOSIS — R928 Other abnormal and inconclusive findings on diagnostic imaging of breast: Secondary | ICD-10-CM

## 2014-06-24 ENCOUNTER — Other Ambulatory Visit: Payer: Self-pay | Admitting: Internal Medicine

## 2014-06-24 DIAGNOSIS — E119 Type 2 diabetes mellitus without complications: Secondary | ICD-10-CM

## 2014-06-25 ENCOUNTER — Ambulatory Visit
Admission: RE | Admit: 2014-06-25 | Discharge: 2014-06-25 | Disposition: A | Discharge status: Home / Self Care | Payer: 59 | Source: Ambulatory Visit | Attending: Internal Medicine | Admitting: Internal Medicine

## 2014-06-25 DIAGNOSIS — R928 Other abnormal and inconclusive findings on diagnostic imaging of breast: Secondary | ICD-10-CM

## 2014-07-04 ENCOUNTER — Other Ambulatory Visit: Payer: Self-pay | Admitting: Internal Medicine

## 2014-07-15 ENCOUNTER — Other Ambulatory Visit: Payer: Self-pay | Admitting: Internal Medicine

## 2014-07-24 LAB — HM DIABETES EYE EXAM

## 2014-07-28 ENCOUNTER — Encounter: Payer: Self-pay | Admitting: Neurology

## 2014-07-28 ENCOUNTER — Ambulatory Visit (INDEPENDENT_AMBULATORY_CARE_PROVIDER_SITE_OTHER): Payer: Medicare Other | Admitting: Neurology

## 2014-07-28 VITALS — BP 126/76 | HR 62 | Ht 60.0 in | Wt 179.2 lb

## 2014-07-28 DIAGNOSIS — G40909 Epilepsy, unspecified, not intractable, without status epilepticus: Secondary | ICD-10-CM

## 2014-07-28 DIAGNOSIS — Z79899 Other long term (current) drug therapy: Secondary | ICD-10-CM

## 2014-07-28 DIAGNOSIS — E1140 Type 2 diabetes mellitus with diabetic neuropathy, unspecified: Secondary | ICD-10-CM

## 2014-07-28 MED ORDER — GABAPENTIN 800 MG PO TABS: 800.0000 mg | ORAL_TABLET | Freq: Three times a day (TID) | ORAL | Status: DC

## 2014-07-28 MED ORDER — LIDOCAINE 5 % EX OINT: 1.0000 "application " | TOPICAL_OINTMENT | Freq: Two times a day (BID) | CUTANEOUS | Status: DC | PRN

## 2014-07-28 NOTE — Patient Instructions (Addendum)
1.  Increase gabapentin 300mg  as follows:   AM  Afternoon  PM      Week 1 600mg   600mg    900mg  (3 tablets)      Week 2 900mg   600mg    900mg        Week 3 800mg   800mg    800mg  - start taking 800mg  tablets  2.  Apply lidocaine ointment to feet bilaterally; remember to use gloves or cotton swab when applying  3.  OK to resume driving per [place] law since you have been seizure free for 46-months  4.  Return to clinic 90-months  [facility]  Preventing Falls in the [place] are common, often dreaded events in the lives of older people. Aside from the obvious injuries and even death that may result, falls can cause wide-ranging consequences including loss of independence, mental decline, decreased activity, and mobility. Younger people are also at risk of falling, especially those with chronic illnesses and fatigue.  Ways to reduce the risk for falling:  * Examine diet and medications. Warm foods and alcohol dilate blood vessels, which can lead to dizziness when standing. Sleep aids, antidepressants, and pain medications can also increase the likelihood of a fall.  * Get a vison exam. Poor vision, cataracts, and glaucoma increase the chances of falling.  * Check foot gear. Shoes should fit snugly and have a sturdy, nonskid sole and broad, low heel.  * Participate in a physician-approved exercise program to build and maintain muscle strength and improve balance and coordination.  * Increase vitamin D intake. Vitamin D improves muscle strength and increases the amount of calcium the body is able to absorb and deposit in bones.  How to prevent falls from common hazards:  * Floors - Remove all loose wires, cords, and throw rugs. Minimize clutter. Make sure rugs are anchored and smooth. Keep furniture in its usual place.  * Chairs - Use chairs with straight backs, armrests, and firm seats. Add firm cushions to existing pieces to add height.  * Bathroom - Install grab bars and non-skid tape in  the tub or shower. Use a bathtub transfer bench or a shower chair with a back support. Use an elevated toilet seat and/or safety rails to assist standing from a low surface. Do not use towel racks or bathroom tissue holders to help you stand.  * Lighting - Make sure halls, stairways, and entrances are well-lit. Install a night light in your bathroom or hallway. Make sure there is a light switch at the top and bottom of the staircase. Turn lights on if you get up in the middle of the night. Make sure lamps or light switches are within reach of the bed if you have to get up during the night.  * Kitchen - Install non-skid rubber mats near the sink and stove. Clean spills immediately. Store frequently used utensils, pots, and pans between waist and eye level. This helps prevent reaching and bending. Sit when getting things out of the lower cupboards.  * Living room / [place] furniture with wide spaces in between, giving enough room to move around. Establish a route through the living room that gives you something to hold onto as you walk.  * Stairs - Make sure treads, rails, and rugs are secure. Install a rail on both sides of the stairs. If stairs are a threat, it might be helpful to arrange most of your activities on the lower level to reduce the number  of times you must climb the stairs.  * Entrances and doorways - Install metal handles on the walls adjacent to the doorknobs of all doors to make it more secure as you travel through the doorway.  Tips for maintaining balance:  * Keep at least one hand free at all times Try using a backpack or fanny pack to hold things rather than carrying them in your hands. Never carry objects in both hands when walking as this interferes with keeping your balance.  * Attempt to swing both arms from front to back while walking. This might require a conscious effort if Parkinson's disease has diminished your movement. It will, however, help you to maintain balance and  posture, and reduce fatigue.  * Consciously lift your feet off the ground when walking. Shuffling and dragging of the feet is a common culprit in losing your balance.  * When trying to navigate turns, use a "U" technique of facing forward and making a wide turn, rather than pivoting sharply.  * Try to stand with your feet shoulder-length apart. When your feet are close together for any length of time, you increase your risk of losing your balance and falling.  * Do one thing at a time. Do not try to walk and accomplish another task, such as reading or looking around. The decrease in your automatic reflexes complicates motor function, so the less distraction, the better.  * Do not wear rubber or gripping soled shoes, they might "catch" on the floor and cause tripping.  * Move slowly when changing positions. Use deliberate, concentrated movements and, if needed, use a grab bar or walking aid. Count fifteen (15) seconds after standing to begin walking.  * If balance is a continuous problem, you might want to consider a walking aid such as a cane, walking stick, or walker. Once you have mastered walking with help, you may be ready to try it again on your own.  This information is provided by [facility] and is not intended to replace the medical advice of your physician or other health care providers. Please consult your physician or other health care providers for advice regarding your specific medical condition.

## 2014-07-28 NOTE — Progress Notes (Signed)
[facility] Clinic Note - Initial Visit   Date: 07/28/2014   MARIALY URBANCZYK MRN: [id number] DOB: 1940/08/12   Dear Dr. Elna Breslow:   Thank you for your kind referral of DENAISHA SWANGO for consultation of diabetic neuropathy. Although her history is well known to you, please allow [place] to reiterate it for the purpose of our medical record. The patient was accompanied to the clinic by self.    History of Present Illness: AARALYNN SHEPHEARD is a 74 y.o. right-handed African American female with well controlled diabetes mellitus (HbA1c 6.7), unspecified seizure disorder (diagnosed 2009, well-controlled on OXC), hypertension, GERD, depression/anxiety, and osteoarthritis presenting for evaluation of paresthesias of the hands and feet.    She has been diabetic for about 15 years and was initially taking insulin, but since has been maintained on oral medications.  Starting around ~ 8 years ago, she noticed numbness and tingling of her toes.  Over the past three years, she noticed numbness/tingling of the feet, ankles, and lower legs.  On rare occasions she has burning sensation of the face.  She is taking gabapentin 638m three times daily which seems to help, but does not completely alleviate the pain.  Symptoms have worsened in past several years.  In 2013, she underwent EMG of the upper which showed bilateral CTS.  Starting in 2009, she started having rare spells of noctural generalized seizures with her last seizure in 2014.  She does not recall any details because she looses consciousness.  His husband tells her that she is shaking and biting her tongue.  No history of urinary or bowel incontinence.  Duration is ~ 5 min.  When she is awake, she is is tired and confused.  She is not driving.  She is currently taking oxcarbazepine 2275mtwice daily and denies any side effects.  No family history of seizures.   Out-side paper records, electronic medical record, and images have been  reviewed where available and summarized as:  Lab Results  Component Value Date   HGBA1C 6.7* 12/24/2013   Lab Results  Component Value Date   [id number] 9896/30/2015   Lab Results  Component Value Date   TSH 2.13 12/24/2013   EMG of the upper extremities and left lower 02/20/2012:  Bilateral mild carpal tunnel syndrome, no evidence of neuropathy or cervical radiculopathy or lumbosacral radiculopathy MRI/A brain wo contrast 10/04/2006:  Atrophy and small vessel disease, no stenosis   Past Medical History  Diagnosis Date  . Diabetes mellitus, type 2   . GERD (gastroesophageal reflux disease)   . Depression   . Seizure disorder   . Dyslipidemia   . OSA on CPAP   . Osteoarthritis of shoulder region     and Knee  . Anxiety   . Diverticulosis of colon 03/2010 hosp  . Hypertension   . Cerebrovascular disease, unspecified 03/13/2013    Atrophy and small vessel dz noted, MR brain 2009  . Allergic rhinitis, cause unspecified 03/13/2013  . Neuropathy     Past Surgical History  Procedure Laterality Date  . Cholecystectomy    . Abdominal hysterectomy  1970's    Partial  . Appendectomy    . Tonsillectomy and adenoidectomy    . Shoulder surgery  2008    LT, post fall      Medications:  Current Outpatient Prescriptions on File Prior to Visit  Medication Sig Dispense Refill  . ACCU-CHEK AVIVA PLUS test strip Check blood sugar two times daily 200 each  6  . albuterol (PROAIR HFA) 108 (90 BASE) MCG/ACT inhaler Inhale 2 puffs into the lungs every 6 (six) hours as needed for wheezing or shortness of breath.     . Alpha-D-Galactosidase (BEANO PO) Take 1-2 tablets by mouth daily as needed (for gas).    . ALPRAZolam (XANAX) 0.25 MG tablet Take 0.25 mg by mouth 2 (two) times daily as needed for anxiety.     Marland Kitchen aspirin 81 MG tablet Take 81 mg by mouth daily.      Marland Kitchen azelastine (ASTELIN) 0.1 % nasal spray Place 2 sprays into both nostrils 2 (two) times daily as needed for rhinitis. Use in each  nostril as directed    . Biotin 5000 MCG TABS Take 5,000 mcg by mouth daily. 30 tablet   . Black Cohosh 80 MG CAPS Take 80 capsules by mouth daily as needed (for vitamin).    . Blood Glucose Monitoring Suppl (ACCU-CHEK AVIVA PLUS) W/DEVICE KIT 1 kit by Does not apply route once. 1 kit 0  . Calcium Carbonate (CALCIUM 500 PO) Take 1 capsule by mouth every other day.     . Cholecalciferol (EQL VITAMIN D3) 1000 UNITS tablet Take 1,000 Units by mouth daily as needed (for vitamin).     . ciprofloxacin (CIPRO) 500 MG tablet Take 1 tablet (500 mg total) by mouth 2 (two) times daily. 20 tablet 0  . Cyanocobalamin (VITAMIN B 12) 100 MCG LOZG Take 100 mcg by mouth daily as needed (for vitamin).     . cyclobenzaprine (FLEXERIL) 5 MG tablet TAKE 1 TABLET (5 MG TOTAL) BY MOUTH 3 (THREE) TIMES DAILY AS NEEDED FOR MUSCLE SPASMS. 30 tablet 1  . famotidine (PEPCID) 20 MG tablet Take 1 tablet (20 mg total) by mouth daily. (Patient taking differently: Take 20 mg by mouth daily before supper. ) 90 tablet 3  . glipiZIDE (GLUCOTROL XL) 5 MG 24 hr tablet TAKE 1 TABLET (5 MG TOTAL) BY MOUTH DAILY. 90 tablet 3  . HYDROcodone-acetaminophen (NORCO) 5-325 MG per tablet Take 0.5-1 tablets by mouth every 8 (eight) hours as needed. 60 tablet 0  . losartan (COZAAR) 50 MG tablet TAKE 1 TABLET BY MOUTH EVERY DAY 30 tablet 5  . meclizine (ANTIVERT) 25 MG tablet TAKE 1 TABLET (25 MG TOTAL) BY MOUTH 3 (THREE) TIMES DAILY AS NEEDED FOR DIZZINESS OR NAUSEA. 30 tablet 0  . metFORMIN (GLUCOPHAGE-XR) 500 MG 24 hr tablet Take 1 tablet (500 mg total) by mouth daily with breakfast. 90 tablet 3  . metroNIDAZOLE (FLAGYL) 500 MG tablet Take 1 tablet (500 mg total) by mouth every 8 (eight) hours. 30 tablet 0  . Multiple Vitamin (MULTIVITAMIN) capsule Take 1 capsule by mouth daily as needed (for vitamin).     . OXcarbazepine (TRILEPTAL) 150 MG tablet TAKE 1 AND 1/2 TABLET BY MOUTH TWICE DAILY 90 tablet 5  . PARoxetine (PAXIL) 10 MG tablet Take 10  mg by mouth daily as needed (for mood).    . polyethylene glycol powder (GLYCOLAX/MIRALAX) powder Take 17 g by mouth daily. 500 g 0  . Probiotic Product (PROBIOTIC PO) Take 1 capsule by mouth daily after breakfast.     . traMADol (ULTRAM) 50 MG tablet TAKE 1 TABLET BY MOUTH EVERY 6 HOURS AS NEEDED FOR PAIN 30 tablet 0   No current facility-administered medications on file prior to visit.    Allergies:  Allergies  Allergen Reactions  . Other     "SEEDED" food due to stomach issues  . Statins  Nausea And Vomiting  . Penicillins Hives and Rash    Family History: Family History  Problem Relation Age of Onset  . Arthritis Mother   . Heart disease Father   . Arthritis Other     Grandmother  . Diabetes Other     Grandmother    Social History: History   Social History  . Marital Status: Married    Spouse Name: N/A    Number of Children: N/A  . Years of Education: N/A   Occupational History  . Not on file.   Social History Main Topics  . Smoking status: Former Smoker    Quit date: 10/19/1985  . Smokeless tobacco: Not on file  . Alcohol Use: Not on file  . Drug Use: Not on file  . Sexual Activity: Not on file   Other Topics Concern  . Not on file   Social History Narrative    Review of Systems:  CONSTITUTIONAL: No fevers, chills, night sweats, or weight loss.   EYES: No visual changes or eye pain ENT: No hearing changes.  No history of nose bleeds.   RESPIRATORY: No cough, wheezing and shortness of breath.   CARDIOVASCULAR: Negative for chest pain, and palpitations.   GI: Negative for abdominal discomfort, blood in stools or black stools.  No recent change in bowel habits.   GU:  No history of incontinence.   MUSCLOSKELETAL: No history of joint pain or swelling.  No myalgias.   SKIN: Negative for lesions, rash, and itching.   HEMATOLOGY/ONCOLOGY: Negative for prolonged bleeding, bruising easily, and swollen nodes.  No history of cancer.   ENDOCRINE: Negative for  cold or heat intolerance, polydipsia or goiter.   PSYCH:  No depression + anxiety symptoms.   NEURO: As Above.   Vital Signs:  BP 126/76 mmHg  Pulse 62  Ht 5' (1.524 m)  Wt 179 lb 3.2 oz (81.285 kg)  BMI 35.00 kg/m2  SpO2 98%   General Medical Exam:   General:  Well appearing, comfortable.   Eyes/ENT: see cranial nerve examination.   Neck: No masses appreciated.  Full range of motion without tenderness.  No carotid bruits. Respiratory:  Clear to auscultation, good air entry bilaterally.   Cardiac:  Regular rate and rhythm, no murmur.   Extremities:  No deformities, edema, or skin discoloration.  Skin:  No rashes or lesions.  Neurological Exam: MENTAL STATUS including orientation to time, place, person, recent and remote memory, attention span and concentration, language, and fund of knowledge is normal.  Speech is not dysarthric.  CRANIAL NERVES: II:  No visual field defects.  Unremarkable fundi.   III-IV-VI: Pupils equal round and reactive to light.  Normal conjugate, extra-ocular eye movements in all directions of gaze.  No nystagmus.  No ptosis.   V:  Normal facial sensation.     VII:  Normal facial symmetry and movements.   VIII:  Normal hearing and vestibular function.   IX-X:  Normal palatal movement.   XI:  Normal shoulder shrug and head rotation.   XII:  Normal tongue strength and range of motion, no deviation or fasciculation.  MOTOR:  No atrophy, fasciculations or abnormal movements.  No pronator drift.  Tone is normal.    Right Upper Extremity:    Left Upper Extremity:    Deltoid  5/5   Deltoid  5/5   Biceps  5/5   Biceps  5/5   Triceps  5/5   Triceps  5/5   Wrist extensors  5/5   Wrist extensors  5/5   Wrist flexors  5/5   Wrist flexors  5/5   Finger extensors  5/5   Finger extensors  5/5   Finger flexors  5/5   Finger flexors  5/5   Dorsal interossei  5/5   Dorsal interossei  5/5   Abductor pollicis  5/5   Abductor pollicis  5/5   Tone (Ashworth scale)  0   Tone (Ashworth scale)  0   Right Lower Extremity:    Left Lower Extremity:    Hip flexors  5/5   Hip flexors  5/5   Hip extensors  5/5   Hip extensors  5/5   Knee flexors  5/5   Knee flexors  5/5   Knee extensors  5/5   Knee extensors  5/5   Dorsiflexors  5/5   Dorsiflexors  5/5   Plantarflexors  5/5   Plantarflexors  5/5   Toe extensors  5/5   Toe extensors  5/5   Toe flexors  5/5   Toe flexors  5/5   Tone (Ashworth scale)  0  Tone (Ashworth scale)  0   MSRs:  Right                                                                 Left brachioradialis 2+  brachioradialis 2+  biceps 2+  biceps 2+  triceps 2+  triceps 2+  patellar 1+  Patellar 1+  ankle jerk 0  ankle jerk 0  Hoffman no  Hoffman no  plantar response down  plantar response down   SENSORY:  Reduced vibration to 40% at kness, 25% ankles, and absent at great toe.  Temperature is reduced in the lower legs.  Pin prick is normal.  Romberg's sign absent.   COORDINATION/GAIT: Normal finger-to- nose-finger and heel-to-shin.  Intact rapid alternating movements bilaterally.  Able to rise from a chair without using arms.  Gait narrow based and stable. Tandem and stressed gait intact.    IMPRESSION: Mrs. Firkus is a delightful 74 year-old female presenting for evaluation of worsening painful diabetic neuropathy.  She has a long history of diabetes which has been well-controlled of late, but reports to have steadily progression of paresthesias.  She has been tolerating gabapentin 683m TID well without any side effects, so I will uptitrate.  Other options include Lyrica, TCAs, SNRIs. I have also offered her a trial of lidocaine ointment for her feet.  I had extensive discussion with the patient regarding the pathogenesis, etiology, management, and natural course of neuropathy.   Regarding her seizure disorder, it appears that she has exclusively nocturnal grand mal seizures, but I do not see any documentation where she has seen neurology  in the past or had an EEG.  At this time, she seems stable on oxcarbamazepine 2217mtwice daily with her last seizure 2 years ago.  She is ok to resume driving as per [place] law which prohibits driving for 6-86-monthrom last event.  MRI brain does not show any focal abnormalities.  Consider EEG, if she has a break through seizure.    PLAN/RECOMMENDATIONS:  1.  Increase gabapentin 300m89m follows:   AM  Afternoon  PM      Week 1 600mg27m  617m   901m(3 tablets)      Week 2 [id number]   Week 3 800mg 49mmg  28mmg - 29mt taking 800mg tab52m  2.  Apply lidocaine ointment to feet bilaterally   3.  OK to resume driving per [place] law since you have been seizure free for 6-months 53-monthurn to clinic 3-months  58-monthation of this appointment visit was 50 minutes of face-to-face time with the patient.  Greater than 50% of this time was spent in counseling, explanation of diagnosis, planning of further management, and coordination of care.   Thank you for allowing me to participate in patient's care.  If I can answer any additional questions, I would be pleased to do so.    Sincerely,    Aveon Colquhoun K. Aliese Brannum, DOPosey Pronto

## 2014-07-30 ENCOUNTER — Encounter: Payer: Self-pay | Admitting: Internal Medicine

## 2014-08-13 ENCOUNTER — Other Ambulatory Visit: Payer: Self-pay | Admitting: Internal Medicine

## 2014-08-22 ENCOUNTER — Other Ambulatory Visit: Payer: Self-pay | Admitting: Internal Medicine

## 2014-09-05 ENCOUNTER — Telehealth: Payer: Self-pay

## 2014-09-05 ENCOUNTER — Other Ambulatory Visit: Payer: Self-pay

## 2014-09-05 MED ORDER — HYDROCODONE-ACETAMINOPHEN 5-325 MG PO TABS: 0.5000 | ORAL_TABLET | Freq: Three times a day (TID) | ORAL | Status: DC | PRN

## 2014-09-06 ENCOUNTER — Other Ambulatory Visit: Payer: Self-pay | Admitting: Internal Medicine

## 2014-09-08 NOTE — Telephone Encounter (Signed)
Spoke with Patient and confirmed receipt of Flu vaccine/ Sept 2015   Also requested apt and renewal of pain med/ Referred to CMA of MD for fup.  CMA to call patient when RX is ready for pick up.

## 2014-09-26 ENCOUNTER — Encounter (HOSPITAL_COMMUNITY): Payer: Self-pay | Admitting: *Deleted

## 2014-09-26 ENCOUNTER — Emergency Department (HOSPITAL_COMMUNITY)
Admission: EM | Admit: 2014-09-26 | Discharge: 2014-09-27 | Disposition: A | Discharge status: Home / Self Care | Payer: Medicare Other | Attending: Emergency Medicine | Admitting: Emergency Medicine

## 2014-09-26 DIAGNOSIS — Z79899 Other long term (current) drug therapy: Secondary | ICD-10-CM | POA: Insufficient documentation

## 2014-09-26 DIAGNOSIS — Z9981 Dependence on supplemental oxygen: Secondary | ICD-10-CM | POA: Insufficient documentation

## 2014-09-26 DIAGNOSIS — I10 Essential (primary) hypertension: Secondary | ICD-10-CM | POA: Diagnosis not present

## 2014-09-26 DIAGNOSIS — E119 Type 2 diabetes mellitus without complications: Secondary | ICD-10-CM | POA: Diagnosis not present

## 2014-09-26 DIAGNOSIS — G629 Polyneuropathy, unspecified: Secondary | ICD-10-CM | POA: Insufficient documentation

## 2014-09-26 DIAGNOSIS — R42 Dizziness and giddiness: Secondary | ICD-10-CM | POA: Diagnosis present

## 2014-09-26 DIAGNOSIS — K219 Gastro-esophageal reflux disease without esophagitis: Secondary | ICD-10-CM | POA: Insufficient documentation

## 2014-09-26 DIAGNOSIS — Z87891 Personal history of nicotine dependence: Secondary | ICD-10-CM | POA: Diagnosis not present

## 2014-09-26 DIAGNOSIS — G40909 Epilepsy, unspecified, not intractable, without status epilepticus: Secondary | ICD-10-CM | POA: Insufficient documentation

## 2014-09-26 DIAGNOSIS — Z880 Allergy status to penicillin: Secondary | ICD-10-CM | POA: Insufficient documentation

## 2014-09-26 DIAGNOSIS — M1990 Unspecified osteoarthritis, unspecified site: Secondary | ICD-10-CM | POA: Insufficient documentation

## 2014-09-26 DIAGNOSIS — G4733 Obstructive sleep apnea (adult) (pediatric): Secondary | ICD-10-CM | POA: Diagnosis not present

## 2014-09-26 DIAGNOSIS — Z8673 Personal history of transient ischemic attack (TIA), and cerebral infarction without residual deficits: Secondary | ICD-10-CM | POA: Diagnosis not present

## 2014-09-26 LAB — CBC WITH DIFFERENTIAL/PLATELET
BASOS PCT: 0 % (ref 0–1)
Basophils Absolute: 0 10*3/uL (ref 0.0–0.1)
Eosinophils Absolute: 0 10*3/uL (ref 0.0–0.7)
Eosinophils Relative: 0 % (ref 0–5)
HEMATOCRIT: 41.2 % (ref 36.0–46.0)
Hemoglobin: 13.8 g/dL (ref 12.0–15.0)
LYMPHS PCT: 46 % (ref 12–46)
Lymphs Abs: 2.9 10*3/uL (ref 0.7–4.0)
MCH: 30.5 pg (ref 26.0–34.0)
MCHC: 33.5 g/dL (ref 30.0–36.0)
MCV: 91.2 fL (ref 78.0–100.0)
MONO ABS: 0.4 10*3/uL (ref 0.1–1.0)
MONOS PCT: 6 % (ref 3–12)
Neutro Abs: 3 10*3/uL (ref 1.7–7.7)
Neutrophils Relative %: 48 % (ref 43–77)
Platelets: 274 10*3/uL (ref 150–400)
RBC: 4.52 MIL/uL (ref 3.87–5.11)
RDW: 13.3 % (ref 11.5–15.5)
WBC: 6.4 10*3/uL (ref 4.0–10.5)

## 2014-09-26 LAB — BASIC METABOLIC PANEL
Anion gap: 12 (ref 5–15)
BUN: 9 mg/dL (ref 6–23)
CALCIUM: 9.8 mg/dL (ref 8.4–10.5)
CO2: 27 mmol/L (ref 19–32)
CREATININE: 0.74 mg/dL (ref 0.50–1.10)
Chloride: 95 mmol/L — ABNORMAL LOW (ref 96–112)
GFR calc Af Amer: >90 mL/min (ref >90)
GFR calc non Af Amer: 84 mL/min — ABNORMAL LOW (ref >90)
GLUCOSE: 99 mg/dL (ref 70–99)
Potassium: 3.4 mmol/L — ABNORMAL LOW (ref 3.5–5.1)
Sodium: 134 mmol/L — ABNORMAL LOW (ref 135–145)

## 2014-09-26 LAB — CBG MONITORING, ED: Glucose-Capillary: 97 mg/dL (ref 70–99)

## 2014-09-26 LAB — TROPONIN I: Troponin I: <0.03 ng/mL (ref <0.031)

## 2014-09-26 MED ORDER — POTASSIUM CHLORIDE CRYS ER 20 MEQ PO TBCR
40.0000 meq | EXTENDED_RELEASE_TABLET | Freq: Once | ORAL | Status: AC
  Administered 2014-09-27: 40 meq via ORAL
  Filled 2014-09-26: qty 2

## 2014-09-26 MED ORDER — ONDANSETRON 4 MG PO TBDP
8.0000 mg | ORAL_TABLET | Freq: Once | ORAL | Status: AC
  Administered 2014-09-26: 8 mg via ORAL
  Filled 2014-09-26: qty 2

## 2014-09-26 MED ORDER — KETOROLAC TROMETHAMINE 30 MG/ML IJ SOLN: 30.0000 mg | Freq: Once | INTRAMUSCULAR | Status: DC

## 2014-09-26 MED ORDER — MECLIZINE HCL 25 MG PO TABS
25.0000 mg | ORAL_TABLET | Freq: Once | ORAL | Status: AC
  Administered 2014-09-27: 25 mg via ORAL
  Filled 2014-09-26: qty 1

## 2014-09-26 NOTE — ED Provider Notes (Signed)
CSN: [id number]     Arrival date & time 09/26/14  2005 History   First MD Initiated Contact with Patient 09/26/14 2305     Chief Complaint  Patient presents with  . Dizziness     (Consider location/radiation/quality/duration/timing/severity/associated sxs/prior Treatment) Patient is a 74 y.o. female presenting with dizziness. The history is provided by the patient.  Dizziness She started having dizziness 2 days ago. She has difficulty describing the dizziness, but it has been associated with nausea. She did vomit yesterday. Dizziness got worse yesterday and much worse today. She is noted that she is off balance when she walks but she has not fallen. She denies headache and tinnitus and hearing loss. She did not attempt any treatment at home. She did not note is anything making it better or worse. She went to an urgent care center and was referred here. At yes, she had been given a dose of ondansetron and nausea has improved but dizziness is not changed.  Past Medical History  Diagnosis Date  . Diabetes mellitus, type 2   . GERD (gastroesophageal reflux disease)   . Depression   . Seizure disorder   . Dyslipidemia   . OSA on CPAP   . Osteoarthritis of shoulder region     and Knee  . Anxiety   . Diverticulosis of colon 03/2010 hosp  . Hypertension   . Cerebrovascular disease, unspecified 03/13/2013    Atrophy and small vessel dz noted, MR brain 2009  . Allergic rhinitis, cause unspecified 03/13/2013  . Neuropathy    Past Surgical History  Procedure Laterality Date  . Cholecystectomy    . Abdominal hysterectomy  1970's    Partial  . Appendectomy    . Tonsillectomy and adenoidectomy    . Shoulder surgery  2008    LT, post fall    Family History  Problem Relation Age of Onset  . Arthritis Mother   . Heart disease Father   . Arthritis Other     Grandmother  . Diabetes Other     Grandmother   History  Substance Use Topics  . Smoking status: Former Smoker    Quit date:  10/19/1985  . Smokeless tobacco: Not on file  . Alcohol Use: Yes   OB History    No data available     Review of Systems  Neurological: Positive for dizziness.  All other systems reviewed and are negative.     Allergies  Other; Statins; and Penicillins  Home Medications   Prior to Admission medications   Medication Sig Start Date End Date Taking? Authorizing Provider  albuterol (PROAIR HFA) 108 (90 BASE) MCG/ACT inhaler Inhale 2 puffs into the lungs every 6 (six) hours as needed for wheezing or shortness of breath.    Yes Historical Provider, MD  Alpha-D-Galactosidase (BEANO PO) Take 1-2 tablets by mouth daily as needed (for gas).   Yes Historical Provider, MD  ALPRAZolam (XANAX) 0.25 MG tablet Take 0.25 mg by mouth 2 (two) times daily as needed for anxiety.  08/11/11  Yes Rowe Clack, MD  aspirin 81 MG tablet Take 81 mg by mouth daily.     Yes Historical Provider, MD  azelastine (ASTELIN) 0.1 % nasal spray Place 2 sprays into both nostrils 2 (two) times daily as needed for rhinitis. Use in each nostril as directed   Yes Historical Provider, MD  Biotin 5000 MCG TABS Take 5,000 mcg by mouth daily. 11/29/12  Yes Rowe Clack, MD  Black Cohosh 80 MG  CAPS Take 80 capsules by mouth daily as needed (for vitamin).   Yes Historical Provider, MD  Cholecalciferol (EQL VITAMIN D3) 1000 UNITS tablet Take 1,000 Units by mouth daily as needed (for vitamin).    Yes Historical Provider, MD  Cyanocobalamin (VITAMIN B 12) 100 MCG LOZG Take 100 mcg by mouth daily as needed (for vitamin).    Yes Historical Provider, MD  cyclobenzaprine (FLEXERIL) 5 MG tablet TAKE 1 TABLET (5 MG TOTAL) BY MOUTH 3 (THREE) TIMES DAILY AS NEEDED FOR MUSCLE SPASMS. 07/04/14  Yes Rowe Clack, MD  famotidine (PEPCID) 20 MG tablet Take 1 tablet (20 mg total) by mouth daily. 09/08/14  Yes Rowe Clack, MD  gabapentin (NEURONTIN) 800 MG tablet Take 1 tablet (800 mg total) by mouth 3 (three) times daily. 07/28/14   Yes Donika K Patel, DO  glipiZIDE (GLUCOTROL XL) 5 MG 24 hr tablet TAKE 1 TABLET (5 MG TOTAL) BY MOUTH DAILY.   Yes Rowe Clack, MD  lidocaine (XYLOCAINE) 5 % ointment Apply 1 application topically 2 (two) times daily as needed. Use gloves when applying to feet 07/28/14  Yes Donika K Patel, DO  losartan (COZAAR) 50 MG tablet Take 1 tablet (50 mg total) by mouth daily. 08/13/14  Yes Rowe Clack, MD  meclizine (ANTIVERT) 25 MG tablet TAKE 1 TABLET (25 MG TOTAL) BY MOUTH 3 (THREE) TIMES DAILY AS NEEDED FOR DIZZINESS OR NAUSEA. 07/15/14  Yes Rowe Clack, MD  metFORMIN (GLUCOPHAGE-XR) 500 MG 24 hr tablet TAKE 1 TABLET (500 MG TOTAL) BY MOUTH DAILY WITH BREAKFAST. 08/22/14  Yes Rowe Clack, MD  Multiple Vitamin (MULTIVITAMIN) capsule Take 1 capsule by mouth daily as needed (for vitamin).    Yes Historical Provider, MD  OXcarbazepine (TRILEPTAL) 150 MG tablet TAKE 1 AND 1/2 TABLET BY MOUTH TWICE DAILY 04/21/14  Yes Rowe Clack, MD  PARoxetine (PAXIL) 10 MG tablet Take 10 mg by mouth daily as needed (for mood).   Yes Historical Provider, MD  polyethylene glycol powder (GLYCOLAX/MIRALAX) powder Take 17 g by mouth daily. 06/11/14  Yes Rowe Clack, MD  Probiotic Product (PROBIOTIC PO) Take 1 capsule by mouth daily after breakfast.    Yes Historical Provider, MD  traMADol (ULTRAM) 50 MG tablet TAKE 1 TABLET BY MOUTH EVERY 6 HOURS AS NEEDED FOR PAIN 03/28/13  Yes Biagio Borg, MD  ACCU-CHEK AVIVA PLUS test strip Check blood sugar two times daily 08/29/13   Rowe Clack, MD  Blood Glucose Monitoring Suppl (ACCU-CHEK AVIVA PLUS) W/DEVICE KIT 1 kit by Does not apply route once. Patient not taking: Reported on 09/26/2014 11/13/12   Rowe Clack, MD  ciprofloxacin (CIPRO) 500 MG tablet Take 1 tablet (500 mg total) by mouth 2 (two) times daily. Patient not taking: Reported on 09/26/2014 05/16/14   Melton Alar, PA-C  famotidine (PEPCID) 20 MG tablet Take 1 tablet (20 mg  total) by mouth daily. Patient not taking: Reported on 09/26/2014 12/05/13   Rowe Clack, MD  HYDROcodone-acetaminophen (NORCO) 5-325 MG per tablet Take 0.5-1 tablets by mouth every 8 (eight) hours as needed. Patient not taking: Reported on 09/26/2014 09/05/14   Rowe Clack, MD  losartan (COZAAR) 50 MG tablet TAKE 1 TABLET BY MOUTH EVERY DAY Patient not taking: Reported on 09/26/2014 07/15/14   Rowe Clack, MD  metFORMIN (GLUCOPHAGE-XR) 500 MG 24 hr tablet Take 1 tablet (500 mg total) by mouth daily with breakfast. Patient not taking: Reported on 09/26/2014  12/05/13   Rowe Clack, MD  metroNIDAZOLE (FLAGYL) 500 MG tablet Take 1 tablet (500 mg total) by mouth every 8 (eight) hours. Patient not taking: Reported on 09/26/2014 05/16/14   Bobby Rumpf [place], PA-C   BP 147/65 mmHg  Pulse 60  Temp(Src) 98.7 F (37.1 C) (Oral)  Resp 16  Ht '5\' 5"'  (1.651 m)  Wt 175 lb (79.379 kg)  BMI 29.12 kg/m2  SpO2 99% Physical Exam  Nursing note and vitals reviewed.  74 year old female, resting comfortably and in no acute distress. Vital signs are significant for mild hypertension. Oxygen saturation is 99%, which is normal. Head is normocephalic and atraumatic. PERRLA, EOMI. Oropharynx is clear. Fundi show no hemorrhage, exudate, or papilledema. No nystagmus is seen. Neck is nontender and supple without adenopathy or JVD. Dizziness is partially reproduced by passive head movement. Back is nontender and there is no CVA tenderness. Lungs are clear without rales, wheezes, or rhonchi. Chest is nontender. Heart has regular rate and rhythm without murmur. Abdomen is soft, flat, nontender without masses or hepatosplenomegaly and peristalsis is normoactive. Extremities have no cyanosis or edema, full range of motion is present. Skin is warm and dry without rash. Neurologic: Mental status is normal, cranial nerves are intact, there are no motor or sensory deficits. Finger to nose testing is normal.  Romberg is mildly unstable and that tandem gait is markedly ataxic.  ED Course  Procedures (including critical care time) Labs Review Results for orders placed or performed during the hospital encounter of 93/71/69  Basic metabolic panel  Result Value Ref Range   Sodium 134 (L) 135 - 145 mmol/L   Potassium 3.4 (L) 3.5 - 5.1 mmol/L   Chloride 95 (L) 96 - 112 mmol/L   CO2 27 19 - 32 mmol/L   Glucose, Bld 99 70 - 99 mg/dL   BUN 9 6 - 23 mg/dL   Creatinine, Ser 0.74 0.50 - 1.10 mg/dL   Calcium 9.8 8.4 - 10.5 mg/dL   GFR calc non Af Amer 84 (L) >90 mL/min   GFR calc Af Amer >90 >90 mL/min   Anion gap 12 5 - 15  CBC with Differential  Result Value Ref Range   WBC 6.4 4.0 - 10.5 K/uL   RBC 4.52 3.87 - 5.11 MIL/uL   Hemoglobin 13.8 12.0 - 15.0 g/dL   HCT 41.2 36.0 - 46.0 %   MCV 91.2 78.0 - 100.0 fL   MCH 30.5 26.0 - 34.0 pg   MCHC 33.5 30.0 - 36.0 g/dL   RDW 13.3 11.5 - 15.5 %   Platelets 274 150 - 400 K/uL   Neutrophils Relative % 48 43 - 77 %   Neutro Abs 3.0 1.7 - 7.7 K/uL   Lymphocytes Relative 46 12 - 46 %   Lymphs Abs 2.9 0.7 - 4.0 K/uL   Monocytes Relative 6 3 - 12 %   Monocytes Absolute 0.4 0.1 - 1.0 K/uL   Eosinophils Relative 0 0 - 5 %   Eosinophils Absolute 0.0 0.0 - 0.7 K/uL   Basophils Relative 0 0 - 1 %   Basophils Absolute 0.0 0.0 - 0.1 K/uL  Troponin I  Result Value Ref Range   Troponin I <0.03 <0.031 ng/mL  CBG monitoring, ED  Result Value Ref Range   Glucose-Capillary 97 70 - 99 mg/dL    EKG Interpretation   Date/Time:  Friday September 26 2014 20:14:32 EDT Ventricular Rate:  64 PR Interval:  142 QRS Duration: 82  QT Interval:  404 QTC Calculation: 416 R Axis:   65 Text Interpretation:  Normal sinus rhythm Normal ECG When compared with  ECG of 05/12/2014, No significant change was found Confirmed by [facility]  MD,  Zelma Snead ([id number]) on 09/27/2014 12:20:27 AM      MDM   Final diagnoses:  Dizziness    Pregabalin as components suggestive of both  peripheral and central vertigo. She is status post right total knee replacement, but will try to obtain MRI scan. She is also given a therapeutic trial of meclizine. Mild hypokalemia is noted and she's given a dose of oral potassium. Case is signed out to Dr. Dina Rich to evaluate results of MRI scan.    Delora Fuel, MD 14/97/02 [id number]

## 2014-09-26 NOTE — ED Notes (Signed)
The pt is c/o dizziness with nausea.  She was sent here from [facility] for treatment.  No pain

## 2014-09-27 ENCOUNTER — Emergency Department (HOSPITAL_COMMUNITY): Payer: Medicare Other

## 2014-09-27 MED ORDER — MECLIZINE HCL 25 MG PO TABS: 25.0000 mg | ORAL_TABLET | Freq: Three times a day (TID) | ORAL | Status: DC | PRN

## 2014-09-27 MED ORDER — ONDANSETRON 8 MG PO TBDP: 8.0000 mg | ORAL_TABLET | Freq: Three times a day (TID) | ORAL | Status: DC | PRN

## 2014-09-27 NOTE — Discharge Instructions (Signed)

## 2014-09-27 NOTE — ED Notes (Signed)
[place], MD at bedside.

## 2014-11-14 ENCOUNTER — Other Ambulatory Visit: Payer: Self-pay

## 2014-11-14 MED ORDER — GABAPENTIN 800 MG PO TABS: 800.0000 mg | ORAL_TABLET | Freq: Three times a day (TID) | ORAL | Status: DC

## 2014-12-16 ENCOUNTER — Encounter: Payer: Self-pay | Admitting: Family

## 2014-12-16 ENCOUNTER — Telehealth: Payer: Self-pay | Admitting: Family

## 2014-12-16 ENCOUNTER — Ambulatory Visit (INDEPENDENT_AMBULATORY_CARE_PROVIDER_SITE_OTHER): Payer: Medicare Other | Admitting: Family

## 2014-12-16 ENCOUNTER — Other Ambulatory Visit (INDEPENDENT_AMBULATORY_CARE_PROVIDER_SITE_OTHER): Payer: 59

## 2014-12-16 DIAGNOSIS — E1140 Type 2 diabetes mellitus with diabetic neuropathy, unspecified: Secondary | ICD-10-CM

## 2014-12-16 DIAGNOSIS — R600 Localized edema: Secondary | ICD-10-CM

## 2014-12-16 LAB — HEMOGLOBIN A1C: Hgb A1c MFr Bld: 6.4 % (ref 4.6–6.5)

## 2014-12-16 MED ORDER — HYDROCHLOROTHIAZIDE 12.5 MG PO CAPS: 12.5000 mg | ORAL_CAPSULE | Freq: Every day | ORAL | Status: DC | PRN

## 2014-12-16 NOTE — Assessment & Plan Note (Signed)
Symptoms consistent with peripheral neuropathy and currently maintained on gabapentin, lidocaine ointment, and oxcarbazepine. Previously followed by neurology for her peripheral neuropathy. Recommend continuing current dosages of medications and follow-up with Dr. Posey Pronto of neurology for adjustments to medications. Obtain A1c and B6 levels. Follow-up if symptoms worsen or fail to improve.

## 2014-12-16 NOTE — Assessment & Plan Note (Signed)
Bilateral mild lower extremity edema noted. Start hydrochlorothiazide as needed for edema. Follow-up if symptoms worsen or fail to improve.

## 2014-12-16 NOTE — Progress Notes (Signed)
Subjective:    Patient ID: [name], female    DOB: 30-Dec-1940, 74 y.o.   MRN: [id number]  Chief Complaint  Patient presents with  . Tingling    has tingling in her hands and feet, has pain in her neck when trying to move it, and states that her veins stand up more than usual and it hurts, wants to know if the neurotin should be increased and if she should get back on HCTZ for swelling    HPI:  [name] is a 74 y.o. female with a PMH of hypertension, CVA, sleep apnea, hepatic steatosis, GERD, IBS, diverticulitis, type 2 diabetes, depression, dyslipidemia, seizure disorder, vertigo who presents today for a follow-up office visit.  1.) Neuropathic pain - Continues to experience the associated symptom of neuropathy in her upper and lower extremities. She was previously seen by Dr. Posey Pronto and her gabapentin was increased to 800 mg three times daily and started on lidocaine ointment. There is some potential improvement, however she is not sure.   2.) Lower leg edema - Previously maintained on hydrochlorothiazide to prevent lower extremity edema. Has noticed recently noticed some mild increase in edema. the  Allergies  Allergen Reactions  . Other     "SEEDED" food due to stomach issues  . Statins Nausea And Vomiting  . Penicillins Hives and Rash    Current Outpatient Prescriptions on File Prior to Visit  Medication Sig Dispense Refill  . ACCU-CHEK AVIVA PLUS test strip Check blood sugar two times daily 200 each 6  . albuterol (PROAIR HFA) 108 (90 BASE) MCG/ACT inhaler Inhale 2 puffs into the lungs every 6 (six) hours as needed for wheezing or shortness of breath.     . Alpha-D-Galactosidase (BEANO PO) Take 1-2 tablets by mouth daily as needed (for gas).    . ALPRAZolam (XANAX) 0.25 MG tablet Take 0.25 mg by mouth 2 (two) times daily as needed for anxiety.     Marland Kitchen aspirin 81 MG tablet Take 81 mg by mouth daily.      Marland Kitchen azelastine (ASTELIN) 0.1 % nasal spray Place 2 sprays into both  nostrils 2 (two) times daily as needed for rhinitis. Use in each nostril as directed    . Biotin 5000 MCG TABS Take 5,000 mcg by mouth daily. 30 tablet   . Black Cohosh 80 MG CAPS Take 80 capsules by mouth daily as needed (for vitamin).    . Blood Glucose Monitoring Suppl (ACCU-CHEK AVIVA PLUS) W/DEVICE KIT 1 kit by Does not apply route once. 1 kit 0  . Cholecalciferol (EQL VITAMIN D3) 1000 UNITS tablet Take 1,000 Units by mouth daily as needed (for vitamin).     . Cyanocobalamin (VITAMIN B 12) 100 MCG LOZG Take 100 mcg by mouth daily as needed (for vitamin).     . cyclobenzaprine (FLEXERIL) 5 MG tablet TAKE 1 TABLET (5 MG TOTAL) BY MOUTH 3 (THREE) TIMES DAILY AS NEEDED FOR MUSCLE SPASMS. 30 tablet 1  . famotidine (PEPCID) 20 MG tablet Take 1 tablet (20 mg total) by mouth daily. 90 tablet 1  . gabapentin (NEURONTIN) 800 MG tablet Take 1 tablet (800 mg total) by mouth 3 (three) times daily. 270 tablet 3  . glipiZIDE (GLUCOTROL XL) 5 MG 24 hr tablet TAKE 1 TABLET (5 MG TOTAL) BY MOUTH DAILY. 90 tablet 3  . HYDROcodone-acetaminophen (NORCO) 5-325 MG per tablet Take 0.5-1 tablets by mouth every 8 (eight) hours as needed. 60 tablet 0  . lidocaine (XYLOCAINE)  5 % ointment Apply 1 application topically 2 (two) times daily as needed. Use gloves when applying to feet 35.44 g 5  . losartan (COZAAR) 50 MG tablet Take 1 tablet (50 mg total) by mouth daily. 30 tablet 4  . meclizine (ANTIVERT) 25 MG tablet Take 1 tablet (25 mg total) by mouth 3 (three) times daily as needed for dizziness. 30 tablet 0  . metFORMIN (GLUCOPHAGE-XR) 500 MG 24 hr tablet TAKE 1 TABLET (500 MG TOTAL) BY MOUTH DAILY WITH BREAKFAST. 90 tablet 3  . metroNIDAZOLE (FLAGYL) 500 MG tablet Take 1 tablet (500 mg total) by mouth every 8 (eight) hours. 30 tablet 0  . Multiple Vitamin (MULTIVITAMIN) capsule Take 1 capsule by mouth daily as needed (for vitamin).     . ondansetron (ZOFRAN-ODT) 8 MG disintegrating tablet Take 1 tablet (8 mg total) by  mouth every 8 (eight) hours as needed for nausea or vomiting. 20 tablet 0  . OXcarbazepine (TRILEPTAL) 150 MG tablet TAKE 1 AND 1/2 TABLET BY MOUTH TWICE DAILY 90 tablet 5  . PARoxetine (PAXIL) 10 MG tablet Take 10 mg by mouth daily as needed (for mood).    . polyethylene glycol powder (GLYCOLAX/MIRALAX) powder Take 17 g by mouth daily. 500 g 0  . Probiotic Product (PROBIOTIC PO) Take 1 capsule by mouth daily after breakfast.     . traMADol (ULTRAM) 50 MG tablet TAKE 1 TABLET BY MOUTH EVERY 6 HOURS AS NEEDED FOR PAIN 30 tablet 0   No current facility-administered medications on file prior to visit.     Review of Systems  Constitutional: Negative for fever and chills.  Respiratory: Negative for cough, chest tightness and shortness of breath.   Cardiovascular: Positive for leg swelling. Negative for chest pain and palpitations.  Neurological: Positive for numbness. Negative for weakness.      Objective:    BP 112/62 mmHg  Pulse 70  Temp(Src) 98.3 F (36.8 C) (Oral)  Resp 18  Ht 5' (1.524 m)  Wt 179 lb (81.194 kg)  BMI 34.96 kg/m2  SpO2 98% Nursing note and vital signs reviewed.  Physical Exam  Constitutional: She is oriented to person, place, and time. She appears well-developed and well-nourished. No distress.  Cardiovascular: Normal rate, regular rhythm, normal heart sounds and intact distal pulses.   Mild non-pitting edema noted in bilateral lower extremities.   Pulmonary/Chest: Effort normal and breath sounds normal.  Neurological: She is alert and oriented to person, place, and time.  Skin: Skin is warm and dry.  Psychiatric: She has a normal mood and affect. Her behavior is normal. Judgment and thought content normal.       Assessment & Plan:   Problem List Items Addressed This Visit      Endocrine   DM neuropathy, type II diabetes mellitus (Chronic)    Symptoms consistent with peripheral neuropathy and currently maintained on gabapentin, lidocaine ointment, and  oxcarbazepine. Previously followed by neurology for her peripheral neuropathy. Recommend continuing current dosages of medications and follow-up with Dr. Posey Pronto of neurology for adjustments to medications. Obtain A1c and B6 levels. Follow-up if symptoms worsen or fail to improve.        Other   Lower extremity edema    Bilateral mild lower extremity edema noted. Start hydrochlorothiazide as needed for edema. Follow-up if symptoms worsen or fail to improve.       Other Visit Diagnoses    Neuropathy    -  Primary    Relevant Orders  Hemoglobin A1c    Vitamin B6

## 2014-12-16 NOTE — Patient Instructions (Signed)
Thank you for choosing [facility].  Summary/Instructions:  Please follow-up with Dr. Posey Pronto regarding your neuropathy. Please continue to take her current medications at this time.  For your lower extremity swelling, please take 1 hydrochlorothiazide tablet daily as needed for swelling.   Please continue to take vitamin B12 and add vitamin B6.   Your prescription(s) have been submitted to your pharmacy or been printed and provided for you. Please take as directed and contact our office if you believe you are having problem(s) with the medication(s) or have any questions.  Please stop by the lab on the basement level of the building for your blood work. Your results will be released to [facility] (or called to you) after review, usually within 72 hours after test completion. If any changes need to be made, you will be notified at that same time.  If your symptoms worsen or fail to improve, please contact our office for further instruction, or in case of emergency go directly to the emergency room at the closest medical facility.

## 2014-12-16 NOTE — Progress Notes (Signed)
Pre visit review using our clinic review tool, if applicable. No additional management support is needed unless otherwise documented below in the visit note. 

## 2014-12-16 NOTE — Telephone Encounter (Signed)
Please inform the patient that her A1c is 6.4. Therefore no changes are needed at this time.

## 2014-12-16 NOTE — Telephone Encounter (Signed)
Patient would like to switch providers, she would prefer to see Dr Doug Sou, because she is a woman. Please advise

## 2014-12-16 NOTE — Telephone Encounter (Signed)
I am fine with that, however the patient never established with me and has been under the care of Dr. Asa Lente.

## 2014-12-17 ENCOUNTER — Telehealth: Payer: Self-pay | Admitting: Internal Medicine

## 2014-12-17 NOTE — Telephone Encounter (Signed)
Would recommend that she stay with Dr. Asa Lente then since she is her doctor already. Remind her that Dr. Asa Lente is still here and willing to see her but has reduced hours.

## 2014-12-17 NOTE — Telephone Encounter (Signed)
Patient is calling for the result of her lab results

## 2014-12-17 NOTE — Telephone Encounter (Signed)
LVM letting pt know the results below.

## 2014-12-17 NOTE — Telephone Encounter (Signed)
Called pt to let her know results.

## 2014-12-22 LAB — VITAMIN B6: VITAMIN B6: 10.4 ng/mL (ref 2.1–21.7)

## 2014-12-30 ENCOUNTER — Telehealth: Payer: Self-pay | Admitting: Family

## 2014-12-30 NOTE — Telephone Encounter (Signed)
Please inform patient that her B6 was normal.

## 2015-01-01 ENCOUNTER — Encounter: Payer: Self-pay | Admitting: Neurology

## 2015-01-01 ENCOUNTER — Ambulatory Visit (INDEPENDENT_AMBULATORY_CARE_PROVIDER_SITE_OTHER): Payer: 59 | Admitting: Neurology

## 2015-01-01 VITALS — BP 120/68 | HR 78 | Ht 60.0 in | Wt 178.2 lb

## 2015-01-01 DIAGNOSIS — G40909 Epilepsy, unspecified, not intractable, without status epilepticus: Secondary | ICD-10-CM | POA: Diagnosis not present

## 2015-01-01 DIAGNOSIS — Z79899 Other long term (current) drug therapy: Secondary | ICD-10-CM | POA: Diagnosis not present

## 2015-01-01 DIAGNOSIS — E1140 Type 2 diabetes mellitus with diabetic neuropathy, unspecified: Secondary | ICD-10-CM | POA: Diagnosis not present

## 2015-01-01 MED ORDER — NORTRIPTYLINE HCL 10 MG PO CAPS: ORAL_CAPSULE | ORAL | Status: DC

## 2015-01-01 NOTE — Telephone Encounter (Signed)
Yes, please continue to take the B6 at this time.

## 2015-01-01 NOTE — Telephone Encounter (Signed)
Pt aware.

## 2015-01-01 NOTE — Patient Instructions (Addendum)
1.  Start nortriptyline 10mg  at bedtime for two week, then increase to 20mg  (2 tablets) at bedtime. 2.  Continue gabapentin 800mg  three times daily 3.  Please call with an update in early September 4.  Return to clinic in 27-months

## 2015-01-01 NOTE — Progress Notes (Signed)
Follow-up Visit   Date: 01/01/2015    [name] MRN: [id number] DOB: Nov 08, 1940   Interim History: [name] is a 73 y.o. right-handed African American female with well controlled diabetes mellitus (HbA1c 6.7), unspecified seizure disorder (diagnosed 2009, well-controlled on OXC), hypertension, GERD, depression/anxiety, and osteoarthritis returning to the clinic for follow-up of painful neuropathy.  The patient was accompanied to the clinic by self.  History of present illness: She has been diabetic for about 15 years and was initially taking insulin, but since has been maintained on oral medications. Starting around ~ 8 years ago, she noticed numbness and tingling of her toes. Over the past three years, she noticed numbness/tingling of the feet, ankles, and lower legs. On rare occasions she has burning sensation of the face. She is taking gabapentin 664m three times daily which seems to help, but does not completely alleviate the pain. Symptoms have worsened in past several years. In 2013, she underwent EMG of the upper which showed bilateral CTS.  Starting in 2009, she started having rare spells of noctural generalized seizures with her last seizure in 2014. She does not recall any details because she looses consciousness. His husband tells her that she is shaking and biting her tongue. No history of urinary or bowel incontinence. Duration is ~ 5 min. When she is awake, she is is tired and confused. She is not driving. She is currently taking oxcarbazepine 2253mtwice daily and denies any side effects. No family history of seizures.  UPDATE 01/01/2015:  She increased gabapentin to 80075mID and noticed transient improvement, but over the past several months painful paresthesias has started to increase up her legs and the severity is worse.  No new weakness.  She endorses significant stress as her husband recently had his leg amputated and now is bilateral amputee so she  is the primary caregiver.  She feels down sometimes and never has time for herself. Home health will start coming next week and she is hoping that will relieve some burden.  She also has a spell of vertigo, as if she was spinning, but this resolved with meclizine.    Medications:  Current Outpatient Prescriptions on File Prior to Visit  Medication Sig Dispense Refill  . ACCU-CHEK AVIVA PLUS test strip Check blood sugar two times daily 200 each 6  . albuterol (PROAIR HFA) 108 (90 BASE) MCG/ACT inhaler Inhale 2 puffs into the lungs every 6 (six) hours as needed for wheezing or shortness of breath.     . Alpha-D-Galactosidase (BEANO PO) Take 1-2 tablets by mouth daily as needed (for gas).    . ALPRAZolam (XANAX) 0.25 MG tablet Take 0.25 mg by mouth 2 (two) times daily as needed for anxiety.     . aMarland Kitchenpirin 81 MG tablet Take 81 mg by mouth daily.      . aMarland Kitchenelastine (ASTELIN) 0.1 % nasal spray Place 2 sprays into both nostrils 2 (two) times daily as needed for rhinitis. Use in each nostril as directed    . Biotin 5000 MCG TABS Take 5,000 mcg by mouth daily. 30 tablet   . Black Cohosh 80 MG CAPS Take 80 capsules by mouth daily as needed (for vitamin).    . Blood Glucose Monitoring Suppl (ACCU-CHEK AVIVA PLUS) W/DEVICE KIT 1 kit by Does not apply route once. 1 kit 0  . Cholecalciferol (EQL VITAMIN D3) 1000 UNITS tablet Take 1,000 Units by mouth daily as needed (for vitamin).     . Cyanocobalamin (  VITAMIN B 12) 100 MCG LOZG Take 100 mcg by mouth daily as needed (for vitamin).     . cyclobenzaprine (FLEXERIL) 5 MG tablet TAKE 1 TABLET (5 MG TOTAL) BY MOUTH 3 (THREE) TIMES DAILY AS NEEDED FOR MUSCLE SPASMS. 30 tablet 1  . famotidine (PEPCID) 20 MG tablet Take 1 tablet (20 mg total) by mouth daily. 90 tablet 1  . gabapentin (NEURONTIN) 800 MG tablet Take 1 tablet (800 mg total) by mouth 3 (three) times daily. 270 tablet 3  . glipiZIDE (GLUCOTROL XL) 5 MG 24 hr tablet TAKE 1 TABLET (5 MG TOTAL) BY MOUTH DAILY.  90 tablet 3  . hydrochlorothiazide (MICROZIDE) 12.5 MG capsule Take 1 capsule (12.5 mg total) by mouth daily as needed. For edema 30 capsule 0  . HYDROcodone-acetaminophen (NORCO) 5-325 MG per tablet Take 0.5-1 tablets by mouth every 8 (eight) hours as needed. 60 tablet 0  . lidocaine (XYLOCAINE) 5 % ointment Apply 1 application topically 2 (two) times daily as needed. Use gloves when applying to feet 35.44 g 5  . losartan (COZAAR) 50 MG tablet Take 1 tablet (50 mg total) by mouth daily. 30 tablet 4  . meclizine (ANTIVERT) 25 MG tablet Take 1 tablet (25 mg total) by mouth 3 (three) times daily as needed for dizziness. 30 tablet 0  . metFORMIN (GLUCOPHAGE-XR) 500 MG 24 hr tablet TAKE 1 TABLET (500 MG TOTAL) BY MOUTH DAILY WITH BREAKFAST. 90 tablet 3  . metroNIDAZOLE (FLAGYL) 500 MG tablet Take 1 tablet (500 mg total) by mouth every 8 (eight) hours. 30 tablet 0  . Multiple Vitamin (MULTIVITAMIN) capsule Take 1 capsule by mouth daily as needed (for vitamin).     . ondansetron (ZOFRAN-ODT) 8 MG disintegrating tablet Take 1 tablet (8 mg total) by mouth every 8 (eight) hours as needed for nausea or vomiting. 20 tablet 0  . OXcarbazepine (TRILEPTAL) 150 MG tablet TAKE 1 AND 1/2 TABLET BY MOUTH TWICE DAILY 90 tablet 5  . PARoxetine (PAXIL) 10 MG tablet Take 10 mg by mouth daily as needed (for mood).    . polyethylene glycol powder (GLYCOLAX/MIRALAX) powder Take 17 g by mouth daily. 500 g 0  . Probiotic Product (PROBIOTIC PO) Take 1 capsule by mouth daily after breakfast.     . traMADol (ULTRAM) 50 MG tablet TAKE 1 TABLET BY MOUTH EVERY 6 HOURS AS NEEDED FOR PAIN 30 tablet 0   No current facility-administered medications on file prior to visit.    Allergies:  Allergies  Allergen Reactions  . Other     "SEEDED" food due to stomach issues  . Statins Nausea And Vomiting  . Penicillins Hives and Rash    Review of Systems:  CONSTITUTIONAL: No fevers, chills, night sweats, or weight loss.  EYES: No  visual changes or eye pain ENT: No hearing changes.  No history of nose bleeds.   RESPIRATORY: No cough, wheezing and shortness of breath.   CARDIOVASCULAR: Negative for chest pain, and palpitations.   GI: Negative for abdominal discomfort, blood in stools or black stools.  No recent change in bowel habits.   GU:  No history of incontinence.   MUSCLOSKELETAL: +history of joint pain or swelling.  No myalgias.   SKIN: Negative for lesions, rash, and itching.   ENDOCRINE: Negative for cold or heat intolerance, polydipsia or goiter.   PSYCH:  + depression or anxiety symptoms.   NEURO: As Above.   Vital Signs:  BP 120/68 mmHg  Pulse 78  Ht 5' (  1.524 m)  Wt 178 lb 4 oz (80.854 kg)  BMI 34.81 kg/m2  SpO2 98%  Neurological Exam: MENTAL STATUS including orientation to time, place, person, recent and remote memory, attention span and concentration, language, and fund of knowledge is normal.  Speech is not dysarthric.  CRANIAL NERVES:  Pupils equal round and reactive to light.  Normal conjugate, extra-ocular eye movements in all directions of gaze.  Subtle left ptosis. Normal facial sensation.  Face is symmetric. Palate elevates symmetrically.  Tongue is midline.  MOTOR:  Motor strength is 5/5 in all extremities.  No atrophy, fasciculations or abnormal movements.  No pronator drift.  Tone is normal.    MSRs:  Right                                                                 Left brachioradialis 2+  brachioradialis 2+  biceps 2+  biceps 2+  triceps 2+  triceps 2+  patellar 1+  Patellar 1+  ankle jerk 0  ankle jerk 0  Hoffman no  Hoffman no  plantar response down  plantar response down   SENSORY:  Reduced vibration at knees and distally, following a gradient pattern.  COORDINATION/GAIT:  Normal finger-to- nose-finger and heel-to-shin.  Intact rapid alternating movements bilaterally.  Gait narrow based and stable.   Data: Lab Results  Component Value Date   HGBA1C 6.4 12/16/2014    Lab Results  Component Value Date   LXBWIOMB55 974 12/24/2013   Lab Results  Component Value Date   TSH 2.13 12/24/2013    EMG of the upper extremities and left lower 02/20/2012: Bilateral mild carpal tunnel syndrome, no evidence of neuropathy or cervical radiculopathy or lumbosacral radiculopathy  MRI/A brain wo contrast 10/04/2006: Atrophy and small vessel disease, no stenosis   IMPRESSION/PLAN: 1.  Painful diabetic neuropathy affecting the lower extremities, worsening Diabetes is well-controlled but she is under a lot of stress at home with being the primary caregiver to her husband who is bilateral amputee She is already taking gabapentin 8105m three times daily and endorse endorse some sedation, so I hesitate to further titrate this Instead, we discussed alternative options (Lyrica, TCAs, SNRIs) and decided to start nortriptyline 142mqhs x 2 weeks, then increase to 202mhs.  Further titrate as needed  2.  Seizure disorder appears that she has exclusively nocturnal grand mal seizures.  Well-controlled, last seizure 2014. MRI brain (2008) does not show any focal abnormalities. Continue oxcarbamazepine 225m34mice daily  Consider EEG, if she has a break through seizure.  Patient to call with an update in early September, if tolerating nortriptyline this can be further increased Return to clinic in [id number]-monthe duration of this appointment visit was 30 minutes of face-to-face time with the patient.  Greater than 50% of this time was spent in counseling, explanation of diagnosis, planning of further management, and coordination of care.   Thank you for allowing me to participate in patient's care.  If I can answer any additional questions, I would be pleased to do so.    Sincerely,    Donika K. PatelPosey Pronto

## 2015-01-01 NOTE — Telephone Encounter (Signed)
Pt would like to know should she take the vitamin B6 as recommended

## 2015-01-05 ENCOUNTER — Other Ambulatory Visit: Payer: Self-pay | Admitting: Internal Medicine

## 2015-01-13 ENCOUNTER — Telehealth: Payer: Self-pay | Admitting: *Deleted

## 2015-01-13 ENCOUNTER — Other Ambulatory Visit: Payer: Self-pay | Admitting: *Deleted

## 2015-01-13 MED ORDER — DULOXETINE HCL 30 MG PO CPEP: 30.0000 mg | ORAL_CAPSULE | Freq: Every day | ORAL | Status: DC

## 2015-01-13 NOTE — Telephone Encounter (Signed)
Patient called and said that the nortriptyline is not working for her and made her feel sick.  Can we try something else?

## 2015-01-13 NOTE — Telephone Encounter (Signed)
Rx sent 

## 2015-01-13 NOTE — Telephone Encounter (Signed)
We can try Cymbalta 30mg  daily and see if that helps.

## 2015-01-14 ENCOUNTER — Other Ambulatory Visit: Payer: Self-pay | Admitting: Family

## 2015-01-16 ENCOUNTER — Other Ambulatory Visit: Payer: Self-pay | Admitting: Internal Medicine

## 2015-01-20 ENCOUNTER — Other Ambulatory Visit: Payer: Self-pay | Admitting: Internal Medicine

## 2015-01-21 ENCOUNTER — Ambulatory Visit: Payer: Medicare Other | Admitting: Internal Medicine

## 2015-01-30 ENCOUNTER — Encounter: Payer: Self-pay | Admitting: Gastroenterology

## 2015-02-17 ENCOUNTER — Other Ambulatory Visit: Payer: Self-pay | Admitting: Family

## 2015-02-23 ENCOUNTER — Other Ambulatory Visit: Payer: Self-pay | Admitting: Internal Medicine

## 2015-03-16 ENCOUNTER — Other Ambulatory Visit: Payer: Self-pay | Admitting: Family

## 2015-03-30 ENCOUNTER — Other Ambulatory Visit: Payer: Self-pay

## 2015-03-30 ENCOUNTER — Other Ambulatory Visit: Payer: Self-pay | Admitting: Internal Medicine

## 2015-03-30 MED ORDER — HYDROCHLOROTHIAZIDE 12.5 MG PO CAPS: ORAL_CAPSULE | ORAL | Status: DC

## 2015-04-06 ENCOUNTER — Encounter: Payer: Self-pay | Admitting: Neurology

## 2015-04-06 ENCOUNTER — Ambulatory Visit (INDEPENDENT_AMBULATORY_CARE_PROVIDER_SITE_OTHER): Payer: 59 | Admitting: Neurology

## 2015-04-06 VITALS — BP 130/84 | HR 70 | Ht 60.0 in | Wt 179.0 lb

## 2015-04-06 DIAGNOSIS — G40909 Epilepsy, unspecified, not intractable, without status epilepticus: Secondary | ICD-10-CM

## 2015-04-06 DIAGNOSIS — E1140 Type 2 diabetes mellitus with diabetic neuropathy, unspecified: Secondary | ICD-10-CM

## 2015-04-06 MED ORDER — PREGABALIN 50 MG PO CAPS: 50.0000 mg | ORAL_CAPSULE | Freq: Three times a day (TID) | ORAL | Status: DC

## 2015-04-06 MED ORDER — PREGABALIN 75 MG PO CAPS: 75.0000 mg | ORAL_CAPSULE | Freq: Two times a day (BID) | ORAL | Status: DC

## 2015-04-06 MED ORDER — PREGABALIN 100 MG PO CAPS: 100.0000 mg | ORAL_CAPSULE | Freq: Three times a day (TID) | ORAL | Status: DC

## 2015-04-06 MED ORDER — OXCARBAZEPINE 150 MG PO TABS: 225.0000 mg | ORAL_TABLET | Freq: Two times a day (BID) | ORAL | Status: DC

## 2015-04-06 NOTE — Patient Instructions (Addendum)
1.  Adjust medication as follows:  Day 1-3:  Morning:  Gabapentin 400mg            Noon: Gabapentin 400mg   Bedtime: Gabepentin 400 mg      Morning:  Lyrica 50mg         Bedtime: Lyrica 50 mg  Day 4-6:  Morning: Gabapentin 400 mg             Noon:  Stop gabapentin   Bedtime: Gabapentin 400 mg      Morning:  Lyrica 75 mg        Bedtime: Lyrica 75 mg  Day 7-9:  Morning:   Stop gabapentin            Noon:  Stop gabapentin  Bedtime: Gabapentin 400 mg      Morning: Lyrica 100 mg              Noon: Lyrica 50 mg     Bedtime: Lyrica 100 mg  Day 10-12:   Stop gabapentin. Continue Lyrica 100mg  three times daily   Call with update in 3-4 weeks, to determine further increase in medication.  If you develop increased sleepiness or lightheadedness, stay at the lower dose.           Return to clinic in 3 months

## 2015-04-06 NOTE — Progress Notes (Signed)
Follow-up Visit   Date: 04/06/2015    [name] MRN: [id number] DOB: [birth year]   Interim History: [name] is a 74 y.o. right-handed African American female with well controlled diabetes mellitus (HbA1c 6.7), unspecified seizure disorder (diagnosed 2009, well-controlled on OXC), hypertension, GERD, depression/anxiety, and osteoarthritis returning to the clinic for follow-up of painful neuropathy.  The patient was accompanied to the clinic by self.  History of present illness: She has been diabetic for about 15 years and was initially taking insulin, but since has been maintained on oral medications. Starting around ~ 8 years ago, she noticed numbness and tingling of her toes. Over the past three years, she noticed numbness/tingling of the feet, ankles, and lower legs. On rare occasions she has burning sensation of the face. She is taking gabapentin 643m three times daily which seems to help, but does not completely alleviate the pain. Symptoms have worsened in past several years. In 2013, she underwent EMG of the upper which showed bilateral CTS.  Starting in 2009, she started having rare spells of noctural generalized seizures with her last seizure in 2014. She does not recall any details because she looses consciousness. His husband tells her that she is shaking and biting her tongue. No history of urinary or bowel incontinence. Duration is ~ 5 min. When she is awake, she is is tired and confused. She is not driving. She is currently taking oxcarbazepine 2234mtwice daily and denies any side effects. No family history of seizures.  UPDATE 01/01/2015:  She increased gabapentin to 80064mID and noticed transient improvement, but over the past several months painful paresthesias has started to increase up her legs and the severity is worse.  No new weakness.  She endorses significant stress as her husband recently had his leg amputated and now is bilateral amputee so she  is the primary caregiver.  She feels down sometimes and never has time for herself. Home health will start coming next week and she is hoping that will relieve some burden.  She also has a spell of vertigo, as if she was spinning, but this resolved with meclizine.   UPDATE 04/06/2015:  She stopped nortriptyline due to GI side effects  She continues to have painful paresthesias on gabapentin 800m27mD and is unable to titrate further due to sedation.  She is interested in Lyrica for her pain. Sometimes she feels a crawling sensation over her skin which is worse at rest and improved with activity.  No interval seizures, falls, or hospitalization.  She is otherwise doing well.    Medications:  Current Outpatient Prescriptions on File Prior to Visit  Medication Sig Dispense Refill  . ACCU-CHEK AVIVA PLUS test strip Check blood sugar two times daily 200 each 3  . albuterol (PROAIR HFA) 108 (90 BASE) MCG/ACT inhaler Inhale 2 puffs into the lungs every 6 (six) hours as needed for wheezing or shortness of breath.     . Alpha-D-Galactosidase (BEANO PO) Take 1-2 tablets by mouth daily as needed (for gas).    . ALPRAZolam (XANAX) 0.25 MG tablet Take 0.25 mg by mouth 2 (two) times daily as needed for anxiety.     . asMarland Kitchenirin 81 MG tablet Take 81 mg by mouth daily.      . azMarland Kitchenlastine (ASTELIN) 0.1 % nasal spray Place 2 sprays into both nostrils 2 (two) times daily as needed for rhinitis. Use in each nostril as directed    . Biotin 5000 MCG TABS Take 5,000  mcg by mouth daily. 30 tablet   . Black Cohosh 80 MG CAPS Take 80 capsules by mouth daily as needed (for vitamin).    . Blood Glucose Monitoring Suppl (ACCU-CHEK AVIVA PLUS) W/DEVICE KIT 1 kit by Does not apply route once. 1 kit 0  . Cholecalciferol (EQL VITAMIN D3) 1000 UNITS tablet Take 1,000 Units by mouth daily as needed (for vitamin).     . Cyanocobalamin (VITAMIN B 12) 100 MCG LOZG Take 100 mcg by mouth daily as needed (for vitamin).     . cyclobenzaprine  (FLEXERIL) 5 MG tablet TAKE 1 TABLET (5 MG TOTAL) BY MOUTH 3 (THREE) TIMES DAILY AS NEEDED FOR MUSCLE SPASMS. 30 tablet 1  . DULoxetine (CYMBALTA) 30 MG capsule Take 1 capsule (30 mg total) by mouth daily. 30 capsule 1  . famotidine (PEPCID) 20 MG tablet Take 1 tablet (20 mg total) by mouth daily. 90 tablet 1  . gabapentin (NEURONTIN) 800 MG tablet Take 1 tablet (800 mg total) by mouth 3 (three) times daily. 270 tablet 3  . glipiZIDE (GLUCOTROL XL) 5 MG 24 hr tablet TAKE 1 TABLET (5 MG TOTAL) BY MOUTH DAILY. 90 tablet 3  . hydrochlorothiazide (MICROZIDE) 12.5 MG capsule TAKE 1 CAPSULE (12.5 MG TOTAL) BY MOUTH DAILY AS NEEDED FOR EDEMA 90 capsule 0  . HYDROcodone-acetaminophen (NORCO) 5-325 MG per tablet Take 0.5-1 tablets by mouth every 8 (eight) hours as needed. 60 tablet 0  . lidocaine (XYLOCAINE) 5 % ointment Apply 1 application topically 2 (two) times daily as needed. Use gloves when applying to feet 35.44 g 5  . losartan (COZAAR) 50 MG tablet Take 1 tablet (50 mg total) by mouth daily. 30 tablet 4  . meclizine (ANTIVERT) 25 MG tablet Take 1 tablet (25 mg total) by mouth 3 (three) times daily as needed for dizziness. 30 tablet 0  . meclizine (ANTIVERT) 25 MG tablet Take 1 tablet (25 mg total) by mouth 2 (two) times daily as needed for dizziness. 30 tablet 2  . meclizine (ANTIVERT) 25 MG tablet Take 1 tablet (25 mg total) by mouth 2 (two) times daily as needed for dizziness or nausea. 30 tablet 0  . metFORMIN (GLUCOPHAGE-XR) 500 MG 24 hr tablet TAKE 1 TABLET (500 MG TOTAL) BY MOUTH DAILY WITH BREAKFAST. 90 tablet 3  . metroNIDAZOLE (FLAGYL) 500 MG tablet Take 1 tablet (500 mg total) by mouth every 8 (eight) hours. 30 tablet 0  . Multiple Vitamin (MULTIVITAMIN) capsule Take 1 capsule by mouth daily as needed (for vitamin).     . nortriptyline (PAMELOR) 10 MG capsule Take 1 tablet at bedtime for two weeks, then increase to 2 tablets at bedtime 60 capsule 3  . ondansetron (ZOFRAN-ODT) 8 MG  disintegrating tablet Take 1 tablet (8 mg total) by mouth every 8 (eight) hours as needed for nausea or vomiting. 20 tablet 0  . PARoxetine (PAXIL) 10 MG tablet Take 10 mg by mouth daily as needed (for mood).    . polyethylene glycol powder (GLYCOLAX/MIRALAX) powder Take 17 g by mouth daily. 500 g 0  . Probiotic Product (PROBIOTIC PO) Take 1 capsule by mouth daily after breakfast.     . traMADol (ULTRAM) 50 MG tablet TAKE 1 TABLET BY MOUTH EVERY 6 HOURS AS NEEDED FOR PAIN 30 tablet 0   No current facility-administered medications on file prior to visit.    Allergies:  Allergies  Allergen Reactions  . Other     "SEEDED" food due to stomach issues  .  Statins Nausea And Vomiting  . Penicillins Hives and Rash    Review of Systems:  CONSTITUTIONAL: No fevers, chills, night sweats, or weight loss.  EYES: No visual changes or eye pain ENT: No hearing changes.  No history of nose bleeds.   RESPIRATORY: No cough, wheezing and shortness of breath.   CARDIOVASCULAR: Negative for chest pain, and palpitations.   GI: Negative for abdominal discomfort, blood in stools or black stools.  No recent change in bowel habits.   GU:  No history of incontinence.   MUSCLOSKELETAL: +history of joint pain or swelling.  No myalgias.   SKIN: Negative for lesions, rash, and itching.   ENDOCRINE: Negative for cold or heat intolerance, polydipsia or goiter.   PSYCH:  + depression or anxiety symptoms.   NEURO: As Above.   Vital Signs:  BP 130/84 mmHg  Pulse 70  Ht 5' (1.524 m)  Wt 179 lb (81.194 kg)  BMI 34.96 kg/m2  SpO2 99%  Neurological Exam: MENTAL STATUS including orientation to time, place, person, recent and remote memory, attention span and concentration, language, and fund of knowledge is normal.  Speech is not dysarthric.  CRANIAL NERVES:  Pupils equal round and reactive to light.  Normal conjugate, extra-ocular eye movements in all directions of gaze.  Subtle left ptosis. Face is symmetric.  Palate elevates symmetrically.  Tongue is midline.  MOTOR:  Motor strength is 5/5 in all extremities.   MSRs:  Right                                                                 Left brachioradialis 2+  brachioradialis 2+  biceps 2+  biceps 2+  triceps 2+  triceps 2+  patellar 1+  Patellar 1+  ankle jerk 0  ankle jerk 0  Hoffman no  Hoffman no  plantar response down  plantar response down   SENSORY:  Reduced vibration at knees and distally, following a gradient pattern.  COORDINATION/GAIT:   Gait narrow based and stable.   Data: Lab Results  Component Value Date   HGBA1C 6.4 12/16/2014   Lab Results  Component Value Date   [id number] 017 12/24/2013   Lab Results  Component Value Date   TSH 2.13 12/24/2013    EMG of the upper extremities and left lower 02/20/2012: Bilateral mild carpal tunnel syndrome, no evidence of neuropathy or cervical radiculopathy or lumbosacral radiculopathy  MRI/A brain wo contrast 10/04/2006: Atrophy and small vessel disease, no stenosis   IMPRESSION/PLAN: 1.  Painful diabetic neuropathy affecting the lower extremities, worsening Diabetes is well-controlled but she is under a lot of stress at home with being the primary caregiver to her husband who is bilateral amputee She is already taking gabapentin 818m three times daily and endorse endorse some sedation, so I hesitate to further titrate this Previously tried:  Nortriptyline (stopped due to GI side effects) She is interested in Lyrica, so I provided schedule of how to reduce gabapentin and start Lyrica over the next 2 weeks to goal of Lyrica 1045mTID.  Samples provided for 5060mnd 63m48mblets as she increases the dose.  Common side effects discussed.  Further titrate as needed  2.  Seizure disorder appears that she has exclusively nocturnal grand mal seizures.  Well-controlled, last  seizure 01/2013. MRI brain (2008) does not show any focal abnormalities. Continue oxcarbamazepine 264m  twice daily  Consider EEG, if she has a break through seizure.  Patient to call with an update in early September, if tolerating nortriptyline this can be further increased  Return to clinic in 311-month or sooner as needed  The duration of this appointment visit was 25 minutes of face-to-face time with the patient.  Greater than 50% of this time was spent in counseling, explanation of diagnosis, planning of further management, and coordination of care.   Thank you for allowing me to participate in patient's care.  If I can answer any additional questions, I would be pleased to do so.    Sincerely,    Donika K. PaPosey ProntoDO

## 2015-04-16 ENCOUNTER — Telehealth: Payer: Self-pay | Admitting: Neurology

## 2015-04-16 NOTE — Telephone Encounter (Signed)
Pt states that she needs to talk to someone today about her Lyrica change please call [phone]

## 2015-04-17 ENCOUNTER — Other Ambulatory Visit: Payer: Self-pay | Admitting: *Deleted

## 2015-04-17 ENCOUNTER — Telehealth: Payer: Self-pay | Admitting: *Deleted

## 2015-04-17 MED ORDER — PREGABALIN 100 MG PO CAPS: 100.0000 mg | ORAL_CAPSULE | Freq: Three times a day (TID) | ORAL | Status: DC

## 2015-04-17 MED ORDER — PREGABALIN 50 MG PO CAPS: 100.0000 mg | ORAL_CAPSULE | Freq: Three times a day (TID) | ORAL | Status: DC

## 2015-04-17 MED ORDER — PREGABALIN 50 MG PO CAPS: 50.0000 mg | ORAL_CAPSULE | Freq: Three times a day (TID) | ORAL | Status: DC

## 2015-04-17 NOTE — Telephone Encounter (Signed)
PT called and needs a [facility] [place] CB# [phone]

## 2015-04-17 NOTE — Telephone Encounter (Signed)
Pt called for info on Lyrica 50mg //call back @ [phone]

## 2015-04-17 NOTE — Telephone Encounter (Signed)
See previous note

## 2015-04-17 NOTE — Telephone Encounter (Signed)
Spoke with patient and [facility]

## 2015-05-19 ENCOUNTER — Encounter: Payer: Self-pay | Admitting: Internal Medicine

## 2015-05-19 ENCOUNTER — Ambulatory Visit (INDEPENDENT_AMBULATORY_CARE_PROVIDER_SITE_OTHER): Payer: Medicare Other | Admitting: Internal Medicine

## 2015-05-19 ENCOUNTER — Ambulatory Visit: Payer: 59 | Admitting: Internal Medicine

## 2015-05-19 VITALS — BP 110/70 | HR 60 | Temp 98.1°F | Resp 16 | Ht 61.5 in | Wt 182.0 lb

## 2015-05-19 DIAGNOSIS — M7918 Myalgia, other site: Secondary | ICD-10-CM | POA: Insufficient documentation

## 2015-05-19 DIAGNOSIS — G4700 Insomnia, unspecified: Secondary | ICD-10-CM

## 2015-05-19 DIAGNOSIS — E1140 Type 2 diabetes mellitus with diabetic neuropathy, unspecified: Secondary | ICD-10-CM | POA: Diagnosis not present

## 2015-05-19 DIAGNOSIS — I10 Essential (primary) hypertension: Secondary | ICD-10-CM

## 2015-05-19 DIAGNOSIS — M797 Fibromyalgia: Secondary | ICD-10-CM | POA: Diagnosis not present

## 2015-05-19 HISTORY — DX: Myalgia, other site: M79.18

## 2015-05-19 MED ORDER — CYCLOBENZAPRINE HCL 5 MG PO TABS: ORAL_TABLET | ORAL | Status: DC

## 2015-05-19 MED ORDER — PREGABALIN 50 MG PO CAPS: 150.0000 mg | ORAL_CAPSULE | Freq: Three times a day (TID) | ORAL | Status: DC

## 2015-05-19 NOTE — Assessment & Plan Note (Signed)
BP Readings from Last 3 Encounters:  05/19/15 110/70  04/06/15 130/84  01/01/15 120/68   Blood pressure well-controlled Continue current medications

## 2015-05-19 NOTE — Patient Instructions (Signed)
  We have reviewed your prior records including labs and tests today.  Test(s) ordered today. Your results will be released to [place] (or called to you) after review, usually within 72hours after test completion. If any changes need to be made, you will be notified at that same time.  Medications reviewed and updated.  Restart the flexeril as needed.  We will increase the lyrica to 150 mg three times a day.    Your prescription(s) have been submitted to your pharmacy. Please take as directed and contact our office if you believe you are having problem(s) with the medication(s).  Please schedule followup in 6 months

## 2015-05-19 NOTE — Progress Notes (Signed)
Subjective:    Patient ID: [name], female    DOB: 1941/05/19, 74 y.o.   MRN: [id number]  HPI She is here to establish a new primary care physician and for wellness visit.  Sleep apnea: She does not use her CPAP nightly. When she does use it sometimes helps with her sleep, but not always.  Diabetes with neuropathy: She is a burning type pain in her feet, but more recently has been throughout her body-even in her arms and face. She is seen a neurologist. She was on gabapentin, but this did not seem to help and was recently switched to Lyrica. She wonders if it may have helped initially, but she still feels back pain. She is not exercising regularly. Her diabetes is controlled.  She is taking her diabetic medications daily as prescribed.  Muscle spasms, tightness in her neck and upper back: She has some chronic tightness and spasms in her muscles, especially in her upper back and neck. She has taken a muscle relaxer in the past. She is wondering if there is anything that would help her pain. She does take Advil, but it does not seem to help.  She does not sleep well-she wakes frequently. She is currently not exercising. She does tend to eat healthy. She does have a lot of stress in her life because her husband is disabled and she is his caretaker.  Hypertension: She is taking her medication daily. She is compliant with a low sodium diet.  She denies chest pain, palpitations, edema, and regular headaches. She is not exercising regularly.    Medications and allergies reviewed with patient and updated if appropriate.  Patient Active Problem List   Diagnosis Date Noted  . Lower extremity edema 12/16/2014  . Pelvic cyst   . Diverticulitis large intestine w/o perforation or abscess w/o bleeding 05/12/2014  . Essential hypertension 05/12/2014  . DM neuropathy, type II diabetes mellitus ([place]) 05/12/2014  . OSA on CPAP 05/12/2014  . Depression 05/12/2014  . Seizure disorder ([place])  05/12/2014  . Abnormal CT scan, pelvis 05/12/2014  . Hepatic steatosis 05/12/2014  . Obesity (BMI 30-39.9) 05/12/2014  . Hyponatremia   . Cerebrovascular disease, unspecified 03/13/2013  . Allergic rhinitis, cause unspecified 03/13/2013  . FATIGUE 05/25/2009  . GOITER, MULTINODULAR 05/18/2009  . Dysthymia 04/13/2009  . VERTIGO 04/13/2009  . Irritable bowel syndrome 03/20/2009  . INSOMNIA 03/20/2009  . Type II or unspecified type diabetes mellitus with neurological manifestations, not stated as uncontrolled(250.60) 03/18/2009  . DYSLIPIDEMIA 03/18/2009  . SLEEP APNEA, OBSTRUCTIVE 03/18/2009  . HYPERTENSION 03/18/2009  . GERD 03/18/2009  . OSTEOARTHRITIS 03/18/2009  . SEIZURE DISORDER 03/18/2009    Current Outpatient Prescriptions on File Prior to Visit  Medication Sig Dispense Refill  . ACCU-CHEK AVIVA PLUS test strip Check blood sugar two times daily 200 each 3  . albuterol (PROAIR HFA) 108 (90 BASE) MCG/ACT inhaler Inhale 2 puffs into the lungs every 6 (six) hours as needed for wheezing or shortness of breath.     . Alpha-D-Galactosidase (BEANO PO) Take 1-2 tablets by mouth daily as needed (for gas).    . ALPRAZolam (XANAX) 0.25 MG tablet Take 0.25 mg by mouth 2 (two) times daily as needed for anxiety.     Marland Kitchen aspirin 81 MG tablet Take 81 mg by mouth daily.      Marland Kitchen azelastine (ASTELIN) 0.1 % nasal spray Place 2 sprays into both nostrils 2 (two) times daily as needed for rhinitis. Use in each  nostril as directed    . Biotin 5000 MCG TABS Take 5,000 mcg by mouth daily. (Patient taking differently: Take 1,000 mcg by mouth daily. ) 30 tablet   . Black Cohosh 80 MG CAPS Take 80 capsules by mouth daily as needed (for vitamin).    . Blood Glucose Monitoring Suppl (ACCU-CHEK AVIVA PLUS) W/DEVICE KIT 1 kit by Does not apply route once. 1 kit 0  . Cholecalciferol (EQL VITAMIN D3) 1000 UNITS tablet Take 1,000 Units by mouth daily as needed (for vitamin).     . Cyanocobalamin (VITAMIN B 12) 100  MCG LOZG Take 100 mcg by mouth daily as needed (for vitamin).     . cyclobenzaprine (FLEXERIL) 5 MG tablet TAKE 1 TABLET (5 MG TOTAL) BY MOUTH 3 (THREE) TIMES DAILY AS NEEDED FOR MUSCLE SPASMS. 30 tablet 1  . famotidine (PEPCID) 20 MG tablet Take 1 tablet (20 mg total) by mouth daily. 90 tablet 1  . glipiZIDE (GLUCOTROL XL) 5 MG 24 hr tablet TAKE 1 TABLET (5 MG TOTAL) BY MOUTH DAILY. 90 tablet 3  . hydrochlorothiazide (MICROZIDE) 12.5 MG capsule TAKE 1 CAPSULE (12.5 MG TOTAL) BY MOUTH DAILY AS NEEDED FOR EDEMA 90 capsule 0  . HYDROcodone-acetaminophen (NORCO) 5-325 MG per tablet Take 0.5-1 tablets by mouth every 8 (eight) hours as needed. 60 tablet 0  . lidocaine (XYLOCAINE) 5 % ointment Apply 1 application topically 2 (two) times daily as needed. Use gloves when applying to feet 35.44 g 5  . losartan (COZAAR) 50 MG tablet Take 1 tablet (50 mg total) by mouth daily. 30 tablet 4  . meclizine (ANTIVERT) 25 MG tablet Take 1 tablet (25 mg total) by mouth 2 (two) times daily as needed for dizziness or nausea. 30 tablet 0  . metFORMIN (GLUCOPHAGE-XR) 500 MG 24 hr tablet TAKE 1 TABLET (500 MG TOTAL) BY MOUTH DAILY WITH BREAKFAST. 90 tablet 3  . Multiple Vitamin (MULTIVITAMIN) capsule Take 1 capsule by mouth daily as needed (for vitamin).     . ondansetron (ZOFRAN-ODT) 8 MG disintegrating tablet Take 1 tablet (8 mg total) by mouth every 8 (eight) hours as needed for nausea or vomiting. 20 tablet 0  . OXcarbazepine (TRILEPTAL) 150 MG tablet Take 1.5 tablets (225 mg total) by mouth 2 (two) times daily. 140 tablet 3  . PARoxetine (PAXIL) 10 MG tablet Take 10 mg by mouth daily as needed (for mood).    . polyethylene glycol powder (GLYCOLAX/MIRALAX) powder Take 17 g by mouth daily. 500 g 0  . pregabalin (LYRICA) 50 MG capsule Take 2 capsules (100 mg total) by mouth 3 (three) times daily. 90 capsule 2  . Probiotic Product (PROBIOTIC PO) Take 1 capsule by mouth daily after breakfast.     . DULoxetine (CYMBALTA) 30  MG capsule Take 1 capsule (30 mg total) by mouth daily. (Patient not taking: Reported on 05/19/2015) 30 capsule 1  . gabapentin (NEURONTIN) 800 MG tablet Take 1 tablet (800 mg total) by mouth 3 (three) times daily. (Patient not taking: Reported on 05/19/2015) 270 tablet 3  . pregabalin (LYRICA) 100 MG capsule Take 1 capsule (100 mg total) by mouth 3 (three) times daily. (Patient not taking: Reported on 05/19/2015) 90 capsule 5   No current facility-administered medications on file prior to visit.    Past Medical History  Diagnosis Date  . Diabetes mellitus, type 2 ([place])   . GERD (gastroesophageal reflux disease)   . Depression   . Seizure disorder ([place])   . Dyslipidemia   .  OSA on CPAP   . Osteoarthritis of shoulder region     and Knee  . Anxiety   . Diverticulosis of colon 03/2010 hosp  . Hypertension   . Cerebrovascular disease, unspecified 03/13/2013    Atrophy and small vessel dz noted, MR brain 2009  . Allergic rhinitis, cause unspecified 03/13/2013  . Neuropathy [facility])     Past Surgical History  Procedure Laterality Date  . Cholecystectomy    . Abdominal hysterectomy  1970's    Partial  . Appendectomy    . Tonsillectomy and adenoidectomy    . Shoulder surgery  2008    LT, post fall     Social History   Social History  . Marital Status: Married    Spouse Name: N/A  . Number of Children: N/A  . Years of Education: N/A   Social History Main Topics  . Smoking status: Former Smoker    Quit date: 10/19/1985  . Smokeless tobacco: Never Used  . Alcohol Use: 0.0 oz/week    0 Standard drinks or equivalent per week  . Drug Use: No  . Sexual Activity: Not Asked   Other Topics Concern  . None   Social History Narrative   Patient lives in a one story home with her husband.  Has 2 children.  Retired from [employer].    Review of Systems  Constitutional: Negative for fever and chills.  HENT: Negative for congestion and sore throat.   Respiratory: Positive for shortness  of breath (sometimes with moderate exertion). Negative for cough and wheezing.   Cardiovascular: Positive for chest pain (no pain, heaviness at times).  Neurological: Positive for dizziness, light-headedness and headaches.  Psychiatric/Behavioral: Negative for dysphoric mood. The patient is nervous/anxious.        Objective:   Filed Vitals:   05/19/15 1025  BP: 110/70  Pulse: 60  Temp: 98.1 F (36.7 C)  Resp: 16   Filed Weights   05/19/15 1025  Weight: 182 lb (82.555 kg)   Body mass index is 33.84 kg/(m^2).   Physical Exam Constitutional: Appears well-developed and well-nourished. No distress.  Neck: Neck supple. No tracheal deviation present. No thyromegaly present.  No carotid bruit. No cervical adenopathy.   Cardiovascular: Normal rate, regular rhythm and normal heart sounds.   No murmur heard. Pulmonary/Chest: Effort normal and breath sounds normal. No respiratory distress. No wheezes.  Musculoskeletal: No edema. muscle tightness/tenderness with palpation in upper back and neck Psychiatric: Mood and affect normal          Assessment & Plan:   See problem list for a/p  We'll also try a muscle relaxer for her upper back and neck pain

## 2015-05-19 NOTE — Assessment & Plan Note (Signed)
We will try a muscle relaxer to see if that helps Stressed regular exercise

## 2015-05-19 NOTE — Progress Notes (Signed)
Pre visit review using our clinic review tool, if applicable. No additional management support is needed unless otherwise documented below in the visit note. 

## 2015-05-19 NOTE — Assessment & Plan Note (Signed)
Stressed the importance of regular exercise Continue CPAP We will try muscle relaxer-her muscle pain may be contributing

## 2015-05-19 NOTE — Assessment & Plan Note (Signed)
Lab Results  Component Value Date   HGBA1C 6.4 12/16/2014   Diabetes well controlled. She does have some neuropathy and is following with neurology Lyrica 100 mg 3 times daily -? Helping We will try increasing slowly over several days to 150 mg 3 times daily Encouraged regular exercise, weight loss

## 2015-05-20 ENCOUNTER — Telehealth: Payer: Self-pay | Admitting: Emergency Medicine

## 2015-05-20 NOTE — Telephone Encounter (Signed)
[facility]

## 2015-06-04 ENCOUNTER — Telehealth: Payer: Self-pay | Admitting: Neurology

## 2015-06-04 NOTE — Telephone Encounter (Signed)
VM-PT called in regards to present medication and would like a call back today at [phone]

## 2015-06-04 NOTE — Telephone Encounter (Signed)
Patient can't afford the Lyrica.  Can she go back on her gabapentin?  She was on 800 mg tid but it made her sleepy so she would like a weaker dose.  She has 2 Lyrica left.  Please advise.    Patient will be home until 12:00 tomorrow.  If she has not heard from nurse by then, she will call back tomorrow afternoon.

## 2015-06-05 NOTE — Telephone Encounter (Signed)
Error; will close

## 2015-06-05 NOTE — Telephone Encounter (Signed)
If she doesn't qualify for patient assist for lyrica (can you please check for her) then go to gabapentin 600 mg tid.  I think that the real issue is that while 800 mg tid made her sleepy, she was still having pain so going down on it probably is not going to provide enough pain relief.  She can certainly discuss more with Dr. Posey Pronto at next visit and make sure that she has one scheduled.

## 2015-06-08 NOTE — Telephone Encounter (Signed)
Patient is going to go back to the 800 mg tid.  She said that she can handle the sleepiness.  Instructed her to call if she needs anything else.

## 2015-07-20 ENCOUNTER — Ambulatory Visit: Payer: Medicare Other | Admitting: Neurology

## 2015-07-30 ENCOUNTER — Other Ambulatory Visit: Payer: Self-pay | Admitting: Family

## 2015-07-30 ENCOUNTER — Other Ambulatory Visit: Payer: Self-pay | Admitting: Internal Medicine

## 2015-08-24 ENCOUNTER — Other Ambulatory Visit: Payer: Self-pay

## 2015-08-24 DIAGNOSIS — Z1231 Encounter for screening mammogram for malignant neoplasm of breast: Secondary | ICD-10-CM

## 2015-09-03 ENCOUNTER — Other Ambulatory Visit: Payer: Self-pay | Admitting: Internal Medicine

## 2015-09-10 ENCOUNTER — Ambulatory Visit
Admission: RE | Admit: 2015-09-10 | Discharge: 2015-09-10 | Disposition: A | Discharge status: Home / Self Care | Payer: Medicare Other | Source: Ambulatory Visit

## 2015-09-10 DIAGNOSIS — Z1231 Encounter for screening mammogram for malignant neoplasm of breast: Secondary | ICD-10-CM

## 2015-09-14 ENCOUNTER — Ambulatory Visit (INDEPENDENT_AMBULATORY_CARE_PROVIDER_SITE_OTHER): Payer: Medicare Other | Admitting: Internal Medicine

## 2015-09-14 ENCOUNTER — Other Ambulatory Visit (INDEPENDENT_AMBULATORY_CARE_PROVIDER_SITE_OTHER): Payer: Medicare Other

## 2015-09-14 ENCOUNTER — Encounter: Payer: Self-pay | Admitting: Internal Medicine

## 2015-09-14 VITALS — BP 120/74 | HR 74 | Temp 98.3°F | Resp 16 | Wt 180.0 lb

## 2015-09-14 DIAGNOSIS — R42 Dizziness and giddiness: Secondary | ICD-10-CM

## 2015-09-14 DIAGNOSIS — F32A Depression, unspecified: Secondary | ICD-10-CM

## 2015-09-14 DIAGNOSIS — K588 Other irritable bowel syndrome: Secondary | ICD-10-CM

## 2015-09-14 DIAGNOSIS — E1140 Type 2 diabetes mellitus with diabetic neuropathy, unspecified: Secondary | ICD-10-CM

## 2015-09-14 DIAGNOSIS — Z23 Encounter for immunization: Secondary | ICD-10-CM

## 2015-09-14 DIAGNOSIS — I10 Essential (primary) hypertension: Secondary | ICD-10-CM | POA: Diagnosis not present

## 2015-09-14 DIAGNOSIS — F418 Other specified anxiety disorders: Secondary | ICD-10-CM | POA: Insufficient documentation

## 2015-09-14 DIAGNOSIS — K219 Gastro-esophageal reflux disease without esophagitis: Secondary | ICD-10-CM | POA: Diagnosis not present

## 2015-09-14 DIAGNOSIS — F329 Major depressive disorder, single episode, unspecified: Secondary | ICD-10-CM

## 2015-09-14 DIAGNOSIS — G4700 Insomnia, unspecified: Secondary | ICD-10-CM

## 2015-09-14 DIAGNOSIS — F419 Anxiety disorder, unspecified: Secondary | ICD-10-CM

## 2015-09-14 LAB — COMPREHENSIVE METABOLIC PANEL
ALT: 22 U/L (ref 0–35)
AST: 25 U/L (ref 0–37)
Albumin: 4.4 g/dL (ref 3.5–5.2)
Alkaline Phosphatase: 67 U/L (ref 39–117)
BUN: 12 mg/dL (ref 6–23)
CHLORIDE: 95 meq/L — AB (ref 96–112)
CO2: 33 meq/L — AB (ref 19–32)
Calcium: 10.4 mg/dL (ref 8.4–10.5)
Creatinine, Ser: 0.78 mg/dL (ref 0.40–1.20)
GFR: 93.17 mL/min (ref >60.00)
GLUCOSE: 84 mg/dL (ref 70–99)
Potassium: 3.2 mEq/L — ABNORMAL LOW (ref 3.5–5.1)
SODIUM: 136 meq/L (ref 135–145)
TOTAL PROTEIN: 7.8 g/dL (ref 6.0–8.3)
Total Bilirubin: 0.3 mg/dL (ref 0.2–1.2)

## 2015-09-14 LAB — CBC WITH DIFFERENTIAL/PLATELET
Basophils Absolute: 0.1 10*3/uL (ref 0.0–0.1)
Basophils Relative: 0.9 % (ref 0.0–3.0)
EOS PCT: 1.6 % (ref 0.0–5.0)
Eosinophils Absolute: 0.1 10*3/uL (ref 0.0–0.7)
HCT: 38.7 % (ref 36.0–46.0)
Hemoglobin: 13 g/dL (ref 12.0–15.0)
LYMPHS ABS: 4.2 10*3/uL — AB (ref 0.7–4.0)
Lymphocytes Relative: 48 % — ABNORMAL HIGH (ref 12.0–46.0)
MCHC: 33.7 g/dL (ref 30.0–36.0)
MCV: 89.5 fl (ref 78.0–100.0)
MONO ABS: 0.7 10*3/uL (ref 0.1–1.0)
Monocytes Relative: 8.5 % (ref 3.0–12.0)
NEUTROS ABS: 3.6 10*3/uL (ref 1.4–7.7)
NEUTROS PCT: 41 % — AB (ref 43.0–77.0)
PLATELETS: 309 10*3/uL (ref 150.0–400.0)
RBC: 4.32 Mil/uL (ref 3.87–5.11)
RDW: 14.1 % (ref 11.5–15.5)
WBC: 8.8 10*3/uL (ref 4.0–10.5)

## 2015-09-14 LAB — TSH: TSH: 2.65 u[IU]/mL (ref 0.35–4.50)

## 2015-09-14 LAB — HEMOGLOBIN A1C: Hgb A1c MFr Bld: 7.2 % — ABNORMAL HIGH (ref 4.6–6.5)

## 2015-09-14 MED ORDER — PAROXETINE HCL 10 MG PO TABS: 10.0000 mg | ORAL_TABLET | Freq: Every day | ORAL | Status: DC

## 2015-09-14 MED ORDER — TRAZODONE HCL 50 MG PO TABS: 50.0000 mg | ORAL_TABLET | Freq: Every day | ORAL | Status: DC

## 2015-09-14 MED ORDER — MECLIZINE HCL 12.5 MG PO TABS: 12.5000 mg | ORAL_TABLET | Freq: Three times a day (TID) | ORAL | Status: DC | PRN

## 2015-09-14 MED ORDER — RANITIDINE HCL 150 MG PO TABS: 150.0000 mg | ORAL_TABLET | Freq: Two times a day (BID) | ORAL | Status: DC

## 2015-09-14 NOTE — Patient Instructions (Addendum)
Try taking antivert which is an over the counter dizziness medication that is the same as meclizine.     Test(s) ordered today. Your results will be released to [place] (or called to you) after review, usually within 72hours after test completion. If any changes need to be made, you will be notified at that same time.   Pneumonia vaccine administered today.   Medications reviewed and updated.  Changes include starting paxil at 10 mg daily.  We will also try trazodone for your sleep.   Take that at night.   Continue zantac for your heartburn - take twice daily.   Your prescription(s) have been submitted to your pharmacy. Please take as directed and contact our office if you believe you are having problem(s) with the medication(s).   Please followup in 6-8 weeks

## 2015-09-14 NOTE — Assessment & Plan Note (Signed)
Not controlled Not currently on medication Start Paxil 10 mg daily

## 2015-09-14 NOTE — Assessment & Plan Note (Signed)
Related to husband's health and being his caregiver, not taking care of her own health and overall stress Not controlled Not currently on medication Start Paxil 10 mg daily

## 2015-09-14 NOTE — Progress Notes (Signed)
Pre visit review using our clinic review tool, if applicable. No additional management support is needed unless otherwise documented below in the visit note. 

## 2015-09-14 NOTE — Assessment & Plan Note (Signed)
Meclizine no longer covered by [employer], minerals over-the-counter

## 2015-09-14 NOTE — Progress Notes (Signed)
Subjective:    Patient ID: [name], female    DOB: 11-07-1940, 75 y.o.   MRN: [id number]  HPI She is here for routine follow-up.  IBS:  She has a burning sensation in colon.  She does not feel she has a normal bowel movement like she once did.  She has a lot of bloating and gas in that occurs no matter what she eats.  She is having a lot GERD.  She is taking zantac that she gets otc - she is unsure of the dose.  She uses a laxative only as needed. She eats prunes.  She is experiencing a lot of anxiety and stress.  Difficulty sleeping:  She is not sleeping well.  She is exhausted when she wakes up because she is not sleeping well. She has a lot of stress and anxiety related to her husband's health and being his caregiver.  Anxiety:  Her anxiety level has been high since her husband had his second wife amputated. He has not been doing well and may need surgery again. She is currently not on an antianxiety medication, but feels she needs something.  OA:  She has seen Dr Posey Pronto.  She has been on lyrica and it did not help.  She went back to gabapentin.    Diabetes: She is taking her medication daily as prescribed. She is not compliant with a diabetic diet. She is not exercising regularly. She monitors her sugars and it was 128 this morning, which is higher than usual.   Hypertension: She is taking her medication daily. She is compliant with a low sodium diet.  She had one episode of chest pain related to heartburn and an episode of palpitations probably related to stress. She has had mild cough and wheeze related to an upper respiratory infection. She denies shortness of breath. She is not exercising regularly.  She does not monitor her blood pressure at home.       Medications and allergies reviewed with patient and updated if appropriate.  Patient Active Problem List   Diagnosis Date Noted  . Muscle pain, myofacial 05/19/2015  . Lower extremity edema 12/16/2014  . Pelvic cyst   .  Diverticulitis large intestine w/o perforation or abscess w/o bleeding 05/12/2014  . Essential hypertension 05/12/2014  . DM neuropathy, type II diabetes mellitus ([place]) 05/12/2014  . OSA on CPAP 05/12/2014  . Depression 05/12/2014  . Seizure disorder ([place]) 05/12/2014  . Abnormal CT scan, pelvis 05/12/2014  . Hepatic steatosis 05/12/2014  . Obesity (BMI 30-39.9) 05/12/2014  . Hyponatremia   . Cerebrovascular disease, unspecified 03/13/2013  . Allergic rhinitis, cause unspecified 03/13/2013  . FATIGUE 05/25/2009  . GOITER, MULTINODULAR 05/18/2009  . Dysthymia 04/13/2009  . VERTIGO 04/13/2009  . Irritable bowel syndrome 03/20/2009  . INSOMNIA 03/20/2009  . DYSLIPIDEMIA 03/18/2009  . SLEEP APNEA, OBSTRUCTIVE 03/18/2009  . GERD 03/18/2009  . OSTEOARTHRITIS 03/18/2009  . SEIZURE DISORDER 03/18/2009    Current Outpatient Prescriptions on File Prior to Visit  Medication Sig Dispense Refill  . ACCU-CHEK AVIVA PLUS test strip Check blood sugar two times daily 200 each 3  . albuterol (PROAIR HFA) 108 (90 BASE) MCG/ACT inhaler Inhale 2 puffs into the lungs every 6 (six) hours as needed for wheezing or shortness of breath.     . Alpha-D-Galactosidase (BEANO PO) Take 1-2 tablets by mouth daily as needed (for gas).    . ALPRAZolam (XANAX) 0.25 MG tablet Take 0.25 mg by mouth 2 (two) times  daily as needed for anxiety.     Marland Kitchen aspirin 81 MG tablet Take 81 mg by mouth daily.      Marland Kitchen azelastine (ASTELIN) 0.1 % nasal spray Place 2 sprays into both nostrils 2 (two) times daily as needed for rhinitis. Use in each nostril as directed    . Biotin 5000 MCG TABS Take 5,000 mcg by mouth daily. (Patient taking differently: Take 1,000 mcg by mouth daily. ) 30 tablet   . Black Cohosh 80 MG CAPS Take 80 capsules by mouth daily as needed (for vitamin).    . Blood Glucose Monitoring Suppl (ACCU-CHEK AVIVA PLUS) W/DEVICE KIT 1 kit by Does not apply route once. 1 kit 0  . Cholecalciferol (EQL VITAMIN D3) 1000 UNITS  tablet Take 1,000 Units by mouth daily as needed (for vitamin).     . Cyanocobalamin (VITAMIN B 12) 100 MCG LOZG Take 100 mcg by mouth daily as needed (for vitamin).     . cyclobenzaprine (FLEXERIL) 5 MG tablet TAKE 1 TABLET (5 MG TOTAL) BY MOUTH 3 (THREE) TIMES DAILY AS NEEDED FOR MUSCLE SPASMS. 90 tablet 1  . famotidine (PEPCID) 20 MG tablet TAKE 1 TABLET (20 MG TOTAL) BY MOUTH DAILY. 90 tablet 1  . glipiZIDE (GLUCOTROL XL) 5 MG 24 hr tablet TAKE 1 TABLET (5 MG TOTAL) BY MOUTH DAILY. 90 tablet 3  . hydrochlorothiazide (MICROZIDE) 12.5 MG capsule TAKE 1 CAPSULE (12.5 MG TOTAL) BY MOUTH DAILY AS NEEDED FOR EDEMA 90 capsule 0  . lidocaine (XYLOCAINE) 5 % ointment Apply 1 application topically 2 (two) times daily as needed. Use gloves when applying to feet 35.44 g 5  . losartan (COZAAR) 50 MG tablet Take 1 tablet (50 mg total) by mouth daily. 30 tablet 4  . meclizine (ANTIVERT) 25 MG tablet Take 1 tablet (25 mg total) by mouth 2 (two) times daily as needed for dizziness or nausea. 30 tablet 0  . metFORMIN (GLUCOPHAGE-XR) 500 MG 24 hr tablet TAKE 1 TABLET (500 MG TOTAL) BY MOUTH DAILY WITH BREAKFAST. 90 tablet 3  . Multiple Vitamin (MULTIVITAMIN) capsule Take 1 capsule by mouth daily as needed (for vitamin).     . OXcarbazepine (TRILEPTAL) 150 MG tablet Take 1.5 tablets (225 mg total) by mouth 2 (two) times daily. 140 tablet 3  . PARoxetine (PAXIL) 10 MG tablet Take 10 mg by mouth daily as needed (for mood).    . polyethylene glycol powder (GLYCOLAX/MIRALAX) powder Take 17 g by mouth daily. 500 g 0  . pregabalin (LYRICA) 50 MG capsule Take 3 capsules (150 mg total) by mouth 3 (three) times daily. 270 capsule 2  . Probiotic Product (PROBIOTIC PO) Take 1 capsule by mouth daily after breakfast.      No current facility-administered medications on file prior to visit.    Past Medical History  Diagnosis Date  . Diabetes mellitus, type 2 ([place])   . GERD (gastroesophageal reflux disease)   . Depression    . Seizure disorder ([place])   . Dyslipidemia   . OSA on CPAP   . Osteoarthritis of shoulder region     and Knee  . Anxiety   . Diverticulosis of colon 03/2010 hosp  . Hypertension   . Cerebrovascular disease, unspecified 03/13/2013    Atrophy and small vessel dz noted, MR brain 2009  . Allergic rhinitis, cause unspecified 03/13/2013  . Neuropathy [facility])     Past Surgical History  Procedure Laterality Date  . Cholecystectomy    . Abdominal hysterectomy  1970's    Partial  . Appendectomy    . Tonsillectomy and adenoidectomy    . Shoulder surgery  2008    LT, post fall     Social History   Social History  . Marital Status: Married    Spouse Name: N/A  . Number of Children: N/A  . Years of Education: N/A   Social History Main Topics  . Smoking status: Former Smoker    Quit date: 10/19/1985  . Smokeless tobacco: Never Used  . Alcohol Use: 0.0 oz/week    0 Standard drinks or equivalent per week  . Drug Use: No  . Sexual Activity: Not on file   Other Topics Concern  . Not on file   Social History Narrative   Patient lives in a one story home with her husband.  Has 2 children.  Retired from [employer].    Family History  Problem Relation Age of Onset  . Arthritis Mother   . Heart disease Father   . Arthritis Other     Grandmother  . Diabetes Other     Grandmother    Review of Systems  Constitutional: Positive for fatigue. Negative for fever and chills.  Respiratory: Positive for cough (related to allergies) and wheezing. Negative for shortness of breath.   Cardiovascular: Positive for chest pain (one episode related to GERD) and palpitations (one episode). Negative for leg swelling.  Gastrointestinal: Positive for abdominal distention (bloating).       Gerd  Neurological: Positive for headaches. Negative for dizziness and light-headedness.  Psychiatric/Behavioral: Positive for sleep disturbance. The patient is nervous/anxious.        Objective:   Filed Vitals:     09/14/15 1444  BP: 120/74  Pulse: 74  Temp: 98.3 F (36.8 C)  Resp: 16   Filed Weights   09/14/15 1444  Weight: 180 lb (81.647 kg)   Body mass index is 33.46 kg/(m^2).   Physical Exam Constitutional: Appears well-developed and well-nourished. No distress.  Neck: Neck supple. No tracheal deviation present. No thyromegaly present.  No carotid bruit. No cervical adenopathy.   Cardiovascular: Normal rate, regular rhythm and normal heart sounds.   No murmur heard.  No edema Pulmonary/Chest: Effort normal and breath sounds normal. No respiratory distress. No wheezes.  Psych: mood slightly depressed        Assessment & Plan:   Her insurance company will no longer cover the meclizine. Advised that she purchase antivert over-the-counter.  See Problem List for Assessment and Plan of chronic medical problems.  Follow-up in 6-8weeks

## 2015-09-14 NOTE — Assessment & Plan Note (Signed)
Check A1c Sugars have been controlled Continue current medication

## 2015-09-14 NOTE — Assessment & Plan Note (Signed)
Has not been sleeping likely related to increased stress and anxiety Trial of trazodone 50 mg daily Follow-up in approximately 6 weeks

## 2015-09-14 NOTE — Assessment & Plan Note (Signed)
Increased symptoms recently - likely related to increased stress Will work on anxiety/stress/depression control - if no improvement may need to see GI

## 2015-09-14 NOTE — Assessment & Plan Note (Signed)
Not controlled Zantac 150 milligrams twice daily-prescription sent to pharmacy Is not controlled she will need a PPI

## 2015-09-14 NOTE — Assessment & Plan Note (Signed)
BP well controlled Current regimen effective and well tolerated Continue current medications at current doses  

## 2015-09-15 ENCOUNTER — Encounter: Payer: Self-pay | Admitting: Emergency Medicine

## 2015-09-15 ENCOUNTER — Other Ambulatory Visit: Payer: Self-pay | Admitting: Internal Medicine

## 2015-09-15 MED ORDER — POTASSIUM CHLORIDE CRYS ER 20 MEQ PO TBCR: 20.0000 meq | EXTENDED_RELEASE_TABLET | Freq: Every day | ORAL | Status: DC

## 2015-09-16 ENCOUNTER — Telehealth: Payer: Self-pay

## 2015-09-16 NOTE — Telephone Encounter (Signed)
PA initiated via [facility]

## 2015-09-17 NOTE — Telephone Encounter (Signed)
Received fax from [employer]. PA for Paxil approved through 06/26/2016

## 2015-09-21 ENCOUNTER — Other Ambulatory Visit: Payer: Self-pay | Admitting: Internal Medicine

## 2015-09-21 DIAGNOSIS — R928 Other abnormal and inconclusive findings on diagnostic imaging of breast: Secondary | ICD-10-CM

## 2015-09-22 NOTE — Telephone Encounter (Signed)
Last OV 09/14/15. Please advise.

## 2015-10-06 ENCOUNTER — Other Ambulatory Visit: Payer: Self-pay

## 2015-10-06 ENCOUNTER — Other Ambulatory Visit: Payer: Self-pay | Admitting: Internal Medicine

## 2015-10-06 DIAGNOSIS — R928 Other abnormal and inconclusive findings on diagnostic imaging of breast: Secondary | ICD-10-CM

## 2015-10-07 ENCOUNTER — Ambulatory Visit
Admission: RE | Admit: 2015-10-07 | Discharge: 2015-10-07 | Disposition: A | Discharge status: Home / Self Care | Payer: Medicare Other | Source: Ambulatory Visit | Attending: Internal Medicine | Admitting: Internal Medicine

## 2015-10-07 DIAGNOSIS — R928 Other abnormal and inconclusive findings on diagnostic imaging of breast: Secondary | ICD-10-CM

## 2015-10-25 ENCOUNTER — Other Ambulatory Visit: Payer: Self-pay | Admitting: Family

## 2015-11-27 ENCOUNTER — Other Ambulatory Visit: Payer: Self-pay | Admitting: Internal Medicine

## 2015-12-10 ENCOUNTER — Other Ambulatory Visit: Payer: Self-pay | Admitting: Neurology

## 2015-12-10 NOTE — Telephone Encounter (Signed)
Rx sent 

## 2015-12-30 ENCOUNTER — Encounter (HOSPITAL_COMMUNITY): Payer: Self-pay | Admitting: Emergency Medicine

## 2015-12-30 ENCOUNTER — Emergency Department (HOSPITAL_COMMUNITY)
Admission: EM | Admit: 2015-12-30 | Discharge: 2015-12-31 | Disposition: A | Discharge status: Home / Self Care | Payer: Medicare Other | Attending: Emergency Medicine | Admitting: Emergency Medicine

## 2015-12-30 ENCOUNTER — Emergency Department (HOSPITAL_COMMUNITY): Payer: Medicare Other

## 2015-12-30 DIAGNOSIS — Y92009 Unspecified place in unspecified non-institutional (private) residence as the place of occurrence of the external cause: Secondary | ICD-10-CM | POA: Insufficient documentation

## 2015-12-30 DIAGNOSIS — Z7982 Long term (current) use of aspirin: Secondary | ICD-10-CM | POA: Insufficient documentation

## 2015-12-30 DIAGNOSIS — W010XXA Fall on same level from slipping, tripping and stumbling without subsequent striking against object, initial encounter: Secondary | ICD-10-CM | POA: Diagnosis not present

## 2015-12-30 DIAGNOSIS — M545 Low back pain, unspecified: Secondary | ICD-10-CM

## 2015-12-30 DIAGNOSIS — E1140 Type 2 diabetes mellitus with diabetic neuropathy, unspecified: Secondary | ICD-10-CM | POA: Diagnosis not present

## 2015-12-30 DIAGNOSIS — I10 Essential (primary) hypertension: Secondary | ICD-10-CM | POA: Diagnosis not present

## 2015-12-30 DIAGNOSIS — Y999 Unspecified external cause status: Secondary | ICD-10-CM | POA: Diagnosis not present

## 2015-12-30 DIAGNOSIS — Z7984 Long term (current) use of oral hypoglycemic drugs: Secondary | ICD-10-CM | POA: Diagnosis not present

## 2015-12-30 DIAGNOSIS — Y939 Activity, unspecified: Secondary | ICD-10-CM | POA: Diagnosis not present

## 2015-12-30 DIAGNOSIS — Z87891 Personal history of nicotine dependence: Secondary | ICD-10-CM | POA: Insufficient documentation

## 2015-12-30 DIAGNOSIS — M25552 Pain in left hip: Secondary | ICD-10-CM | POA: Diagnosis not present

## 2015-12-30 DIAGNOSIS — M25562 Pain in left knee: Secondary | ICD-10-CM

## 2015-12-30 DIAGNOSIS — W19XXXA Unspecified fall, initial encounter: Secondary | ICD-10-CM

## 2015-12-30 LAB — URINALYSIS, ROUTINE W REFLEX MICROSCOPIC
Bilirubin Urine: NEGATIVE
GLUCOSE, UA: NEGATIVE mg/dL
Hgb urine dipstick: NEGATIVE
Ketones, ur: NEGATIVE mg/dL
LEUKOCYTES UA: NEGATIVE
Nitrite: NEGATIVE
PROTEIN: NEGATIVE mg/dL
Specific Gravity, Urine: 1.011 (ref 1.005–1.030)
pH: 5.5 (ref 5.0–8.0)

## 2015-12-30 LAB — CBC WITH DIFFERENTIAL/PLATELET
BASOS PCT: 0 %
Basophils Absolute: 0 10*3/uL (ref 0.0–0.1)
EOS ABS: 0.2 10*3/uL (ref 0.0–0.7)
EOS PCT: 2 %
HEMATOCRIT: 39.8 % (ref 36.0–46.0)
Hemoglobin: 13.1 g/dL (ref 12.0–15.0)
Lymphocytes Relative: 47 %
Lymphs Abs: 4.2 10*3/uL — ABNORMAL HIGH (ref 0.7–4.0)
MCH: 30.1 pg (ref 26.0–34.0)
MCHC: 32.9 g/dL (ref 30.0–36.0)
MCV: 91.5 fL (ref 78.0–100.0)
MONO ABS: 0.6 10*3/uL (ref 0.1–1.0)
MONOS PCT: 6 %
Neutro Abs: 4 10*3/uL (ref 1.7–7.7)
Neutrophils Relative %: 45 %
Platelets: 292 10*3/uL (ref 150–400)
RBC: 4.35 MIL/uL (ref 3.87–5.11)
RDW: 13.3 % (ref 11.5–15.5)
WBC: 9 10*3/uL (ref 4.0–10.5)

## 2015-12-30 LAB — COMPREHENSIVE METABOLIC PANEL
ALBUMIN: 3.8 g/dL (ref 3.5–5.0)
ALT: 21 U/L (ref 14–54)
ANION GAP: 9 (ref 5–15)
AST: 27 U/L (ref 15–41)
Alkaline Phosphatase: 65 U/L (ref 38–126)
BILIRUBIN TOTAL: 0.3 mg/dL (ref 0.3–1.2)
BUN: 11 mg/dL (ref 6–20)
CO2: 27 mmol/L (ref 22–32)
Calcium: 10.4 mg/dL — ABNORMAL HIGH (ref 8.9–10.3)
Chloride: 99 mmol/L — ABNORMAL LOW (ref 101–111)
Creatinine, Ser: 0.7 mg/dL (ref 0.44–1.00)
GFR calc Af Amer: >60 mL/min (ref >60)
GFR calc non Af Amer: >60 mL/min (ref >60)
GLUCOSE: 86 mg/dL (ref 65–99)
POTASSIUM: 3.5 mmol/L (ref 3.5–5.1)
SODIUM: 135 mmol/L (ref 135–145)
TOTAL PROTEIN: 7.5 g/dL (ref 6.5–8.1)

## 2015-12-30 MED ORDER — HYDROCODONE-ACETAMINOPHEN 5-325 MG PO TABS
1.0000 | ORAL_TABLET | Freq: Once | ORAL | Status: AC
  Administered 2015-12-30: 1 via ORAL
  Filled 2015-12-30: qty 1

## 2015-12-30 NOTE — ED Notes (Signed)
Pt. lost her balance and fell while changing a light bulb at home 3 months ago , reports pain at left knee and lower back onset last week , denies hematuria or dysuria . Respirations unlabored / ambulatory .

## 2015-12-30 NOTE — ED Provider Notes (Signed)
CSN: [id number]     Arrival date & time 12/30/15  2141 History   First MD Initiated Contact with Patient 12/30/15 2231     Chief Complaint  Patient presents with  . Back Pain  . Knee Pain      HPI Pt was seen at 2230. Per pt, c/o sudden onset and resolution of one episode of fall that occurred 3 months ago. Pt states she was on as stepstool changing light bulb, slipped and fell, landing on the floor on her left side. Pt c/o LBP, left hip and acute flair of her chronic left knee pain since the fall. Pt has been ambulatory since the fall. Pt states her left knee pain is chronic and "I'm putting off getting it done" (knee replacement). Pt denies hitting head, no AMS, no neck pain, no CP/SOB, no abd pain, no N/V/D, no focal motor weakness, no tingling/numbness in extremities.    Ortho: Dr. Tonita Cong Past Medical History  Diagnosis Date  . Diabetes mellitus, type 2 ([place])   . GERD (gastroesophageal reflux disease)   . Depression   . Seizure disorder ([place])   . Dyslipidemia   . OSA on CPAP   . Osteoarthritis of shoulder region     and Knee  . Anxiety   . Diverticulosis of colon 03/2010 hosp  . Hypertension   . Cerebrovascular disease, unspecified 03/13/2013    Atrophy and small vessel dz noted, MR brain 2009  . Allergic rhinitis, cause unspecified 03/13/2013  . Neuropathy [facility])    Past Surgical History  Procedure Laterality Date  . Cholecystectomy    . Abdominal hysterectomy  1970's    Partial  . Appendectomy    . Tonsillectomy and adenoidectomy    . Shoulder surgery  2008    LT, post fall    Family History  Problem Relation Age of Onset  . Arthritis Mother   . Heart disease Father   . Arthritis Other     Grandmother  . Diabetes Other     Grandmother   Social History  Substance Use Topics  . Smoking status: Former Smoker    Quit date: 10/19/1985  . Smokeless tobacco: Never Used  . Alcohol Use: 0.0 oz/week    0 Standard drinks or equivalent per week    Review of  Systems ROS: Statement: All systems negative except as marked or noted in the HPI; Constitutional: Negative for fever and chills. ; ; Eyes: Negative for eye pain, redness and discharge. ; ; ENMT: Negative for ear pain, hoarseness, nasal congestion, sinus pressure and sore throat. ; ; Cardiovascular: Negative for chest pain, palpitations, diaphoresis, dyspnea and peripheral edema. ; ; Respiratory: Negative for cough, wheezing and stridor. ; ; Gastrointestinal: Negative for nausea, vomiting, diarrhea, abdominal pain, blood in stool, hematemesis, jaundice and rectal bleeding. . ; ; Genitourinary: Negative for dysuria, flank pain and hematuria. ; ; Musculoskeletal: +LBP, left hip pain, chronic left knee pain. Negative for neck pain. Negative for swelling and deformity.; ; Skin: Negative for pruritus, rash, abrasions, blisters, bruising and skin lesion.; ; Neuro: Negative for headache, lightheadedness and neck stiffness. Negative for weakness, altered level of consciousness, altered mental status, extremity weakness, paresthesias, involuntary movement, seizure and syncope.     Allergies  Other; Statins; and Penicillins  Home Medications   Prior to Admission medications   Medication Sig Start Date End Date Taking? Authorizing Provider  albuterol (PROAIR HFA) 108 (90 BASE) MCG/ACT inhaler Inhale 2 puffs into the lungs every 6 (six) hours  as needed for wheezing or shortness of breath.     Historical Provider, MD  Alpha-D-Galactosidase (BEANO PO) Take 1-2 tablets by mouth daily as needed (for gas).    Historical Provider, MD  ALPRAZolam Duanne Moron) 0.25 MG tablet Take 0.25 mg by mouth 2 (two) times daily as needed for anxiety.  08/11/11   Rowe Clack, MD  aspirin 81 MG tablet Take 81 mg by mouth daily.      Historical Provider, MD  azelastine (ASTELIN) 0.1 % nasal spray Place 2 sprays into both nostrils 2 (two) times daily as needed for rhinitis. Use in each nostril as directed    Historical Provider, MD   Biotin 5000 MCG TABS Take 5,000 mcg by mouth daily. Patient taking differently: Take 1,000 mcg by mouth daily.  11/29/12   Rowe Clack, MD  Black Cohosh 80 MG CAPS Take 80 capsules by mouth daily as needed (for vitamin).    Historical Provider, MD  Blood Glucose Monitoring Suppl (ACCU-CHEK AVIVA PLUS) W/DEVICE KIT 1 kit by Does not apply route once. 11/13/12   Rowe Clack, MD  Cholecalciferol (EQL VITAMIN D3) 1000 UNITS tablet Take 1,000 Units by mouth daily as needed (for vitamin).     Historical Provider, MD  Cyanocobalamin (VITAMIN B 12) 100 MCG LOZG Take 100 mcg by mouth daily as needed (for vitamin).     Historical Provider, MD  cyclobenzaprine (FLEXERIL) 5 MG tablet TAKE 1 TABLET (5 MG TOTAL) BY MOUTH 3 (THREE) TIMES DAILY AS NEEDED FOR MUSCLE SPASMS. 05/19/15   Binnie Rail, MD  gabapentin (NEURONTIN) 800 MG tablet Take 800 mg by mouth 3 (three) times daily. 06/29/15   Historical Provider, MD  gabapentin (NEURONTIN) 800 MG tablet Take 1 tablet by mouth 3  times daily 09/22/15   Binnie Rail, MD  glipiZIDE (GLUCOTROL XL) 5 MG 24 hr tablet TAKE 1 TABLET (5 MG TOTAL) BY MOUTH DAILY. 02/23/15   Rowe Clack, MD  glucose blood (ACCU-CHEK AVIVA PLUS) test strip Use to check blood sugars twice a day Dx E11.9 11/27/15   Binnie Rail, MD  hydrochlorothiazide (MICROZIDE) 12.5 MG capsule TAKE 1 CAPSULE (12.5 MG TOTAL) BY MOUTH DAILY AS NEEDED FOR EDEMA 10/26/15   Binnie Rail, MD  lidocaine (XYLOCAINE) 5 % ointment Apply 1 application topically 2 (two) times daily as needed. Use gloves when applying to feet 07/28/14   Donika K Patel, DO  losartan (COZAAR) 50 MG tablet Take 1 tablet (50 mg total) by mouth daily. 08/13/14   Rowe Clack, MD  meclizine (ANTIVERT) 12.5 MG tablet Take 1 tablet (12.5 mg total) by mouth 3 (three) times daily as needed for dizziness. 09/14/15   Binnie Rail, MD  metFORMIN (GLUCOPHAGE-XR) 500 MG 24 hr tablet TAKE 1 TABLET (500 MG TOTAL) BY MOUTH DAILY WITH  BREAKFAST. 09/03/15   Binnie Rail, MD  Multiple Vitamin (MULTIVITAMIN) capsule Take 1 capsule by mouth daily as needed (for vitamin).     Historical Provider, MD  OXcarbazepine (TRILEPTAL) 150 MG tablet TAKE 1.5 TABLETS (225 MG TOTAL) BY MOUTH 2 (TWO) TIMES DAILY. 12/10/15   Donika K Patel, DO  PARoxetine (PAXIL) 10 MG tablet Take 1 tablet (10 mg total) by mouth daily. 09/14/15   Binnie Rail, MD  polyethylene glycol powder (GLYCOLAX/MIRALAX) powder Take 17 g by mouth daily. 06/11/14   Rowe Clack, MD  potassium chloride SA (K-DUR,KLOR-CON) 20 MEQ tablet Take 1 tablet (20 mEq total) by mouth  daily. 09/15/15   Binnie Rail, MD  Probiotic Product (PROBIOTIC PO) Take 1 capsule by mouth daily after breakfast.     Historical Provider, MD  ranitidine (ZANTAC) 150 MG tablet Take 1 tablet (150 mg total) by mouth 2 (two) times daily. 09/14/15   Binnie Rail, MD  traZODone (DESYREL) 50 MG tablet Take 1 tablet (50 mg total) by mouth at bedtime. 09/14/15   Binnie Rail, MD   BP 129/83 mmHg  Pulse 79  Temp(Src) 97.7 F (36.5 C) (Oral)  Resp 16  SpO2 99% Physical Exam  2235: Physical examination:  Nursing notes reviewed; Vital signs and O2 SAT reviewed;  Constitutional: Well developed, Well nourished, Well hydrated, In no acute distress; Head:  Normocephalic, atraumatic; Eyes: EOMI, PERRL, No scleral icterus; ENMT: Mouth and pharynx normal, Mucous membranes moist; Neck: Supple, Full range of motion, No lymphadenopathy; Cardiovascular: Regular rate and rhythm, No gallop; Respiratory: Breath sounds clear & equal bilaterally, No wheezes.  Speaking full sentences with ease, Normal respiratory effort/excursion; Chest: Nontender, Movement normal; Abdomen: Soft, Nontender, Nondistended, Normal bowel sounds; Genitourinary: No CVA tenderness; Spine:  No midline CS, TS, LS tenderness. +TTP left lumbar paraspinal muscles.;; Extremities: Pulses normal, Pelvis stable. +left hip tenderness to palp. No deformity. Muscles  compartments soft. NT left knee/ankle/foot. No edema. +FROM lef knee, including able to lift extended LLE off stretcher, and extend left lower leg against resistance.  No ligamentous laxity.  No patellar or quad tendon step-offs.  NMS intact left foot, strong pedal pp. +plantarflexion of left foot w/calf squeeze.  No palpable gap left Achilles's tendon.  No proximal fibular head tenderness.  No edema, erythema, warmth, ecchymosis or deformity. No calf tenderness, edema or asymmetry.; Neuro: AA&Ox3, Major CN grossly intact.  Speech clear. No gross focal motor or sensory deficits in extremities.; Skin: Color normal, Warm, Dry.   ED Course  Procedures (including critical care time) Labs Review  Imaging Review  I have personally reviewed and evaluated these images and lab results as part of my medical decision-making.   EKG Interpretation None      MDM  MDM Reviewed: previous chart, nursing note and vitals Reviewed previous: labs Interpretation: x-ray and labs     Results for orders placed or performed during the hospital encounter of 12/30/15  CBC with Differential  Result Value Ref Range   WBC 9.0 4.0 - 10.5 K/uL   RBC 4.35 3.87 - 5.11 MIL/uL   Hemoglobin 13.1 12.0 - 15.0 g/dL   HCT 39.8 36.0 - 46.0 %   MCV 91.5 78.0 - 100.0 fL   MCH 30.1 26.0 - 34.0 pg   MCHC 32.9 30.0 - 36.0 g/dL   RDW 13.3 11.5 - 15.5 %   Platelets 292 150 - 400 K/uL   Neutrophils Relative % 45 %   Neutro Abs 4.0 1.7 - 7.7 K/uL   Lymphocytes Relative 47 %   Lymphs Abs 4.2 (H) 0.7 - 4.0 K/uL   Monocytes Relative 6 %   Monocytes Absolute 0.6 0.1 - 1.0 K/uL   Eosinophils Relative 2 %   Eosinophils Absolute 0.2 0.0 - 0.7 K/uL   Basophils Relative 0 %   Basophils Absolute 0.0 0.0 - 0.1 K/uL  Comprehensive metabolic panel  Result Value Ref Range   Sodium 135 135 - 145 mmol/L   Potassium 3.5 3.5 - 5.1 mmol/L   Chloride 99 (L) 101 - 111 mmol/L   CO2 27 22 - 32 mmol/L   Glucose, Bld 86 65 -  99 mg/dL   BUN  11 6 - 20 mg/dL   Creatinine, Ser 0.70 0.44 - 1.00 mg/dL   Calcium 10.4 (H) 8.9 - 10.3 mg/dL   Total Protein 7.5 6.5 - 8.1 g/dL   Albumin 3.8 3.5 - 5.0 g/dL   AST 27 15 - 41 U/L   ALT 21 14 - 54 U/L   Alkaline Phosphatase 65 38 - 126 U/L   Total Bilirubin 0.3 0.3 - 1.2 mg/dL   GFR calc non Af Amer >60 >60 mL/min   GFR calc Af Amer >60 >60 mL/min   Anion gap 9 5 - 15  Urinalysis, Routine w reflex microscopic (not at [facility])  Result Value Ref Range   Color, Urine YELLOW YELLOW   APPearance CLEAR CLEAR   Specific Gravity, Urine 1.011 1.005 - 1.030   pH 5.5 5.0 - 8.0   Glucose, UA NEGATIVE NEGATIVE mg/dL   Hgb urine dipstick NEGATIVE NEGATIVE   Bilirubin Urine NEGATIVE NEGATIVE   Ketones, ur NEGATIVE NEGATIVE mg/dL   Protein, ur NEGATIVE NEGATIVE mg/dL   Nitrite NEGATIVE NEGATIVE   Leukocytes, UA NEGATIVE NEGATIVE   Dg Lumbar Spine Complete 12/30/2015  CLINICAL DATA:  [place] 3 months ago. Pain in the left lateral hip and low back. EXAM: LUMBAR SPINE - COMPLETE 4+ VIEW COMPARISON:  CT 05/12/2014 FINDINGS: Five lumbar type vertebral bodies show normal alignment. There is disc space narrowing throughout the region from T12-L1 through L4-5. L5-S1 disc height is maintained. There is lower lumbar facet degeneration. No fracture or focal lesion. Aortic atherosclerosis. IMPRESSION: No traumatic finding. Chronic degenerative changes throughout the region with disc space narrowing from T12-L1 through L4-5. Lower lumbar facet arthropathy. Findings could certainly relate to low back pain. Aortic atherosclerosis. Electronically Signed   By: Nelson Chimes M.D.   On: 12/30/2015 23:40   Dg Knee Complete 4 Views Left 12/30/2015  CLINICAL DATA:  [place] 3 months ago.  Persistent knee pain. EXAM: LEFT KNEE - COMPLETE 4+ VIEW COMPARISON:  07/07/2006 FINDINGS: Moderate size joint effusion. Mild medial compartment joint space narrowing with marginal osteophytes. Possible avascular necrosis of the articular surface of the  medial femoral condyle . No other focal finding. IMPRESSION: Moderate-sized joint effusion. Medial compartment osteoarthritis with joint space narrowing and marginal osteophytes. Suspicion of avascular necrosis affecting the articular surface of the medial femoral condyle. Electronically Signed   By: Nelson Chimes M.D.   On: 12/30/2015 23:42   Dg Hip Unilat With Pelvis 2-3 Views Left 12/30/2015  CLINICAL DATA:  [place] 3 months ago.  Left lateral hip pain. EXAM: DG HIP (WITH OR WITHOUT PELVIS) 2-3V LEFT COMPARISON:  None. FINDINGS: Bones of the pelvis are normal. No pelvic fracture. Both hip joints appear normal without degenerative change or avascular necrosis. No traumatic finding of either hip. Calcification adjacent to the greater trochanter on the left could relate to old trauma or bursitis. No other focal finding. IMPRESSION: No recent traumatic finding. No hip fracture. No hip degenerative change. Calcification adjacent to the greater trochanter on the left could the an old post traumatic finding or relate to bursitis. Electronically Signed   By: Nelson Chimes M.D.   On: 12/30/2015 23:44    2355:  Will place in knee immobilizer, give crutches to walk. Pt states she does have a walker at home she can use to assist walking also. Pt feels better after meds and wants to go home now. Pt has Ortho MD to f/u with. Dx and testing d/w pt and  family.  Questions answered.  Verb understanding, agreeable to d/c home with outpt f/u.     Francine Graven, DO 12/31/15 1521

## 2015-12-31 MED ORDER — HYDROCODONE-ACETAMINOPHEN 5-325 MG PO TABS: ORAL_TABLET | ORAL | Status: DC

## 2015-12-31 MED ORDER — NAPROXEN 250 MG PO TABS: 250.0000 mg | ORAL_TABLET | Freq: Two times a day (BID) | ORAL | Status: DC | PRN

## 2015-12-31 NOTE — Discharge Instructions (Signed)
Take the prescriptions as directed.  Apply moist heat or ice to the area(s) of discomfort, for 15 minutes at a time, several times per day for the next few days.  Do not fall asleep on a heating or ice pack.  Wear the knee immobilizer and use either the crutches or your walker to assist with you walking until you are seen in follow up. Call your regular Orthopedic doctor tomorrow to schedule a follow up appointment this week. Call your regular medical doctor to schedule a follow up appointment within the next week. Return to the Emergency Department immediately if worsening.

## 2016-02-03 ENCOUNTER — Encounter: Payer: Self-pay | Admitting: Internal Medicine

## 2016-02-03 ENCOUNTER — Ambulatory Visit (INDEPENDENT_AMBULATORY_CARE_PROVIDER_SITE_OTHER): Payer: Medicare Other | Admitting: Internal Medicine

## 2016-02-03 DIAGNOSIS — K588 Other irritable bowel syndrome: Secondary | ICD-10-CM | POA: Diagnosis not present

## 2016-02-03 DIAGNOSIS — M150 Primary generalized (osteo)arthritis: Secondary | ICD-10-CM

## 2016-02-03 DIAGNOSIS — M159 Polyosteoarthritis, unspecified: Secondary | ICD-10-CM

## 2016-02-03 NOTE — Progress Notes (Signed)
   Subjective:    Patient ID: [name], female    DOB: February 22, 1941, 75 y.o.   MRN: [id number]  HPI The patient is a 75 YO female coming in for ER visit (seen in July for fall about 3 months prior to that per documentation although she states fall was late June). She had x-ray of the hip and knee and placed in immobilizer and advised to see orthopedics. She is now out of hydrocodone and naproxen from the ER. Saw Dr. Tonita Cong her orthopedic doctor and he gave her two medicine for pain and she thinks one is tramadol and one that starts with an m (she is not sure)(per [place] narcotic database review during visit got 60 tramadol on 01/29/16). They also gave her an injection of cortisone at her recent visit with them. Denies new concerns. Some mild constipation since starting these pain medicine. She has a brace for her knee which she uses sometimes.   PMH, [facility], social history reviewed and updated (medication reconciliation unable to be completed 100% due to patient not bringing her meds and not knowing the names of her new meds from orthopedics).   Review of Systems  Constitutional: Negative for activity change, appetite change, fatigue and fever.  HENT: Negative.   Eyes: Negative.   Respiratory: Negative for cough, chest tightness and shortness of breath.   Cardiovascular: Negative for chest pain, palpitations and leg swelling.  Gastrointestinal: Positive for constipation. Negative for abdominal distention, abdominal pain, diarrhea and nausea.  Musculoskeletal: Positive for arthralgias, back pain and gait problem. Negative for myalgias, neck pain and neck stiffness.  Skin: Negative.   Psychiatric/Behavioral: Negative.       Objective:   Physical Exam  Constitutional: She is oriented to person, place, and time. She appears well-developed and well-nourished.  HENT:  Head: Normocephalic and atraumatic.  Eyes: EOM are normal.  Neck: Normal range of motion.  Cardiovascular: Normal rate and regular  rhythm.   Pulmonary/Chest: Effort normal. No respiratory distress. She has no wheezes. She has no rales.  Abdominal: Soft. She exhibits no distension. There is no tenderness. There is no rebound.  Musculoskeletal:  Pain in the low back, left hip, left knee  Neurological: She is alert and oriented to person, place, and time.  Skin: Skin is warm and dry.  Psychiatric: She has a normal mood and affect.   Vitals:   02/03/16 0907  BP: 132/74  Pulse: 68  Resp: 14  Temp: 97.8 F (36.6 C)  TempSrc: Oral  SpO2: 98%  Weight: 182 lb (82.6 kg)  Height: 5' 1.5" (1.562 m)      Assessment & Plan:

## 2016-02-03 NOTE — Patient Instructions (Signed)
Continue to see Dr. Tonita Cong about the left knee and hip pain. Continue to take the medicines he is giving you for it.   The medicine for the bowels is called senokot-d and is two medicines. One helps to move the bowels better and the other is the stool softener. Take 1 pill daily (if this does not help increase to 1 pill twice a day).   You can stop taking the stool softener you currently take as it is in this medicine. You can still use the miralax as needed if you are not going well.

## 2016-02-03 NOTE — Progress Notes (Signed)
Pre visit review using our clinic review tool, if applicable. No additional management support is needed unless otherwise documented below in the visit note. 

## 2016-02-03 NOTE — Assessment & Plan Note (Signed)
She is encouraged to continue seeing her orthopedic specialist for treatment of her OA. She is reminded that her weight is a contributing factor to some of her pain and working on weight loss ongoing is important.

## 2016-02-03 NOTE — Assessment & Plan Note (Signed)
Talked to her about the need to use some agent for her constipation while on pain medication. Advised she try senokot-d daily (then BID if not effective) as she does not want to take any liquids or powders for constipation at this time. She can stop taking her otc stool softener as this is in senokot-d and can continue to use miralax prn for moderate to severe constipation. No evidence for obstruction today.

## 2016-02-15 ENCOUNTER — Other Ambulatory Visit: Payer: Self-pay | Admitting: Internal Medicine

## 2016-02-24 ENCOUNTER — Other Ambulatory Visit: Payer: Self-pay | Admitting: Internal Medicine

## 2016-03-10 ENCOUNTER — Other Ambulatory Visit: Payer: Self-pay | Admitting: Internal Medicine

## 2016-04-02 ENCOUNTER — Other Ambulatory Visit: Payer: Self-pay | Admitting: Internal Medicine

## 2016-04-21 ENCOUNTER — Other Ambulatory Visit: Payer: Self-pay | Admitting: Internal Medicine

## 2016-05-23 ENCOUNTER — Telehealth: Payer: Self-pay | Admitting: Internal Medicine

## 2016-05-23 NOTE — Telephone Encounter (Signed)
Patient Name: [name] DOB: [birth year] Initial Comment Caller States- need to make an appointment, having chest pressure and back pain Nurse Assessment Nurse: Andria Frames, RN, Aeriel Date/Time (Eastern Time): 05/23/2016 4:20:16 PM Confirm and document reason for call. If symptomatic, describe symptoms. You must click the next button to save text entered. ---Caller states, a few days ago she started with some heaviness in the chest. Her organs and things something is going on the lower back is hurting often. Caller denies heaviness and chest pain right now. She also had it a little yesterday. Caller states, she is having a little back pain right now. She also gets the urge to have a bm she gets very little out but it still feels like she needs to go. It just doesn't feel like a bm should. Caller denies any fever. She has been under a little stress and grief. If she starts to do something her energy level is low and she gets SOB. That is new in the last 6 months. She is also on Gabapentin she is having a lot of neuropathy and it is not helping. Her feet burn terribly. If she doesn't take her medication. She is going to feel it in her arms and into her face. Does the patient have any new or worsening symptoms? ---Yes Will a triage be completed? ---Yes Related visit to physician within the last 2 weeks? ---No Does the PT have any chronic conditions? (i.e. diabetes, asthma, etc.) ---Yes List chronic conditions. ---diabetic, Is this a behavioral health or substance abuse call? ---No Guidelines Guideline Title Affirmed Question Affirmed Notes Chest Pain Chest pain lasts > 5 minutes (Exceptions: chest pain occurring > 3 days ago and now asymptomatic; same as previously diagnosed heartburn and has accompanying sour taste in mouth) Neurologic Deficit Headache (and neurologic deficit) Final Disposition User Go to ED Now Hensel, RN, Aeriel Comments She also left knee problems. Caller did  not refuse. Caller states, she will call her son and tell him that him that she needs to go to the emergency room. Caller states, before she goes to the ED she needs to contact her son. She did not refuse the outcome. PLEASE NOTE: All timestamps contained within this report are represented as [place] Standard Time. CONFIDENTIALTY NOTICE: This fax transmission is intended only for the addressee. It contains information that is legally privileged, confidential or otherwise protected from use or disclosure. If you are not the intended recipient, you are strictly prohibited from reviewing, disclosing, copying using or disseminating any of this information or taking any action in reliance on or regarding this information. If you have received this fax in error, please notify us immediately by telephone so that we can arrange for its return to [place]. Phone: [phone], Toll-Free: [phone], Fax: [phone] Page: 2 of 2 Call Id: [id number] Referrals [facility] - ED Disagree/Comply: Comply Disagree/Comply: Comply

## 2016-05-24 NOTE — Telephone Encounter (Signed)
Noted. Agree she should be evaluated in ED and agreed to go.

## 2016-05-30 ENCOUNTER — Other Ambulatory Visit: Payer: Self-pay | Admitting: Internal Medicine

## 2016-06-17 ENCOUNTER — Other Ambulatory Visit: Payer: Self-pay | Admitting: Internal Medicine

## 2016-06-17 NOTE — Telephone Encounter (Signed)
Please advise if okay to fill, not on current med list.

## 2016-07-26 ENCOUNTER — Other Ambulatory Visit (INDEPENDENT_AMBULATORY_CARE_PROVIDER_SITE_OTHER): Payer: Medicare Other

## 2016-07-26 ENCOUNTER — Encounter: Payer: Self-pay | Admitting: Internal Medicine

## 2016-07-26 ENCOUNTER — Ambulatory Visit (INDEPENDENT_AMBULATORY_CARE_PROVIDER_SITE_OTHER): Payer: Medicare Other | Admitting: Internal Medicine

## 2016-07-26 VITALS — BP 126/76 | HR 74 | Temp 98.4°F | Resp 16 | Ht 62.0 in | Wt 180.0 lb

## 2016-07-26 DIAGNOSIS — E1142 Type 2 diabetes mellitus with diabetic polyneuropathy: Secondary | ICD-10-CM

## 2016-07-26 DIAGNOSIS — I10 Essential (primary) hypertension: Secondary | ICD-10-CM | POA: Diagnosis not present

## 2016-07-26 DIAGNOSIS — E119 Type 2 diabetes mellitus without complications: Secondary | ICD-10-CM | POA: Insufficient documentation

## 2016-07-26 DIAGNOSIS — F329 Major depressive disorder, single episode, unspecified: Secondary | ICD-10-CM

## 2016-07-26 DIAGNOSIS — K219 Gastro-esophageal reflux disease without esophagitis: Secondary | ICD-10-CM

## 2016-07-26 DIAGNOSIS — E7800 Pure hypercholesterolemia, unspecified: Secondary | ICD-10-CM | POA: Diagnosis not present

## 2016-07-26 DIAGNOSIS — G40909 Epilepsy, unspecified, not intractable, without status epilepticus: Secondary | ICD-10-CM

## 2016-07-26 DIAGNOSIS — E1140 Type 2 diabetes mellitus with diabetic neuropathy, unspecified: Secondary | ICD-10-CM

## 2016-07-26 DIAGNOSIS — F32A Depression, unspecified: Secondary | ICD-10-CM

## 2016-07-26 DIAGNOSIS — K588 Other irritable bowel syndrome: Secondary | ICD-10-CM

## 2016-07-26 DIAGNOSIS — R300 Dysuria: Secondary | ICD-10-CM | POA: Diagnosis not present

## 2016-07-26 DIAGNOSIS — F419 Anxiety disorder, unspecified: Secondary | ICD-10-CM

## 2016-07-26 LAB — COMPREHENSIVE METABOLIC PANEL
ALT: 19 U/L (ref 0–35)
AST: 24 U/L (ref 0–37)
Albumin: 4.5 g/dL (ref 3.5–5.2)
Alkaline Phosphatase: 68 U/L (ref 39–117)
BUN: 11 mg/dL (ref 6–23)
CO2: 36 mEq/L — ABNORMAL HIGH (ref 19–32)
Calcium: 10.7 mg/dL — ABNORMAL HIGH (ref 8.4–10.5)
Chloride: 95 mEq/L — ABNORMAL LOW (ref 96–112)
Creatinine, Ser: 0.78 mg/dL (ref 0.40–1.20)
GFR: 92.95 mL/min (ref >60.00)
Glucose, Bld: 91 mg/dL (ref 70–99)
Potassium: 3.4 mEq/L — ABNORMAL LOW (ref 3.5–5.1)
Sodium: 136 mEq/L (ref 135–145)
Total Bilirubin: 0.3 mg/dL (ref 0.2–1.2)
Total Protein: 8.1 g/dL (ref 6.0–8.3)

## 2016-07-26 LAB — URINALYSIS, ROUTINE W REFLEX MICROSCOPIC
Bilirubin Urine: NEGATIVE
Hgb urine dipstick: NEGATIVE
Ketones, ur: NEGATIVE
Leukocytes, UA: NEGATIVE
Nitrite: NEGATIVE
Specific Gravity, Urine: 1.01 (ref 1.000–1.030)
Total Protein, Urine: NEGATIVE
Urine Glucose: NEGATIVE
Urobilinogen, UA: 0.2 (ref 0.0–1.0)
pH: 6 (ref 5.0–8.0)

## 2016-07-26 LAB — CBC WITH DIFFERENTIAL/PLATELET
BASOS PCT: 0.5 % (ref 0.0–3.0)
Basophils Absolute: 0 10*3/uL (ref 0.0–0.1)
EOS ABS: 0.1 10*3/uL (ref 0.0–0.7)
Eosinophils Relative: 1.3 % (ref 0.0–5.0)
HCT: 39.1 % (ref 36.0–46.0)
HEMOGLOBIN: 13.4 g/dL (ref 12.0–15.0)
LYMPHS ABS: 4.1 10*3/uL — AB (ref 0.7–4.0)
Lymphocytes Relative: 43.4 % (ref 12.0–46.0)
MCHC: 34.1 g/dL (ref 30.0–36.0)
MCV: 90.9 fl (ref 78.0–100.0)
MONO ABS: 0.7 10*3/uL (ref 0.1–1.0)
Monocytes Relative: 7.8 % (ref 3.0–12.0)
NEUTROS PCT: 47 % (ref 43.0–77.0)
Neutro Abs: 4.4 10*3/uL (ref 1.4–7.7)
PLATELETS: 288 10*3/uL (ref 150.0–400.0)
RBC: 4.3 Mil/uL (ref 3.87–5.11)
RDW: 13.8 % (ref 11.5–15.5)
WBC: 9.5 10*3/uL (ref 4.0–10.5)

## 2016-07-26 LAB — MICROALBUMIN / CREATININE URINE RATIO
Creatinine,U: 74.1 mg/dL
Microalb Creat Ratio: 2.8 mg/g (ref 0.0–30.0)
Microalb, Ur: 2.1 mg/dL — ABNORMAL HIGH (ref 0.0–1.9)

## 2016-07-26 LAB — TSH: TSH: 1.94 u[IU]/mL (ref 0.35–4.50)

## 2016-07-26 LAB — HEMOGLOBIN A1C: Hgb A1c MFr Bld: 7.2 % — ABNORMAL HIGH (ref 4.6–6.5)

## 2016-07-26 MED ORDER — VITAMIN B-12 1000 MCG PO TABS: 1000.0000 ug | ORAL_TABLET | Freq: Every day | ORAL | Status: DC

## 2016-07-26 MED ORDER — DULOXETINE HCL 20 MG PO CPEP: 20.0000 mg | ORAL_CAPSULE | Freq: Every day | ORAL | 5 refills | Status: DC

## 2016-07-26 NOTE — Assessment & Plan Note (Signed)
Following with Dr Posey Pronto No seizure like activity

## 2016-07-26 NOTE — Assessment & Plan Note (Signed)
Check a1c Continue current medication Increase activity Work on weight loss

## 2016-07-26 NOTE — Assessment & Plan Note (Signed)
Has been taking paxil prn -- will d/c Start cymbalta for nerve pain She feels anxiety is not bad, but cymbalta will help for intermittent anxiety

## 2016-07-26 NOTE — Progress Notes (Signed)
Pre visit review using our clinic review tool, if applicable. No additional management support is needed unless otherwise documented below in the visit note. 

## 2016-07-26 NOTE — Assessment & Plan Note (Signed)
Takes medication as needed

## 2016-07-26 NOTE — Progress Notes (Signed)
Subjective:    Patient ID: [name], female    DOB: [birth year], 76 y.o.   MRN: [id number]  HPI The patient is here for follow up.  Her husband died four months ago.   Diabetes, neuropathy: She is taking her medication daily as prescribed. She is more compliant with a diabetic diet, but over the past several months she was not eating healthy. She is not exercising regularly. She monitors her sugars and they have been running 114, 100, 122, 135 . She checks her feet daily and denies foot lesions. She is up-to-date with an ophthalmology examination.  She is taking gabapentin 800 mg but it does not seem to be helping.  lyrica was too expensive - she did not take it long enough to know if it worked.    She fell in 2016 or 2017 - she was changing a light bulb at home.  She also fell at church.  She has neck pain, especially when she turns her neck.  She has back pain.  She has pain all over her body.   OA: She has diffuse arthritic pain.   She has left knee OA and has seen ortho.  Ortho recommended surgery - TKR.  She is going to [facility].  Nothing helps with the pain.  She wants to avoid surgery.   IBS:  She is doing what she is supposed to be doing.  She has constipation.  She wonders if she is due for a colonoscopy.  Recently when she urinates she has had a burning sensation. She wonders about a UTI.   Medications and allergies reviewed with patient and updated if appropriate.  Patient Active Problem List   Diagnosis Date Noted  . Anxiety 09/14/2015  . Muscle pain, myofacial 05/19/2015  . Lower extremity edema 12/16/2014  . Pelvic cyst   . Diverticulitis large intestine w/o perforation or abscess w/o bleeding 05/12/2014  . Essential hypertension 05/12/2014  . DM neuropathy, type II diabetes mellitus ([place]) 05/12/2014  . OSA on CPAP 05/12/2014  . Depression 05/12/2014  . Seizure disorder ([place]) 05/12/2014  . Abnormal CT scan, pelvis 05/12/2014  . Hepatic steatosis 05/12/2014    . Obesity (BMI 30-39.9) 05/12/2014  . Hyponatremia   . Cerebrovascular disease, unspecified 03/13/2013  . Allergic rhinitis, cause unspecified 03/13/2013  . FATIGUE 05/25/2009  . GOITER, MULTINODULAR 05/18/2009  . VERTIGO 04/13/2009  . Irritable bowel syndrome 03/20/2009  . INSOMNIA 03/20/2009  . DYSLIPIDEMIA 03/18/2009  . SLEEP APNEA, OBSTRUCTIVE 03/18/2009  . GERD 03/18/2009  . Osteoarthritis 03/18/2009  . SEIZURE DISORDER 03/18/2009    Current Outpatient Prescriptions on File Prior to Visit  Medication Sig Dispense Refill  . albuterol (PROAIR HFA) 108 (90 BASE) MCG/ACT inhaler Inhale 2 puffs into the lungs every 6 (six) hours as needed for wheezing or shortness of breath.     . Alpha-D-Galactosidase (BEANO PO) Take 1-2 tablets by mouth daily as needed (for gas).    . ALPRAZolam (XANAX) 0.25 MG tablet Take 0.25 mg by mouth 2 (two) times daily as needed for anxiety.     Marland Kitchen aspirin 81 MG tablet Take 81 mg by mouth daily.      Marland Kitchen azelastine (ASTELIN) 0.1 % nasal spray Place 2 sprays into both nostrils 2 (two) times daily as needed for rhinitis. Use in each nostril as directed    . Biotin 5000 MCG TABS Take 5,000 mcg by mouth daily. (Patient taking differently: Take 1,000 mcg by mouth daily. ) 30 tablet   .  Blood Glucose Monitoring Suppl (ACCU-CHEK AVIVA PLUS) W/DEVICE KIT 1 kit by Does not apply route once. 1 kit 0  . Cholecalciferol (EQL VITAMIN D3) 1000 UNITS tablet Take 1,000 Units by mouth daily as needed (for vitamin).     . Cyanocobalamin (VITAMIN B 12) 100 MCG LOZG Take 100 mcg by mouth daily as needed (for vitamin).     . cyclobenzaprine (FLEXERIL) 5 MG tablet TAKE 1 TABLET (5 MG TOTAL) BY MOUTH 3 (THREE) TIMES DAILY AS NEEDED FOR MUSCLE SPASMS. 90 tablet 1  . famotidine (PEPCID) 20 MG tablet TAKE 1 TABLET (20 MG TOTAL) BY MOUTH DAILY. 90 tablet 1  . gabapentin (NEURONTIN) 800 MG tablet Take 1 tablet by mouth 3  times daily 270 tablet 2  . glipiZIDE (GLUCOTROL XL) 5 MG 24 hr  tablet TAKE 1 TABLET (5 MG TOTAL) BY MOUTH DAILY. 90 tablet 1  . glucose blood (ACCU-CHEK AVIVA PLUS) test strip Use to check blood sugars twice a day Dx E11.9 200 each 3  . hydrochlorothiazide (MICROZIDE) 12.5 MG capsule TAKE 1 CAPSULE (12.5 MG TOTAL) BY MOUTH DAILY AS NEEDED FOR EDEMA 90 capsule 0  . KLOR-CON M20 20 MEQ tablet TAKE 1 TABLET (20 MEQ TOTAL) BY MOUTH DAILY. 30 tablet 3  . meclizine (ANTIVERT) 12.5 MG tablet Take 1 tablet (12.5 mg total) by mouth 3 (three) times daily as needed for dizziness. 30 tablet 0  . metFORMIN (GLUCOPHAGE-XR) 500 MG 24 hr tablet TAKE 1 TABLET (500 MG TOTAL) BY MOUTH DAILY WITH BREAKFAST. 90 tablet 3  . Multiple Vitamin (MULTIVITAMIN) capsule Take 1 capsule by mouth daily as needed (for vitamin).     . OXcarbazepine (TRILEPTAL) 150 MG tablet TAKE 1.5 TABLETS (225 MG TOTAL) BY MOUTH 2 (TWO) TIMES DAILY. 140 tablet 3  . PARoxetine (PAXIL) 10 MG tablet Take 1 tablet (10 mg total) by mouth daily. (Patient taking differently: Take 10 mg by mouth daily as needed (anxiety). ) 30 tablet 3  . polyethylene glycol powder (GLYCOLAX/MIRALAX) powder Take 17 g by mouth daily. (Patient taking differently: Take 17 g by mouth daily as needed for mild constipation. ) 500 g 0  . Probiotic Product (PROBIOTIC PO) Take 1 capsule by mouth daily after breakfast.     . ranitidine (ZANTAC) 150 MG tablet Take 1 tablet (150 mg total) by mouth 2 (two) times daily. (Patient taking differently: Take 150 mg by mouth 2 (two) times daily as needed for heartburn. ) 60 tablet 5  . traZODone (DESYREL) 50 MG tablet Take 1 tablet (50 mg total) by mouth at bedtime. 30 tablet 3   No current facility-administered medications on file prior to visit.     Past Medical History:  Diagnosis Date  . Allergic rhinitis, cause unspecified 03/13/2013  . Anxiety   . Cerebrovascular disease, unspecified 03/13/2013   Atrophy and small vessel dz noted, MR brain 2009  . Depression   . Diabetes mellitus, type 2  ([place])   . Diverticulosis of colon 03/2010 hosp  . Dyslipidemia   . GERD (gastroesophageal reflux disease)   . Hypertension   . Neuropathy ([place])   . OSA on CPAP   . Osteoarthritis of shoulder region    and Knee  . Seizure disorder [facility])     Past Surgical History:  Procedure Laterality Date  . ABDOMINAL HYSTERECTOMY  1970's   Partial  . APPENDECTOMY    . CHOLECYSTECTOMY    . SHOULDER SURGERY  2008   LT, post fall   .  TONSILLECTOMY AND ADENOIDECTOMY      Social History   Social History  . Marital status: Married    Spouse name: N/A  . Number of children: N/A  . Years of education: N/A   Social History Main Topics  . Smoking status: Former Smoker    Quit date: 10/19/1985  . Smokeless tobacco: Never Used  . Alcohol use 0.0 oz/week  . Drug use: No  . Sexual activity: Not Asked   Other Topics Concern  . None   Social History Narrative   Patient lives in a one story home with her husband.  Has 2 children.  Retired from [employer].    Family History  Problem Relation Age of Onset  . Arthritis Mother   . Heart disease Father   . Arthritis Other     Grandmother  . Diabetes Other     Grandmother    Review of Systems  Constitutional: Negative for fever.  Respiratory: Positive for wheezing (occ). Negative for cough and shortness of breath.   Cardiovascular: Positive for chest pain (central chest, inttermittent) and palpitations. Negative for leg swelling.  Musculoskeletal: Positive for arthralgias (diffuse) and back pain.  Neurological: Positive for numbness (b/l feet and legs from neuropathy; numbness/tingling in hands) and headaches (occ).  Psychiatric/Behavioral: Negative for dysphoric mood. The patient is not nervous/anxious.        Objective:   Vitals:   07/26/16 1415  BP: 126/76  Pulse: 74  Resp: 16  Temp: 98.4 F (36.9 C)   Wt Readings from Last 3 Encounters:  07/26/16 180 lb (81.6 kg)  02/03/16 182 lb (82.6 kg)  09/14/15 180 lb (81.6 kg)   Body  mass index is 32.92 kg/m.   Physical Exam    Constitutional: Appears well-developed and well-nourished. No distress.  HENT:  Head: Normocephalic and atraumatic.  Neck: Neck supple. No tracheal deviation present. No thyromegaly present.  No cervical lymphadenopathy Cardiovascular: Normal rate, regular rhythm and normal heart sounds.   No murmur heard. No carotid bruit .  No edema Pulmonary/Chest: Effort normal and breath sounds normal. No respiratory distress. No has no wheezes. No rales.  Skin: Skin is warm and dry. Not diaphoretic.  Psychiatric: Normal mood and affect. Behavior is normal.      Assessment & Plan:    See Problem List for Assessment and Plan of chronic medical problems.   FU in 4-6 weeks

## 2016-07-26 NOTE — Patient Instructions (Addendum)
  Test(s) ordered today. Your results will be released to [place] (or called to you) after review, usually within 72hours after test completion. If any changes need to be made, you will be notified at that same time.   No immunizations administered today.   Medications reviewed and updated.  Changes include stopping paxil (paroxetine) and start cymbalta 20 mg daily.     Please followup in 4-6 weeks

## 2016-07-26 NOTE — Assessment & Plan Note (Signed)
Constipation Continue bowel regimen and prunes Increase activity

## 2016-07-26 NOTE — Assessment & Plan Note (Addendum)
Neuropathy - pain not controlled Taking gabapentin but not full dose - increase dose Start cymbalta 20 mg daily - will see if that helps Consider trying lyrica - was too expensive in the past FU in 4 weeks

## 2016-07-26 NOTE — Assessment & Plan Note (Signed)
BP well controlled Current regimen effective and well tolerated Continue current medications at current doses cmp  

## 2016-07-26 NOTE — Assessment & Plan Note (Signed)
Denies depression 

## 2016-07-27 ENCOUNTER — Other Ambulatory Visit: Payer: Self-pay | Admitting: Internal Medicine

## 2016-07-28 LAB — URINE CULTURE: ORGANISM ID, BACTERIA: NO GROWTH

## 2016-08-19 ENCOUNTER — Other Ambulatory Visit: Payer: Self-pay | Admitting: Internal Medicine

## 2016-08-23 ENCOUNTER — Ambulatory Visit (INDEPENDENT_AMBULATORY_CARE_PROVIDER_SITE_OTHER): Payer: Medicare Other | Admitting: Internal Medicine

## 2016-08-23 ENCOUNTER — Encounter: Payer: Self-pay | Admitting: Internal Medicine

## 2016-08-23 VITALS — BP 134/70 | HR 83 | Temp 98.3°F | Resp 16 | Wt 180.0 lb

## 2016-08-23 DIAGNOSIS — G8929 Other chronic pain: Secondary | ICD-10-CM

## 2016-08-23 DIAGNOSIS — M549 Dorsalgia, unspecified: Secondary | ICD-10-CM

## 2016-08-23 DIAGNOSIS — M545 Low back pain, unspecified: Secondary | ICD-10-CM

## 2016-08-23 DIAGNOSIS — E1142 Type 2 diabetes mellitus with diabetic polyneuropathy: Secondary | ICD-10-CM | POA: Diagnosis not present

## 2016-08-23 DIAGNOSIS — E1140 Type 2 diabetes mellitus with diabetic neuropathy, unspecified: Secondary | ICD-10-CM

## 2016-08-23 DIAGNOSIS — H9209 Otalgia, unspecified ear: Secondary | ICD-10-CM | POA: Diagnosis not present

## 2016-08-23 DIAGNOSIS — R600 Localized edema: Secondary | ICD-10-CM | POA: Diagnosis not present

## 2016-08-23 DIAGNOSIS — L659 Nonscarring hair loss, unspecified: Secondary | ICD-10-CM | POA: Diagnosis not present

## 2016-08-23 DIAGNOSIS — K588 Other irritable bowel syndrome: Secondary | ICD-10-CM | POA: Diagnosis not present

## 2016-08-23 HISTORY — DX: Low back pain, unspecified: M54.50

## 2016-08-23 MED ORDER — LINACLOTIDE 145 MCG PO CAPS: 145.0000 ug | ORAL_CAPSULE | Freq: Every day | ORAL | 1 refills | Status: DC

## 2016-08-23 MED ORDER — VENLAFAXINE HCL ER 37.5 MG PO CP24: 37.5000 mg | ORAL_CAPSULE | Freq: Every day | ORAL | 5 refills | Status: DC

## 2016-08-23 MED ORDER — HYDROCHLOROTHIAZIDE 25 MG PO TABS: 25.0000 mg | ORAL_TABLET | Freq: Every day | ORAL | 3 refills | Status: DC

## 2016-08-23 NOTE — Assessment & Plan Note (Signed)
Stop cymbalta - causing hives/itching Continue gabapentin 800 mg three times a day Trial of effexor 37.5 mg daily - if tolerated will increase Consider neurology evaluation

## 2016-08-23 NOTE — Patient Instructions (Signed)
  Medications reviewed and updated.  Changes include stopping the cymbalta.  Start effexor for your nerve pain.   Try the samples of Linzess for your constipation.    Your prescription(s) have been submitted to your pharmacy. Please take as directed and contact our office if you believe you are having problem(s) with the medication(s).  A referral was ordered for orthopedics for your spine pain and ENT for your ear / neck pain.    Please followup in 2 months

## 2016-08-23 NOTE — Progress Notes (Signed)
Pre visit review using our clinic review tool, if applicable. No additional management support is needed unless otherwise documented below in the visit note. 

## 2016-08-23 NOTE — Assessment & Plan Note (Addendum)
Has chronic constipation - taking probiotic and stool softener miralax did not help experiences daily bloating - no matter what she eats.  Trial linzess - samples given for 145 mg

## 2016-08-23 NOTE — Assessment & Plan Note (Signed)
Following with derm Unsure if one of her medication is causing the hair loss  Discussed d/cing hctz, but she feels she needs it due to edema - wants to increase it to 25 mg daily

## 2016-08-23 NOTE — Progress Notes (Signed)
Subjective:    Patient ID: [name], female    DOB: 1941/05/01, 76 y.o.   MRN: [id number]  HPI The patient is here for follow up.  Diabetic Neuropathy: At her last visit approximately 1 month ago we started her on Cymbalta for her nerve pain.  We continued her gabapentin 800 mg 3 times a day.  She has had hives and itching since starting the cymbalta.  She has noticed some improvement in her nerve pain with the cymbalta.  She has tried lyrica in the past and it was either too expensive or not effective.    Diabetes:  Her A1c one month ago was 7.2%.  She is taking all of her medications as prescribed.  She is trying to eat a diabetic diet.  She is not very active.   IBS, constipation:  She is experiencing abdominal bloating.  No matter what she eats she has abdominal bloating.  She has normal BMs.  She takes a stool softener daily. Mirlax does not work.  She thinks her medications are contributing.  She exercise as much as possible with her knee OA.  She drinks lots of water.   Hair loss:  She has hair loss -  Worse in the past 3 months.  She saw derm and had injections, but it did not help much.  They are unsure what is the cause.   She has a dull headache and is unsure if this is related to the hair loss.   Leg edema:  She is taking her hctz daily.  She still has leg edema and thinks she needs a higher dose.    Medications and allergies reviewed with patient and updated if appropriate.  Patient Active Problem List   Diagnosis Date Noted  . Diabetes ([place]) 07/26/2016  . Anxiety 09/14/2015  . Muscle pain, myofacial 05/19/2015  . Lower extremity edema 12/16/2014  . Pelvic cyst   . Diverticulitis large intestine w/o perforation or abscess w/o bleeding 05/12/2014  . Essential hypertension 05/12/2014  . DM neuropathy, type II diabetes mellitus ([place]) 05/12/2014  . OSA on CPAP 05/12/2014  . Depression 05/12/2014  . Seizure disorder ([place]) 05/12/2014  . Abnormal CT scan, pelvis  05/12/2014  . Hepatic steatosis 05/12/2014  . Obesity (BMI 30-39.9) 05/12/2014  . Cerebrovascular disease, unspecified 03/13/2013  . Allergic rhinitis, cause unspecified 03/13/2013  . GOITER, MULTINODULAR 05/18/2009  . VERTIGO 04/13/2009  . Irritable bowel syndrome 03/20/2009  . INSOMNIA 03/20/2009  . Hyperlipidemia 03/18/2009  . SLEEP APNEA, OBSTRUCTIVE 03/18/2009  . GERD 03/18/2009  . Osteoarthritis 03/18/2009  . SEIZURE DISORDER 03/18/2009    Current Outpatient Prescriptions on File Prior to Visit  Medication Sig Dispense Refill  . albuterol (PROAIR HFA) 108 (90 BASE) MCG/ACT inhaler Inhale 2 puffs into the lungs every 6 (six) hours as needed for wheezing or shortness of breath.     . Alpha-D-Galactosidase (BEANO PO) Take 1-2 tablets by mouth daily as needed (for gas).    . ALPRAZolam (XANAX) 0.25 MG tablet Take 0.25 mg by mouth 2 (two) times daily as needed for anxiety.     Marland Kitchen aspirin 81 MG tablet Take 81 mg by mouth daily.      Marland Kitchen azelastine (ASTELIN) 0.1 % nasal spray Place 2 sprays into both nostrils 2 (two) times daily as needed for rhinitis. Use in each nostril as directed    . Biotin 5000 MCG TABS Take 5,000 mcg by mouth daily. (Patient taking differently: Take 1,000 mcg by mouth  daily. ) 30 tablet   . Blood Glucose Monitoring Suppl (ACCU-CHEK AVIVA PLUS) W/DEVICE KIT 1 kit by Does not apply route once. 1 kit 0  . Cholecalciferol (EQL VITAMIN D3) 1000 UNITS tablet Take 1,000 Units by mouth daily as needed (for vitamin).     . cyclobenzaprine (FLEXERIL) 5 MG tablet TAKE 1 TABLET (5 MG TOTAL) BY MOUTH 3 (THREE) TIMES DAILY AS NEEDED FOR MUSCLE SPASMS. 90 tablet 1  . DULoxetine (CYMBALTA) 20 MG capsule Take 1 capsule (20 mg total) by mouth daily. 30 capsule 5  . famotidine (PEPCID) 20 MG tablet TAKE 1 TABLET (20 MG TOTAL) BY MOUTH DAILY. 90 tablet 1  . gabapentin (NEURONTIN) 800 MG tablet TAKE 1 TABLET BY MOUTH 3  TIMES DAILY 270 tablet 1  . glipiZIDE (GLUCOTROL XL) 5 MG 24 hr  tablet TAKE 1 TABLET (5 MG TOTAL) BY MOUTH DAILY. 90 tablet 1  . glucose blood (ACCU-CHEK AVIVA PLUS) test strip Use to check blood sugars twice a day Dx E11.9 200 each 3  . hydrochlorothiazide (MICROZIDE) 12.5 MG capsule TAKE 1 CAPSULE (12.5 MG TOTAL) BY MOUTH DAILY AS NEEDED FOR EDEMA 90 capsule 0  . KLOR-CON M20 20 MEQ tablet TAKE 1 TABLET (20 MEQ TOTAL) BY MOUTH DAILY. 30 tablet 3  . meclizine (ANTIVERT) 12.5 MG tablet Take 1 tablet (12.5 mg total) by mouth 3 (three) times daily as needed for dizziness. 30 tablet 0  . metFORMIN (GLUCOPHAGE-XR) 500 MG 24 hr tablet TAKE 1 TABLET (500 MG TOTAL) BY MOUTH DAILY WITH BREAKFAST. 90 tablet 3  . Multiple Vitamin (MULTIVITAMIN) capsule Take 1 capsule by mouth daily as needed (for vitamin).     . OXcarbazepine (TRILEPTAL) 150 MG tablet TAKE 1.5 TABLETS (225 MG TOTAL) BY MOUTH 2 (TWO) TIMES DAILY. 140 tablet 3  . polyethylene glycol powder (GLYCOLAX/MIRALAX) powder Take 17 g by mouth daily. (Patient taking differently: Take 17 g by mouth daily as needed for mild constipation. ) 500 g 0  . Probiotic Product (PROBIOTIC PO) Take 1 capsule by mouth daily after breakfast.     . ranitidine (ZANTAC) 150 MG tablet Take 1 tablet (150 mg total) by mouth 2 (two) times daily. (Patient taking differently: Take 150 mg by mouth 2 (two) times daily as needed for heartburn. ) 60 tablet 5  . vitamin B-12 (CYANOCOBALAMIN) 1000 MCG tablet Take 1 tablet (1,000 mcg total) by mouth daily.     No current facility-administered medications on file prior to visit.     Past Medical History:  Diagnosis Date  . Allergic rhinitis, cause unspecified 03/13/2013  . Anxiety   . Cerebrovascular disease, unspecified 03/13/2013   Atrophy and small vessel dz noted, MR brain 2009  . Depression   . Diabetes mellitus, type 2 ([place])   . Diverticulosis of colon 03/2010 hosp  . Dyslipidemia   . GERD (gastroesophageal reflux disease)   . Hypertension   . Neuropathy ([place])   . OSA on CPAP   .  Osteoarthritis of shoulder region    and Knee  . Seizure disorder [facility])     Past Surgical History:  Procedure Laterality Date  . ABDOMINAL HYSTERECTOMY  1970's   Partial  . APPENDECTOMY    . CHOLECYSTECTOMY    . SHOULDER SURGERY  2008   LT, post fall   . TONSILLECTOMY AND ADENOIDECTOMY      Social History   Social History  . Marital status: Married    Spouse name: N/A  . Number of  children: N/A  . Years of education: N/A   Social History Main Topics  . Smoking status: Former Smoker    Quit date: 10/19/1985  . Smokeless tobacco: Never Used  . Alcohol use 0.0 oz/week  . Drug use: No  . Sexual activity: Not on file   Other Topics Concern  . Not on file   Social History Narrative   Patient lives in a one story home with her husband.  Has 2 children.  Retired from [employer].    Family History  Problem Relation Age of Onset  . Arthritis Mother   . Heart disease Father   . Arthritis Other     Grandmother  . Diabetes Other     Grandmother    Review of Systems  Constitutional: Negative for fever.  HENT: Positive for ear pain. Negative for sore throat.   Respiratory: Positive for shortness of breath and wheezing (mild). Negative for cough.   Cardiovascular: Positive for chest pain (occ, left sided with activity- not increasing in frequency), palpitations (occ) and leg swelling.  Neurological: Positive for headaches. Negative for light-headedness.       Objective:   Vitals:   08/23/16 1327  BP: 134/70  Pulse: 83  Resp: 16  Temp: 98.3 F (36.8 C)   Wt Readings from Last 3 Encounters:  08/23/16 180 lb (81.6 kg)  07/26/16 180 lb (81.6 kg)  02/03/16 182 lb (82.6 kg)   Body mass index is 32.92 kg/m.   Physical Exam    Constitutional: Appears well-developed and well-nourished. No distress.  HENT:  Head: Normocephalic and atraumatic.  Neck: Neck supple. No tracheal deviation present. No thyromegaly present.  No cervical lymphadenopathy Cardiovascular:  Normal rate, regular rhythm and normal heart sounds.   No murmur heard. No carotid bruit .  No edema Pulmonary/Chest: Effort normal and breath sounds normal. No respiratory distress. No has no wheezes. No rales.  Skin: Skin is warm and dry. Not diaphoretic.  Psychiatric: Normal mood and affect. Behavior is normal.      Assessment & Plan:    See Problem List for Assessment and Plan of chronic medical problems.

## 2016-08-23 NOTE — Assessment & Plan Note (Signed)
Lab Results  Component Value Date   HGBA1C 7.2 (H) 07/26/2016   Controlled Continue current medication

## 2016-08-23 NOTE — Assessment & Plan Note (Signed)
Still having leg edema on hctz 12.5 mg  Wants to increase to 25 mg daily - will increase

## 2016-08-25 ENCOUNTER — Other Ambulatory Visit: Payer: Self-pay | Admitting: Internal Medicine

## 2016-08-31 ENCOUNTER — Emergency Department (HOSPITAL_COMMUNITY): Payer: Medicare Other

## 2016-08-31 ENCOUNTER — Encounter (HOSPITAL_COMMUNITY): Payer: Self-pay | Admitting: Emergency Medicine

## 2016-08-31 ENCOUNTER — Emergency Department (HOSPITAL_COMMUNITY)
Admission: EM | Admit: 2016-08-31 | Discharge: 2016-09-01 | Disposition: A | Discharge status: Home / Self Care | Payer: Medicare Other | Attending: Emergency Medicine | Admitting: Emergency Medicine

## 2016-08-31 DIAGNOSIS — Z7982 Long term (current) use of aspirin: Secondary | ICD-10-CM | POA: Insufficient documentation

## 2016-08-31 DIAGNOSIS — Z7984 Long term (current) use of oral hypoglycemic drugs: Secondary | ICD-10-CM | POA: Insufficient documentation

## 2016-08-31 DIAGNOSIS — M545 Low back pain: Secondary | ICD-10-CM

## 2016-08-31 DIAGNOSIS — M5136 Other intervertebral disc degeneration, lumbar region: Secondary | ICD-10-CM

## 2016-08-31 DIAGNOSIS — E1140 Type 2 diabetes mellitus with diabetic neuropathy, unspecified: Secondary | ICD-10-CM | POA: Insufficient documentation

## 2016-08-31 DIAGNOSIS — I10 Essential (primary) hypertension: Secondary | ICD-10-CM | POA: Insufficient documentation

## 2016-08-31 DIAGNOSIS — Z87891 Personal history of nicotine dependence: Secondary | ICD-10-CM | POA: Insufficient documentation

## 2016-08-31 DIAGNOSIS — Z8673 Personal history of transient ischemic attack (TIA), and cerebral infarction without residual deficits: Secondary | ICD-10-CM | POA: Insufficient documentation

## 2016-08-31 DIAGNOSIS — G8929 Other chronic pain: Secondary | ICD-10-CM

## 2016-08-31 LAB — CBG MONITORING, ED: GLUCOSE-CAPILLARY: 113 mg/dL — AB (ref 65–99)

## 2016-08-31 MED ORDER — TRAMADOL HCL 50 MG PO TABS
50.0000 mg | ORAL_TABLET | Freq: Once | ORAL | Status: AC
  Administered 2016-08-31: 50 mg via ORAL
  Filled 2016-08-31: qty 1

## 2016-08-31 NOTE — ED Provider Notes (Signed)
[facility] DEPT Provider Note   CSN: [id number] Arrival date & time: 08/31/16  3149     History   Chief Complaint Chief Complaint  Patient presents with  . Back Pain  . Fall    HPI [name] is a 76 y.o. female.  HPI   Patient is a 76 year old female with history of diabetes, hypertension and CVA who presents to the emergency department complaining of gradually worsening back pain. Patient reports having intermittent back pain for the past few months which she notes has gradually worsened over the past few days. She reports having constant pressure-like pain down her spine and across her lower back. She notes the pain will radiate into the sides of both of her hips. Patient states pain is worse with movement. Patient notes she fell over 6 months ago but denies any other recent falls, trauma or injury. Denies any other recent activity or heavy lifting. Denies taking any medications at home for her symptoms. Pt denies fever, numbness, tingling, saddle anesthesia, loss of bowel or bladder, weakness, chest pain, shortness of breath, abdominal pain, vomiting, diarrhea, urinary symptoms, IVDU, cancer or recent spinal manipulation. Patient reports using a cane to ambulate at home as needed but states she has been using it more frequently over the past few days. Denies use of anticoagulants.  Past Medical History:  Diagnosis Date  . Allergic rhinitis, cause unspecified 03/13/2013  . Anxiety   . Cerebrovascular disease, unspecified 03/13/2013   Atrophy and small vessel dz noted, MR brain 2009  . Depression   . Diabetes mellitus, type 2 ([place])   . Diverticulosis of colon 03/2010 hosp  . Dyslipidemia   . GERD (gastroesophageal reflux disease)   . Hypertension   . Neuropathy ([place])   . OSA on CPAP   . Osteoarthritis of shoulder region    and Knee  . Seizure disorder [facility])     Patient Active Problem List   Diagnosis Date Noted  . Chronic back pain 08/23/2016  . Hair loss 08/23/2016   . Diabetes ([place]) 07/26/2016  . Anxiety 09/14/2015  . Muscle pain, myofacial 05/19/2015  . Lower extremity edema 12/16/2014  . Pelvic cyst   . Diverticulitis large intestine w/o perforation or abscess w/o bleeding 05/12/2014  . Essential hypertension 05/12/2014  . DM neuropathy, type II diabetes mellitus ([place]) 05/12/2014  . OSA on CPAP 05/12/2014  . Depression 05/12/2014  . Seizure disorder ([place]) 05/12/2014  . Abnormal CT scan, pelvis 05/12/2014  . Hepatic steatosis 05/12/2014  . Obesity (BMI 30-39.9) 05/12/2014  . Cerebrovascular disease, unspecified 03/13/2013  . Allergic rhinitis, cause unspecified 03/13/2013  . GOITER, MULTINODULAR 05/18/2009  . VERTIGO 04/13/2009  . Irritable bowel syndrome 03/20/2009  . INSOMNIA 03/20/2009  . Hyperlipidemia 03/18/2009  . SLEEP APNEA, OBSTRUCTIVE 03/18/2009  . GERD 03/18/2009  . Osteoarthritis 03/18/2009  . SEIZURE DISORDER 03/18/2009    Past Surgical History:  Procedure Laterality Date  . ABDOMINAL HYSTERECTOMY  1970's   Partial  . APPENDECTOMY    . CHOLECYSTECTOMY    . SHOULDER SURGERY  2008   LT, post fall   . TONSILLECTOMY AND ADENOIDECTOMY      OB History    No data available       Home Medications    Prior to Admission medications   Medication Sig Start Date End Date Taking? Authorizing Provider  albuterol (PROAIR HFA) 108 (90 BASE) MCG/ACT inhaler Inhale 2 puffs into the lungs every 6 (six) hours as needed for wheezing or shortness  of breath.     Historical Provider, MD  Alpha-D-Galactosidase (BEANO PO) Take 1-2 tablets by mouth daily as needed (for gas).    Historical Provider, MD  ALPRAZolam Duanne Moron) 0.25 MG tablet Take 0.25 mg by mouth 2 (two) times daily as needed for anxiety.  08/11/11   Rowe Clack, MD  aspirin 81 MG tablet Take 81 mg by mouth daily.      Historical Provider, MD  azelastine (ASTELIN) 0.1 % nasal spray Place 2 sprays into both nostrils 2 (two) times daily as needed for rhinitis. Use in each  nostril as directed    Historical Provider, MD  Biotin 5000 MCG TABS Take 5,000 mcg by mouth daily. Patient taking differently: Take 1,000 mcg by mouth daily.  11/29/12   Rowe Clack, MD  Blood Glucose Monitoring Suppl (ACCU-CHEK AVIVA PLUS) W/DEVICE KIT 1 kit by Does not apply route once. 11/13/12   Rowe Clack, MD  Cholecalciferol (EQL VITAMIN D3) 1000 UNITS tablet Take 1,000 Units by mouth daily as needed (for vitamin).     Historical Provider, MD  cyclobenzaprine (FLEXERIL) 5 MG tablet TAKE 1 TABLET (5 MG TOTAL) BY MOUTH 3 (THREE) TIMES DAILY AS NEEDED FOR MUSCLE SPASMS. 05/19/15   Binnie Rail, MD  famotidine (PEPCID) 20 MG tablet TAKE 1 TABLET (20 MG TOTAL) BY MOUTH DAILY. 06/17/16   Binnie Rail, MD  gabapentin (NEURONTIN) 800 MG tablet TAKE 1 TABLET BY MOUTH 3  TIMES DAILY 07/27/16   Binnie Rail, MD  glipiZIDE (GLUCOTROL XL) 5 MG 24 hr tablet TAKE 1 TABLET EVERY DAY 08/25/16   Binnie Rail, MD  glucose blood (ACCU-CHEK AVIVA PLUS) test strip Use to check blood sugars twice a day Dx E11.9 11/27/15   Binnie Rail, MD  hydrochlorothiazide (HYDRODIURIL) 25 MG tablet Take 1 tablet (25 mg total) by mouth daily. 08/23/16   Binnie Rail, MD  KLOR-CON M20 20 MEQ tablet TAKE 1 TABLET (20 MEQ TOTAL) BY MOUTH DAILY. 02/24/16   Binnie Rail, MD  linaclotide Rolan Lipa) 145 MCG CAPS capsule Take 1 capsule (145 mcg total) by mouth daily before breakfast. 08/23/16   Binnie Rail, MD  meclizine (ANTIVERT) 12.5 MG tablet Take 1 tablet (12.5 mg total) by mouth 3 (three) times daily as needed for dizziness. 09/14/15   Binnie Rail, MD  metFORMIN (GLUCOPHAGE-XR) 500 MG 24 hr tablet TAKE 1 TABLET (500 MG TOTAL) BY MOUTH DAILY WITH BREAKFAST. 08/19/16   Binnie Rail, MD  Multiple Vitamin (MULTIVITAMIN) capsule Take 1 capsule by mouth daily as needed (for vitamin).     Historical Provider, MD  naproxen (NAPROSYN) 500 MG tablet Take 1 tablet (500 mg total) by mouth 2 (two) times daily. 09/01/16   Nona Dell, PA-C  OXcarbazepine (TRILEPTAL) 150 MG tablet TAKE 1.5 TABLETS (225 MG TOTAL) BY MOUTH 2 (TWO) TIMES DAILY. 12/10/15   Donika K Patel, DO  polyethylene glycol powder (GLYCOLAX/MIRALAX) powder Take 17 g by mouth daily. Patient taking differently: Take 17 g by mouth daily as needed for mild constipation.  06/11/14   Rowe Clack, MD  Probiotic Product (PROBIOTIC PO) Take 1 capsule by mouth daily after breakfast.     Historical Provider, MD  ranitidine (ZANTAC) 150 MG tablet Take 1 tablet (150 mg total) by mouth 2 (two) times daily. Patient taking differently: Take 150 mg by mouth 2 (two) times daily as needed for heartburn.  09/14/15   Binnie Rail, MD  venlafaxine XR (EFFEXOR XR) 37.5 MG 24 hr capsule Take 1 capsule (37.5 mg total) by mouth daily with breakfast. 08/23/16   Binnie Rail, MD  vitamin B-12 (CYANOCOBALAMIN) 1000 MCG tablet Take 1 tablet (1,000 mcg total) by mouth daily. 07/26/16   Binnie Rail, MD    Family History Family History  Problem Relation Age of Onset  . Arthritis Mother   . Heart disease Father   . Arthritis Other     Grandmother  . Diabetes Other     Grandmother    Social History Social History  Substance Use Topics  . Smoking status: Former Smoker    Quit date: 10/19/1985  . Smokeless tobacco: Never Used  . Alcohol use 0.0 oz/week     Allergies   Cymbalta [duloxetine hcl]; Other; Statins; and Penicillins   Review of Systems Review of Systems  Musculoskeletal: Positive for arthralgias and back pain.  All other systems reviewed and are negative.    Physical Exam Updated Vital Signs BP 142/66 (BP Location: Left Arm)   Pulse 66   Temp 97.8 F (36.6 C) (Oral)   Resp 18   SpO2 98%   Physical Exam  Constitutional: She is oriented to person, place, and time. She appears well-developed and well-nourished. No distress.  HENT:  Head: Normocephalic and atraumatic.  Eyes: Conjunctivae and EOM are normal. Right eye exhibits no  discharge. Left eye exhibits no discharge. No scleral icterus.  Neck: Normal range of motion. Neck supple.  Cardiovascular: Normal rate, regular rhythm, normal heart sounds and intact distal pulses.   Pulmonary/Chest: Effort normal and breath sounds normal. No respiratory distress. She has no wheezes. She has no rales. She exhibits no tenderness.  Abdominal: Soft. Bowel sounds are normal. She exhibits no distension and no mass. There is no tenderness. There is no rebound and no guarding.  Musculoskeletal: Normal range of motion. She exhibits tenderness. She exhibits no edema or deformity.  No midline C, T tenderness. TTP over lumbar spine without any palpable deformity or step-off. TTP over bilateral lumbar paraspinal muscles. Full range of motion of neck and dec ROM of back due to reported pain. Full range of motion of bilateral upper and lower extremities, with 5/5 strength. Negative straight leg raise bilaterally. Sensation intact. 2+ radial and PT pulses. Cap refill <2 seconds. Patient able to stand and ambulate using her cane but endorses pain.  Neurological: She is alert and oriented to person, place, and time. She displays normal reflexes. No sensory deficit.  Skin: Skin is warm and dry. Capillary refill takes less than 2 seconds. She is not diaphoretic.  Nursing note and vitals reviewed.    ED Treatments / Results  Labs (all labs ordered are listed, but only abnormal results are displayed) Labs Reviewed  CBG MONITORING, ED - Abnormal; Notable for the following:       Result Value   Glucose-Capillary 113 (*)    All other components within normal limits    EKG  EKG Interpretation None       Radiology Dg Lumbar Spine Complete  Result Date: 08/31/2016 CLINICAL DATA:  Low back pain EXAM: LUMBAR SPINE - COMPLETE 4+ VIEW COMPARISON:  12/30/2015 FINDINGS: Five non rib-bearing lumbar type vertebra. Mild SI joint arthritis. Lumbar alignment within normal limits. Moderate narrowing and  osteophyte at T12-L1, L1-L2, L2-L3, L3-L4 and L4-L5. Atherosclerosis of the aorta. Facet arthropathy from L2 through the upper sacrum. IMPRESSION: 1. Moderate multilevel degenerative disc changes from T12 through L5. 2. No  acute osseous abnormality. Electronically Signed   By: Donavan Foil M.D.   On: 08/31/2016 23:40    Procedures Procedures (including critical care time)  Medications Ordered in ED Medications  traMADol (ULTRAM) tablet 50 mg (50 mg Oral Given 08/31/16 2249)     Initial Impression / Assessment and Plan / ED Course  I have reviewed the triage vital signs and the nursing notes.  Pertinent labs & imaging results that were available during my care of the patient were reviewed by me and considered in my medical decision making (see chart for details).     Patient presents with back pain that has been present for the past few months. Patient denies any recent fall or injury but states she fell over 6 months ago due to missing a step on her step stool. Denies use of anticoagulants. VSS. Exam showed TTP over lumbar spine without any palpable deformity or step-off, TTP over lumbar paraspinal muscles. Negative straight leg raise.  No neurological deficits and normal neuro exam.  Patient can walk but states is painful.  No loss of bowel or bladder control.  No concern for cauda equina.  No fever, night sweats, weight loss, h/o cancer, IVDU. Lumbar spine xray showed moderate multilevel degenerative disc changes from T12-L5, no acute osseous abnormality. Plan to d/c pt home with RICE protocol and pain medicine. Advised patient to follow up with PCP as needed. Discussed strict return precautions.  Final Clinical Impressions(s) / ED Diagnoses   Final diagnoses:  Chronic bilateral low back pain without sciatica  Degenerative disc disease, lumbar    New Prescriptions New Prescriptions   NAPROXEN (NAPROSYN) 500 MG TABLET    Take 1 tablet (500 mg total) by mouth 2 (two) times daily.       Chesley Noon [place], [place] 09/01/16 0008    Charlesetta Shanks, MD 09/02/16 [phone]

## 2016-08-31 NOTE — ED Triage Notes (Signed)
Pt. Stated, I fell like 6 months ago and now my back hurts at the lower back all the way down to tailbone.  My body hurts all over if I move.

## 2016-08-31 NOTE — ED Notes (Signed)
PA at bedside.

## 2016-08-31 NOTE — ED Notes (Signed)
No answer

## 2016-09-01 MED ORDER — NAPROXEN 500 MG PO TABS: 500.0000 mg | ORAL_TABLET | Freq: Two times a day (BID) | ORAL | 0 refills | Status: DC

## 2016-09-01 NOTE — ED Notes (Signed)
Pt ambulatory to BR and back to room with slow but steady gait. Pt verbalized understanding of d/c instructions. Pt to [place] via [place].

## 2016-09-01 NOTE — Discharge Instructions (Signed)
Take your medication as prescribed for pain relief. I also recommend applying ice or heat to affected area for 15-20 minutes 3-4 times daily for additional pain relief. Refrain from doing any heavy lifting, squatting or repetitive movements that exacerbate your symptoms for the next few days. I recommend following up with your primary care provider if your symptoms are not improved within the next week. Please return to the Emergency Department if symptoms worsen or new onset of fever, numbness, tingling, groin anesthesia, chest pain, abdominal pain, urinary symptoms, weakness.

## 2016-09-27 DIAGNOSIS — H6123 Impacted cerumen, bilateral: Secondary | ICD-10-CM | POA: Insufficient documentation

## 2016-09-27 DIAGNOSIS — M542 Cervicalgia: Secondary | ICD-10-CM | POA: Insufficient documentation

## 2016-09-27 DIAGNOSIS — H9113 Presbycusis, bilateral: Secondary | ICD-10-CM

## 2016-09-27 DIAGNOSIS — K219 Gastro-esophageal reflux disease without esophagitis: Secondary | ICD-10-CM

## 2016-09-27 HISTORY — DX: Presbycusis, bilateral: H91.13

## 2016-09-27 HISTORY — DX: Gastro-esophageal reflux disease without esophagitis: K21.9

## 2016-09-27 HISTORY — DX: Cervicalgia: M54.2

## 2016-10-12 ENCOUNTER — Ambulatory Visit (INDEPENDENT_AMBULATORY_CARE_PROVIDER_SITE_OTHER): Payer: Medicare Other | Admitting: Internal Medicine

## 2016-10-12 ENCOUNTER — Other Ambulatory Visit (INDEPENDENT_AMBULATORY_CARE_PROVIDER_SITE_OTHER): Payer: Medicare Other

## 2016-10-12 ENCOUNTER — Encounter: Payer: Self-pay | Admitting: Internal Medicine

## 2016-10-12 VITALS — BP 128/76 | HR 79 | Temp 98.3°F | Resp 16 | Ht 62.0 in | Wt 179.0 lb

## 2016-10-12 DIAGNOSIS — R0609 Other forms of dyspnea: Secondary | ICD-10-CM

## 2016-10-12 DIAGNOSIS — R079 Chest pain, unspecified: Secondary | ICD-10-CM | POA: Diagnosis not present

## 2016-10-12 DIAGNOSIS — F419 Anxiety disorder, unspecified: Secondary | ICD-10-CM

## 2016-10-12 DIAGNOSIS — E1140 Type 2 diabetes mellitus with diabetic neuropathy, unspecified: Secondary | ICD-10-CM | POA: Diagnosis not present

## 2016-10-12 DIAGNOSIS — I10 Essential (primary) hypertension: Secondary | ICD-10-CM | POA: Diagnosis not present

## 2016-10-12 DIAGNOSIS — E1142 Type 2 diabetes mellitus with diabetic polyneuropathy: Secondary | ICD-10-CM

## 2016-10-12 DIAGNOSIS — Z01818 Encounter for other preprocedural examination: Secondary | ICD-10-CM | POA: Diagnosis not present

## 2016-10-12 HISTORY — DX: Chest pain, unspecified: R07.9

## 2016-10-12 HISTORY — DX: Hypercalcemia: E83.52

## 2016-10-12 LAB — COMPREHENSIVE METABOLIC PANEL
ALBUMIN: 4.6 g/dL (ref 3.5–5.2)
ALT: 18 U/L (ref 0–35)
AST: 29 U/L (ref 0–37)
Alkaline Phosphatase: 61 U/L (ref 39–117)
BUN: 13 mg/dL (ref 6–23)
CALCIUM: 10.3 mg/dL (ref 8.4–10.5)
CHLORIDE: 92 meq/L — AB (ref 96–112)
CO2: 33 mEq/L — ABNORMAL HIGH (ref 19–32)
Creatinine, Ser: 0.77 mg/dL (ref 0.40–1.20)
GFR: 94.29 mL/min (ref >60.00)
Glucose, Bld: 134 mg/dL — ABNORMAL HIGH (ref 70–99)
POTASSIUM: 3.2 meq/L — AB (ref 3.5–5.1)
SODIUM: 134 meq/L — AB (ref 135–145)
Total Bilirubin: 0.4 mg/dL (ref 0.2–1.2)
Total Protein: 8 g/dL (ref 6.0–8.3)

## 2016-10-12 LAB — HEMOGLOBIN A1C: HEMOGLOBIN A1C: 7 % — AB (ref 4.6–6.5)

## 2016-10-12 MED ORDER — TRAMADOL HCL 50 MG PO TABS: 50.0000 mg | ORAL_TABLET | Freq: Two times a day (BID) | ORAL | 1 refills | Status: DC | PRN

## 2016-10-12 MED ORDER — MELOXICAM 15 MG PO TABS: 15.0000 mg | ORAL_TABLET | Freq: Every day | ORAL | 2 refills | Status: DC | PRN

## 2016-10-12 MED ORDER — CLONAZEPAM 0.5 MG PO TABS: 0.2500 mg | ORAL_TABLET | Freq: Two times a day (BID) | ORAL | 1 refills | Status: DC | PRN

## 2016-10-12 NOTE — Assessment & Plan Note (Signed)
Did not tolerate Effexor Continue gabapentin for now
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
## 2016-10-12 NOTE — Patient Instructions (Signed)
  Test(s) ordered today. Your results will be released to [place] (or called to you) after review, usually within 72hours after test completion. If any changes need to be made, you will be notified at that same time.    Medications reviewed and updated.  Changes include starting clonazepam twice daily as needed.   Your prescription(s) have been submitted to your pharmacy. Please take as directed and contact our office if you believe you are having problem(s) with the medication(s).  A referral was ordered for cardiology  Please followup in 3 months

## 2016-10-12 NOTE — Assessment & Plan Note (Signed)
Lab Results  Component Value Date   HGBA1C 7.0 (H) 10/12/2016   Continue current medications

## 2016-10-12 NOTE — Progress Notes (Signed)
Pre visit review using our clinic review tool, if applicable. No additional management support is needed unless otherwise documented below in the visit note. 

## 2016-10-12 NOTE — Assessment & Plan Note (Signed)
I will send a note to Dr. Maxie Better  stating that she will need to see cardiology before she is cleared for surgery Once cleared by cardiology she is cleared for surgery Refer to cardiology today

## 2016-10-12 NOTE — Assessment & Plan Note (Signed)
For the past 3 weeks she has experienced intermittent chest pain and shortness of breath with exertion, relieved with rest She is to have a total knee replacement She will need to see cardiology for preoperative clearance and further evaluation of her symptoms Referring to cardiology today

## 2016-10-12 NOTE — Assessment & Plan Note (Signed)
BP well controlled Current regimen effective and well tolerated Continue current medications at current doses   BP Readings from Last 3 Encounters:  10/12/16 128/76  08/31/16 142/66  08/23/16 134/70

## 2016-10-12 NOTE — Assessment & Plan Note (Signed)
CMP, PTH

## 2016-10-14 LAB — PTH, INTACT AND CALCIUM

## 2016-10-17 ENCOUNTER — Other Ambulatory Visit: Payer: Self-pay | Admitting: Emergency Medicine

## 2016-10-17 ENCOUNTER — Other Ambulatory Visit: Payer: Self-pay | Admitting: *Deleted

## 2016-10-17 MED ORDER — POTASSIUM CHLORIDE CRYS ER 20 MEQ PO TBCR: 20.0000 meq | EXTENDED_RELEASE_TABLET | Freq: Every day | ORAL | 1 refills | Status: DC

## 2016-10-17 MED ORDER — POTASSIUM CHLORIDE CRYS ER 20 MEQ PO TBCR: 40.0000 meq | EXTENDED_RELEASE_TABLET | Freq: Every day | ORAL | 1 refills | Status: DC

## 2016-10-19 ENCOUNTER — Telehealth: Payer: Self-pay | Admitting: Emergency Medicine

## 2016-10-19 NOTE — Telephone Encounter (Signed)
Noted  

## 2016-10-19 NOTE — Telephone Encounter (Signed)
Did not tolerate -not taking - refuse refill

## 2016-10-19 NOTE — Telephone Encounter (Signed)
[facility] is requesting a refill on Duloxetine, not on current med list. Please advise.

## 2016-10-26 ENCOUNTER — Ambulatory Visit: Payer: Medicare Other | Admitting: Internal Medicine

## 2016-10-26 ENCOUNTER — Telehealth: Payer: Self-pay | Admitting: *Deleted

## 2016-10-26 NOTE — Telephone Encounter (Signed)
Duplicate request again pt is not taking duloxetine...[name]

## 2016-11-04 ENCOUNTER — Telehealth: Payer: Self-pay | Admitting: Emergency Medicine

## 2016-11-04 NOTE — Telephone Encounter (Signed)
One pill only a day - ideally only take prn  - max dose 15mg  per day

## 2016-11-04 NOTE — Telephone Encounter (Signed)
Spoke with pharmacy staff to inform.

## 2016-11-04 NOTE — Telephone Encounter (Signed)
Pt told pharmacy she was told to take 2 tablets PRN of Meloxicam. Please advise, if okay please send new [facility] to [facility]
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
## 2016-11-09 ENCOUNTER — Ambulatory Visit (INDEPENDENT_AMBULATORY_CARE_PROVIDER_SITE_OTHER): Payer: Medicare Other | Admitting: Interventional Cardiology

## 2016-11-09 ENCOUNTER — Encounter: Payer: Self-pay | Admitting: Interventional Cardiology

## 2016-11-09 VITALS — BP 128/76 | HR 63 | Ht 62.0 in | Wt 179.8 lb

## 2016-11-09 DIAGNOSIS — IMO0002 Reserved for concepts with insufficient information to code with codable children: Secondary | ICD-10-CM

## 2016-11-09 DIAGNOSIS — E118 Type 2 diabetes mellitus with unspecified complications: Secondary | ICD-10-CM

## 2016-11-09 DIAGNOSIS — Z01810 Encounter for preprocedural cardiovascular examination: Secondary | ICD-10-CM | POA: Diagnosis not present

## 2016-11-09 DIAGNOSIS — E1165 Type 2 diabetes mellitus with hyperglycemia: Secondary | ICD-10-CM

## 2016-11-09 DIAGNOSIS — I10 Essential (primary) hypertension: Secondary | ICD-10-CM

## 2016-11-09 DIAGNOSIS — R0789 Other chest pain: Secondary | ICD-10-CM

## 2016-11-09 DIAGNOSIS — E785 Hyperlipidemia, unspecified: Secondary | ICD-10-CM | POA: Diagnosis not present

## 2016-11-09 NOTE — Patient Instructions (Signed)
Medication Instructions:  None  Labwork: None  Testing/Procedures: Your physician has requested that you have a lexiscan myoview. For further information please visit [email or web address]. Please follow instruction sheet, as given.   Follow-Up: Your physician recommends that you schedule a follow-up appointment as needed with Dr. Tamala Julian.   Any Other Special Instructions Will Be Listed Below (If Applicable).     If you need a refill on your cardiac medications before your next appointment, please call your pharmacy.

## 2016-11-15 ENCOUNTER — Telehealth (HOSPITAL_COMMUNITY): Payer: Self-pay | Admitting: *Deleted

## 2016-11-15 NOTE — Telephone Encounter (Signed)
Attempted to leave message on voicemail in reference to upcoming appointment scheduled for 11/16/16 but no answer on cell. Phone number given for a call back so details instructions can be given. [place], Ranae Palms

## 2016-11-16 ENCOUNTER — Ambulatory Visit (HOSPITAL_COMMUNITY): Payer: Medicare Other | Attending: Cardiovascular Disease

## 2016-11-16 ENCOUNTER — Encounter (INDEPENDENT_AMBULATORY_CARE_PROVIDER_SITE_OTHER): Payer: Self-pay

## 2016-11-16 DIAGNOSIS — I10 Essential (primary) hypertension: Secondary | ICD-10-CM | POA: Insufficient documentation

## 2016-11-16 DIAGNOSIS — R079 Chest pain, unspecified: Secondary | ICD-10-CM | POA: Diagnosis not present

## 2016-11-16 DIAGNOSIS — Z01810 Encounter for preprocedural cardiovascular examination: Secondary | ICD-10-CM | POA: Insufficient documentation

## 2016-11-16 DIAGNOSIS — Z8249 Family history of ischemic heart disease and other diseases of the circulatory system: Secondary | ICD-10-CM | POA: Insufficient documentation

## 2016-11-16 DIAGNOSIS — R0609 Other forms of dyspnea: Secondary | ICD-10-CM | POA: Diagnosis not present

## 2016-11-16 DIAGNOSIS — R0789 Other chest pain: Secondary | ICD-10-CM | POA: Diagnosis not present

## 2016-11-16 LAB — MYOCARDIAL PERFUSION IMAGING
CHL CUP RESTING HR STRESS: 57 {beats}/min
CSEPPHR: 86 {beats}/min
LV dias vol: 58 mL (ref 46–106)
LVSYSVOL: 9 mL
RATE: 0.2
SDS: 4
SRS: 6
SSS: 10
TID: 1.19

## 2016-11-16 MED ORDER — TECHNETIUM TC 99M TETROFOSMIN IV KIT
32.6000 | PACK | Freq: Once | INTRAVENOUS | Status: AC | PRN
  Administered 2016-11-16: 32.6 via INTRAVENOUS
  Filled 2016-11-16: qty 33

## 2016-11-16 MED ORDER — REGADENOSON 0.4 MG/5ML IV SOLN
0.4000 mg | Freq: Once | INTRAVENOUS | Status: AC
  Administered 2016-11-16: 0.4 mg via INTRAVENOUS

## 2016-11-16 MED ORDER — TECHNETIUM TC 99M TETROFOSMIN IV KIT
10.6000 | PACK | Freq: Once | INTRAVENOUS | Status: AC | PRN
  Administered 2016-11-16: 10.6 via INTRAVENOUS
  Filled 2016-11-16: qty 11

## 2016-11-24 ENCOUNTER — Telehealth: Payer: Self-pay | Admitting: Interventional Cardiology

## 2016-11-24 NOTE — Telephone Encounter (Signed)
Spoke with pt and went over stress test results. Pt verbalized understanding and was appreciative for assistance.

## 2016-11-24 NOTE — Telephone Encounter (Signed)
New message ° ° ° ° ° ° ° ° °Pt calling for results °

## 2016-11-27 ENCOUNTER — Other Ambulatory Visit: Payer: Self-pay | Admitting: Neurology

## 2016-12-06 ENCOUNTER — Telehealth: Payer: Self-pay | Admitting: Internal Medicine

## 2016-12-06 NOTE — Telephone Encounter (Signed)
LVM informing pt that Surgical Clearance was sent on 10/12/16. Per note, pt needed to be cleared by Cardiology.

## 2016-12-06 NOTE — Telephone Encounter (Signed)
Pt called in to check the status of a surgical clearance.  It was coming from Dr Lamar Laundry   Best number [phone]

## 2016-12-14 ENCOUNTER — Other Ambulatory Visit: Payer: Self-pay | Admitting: Internal Medicine

## 2016-12-19 ENCOUNTER — Ambulatory Visit: Payer: Self-pay | Admitting: Specialist

## 2016-12-30 NOTE — Progress Notes (Signed)
LOV/ cardiology clearance with negative stress test Dr Daneen Schick 11-09-16 epic   Stress test, "low risk " 11-16-16 epic  EKG 11-09-16 epic

## 2017-01-01 ENCOUNTER — Other Ambulatory Visit: Payer: Self-pay | Admitting: Internal Medicine

## 2017-01-02 ENCOUNTER — Other Ambulatory Visit: Payer: Self-pay | Admitting: Internal Medicine

## 2017-01-02 NOTE — Telephone Encounter (Signed)
Are you okay with filling this early?

## 2017-01-03 ENCOUNTER — Encounter (HOSPITAL_COMMUNITY)
Admission: RE | Admit: 2017-01-03 | Discharge: 2017-01-03 | Disposition: A | Discharge status: Home / Self Care | Payer: Medicare Other | Source: Ambulatory Visit | Attending: Specialist | Admitting: Specialist

## 2017-01-03 ENCOUNTER — Other Ambulatory Visit (HOSPITAL_COMMUNITY): Payer: Self-pay | Admitting: Emergency Medicine

## 2017-01-03 ENCOUNTER — Encounter (HOSPITAL_COMMUNITY): Payer: Self-pay

## 2017-01-03 DIAGNOSIS — Z01812 Encounter for preprocedural laboratory examination: Secondary | ICD-10-CM | POA: Diagnosis not present

## 2017-01-03 LAB — URINALYSIS, ROUTINE W REFLEX MICROSCOPIC
BILIRUBIN URINE: NEGATIVE
Glucose, UA: NEGATIVE mg/dL
Hgb urine dipstick: NEGATIVE
Ketones, ur: NEGATIVE mg/dL
Leukocytes, UA: NEGATIVE
NITRITE: NEGATIVE
PROTEIN: NEGATIVE mg/dL
SPECIFIC GRAVITY, URINE: 1.012 (ref 1.005–1.030)
pH: 6 (ref 5.0–8.0)

## 2017-01-03 LAB — CBC
HEMATOCRIT: 38.7 % (ref 36.0–46.0)
Hemoglobin: 13.2 g/dL (ref 12.0–15.0)
MCH: 30.1 pg (ref 26.0–34.0)
MCHC: 34.1 g/dL (ref 30.0–36.0)
MCV: 88.4 fL (ref 78.0–100.0)
PLATELETS: 296 10*3/uL (ref 150–400)
RBC: 4.38 MIL/uL (ref 3.87–5.11)
RDW: 13.1 % (ref 11.5–15.5)
WBC: 8.5 10*3/uL (ref 4.0–10.5)

## 2017-01-03 LAB — PROTIME-INR
INR: 1
Prothrombin Time: 13.2 seconds (ref 11.4–15.2)

## 2017-01-03 LAB — BASIC METABOLIC PANEL
Anion gap: 9 (ref 5–15)
BUN: 11 mg/dL (ref 6–20)
CHLORIDE: 94 mmol/L — AB (ref 101–111)
CO2: 32 mmol/L (ref 22–32)
CREATININE: 0.74 mg/dL (ref 0.44–1.00)
Calcium: 10.2 mg/dL (ref 8.9–10.3)
GFR calc Af Amer: >60 mL/min (ref >60)
GFR calc non Af Amer: >60 mL/min (ref >60)
Glucose, Bld: 106 mg/dL — ABNORMAL HIGH (ref 65–99)
Potassium: 4.4 mmol/L (ref 3.5–5.1)
Sodium: 135 mmol/L (ref 135–145)

## 2017-01-03 LAB — SURGICAL PCR SCREEN
MRSA, PCR: NEGATIVE
STAPHYLOCOCCUS AUREUS: NEGATIVE

## 2017-01-03 LAB — GLUCOSE, CAPILLARY: Glucose-Capillary: 139 mg/dL — ABNORMAL HIGH (ref 65–99)

## 2017-01-03 LAB — APTT: aPTT: 33 seconds (ref 24–36)

## 2017-01-03 NOTE — Patient Instructions (Signed)
[name]  01/03/2017   Your procedure is scheduled on: 01-12-17  Report to [facility] Main  Entrance Take [place]  elevators to 3rd floor to  [place] at [phone].   Call this number if you have problems the morning of surgery [phone]    Remember: ONLY 1 PERSON MAY GO WITH YOU TO SHORT STAY TO GET  READY MORNING OF YOUR SURGERY.  Do not eat food or drink liquids :After Midnight.     Take these medicines the morning of surgery with A SIP OF WATER: trileptal, gabapentin, Klonopin as needed, inhaler as needed (may bring to hospital)                                 You may not have any metal on your body including hair pins and              piercings  Do not wear jewelry, make-up, lotions, powders or perfumes, deodorant             Do not wear nail polish.  Do not shave  48 hours prior to surgery.       Do not bring valuables to the hospital. [facility].  Contacts, dentures or bridgework may not be worn into surgery.  Leave suitcase in the car. After surgery it may be brought to your room.              Please read over the following fact sheets you were given: _____________________________________________________________________         How to Manage Your Diabetes Before and After Surgery  Why is it important to control my blood sugar before and after surgery? . Improving blood sugar levels before and after surgery helps healing and can limit problems. . A way of improving blood sugar control is eating a healthy diet by: o  Eating less sugar and carbohydrates o  Increasing activity/exercise o  Talking with your doctor about reaching your blood sugar goals . High blood sugars (greater than 180 mg/dL) can raise your risk of infections and slow your recovery, so you will need to focus on controlling your diabetes during the weeks before surgery. . Make sure that the doctor who takes care of your  diabetes knows about your planned surgery including the date and location.  How do I manage my blood sugar before surgery? . Check your blood sugar at least 4 times a day, starting 2 days before surgery, to make sure that the level is not too high or low. o Check your blood sugar the morning of your surgery when you wake up and every 2 hours until you get to the Short Stay unit. . If your blood sugar is less than 70 mg/dL, you will need to treat for low blood sugar: o Do not take insulin. o Treat a low blood sugar (less than 70 mg/dL) with  cup of clear juice (cranberry or apple), 4 glucose tablets, OR glucose gel. o Recheck blood sugar in 15 minutes after treatment (to make sure it is greater than 70 mg/dL). If your blood sugar is not greater than 70 mg/dL on recheck, call [phone] for further instructions. . Report your blood sugar to the short  stay nurse when you get to Short Stay.  . If you are admitted to the hospital after surgery: o Your blood sugar will be checked by the staff and you will probably be given insulin after surgery (instead of oral diabetes medicines) to make sure you have good blood sugar levels. o The goal for blood sugar control after surgery is 80-180 mg/dL.   WHAT DO I DO ABOUT MY DIABETES MEDICATION?  Marland Kitchen Do not take oral diabetes medicines (pills) the morning of surgery.  . THE DAY BEFORE SURGERY,   take  Metformin as usual    DO NOT TAKE BEDTIME DOSE OF GLIPIZIDE     . THE MORNING OF SURGERY  DO NOT TAKE ANY DIABETIC MEDICATIONS DAY OF YOUR SURGERY   Patient Signature:  Date:   Nurse Signature:  Date:   Reviewed and Endorsed by [facility] Patient Education Committee, August 2015  [facility] - Preparing for Surgery Before surgery, you can play an important role.  Because skin is not sterile, your skin needs to be as free of germs as possible.  You can reduce the number of germs on your skin by washing with CHG (chlorahexidine gluconate) soap  before surgery.  CHG is an antiseptic cleaner which kills germs and bonds with the skin to continue killing germs even after washing. Please DO NOT use if you have an allergy to CHG or antibacterial soaps.  If your skin becomes reddened/irritated stop using the CHG and inform your nurse when you arrive at Short Stay. Do not shave (including legs and underarms) for at least 48 hours prior to the first CHG shower.  You may shave your face/neck. Please follow these instructions carefully:  1.  Shower with CHG Soap the night before surgery and the  morning of Surgery.  2.  If you choose to wash your hair, wash your hair first as usual with your  normal  shampoo.  3.  After you shampoo, rinse your hair and body thoroughly to remove the  shampoo.                           4.  Use CHG as you would any other liquid soap.  You can apply chg directly  to the skin and wash                       Gently with a scrungie or clean washcloth.  5.  Apply the CHG Soap to your body ONLY FROM THE NECK DOWN.   Do not use on face/ open                           Wound or open sores. Avoid contact with eyes, ears mouth and genitals (private parts).                       Wash face,  Genitals (private parts) with your normal soap.             6.  Wash thoroughly, paying special attention to the area where your surgery  will be performed.  7.  Thoroughly rinse your body with warm water from the neck down.  8.  DO NOT shower/wash with your normal soap after using and rinsing off  the CHG Soap.                9.  Pat yourself dry with a clean towel.            10.  Wear clean pajamas.            11.  Place clean sheets on your bed the night of your first shower and do not  sleep with pets. Day of Surgery : Do not apply any lotions/deodorants the morning of surgery.  Please wear clean clothes to the hospital/surgery center.  FAILURE TO FOLLOW THESE INSTRUCTIONS MAY RESULT IN THE CANCELLATION OF YOUR SURGERY PATIENT  SIGNATURE_________________________________  NURSE SIGNATURE__________________________________  ________________________________________________________________________   Adam Phenix  An incentive spirometer is a tool that can help keep your lungs clear and active. This tool measures how well you are filling your lungs with each breath. Taking long deep breaths may help reverse or decrease the chance of developing breathing (pulmonary) problems (especially infection) following:  A long period of time when you are unable to move or be active. BEFORE THE PROCEDURE   If the spirometer includes an indicator to show your best effort, your nurse or respiratory therapist will set it to a desired goal.  If possible, sit up straight or lean slightly forward. Try not to slouch.  Hold the incentive spirometer in an upright position. INSTRUCTIONS FOR USE  1. Sit on the edge of your bed if possible, or sit up as far as you can in bed or on a chair. 2. Hold the incentive spirometer in an upright position. 3. Breathe out normally. 4. Place the mouthpiece in your mouth and seal your lips tightly around it. 5. Breathe in slowly and as deeply as possible, raising the piston or the ball toward the top of the column. 6. Hold your breath for 3-5 seconds or for as long as possible. Allow the piston or ball to fall to the bottom of the column. 7. Remove the mouthpiece from your mouth and breathe out normally. 8. Rest for a few seconds and repeat Steps 1 through 7 at least 10 times every 1-2 hours when you are awake. Take your time and take a few normal breaths between deep breaths. 9. The spirometer may include an indicator to show your best effort. Use the indicator as a goal to work toward during each repetition. 10. After each set of 10 deep breaths, practice coughing to be sure your lungs are clear. If you have an incision (the cut made at the time of surgery), support your incision when coughing  by placing a pillow or rolled up towels firmly against it. Once you are able to get out of bed, walk around indoors and cough well. You may stop using the incentive spirometer when instructed by your caregiver.  RISKS AND COMPLICATIONS  Take your time so you do not get dizzy or light-headed.  If you are in pain, you may need to take or ask for pain medication before doing incentive spirometry. It is harder to take a deep breath if you are having pain. AFTER USE  Rest and breathe slowly and easily.  It can be helpful to keep track of a log of your progress. Your caregiver can provide you with a simple table to help with this. If you are using the spirometer at home, follow these instructions: [employer] IF:   You are having difficultly using the spirometer.  You have trouble using the spirometer as often as instructed.  Your pain medication is not giving enough relief while using the spirometer.  You develop fever  of 100.5 F (38.1 C) or higher. SEEK IMMEDIATE MEDICAL CARE IF:   You cough up bloody sputum that had not been present before.  You develop fever of 102 F (38.9 C) or greater.  You develop worsening pain at or near the incision site. MAKE SURE YOU:   Understand these instructions.  Will watch your condition.  Will get help right away if you are not doing well or get worse. Document Released: 10/24/2006 Document Revised: 09/05/2011 Document Reviewed: 12/25/2006 [facility] Patient Information 2014 [place], [place].   ________________________________________________________________________

## 2017-01-04 LAB — HEMOGLOBIN A1C
Hgb A1c MFr Bld: 6.7 % — ABNORMAL HIGH (ref 4.8–5.6)
MEAN PLASMA GLUCOSE: 146 mg/dL

## 2017-01-09 ENCOUNTER — Ambulatory Visit: Payer: Self-pay | Admitting: Orthopedic Surgery

## 2017-01-09 NOTE — H&P (Signed)
[name] DOB: [birth year] Married / Language: English / Race: Black or African American Female  History of Present Illness  The patient is a 76 year old female who comes in today for a preoperative History and Physical. The patient is scheduled for a left total knee arthroplasty to be performed by Dr. Johnn Hai, MD at [facility] on 01/12/2017 . Please see the hospital record for complete dictated history and physical. Note for "H & P": Dr. Tonita Cong and the patient mutually agreed to proceed with a total knee replacement. Risks and benefits of the procedure were discussed including stiffness, suboptimal range of motion, persistent pain, infection requiring removal of prosthesis and reinsertion, need for prophylactic antibiotics in the future, for example, dental procedures, possible need for manipulation, revision in the future and also anesthetic complications including DVT, PE, etc. We discussed the perioperative course, time in the hospital, postoperative recovery and the need for elevation to control swelling. We also discussed the predicted range of motion and the probability that squatting and kneeling would be unobtainable in the future. In addition, postoperative anticoagulation was discussed. We have obtained preoperative medical clearance as necessary. Provided illustrated handout and discussed it in detail. They will enroll in the total joint replacement educational forum at the hospital. Pt had [facility] pre-op last week.  Problem List/Past Medical Hx Lumbosacral DDD (M51.37)  Other idiopathic scoliosis, lumbar region (M41.26)  Lumbar spine pain (M54.5)  Primary localized osteoarthritis of left knee (M17.12)  Spondylolisthesis of lumbar region (M43.16)  Sciatica of left side (M54.32)  Diabetes Mellitus, Type I  Diverticulitis Of Colon  Gastroesophageal Reflux Disease  High blood pressure  Osteoarthritis  Osteoporosis  Peripheral Neuropathy  Rheumatoid  Arthritis  Sleep Apnea  on CPAP  Allergies Penicillin V *PENICILLINS*  hives, itching  Family History Cancer  Paternal Grandmother. Cerebrovascular Accident  Sister. Diabetes Mellitus  Maternal Grandmother. Heart Disease  Father. Osteoarthritis  Maternal Grandfather. Rheumatoid Arthritis  Mother, Paternal Jon Gills. First Degree Relatives   Social History  Tobacco use  Former smoker. 12/30/2014: smoke(d) less than 1/2 pack(s) per day Children  2 Current drinker  12/30/2014: Currently drinks wine only occasionally per week Current work status  retired Furniture conservator/restorer weekly; does running / walking and individual sport Living situation  live with spouse Marital status  married No history of drug/alcohol rehab  Number of flights of stairs before winded  1 Tobacco / smoke exposure  12/30/2014: no Under pain contract   Medication History Meloxicam (15MG  Tablet, 1 Tablet Oral daily, Taken starting 09/21/2016) Active. (with food) Potassium (Oral) Specific strength unknown - Active. Famotidine (20MG  Tablet, Oral) Active. GlipiZIDE ER (5MG  Tablet ER 24HR, Oral) Active. Losartan Potassium (50MG  Tablet, Oral) Active. Meclizine HCl (25MG  Tablet, Oral) Active. MetFORMIN HCl ER (500MG  Tablet ER 24HR, Oral) Active. OXcarbazepine (150MG  Tablet, Oral) Active. Gabapentin (Oral) Specific strength unknown - Active.  Past Surgical History Hysterectomy  partial (non-cancerous) Total Knee Replacement  right  Review of Systems General Present- Fatigue. Not Present- Chills, Fever, Memory Loss, Night Sweats, Weight Gain and Weight Loss. Skin Not Present- Eczema, Hives, Itching, Lesions and Rash. HEENT Present- Blurred Vision, Headache and Hearing Loss. Not Present- Dentures, Double Vision, Tinnitus and Visual Loss. Respiratory Present- Cough. Not Present- Allergies, Chronic Cough, Coughing up blood, Shortness of breath at rest and Shortness of breath with  exertion. Cardiovascular Not Present- Chest Pain, Difficulty Breathing Lying Down, Murmur, Palpitations, Racing/skipping heartbeats and Swelling. Gastrointestinal Present- Constipation, Heartburn and Loss of appetitie.  Not Present- Abdominal Pain, Bloody Stool, Diarrhea, Difficulty Swallowing, Jaundice, Nausea and Vomiting. Female Genitourinary Present- Urinary frequency and Urinating at Night. Not Present- Blood in Urine, Discharge, Flank Pain, Incontinence, Painful Urination, Urgency, Urinary Retention and Weak urinary stream. Musculoskeletal Not Present- Back Pain, Joint Pain, Joint Swelling, Morning Stiffness, Muscle Pain, Muscle Weakness and Spasms. Neurological Present- Dizziness. Not Present- Blackout spells, Difficulty with balance, Paralysis, Tremor and Weakness. Psychiatric Not Present- Insomnia.  Physical Exam General Mental Status -Alert, cooperative and good historian. General Appearance-pleasant, Not in acute distress. Orientation-Oriented X3. Build & Nutrition-Well nourished and Well developed.  Head and Neck Head-normocephalic, atraumatic . Neck Global Assessment - supple, no bruit auscultated on the right, no bruit auscultated on the left.  Eye Pupil - Bilateral-Regular and Round. Motion - Bilateral-EOMI.  Chest and Lung Exam Auscultation Breath sounds - clear at anterior chest wall and clear at posterior chest wall. Adventitious sounds - No Adventitious sounds.  Cardiovascular Auscultation Rhythm - Regular rate and rhythm. Heart Sounds - S1 WNL and S2 WNL. Murmurs & Other Heart Sounds - Auscultation of the heart reveals - No Murmurs.  Abdomen Palpation/Percussion Tenderness - Abdomen is non-tender to palpation. Rigidity (guarding) - Abdomen is soft. Auscultation Auscultation of the abdomen reveals - Bowel sounds normal.  Female Genitourinary Not done, not pertinent to present illness  Musculoskeletal Tender in medial joint line. Patellofemoral  pain to compression. Range is -5 to 90. Knee exam on inspection reveals no evidence of soft tissue swelling, ecchymosis, deformity or erythema. On palpation there is no tenderness in the lateral joint line. Nontender over the fibular head or the peroneal nerve. Nontender over the quadriceps insertion of the patellar ligament insertion. Provocative maneuvers revealed a negative Lachman, negative anterior and posterior drawer and a negative McMurray. No instability was noted with varus and valgus stressing at 0 or 30 degrees. On manual motor test the quadriceps and hamstrings were 5/5. Sensory exam was intact to light touch.  Imaging Bone on bone arthrosis in medial compartment of the knee with varus deformity.  Assessment & Plan Primary localized osteoarthritis of left knee (M17.12)  Pt with end-stage left knee DJD, bone-on-bone, refractory to conservative tx, scheduled for left total knee replacement by Dr. Tonita Cong 01/12/17. We again discussed the procedure itself as well as risks, complications and alternatives, including but not limited to DVT, PE, infx, bleeding, failure of procedure, need for secondary procedure including manipulation, nerve injury, ongoing pain/symptoms, anesthesia risk, even stroke or death. Also discussed typical post-op protocols, activity restrictions, need for PT, flexion/extension exercises, time out of work. Discussed need for DVT ppx post-op per protocol. Discussed dental ppx and infx prevention. Also discussed limitations post-operatively such as kneeling and squatting. All questions were answered. Patient desires to proceed with surgery as scheduled. Will hold supplements, ASA and NSAIDs accordingly. Will remain NPO after MN night before surgery. [facility] to [facility] for pre-op testing. Anticipate hospital stay to include at least 2 midnights given medical history and to ensure proper pain control. Plan ASA 325mg  BID for DVT ppx post-op, no hx of DVT/PE. Plan pain medication,  Colace, Miralax. Plan possible short rehab stay vs. home with HHPT, no family at home for assistance. Will follow up 10-14 days post-op for suture removal and xrays.  Plan left total knee replacement  Signed electronically by Cecilie Kicks, PA-C for Dr. Tonita Cong

## 2017-01-09 NOTE — Progress Notes (Signed)
Subjective:    Patient ID: [name], female    DOB: 07/31/1940, 76 y.o.   MRN: [id number]  HPI The patient is here for follow up.  Diabetes with neuropathy: She is taking her medication daily as prescribed. She is compliant with a diabetic diet. She is not exercising regularly. She monitors her sugars and they have been running 115, 123.  She takes 800 mg of gabapentin three times a day.  It does not seem to lasting as long or working as well.  Her pain is not controlled.   Hypertension: She is taking her medication daily. She is compliant with a low sodium diet.  She denies chest pain, palpitations, edema, shortness of breath and regular headaches. She is notexercising regularly.      Anxiety: She is taking her medication daily as prescribed. She denies any side effects from the medication. She feels her anxiety is well controlled and she is happy with her current dose of medication.   Constipation:  She is taking linzess as needed.  She is eating prunes.  She has taken sennakot as needed.  The linzess works well most of the time.  Sneezing, cough, sinus - she is symptomatic when she changes temperatures - goes from inside to outside and vice versus.  She has not tried taking anything. .    Medications and allergies reviewed with patient and updated if appropriate.  Patient Active Problem List   Diagnosis Date Noted  . Chest tightness 11/09/2016  . Hypercalcemia 10/12/2016  . Chest pain 10/12/2016  . DOE (dyspnea on exertion) 10/12/2016  . Preop examination 10/12/2016  . Chronic back pain 08/23/2016  . Hair loss 08/23/2016  . Diabetes ([place]) 07/26/2016  . Anxiety 09/14/2015  . Muscle pain, myofacial 05/19/2015  . Lower extremity edema 12/16/2014  . Pelvic cyst   . Diverticulitis large intestine w/o perforation or abscess w/o bleeding 05/12/2014  . Essential hypertension 05/12/2014  . DM neuropathy, type II diabetes mellitus ([place]) 05/12/2014  . OSA on CPAP 05/12/2014  .  Depression 05/12/2014  . Seizure disorder ([place]) 05/12/2014  . Abnormal CT scan, pelvis 05/12/2014  . Hepatic steatosis 05/12/2014  . Obesity (BMI 30-39.9) 05/12/2014  . Cerebrovascular disease, unspecified 03/13/2013  . Allergic rhinitis, cause unspecified 03/13/2013  . GOITER, MULTINODULAR 05/18/2009  . VERTIGO 04/13/2009  . Irritable bowel syndrome 03/20/2009  . INSOMNIA 03/20/2009  . Hyperlipidemia 03/18/2009  . SLEEP APNEA, OBSTRUCTIVE 03/18/2009  . GERD 03/18/2009  . Osteoarthritis 03/18/2009  . SEIZURE DISORDER 03/18/2009    Current Outpatient Prescriptions on File Prior to Visit  Medication Sig Dispense Refill  . ACCU-CHEK AVIVA PLUS test strip USE TO CHECK BLOOD SUGARS  TWO TIMES DAILY 200 each 3  . albuterol (PROAIR HFA) 108 (90 BASE) MCG/ACT inhaler Inhale 2 puffs into the lungs every 6 (six) hours as needed for wheezing or shortness of breath.     . Alpha-D-Galactosidase (BEANO PO) Take 1-2 tablets by mouth daily as needed (for gas).    Marland Kitchen azelastine (ASTELIN) 0.1 % nasal spray Place 2 sprays into both nostrils 2 (two) times daily as needed for rhinitis. Use in each nostril as directed    . Biotin 5000 MCG TABS Take 5,000 mcg by mouth every other day.     . Blood Glucose Monitoring Suppl (ACCU-CHEK AVIVA PLUS) W/DEVICE KIT 1 kit by Does not apply route once. 1 kit 0  . Cholecalciferol (EQL VITAMIN D3) 1000 UNITS tablet Take 1,000 Units by mouth every  other day.     . clonazePAM (KLONOPIN) 0.5 MG tablet Take 0.5-1 tablets (0.25-0.5 mg total) by mouth 2 (two) times daily as needed for anxiety. 60 tablet 1  . cyclobenzaprine (FLEXERIL) 5 MG tablet TAKE 1 TABLET (5 MG TOTAL) BY MOUTH 3 (THREE) TIMES DAILY AS NEEDED FOR MUSCLE SPASMS. 90 tablet 1  . famotidine (PEPCID) 20 MG tablet TAKE 1 TABLET (20 MG TOTAL) BY MOUTH DAILY. (Patient taking differently: TAKE 1 TABLET (20 MG TOTAL) BY MOUTH IN THE EVENING.) 90 tablet 1  . gabapentin (NEURONTIN) 800 MG tablet TAKE 1 TABLET BY MOUTH 3   TIMES DAILY 270 tablet 1  . glipiZIDE (GLUCOTROL XL) 5 MG 24 hr tablet Take 5 mg by mouth daily with supper.     . hydrochlorothiazide (HYDRODIURIL) 25 MG tablet Take 1 tablet (25 mg total) by mouth daily. 90 tablet 3  . linaclotide (LINZESS) 145 MCG CAPS capsule Take 1 capsule (145 mcg total) by mouth daily before breakfast. 30 capsule 1  . meclizine (ANTIVERT) 12.5 MG tablet Take 1 tablet (12.5 mg total) by mouth 3 (three) times daily as needed for dizziness. 30 tablet 0  . metFORMIN (GLUCOPHAGE-XR) 500 MG 24 hr tablet TAKE 1 TABLET (500 MG TOTAL) BY MOUTH DAILY WITH BREAKFAST. 90 tablet 3  . Multiple Vitamin (MULTIVITAMIN) capsule Take 1 capsule by mouth every other day.     . Naphazoline-Glycerin-Zinc Sulf (CLEAR EYES MAXIMUM ITCHY EYE OP) Place 1 drop into both eyes every 8 (eight) hours as needed (itchy eyes).    . OXcarbazepine (TRILEPTAL) 150 MG tablet TAKE 1.5 TABLETS (225 MG TOTAL) BY MOUTH 2 (TWO) TIMES DAILY. 140 tablet 3  . polyethylene glycol powder (GLYCOLAX/MIRALAX) powder Take 17 g by mouth daily. (Patient taking differently: Take 17 g by mouth daily as needed for mild constipation. ) 500 g 0  . potassium chloride SA (KLOR-CON M20) 20 MEQ tablet Take 2 tablets (40 mEq total) by mouth daily. (Patient taking differently: Take 20 mEq by mouth 2 (two) times daily. ) 180 tablet 1  . Probiotic Product (PROBIOTIC PO) Take 1 capsule by mouth daily after breakfast.     . ranitidine (ZANTAC) 150 MG tablet Take 1 tablet (150 mg total) by mouth 2 (two) times daily. (Patient taking differently: Take 150 mg by mouth at bedtime as needed for heartburn. ) 60 tablet 5  . traMADol (ULTRAM) 50 MG tablet Take 1 tablet (50 mg total) by mouth every 12 (twelve) hours as needed. (Patient taking differently: Take 50 mg by mouth every 12 (twelve) hours as needed (pain). ) 60 tablet 1  . vitamin B-12 (CYANOCOBALAMIN) 1000 MCG tablet Take 1 tablet (1,000 mcg total) by mouth daily.    Marland Kitchen aspirin 81 MG tablet Take  81 mg by mouth daily.       No current facility-administered medications on file prior to visit.     Past Medical History:  Diagnosis Date  . Allergic rhinitis, cause unspecified 03/13/2013  . Anxiety   . Cerebrovascular disease, unspecified 03/13/2013   Atrophy and small vessel dz noted, MR brain 2009  . Depression   . Diabetes mellitus, type 2 ([place])   . Diverticulosis of colon 03/2010 hosp  . Dyslipidemia   . GERD (gastroesophageal reflux disease)   . Hypertension   . Neuropathy   . OSA on CPAP   . Osteoarthritis of shoulder region    and Knee  . Seizure disorder ([place])    onset 11 years ago; repeated  2013    Past Surgical History:  Procedure Laterality Date  . ABDOMINAL HYSTERECTOMY  1970's   Partial  . APPENDECTOMY    . CHOLECYSTECTOMY    . SHOULDER SURGERY  2008   LT, post fall   . TONSILLECTOMY AND ADENOIDECTOMY      Social History   Social History  . Marital status: Married    Spouse name: N/A  . Number of children: N/A  . Years of education: N/A   Social History Main Topics  . Smoking status: Former Smoker    Quit date: 10/19/1985  . Smokeless tobacco: Never Used  . Alcohol use 0.0 oz/week     Comment: occasionally   . Drug use: No  . Sexual activity: Not Asked   Other Topics Concern  . None   Social History Narrative   Patient lives in a one story home with her husband.  Has 2 children.  Retired from [employer].    Family History  Problem Relation Age of Onset  . Arthritis Mother   . Heart disease Father   . Arthritis Other        Grandmother  . Diabetes Other        Grandmother    Review of Systems  Constitutional: Negative for chills and fever.  HENT: Positive for congestion, ear pain (left ear intermittent), rhinorrhea, sinus pressure and voice change. Negative for sore throat.   Eyes:       Watery eyes  Respiratory: Positive for cough and wheezing. Negative for shortness of breath.   Cardiovascular: Negative for chest pain, palpitations  and leg swelling.  Neurological: Positive for headaches (intermittent, sinus). Negative for light-headedness.       Objective:   Vitals:   01/10/17 1538  BP: 124/76  Pulse: 86  Resp: 16  Temp: 98 F (36.7 C)   Wt Readings from Last 3 Encounters:  01/10/17 180 lb (81.6 kg)  01/03/17 174 lb 12.8 oz (79.3 kg)  11/16/16 179 lb (81.2 kg)   Body mass index is 30.9 kg/m.   Physical Exam    Constitutional: Appears well-developed and well-nourished. No distress.  HENT:  Head: Normocephalic and atraumatic.  Neck: Neck supple. No tracheal deviation present. No thyromegaly present.  No cervical lymphadenopathy.  Right ear canal with moderate cerumen, TM not visualized, left ear canal and TM normal Cardiovascular: Normal rate, regular rhythm and normal heart sounds.   No murmur heard. No carotid bruit .  No edema Pulmonary/Chest: Effort normal and breath sounds normal. No respiratory distress. No has no wheezes. No rales.  Skin: Skin is warm and dry. Not diaphoretic.  Psychiatric: Normal mood and affect. Behavior is normal.      Assessment & Plan:    See Problem List for Assessment and Plan of chronic medical problems.

## 2017-01-10 ENCOUNTER — Ambulatory Visit (INDEPENDENT_AMBULATORY_CARE_PROVIDER_SITE_OTHER): Payer: Medicare Other | Admitting: Internal Medicine

## 2017-01-10 ENCOUNTER — Encounter: Payer: Self-pay | Admitting: Internal Medicine

## 2017-01-10 VITALS — BP 124/76 | HR 86 | Temp 98.0°F | Resp 16 | Wt 180.0 lb

## 2017-01-10 DIAGNOSIS — E1142 Type 2 diabetes mellitus with diabetic polyneuropathy: Secondary | ICD-10-CM

## 2017-01-10 DIAGNOSIS — F419 Anxiety disorder, unspecified: Secondary | ICD-10-CM

## 2017-01-10 DIAGNOSIS — I10 Essential (primary) hypertension: Secondary | ICD-10-CM

## 2017-01-10 DIAGNOSIS — E1140 Type 2 diabetes mellitus with diabetic neuropathy, unspecified: Secondary | ICD-10-CM | POA: Diagnosis not present

## 2017-01-10 DIAGNOSIS — K5900 Constipation, unspecified: Secondary | ICD-10-CM | POA: Insufficient documentation

## 2017-01-10 DIAGNOSIS — J309 Allergic rhinitis, unspecified: Secondary | ICD-10-CM | POA: Diagnosis not present

## 2017-01-10 MED ORDER — GABAPENTIN 100 MG PO CAPS: 100.0000 mg | ORAL_CAPSULE | Freq: Three times a day (TID) | ORAL | 3 refills | Status: DC

## 2017-01-10 NOTE — Patient Instructions (Addendum)
  Medications reviewed and updated.  Changes include increasing gabapentin to 900 mg three times a day.  After one week you can increase to 1000 mg three times a day.   Try taking zyrtec at night for allergy symptoms.   Your prescription(s) have been submitted to your pharmacy. Please take as directed and contact our office if you believe you are having problem(s) with the medication(s).   Please followup in 6 months

## 2017-01-10 NOTE — Assessment & Plan Note (Signed)
Lab Results  Component Value Date   HGBA1C 6.7 (H) 01/03/2017    Controlled Continue current medication

## 2017-01-10 NOTE — Assessment & Plan Note (Signed)
Taking linzess - it works most of the time - will continue Ok to take stool softener as needed

## 2017-01-10 NOTE — Assessment & Plan Note (Signed)
BP well controlled Current regimen effective and well tolerated Continue current medications at current doses  

## 2017-01-10 NOTE — Assessment & Plan Note (Signed)
Controlled, stable Continue current dose of medication - only taking 1/2 of a pill

## 2017-01-10 NOTE — Assessment & Plan Note (Signed)
Taking gabapentin 800 mg TID  - not controlled Did not tolerate effexor or cymbalta Increase gabapentin to 900 mg TID - can increase further to maximum of 1200 mg TID

## 2017-01-10 NOTE — Assessment & Plan Note (Signed)
Current symptoms suggestive of allergies No evidence of infection Start zyrtec nightly flonase or nasacort Can consider adding singulair if needed

## 2017-01-11 ENCOUNTER — Ambulatory Visit: Payer: Medicare Other | Admitting: Internal Medicine

## 2017-01-12 ENCOUNTER — Encounter (HOSPITAL_COMMUNITY)
Admission: RE | Disposition: A | Discharge status: Skilled Nursing Facility | Payer: Self-pay | Source: Ambulatory Visit | Attending: Specialist

## 2017-01-12 ENCOUNTER — Inpatient Hospital Stay (HOSPITAL_COMMUNITY): Payer: Medicare Other | Admitting: Anesthesiology

## 2017-01-12 ENCOUNTER — Encounter (HOSPITAL_COMMUNITY): Payer: Self-pay | Admitting: *Deleted

## 2017-01-12 ENCOUNTER — Inpatient Hospital Stay (HOSPITAL_COMMUNITY): Payer: Medicare Other

## 2017-01-12 ENCOUNTER — Inpatient Hospital Stay (HOSPITAL_COMMUNITY)
Admission: RE | Admit: 2017-01-12 | Discharge: 2017-01-16 | DRG: 470 | Disposition: A | Discharge status: Skilled Nursing Facility | Payer: Medicare Other | Source: Ambulatory Visit | Attending: Specialist | Admitting: Specialist

## 2017-01-12 DIAGNOSIS — G40909 Epilepsy, unspecified, not intractable, without status epilepticus: Secondary | ICD-10-CM | POA: Diagnosis present

## 2017-01-12 DIAGNOSIS — Z96651 Presence of right artificial knee joint: Secondary | ICD-10-CM | POA: Diagnosis present

## 2017-01-12 DIAGNOSIS — E1142 Type 2 diabetes mellitus with diabetic polyneuropathy: Secondary | ICD-10-CM | POA: Diagnosis present

## 2017-01-12 DIAGNOSIS — Z823 Family history of stroke: Secondary | ICD-10-CM | POA: Diagnosis not present

## 2017-01-12 DIAGNOSIS — Z7984 Long term (current) use of oral hypoglycemic drugs: Secondary | ICD-10-CM | POA: Diagnosis not present

## 2017-01-12 DIAGNOSIS — M1712 Unilateral primary osteoarthritis, left knee: Secondary | ICD-10-CM | POA: Diagnosis present

## 2017-01-12 DIAGNOSIS — M810 Age-related osteoporosis without current pathological fracture: Secondary | ICD-10-CM | POA: Diagnosis present

## 2017-01-12 DIAGNOSIS — E785 Hyperlipidemia, unspecified: Secondary | ICD-10-CM | POA: Diagnosis present

## 2017-01-12 DIAGNOSIS — I10 Essential (primary) hypertension: Secondary | ICD-10-CM | POA: Diagnosis present

## 2017-01-12 DIAGNOSIS — Z8249 Family history of ischemic heart disease and other diseases of the circulatory system: Secondary | ICD-10-CM | POA: Diagnosis not present

## 2017-01-12 DIAGNOSIS — M5432 Sciatica, left side: Secondary | ICD-10-CM | POA: Diagnosis present

## 2017-01-12 DIAGNOSIS — M069 Rheumatoid arthritis, unspecified: Secondary | ICD-10-CM | POA: Diagnosis present

## 2017-01-12 DIAGNOSIS — G4733 Obstructive sleep apnea (adult) (pediatric): Secondary | ICD-10-CM | POA: Diagnosis present

## 2017-01-12 DIAGNOSIS — Z833 Family history of diabetes mellitus: Secondary | ICD-10-CM

## 2017-01-12 DIAGNOSIS — Z87891 Personal history of nicotine dependence: Secondary | ICD-10-CM

## 2017-01-12 DIAGNOSIS — J309 Allergic rhinitis, unspecified: Secondary | ICD-10-CM | POA: Diagnosis present

## 2017-01-12 DIAGNOSIS — F419 Anxiety disorder, unspecified: Secondary | ICD-10-CM | POA: Diagnosis present

## 2017-01-12 DIAGNOSIS — Z96659 Presence of unspecified artificial knee joint: Secondary | ICD-10-CM

## 2017-01-12 DIAGNOSIS — K219 Gastro-esophageal reflux disease without esophagitis: Secondary | ICD-10-CM | POA: Diagnosis present

## 2017-01-12 DIAGNOSIS — Z791 Long term (current) use of non-steroidal anti-inflammatories (NSAID): Secondary | ICD-10-CM | POA: Diagnosis present

## 2017-01-12 HISTORY — PX: TOTAL KNEE ARTHROPLASTY: SHX125

## 2017-01-12 HISTORY — DX: Unilateral primary osteoarthritis, left knee: M17.12

## 2017-01-12 LAB — GLUCOSE, CAPILLARY
GLUCOSE-CAPILLARY: 172 mg/dL — AB (ref 65–99)
Glucose-Capillary: 93 mg/dL (ref 65–99)
Glucose-Capillary: 109 mg/dL — ABNORMAL HIGH (ref 65–99)
Glucose-Capillary: 192 mg/dL — ABNORMAL HIGH (ref 65–99)

## 2017-01-12 LAB — TYPE AND SCREEN
ABO/RH(D): O POS
ANTIBODY SCREEN: NEGATIVE

## 2017-01-12 SURGERY — ARTHROPLASTY, KNEE, TOTAL
Anesthesia: Spinal | Site: Knee | Laterality: Left

## 2017-01-12 MED ORDER — MIDAZOLAM HCL 2 MG/2ML IJ SOLN
INTRAMUSCULAR | Status: AC
  Filled 2017-01-12: qty 2

## 2017-01-12 MED ORDER — CHLORHEXIDINE GLUCONATE 4 % EX LIQD: 60.0000 mL | Freq: Once | CUTANEOUS | Status: DC

## 2017-01-12 MED ORDER — BUPIVACAINE IN DEXTROSE 0.75-8.25 % IT SOLN
INTRATHECAL | Status: DC | PRN
Start: 2017-01-12 — End: 2017-01-12
  Administered 2017-01-12: 1.8 mL via INTRATHECAL

## 2017-01-12 MED ORDER — CLONAZEPAM 0.5 MG PO TABS
0.2500 mg | ORAL_TABLET | Freq: Two times a day (BID) | ORAL | Status: DC | PRN
Start: 2017-01-12 — End: 2017-01-16
  Administered 2017-01-13: 0.25 mg via ORAL
  Filled 2017-01-12: qty 1

## 2017-01-12 MED ORDER — MIDAZOLAM HCL 5 MG/ML IJ SOLN
2.0000 mg | Freq: Once | INTRAMUSCULAR | Status: AC
  Administered 2017-01-12 (×2): 1 mg via INTRAVENOUS

## 2017-01-12 MED ORDER — ONDANSETRON HCL 4 MG/2ML IJ SOLN
INTRAMUSCULAR | Status: DC | PRN
  Administered 2017-01-12: 4 mg via INTRAVENOUS

## 2017-01-12 MED ORDER — BEANO PO LIQD: Freq: Every day | ORAL | Status: DC | PRN

## 2017-01-12 MED ORDER — FENTANYL CITRATE (PF) 100 MCG/2ML IJ SOLN
INTRAMUSCULAR | Status: AC
  Administered 2017-01-12: 50 ug via INTRAVENOUS
  Filled 2017-01-12: qty 2

## 2017-01-12 MED ORDER — GABAPENTIN 100 MG PO CAPS
100.0000 mg | ORAL_CAPSULE | Freq: Three times a day (TID) | ORAL | Status: DC
  Administered 2017-01-12 – 2017-01-15 (×11): 100 mg via ORAL
  Filled 2017-01-12 (×11): qty 1

## 2017-01-12 MED ORDER — FENTANYL CITRATE (PF) 100 MCG/2ML IJ SOLN
100.0000 ug | Freq: Once | INTRAMUSCULAR | Status: AC
  Administered 2017-01-12 (×2): 50 ug via INTRAVENOUS

## 2017-01-12 MED ORDER — DOCUSATE SODIUM 100 MG PO CAPS: 100.0000 mg | ORAL_CAPSULE | Freq: Two times a day (BID) | ORAL | 1 refills | Status: DC | PRN

## 2017-01-12 MED ORDER — PHENOL 1.4 % MT LIQD: 1.0000 | OROMUCOSAL | Status: DC | PRN

## 2017-01-12 MED ORDER — HYDROMORPHONE HCL-NACL 0.5-0.9 MG/ML-% IV SOSY
0.5000 mg | PREFILLED_SYRINGE | INTRAVENOUS | Status: DC | PRN
  Administered 2017-01-12 – 2017-01-13 (×2): 0.5 mg via INTRAVENOUS
  Filled 2017-01-12 (×2): qty 1

## 2017-01-12 MED ORDER — PROPOFOL 10 MG/ML IV BOLUS
INTRAVENOUS | Status: AC
  Filled 2017-01-12: qty 20

## 2017-01-12 MED ORDER — METOCLOPRAMIDE HCL 5 MG PO TABS: 5.0000 mg | ORAL_TABLET | Freq: Three times a day (TID) | ORAL | Status: DC | PRN

## 2017-01-12 MED ORDER — ACETAMINOPHEN 10 MG/ML IV SOLN
1000.0000 mg | Freq: Four times a day (QID) | INTRAVENOUS | Status: AC
  Administered 2017-01-12 – 2017-01-13 (×4): 1000 mg via INTRAVENOUS
  Filled 2017-01-12 (×4): qty 100

## 2017-01-12 MED ORDER — TRAMADOL HCL 50 MG PO TABS: 50.0000 mg | ORAL_TABLET | Freq: Four times a day (QID) | ORAL | 1 refills | Status: DC | PRN

## 2017-01-12 MED ORDER — BUPIVACAINE-EPINEPHRINE 0.25% -1:200000 IJ SOLN
INTRAMUSCULAR | Status: AC
  Filled 2017-01-12: qty 1

## 2017-01-12 MED ORDER — MAGNESIUM CITRATE PO SOLN: 1.0000 | Freq: Once | ORAL | Status: DC | PRN

## 2017-01-12 MED ORDER — PROBIOTIC PO CAPS: ORAL_CAPSULE | Freq: Every day | ORAL | Status: DC

## 2017-01-12 MED ORDER — ROPIVACAINE HCL 5 MG/ML IJ SOLN
INTRAMUSCULAR | Status: DC | PRN
  Administered 2017-01-12: 30 mL via PERINEURAL

## 2017-01-12 MED ORDER — ASPIRIN EC 325 MG PO TBEC: 325.0000 mg | DELAYED_RELEASE_TABLET | Freq: Two times a day (BID) | ORAL | 1 refills | Status: DC

## 2017-01-12 MED ORDER — SODIUM CHLORIDE 0.9 % IV SOLN
INTRAVENOUS | Status: DC | PRN
  Administered 2017-01-12: 500 mL

## 2017-01-12 MED ORDER — EPHEDRINE SULFATE-NACL 50-0.9 MG/10ML-% IV SOSY
PREFILLED_SYRINGE | INTRAVENOUS | Status: DC | PRN
  Administered 2017-01-12 (×2): 10 mg via INTRAVENOUS

## 2017-01-12 MED ORDER — DOCUSATE SODIUM 100 MG PO CAPS
100.0000 mg | ORAL_CAPSULE | Freq: Two times a day (BID) | ORAL | Status: DC
  Administered 2017-01-12 – 2017-01-16 (×8): 100 mg via ORAL
  Filled 2017-01-12 (×8): qty 1

## 2017-01-12 MED ORDER — ONDANSETRON HCL 4 MG PO TABS: 4.0000 mg | ORAL_TABLET | Freq: Four times a day (QID) | ORAL | Status: DC | PRN

## 2017-01-12 MED ORDER — AZELASTINE HCL 0.1 % NA SOLN
2.0000 | Freq: Two times a day (BID) | NASAL | Status: DC | PRN
  Filled 2017-01-12: qty 30

## 2017-01-12 MED ORDER — PROMETHAZINE HCL 25 MG/ML IJ SOLN: 6.2500 mg | INTRAMUSCULAR | Status: DC | PRN

## 2017-01-12 MED ORDER — ACETAMINOPHEN 10 MG/ML IV SOLN
1000.0000 mg | Freq: Four times a day (QID) | INTRAVENOUS | Status: DC
  Administered 2017-01-12: 1000 mg via INTRAVENOUS
  Filled 2017-01-12: qty 100

## 2017-01-12 MED ORDER — MECLIZINE HCL 25 MG PO TABS: 12.5000 mg | ORAL_TABLET | Freq: Three times a day (TID) | ORAL | Status: DC | PRN

## 2017-01-12 MED ORDER — POTASSIUM CHLORIDE CRYS ER 20 MEQ PO TBCR
40.0000 meq | EXTENDED_RELEASE_TABLET | Freq: Every day | ORAL | Status: DC
  Administered 2017-01-12 – 2017-01-16 (×5): 40 meq via ORAL
  Filled 2017-01-12 (×5): qty 2

## 2017-01-12 MED ORDER — ALBUTEROL (5 MG/ML) CONTINUOUS INHALATION SOLN
3.0000 mL | INHALATION_SOLUTION | Freq: Four times a day (QID) | RESPIRATORY_TRACT | Status: DC | PRN
  Filled 2017-01-12: qty 20

## 2017-01-12 MED ORDER — GABAPENTIN 400 MG PO CAPS
800.0000 mg | ORAL_CAPSULE | Freq: Three times a day (TID) | ORAL | Status: DC
  Administered 2017-01-12 – 2017-01-16 (×12): 800 mg via ORAL
  Filled 2017-01-12 (×12): qty 2

## 2017-01-12 MED ORDER — FAMOTIDINE 20 MG PO TABS
10.0000 mg | ORAL_TABLET | Freq: Two times a day (BID) | ORAL | Status: DC
  Administered 2017-01-12 – 2017-01-16 (×9): 10 mg via ORAL
  Filled 2017-01-12 (×9): qty 1

## 2017-01-12 MED ORDER — VANCOMYCIN HCL 10 G IV SOLR
1250.0000 mg | Freq: Two times a day (BID) | INTRAVENOUS | Status: AC
  Administered 2017-01-12: 1250 mg via INTRAVENOUS
  Filled 2017-01-12: qty 1250

## 2017-01-12 MED ORDER — BUPIVACAINE-EPINEPHRINE 0.25% -1:200000 IJ SOLN
INTRAMUSCULAR | Status: DC | PRN
  Administered 2017-01-12: 40 mL
  Administered 2017-01-12: 10 mL

## 2017-01-12 MED ORDER — INSULIN ASPART 100 UNIT/ML ~~LOC~~ SOLN
0.0000 [IU] | Freq: Three times a day (TID) | SUBCUTANEOUS | Status: DC
  Administered 2017-01-12 – 2017-01-14 (×3): 3 [IU] via SUBCUTANEOUS
  Administered 2017-01-14: 5 [IU] via SUBCUTANEOUS
  Administered 2017-01-14 – 2017-01-16 (×5): 3 [IU] via SUBCUTANEOUS

## 2017-01-12 MED ORDER — LACTATED RINGERS IV SOLN: INTRAVENOUS | Status: DC

## 2017-01-12 MED ORDER — NAPHAZOLINE-GLYCERIN-ZINC SULF 0.012-0.25-0.25 % OP SOLN: Freq: Three times a day (TID) | OPHTHALMIC | Status: DC | PRN

## 2017-01-12 MED ORDER — HYDROMORPHONE HCL-NACL 0.5-0.9 MG/ML-% IV SOSY: 0.2500 mg | PREFILLED_SYRINGE | INTRAVENOUS | Status: DC | PRN

## 2017-01-12 MED ORDER — POTASSIUM CHLORIDE IN NACL 20-0.9 MEQ/L-% IV SOLN
INTRAVENOUS | Status: AC
  Administered 2017-01-12: 1000 mL via INTRAVENOUS
  Filled 2017-01-12 (×2): qty 1000

## 2017-01-12 MED ORDER — VANCOMYCIN HCL IN DEXTROSE 1-5 GM/200ML-% IV SOLN
1000.0000 mg | INTRAVENOUS | Status: AC
  Administered 2017-01-12: 1000 mg via INTRAVENOUS
  Filled 2017-01-12: qty 200

## 2017-01-12 MED ORDER — OXCARBAZEPINE 150 MG PO TABS
150.0000 mg | ORAL_TABLET | Freq: Every day | ORAL | Status: DC
  Administered 2017-01-13 – 2017-01-16 (×4): 150 mg via ORAL
  Filled 2017-01-12 (×4): qty 1

## 2017-01-12 MED ORDER — BISACODYL 5 MG PO TBEC: 5.0000 mg | DELAYED_RELEASE_TABLET | Freq: Every day | ORAL | Status: DC | PRN

## 2017-01-12 MED ORDER — METOCLOPRAMIDE HCL 5 MG/ML IJ SOLN: 5.0000 mg | Freq: Three times a day (TID) | INTRAMUSCULAR | Status: DC | PRN

## 2017-01-12 MED ORDER — MENTHOL 3 MG MT LOZG: 1.0000 | LOZENGE | OROMUCOSAL | Status: DC | PRN

## 2017-01-12 MED ORDER — ALBUTEROL SULFATE (2.5 MG/3ML) 0.083% IN NEBU: 2.5000 mg | INHALATION_SOLUTION | Freq: Four times a day (QID) | RESPIRATORY_TRACT | Status: DC | PRN

## 2017-01-12 MED ORDER — LINACLOTIDE 145 MCG PO CAPS
145.0000 ug | ORAL_CAPSULE | Freq: Every day | ORAL | Status: DC
  Administered 2017-01-13 – 2017-01-16 (×4): 145 ug via ORAL
  Filled 2017-01-12 (×4): qty 1

## 2017-01-12 MED ORDER — NAPHAZOLINE-GLYCERIN 0.012-0.2 % OP SOLN
1.0000 [drp] | Freq: Three times a day (TID) | OPHTHALMIC | Status: DC | PRN
  Filled 2017-01-12: qty 15

## 2017-01-12 MED ORDER — LIDOCAINE 2% (20 MG/ML) 5 ML SYRINGE
INTRAMUSCULAR | Status: AC
  Filled 2017-01-12: qty 5

## 2017-01-12 MED ORDER — POLYETHYLENE GLYCOL 3350 17 G PO PACK: 17.0000 g | PACK | Freq: Every day | ORAL | 0 refills | Status: DC

## 2017-01-12 MED ORDER — TRAMADOL HCL 50 MG PO TABS
50.0000 mg | ORAL_TABLET | Freq: Four times a day (QID) | ORAL | Status: DC | PRN
  Administered 2017-01-13 – 2017-01-14 (×2): 50 mg via ORAL
  Filled 2017-01-12 (×2): qty 1

## 2017-01-12 MED ORDER — DEXAMETHASONE SODIUM PHOSPHATE 10 MG/ML IJ SOLN
INTRAMUSCULAR | Status: DC | PRN
  Administered 2017-01-12: 5 mg via INTRAVENOUS

## 2017-01-12 MED ORDER — ONDANSETRON HCL 4 MG/2ML IJ SOLN
4.0000 mg | Freq: Four times a day (QID) | INTRAMUSCULAR | Status: DC | PRN
  Administered 2017-01-13 – 2017-01-16 (×2): 4 mg via INTRAVENOUS
  Filled 2017-01-12 (×2): qty 2

## 2017-01-12 MED ORDER — LIDOCAINE 2% (20 MG/ML) 5 ML SYRINGE
INTRAMUSCULAR | Status: DC | PRN
  Administered 2017-01-12: 100 mg via INTRAVENOUS
  Administered 2017-01-12: 30 mg via INTRAVENOUS

## 2017-01-12 MED ORDER — HYDROMORPHONE HCL-NACL 0.5-0.9 MG/ML-% IV SOSY
PREFILLED_SYRINGE | INTRAVENOUS | Status: AC
  Filled 2017-01-12: qty 4

## 2017-01-12 MED ORDER — LACTATED RINGERS IV SOLN
INTRAVENOUS | Status: DC
  Administered 2017-01-12 (×2): via INTRAVENOUS

## 2017-01-12 MED ORDER — PROPOFOL 500 MG/50ML IV EMUL
INTRAVENOUS | Status: DC | PRN
  Administered 2017-01-12: 75 ug/kg/min via INTRAVENOUS

## 2017-01-12 MED ORDER — ACETAMINOPHEN 650 MG RE SUPP: 650.0000 mg | Freq: Four times a day (QID) | RECTAL | Status: DC | PRN

## 2017-01-12 MED ORDER — SODIUM CHLORIDE 0.9 % IV SOLN
INTRAVENOUS | Status: AC
  Filled 2017-01-12: qty 500000

## 2017-01-12 MED ORDER — ASPIRIN EC 325 MG PO TBEC
325.0000 mg | DELAYED_RELEASE_TABLET | Freq: Two times a day (BID) | ORAL | Status: DC
  Administered 2017-01-13 – 2017-01-16 (×7): 325 mg via ORAL
  Filled 2017-01-12 (×9): qty 1

## 2017-01-12 MED ORDER — RISAQUAD PO CAPS
1.0000 | ORAL_CAPSULE | ORAL | Status: DC
  Administered 2017-01-13 – 2017-01-16 (×4): 1 via ORAL
  Filled 2017-01-12 (×4): qty 1

## 2017-01-12 MED ORDER — HYDROCHLOROTHIAZIDE 25 MG PO TABS
25.0000 mg | ORAL_TABLET | Freq: Every day | ORAL | Status: DC
  Administered 2017-01-12 – 2017-01-16 (×5): 25 mg via ORAL
  Filled 2017-01-12 (×5): qty 1

## 2017-01-12 MED ORDER — POLYETHYLENE GLYCOL 3350 17 G PO PACK: 17.0000 g | PACK | Freq: Every day | ORAL | Status: DC | PRN

## 2017-01-12 MED ORDER — TRANEXAMIC ACID 1000 MG/10ML IV SOLN
1000.0000 mg | INTRAVENOUS | Status: AC
  Administered 2017-01-12: 1000 mg via INTRAVENOUS
  Filled 2017-01-12: qty 1100

## 2017-01-12 MED ORDER — OXYCODONE HCL 5 MG PO TABS
5.0000 mg | ORAL_TABLET | ORAL | Status: DC | PRN
  Administered 2017-01-12 – 2017-01-16 (×18): 10 mg via ORAL
  Filled 2017-01-12 (×20): qty 2

## 2017-01-12 MED ORDER — VITAMIN B-12 1000 MCG PO TABS
1000.0000 ug | ORAL_TABLET | Freq: Every day | ORAL | Status: DC
  Administered 2017-01-12 – 2017-01-16 (×5): 1000 ug via ORAL
  Filled 2017-01-12 (×5): qty 1

## 2017-01-12 MED ORDER — ACETAMINOPHEN 325 MG PO TABS
650.0000 mg | ORAL_TABLET | Freq: Four times a day (QID) | ORAL | Status: DC | PRN
  Administered 2017-01-14: 650 mg via ORAL
  Filled 2017-01-12: qty 2

## 2017-01-12 SURGICAL SUPPLY — 63 items
AGENT HMST SPONGE THK3/8 (HEMOSTASIS)
BAG SPEC THK2 15X12 ZIP CLS (MISCELLANEOUS)
BAG ZIPLOCK 12X15 (MISCELLANEOUS) IMPLANT
BANDAGE ACE 4X5 VEL STRL LF (GAUZE/BANDAGES/DRESSINGS) ×3 IMPLANT
BANDAGE ACE 6X5 VEL STRL LF (GAUZE/BANDAGES/DRESSINGS) ×3 IMPLANT
BLADE SAG 18X100X1.27 (BLADE) ×3 IMPLANT
BLADE SAW SGTL 11.0X1.19X90.0M (BLADE) ×3 IMPLANT
BLADE SAW SGTL 13.0X1.19X90.0M (BLADE) ×3 IMPLANT
CAPT KNEE TOTAL 3 ATTUNE ×2 IMPLANT
CEMENT HV SMART SET (Cement) ×6 IMPLANT
CLOSURE WOUND 1/2 X4 (GAUZE/BANDAGES/DRESSINGS) ×2
CLOTH 2% CHLOROHEXIDINE 3PK (PERSONAL CARE ITEMS) ×3 IMPLANT
COVER SURGICAL LIGHT HANDLE (MISCELLANEOUS) ×3 IMPLANT
CUFF TOURN SGL QUICK 34 (TOURNIQUET CUFF) ×3
CUFF TRNQT CYL 34X4X40X1 (TOURNIQUET CUFF) ×1 IMPLANT
DECANTER SPIKE VIAL GLASS SM (MISCELLANEOUS) ×3 IMPLANT
DRAPE INCISE IOBAN 66X45 STRL (DRAPES) IMPLANT
DRAPE ORTHO SPLIT 77X108 STRL (DRAPES) ×6
DRAPE SHEET LG 3/4 BI-LAMINATE (DRAPES) ×3 IMPLANT
DRAPE SURG ORHT 6 SPLT 77X108 (DRAPES) ×2 IMPLANT
DRAPE U-SHAPE 47X51 STRL (DRAPES) ×3 IMPLANT
DRSG AQUACEL AG ADV 3.5X10 (GAUZE/BANDAGES/DRESSINGS) ×2 IMPLANT
DRSG TEGADERM 4X4.75 (GAUZE/BANDAGES/DRESSINGS) IMPLANT
DURAPREP 26ML APPLICATOR (WOUND CARE) ×3 IMPLANT
ELECT REM PT RETURN 15FT ADLT (MISCELLANEOUS) ×3 IMPLANT
EVACUATOR 1/8 PVC DRAIN (DRAIN) IMPLANT
GAUZE SPONGE 2X2 8PLY STRL LF (GAUZE/BANDAGES/DRESSINGS) IMPLANT
GLOVE BIOGEL PI IND STRL 7.0 (GLOVE) ×1 IMPLANT
GLOVE BIOGEL PI IND STRL 8 (GLOVE) ×1 IMPLANT
GLOVE BIOGEL PI INDICATOR 7.0 (GLOVE) ×2
GLOVE BIOGEL PI INDICATOR 8 (GLOVE) ×2
GLOVE SURG SS PI 7.0 STRL IVOR (GLOVE) ×3 IMPLANT
GLOVE SURG SS PI 7.5 STRL IVOR (GLOVE) ×1 IMPLANT
GLOVE SURG SS PI 8.0 STRL IVOR (GLOVE) ×6 IMPLANT
GOWN STRL REUS W/TWL XL LVL3 (GOWN DISPOSABLE) ×6 IMPLANT
HANDPIECE INTERPULSE COAX TIP (DISPOSABLE) ×3
HEMOSTAT SPONGE AVITENE ULTRA (HEMOSTASIS) ×1 IMPLANT
IMMOBILIZER KNEE 20 (SOFTGOODS) ×3
IMMOBILIZER KNEE 20 THIGH 36 (SOFTGOODS) ×1 IMPLANT
MANIFOLD NEPTUNE II (INSTRUMENTS) ×3 IMPLANT
NS IRRIG 1000ML POUR BTL (IV SOLUTION) IMPLANT
PACK TOTAL KNEE CUSTOM (KITS) ×3 IMPLANT
POSITIONER SURGICAL ARM (MISCELLANEOUS) ×3 IMPLANT
SET HNDPC FAN SPRY TIP SCT (DISPOSABLE) ×1 IMPLANT
SPONGE GAUZE 2X2 STER 10/PKG (GAUZE/BANDAGES/DRESSINGS)
SPONGE SURGIFOAM ABS GEL 100 (HEMOSTASIS) IMPLANT
STAPLER VISISTAT (STAPLE) IMPLANT
STRIP CLOSURE SKIN 1/2X4 (GAUZE/BANDAGES/DRESSINGS) ×2 IMPLANT
SUT BONE WAX W31G (SUTURE) IMPLANT
SUT MNCRL AB 4-0 PS2 18 (SUTURE) IMPLANT
SUT STRATAFIX 0 PDS 27 VIOLET (SUTURE) ×3
SUT VIC AB 1 CT1 27 (SUTURE) ×6
SUT VIC AB 1 CT1 27XBRD ANTBC (SUTURE) ×2 IMPLANT
SUT VIC AB 2-0 CT1 27 (SUTURE) ×9
SUT VIC AB 2-0 CT1 TAPERPNT 27 (SUTURE) ×3 IMPLANT
SUTURE STRATFX 0 PDS 27 VIOLET (SUTURE) ×1 IMPLANT
SYR 50ML LL SCALE MARK (SYRINGE) IMPLANT
TOWER CARTRIDGE SMART MIX (DISPOSABLE) ×3 IMPLANT
TRAY FOLEY CATH 14FRSI W/METER (CATHETERS) ×2 IMPLANT
TRAY FOLEY W/METER SILVER 16FR (SET/KITS/TRAYS/PACK) ×1 IMPLANT
WATER STERILE IRR 1500ML POUR (IV SOLUTION) ×3 IMPLANT
WRAP KNEE MAXI GEL POST OP (GAUZE/BANDAGES/DRESSINGS) ×3 IMPLANT
YANKAUER SUCT BULB TIP 10FT TU (MISCELLANEOUS) ×3 IMPLANT

## 2017-01-12 NOTE — Progress Notes (Signed)
Assisted Dr. Rob Fitzgerald with left, ultrasound guided, adductor canal block. Side rails up, monitors on throughout procedure. See vital signs in flow sheet. Tolerated Procedure well.  

## 2017-01-12 NOTE — H&P (View-Only) (Signed)
[name] DOB: 02/07/1941 Married / Language: English / Race: Black or African American Female  History of Present Illness  The patient is a 76 year old female who comes in today for a preoperative History and Physical. The patient is scheduled for a left total knee arthroplasty to be performed by Dr. Johnn Hai, MD at [facility] on 01/12/2017 . Please see the hospital record for complete dictated history and physical. Note for "H & P": Dr. Tonita Cong and the patient mutually agreed to proceed with a total knee replacement. Risks and benefits of the procedure were discussed including stiffness, suboptimal range of motion, persistent pain, infection requiring removal of prosthesis and reinsertion, need for prophylactic antibiotics in the future, for example, dental procedures, possible need for manipulation, revision in the future and also anesthetic complications including DVT, PE, etc. We discussed the perioperative course, time in the hospital, postoperative recovery and the need for elevation to control swelling. We also discussed the predicted range of motion and the probability that squatting and kneeling would be unobtainable in the future. In addition, postoperative anticoagulation was discussed. We have obtained preoperative medical clearance as necessary. Provided illustrated handout and discussed it in detail. They will enroll in the total joint replacement educational forum at the hospital. Pt had [facility] pre-op last week.  Problem List/Past Medical Hx Lumbosacral DDD (M51.37)  Other idiopathic scoliosis, lumbar region (M41.26)  Lumbar spine pain (M54.5)  Primary localized osteoarthritis of left knee (M17.12)  Spondylolisthesis of lumbar region (M43.16)  Sciatica of left side (M54.32)  Diabetes Mellitus, Type I  Diverticulitis Of Colon  Gastroesophageal Reflux Disease  High blood pressure  Osteoarthritis  Osteoporosis  Peripheral Neuropathy  Rheumatoid  Arthritis  Sleep Apnea  on CPAP  Allergies Penicillin V *PENICILLINS*  hives, itching  Family History Cancer  Paternal Grandmother. Cerebrovascular Accident  Sister. Diabetes Mellitus  Maternal Grandmother. Heart Disease  Father. Osteoarthritis  Maternal Grandfather. Rheumatoid Arthritis  Mother, Paternal Jon Gills. First Degree Relatives   Social History  Tobacco use  Former smoker. 12/30/2014: smoke(d) less than 1/2 pack(s) per day Children  2 Current drinker  12/30/2014: Currently drinks wine only occasionally per week Current work status  retired Furniture conservator/restorer weekly; does running / walking and individual sport Living situation  live with spouse Marital status  married No history of drug/alcohol rehab  Number of flights of stairs before winded  1 Tobacco / smoke exposure  12/30/2014: no Under pain contract   Medication History Meloxicam (15MG  Tablet, 1 Tablet Oral daily, Taken starting 09/21/2016) Active. (with food) Potassium (Oral) Specific strength unknown - Active. Famotidine (20MG  Tablet, Oral) Active. GlipiZIDE ER (5MG  Tablet ER 24HR, Oral) Active. Losartan Potassium (50MG  Tablet, Oral) Active. Meclizine HCl (25MG  Tablet, Oral) Active. MetFORMIN HCl ER (500MG  Tablet ER 24HR, Oral) Active. OXcarbazepine (150MG  Tablet, Oral) Active. Gabapentin (Oral) Specific strength unknown - Active.  Past Surgical History Hysterectomy  partial (non-cancerous) Total Knee Replacement  right  Review of Systems General Present- Fatigue. Not Present- Chills, Fever, Memory Loss, Night Sweats, Weight Gain and Weight Loss. Skin Not Present- Eczema, Hives, Itching, Lesions and Rash. HEENT Present- Blurred Vision, Headache and Hearing Loss. Not Present- Dentures, Double Vision, Tinnitus and Visual Loss. Respiratory Present- Cough. Not Present- Allergies, Chronic Cough, Coughing up blood, Shortness of breath at rest and Shortness of breath with  exertion. Cardiovascular Not Present- Chest Pain, Difficulty Breathing Lying Down, Murmur, Palpitations, Racing/skipping heartbeats and Swelling. Gastrointestinal Present- Constipation, Heartburn and Loss of appetitie.  Not Present- Abdominal Pain, Bloody Stool, Diarrhea, Difficulty Swallowing, Jaundice, Nausea and Vomiting. Female Genitourinary Present- Urinary frequency and Urinating at Night. Not Present- Blood in Urine, Discharge, Flank Pain, Incontinence, Painful Urination, Urgency, Urinary Retention and Weak urinary stream. Musculoskeletal Not Present- Back Pain, Joint Pain, Joint Swelling, Morning Stiffness, Muscle Pain, Muscle Weakness and Spasms. Neurological Present- Dizziness. Not Present- Blackout spells, Difficulty with balance, Paralysis, Tremor and Weakness. Psychiatric Not Present- Insomnia.  Physical Exam General Mental Status -Alert, cooperative and good historian. General Appearance-pleasant, Not in acute distress. Orientation-Oriented X3. Build & Nutrition-Well nourished and Well developed.  Head and Neck Head-normocephalic, atraumatic . Neck Global Assessment - supple, no bruit auscultated on the right, no bruit auscultated on the left.  Eye Pupil - Bilateral-Regular and Round. Motion - Bilateral-EOMI.  Chest and Lung Exam Auscultation Breath sounds - clear at anterior chest wall and clear at posterior chest wall. Adventitious sounds - No Adventitious sounds.  Cardiovascular Auscultation Rhythm - Regular rate and rhythm. Heart Sounds - S1 WNL and S2 WNL. Murmurs & Other Heart Sounds - Auscultation of the heart reveals - No Murmurs.  Abdomen Palpation/Percussion Tenderness - Abdomen is non-tender to palpation. Rigidity (guarding) - Abdomen is soft. Auscultation Auscultation of the abdomen reveals - Bowel sounds normal.  Female Genitourinary Not done, not pertinent to present illness  Musculoskeletal Tender in medial joint line. Patellofemoral  pain to compression. Range is -5 to 90. Knee exam on inspection reveals no evidence of soft tissue swelling, ecchymosis, deformity or erythema. On palpation there is no tenderness in the lateral joint line. Nontender over the fibular head or the peroneal nerve. Nontender over the quadriceps insertion of the patellar ligament insertion. Provocative maneuvers revealed a negative Lachman, negative anterior and posterior drawer and a negative McMurray. No instability was noted with varus and valgus stressing at 0 or 30 degrees. On manual motor test the quadriceps and hamstrings were 5/5. Sensory exam was intact to light touch.  Imaging Bone on bone arthrosis in medial compartment of the knee with varus deformity.  Assessment & Plan Primary localized osteoarthritis of left knee (M17.12)  Pt with end-stage left knee DJD, bone-on-bone, refractory to conservative tx, scheduled for left total knee replacement by Dr. Tonita Cong 01/12/17. We again discussed the procedure itself as well as risks, complications and alternatives, including but not limited to DVT, PE, infx, bleeding, failure of procedure, need for secondary procedure including manipulation, nerve injury, ongoing pain/symptoms, anesthesia risk, even stroke or death. Also discussed typical post-op protocols, activity restrictions, need for PT, flexion/extension exercises, time out of work. Discussed need for DVT ppx post-op per protocol. Discussed dental ppx and infx prevention. Also discussed limitations post-operatively such as kneeling and squatting. All questions were answered. Patient desires to proceed with surgery as scheduled. Will hold supplements, ASA and NSAIDs accordingly. Will remain NPO after MN night before surgery. [facility] pre-op testing. Anticipate hospital stay to include at least 2 midnights given medical history and to ensure proper pain control. Plan ASA 325mg  BID for DVT ppx post-op, no hx of DVT/PE. Plan pain medication,  Colace, Miralax. Plan possible short rehab stay vs. home with HHPT, no family at home for assistance. Will follow up 10-14 days post-op for suture removal and xrays.  Plan left total knee replacement  Signed electronically by Cecilie Kicks, PA-C for Dr. Tonita Cong

## 2017-01-12 NOTE — Interval H&P Note (Signed)
History and Physical Interval Note:  01/12/2017 9:24 AM  [name]  has presented today for surgery, with the diagnosis of Degenerative joint disease left knee  The various methods of treatment have been discussed with the patient and family. After consideration of risks, benefits and other options for treatment, the patient has consented to  Procedure(s) with comments: LEFT TOTAL KNEE ARTHROPLASTY (Left) - 120 mins as a surgical intervention .  The patient's history has been reviewed, patient examined, no change in status, stable for surgery.  I have reviewed the patient's chart and labs.  Questions were answered to the patient's satisfaction.     Piotr Christopher C

## 2017-01-12 NOTE — Brief Op Note (Signed)
01/12/2017  11:38 AM  PATIENT:  Kendra Opitz  76 y.o. female  PRE-OPERATIVE DIAGNOSIS:  Degenerative joint disease left knee  POST-OPERATIVE DIAGNOSIS:  Degenerative joint disease left knee  PROCEDURE:  Procedure(s) with comments: LEFT TOTAL KNEE ARTHROPLASTY (Left) - 120 mins  SURGEON:  Surgeon(s) and Role:    Susa Day, MD - Primary  PHYSICIAN ASSISTANT:   ASSISTANTS: [place]   ANESTHESIA:   general  EBL:  Total I/O In: 1000 [I.V.:1000] Out: -   BLOOD ADMINISTERED:none  DRAINS: none   LOCAL MEDICATIONS USED:  MARCAINE     SPECIMEN:  No Specimen  DISPOSITION OF SPECIMEN:  N/A  COUNTS:  YES  TOURNIQUET:   Total Tourniquet Time Documented: Thigh (Left) - 54 minutes Total: Thigh (Left) - 54 minutes   DICTATION: .Other Dictation: Dictation Number [id number]  PLAN OF CARE: Admit to inpatient   PATIENT DISPOSITION:  PACU - hemodynamically stable.   Delay start of Pharmacological VTE agent (>24hrs) due to surgical blood loss or risk of bleeding: no

## 2017-01-12 NOTE — Op Note (Deleted)
  The note originally documented on this encounter has been moved the the encounter in which it belongs.  

## 2017-01-12 NOTE — Discharge Instructions (Signed)

## 2017-01-12 NOTE — Progress Notes (Signed)
Pt has declined use of CPAP tonight. RT will cont to assess and monitor as needed.

## 2017-01-12 NOTE — Progress Notes (Signed)
Patient is alert and oriented x 3. S/P Left total knee arthroplasty. Denies pain. IVF infusing. Ginger ale given.No n/v at this time. Will continue to monitor.

## 2017-01-12 NOTE — Anesthesia Postprocedure Evaluation (Signed)
Anesthesia Post Note  Patient: ELEORA SUTHERLAND  Procedure(s) Performed: Procedure(s) (LRB): LEFT TOTAL KNEE ARTHROPLASTY (Left)     Patient location during evaluation: PACU Anesthesia Type: Spinal Level of consciousness: awake and alert Pain management: pain level controlled Vital Signs Assessment: post-procedure vital signs reviewed and stable Respiratory status: spontaneous breathing and respiratory function stable Cardiovascular status: blood pressure returned to baseline and stable Postop Assessment: spinal receding Anesthetic complications: no    Last Vitals:  Vitals:   01/12/17 1315 01/12/17 1325  BP: (!) 130/55 127/67  Pulse: (!) 56 (!) 59  Resp: 11 17  Temp:  36.5 C    Last Pain:  Vitals:   01/12/17 0838  TempSrc:   PainSc: 5     LLE Motor Response: Purposeful movement;Responds to commands (01/12/17 1325) LLE Sensation: Tingling;Numbness (01/12/17 1325) RLE Motor Response: No movement due to regional block (01/12/17 1325) RLE Sensation: Numbness;Tingling (01/12/17 1325) L Sensory Level: L3-Anterior knee, lower leg (01/12/17 1325) R Sensory Level: L4-Anterior knee, lower leg (01/12/17 1325)  Tiajuana Amass

## 2017-01-12 NOTE — Anesthesia Preprocedure Evaluation (Addendum)
Anesthesia Evaluation  Patient identified by MRN, date of birth, ID band Patient awake    Reviewed: Allergy & Precautions, NPO status , Patient's Chart, lab work & pertinent test results  Airway Mallampati: III  TM Distance: >3 FB Neck ROM: Full    Dental   Pulmonary sleep apnea and Continuous Positive Airway Pressure Ventilation , former smoker,    breath sounds clear to auscultation       Cardiovascular hypertension,  Rhythm:Regular Rate:Normal     Neuro/Psych Seizures -,  Anxiety Depression    GI/Hepatic Neg liver ROS, GERD  Medicated,  Endo/Other  diabetes, Type 2, Oral Hypoglycemic Agents  Renal/GU negative Renal ROS     Musculoskeletal  (+) Arthritis ,   Abdominal   Peds  Hematology negative hematology ROS (+)   Anesthesia Other Findings   Reproductive/Obstetrics                            Lab Results  Component Value Date   WBC 8.5 01/03/2017   HGB 13.2 01/03/2017   HCT 38.7 01/03/2017   MCV 88.4 01/03/2017   PLT 296 01/03/2017   Lab Results  Component Value Date   CREATININE 0.74 01/03/2017   BUN 11 01/03/2017   NA 135 01/03/2017   K 4.4 01/03/2017   CL 94 (L) 01/03/2017   CO2 32 01/03/2017    Anesthesia Physical Anesthesia Plan  ASA: II  Anesthesia Plan: Spinal   Post-op Pain Management:    Induction: Intravenous  PONV Risk Score and Plan: 3 and Ondansetron, Dexamethasone, Propofol and Midazolam  Airway Management Planned: Natural Airway and Simple Face Mask  Additional Equipment:   Intra-op Plan:   Post-operative Plan:   Informed Consent: I have reviewed the patients History and Physical, chart, labs and discussed the procedure including the risks, benefits and alternatives for the proposed anesthesia with the patient or authorized representative who has indicated his/her understanding and acceptance.     Plan Discussed with: CRNA  Anesthesia Plan  Comments:        Anesthesia Quick Evaluation

## 2017-01-12 NOTE — Op Note (Signed)
[name], [name]                 ACCOUNT NO.:  [id number]  MEDICAL RECORD NO.:  [id number]  LOCATION:                                 FACILITY:  PHYSICIAN:  Susa Day, M.D.    DATE OF BIRTH:  03/07/1941  DATE OF PROCEDURE:  01/12/2017 DATE OF DISCHARGE:                              OPERATIVE REPORT   PREOPERATIVE DIAGNOSIS:  End-stage osteoarthrosis, degenerative joint disease of left knee.  POSTOPERATIVE DIAGNOSIS:  End-stage osteoarthrosis, degenerative joint disease of left knee.  PROCEDURE PERFORMED:  Left total knee arthroplasty utilizing in Attune rotating platform, 4 femur, 3 tibia, 5 insert, 35 patella.  ANESTHESIA:  Spinal.  ASSISTANT:  Cleophas Dunker, PA, was used throughout the case for the patient positioning, exposure, closure.  HISTORY:  A 76, bone-on-bone medial compartment OCD, refractory to conservative treatment, unable to walk, significant negative effect to her activities of daily living.  X-rays indicate bone-on-bone arthrosis, medial compartment, patellofemoral joint indicated for placement of degenerated joint.  Risks and benefits discussed including bleeding, infection, damage to neurovascular structures, need for revision, DVT, PE, anesthetic complications, etc.  TECHNIQUE:  With the patient in supine position, after induction of adequate anesthesia, 2 g vancomycin, left lower extremity was prepped and draped and exsanguinated in usual sterile fashion.  Thigh tourniquet was inflated to 275 mmHg.  Midline incision was made over the knee. Full-thickness flaps developed.  Medial parapatellar arthrotomy performed.  Soft tissues elevated medially, preserving the MCL. Osteophytes removed with rongeur and knee flexed.  Tricompartmental osteoarthrosis was noted particularly in medial compartment.  Step drill was utilized in the femoral canal to remove the remnants of medial and lateral menisci.  A 5 degree left with 9 off the distal femur  was selected.  This was pinned, oscillating saw performed a distal femoral cut.  Following that, we sized the distal femur off the anterior cortex to a 4 pinned, 3 degrees of external rotation.  We performed anterior and posterior chamfer cuts protecting soft tissues at all times.  We then subluxed the tibia.  Using an external alignment guide, 2 of the defects, which were medially bisecting the tibiotalar joint parallel to the shaft, 3 degree slope performed our oscillating cut protecting soft tissues.  Measured it to a 3, maximized our coverage, pinned the tibial tray and harvested bone from the tibial canal, then drilled it centrally, used our punch guide.  We then turned our attention back towards the femur and cut our box cut with appropriate jig, then we placed a trial 4 femur, 3 tibia was placed, 5 insert, reduced the knee. We had full extension, full flexion, good stability, varus and valgus stressing 0-30 degrees.  Negative anterior drawer.  Then, we drilled our lug holes.  Everted the patella, measured it to a 22, plain, 7.5 from that to a 15 utilizing external alignment guide parallel.  It measured optimally to a 35, drilled our PEG holes medializing those.  Placed a trial patella, reduced it, and had excellent patellofemoral tracking. We removed all trials, inspected posteriorly, cauterized the geniculates.  Then, the remnants of the menisci removed, popliteus as was the capsule intact.  I copiously irrigated pulsatile  lavage.  Flexed the knee, dried all surfaces thoroughly, bearing from the cement which was mixed in the back table in appropriate fashion.  It was then injected into the tibial canal, digitally pressurizing that we then impacted the 3 tibial tray, redundant cement removed.  Cement impacted the 4 femur, narrow, redundant cement removed.  Placed a trial 5, reduced it, held axial load throughout the curing of the cement, cemented and clamped the 35 patella.  After  curing of the cement, the tourniquet was released for 53 minutes, cauterized any bleeding which was minimal, removed the insert, meticulously removed all redundant cement.  Pulsatile lavage into the joint, copious irrigation with antibiotic irrigation.  Flexed the knee, inserted a 5 insert, reduced it and had full flexion, full extension and good stability in varus and valgus stressing at 0 to 30 degrees.  Negative anterior drawer.  I then slightly flexed the knee, reapproximated the patellar arthrotomy with #1 Vicryl interrupted figure-of-eight sutures and then a running Stratafix over top of that.  Following that, we flexed the knee, and had excellent patellofemoral tracking.  I had flexion to gravity at 90 degrees.  Subcu with 2-0, and skin with Monocryl, sterile dressing applied.  She had excellent patellofemoral tracking and good stability.  She was also placed in a knee immobilizer, and then transferred to the recovery room in satisfactory condition.  The patient tolerated the procedure well. No complications.     Susa Day, M.D.   ______________________________ Susa Day, M.D.    [name]  D:  01/12/2017  T:  01/12/2017  Job:  [id number]

## 2017-01-12 NOTE — Anesthesia Procedure Notes (Signed)
Spinal  Patient location during procedure: OR End time: 01/12/2017 10:04 AM Staffing Resident/CRNA: Noralyn Pick D Performed: anesthesiologist  Preanesthetic Checklist Completed: patient identified, site marked, surgical consent, pre-op evaluation, timeout performed, IV checked, risks and benefits discussed and monitors and equipment checked Spinal Block Patient position: sitting Prep: Betadine Patient monitoring: heart rate, continuous pulse ox and blood pressure Approach: midline Location: L3-4 Injection technique: single-shot Needle Needle type: Sprotte  Needle gauge: 24 G Needle length: 9 cm Assessment Sensory level: T6 Additional Notes Expiration date of kit checked and confirmed. Patient tolerated procedure well, without complications.

## 2017-01-12 NOTE — Progress Notes (Signed)
PHARMACIST - PHYSICIAN ORDER COMMUNICATION  CONCERNING: P&T Medication Policy on Herbal Medications  DESCRIPTION:  This patient's order for:  Beano  has been noted.  This product(s) is classified as an "herbal" or natural product. Due to a lack of definitive safety studies or FDA approval, nonstandard manufacturing practices, plus the potential risk of unknown drug-drug interactions while on inpatient medications, the Pharmacy and Therapeutics Committee does not permit the use of "herbal" or natural products of this type within [facility].   ACTION TAKEN: The pharmacy department is unable to verify this order at this time and your patient has been informed of this safety policy. Please reevaluate patient's clinical condition at discharge and address if the herbal or natural product(s) should be resumed at that time.  

## 2017-01-12 NOTE — Transfer of Care (Signed)
Immediate Anesthesia Transfer of Care Note  Patient: [name]  Procedure(s) Performed: Procedure(s) with comments: LEFT TOTAL KNEE ARTHROPLASTY (Left) - 120 mins  Patient Location: PACU  Anesthesia Type:Regional and Spinal  Level of Consciousness: awake, alert  and oriented  Airway & Oxygen Therapy: Patient Spontanous Breathing and Patient connected to nasal cannula oxygen  Post-op Assessment: Report given to RN and Post -op Vital signs reviewed and stable  Post vital signs: Reviewed and stable  Last Vitals:  Vitals:   01/12/17 0947 01/12/17 0948  BP:  116/69  Pulse: (!) 59 (!) 58  Resp: 14 19  Temp:      Last Pain:  Vitals:   01/12/17 0838  TempSrc:   PainSc: 5       Patients Stated Pain Goal: 3 (64/33/29 5188)  Complications: No apparent anesthesia complications

## 2017-01-12 NOTE — Anesthesia Procedure Notes (Signed)
Anesthesia Regional Block: Adductor canal block   Pre-Anesthetic Checklist: ,, timeout performed, Correct Patient, Correct Site, Correct Laterality, Correct Procedure, Correct Position, site marked, Risks and benefits discussed,  Surgical consent,  Pre-op evaluation,  At surgeon's request and post-op pain management  Laterality: Left  Prep: chloraprep       Needles:  Injection technique: Single-shot  Needle Type: Echogenic Needle     Needle Length: 9cm  Needle Gauge: 21     Additional Needles:   Procedures: ultrasound guided,,,,,,,,  Narrative:  Start time: 01/12/2017 9:37 AM End time: 01/12/2017 9:42 AM Injection made incrementally with aspirations every 5 mL.  Performed by: Personally  Anesthesiologist: Suzette Battiest

## 2017-01-13 LAB — BASIC METABOLIC PANEL
ANION GAP: 8 (ref 5–15)
BUN: 10 mg/dL (ref 6–20)
CO2: 28 mmol/L (ref 22–32)
Calcium: 9.7 mg/dL (ref 8.9–10.3)
Chloride: 99 mmol/L — ABNORMAL LOW (ref 101–111)
Creatinine, Ser: 0.73 mg/dL (ref 0.44–1.00)
Glucose, Bld: 133 mg/dL — ABNORMAL HIGH (ref 65–99)
POTASSIUM: 4.1 mmol/L (ref 3.5–5.1)
SODIUM: 135 mmol/L (ref 135–145)

## 2017-01-13 LAB — GLUCOSE, CAPILLARY
GLUCOSE-CAPILLARY: 117 mg/dL — AB (ref 65–99)
Glucose-Capillary: 111 mg/dL — ABNORMAL HIGH (ref 65–99)
Glucose-Capillary: 156 mg/dL — ABNORMAL HIGH (ref 65–99)
Glucose-Capillary: 204 mg/dL — ABNORMAL HIGH (ref 65–99)

## 2017-01-13 LAB — CBC
HCT: 34.1 % — ABNORMAL LOW (ref 36.0–46.0)
HEMOGLOBIN: 11.6 g/dL — AB (ref 12.0–15.0)
MCH: 30.1 pg (ref 26.0–34.0)
MCHC: 34 g/dL (ref 30.0–36.0)
MCV: 88.6 fL (ref 78.0–100.0)
PLATELETS: 249 10*3/uL (ref 150–400)
RBC: 3.85 MIL/uL — AB (ref 3.87–5.11)
RDW: 13.6 % (ref 11.5–15.5)
WBC: 8.8 10*3/uL (ref 4.0–10.5)

## 2017-01-13 MED ORDER — DIPHENHYDRAMINE HCL 25 MG PO CAPS
25.0000 mg | ORAL_CAPSULE | Freq: Four times a day (QID) | ORAL | Status: DC | PRN
  Administered 2017-01-13: 18:00:00 25 mg via ORAL
  Filled 2017-01-13: qty 1

## 2017-01-13 NOTE — Progress Notes (Signed)
Plan for d/c to SNF, discharge planning per CSW. [phone] 

## 2017-01-13 NOTE — Clinical Social Work Placement (Signed)
   CLINICAL SOCIAL WORK PLACEMENT  NOTE  Date:  01/13/2017  Patient Details  Name: [name] MRN: [id number] Date of Birth: 1940/11/10  Clinical Social Work is seeking post-discharge placement for this patient at the [place] level of care (*CSW will initial, date and re-position this form in  chart as items are completed):  Yes   Patient/family provided with [facility] Work Department's list of facilities offering this level of care within the geographic area requested by the patient (or if unable, by the patient's family).  Yes   Patient/family informed of their freedom to choose among providers that offer the needed level of care, that participate in Medicare, Medicaid or managed care program needed by the patient, have an available bed and are willing to accept the patient.  Yes   Patient/family informed of [facility]'s ownership interest in [facility] and [facility], as well as of the fact that they are under no obligation to receive care at these facilities.  PASRR submitted to EDS on       PASRR number received on       Existing PASRR number confirmed on 01/13/17     FL2 transmitted to all facilities in geographic area requested by pt/family on 01/13/17     FL2 transmitted to all facilities within larger geographic area on       Patient informed that his/her managed care company has contracts with or will negotiate with certain facilities, including the following:            Patient/family informed of bed offers received.  Patient chooses bed at       Physician recommends and patient chooses bed at      Patient to be transferred to   on  .  Patient to be transferred to facility by       Patient family notified on   of transfer.  Name of family member notified:        PHYSICIAN       Additional Comment:    _______________________________________________ Luretha Rued, [place] 01/13/2017, 11:28  AM

## 2017-01-13 NOTE — Progress Notes (Signed)
Physical Therapy Treatment Patient Details Name: [name] MRN: [id number] DOB: 03/17/1941 Today's Date: 01/13/2017    History of Present Illness Pt is a 76 y.o. female s/p L TKA. PMHx: Scoliosis, DM 1, HTN, Peripheral neuropathy, RA, Sleep Apnea.    PT Comments    Pt motivated and progressing steadily with mobility.   Follow Up Recommendations  SNF     Equipment Recommendations  None recommended by PT    Recommendations for Other Services OT consult     Precautions / Restrictions Precautions Precautions: Knee;Fall Precaution Booklet Issued: No Required Braces or Orthoses: Knee Immobilizer - Left Knee Immobilizer - Left: Discontinue once straight leg raise with < 10 degree lag Restrictions Weight Bearing Restrictions: No Other Position/Activity Restrictions: WBAT    Mobility  Bed Mobility Overal bed mobility: Needs Assistance Bed Mobility: Sit to Supine     Supine to sit: Min assist Sit to supine: Min assist   General bed mobility comments: cues for sequence and use of R LE to self assist.  Min assist to manage L LE  Transfers Overall transfer level: Needs assistance Equipment used: Rolling walker (2 wheeled) Transfers: Sit to/from Stand Sit to Stand: Min assist         General transfer comment: cues for LE management and use of UEs to self assist  Ambulation/Gait Ambulation/Gait assistance: Min assist Ambulation Distance (Feet): 56 Feet Assistive device: Rolling walker (2 wheeled) Gait Pattern/deviations: Step-to pattern;Decreased step length - right;Decreased step length - left;Shuffle;Trunk flexed Gait velocity: decr Gait velocity interpretation: Below normal speed for age/gender General Gait Details: cues for sequence, posture and position from [employer]            Wheelchair Mobility    Modified Rankin (Stroke Patients Only)       Balance Overall balance assessment: Needs assistance Sitting-balance support: Feet supported;No  upper extremity supported Sitting balance-Leahy Scale: Good     Standing balance support: Bilateral upper extremity supported Standing balance-Leahy Scale: Poor Standing balance comment: Static standing at sink for ADL                            Cognition Arousal/Alertness: Awake/alert Behavior During Therapy: WFL for tasks assessed/performed Overall Cognitive Status: Within Functional Limits for tasks assessed                                        Exercises Total Joint Exercises Ankle Circles/Pumps: AROM;Both;15 reps;Supine Quad Sets: AROM;Both;10 reps;Supine Heel Slides: AAROM;Left;15 reps;Supine Straight Leg Raises: AAROM;Left;10 reps;Supine    General Comments        Pertinent Vitals/Pain Pain Assessment: 0-10 Pain Score: 4  Faces Pain Scale: Hurts even more Pain Location: L knee Pain Descriptors / Indicators: Aching;Sore Pain Intervention(s): Limited activity within patient's tolerance;Monitored during session;Premedicated before session;Ice applied    Home Living Family/patient expects to be discharged to:: Skilled nursing facility                    Prior Function Level of Independence: Independent          PT Goals (current goals can now be found in the care plan section) Acute Rehab PT Goals Patient Stated Goal: rehab then home PT Goal Formulation: With patient Time For Goal Achievement: 01/19/17 Potential to Achieve Goals: Good Progress towards PT goals: Progressing toward goals  Frequency    7X/week      PT Plan Current plan remains appropriate    Co-evaluation              AM-PAC PT "6 Clicks" Daily Activity  Outcome Measure  Difficulty turning over in bed (including adjusting bedclothes, sheets and blankets)?: Total Difficulty moving from lying on back to sitting on the side of the bed? : Total Difficulty sitting down on and standing up from a chair with arms (e.g., wheelchair, bedside commode,  etc,.)?: Total Help needed moving to and from a bed to chair (including a wheelchair)?: A Little Help needed walking in hospital room?: A Little Help needed climbing 3-5 steps with a railing? : A Lot 6 Click Score: 11    End of Session Equipment Utilized During Treatment: Gait belt;Left knee immobilizer Activity Tolerance: Patient tolerated treatment well;Patient limited by fatigue Patient left: with call bell/phone within reach;in bed;with bed alarm set Nurse Communication: Mobility status PT Visit Diagnosis: Unsteadiness on feet (R26.81);Difficulty in walking, not elsewhere classified (R26.2)     Time: 6438-3818 PT Time Calculation (min) (ACUTE ONLY): 34 min  Charges:  $Gait Training: 23-37 mins $Therapeutic Exercise: 8-22 mins                    G Codes:       Pg [phone]    Terrye Dombrosky 01/13/2017, 3:26 PM

## 2017-01-13 NOTE — Evaluation (Signed)
Occupational Therapy Evaluation Patient Details Name: [name] MRN: [id number] DOB: Feb 28, 1941 Today's Date: 01/13/2017    History of Present Illness Pt is a 76 y.o. female s/p L TKA. PMHx: Scoliosis, DM 1, HTN, Peripheral neuropathy, RA, Sleep Apnea.   Clinical Impression   Pt reports she was independent with ADL PTA. Currently pt requires min assist for functional mobility and LB ADL and requires min guard for all other ADL in standing. Recommending SNF for follow up to maximize independence and safety with ADL and functional mobility prior to return home alone. Pt would benefit from continued skilled OT to address established goals.    Follow Up Recommendations  SNF    Equipment Recommendations  Other (comment) (TBD at next venue)    Recommendations for Other Services       Precautions / Restrictions Precautions Precautions: Knee;Fall Precaution Booklet Issued: No Required Braces or Orthoses: Knee Immobilizer - Left Restrictions Weight Bearing Restrictions: No      Mobility Bed Mobility               General bed mobility comments: Pt OOB in chair upon arrival.  Transfers Overall transfer level: Needs assistance Equipment used: Rolling walker (2 wheeled) Transfers: Sit to/from Stand Sit to Stand: Min guard         General transfer comment: Min guard for safety, cues for hand placement and technique    Balance Overall balance assessment: Needs assistance Sitting-balance support: Feet supported;No upper extremity supported Sitting balance-Leahy Scale: Good     Standing balance support: No upper extremity supported;During functional activity Standing balance-Leahy Scale: Fair Standing balance comment: Static standing at sink for ADL                           ADL either performed or assessed with clinical judgement   ADL Overall ADL's : Needs assistance/impaired Eating/Feeding: Set up;Sitting   Grooming: Min guard;Standing;Wash/dry  face;Oral care;Applying deodorant   Upper Body Bathing: Min guard;Standing   Lower Body Bathing: Minimal assistance   Upper Body Dressing : Min guard;Standing       Toilet Transfer: Minimal assistance;Ambulation;BSC;RW           Functional mobility during ADLs: Minimal assistance;Rolling walker       Vision         Perception     Praxis      Pertinent Vitals/Pain Pain Assessment: Faces Faces Pain Scale: Hurts even more Pain Location: L knee Pain Descriptors / Indicators: Discomfort;Grimacing;Sore Pain Intervention(s): Monitored during session     Hand Dominance     Extremity/Trunk Assessment Upper Extremity Assessment Upper Extremity Assessment: Overall WFL for tasks assessed   Lower Extremity Assessment Lower Extremity Assessment: Defer to PT evaluation       Communication Communication Communication: No difficulties   Cognition Arousal/Alertness: Awake/alert Behavior During Therapy: WFL for tasks assessed/performed Overall Cognitive Status: Within Functional Limits for tasks assessed                                     General Comments       Exercises     Shoulder Instructions      Home Living Family/patient expects to be discharged to:: Skilled nursing facility  Prior Functioning/Environment Level of Independence: Independent                 OT Problem List: Decreased strength;Decreased range of motion;Impaired balance (sitting and/or standing);Decreased knowledge of use of DME or AE;Pain      OT Treatment/Interventions: Self-care/ADL training;Energy conservation;DME and/or AE instruction;Therapeutic activities;Patient/family education;Balance training    OT Goals(Current goals can be found in the care plan section) Acute Rehab OT Goals Patient Stated Goal: rehab then home OT Goal Formulation: With patient Time For Goal Achievement: 01/27/17 Potential to  Achieve Goals: Good ADL Goals Pt Will Perform Lower Body Bathing: with modified independence;sit to/from stand Pt Will Perform Lower Body Dressing: with modified independence;sit to/from stand Pt Will Transfer to Toilet: with modified independence;ambulating;bedside commode (over toilet) Pt Will Perform Toileting - Clothing Manipulation and hygiene: with modified independence;sit to/from stand  OT Frequency: Min 2X/week   Barriers to D/C: Decreased caregiver support  pt lives alone       Co-evaluation              AM-PAC PT "6 Clicks" Daily Activity     Outcome Measure Help from another person eating meals?: None Help from another person taking care of personal grooming?: A Little Help from another person toileting, which includes using toliet, bedpan, or urinal?: A Little Help from another person bathing (including washing, rinsing, drying)?: A Little Help from another person to put on and taking off regular upper body clothing?: A Little Help from another person to put on and taking off regular lower body clothing?: A Little 6 Click Score: 19   End of Session Equipment Utilized During Treatment: Rolling walker;Left knee immobilizer  Activity Tolerance: Patient tolerated treatment well Patient left: in chair;with call bell/phone within reach;with family/visitor present  OT Visit Diagnosis: Unsteadiness on feet (R26.81);Other abnormalities of gait and mobility (R26.89);Pain Pain - Right/Left: Left Pain - part of body: Knee                Time: 1101-1139 OT Time Calculation (min): 38 min Charges:  OT General Charges $OT Visit: 1 Procedure OT Evaluation $OT Eval Moderate Complexity: 1 Procedure OT Treatments $Self Care/Home Management : 23-37 mins G-Codes:     Aslin Farinas A. Ulice Brilliant, M.S., OTR/L Pager: [place] 01/13/2017, 12:29 PM

## 2017-01-13 NOTE — Discharge Summary (Signed)
Physician Discharge Summary   Patient ID: [name] MRN: [id number] DOB/AGE: 03/20/1941 76 y.o.  Admit date: 01/12/2017 Discharge date:  Primary Diagnosis: left knee primary osteoarthritis  Admission Diagnoses:  Past Medical History:  Diagnosis Date  . Allergic rhinitis, cause unspecified 03/13/2013  . Anxiety   . Cerebrovascular disease, unspecified 03/13/2013   Atrophy and small vessel dz noted, MR brain 2009  . Depression   . Diabetes mellitus, type 2 ([place])   . Diverticulosis of colon 03/2010 hosp  . Dyslipidemia   . GERD (gastroesophageal reflux disease)   . Hypertension   . Neuropathy   . OSA on CPAP   . Osteoarthritis of shoulder region    and Knee  . Seizure disorder ([place])    onset 11 years ago; repeated 2013   Discharge Diagnoses:   Principal Problem:   Primary osteoarthritis of left knee Active Problems:   Left knee DJD  Estimated body mass index is 30.9 kg/m as calculated from the following:   Height as of this encounter: '5\' 4"'  (1.626 m).   Weight as of this encounter: 81.6 kg (180 lb).  Procedure:  Procedure(s) (LRB): LEFT TOTAL KNEE ARTHROPLASTY (Left)   Consults: None  HPI: see H&P Laboratory Data: Admission on 01/12/2017  Component Date Value Ref Range Status  . Glucose-Capillary 01/12/2017 93  65 - 99 mg/dL Final  . Comment 1 01/12/2017 Notify RN   Final  . Glucose-Capillary 01/12/2017 109* 65 - 99 mg/dL Final  . Comment 1 01/12/2017 Notify RN   Final  . Comment 2 01/12/2017 Document in Chart   Final  . WBC 01/13/2017 8.8  4.0 - 10.5 K/uL Final  . RBC 01/13/2017 3.85* 3.87 - 5.11 MIL/uL Final  . Hemoglobin 01/13/2017 11.6* 12.0 - 15.0 g/dL Final  . HCT 01/13/2017 34.1* 36.0 - 46.0 % Final  . MCV 01/13/2017 88.6  78.0 - 100.0 fL Final  . MCH 01/13/2017 30.1  26.0 - 34.0 pg Final  . MCHC 01/13/2017 34.0  30.0 - 36.0 g/dL Final  . RDW 01/13/2017 13.6  11.5 - 15.5 % Final  . Platelets 01/13/2017 249  150 - 400 K/uL Final  . Sodium 01/13/2017  135  135 - 145 mmol/L Final  . Potassium 01/13/2017 4.1  3.5 - 5.1 mmol/L Final  . Chloride 01/13/2017 99* 101 - 111 mmol/L Final  . CO2 01/13/2017 28  22 - 32 mmol/L Final  . Glucose, Bld 01/13/2017 133* 65 - 99 mg/dL Final  . BUN 01/13/2017 10  6 - 20 mg/dL Final  . Creatinine, Ser 01/13/2017 0.73  0.44 - 1.00 mg/dL Final  . Calcium 01/13/2017 9.7  8.9 - 10.3 mg/dL Final  . GFR calc non Af Amer 01/13/2017 >60  >60 mL/min Final  . GFR calc Af Amer 01/13/2017 >60  >60 mL/min Final   Comment: (NOTE) The eGFR has been calculated using the CKD EPI equation. This calculation has not been validated in all clinical situations. eGFR's persistently <60 mL/min signify possible Chronic Kidney Disease.   . Anion gap 01/13/2017 8  5 - 15 Final  . Glucose-Capillary 01/12/2017 172* 65 - 99 mg/dL Final  . Comment 1 01/12/2017 Notify RN   Final  . Comment 2 01/12/2017 Document in Chart   Final  . Glucose-Capillary 01/12/2017 192* 65 - 99 mg/dL Final  . Glucose-Capillary 01/13/2017 111* 65 - 99 mg/dL Final  . Glucose-Capillary 01/13/2017 117* 65 - 99 mg/dL Final  Hospital Outpatient Visit on 01/03/2017  Component Date  Value Ref Range Status  . Glucose-Capillary 01/03/2017 139* 65 - 99 mg/dL Final  . Hgb A1c MFr Bld 01/03/2017 6.7* 4.8 - 5.6 % Final   Comment: (NOTE)         Pre-diabetes: 5.7 - 6.4         Diabetes: >6.4         Glycemic control for adults with diabetes: <7.0   . Mean Plasma Glucose 01/03/2017 146  mg/dL Final   Comment: (NOTE) Performed At: [facility] [place], [place] [id number] Lindon Romp MD [id number]   . aPTT 01/03/2017 33  24 - 36 seconds Final  . Sodium 01/03/2017 135  135 - 145 mmol/L Final  . Potassium 01/03/2017 4.4  3.5 - 5.1 mmol/L Final  . Chloride 01/03/2017 94* 101 - 111 mmol/L Final  . CO2 01/03/2017 32  22 - 32 mmol/L Final  . Glucose, Bld 01/03/2017 106* 65 - 99 mg/dL Final  . BUN 01/03/2017 11  6 - 20 mg/dL Final  .  Creatinine, Ser 01/03/2017 0.74  0.44 - 1.00 mg/dL Final  . Calcium 01/03/2017 10.2  8.9 - 10.3 mg/dL Final  . GFR calc non Af Amer 01/03/2017 >60  >60 mL/min Final  . GFR calc Af Amer 01/03/2017 >60  >60 mL/min Final   Comment: (NOTE) The eGFR has been calculated using the CKD EPI equation. This calculation has not been validated in all clinical situations. eGFR's persistently <60 mL/min signify possible Chronic Kidney Disease.   . Anion gap 01/03/2017 9  5 - 15 Final  . WBC 01/03/2017 8.5  4.0 - 10.5 K/uL Final  . RBC 01/03/2017 4.38  3.87 - 5.11 MIL/uL Final  . Hemoglobin 01/03/2017 13.2  12.0 - 15.0 g/dL Final  . HCT 01/03/2017 38.7  36.0 - 46.0 % Final  . MCV 01/03/2017 88.4  78.0 - 100.0 fL Final  . MCH 01/03/2017 30.1  26.0 - 34.0 pg Final  . MCHC 01/03/2017 34.1  30.0 - 36.0 g/dL Final  . RDW 01/03/2017 13.1  11.5 - 15.5 % Final  . Platelets 01/03/2017 296  150 - 400 K/uL Final  . Prothrombin Time 01/03/2017 13.2  11.4 - 15.2 seconds Final  . INR 01/03/2017 1.00   Final  . ABO/RH(D) 01/03/2017 O POS   Final  . Antibody Screen 01/03/2017 NEG   Final  . Sample Expiration 01/03/2017 01/15/2017   Final  . Extend sample reason 01/03/2017 NO TRANSFUSIONS OR PREGNANCY IN THE PAST 3 MONTHS   Final  . Color, Urine 01/03/2017 YELLOW  YELLOW Final  . APPearance 01/03/2017 CLEAR  CLEAR Final  . Specific Gravity, Urine 01/03/2017 1.012  1.005 - 1.030 Final  . pH 01/03/2017 6.0  5.0 - 8.0 Final  . Glucose, UA 01/03/2017 NEGATIVE  NEGATIVE mg/dL Final  . Hgb urine dipstick 01/03/2017 NEGATIVE  NEGATIVE Final  . Bilirubin Urine 01/03/2017 NEGATIVE  NEGATIVE Final  . Ketones, ur 01/03/2017 NEGATIVE  NEGATIVE mg/dL Final  . Protein, ur 01/03/2017 NEGATIVE  NEGATIVE mg/dL Final  . Nitrite 01/03/2017 NEGATIVE  NEGATIVE Final  . Leukocytes, UA 01/03/2017 NEGATIVE  NEGATIVE Final  . MRSA, PCR 01/03/2017 NEGATIVE  NEGATIVE Final  . Staphylococcus aureus 01/03/2017 NEGATIVE  NEGATIVE Final    Comment:        The Xpert SA Assay (FDA approved for NASAL specimens in patients over 60 years of age), is one component of a comprehensive surveillance program.  Test performance has been validated by [facility] for patients greater than or equal to 40 year old. It is not intended to diagnose infection nor to guide or monitor treatment.      X-Rays:Dg Knee Left Port  Result Date: 01/12/2017 CLINICAL DATA:  Left knee postop. EXAM: PORTABLE LEFT KNEE - 1-2 VIEW COMPARISON:  12/30/2015 . FINDINGS: Patient is status post total left knee replacement. Anatomic alignment . Hardware intact. No evidence of fracture. No acute abnormality. IMPRESSION: Total left knee replacement. Anatomic alignment. No acute abnormality. Electronically Signed   By: Marcello Moores  Register   On: 01/12/2017 12:44    EKG: Orders placed or performed in visit on 11/09/16  . EKG 12-Lead     Hospital Course: NESSIE NONG is a 76 y.o. who was admitted to [facility]. They were brought to the operating room on 01/12/2017 and underwent Procedure(s): LEFT TOTAL KNEE ARTHROPLASTY.  Patient tolerated the procedure well and was later transferred to the recovery room and then to the orthopaedic floor for postoperative care.  They were given PO and IV analgesics for pain control following their surgery.  They were given 24 hours of postoperative antibiotics of  Anti-infectives    Start     Dose/Rate Route Frequency Ordered Stop   01/12/17 2200  vancomycin (VANCOCIN) 1,250 mg in sodium chloride 0.9 % 250 mL IVPB    Comments:  Per pharmacy   1,250 mg 166.7 mL/hr over 90 Minutes Intravenous Every 12 hours 01/12/17 1350 01/12/17 2316   01/12/17 1115  polymyxin B 500,000 Units, bacitracin 50,000 Units in sodium chloride 0.9 % 500 mL irrigation  Status:  Discontinued       As needed 01/12/17 1115 01/12/17 1203   01/12/17 0815  vancomycin (VANCOCIN) IVPB 1000 mg/200 mL premix     1,000 mg 200 mL/hr over 60 Minutes Intravenous  On call to O.R. 01/12/17 0809 01/12/17 1045     and started on DVT prophylaxis in the form of Aspirin, TED hose and SCDs.   PT and OT were ordered for total joint protocol.  Discharge planning consulted to help with postop disposition and equipment needs.  Patient had a good night on the evening of surgery.  They started to get up OOB with therapy on day one.  Continued to work with therapy into day two. By day three, the patient had progressed with therapy and meeting their goals.  Incision was healing well.  Patient was seen in rounds and was ready to go home.   Diet: Regular diet Activity:WBAT Follow-up:in 10-14 days Disposition - SNF short term rehab Discharged Condition: good    Allergies as of 01/13/2017      Reactions   Cymbalta [duloxetine Hcl]    Hives and itching   Effexor [venlafaxine] Nausea Only   Other    "SEEDED" food due to stomach issues   Statins Nausea And Vomiting   Penicillins Hives, Rash   Has patient had a PCN reaction causing immediate rash, facial/tongue/throat swelling, SOB or lightheadedness with hypotension: Yes Has patient had a PCN reaction causing severe rash involving mucus membranes or skin necrosis: No Has patient had a PCN reaction that required hospitalization: No Has patient had a PCN reaction occurring within the last 10 years: Yes If all of the above answers are "NO", then may proceed with Cephalosporin use.      Medication List    STOP taking these medications   aspirin 81 MG tablet Replaced by:  aspirin EC 325 MG tablet   polyethylene glycol  powder powder Commonly known as:  GLYCOLAX/MIRALAX Replaced by:  polyethylene glycol packet   vitamin B-12 1000 MCG tablet Commonly known as:  CYANOCOBALAMIN     TAKE these medications   ACCU-CHEK AVIVA PLUS test strip Generic drug:  glucose blood USE TO CHECK BLOOD SUGARS  TWO TIMES DAILY   ACCU-CHEK AVIVA PLUS w/Device Kit 1 kit by Does not apply route once.   aspirin EC 325 MG  tablet Take 1 tablet (325 mg total) by mouth 2 (two) times daily. Replaces:  aspirin 81 MG tablet   azelastine 0.1 % nasal spray Commonly known as:  ASTELIN Place 2 sprays into both nostrils 2 (two) times daily as needed for rhinitis. Use in each nostril as directed   BEANO PO Take 1-2 tablets by mouth daily as needed (for gas).   Biotin 5000 MCG Tabs Take 5,000 mcg by mouth every other day.   CLEAR EYES MAXIMUM ITCHY EYE OP Place 1 drop into both eyes every 8 (eight) hours as needed (itchy eyes).   clonazePAM 0.5 MG tablet Commonly known as:  KLONOPIN Take 0.5-1 tablets (0.25-0.5 mg total) by mouth 2 (two) times daily as needed for anxiety.   cyclobenzaprine 5 MG tablet Commonly known as:  FLEXERIL TAKE 1 TABLET (5 MG TOTAL) BY MOUTH 3 (THREE) TIMES DAILY AS NEEDED FOR MUSCLE SPASMS.   docusate sodium 100 MG capsule Commonly known as:  COLACE Take 1 capsule (100 mg total) by mouth 2 (two) times daily as needed for mild constipation.   EQL VITAMIN D3 1000 units tablet Generic drug:  Cholecalciferol Take 1,000 Units by mouth every other day.   famotidine 20 MG tablet Commonly known as:  PEPCID TAKE 1 TABLET (20 MG TOTAL) BY MOUTH DAILY. What changed:  See the new instructions.   gabapentin 800 MG tablet Commonly known as:  NEURONTIN TAKE 1 TABLET BY MOUTH 3  TIMES DAILY   gabapentin 100 MG capsule Commonly known as:  NEURONTIN Take 1 capsule (100 mg total) by mouth 3 (three) times daily. In addition to 800 mg TID for total of 900 mg TID   glipiZIDE 5 MG 24 hr tablet Commonly known as:  GLUCOTROL XL Take 5 mg by mouth daily with supper.   hydrochlorothiazide 25 MG tablet Commonly known as:  HYDRODIURIL Take 1 tablet (25 mg total) by mouth daily.   linaclotide 145 MCG Caps capsule Commonly known as:  LINZESS Take 1 capsule (145 mcg total) by mouth daily before breakfast.   meclizine 12.5 MG tablet Commonly known as:  ANTIVERT Take 1 tablet (12.5 mg total) by  mouth 3 (three) times daily as needed for dizziness.   metFORMIN 500 MG 24 hr tablet Commonly known as:  GLUCOPHAGE-XR TAKE 1 TABLET (500 MG TOTAL) BY MOUTH DAILY WITH BREAKFAST.   multivitamin capsule Take 1 capsule by mouth every other day.   OXcarbazepine 150 MG tablet Commonly known as:  TRILEPTAL TAKE 1.5 TABLETS (225 MG TOTAL) BY MOUTH 2 (TWO) TIMES DAILY.   polyethylene glycol packet Commonly known as:  MIRALAX / GLYCOLAX Take 17 g by mouth daily. Replaces:  polyethylene glycol powder powder   potassium chloride SA 20 MEQ tablet Commonly known as:  KLOR-CON M20 Take 2 tablets (40 mEq total) by mouth daily. What changed:  how much to take  when to take this   PROAIR HFA 108 (90 Base) MCG/ACT inhaler Generic drug:  albuterol Inhale 2 puffs into the lungs every 6 (six) hours as needed for wheezing  or shortness of breath.   PROBIOTIC PO Take 1 capsule by mouth daily after breakfast.   ranitidine 150 MG tablet Commonly known as:  ZANTAC Take 1 tablet (150 mg total) by mouth 2 (two) times daily. What changed:  when to take this  reasons to take this   traMADol 50 MG tablet Commonly known as:  ULTRAM Take 1 tablet (50 mg total) by mouth every 6 (six) hours as needed. What changed:  when to take this      Follow-up Information    Susa Day, MD Follow up in 2 week(s).   Specialty:  Orthopedic Surgery Contact information: [address] [place] [address] [phone]        Susa Day, MD In 2 weeks.   Specialty:  Orthopedic Surgery Contact information: [address] [place] [address] [phone]           Signed: Lacie Draft, PA-C Orthopaedic Surgery 01/13/2017, 1:15 PM

## 2017-01-13 NOTE — Progress Notes (Signed)
CSW consulted to assist with d/c planning. PT has recommended SNF at dc. Pt is in agreement with this plan. SNF search initiated and bed offers pending. CSW will continue to follow to assist with d/c planning.  Werner Lean LCSW [phone]

## 2017-01-13 NOTE — Evaluation (Signed)
Physical Therapy Evaluation Patient Details Name: [name] MRN: [id number] DOB: 1940/10/07 Today's Date: 01/13/2017   History of Present Illness  Pt is a 76 y.o. female s/p L TKA. PMHx: Scoliosis, DM 1, HTN, Peripheral neuropathy, RA, Sleep Apnea.  Clinical Impression  Pt s/p L TKR and presents with decreased L LE strength/ROM and post op pain limiting functional mobility.  Pt would benefit from follow up rehab at SNF level to maximize IND and safety prior to return home ALONE!    Follow Up Recommendations SNF    Equipment Recommendations  None recommended by PT    Recommendations for Other Services OT consult     Precautions / Restrictions Precautions Precautions: Knee;Fall Precaution Booklet Issued: No Required Braces or Orthoses: Knee Immobilizer - Left Knee Immobilizer - Left: Discontinue once straight leg raise with < 10 degree lag Restrictions Weight Bearing Restrictions: No Other Position/Activity Restrictions: WBAT      Mobility  Bed Mobility Overal bed mobility: Needs Assistance Bed Mobility: Supine to Sit     Supine to sit: Min assist     General bed mobility comments: cues for sequence and use of R LE to self assist.  Min assist to manage L LE  Transfers Overall transfer level: Needs assistance Equipment used: Rolling walker (2 wheeled) Transfers: Sit to/from Stand Sit to Stand: Min assist         General transfer comment: cues for LE management and use of UEs to self assist  Ambulation/Gait Ambulation/Gait assistance: Min assist;Mod assist Ambulation Distance (Feet): 26 Feet Assistive device: Rolling walker (2 wheeled) Gait Pattern/deviations: Step-to pattern;Decreased step length - right;Decreased step length - left;Shuffle;Trunk flexed Gait velocity: decr Gait velocity interpretation: Below normal speed for age/gender General Gait Details: cues for sequence, posture and position from [employer]            Wheelchair Mobility     Modified Rankin (Stroke Patients Only)       Balance Overall balance assessment: Needs assistance Sitting-balance support: Feet supported;No upper extremity supported Sitting balance-Leahy Scale: Good     Standing balance support: Bilateral upper extremity supported Standing balance-Leahy Scale: Poor Standing balance comment: Static standing at sink for ADL                             Pertinent Vitals/Pain Pain Assessment: 0-10 Pain Score: 5  Faces Pain Scale: Hurts even more Pain Location: L knee Pain Descriptors / Indicators: Aching;Sore Pain Intervention(s): Limited activity within patient's tolerance;Monitored during session;Premedicated before session;Ice applied    Home Living Family/patient expects to be discharged to:: Skilled nursing facility                      Prior Function Level of Independence: Independent               Hand Dominance        Extremity/Trunk Assessment   Upper Extremity Assessment Upper Extremity Assessment: Overall WFL for tasks assessed    Lower Extremity Assessment Lower Extremity Assessment: LLE deficits/detail LLE Deficits / Details: 2/5 quads with AAROM at knee -10 - 80       Communication   Communication: No difficulties  Cognition Arousal/Alertness: Awake/alert Behavior During Therapy: WFL for tasks assessed/performed Overall Cognitive Status: Within Functional Limits for tasks assessed  General Comments      Exercises Total Joint Exercises Ankle Circles/Pumps: AROM;Both;15 reps;Supine Quad Sets: AROM;Both;10 reps;Supine Heel Slides: AAROM;Left;15 reps;Supine Straight Leg Raises: AAROM;Left;10 reps;Supine   Assessment/Plan    PT Assessment Patient needs continued PT services  PT Problem List Decreased strength;Decreased range of motion;Decreased activity tolerance;Decreased balance;Decreased mobility;Decreased knowledge of use of  DME;Obesity;Pain       PT Treatment Interventions DME instruction;Gait training;Stair training;Functional mobility training;Therapeutic activities;Therapeutic exercise;Patient/family education    PT Goals (Current goals can be found in the Care Plan section)  Acute Rehab PT Goals Patient Stated Goal: rehab then home PT Goal Formulation: With patient Time For Goal Achievement: 01/19/17 Potential to Achieve Goals: Good    Frequency 7X/week   Barriers to discharge Decreased caregiver support Home alone    Co-evaluation               AM-PAC PT "6 Clicks" Daily Activity  Outcome Measure Difficulty turning over in bed (including adjusting bedclothes, sheets and blankets)?: Total Difficulty moving from lying on back to sitting on the side of the bed? : Total Difficulty sitting down on and standing up from a chair with arms (e.g., wheelchair, bedside commode, etc,.)?: Total Help needed moving to and from a bed to chair (including a wheelchair)?: A Little Help needed walking in hospital room?: A Little Help needed climbing 3-5 steps with a railing? : A Lot 6 Click Score: 11    End of Session Equipment Utilized During Treatment: Gait belt;Left knee immobilizer Activity Tolerance: Patient tolerated treatment well;Patient limited by fatigue Patient left: in chair;with call bell/phone within reach Nurse Communication: Mobility status PT Visit Diagnosis: Unsteadiness on feet (R26.81);Difficulty in walking, not elsewhere classified (R26.2)    Time: 8099-8338 PT Time Calculation (min) (ACUTE ONLY): 38 min   Charges:   PT Evaluation $PT Eval Low Complexity: 1 Procedure PT Treatments $Therapeutic Exercise: 8-22 mins   PT G Codes:        Pg [phone]    Edan Serratore 01/13/2017, 12:59 PM

## 2017-01-13 NOTE — NC FL2 (Signed)
[place] LEVEL OF CARE SCREENING TOOL     IDENTIFICATION  Patient Name: [name] Birthdate: 1940-09-05 Sex: female Admission Date (Current Location): 01/12/2017  [facility] and [place] Number:  Herbalist and Address:  [facility],  [place] [address] [place]      Provider Number: [id number]  Attending Physician Name and Address:  Susa Day, MD  Relative Name and Phone Number:       Current Level of Care: Hospital Recommended Level of Care: [place] Prior Approval Number:    Date Approved/Denied:   PASRR Number: [id number]  Discharge Plan: SNF    Current Diagnoses: Patient Active Problem List   Diagnosis Date Noted  . Primary osteoarthritis of left knee 01/12/2017  . Left knee DJD 01/12/2017  . Constipation 01/10/2017  . Chest tightness 11/09/2016  . Hypercalcemia 10/12/2016  . Chest pain 10/12/2016  . DOE (dyspnea on exertion) 10/12/2016  . Preop examination 10/12/2016  . Chronic back pain 08/23/2016  . Hair loss 08/23/2016  . Diabetes ([place]) 07/26/2016  . Anxiety 09/14/2015  . Muscle pain, myofacial 05/19/2015  . Lower extremity edema 12/16/2014  . Pelvic cyst   . Diverticulitis large intestine w/o perforation or abscess w/o bleeding 05/12/2014  . Essential hypertension 05/12/2014  . DM neuropathy, type II diabetes mellitus ([place]) 05/12/2014  . OSA on CPAP 05/12/2014  . Depression 05/12/2014  . Seizure disorder ([place]) 05/12/2014  . Abnormal CT scan, pelvis 05/12/2014  . Hepatic steatosis 05/12/2014  . Obesity (BMI 30-39.9) 05/12/2014  . Cerebrovascular disease, unspecified 03/13/2013  . Allergic rhinitis 03/13/2013  . GOITER, MULTINODULAR 05/18/2009  . VERTIGO 04/13/2009  . Irritable bowel syndrome 03/20/2009  . INSOMNIA 03/20/2009  . Hyperlipidemia 03/18/2009  . SLEEP APNEA, OBSTRUCTIVE 03/18/2009  . GERD 03/18/2009  . Osteoarthritis 03/18/2009  . SEIZURE DISORDER 03/18/2009     Orientation RESPIRATION BLADDER Height & Weight     Self, Time, Situation, Place  Normal Continent Weight: 81.6 kg (180 lb) Height:  5\' 4"  (162.6 cm)  BEHAVIORAL SYMPTOMS/MOOD NEUROLOGICAL BOWEL NUTRITION STATUS  Other (Comment) (no behaviors)   Continent Diet  AMBULATORY STATUS COMMUNICATION OF NEEDS Skin   Limited Assist Verbally Surgical wounds                       Personal Care Assistance Level of Assistance  Bathing, Feeding, Dressing Bathing Assistance: Limited assistance Feeding assistance: Independent Dressing Assistance: Limited assistance     Functional Limitations Info  Sight, Hearing, Speech Sight Info: Adequate Hearing Info: Adequate Speech Info: Adequate    SPECIAL CARE FACTORS FREQUENCY  PT (By licensed PT), OT (By licensed OT)     PT Frequency: 5x wk OT Frequency: 5xwk            Contractures Contractures Info: Not present    Additional Factors Info  Allergies, Code Status Code Status Info: Full Code Allergies Info: Cymbalta Duloxetine Hcl, Effexor Venlafaxine, Other, Statins, Penicillins           Current Medications (01/13/2017):  This is the current hospital active medication list Current Facility-Administered Medications  Medication Dose Route Frequency Provider Last Rate Last Dose  . 0.9 % NaCl with KCl 20 mEq/ L  infusion   Intravenous Continuous Susa Day, MD 50 mL/hr at 01/13/17 0445    . acetaminophen (OFIRMEV) IV 1,000 mg  1,000 mg Intravenous Q6H Susa Day, MD   Stopped at 01/13/17 0701  . acetaminophen (TYLENOL) tablet  650 mg  650 mg Oral Q6H PRN Susa Day, MD       Or  . acetaminophen (TYLENOL) suppository 650 mg  650 mg Rectal Q6H PRN Susa Day, MD      . acidophilus (RISAQUAD) capsule 1 capsule  1 capsule Oral Daily Susa Day, MD   1 capsule at 01/13/17 0811  . albuterol (PROVENTIL) (2.5 MG/3ML) 0.083% nebulizer solution 2.5 mg  2.5 mg Nebulization Q6H PRN Susa Day, MD      . aspirin EC  tablet 325 mg  325 mg Oral BID PC Susa Day, MD   325 mg at 01/13/17 3235  . azelastine (ASTELIN) 0.1 % nasal spray 2 spray  2 spray Each Nare BID PRN Susa Day, MD      . bisacodyl (DULCOLAX) EC tablet 5 mg  5 mg Oral Daily PRN Susa Day, MD      . clonazePAM Bobbye [place]) tablet 0.25-0.5 mg  0.25-0.5 mg Oral BID PRN Susa Day, MD      . docusate sodium (COLACE) capsule 100 mg  100 mg Oral BID Susa Day, MD   100 mg at 01/13/17 1005  . famotidine (PEPCID) tablet 10 mg  10 mg Oral BID Susa Day, MD   10 mg at 01/13/17 1004  . gabapentin (NEURONTIN) capsule 100 mg  100 mg Oral TID Susa Day, MD   100 mg at 01/13/17 1006  . gabapentin (NEURONTIN) capsule 800 mg  800 mg Oral TID Susa Day, MD   800 mg at 01/13/17 1006  . hydrochlorothiazide (HYDRODIURIL) tablet 25 mg  25 mg Oral Daily Susa Day, MD   25 mg at 01/13/17 1006  . HYDROmorphone (DILAUDID) injection 0.5 mg  0.5 mg Intravenous Q2H PRN Susa Day, MD   0.5 mg at 01/12/17 2028  . insulin aspart (novoLOG) injection 0-15 Units  0-15 Units Subcutaneous TID WC Susa Day, MD   3 Units at 01/12/17 1731  . lactated ringers infusion   Intravenous Continuous Suzette Battiest, MD      . linaclotide Rolan Lipa) capsule 145 mcg  145 mcg Oral QAC breakfast Susa Day, MD   145 mcg at 01/13/17 [phone]  . magnesium citrate solution 1 Bottle  1 Bottle Oral Once PRN Susa Day, MD      . meclizine (ANTIVERT) tablet 12.5 mg  12.5 mg Oral TID PRN Susa Day, MD      . menthol-cetylpyridinium (CEPACOL) lozenge 3 mg  1 lozenge Oral PRN Susa Day, MD       Or  . phenol (CHLORASEPTIC) mouth spray 1 spray  1 spray Mouth/Throat PRN Susa Day, MD      . metoCLOPramide (REGLAN) tablet 5-10 mg  5-10 mg Oral Q8H PRN Susa Day, MD       Or  . metoCLOPramide (REGLAN) injection 5-10 mg  5-10 mg Intravenous Q8H PRN Susa Day, MD      . naphazoline-glycerin (CLEAR EYES) ophth solution 1-2 drop   1-2 drop Both Eyes TID PRN Susa Day, MD      . ondansetron (ZOFRAN) tablet 4 mg  4 mg Oral Q6H PRN Susa Day, MD       Or  . ondansetron (ZOFRAN) injection 4 mg  4 mg Intravenous Q6H PRN Susa Day, MD      . OXcarbazepine (TRILEPTAL) tablet 150 mg  150 mg Oral Daily Susa Day, MD   150 mg at 01/13/17 1006  . oxyCODONE (Oxy IR/ROXICODONE) immediate release tablet 5-10 mg  5-10 mg Oral Q3H PRN  Susa Day, MD   10 mg at 01/13/17 1004  . polyethylene glycol (MIRALAX / GLYCOLAX) packet 17 g  17 g Oral Daily PRN Susa Day, MD      . potassium chloride SA (K-DUR,KLOR-CON) CR tablet 40 mEq  40 mEq Oral Daily Susa Day, MD   40 mEq at 01/13/17 1006  . traMADol (ULTRAM) tablet 50 mg  50 mg Oral Q6H PRN Susa Day, MD      . vitamin B-12 (CYANOCOBALAMIN) tablet 1,000 mcg  1,000 mcg Oral Daily Susa Day, MD   1,000 mcg at 01/13/17 1005     Discharge Medications: Please see discharge summary for a list of discharge medications.  Relevant Imaging Results:  Relevant Lab Results:   Additional Information SS # [id number]. Pt uses CPAP at night.  Limmie Schoenberg, Randall An, LCSW

## 2017-01-13 NOTE — Progress Notes (Signed)
Subjective: 1 Day Post-Op Procedure(s) (LRB): LEFT TOTAL KNEE ARTHROPLASTY (Left) Patient reports pain as 4 on 0-10 scale.    Objective: Vital signs in last 24 hours: Temp:  [97.5 F (36.4 C)-98.6 F (37 C)] 98.1 F (36.7 C) (07/20 0525) Pulse Rate:  [56-76] 61 (07/20 0525) Resp:  [9-19] 15 (07/20 0525) BP: (108-172)/(55-94) 123/66 (07/20 0525) SpO2:  [94 %-100 %] 100 % (07/20 0525) Weight:  [81.6 kg (180 lb)] 81.6 kg (180 lb) (07/19 0838)  Intake/Output from previous day: 07/19 0701 - 07/20 0700 In: 4526.7 [P.O.:720; I.V.:3256.7; IV Piggyback:550] Out: 7943 [Urine:3300; Blood:20] Intake/Output this shift: No intake/output data recorded.   Recent Labs  01/13/17 0604  HGB 11.6*    Recent Labs  01/13/17 0604  WBC 8.8  RBC 3.85*  HCT 34.1*  PLT 249    Recent Labs  01/13/17 0604  NA 135  K 4.1  CL 99*  CO2 28  BUN 10  CREATININE 0.73  GLUCOSE 133*  CALCIUM 9.7   No results for input(s): LABPT, INR in the last 72 hours.  Neurologically intact Sensation intact distally Dorsiflexion/Plantar flexion intact Incision: dressing C/D/I  Assessment/Plan: 1 Day Post-Op Procedure(s) (LRB): LEFT TOTAL KNEE ARTHROPLASTY (Left) Advance diet Up with therapy D/C IV fluids Discharge to SNF requested. Discussed OR  Chanson Teems C 01/13/2017, 7:45 AM

## 2017-01-14 LAB — GLUCOSE, CAPILLARY
GLUCOSE-CAPILLARY: 211 mg/dL — AB (ref 65–99)
GLUCOSE-CAPILLARY: 196 mg/dL — AB (ref 65–99)
Glucose-Capillary: 156 mg/dL — ABNORMAL HIGH (ref 65–99)
Glucose-Capillary: 163 mg/dL — ABNORMAL HIGH (ref 65–99)

## 2017-01-14 LAB — CBC
HEMATOCRIT: 36.6 % (ref 36.0–46.0)
HEMOGLOBIN: 12.5 g/dL (ref 12.0–15.0)
MCH: 29.9 pg (ref 26.0–34.0)
MCHC: 34.2 g/dL (ref 30.0–36.0)
MCV: 87.6 fL (ref 78.0–100.0)
Platelets: 280 10*3/uL (ref 150–400)
RBC: 4.18 MIL/uL (ref 3.87–5.11)
RDW: 13.3 % (ref 11.5–15.5)
WBC: 10.7 10*3/uL — ABNORMAL HIGH (ref 4.0–10.5)

## 2017-01-14 NOTE — Progress Notes (Signed)
Patient reporting more pain today verus yesterday. Very anxious and restless and reports getting little to no sleep. RN discussed this with Dr. Tonita Cong this morning. Patient also having moderate drainage under Aquacel dressing. Instructions from Dr. Tonita Cong to change it. Several spots noted on incision draining bloody drainage. Replaced some steri-strips and covered with a new Aquacel dressing.

## 2017-01-14 NOTE — Progress Notes (Signed)
Physical Therapy Treatment Patient Details Name: [name] MRN: [id number] DOB: April 29, 1941 Today's Date: 01/14/2017    History of Present Illness Pt is a 76 y.o. female s/p L TKA. PMHx: Scoliosis, DM 1, HTN, Peripheral neuropathy, RA, Sleep Apnea.    PT Comments    Pt cooperative this am but limited by pain/fatigue.  Pt up to [facility] and ambulated short distance to sit up in chair.  Will follow in pm.   Follow Up Recommendations  SNF     Equipment Recommendations  None recommended by PT    Recommendations for Other Services OT consult     Precautions / Restrictions Precautions Precautions: Knee;Fall Precaution Booklet Issued: No Required Braces or Orthoses: Knee Immobilizer - Left Knee Immobilizer - Left: Discontinue once straight leg raise with < 10 degree lag Restrictions Weight Bearing Restrictions: No Other Position/Activity Restrictions: WBAT    Mobility  Bed Mobility Overal bed mobility: Needs Assistance Bed Mobility: Supine to Sit     Supine to sit: Min assist     General bed mobility comments: Increased time with cues for sequence and use of R LE to self assist.  Min assist to manage L LE  Transfers Overall transfer level: Needs assistance Equipment used: Rolling walker (2 wheeled) Transfers: Sit to/from Stand Sit to Stand: Min assist Stand pivot transfers: Min assist       General transfer comment: cues for LE management and use of UEs to self assist  Ambulation/Gait Ambulation/Gait assistance: Min assist Ambulation Distance (Feet): 5 Feet Assistive device: Rolling walker (2 wheeled) Gait Pattern/deviations: Step-to pattern;Decreased step length - right;Decreased step length - left;Shuffle;Trunk flexed Gait velocity: decr Gait velocity interpretation: Below normal speed for age/gender General Gait Details: cues for sequence, posture and position from [employer]            Wheelchair Mobility    Modified Rankin (Stroke Patients  Only)       Balance Overall balance assessment: Needs assistance Sitting-balance support: Feet supported;No upper extremity supported Sitting balance-Leahy Scale: Good     Standing balance support: Bilateral upper extremity supported Standing balance-Leahy Scale: Poor Standing balance comment: Static standing at sink for ADL                            Cognition Arousal/Alertness: Awake/alert Behavior During Therapy: WFL for tasks assessed/performed Overall Cognitive Status: Within Functional Limits for tasks assessed                                        Exercises      General Comments        Pertinent Vitals/Pain Pain Assessment: Faces Faces Pain Scale: Hurts even more Pain Location: L knee Pain Descriptors / Indicators: Aching;Sore Pain Intervention(s): Limited activity within patient's tolerance;Monitored during session;Premedicated before session;Ice applied    Home Living                      Prior Function            PT Goals (current goals can now be found in the care plan section) Acute Rehab PT Goals Patient Stated Goal: rehab then home PT Goal Formulation: With patient Time For Goal Achievement: 01/19/17 Potential to Achieve Goals: Good Progress towards PT goals: Progressing toward goals    Frequency    7X/week  PT Plan Current plan remains appropriate    Co-evaluation              AM-PAC PT "6 Clicks" Daily Activity  Outcome Measure  Difficulty turning over in bed (including adjusting bedclothes, sheets and blankets)?: Total Difficulty moving from lying on back to sitting on the side of the bed? : Total Difficulty sitting down on and standing up from a chair with arms (e.g., wheelchair, bedside commode, etc,.)?: Total Help needed moving to and from a bed to chair (including a wheelchair)?: A Little Help needed walking in hospital room?: A Little Help needed climbing 3-5 steps with a  railing? : A Lot 6 Click Score: 11    End of Session Equipment Utilized During Treatment: Gait belt;Left knee immobilizer Activity Tolerance: Patient tolerated treatment well;Patient limited by fatigue Patient left: with call bell/phone within reach;with bed alarm set;in chair Nurse Communication: Mobility status PT Visit Diagnosis: Unsteadiness on feet (R26.81);Difficulty in walking, not elsewhere classified (R26.2)     Time: 5364-6803 PT Time Calculation (min) (ACUTE ONLY): 26 min  Charges:  $Gait Training: 8-22 mins $Therapeutic Activity: 8-22 mins                    G Codes:       Pg [phone]    Yuuki Skeens 01/14/2017, 10:22 AM

## 2017-01-14 NOTE — Progress Notes (Addendum)
Subjective: 2 Days Post-Op Procedure(s) (LRB): LEFT TOTAL KNEE ARTHROPLASTY (Left) Patient reports pain as 4 on 0-10 scale.    Objective: Vital signs in last 24 hours: Temp:  [98.1 F (36.7 C)-99.9 F (37.7 C)] 99.9 F (37.7 C) (07/21 0631) Pulse Rate:  [58-90] 90 (07/21 0631) Resp:  [16-18] 16 (07/21 0631) BP: (126-193)/(65-90) 166/90 (07/21 0631) SpO2:  [95 %-100 %] 95 % (07/21 0631)  Intake/Output from previous day: 07/20 0701 - 07/21 0700 In: 1429.2 [P.O.:960; I.V.:369.2; IV Piggyback:100] Out: 2150 [Urine:2150] Intake/Output this shift: No intake/output data recorded.   Recent Labs  01/13/17 0604 01/14/17 0457  HGB 11.6* 12.5    Recent Labs  01/13/17 0604 01/14/17 0457  WBC 8.8 10.7*  RBC 3.85* 4.18  HCT 34.1* 36.6  PLT 249 280    Recent Labs  01/13/17 0604  NA 135  K 4.1  CL 99*  CO2 28  BUN 10  CREATININE 0.73  GLUCOSE 133*  CALCIUM 9.7   No results for input(s): LABPT, INR in the last 72 hours.  Neurologically intact ABD soft Neurovascular intact Sensation intact distally Dorsiflexion/Plantar flexion intact Incision: scant drainage No DVT  Assessment/Plan: 2 Days Post-Op Procedure(s) (LRB): LEFT TOTAL KNEE ARTHROPLASTY (Left) Advance diet Up with therapy D/C IV fluids Discharge to SNF when bed avail Dressing change  Britainy Kozub C 01/14/2017, 8:00 AM

## 2017-01-14 NOTE — Progress Notes (Signed)
Physical Therapy Treatment Patient Details Name: [name] MRN: [id number] DOB: 04/02/1941 Today's Date: 01/14/2017    History of Present Illness Pt is a 76 y.o. female s/p L TKA. PMHx: Scoliosis, DM 1, HTN, Peripheral neuropathy, RA, Sleep Apnea.    PT Comments    Pt progressing slowly 2* increased pain/fatigue.   Follow Up Recommendations  SNF     Equipment Recommendations  None recommended by PT    Recommendations for Other Services OT consult     Precautions / Restrictions Precautions Precautions: Knee;Fall Precaution Booklet Issued: No Required Braces or Orthoses: Knee Immobilizer - Left Knee Immobilizer - Left: Discontinue once straight leg raise with < 10 degree lag Restrictions Weight Bearing Restrictions: No Other Position/Activity Restrictions: WBAT    Mobility  Bed Mobility Overal bed mobility: Needs Assistance Bed Mobility: Sit to Supine       Sit to supine: Min assist   General bed mobility comments: Increased time with cues for sequence and use of R LE to self assist.  Min assist to manage L LE  Transfers Overall transfer level: Needs assistance Equipment used: Rolling walker (2 wheeled) Transfers: Sit to/from Stand Sit to Stand: Min assist Stand pivot transfers: Min assist       General transfer comment: cues for LE management and use of UEs to self assist.  Stand pvt chair to [facility]  Ambulation/Gait Ambulation/Gait assistance: Min assist Ambulation Distance (Feet): 38 Feet Assistive device: Rolling walker (2 wheeled) Gait Pattern/deviations: Step-to pattern;Decreased step length - right;Decreased step length - left;Shuffle;Trunk flexed Gait velocity: decr Gait velocity interpretation: Below normal speed for age/gender General Gait Details: cues for sequence, posture and position from [employer]            Wheelchair Mobility    Modified Rankin (Stroke Patients Only)       Balance Overall balance assessment: Needs  assistance Sitting-balance support: Feet supported;No upper extremity supported Sitting balance-Leahy Scale: Good     Standing balance support: Bilateral upper extremity supported Standing balance-Leahy Scale: Poor Standing balance comment: Static standing at sink for ADL                            Cognition Arousal/Alertness: Awake/alert Behavior During Therapy: WFL for tasks assessed/performed Overall Cognitive Status: Within Functional Limits for tasks assessed                                        Exercises Total Joint Exercises Ankle Circles/Pumps: AROM;Both;15 reps;Supine    General Comments        Pertinent Vitals/Pain Pain Assessment: Faces Faces Pain Scale: Hurts even more Pain Location: L knee Pain Descriptors / Indicators: Aching;Sore Pain Intervention(s): Limited activity within patient's tolerance;Monitored during session;Premedicated before session;Ice applied    Home Living                      Prior Function            PT Goals (current goals can now be found in the care plan section) Acute Rehab PT Goals Patient Stated Goal: rehab then home PT Goal Formulation: With patient Time For Goal Achievement: 01/19/17 Potential to Achieve Goals: Good Progress towards PT goals: Progressing toward goals    Frequency    7X/week      PT Plan Current plan remains appropriate  Co-evaluation              AM-PAC PT "6 Clicks" Daily Activity  Outcome Measure  Difficulty turning over in bed (including adjusting bedclothes, sheets and blankets)?: Total Difficulty moving from lying on back to sitting on the side of the bed? : Total Difficulty sitting down on and standing up from a chair with arms (e.g., wheelchair, bedside commode, etc,.)?: Total Help needed moving to and from a bed to chair (including a wheelchair)?: A Little Help needed walking in hospital room?: A Little Help needed climbing 3-5 steps with a  railing? : A Lot 6 Click Score: 11    End of Session Equipment Utilized During Treatment: Gait belt;Left knee immobilizer Activity Tolerance: Patient tolerated treatment well;Patient limited by fatigue Patient left: with call bell/phone within reach;with bed alarm set;in chair Nurse Communication: Mobility status PT Visit Diagnosis: Unsteadiness on feet (R26.81);Difficulty in walking, not elsewhere classified (R26.2)     Time: 7564-3329 PT Time Calculation (min) (ACUTE ONLY): 37 min  Charges:  $Gait Training: 8-22 mins $Therapeutic Activity: 8-22 mins                    G Codes:       Pg [phone]    Devynn Scheff 01/14/2017, 3:31 PM

## 2017-01-14 NOTE — Clinical Social Work Placement (Signed)
   CLINICAL SOCIAL WORK PLACEMENT  NOTE  Date:  01/14/2017  Patient Details  Name: MAKALEIGH REINARD MRN: [id number] Date of Birth: 1940-09-14  Clinical Social Work is seeking post-discharge placement for this patient at the [place] level of care (*CSW will initial, date and re-position this form in  chart as items are completed):  Yes   Patient/family provided with [facility] Work Department's list of facilities offering this level of care within the geographic area requested by the patient (or if unable, by the patient's family).  Yes   Patient/family informed of their freedom to choose among providers that offer the needed level of care, that participate in Medicare, Medicaid or managed care program needed by the patient, have an available bed and are willing to accept the patient.  Yes   Patient/family informed of [facility]'s ownership interest in [facility] and [facility], as well as of the fact that they are under no obligation to receive care at these facilities.  PASRR submitted to EDS on       PASRR number received on       Existing PASRR number confirmed on 01/13/17     FL2 transmitted to all facilities in geographic area requested by pt/family on 01/13/17     FL2 transmitted to all facilities within larger geographic area on       Patient informed that his/her managed care company has contracts with or will negotiate with certain facilities, including the following:        Yes   Patient/family informed of bed offers received.  Patient chooses bed at [facility]     Physician recommends and patient chooses bed at      Patient to be transferred to   on  .  Patient to be transferred to facility by Ambulance [facility])     Patient family notified on   of transfer.  Name of family member notified:        PHYSICIAN Please prepare priority discharge summary, including medications, Please prepare prescriptions     Additional  Comment:    _______________________________________________ Williemae Area, LCSW 01/14/2017, 9:08 AM
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
## 2017-01-15 LAB — CBC
HEMATOCRIT: 36.9 % (ref 36.0–46.0)
Hemoglobin: 12.9 g/dL (ref 12.0–15.0)
MCH: 30.4 pg (ref 26.0–34.0)
MCHC: 35 g/dL (ref 30.0–36.0)
MCV: 87 fL (ref 78.0–100.0)
PLATELETS: 251 10*3/uL (ref 150–400)
RBC: 4.24 MIL/uL (ref 3.87–5.11)
RDW: 13.4 % (ref 11.5–15.5)
WBC: 10.2 10*3/uL (ref 4.0–10.5)

## 2017-01-15 LAB — GLUCOSE, CAPILLARY
GLUCOSE-CAPILLARY: 165 mg/dL — AB (ref 65–99)
Glucose-Capillary: 192 mg/dL — ABNORMAL HIGH (ref 65–99)
Glucose-Capillary: 151 mg/dL — ABNORMAL HIGH (ref 65–99)
Glucose-Capillary: 179 mg/dL — ABNORMAL HIGH (ref 65–99)

## 2017-01-15 NOTE — Progress Notes (Signed)
CSW following to facilitate discharge to SNF when appropriate. CSW contacted [facility] to discuss discharge to facility. Freda Munro at [facility] requested information on the patient's CPAP at night and what settings the patient needed. CSW alerted MD and coordinated to obtain information and have it added to patient's discharge summary.  [facility] is unable to obtain the patient's CPAP equipment for today; will not be able to admit patient to SNF until tomorrow. MD notified of barrier to discharge, and will coordinate with week-day CSW tomorrow in order to determine that [facility] has all information needed to obtain patient's medical equipment.  Laveda Abbe, [place] Clinical Social Worker [phone]

## 2017-01-15 NOTE — Progress Notes (Signed)
Subjective: 3 Days Post-Op Procedure(s) (LRB): LEFT TOTAL KNEE ARTHROPLASTY (Left) Patient reports pain as 1 on 0-10 scale.Doing very well today. Waiting for SNF.HBG 12.9.    Objective: Vital signs in last 24 hours: Temp:  [98.9 F (37.2 C)-99.5 F (37.5 C)] 99.5 F (37.5 C) (07/22 0429) Pulse Rate:  [77-97] 97 (07/22 0429) Resp:  [15-16] 16 (07/22 0429) BP: (158-182)/(86-97) 158/91 (07/22 0604) SpO2:  [97 %-100 %] 100 % (07/22 0429)  Intake/Output from previous day: 07/21 0701 - 07/22 0700 In: 680 [P.O.:680] Out: 2700 [Urine:2700] Intake/Output this shift: Total I/O In: 240 [P.O.:240] Out: -    Recent Labs  01/13/17 0604 01/14/17 0457 01/15/17 0531  HGB 11.6* 12.5 12.9    Recent Labs  01/14/17 0457 01/15/17 0531  WBC 10.7* 10.2  RBC 4.18 4.24  HCT 36.6 36.9  PLT 280 251    Recent Labs  01/13/17 0604  NA 135  K 4.1  CL 99*  CO2 28  BUN 10  CREATININE 0.73  GLUCOSE 133*  CALCIUM 9.7   No results for input(s): LABPT, INR in the last 72 hours.  Neurologically intact Dorsiflexion/Plantar flexion intact  Assessment/Plan: 3 Days Post-Op Procedure(s) (LRB): LEFT TOTAL KNEE ARTHROPLASTY (Left) Up with therapy Discharge to SNF  [name] A 01/15/2017, 9:25 AM

## 2017-01-15 NOTE — Progress Notes (Signed)
Physical Therapy Treatment Patient Details Name: [name] MRN: [id number] DOB: [birth year] Today's Date: 01/15/2017    History of Present Illness Pt is a 76 y.o. female s/p L TKA. PMHx: Scoliosis, DM 1, HTN, Peripheral neuropathy, RA, Sleep Apnea.    PT Comments    Marked improvement in activity tolerance and performance of all mobility tasks.  Follow Up Recommendations  SNF     Equipment Recommendations  None recommended by PT    Recommendations for Other Services OT consult     Precautions / Restrictions Precautions Precautions: Knee;Fall Precaution Booklet Issued: No Required Braces or Orthoses: Knee Immobilizer - Left Knee Immobilizer - Left: Discontinue once straight leg raise with < 10 degree lag Restrictions Weight Bearing Restrictions: No Other Position/Activity Restrictions: WBAT    Mobility  Bed Mobility Overal bed mobility: Needs Assistance Bed Mobility: Supine to Sit     Supine to sit: Min assist     General bed mobility comments: Increased time with cues for sequence and use of R LE to self assist.  Min assist to manage L LE  Transfers Overall transfer level: Needs assistance Equipment used: Rolling walker (2 wheeled) Transfers: Sit to/from Stand Sit to Stand: Min assist         General transfer comment: cues for LE management and use of UEs to self assist.  Stand pvt chair to [facility]  Ambulation/Gait Ambulation/Gait assistance: Min assist Ambulation Distance (Feet): 123 Feet Assistive device: Rolling walker (2 wheeled) Gait Pattern/deviations: Step-to pattern;Decreased step length - right;Decreased step length - left;Shuffle;Trunk flexed Gait velocity: decr Gait velocity interpretation: Below normal speed for age/gender General Gait Details: Increased time with cues for sequence, posture and position from [employer]            Wheelchair Mobility    Modified Rankin (Stroke Patients Only)       Balance                                             Cognition Arousal/Alertness: Awake/alert Behavior During Therapy: WFL for tasks assessed/performed Overall Cognitive Status: Within Functional Limits for tasks assessed                                        Exercises Total Joint Exercises Ankle Circles/Pumps: AROM;Both;15 reps;Supine Quad Sets: AROM;Both;10 reps;Supine Heel Slides: AAROM;Left;15 reps;Supine Straight Leg Raises: AAROM;Left;10 reps;Supine Goniometric ROM: AAROM L knee -5 - 40    General Comments        Pertinent Vitals/Pain Pain Assessment: Faces Pain Score: 4  Pain Location: L knee Pain Descriptors / Indicators: Aching;Sore Pain Intervention(s): Limited activity within patient's tolerance;Monitored during session;Premedicated before session;Ice applied    Home Living                      Prior Function            PT Goals (current goals can now be found in the care plan section) Acute Rehab PT Goals Patient Stated Goal: rehab then home PT Goal Formulation: With patient Time For Goal Achievement: 01/19/17 Potential to Achieve Goals: Good Progress towards PT goals: Progressing toward goals    Frequency    7X/week      PT Plan Current plan remains appropriate  Co-evaluation              AM-PAC PT "6 Clicks" Daily Activity  Outcome Measure  Difficulty turning over in bed (including adjusting bedclothes, sheets and blankets)?: Total Difficulty moving from lying on back to sitting on the side of the bed? : Total Difficulty sitting down on and standing up from a chair with arms (e.g., wheelchair, bedside commode, etc,.)?: Total Help needed moving to and from a bed to chair (including a wheelchair)?: A Little Help needed walking in hospital room?: A Little Help needed climbing 3-5 steps with a railing? : A Lot 6 Click Score: 11    End of Session Equipment Utilized During Treatment: Gait belt;Left knee  immobilizer Activity Tolerance: Patient tolerated treatment well;Patient limited by fatigue Patient left: Other (comment) (bathroom - CNA aware) Nurse Communication: Mobility status PT Visit Diagnosis: Unsteadiness on feet (R26.81);Difficulty in walking, not elsewhere classified (R26.2)     Time: 1125-1206 PT Time Calculation (min) (ACUTE ONLY): 41 min  Charges:  $Gait Training: 23-37 mins $Therapeutic Exercise: 8-22 mins                    G Codes:       Pg [phone]    Keelon Zurn 01/15/2017, 12:49 PM

## 2017-01-15 NOTE — Discharge Summary (Addendum)
Physician Discharge Summary   Patient ID: [name] MRN: [id number] DOB/AGE: 74-Jun-1942 74 y.o.  Admit date: 01/12/2017 Discharge date: 01/16/2017  Primary Diagnosis: Primary osteoarthritis left knee  Admission Diagnoses:  Past Medical History:  Diagnosis Date  . Allergic rhinitis, cause unspecified 03/13/2013  . Anxiety   . Cerebrovascular disease, unspecified 03/13/2013   Atrophy and small vessel dz noted, MR brain 2009  . Depression   . Diabetes mellitus, type 2 ([place])   . Diverticulosis of colon 03/2010 hosp  . Dyslipidemia   . GERD (gastroesophageal reflux disease)   . Hypertension   . Neuropathy   . OSA on CPAP   . Osteoarthritis of shoulder region    and Knee  . Seizure disorder ([place])    onset 11 years ago; repeated 2013   Discharge Diagnoses:   Principal Problem:   Primary osteoarthritis of left knee Active Problems:   Left knee DJD  Estimated body mass index is 30.9 kg/m as calculated from the following:   Height as of this encounter: '5\' 4"'  (1.626 m).   Weight as of this encounter: 81.6 kg (180 lb).  Procedure:  Procedure(s) (LRB): LEFT TOTAL KNEE ARTHROPLASTY (Left)   Consults: None  HPI: The patient is a 76 year old female who comes in today for a preoperative History and Physical. The patient is scheduled for a left total knee arthroplasty to be performed by Dr. Johnn Hai, MD at [facility] on 01/12/2017 . Please see the hospital record for complete dictated history and physical. Note for "H & P": Dr. Tonita Cong and the patient mutually agreed to proceed with a total knee replacement. Risks and benefits of the procedure were discussed including stiffness, suboptimal range of motion, persistent pain, infection requiring removal of prosthesis and reinsertion, need for prophylactic antibiotics in the future, for example, dental procedures, possible need for manipulation, revision in the future and also anesthetic complications including DVT, PE, etc.  We discussed the perioperative course, time in the hospital, postoperative recovery and the need for elevation to control swelling. We also discussed the predicted range of motion and the probability that squatting and kneeling would be unobtainable in the future. In addition, postoperative anticoagulation was discussed. We have obtained preoperative medical clearance as necessary. Provided illustrated handout and discussed it in detail. They will enroll in the total joint replacement educational forum at the hospital.  Laboratory Data: Admission on 01/12/2017  Component Date Value Ref Range Status  . Glucose-Capillary 01/12/2017 93  65 - 99 mg/dL Final  . Comment 1 01/12/2017 Notify RN   Final  . Glucose-Capillary 01/12/2017 109* 65 - 99 mg/dL Final  . Comment 1 01/12/2017 Notify RN   Final  . Comment 2 01/12/2017 Document in Chart   Final  . WBC 01/13/2017 8.8  4.0 - 10.5 K/uL Final  . RBC 01/13/2017 3.85* 3.87 - 5.11 MIL/uL Final  . Hemoglobin 01/13/2017 11.6* 12.0 - 15.0 g/dL Final  . HCT 01/13/2017 34.1* 36.0 - 46.0 % Final  . MCV 01/13/2017 88.6  78.0 - 100.0 fL Final  . MCH 01/13/2017 30.1  26.0 - 34.0 pg Final  . MCHC 01/13/2017 34.0  30.0 - 36.0 g/dL Final  . RDW 01/13/2017 13.6  11.5 - 15.5 % Final  . Platelets 01/13/2017 249  150 - 400 K/uL Final  . Sodium 01/13/2017 135  135 - 145 mmol/L Final  . Potassium 01/13/2017 4.1  3.5 - 5.1 mmol/L Final  . Chloride 01/13/2017 99* 101 -  111 mmol/L Final  . CO2 01/13/2017 28  22 - 32 mmol/L Final  . Glucose, Bld 01/13/2017 133* 65 - 99 mg/dL Final  . BUN 01/13/2017 10  6 - 20 mg/dL Final  . Creatinine, Ser 01/13/2017 0.73  0.44 - 1.00 mg/dL Final  . Calcium 01/13/2017 9.7  8.9 - 10.3 mg/dL Final  . GFR calc non Af Amer 01/13/2017 >60  >60 mL/min Final  . GFR calc Af Amer 01/13/2017 >60  >60 mL/min Final   Comment: (NOTE) The eGFR has been calculated using the CKD EPI equation. This calculation has not been validated in all clinical  situations. eGFR's persistently <60 mL/min signify possible Chronic Kidney Disease.   . Anion gap 01/13/2017 8  5 - 15 Final  . Glucose-Capillary 01/12/2017 172* 65 - 99 mg/dL Final  . Comment 1 01/12/2017 Notify RN   Final  . Comment 2 01/12/2017 Document in Chart   Final  . Glucose-Capillary 01/12/2017 192* 65 - 99 mg/dL Final  . Glucose-Capillary 01/13/2017 111* 65 - 99 mg/dL Final  . Glucose-Capillary 01/13/2017 117* 65 - 99 mg/dL Final  . WBC 01/14/2017 10.7* 4.0 - 10.5 K/uL Final  . RBC 01/14/2017 4.18  3.87 - 5.11 MIL/uL Final  . Hemoglobin 01/14/2017 12.5  12.0 - 15.0 g/dL Final  . HCT 01/14/2017 36.6  36.0 - 46.0 % Final  . MCV 01/14/2017 87.6  78.0 - 100.0 fL Final  . MCH 01/14/2017 29.9  26.0 - 34.0 pg Final  . MCHC 01/14/2017 34.2  30.0 - 36.0 g/dL Final  . RDW 01/14/2017 13.3  11.5 - 15.5 % Final  . Platelets 01/14/2017 280  150 - 400 K/uL Final  . Glucose-Capillary 01/13/2017 156* 65 - 99 mg/dL Final  . Glucose-Capillary 01/13/2017 204* 65 - 99 mg/dL Final  . Glucose-Capillary 01/14/2017 211* 65 - 99 mg/dL Final  . Glucose-Capillary 01/14/2017 156* 65 - 99 mg/dL Final  . WBC 01/15/2017 10.2  4.0 - 10.5 K/uL Final  . RBC 01/15/2017 4.24  3.87 - 5.11 MIL/uL Final  . Hemoglobin 01/15/2017 12.9  12.0 - 15.0 g/dL Final  . HCT 01/15/2017 36.9  36.0 - 46.0 % Final  . MCV 01/15/2017 87.0  78.0 - 100.0 fL Final  . MCH 01/15/2017 30.4  26.0 - 34.0 pg Final  . MCHC 01/15/2017 35.0  30.0 - 36.0 g/dL Final  . RDW 01/15/2017 13.4  11.5 - 15.5 % Final  . Platelets 01/15/2017 251  150 - 400 K/uL Final  . Glucose-Capillary 01/14/2017 163* 65 - 99 mg/dL Final  . Glucose-Capillary 01/14/2017 196* 65 - 99 mg/dL Final  . Glucose-Capillary 01/15/2017 192* 65 - 99 mg/dL Final  Hospital Outpatient Visit on 01/03/2017  Component Date Value Ref Range Status  . Glucose-Capillary 01/03/2017 139* 65 - 99 mg/dL Final  . Hgb A1c MFr Bld 01/03/2017 6.7* 4.8 - 5.6 % Final   Comment: (NOTE)          Pre-diabetes: 5.7 - 6.4         Diabetes: >6.4         Glycemic control for adults with diabetes: <7.0   . Mean Plasma Glucose 01/03/2017 146  mg/dL Final   Comment: (NOTE) Performed At: [facility] [place], [place] [id number] Lindon Romp MD YC:[id number]   . aPTT 01/03/2017 33  24 - 36 seconds Final  . Sodium 01/03/2017 135  135 - 145 mmol/L Final  . Potassium 01/03/2017 4.4  3.5 - 5.1 mmol/L  Final  . Chloride 01/03/2017 94* 101 - 111 mmol/L Final  . CO2 01/03/2017 32  22 - 32 mmol/L Final  . Glucose, Bld 01/03/2017 106* 65 - 99 mg/dL Final  . BUN 01/03/2017 11  6 - 20 mg/dL Final  . Creatinine, Ser 01/03/2017 0.74  0.44 - 1.00 mg/dL Final  . Calcium 01/03/2017 10.2  8.9 - 10.3 mg/dL Final  . GFR calc non Af Amer 01/03/2017 >60  >60 mL/min Final  . GFR calc Af Amer 01/03/2017 >60  >60 mL/min Final   Comment: (NOTE) The eGFR has been calculated using the CKD EPI equation. This calculation has not been validated in all clinical situations. eGFR's persistently <60 mL/min signify possible Chronic Kidney Disease.   . Anion gap 01/03/2017 9  5 - 15 Final  . WBC 01/03/2017 8.5  4.0 - 10.5 K/uL Final  . RBC 01/03/2017 4.38  3.87 - 5.11 MIL/uL Final  . Hemoglobin 01/03/2017 13.2  12.0 - 15.0 g/dL Final  . HCT 01/03/2017 38.7  36.0 - 46.0 % Final  . MCV 01/03/2017 88.4  78.0 - 100.0 fL Final  . MCH 01/03/2017 30.1  26.0 - 34.0 pg Final  . MCHC 01/03/2017 34.1  30.0 - 36.0 g/dL Final  . RDW 01/03/2017 13.1  11.5 - 15.5 % Final  . Platelets 01/03/2017 296  150 - 400 K/uL Final  . Prothrombin Time 01/03/2017 13.2  11.4 - 15.2 seconds Final  . INR 01/03/2017 1.00   Final  . ABO/RH(D) 01/03/2017 O POS   Final  . Antibody Screen 01/03/2017 NEG   Final  . Sample Expiration 01/03/2017 01/15/2017   Final  . Extend sample reason 01/03/2017 NO TRANSFUSIONS OR PREGNANCY IN THE PAST 3 MONTHS   Final  . Color, Urine 01/03/2017 YELLOW  YELLOW Final  . APPearance  01/03/2017 CLEAR  CLEAR Final  . Specific Gravity, Urine 01/03/2017 1.012  1.005 - 1.030 Final  . pH 01/03/2017 6.0  5.0 - 8.0 Final  . Glucose, UA 01/03/2017 NEGATIVE  NEGATIVE mg/dL Final  . Hgb urine dipstick 01/03/2017 NEGATIVE  NEGATIVE Final  . Bilirubin Urine 01/03/2017 NEGATIVE  NEGATIVE Final  . Ketones, ur 01/03/2017 NEGATIVE  NEGATIVE mg/dL Final  . Protein, ur 01/03/2017 NEGATIVE  NEGATIVE mg/dL Final  . Nitrite 01/03/2017 NEGATIVE  NEGATIVE Final  . Leukocytes, UA 01/03/2017 NEGATIVE  NEGATIVE Final  . MRSA, PCR 01/03/2017 NEGATIVE  NEGATIVE Final  . Staphylococcus aureus 01/03/2017 NEGATIVE  NEGATIVE Final   Comment:        The Xpert SA Assay (FDA approved for NASAL specimens in patients over 75 years of age), is one component of a comprehensive surveillance program.  Test performance has been validated by [facility] for patients greater than or equal to 18 year old. It is not intended to diagnose infection nor to guide or monitor treatment.      X-Rays:Dg Knee Left Port  Result Date: 01/12/2017 CLINICAL DATA:  Left knee postop. EXAM: PORTABLE LEFT KNEE - 1-2 VIEW COMPARISON:  12/30/2015 . FINDINGS: Patient is status post total left knee replacement. Anatomic alignment . Hardware intact. No evidence of fracture. No acute abnormality. IMPRESSION: Total left knee replacement. Anatomic alignment. No acute abnormality. Electronically Signed   By: Marcello Moores  Register   On: 01/12/2017 12:44    EKG: Orders placed or performed in visit on 11/09/16  . EKG 12-Lead     Hospital Course: KAJAH SANTIZO is a 76 y.o. who was admitted to [facility]. They were brought to the operating room on 01/12/2017 and underwent Procedure(s): LEFT TOTAL KNEE ARTHROPLASTY.  Patient tolerated the procedure well and was later transferred to the recovery room and then to the orthopaedic floor for postoperative care.  They were given PO and IV analgesics for pain control following their  surgery.  They were given 24 hours of postoperative antibiotics of  Anti-infectives    Start     Dose/Rate Route Frequency Ordered Stop   01/12/17 2200  vancomycin (VANCOCIN) 1,250 mg in sodium chloride 0.9 % 250 mL IVPB    Comments:  Per pharmacy   1,250 mg 166.7 mL/hr over 90 Minutes Intravenous Every 12 hours 01/12/17 1350 01/12/17 2316   01/12/17 1115  polymyxin B 500,000 Units, bacitracin 50,000 Units in sodium chloride 0.9 % 500 mL irrigation  Status:  Discontinued       As needed 01/12/17 1115 01/12/17 1203   01/12/17 0815  vancomycin (VANCOCIN) IVPB 1000 mg/200 mL premix     1,000 mg 200 mL/hr over 60 Minutes Intravenous On call to O.R. 01/12/17 0809 01/12/17 1045     and started on DVT prophylaxis in the form of Aspirin 361m BID.   PT and OT were ordered for total joint protocol.  Discharge planning consulted to help with postop disposition and equipment needs.  Patient had a fair night on the evening of surgery.  They started to get up OOB with therapy on day one.  Continued to work with therapy into day two. By day three, plan was for DC to SNF when bed available pending progress with PT.    Diet: Cardiac diet and Diabetic diet Activity:WBAT Follow-up:in 2 weeks Disposition - Home Discharged Condition: stable    Allergies as of 01/15/2017      Reactions   Cymbalta [duloxetine Hcl]    Hives and itching   Effexor [venlafaxine] Nausea Only   Other    "SEEDED" food due to stomach issues   Statins Nausea And Vomiting   Penicillins Hives, Rash   Has patient had a PCN reaction causing immediate rash, facial/tongue/throat swelling, SOB or lightheadedness with hypotension: Yes Has patient had a PCN reaction causing severe rash involving mucus membranes or skin necrosis: No Has patient had a PCN reaction that required hospitalization: No Has patient had a PCN reaction occurring within the last 10 years: Yes If all of the above answers are "NO", then may proceed with  Cephalosporin use.      Medication List    STOP taking these medications   aspirin 81 MG tablet Replaced by:  aspirin EC 325 MG tablet   polyethylene glycol powder powder Commonly known as:  GLYCOLAX/MIRALAX Replaced by:  polyethylene glycol packet   vitamin B-12 1000 MCG tablet Commonly known as:  CYANOCOBALAMIN     TAKE these medications   ACCU-CHEK AVIVA PLUS test strip Generic drug:  glucose blood USE TO CHECK BLOOD SUGARS  TWO TIMES DAILY   ACCU-CHEK AVIVA PLUS w/Device Kit 1 kit by Does not apply route once.   aspirin EC 325 MG tablet Take 1 tablet (325 mg total) by mouth 2 (two) times daily. Replaces:  aspirin 81 MG tablet   azelastine 0.1 % nasal spray Commonly known as:  ASTELIN Place 2 sprays into both nostrils 2 (two) times daily as needed for rhinitis. Use in each nostril as directed   BEANO PO Take 1-2 tablets by mouth daily as needed (for gas).   Biotin 5000 MCG Tabs Take 5,000  mcg by mouth every other day.   CLEAR EYES MAXIMUM ITCHY EYE OP Place 1 drop into both eyes every 8 (eight) hours as needed (itchy eyes).   clonazePAM 0.5 MG tablet Commonly known as:  KLONOPIN Take 0.5-1 tablets (0.25-0.5 mg total) by mouth 2 (two) times daily as needed for anxiety.   cyclobenzaprine 5 MG tablet Commonly known as:  FLEXERIL TAKE 1 TABLET (5 MG TOTAL) BY MOUTH 3 (THREE) TIMES DAILY AS NEEDED FOR MUSCLE SPASMS.   docusate sodium 100 MG capsule Commonly known as:  COLACE Take 1 capsule (100 mg total) by mouth 2 (two) times daily as needed for mild constipation.   EQL VITAMIN D3 1000 units tablet Generic drug:  Cholecalciferol Take 1,000 Units by mouth every other day.   famotidine 20 MG tablet Commonly known as:  PEPCID TAKE 1 TABLET (20 MG TOTAL) BY MOUTH DAILY. What changed:  See the new instructions.   gabapentin 800 MG tablet Commonly known as:  NEURONTIN TAKE 1 TABLET BY MOUTH 3  TIMES DAILY   gabapentin 100 MG capsule Commonly known as:   NEURONTIN Take 1 capsule (100 mg total) by mouth 3 (three) times daily. In addition to 800 mg TID for total of 900 mg TID   glipiZIDE 5 MG 24 hr tablet Commonly known as:  GLUCOTROL XL Take 5 mg by mouth daily with supper.   hydrochlorothiazide 25 MG tablet Commonly known as:  HYDRODIURIL Take 1 tablet (25 mg total) by mouth daily.   linaclotide 145 MCG Caps capsule Commonly known as:  LINZESS Take 1 capsule (145 mcg total) by mouth daily before breakfast.   meclizine 12.5 MG tablet Commonly known as:  ANTIVERT Take 1 tablet (12.5 mg total) by mouth 3 (three) times daily as needed for dizziness.   metFORMIN 500 MG 24 hr tablet Commonly known as:  GLUCOPHAGE-XR TAKE 1 TABLET (500 MG TOTAL) BY MOUTH DAILY WITH BREAKFAST.   multivitamin capsule Take 1 capsule by mouth every other day.   OXcarbazepine 150 MG tablet Commonly known as:  TRILEPTAL TAKE 1.5 TABLETS (225 MG TOTAL) BY MOUTH 2 (TWO) TIMES DAILY.   polyethylene glycol packet Commonly known as:  MIRALAX / GLYCOLAX Take 17 g by mouth daily. Replaces:  polyethylene glycol powder powder   potassium chloride SA 20 MEQ tablet Commonly known as:  KLOR-CON M20 Take 2 tablets (40 mEq total) by mouth daily. What changed:  how much to take  when to take this   PROAIR HFA 108 (90 Base) MCG/ACT inhaler Generic drug:  albuterol Inhale 2 puffs into the lungs every 6 (six) hours as needed for wheezing or shortness of breath.   PROBIOTIC PO Take 1 capsule by mouth daily after breakfast.   ranitidine 150 MG tablet Commonly known as:  ZANTAC Take 1 tablet (150 mg total) by mouth 2 (two) times daily. What changed:  when to take this  reasons to take this   traMADol 50 MG tablet Commonly known as:  ULTRAM Take 1 tablet (50 mg total) by mouth every 6 (six) hours as needed. What changed:  when to take this       Follow-up Information    Susa Day, MD Follow up in 2 week(s).   Specialty:  Orthopedic  Surgery Contact information: [address] [place] [address] [phone]        Susa Day, MD Follow up in 2 week(s).   Specialty:  Orthopedic Surgery Contact information: [address]  [place] [address] [phone]           Signed: Lacie Draft, PA-C Orthopaedic Surgery 01/15/2017, 9:26 AM

## 2017-01-15 NOTE — Progress Notes (Signed)
Pt. decline CPAP for this evening.

## 2017-01-16 LAB — GLUCOSE, CAPILLARY: Glucose-Capillary: 183 mg/dL — ABNORMAL HIGH (ref 65–99)

## 2017-01-16 NOTE — Care Management Important Message (Signed)
Important Message  Patient Details  Name: [name] MRN: [id number] Date of Birth: 1940/09/18   Medicare Important Message Given:  Yes    Kerin Salen 01/16/2017, 10:24 AMImportant Message  Patient Details  Name: [name] MRN: [id number] Date of Birth: 19-Jan-1941   Medicare Important Message Given:  Yes    Kerin Salen 01/16/2017, 10:24 AM

## 2017-01-16 NOTE — Progress Notes (Signed)
Updated D/C summary faxed.  Patient agreeable to transport by [facility].  Nurse given transport packet, number for report.  [facility] pick up, 10:15am No other needs identified.   Kathrin Greathouse, Latanya Presser, MSW Clinical Social Worker 5E and Psychiatric Service Line [phone] 01/16/2017  9:29 AM

## 2017-01-16 NOTE — Progress Notes (Signed)
Subjective: 4 Days Post-Op Procedure(s) (LRB): LEFT TOTAL KNEE ARTHROPLASTY (Left) Patient reports pain as moderate. Reports some slight numbness lateral L knee. No other c/o.  Objective: Vital signs in last 24 hours: Temp:  [97.7 F (36.5 C)-99.2 F (37.3 C)] 99.2 F (37.3 C) (07/23 0500) Pulse Rate:  [100-120] 103 (07/23 0500) Resp:  [18-20] 18 (07/23 0500) BP: (128-142)/(65-79) 128/65 (07/23 0500) SpO2:  [94 %-100 %] 94 % (07/23 0500)  Intake/Output from previous day: 07/22 0701 - 07/23 0700 In: 720 [P.O.:720] Out: -  Intake/Output this shift: No intake/output data recorded.   Recent Labs  01/14/17 0457 01/15/17 0531  HGB 12.5 12.9    Recent Labs  01/14/17 0457 01/15/17 0531  WBC 10.7* 10.2  RBC 4.18 4.24  HCT 36.6 36.9  PLT 280 251   No results for input(s): NA, K, CL, CO2, BUN, CREATININE, GLUCOSE, CALCIUM in the last 72 hours. No results for input(s): LABPT, INR in the last 72 hours.  Neurologically intact ABD soft Neurovascular intact Sensation intact distally Intact pulses distally Dorsiflexion/Plantar flexion intact Incision: dressing C/D/I and no drainage No cellulitis present Compartment soft no sign of DVT  Assessment/Plan: 4 Days Post-Op Procedure(s) (LRB): LEFT TOTAL KNEE ARTHROPLASTY (Left) Advance diet Up with therapy D/C IV fluids  Plan D/C to SNF for short-term rehab, awaiting final approval/availability Has not used CPAP throughout this stay and only uses occasionally at home Will discuss with Dr. Mliss Fritz, Conley Rolls. 01/16/2017, 7:55 AM

## 2017-01-16 NOTE — Progress Notes (Signed)
Pt. Refused CPAP use this evening, ware to notify if needed.

## 2017-01-16 NOTE — Progress Notes (Signed)
Attempted to call report to [facility]. I left name and number with Network engineer.

## 2017-01-17 ENCOUNTER — Telehealth: Payer: Self-pay | Admitting: *Deleted

## 2017-01-17 NOTE — Telephone Encounter (Signed)
Pt was on TCM list admitted 01/12/17 for osteoarthritis left knee. Pt had LEFT TOTAL KNEE ARTHROPLASTY . She was D/C 01/16/17 to SNF. After being discharge from SNF pt will be following up w/Dr. Tonita Cong in 2 weeks...[name]

## 2017-02-01 ENCOUNTER — Encounter: Payer: Self-pay | Admitting: Internal Medicine

## 2017-02-01 ENCOUNTER — Ambulatory Visit (INDEPENDENT_AMBULATORY_CARE_PROVIDER_SITE_OTHER): Payer: Medicare Other | Admitting: Internal Medicine

## 2017-02-01 DIAGNOSIS — E1142 Type 2 diabetes mellitus with diabetic polyneuropathy: Secondary | ICD-10-CM

## 2017-02-01 DIAGNOSIS — H9190 Unspecified hearing loss, unspecified ear: Secondary | ICD-10-CM

## 2017-02-01 DIAGNOSIS — I10 Essential (primary) hypertension: Secondary | ICD-10-CM

## 2017-02-01 DIAGNOSIS — H9191 Unspecified hearing loss, right ear: Secondary | ICD-10-CM | POA: Insufficient documentation

## 2017-02-01 DIAGNOSIS — L299 Pruritus, unspecified: Secondary | ICD-10-CM | POA: Diagnosis not present

## 2017-02-01 HISTORY — DX: Unspecified hearing loss, unspecified ear: H91.90

## 2017-02-01 MED ORDER — MECLIZINE HCL 12.5 MG PO TABS: 12.5000 mg | ORAL_TABLET | Freq: Three times a day (TID) | ORAL | 0 refills | Status: DC | PRN

## 2017-02-01 MED ORDER — METHYLPREDNISOLONE 4 MG PO TBPK: ORAL_TABLET | ORAL | 0 refills | Status: DC

## 2017-02-01 NOTE — Progress Notes (Signed)
Subjective:    Patient ID: [name], female    DOB: Sep 20, 1940, 76 y.o.   MRN: [id number]  HPI The patient is here for follow up.  She was admitted 01/12/17 - 01/16/17 for left knee arthritis and had a total knee replacement. She was discharged to rehabilitation and did physical therapy.  She left rehab on 01/27/17. She is currently doing physical therapy at home..      Diabetes with neuropathy: She is taking all her medication as prescribed, including gabapentin for her neuropathy. She is doing physical therapy as stated above, but no other exercise.  She is compliant with a diabetic diet.  Her sugar this morning was 130.  Hypertension: She is taking her medication daily. She is compliant with a low sodium diet.  She denies chest pain, palpitations, and regular headaches.    Right ear symptoms: She has heard one popping noise in her ear this morning.   She is not hearing as well.  That started this morning.  She wears a hearing aid, but is not wearing it now.  She can move her jaw in a certain manner and the hearing comes back, but then goes away again.    Itching:  She itchiess all over and will welt up.  She thinks it is something she is taking.   This started just before or when she left rehab.  The meloxicam was new since leaving rehab - she stopping taking it on Monday.  She is still itching. It is not better.  She denies any other new medications.  Medications and allergies reviewed with patient and updated if appropriate.  Patient Active Problem List   Diagnosis Date Noted  . Primary osteoarthritis of left knee 01/12/2017  . Left knee DJD 01/12/2017  . Constipation 01/10/2017  . Chest tightness 11/09/2016  . Hypercalcemia 10/12/2016  . Chest pain 10/12/2016  . DOE (dyspnea on exertion) 10/12/2016  . Preop examination 10/12/2016  . Chronic back pain 08/23/2016  . Hair loss 08/23/2016  . Diabetes ([place]) 07/26/2016  . Anxiety 09/14/2015  . Muscle pain, myofacial 05/19/2015    . Lower extremity edema 12/16/2014  . Pelvic cyst   . Diverticulitis large intestine w/o perforation or abscess w/o bleeding 05/12/2014  . Essential hypertension 05/12/2014  . DM neuropathy, type II diabetes mellitus ([place]) 05/12/2014  . OSA on CPAP 05/12/2014  . Depression 05/12/2014  . Seizure disorder ([place]) 05/12/2014  . Abnormal CT scan, pelvis 05/12/2014  . Hepatic steatosis 05/12/2014  . Obesity (BMI 30-39.9) 05/12/2014  . Cerebrovascular disease, unspecified 03/13/2013  . Allergic rhinitis 03/13/2013  . GOITER, MULTINODULAR 05/18/2009  . VERTIGO 04/13/2009  . Irritable bowel syndrome 03/20/2009  . INSOMNIA 03/20/2009  . Hyperlipidemia 03/18/2009  . SLEEP APNEA, OBSTRUCTIVE 03/18/2009  . GERD 03/18/2009  . Osteoarthritis 03/18/2009  . SEIZURE DISORDER 03/18/2009    Current Outpatient Prescriptions on File Prior to Visit  Medication Sig Dispense Refill  . ACCU-CHEK AVIVA PLUS test strip USE TO CHECK BLOOD SUGARS  TWO TIMES DAILY 200 each 3  . albuterol (PROAIR HFA) 108 (90 BASE) MCG/ACT inhaler Inhale 2 puffs into the lungs every 6 (six) hours as needed for wheezing or shortness of breath.     . Alpha-D-Galactosidase (BEANO PO) Take 1-2 tablets by mouth daily as needed (for gas).    Marland Kitchen aspirin EC 325 MG tablet Take 1 tablet (325 mg total) by mouth 2 (two) times daily. 60 tablet 1  . azelastine (ASTELIN) 0.1 %  nasal spray Place 2 sprays into both nostrils 2 (two) times daily as needed for rhinitis. Use in each nostril as directed    . Biotin 5000 MCG TABS Take 5,000 mcg by mouth every other day.     . Blood Glucose Monitoring Suppl (ACCU-CHEK AVIVA PLUS) W/DEVICE KIT 1 kit by Does not apply route once. 1 kit 0  . Cholecalciferol (EQL VITAMIN D3) 1000 UNITS tablet Take 1,000 Units by mouth every other day.     . clonazePAM (KLONOPIN) 0.5 MG tablet Take 0.5-1 tablets (0.25-0.5 mg total) by mouth 2 (two) times daily as needed for anxiety. 60 tablet 1  . cyclobenzaprine (FLEXERIL)  5 MG tablet TAKE 1 TABLET (5 MG TOTAL) BY MOUTH 3 (THREE) TIMES DAILY AS NEEDED FOR MUSCLE SPASMS. 90 tablet 1  . docusate sodium (COLACE) 100 MG capsule Take 1 capsule (100 mg total) by mouth 2 (two) times daily as needed for mild constipation. 30 capsule 1  . famotidine (PEPCID) 20 MG tablet TAKE 1 TABLET (20 MG TOTAL) BY MOUTH DAILY. (Patient taking differently: TAKE 1 TABLET (20 MG TOTAL) BY MOUTH IN THE EVENING.) 90 tablet 1  . gabapentin (NEURONTIN) 100 MG capsule Take 1 capsule (100 mg total) by mouth 3 (three) times daily. In addition to 800 mg TID for total of 900 mg TID 90 capsule 3  . gabapentin (NEURONTIN) 800 MG tablet TAKE 1 TABLET BY MOUTH 3  TIMES DAILY 270 tablet 1  . glipiZIDE (GLUCOTROL XL) 5 MG 24 hr tablet Take 5 mg by mouth daily with supper.     . hydrochlorothiazide (HYDRODIURIL) 25 MG tablet Take 1 tablet (25 mg total) by mouth daily. 90 tablet 3  . linaclotide (LINZESS) 145 MCG CAPS capsule Take 1 capsule (145 mcg total) by mouth daily before breakfast. 30 capsule 1  . meclizine (ANTIVERT) 12.5 MG tablet Take 1 tablet (12.5 mg total) by mouth 3 (three) times daily as needed for dizziness. 30 tablet 0  . metFORMIN (GLUCOPHAGE-XR) 500 MG 24 hr tablet TAKE 1 TABLET (500 MG TOTAL) BY MOUTH DAILY WITH BREAKFAST. 90 tablet 3  . Multiple Vitamin (MULTIVITAMIN) capsule Take 1 capsule by mouth every other day.     . Naphazoline-Glycerin-Zinc Sulf (CLEAR EYES MAXIMUM ITCHY EYE OP) Place 1 drop into both eyes every 8 (eight) hours as needed (itchy eyes).    . OXcarbazepine (TRILEPTAL) 150 MG tablet TAKE 1.5 TABLETS (225 MG TOTAL) BY MOUTH 2 (TWO) TIMES DAILY. 140 tablet 3  . polyethylene glycol (MIRALAX / GLYCOLAX) packet Take 17 g by mouth daily. 14 each 0  . potassium chloride SA (KLOR-CON M20) 20 MEQ tablet Take 2 tablets (40 mEq total) by mouth daily. (Patient taking differently: Take 20 mEq by mouth 2 (two) times daily. ) 180 tablet 1  . Probiotic Product (PROBIOTIC PO) Take 1  capsule by mouth daily after breakfast.     . ranitidine (ZANTAC) 150 MG tablet Take 1 tablet (150 mg total) by mouth 2 (two) times daily. (Patient taking differently: Take 150 mg by mouth at bedtime as needed for heartburn. ) 60 tablet 5  . traMADol (ULTRAM) 50 MG tablet Take 1 tablet (50 mg total) by mouth every 6 (six) hours as needed. 40 tablet 1   No current facility-administered medications on file prior to visit.     Past Medical History:  Diagnosis Date  . Allergic rhinitis, cause unspecified 03/13/2013  . Anxiety   . Cerebrovascular disease, unspecified 03/13/2013   Atrophy and  small vessel dz noted, MR brain 2009  . Depression   . Diabetes mellitus, type 2 ([place])   . Diverticulosis of colon 03/2010 hosp  . Dyslipidemia   . GERD (gastroesophageal reflux disease)   . Hypertension   . Neuropathy   . OSA on CPAP   . Osteoarthritis of shoulder region    and Knee  . Seizure disorder ([place])    onset 11 years ago; repeated 2013    Past Surgical History:  Procedure Laterality Date  . ABDOMINAL HYSTERECTOMY  1970's   Partial  . APPENDECTOMY    . CHOLECYSTECTOMY    . SHOULDER SURGERY  2008   LT, post fall   . TONSILLECTOMY AND ADENOIDECTOMY    . TOTAL KNEE ARTHROPLASTY Left 01/12/2017   Procedure: LEFT TOTAL KNEE ARTHROPLASTY;  Surgeon: Susa Day, MD;  Location: [facility];  Service: Orthopedics;  Laterality: Left;  120 mins    Social History   Social History  . Marital status: Married    Spouse name: N/A  . Number of children: N/A  . Years of education: N/A   Social History Main Topics  . Smoking status: Former Smoker    Quit date: 10/19/1985  . Smokeless tobacco: Never Used  . Alcohol use 0.0 oz/week     Comment: occasionally   . Drug use: No  . Sexual activity: Not on file   Other Topics Concern  . Not on file   Social History Narrative   Patient lives in a one story home with her husband.  Has 2 children.  Retired from [employer].    Family History  Problem  Relation Age of Onset  . Arthritis Mother   . Heart disease Father   . Arthritis Other        Grandmother  . Diabetes Other        Grandmother    Review of Systems  Constitutional: Negative for chills and fever.  Respiratory: Positive for shortness of breath and wheezing. Negative for cough.   Cardiovascular: Positive for leg swelling. Negative for chest pain and palpitations.  Gastrointestinal: Positive for abdominal pain (llq - chronic).  Skin:       Itching all over  Neurological: Positive for light-headedness. Negative for headaches.       Objective:   Vitals:   02/01/17 1117  BP: 122/64  Pulse: 100  Resp: 16  Temp: 98 F (36.7 C)   Wt Readings from Last 3 Encounters:  02/01/17 171 lb (77.6 kg)  01/12/17 180 lb (81.6 kg)  01/10/17 180 lb (81.6 kg)   Body mass index is 29.35 kg/m.   Physical Exam    Constitutional: Appears well-developed and well-nourished. No distress.  HENT:  Head: Normocephalic and atraumatic.  Neck: Right ear canal with excessive cerumen that is completely blocking the ear canal, tympanic membrane not visualized, no canal redness, left ear canal with moderate cerumen, tympanic membrane that is visualized appears normal, no redness; Neck supple. No tracheal deviation present. No thyromegaly present.  No cervical lymphadenopathy Cardiovascular: Normal rate, regular rhythm and normal heart sounds.   No murmur heard. No carotid bruit .  No edema-wearing compression socks Pulmonary/Chest: Effort normal and breath sounds normal. No respiratory distress. No has no wheezes. No rales.  Skin: Skin is warm and dry. Not diaphoretic.  Psychiatric: Normal mood and affect. Behavior is normal.      Assessment & Plan:    See Problem List for Assessment and Plan of chronic medical problems.

## 2017-02-01 NOTE — Assessment & Plan Note (Signed)
Acute on chronic Typically wears a hearing aid, but has had acute hearing loss Related to excessive cerumen We will try using hydrogen peroxide Call if no improvement

## 2017-02-01 NOTE — Assessment & Plan Note (Signed)
BP well controlled Current regimen effective and well tolerated Continue current medications at current doses  

## 2017-02-01 NOTE — Assessment & Plan Note (Signed)
Diffuse, since leaving rehabilitation Possibly related to a new medication, but unsure The only new medication that we are sure of his meloxicam and she did stop this 2 days ago we'll continue to hold it Start Claritin or Zyrtec We'll do a quick Medrol Dosepak Call if no improvement

## 2017-02-01 NOTE — Patient Instructions (Addendum)
Increase zantac to twice a day for now and slowly decrease when your heartburn is improved.   Start an oral anti-histamine such as claritin, zyrtec or allegra.  Take the oral steroid pack as directed.   Continue to hold the meloxicam.  Take the tramadol as needed.   Use hydrogen peroxide in the right ear to help remove the wax.

## 2017-02-01 NOTE — Assessment & Plan Note (Signed)
Sugars have been well controlled at home are well controlled and we checked her A1c last month Continue current medication She will continue to increase her activity as tolerated

## 2017-02-02 ENCOUNTER — Telehealth: Payer: Self-pay | Admitting: Internal Medicine

## 2017-02-02 MED ORDER — CYCLOBENZAPRINE HCL 5 MG PO TABS: ORAL_TABLET | ORAL | 0 refills | Status: DC

## 2017-02-02 NOTE — Telephone Encounter (Signed)
Pt called and was seen by burns yesterday, she would like a refill of cyclobenzaprine (FLEXERIL) 5 MG tablet  Please advise  [facility] [address]

## 2017-02-02 NOTE — Telephone Encounter (Signed)
Notified [facility].../[facility]

## 2017-02-24 ENCOUNTER — Other Ambulatory Visit: Payer: Self-pay | Admitting: Internal Medicine

## 2017-03-03 ENCOUNTER — Other Ambulatory Visit: Payer: Self-pay | Admitting: Internal Medicine

## 2017-03-03 NOTE — Telephone Encounter (Signed)
RX faxed to [facility] 

## 2017-03-03 NOTE — Telephone Encounter (Signed)
[place] Controlled Substance Database checked. Last filled on 12/16/16. Does not look liked RX given in ED was filled.

## 2017-03-21 ENCOUNTER — Other Ambulatory Visit: Payer: Self-pay | Admitting: Internal Medicine

## 2017-03-21 ENCOUNTER — Other Ambulatory Visit: Payer: Self-pay | Admitting: Neurology

## 2017-03-21 NOTE — Telephone Encounter (Signed)
[place] controlled substance database checked.  Ok to fill medication. rx printed 

## 2017-03-21 NOTE — Telephone Encounter (Signed)
I am unable to get into the controlled database.

## 2017-03-22 NOTE — Telephone Encounter (Signed)
RX faxed to [facility] 

## 2017-03-27 ENCOUNTER — Other Ambulatory Visit: Payer: Self-pay | Admitting: Internal Medicine

## 2017-03-28 ENCOUNTER — Telehealth: Payer: Self-pay | Admitting: Neurology

## 2017-03-28 NOTE — Telephone Encounter (Signed)
Prescribed by neuro

## 2017-03-28 NOTE — Telephone Encounter (Signed)
Patient is needing a refill on medication. Patient lmom. She did not say which medication. Please call. Thanks

## 2017-03-28 NOTE — Telephone Encounter (Signed)
Is this something you will fill?

## 2017-03-29 ENCOUNTER — Other Ambulatory Visit: Payer: Self-pay | Admitting: *Deleted

## 2017-03-29 MED ORDER — OXCARBAZEPINE 150 MG PO TABS: 150.0000 mg | ORAL_TABLET | Freq: Two times a day (BID) | ORAL | 0 refills | Status: DC

## 2017-03-29 NOTE — Telephone Encounter (Signed)
Patient informed that she needs an appointment.  Rx sent in and appointment scheduled for 04-21-17 at 2:45.

## 2017-04-08 NOTE — Progress Notes (Signed)
Subjective:    Patient ID: [name], female    DOB: 17-Mar-1941, 76 y.o.   MRN: [id number]  HPI She is here for an acute visit.   Rash:  She has a rash on both cheeks and the bridge of her nose, under her breasts and in her pelvic regions on both sides.  This rash has come and gone.  It does itch.  She has not seen derm.  She has applied hydrocortisone cream and it helps.    Urinary symptoms:  She has the urge to urinate, but has difficulty urinating. This started one year ago.  She has had dysuria for about one year and has gotten worse.  She has an odor to her urine.  She sometimes feels like there is a blockage on the left lower quadrant.  She has had a partial hysterectomy.  She denies abdominal pain.  She has abdominal bloating after eating.       Medications and allergies reviewed with patient and updated if appropriate.  Patient Active Problem List   Diagnosis Date Noted  . Pruritus 02/01/2017  . Decreased hearing of right ear 02/01/2017  . Primary osteoarthritis of left knee 01/12/2017  . Left knee DJD 01/12/2017  . Constipation 01/10/2017  . Chest tightness 11/09/2016  . Hypercalcemia 10/12/2016  . Chest pain 10/12/2016  . DOE (dyspnea on exertion) 10/12/2016  . Preop examination 10/12/2016  . Chronic back pain 08/23/2016  . Hair loss 08/23/2016  . Diabetes ([place]) 07/26/2016  . Anxiety 09/14/2015  . Muscle pain, myofacial 05/19/2015  . Lower extremity edema 12/16/2014  . Pelvic cyst   . Diverticulitis large intestine w/o perforation or abscess w/o bleeding 05/12/2014  . Essential hypertension 05/12/2014  . DM neuropathy, type II diabetes mellitus ([place]) 05/12/2014  . OSA on CPAP 05/12/2014  . Depression 05/12/2014  . Seizure disorder ([place]) 05/12/2014  . Hepatic steatosis 05/12/2014  . Obesity (BMI 30-39.9) 05/12/2014  . Cerebrovascular disease, unspecified 03/13/2013  . Allergic rhinitis 03/13/2013  . GOITER, MULTINODULAR 05/18/2009  . VERTIGO 04/13/2009  .  Irritable bowel syndrome 03/20/2009  . INSOMNIA 03/20/2009  . Hyperlipidemia 03/18/2009  . SLEEP APNEA, OBSTRUCTIVE 03/18/2009  . GERD 03/18/2009  . Osteoarthritis 03/18/2009  . SEIZURE DISORDER 03/18/2009    Current Outpatient Prescriptions on File Prior to Visit  Medication Sig Dispense Refill  . ACCU-CHEK AVIVA PLUS test strip USE TO CHECK BLOOD SUGARS  TWO TIMES DAILY 200 each 3  . albuterol (PROAIR HFA) 108 (90 BASE) MCG/ACT inhaler Inhale 2 puffs into the lungs every 6 (six) hours as needed for wheezing or shortness of breath.     . Alpha-D-Galactosidase (BEANO PO) Take 1-2 tablets by mouth daily as needed (for gas).    Marland Kitchen aspirin EC 325 MG tablet Take 1 tablet (325 mg total) by mouth 2 (two) times daily. 60 tablet 1  . azelastine (ASTELIN) 0.1 % nasal spray Place 2 sprays into both nostrils 2 (two) times daily as needed for rhinitis. Use in each nostril as directed    . Biotin 5000 MCG TABS Take 5,000 mcg by mouth every other day.     . Blood Glucose Monitoring Suppl (ACCU-CHEK AVIVA PLUS) W/DEVICE KIT 1 kit by Does not apply route once. 1 kit 0  . Cholecalciferol (EQL VITAMIN D3) 1000 UNITS tablet Take 1,000 Units by mouth every other day.     . clonazePAM (KLONOPIN) 0.5 MG tablet Take 0.5-1 tablets (0.25-0.5 mg total) by mouth 2 (two) times  daily as needed for anxiety. 60 tablet 1  . cyclobenzaprine (FLEXERIL) 5 MG tablet TAKE 1 TABLET (5 MG TOTAL) BY MOUTH 3 (THREE) TIMES DAILY AS NEEDED FOR MUSCLE SPASMS. 30 tablet 0  . docusate sodium (COLACE) 100 MG capsule Take 1 capsule (100 mg total) by mouth 2 (two) times daily as needed for mild constipation. 30 capsule 1  . famotidine (PEPCID) 20 MG tablet TAKE 1 TABLET (20 MG TOTAL) BY MOUTH DAILY. (Patient taking differently: TAKE 1 TABLET (20 MG TOTAL) BY MOUTH IN THE EVENING.) 90 tablet 1  . gabapentin (NEURONTIN) 100 MG capsule Take 1 capsule (100 mg total) by mouth 3 (three) times daily. In addition to 800 mg TID for total of 900 mg TID  90 capsule 3  . gabapentin (NEURONTIN) 800 MG tablet TAKE 1 TABLET BY MOUTH 3  TIMES DAILY 270 tablet 1  . glipiZIDE (GLUCOTROL XL) 5 MG 24 hr tablet TAKE 1 TABLET EVERY DAY 90 tablet 1  . hydrochlorothiazide (HYDRODIURIL) 25 MG tablet Take 1 tablet (25 mg total) by mouth daily. 90 tablet 3  . linaclotide (LINZESS) 145 MCG CAPS capsule Take 1 capsule (145 mcg total) by mouth daily before breakfast. 30 capsule 1  . meclizine (ANTIVERT) 12.5 MG tablet Take 1 tablet (12.5 mg total) by mouth 3 (three) times daily as needed for dizziness. 30 tablet 0  . metFORMIN (GLUCOPHAGE-XR) 500 MG 24 hr tablet TAKE 1 TABLET (500 MG TOTAL) BY MOUTH DAILY WITH BREAKFAST. 90 tablet 3  . Multiple Vitamin (MULTIVITAMIN) capsule Take 1 capsule by mouth every other day.     . Naphazoline-Glycerin-Zinc Sulf (CLEAR EYES MAXIMUM ITCHY EYE OP) Place 1 drop into both eyes every 8 (eight) hours as needed (itchy eyes).    . OXcarbazepine (TRILEPTAL) 150 MG tablet Take 1 tablet (150 mg total) by mouth 2 (two) times daily. 90 tablet 0  . polyethylene glycol (MIRALAX / GLYCOLAX) packet Take 17 g by mouth daily. 14 each 0  . potassium chloride SA (KLOR-CON M20) 20 MEQ tablet Take 2 tablets (40 mEq total) by mouth daily. (Patient taking differently: Take 20 mEq by mouth 2 (two) times daily. ) 180 tablet 1  . Probiotic Product (PROBIOTIC PO) Take 1 capsule by mouth daily after breakfast.     . ranitidine (ZANTAC) 150 MG tablet Take 1 tablet (150 mg total) by mouth 2 (two) times daily. (Patient taking differently: Take 150 mg by mouth at bedtime as needed for heartburn. ) 60 tablet 5  . traMADol (ULTRAM) 50 MG tablet TAKE 1 TABLET EVERY 12 HOURS AS NEEDED 60 tablet 1   No current facility-administered medications on file prior to visit.     Past Medical History:  Diagnosis Date  . Allergic rhinitis, cause unspecified 03/13/2013  . Anxiety   . Cerebrovascular disease, unspecified 03/13/2013   Atrophy and small vessel dz noted, MR  brain 2009  . Depression   . Diabetes mellitus, type 2 ([place])   . Diverticulosis of colon 03/2010 hosp  . Dyslipidemia   . GERD (gastroesophageal reflux disease)   . Hypertension   . Neuropathy   . OSA on CPAP   . Osteoarthritis of shoulder region    and Knee  . Seizure disorder ([place])    onset 11 years ago; repeated 2013    Past Surgical History:  Procedure Laterality Date  . ABDOMINAL HYSTERECTOMY  1970's   Partial  . APPENDECTOMY    . CHOLECYSTECTOMY    . SHOULDER SURGERY  2008   LT, post fall   . TONSILLECTOMY AND ADENOIDECTOMY    . TOTAL KNEE ARTHROPLASTY Left 01/12/2017   Procedure: LEFT TOTAL KNEE ARTHROPLASTY;  Surgeon: Susa Day, MD;  Location: [facility];  Service: Orthopedics;  Laterality: Left;  120 mins    Social History   Social History  . Marital status: Married    Spouse name: N/A  . Number of children: N/A  . Years of education: N/A   Social History Main Topics  . Smoking status: Former Smoker    Quit date: 10/19/1985  . Smokeless tobacco: Never Used  . Alcohol use 0.0 oz/week     Comment: occasionally   . Drug use: No  . Sexual activity: Not Asked   Other Topics Concern  . None   Social History Narrative   Patient lives in a one story home with her husband.  Has 2 children.  Retired from [employer].    Family History  Problem Relation Age of Onset  . Arthritis Mother   . Heart disease Father   . Arthritis Other        Grandmother  . Diabetes Other        Grandmother    Review of Systems  Constitutional: Negative for chills and fever.  Gastrointestinal: Positive for abdominal distention and constipation.  Endocrine: Positive for cold intolerance.  Genitourinary: Positive for difficulty urinating, dysuria and urgency. Negative for frequency (decreased frequency) and hematuria.       Urine darker, urine with odor       Objective:   Vitals:   04/10/17 1134  BP: 134/78  Pulse: 68  Resp: 16  Temp: 98.1 F (36.7 C)  SpO2: 97%    Filed Weights   04/10/17 1134  Weight: 167 lb (75.8 kg)   Body mass index is 28.67 kg/m.  Wt Readings from Last 3 Encounters:  04/10/17 167 lb (75.8 kg)  02/01/17 171 lb (77.6 kg)  01/12/17 180 lb (81.6 kg)     Physical Exam  Constitutional: She appears well-developed and well-nourished. No distress.  HENT:  Head: Normocephalic and atraumatic.  Eyes: Conjunctivae are normal.  Abdominal: Soft. She exhibits no distension and no mass. There is tenderness (left lower quadrant ). There is no rebound and no guarding.  Skin: She is not diaphoretic.  Papular rash on cheek - very mild        Assessment & Plan:   See Problem List for Assessment and Plan of chronic medical problems.

## 2017-04-10 ENCOUNTER — Encounter: Payer: Self-pay | Admitting: Internal Medicine

## 2017-04-10 ENCOUNTER — Other Ambulatory Visit: Payer: Medicare Other

## 2017-04-10 ENCOUNTER — Other Ambulatory Visit (INDEPENDENT_AMBULATORY_CARE_PROVIDER_SITE_OTHER): Payer: Medicare Other

## 2017-04-10 ENCOUNTER — Ambulatory Visit (INDEPENDENT_AMBULATORY_CARE_PROVIDER_SITE_OTHER): Payer: Medicare Other | Admitting: Internal Medicine

## 2017-04-10 VITALS — BP 134/78 | HR 68 | Temp 98.1°F | Resp 16 | Wt 167.0 lb

## 2017-04-10 DIAGNOSIS — R300 Dysuria: Secondary | ICD-10-CM | POA: Diagnosis not present

## 2017-04-10 DIAGNOSIS — R10A2 Flank pain, left side: Secondary | ICD-10-CM | POA: Insufficient documentation

## 2017-04-10 DIAGNOSIS — R21 Rash and other nonspecific skin eruption: Secondary | ICD-10-CM | POA: Insufficient documentation

## 2017-04-10 DIAGNOSIS — R140 Abdominal distension (gaseous): Secondary | ICD-10-CM | POA: Diagnosis not present

## 2017-04-10 DIAGNOSIS — R109 Unspecified abdominal pain: Secondary | ICD-10-CM | POA: Diagnosis not present

## 2017-04-10 HISTORY — DX: Abdominal distension (gaseous): R14.0

## 2017-04-10 LAB — COMPREHENSIVE METABOLIC PANEL
ALBUMIN: 4.6 g/dL (ref 3.5–5.2)
ALT: 17 U/L (ref 0–35)
AST: 22 U/L (ref 0–37)
Alkaline Phosphatase: 80 U/L (ref 39–117)
BUN: 10 mg/dL (ref 6–23)
CALCIUM: 10.8 mg/dL — AB (ref 8.4–10.5)
CHLORIDE: 93 meq/L — AB (ref 96–112)
CO2: 31 mEq/L (ref 19–32)
Creatinine, Ser: 0.77 mg/dL (ref 0.40–1.20)
GFR: 94.16 mL/min (ref >60.00)
Glucose, Bld: 142 mg/dL — ABNORMAL HIGH (ref 70–99)
Potassium: 3.6 mEq/L (ref 3.5–5.1)
Sodium: 136 mEq/L (ref 135–145)
TOTAL PROTEIN: 8.2 g/dL (ref 6.0–8.3)
Total Bilirubin: 0.4 mg/dL (ref 0.2–1.2)

## 2017-04-10 LAB — POCT URINALYSIS DIPSTICK
Bilirubin, UA: NEGATIVE
Blood, UA: NEGATIVE
Glucose, UA: NEGATIVE
KETONES UA: NEGATIVE
Nitrite, UA: NEGATIVE
PH UA: 6 (ref 5.0–8.0)
PROTEIN UA: NEGATIVE
SPEC GRAV UA: 1.015 (ref 1.010–1.025)
Urobilinogen, UA: 0.2 E.U./dL

## 2017-04-10 LAB — CBC WITH DIFFERENTIAL/PLATELET
BASOS PCT: 0.4 % (ref 0.0–3.0)
Basophils Absolute: 0 10*3/uL (ref 0.0–0.1)
EOS ABS: 0.1 10*3/uL (ref 0.0–0.7)
EOS PCT: 1 % (ref 0.0–5.0)
HEMATOCRIT: 41 % (ref 36.0–46.0)
HEMOGLOBIN: 13.6 g/dL (ref 12.0–15.0)
LYMPHS PCT: 43.2 % (ref 12.0–46.0)
Lymphs Abs: 2.7 10*3/uL (ref 0.7–4.0)
MCHC: 33.2 g/dL (ref 30.0–36.0)
MCV: 89.8 fl (ref 78.0–100.0)
Monocytes Absolute: 0.5 10*3/uL (ref 0.1–1.0)
Monocytes Relative: 8.8 % (ref 3.0–12.0)
Neutro Abs: 2.9 10*3/uL (ref 1.4–7.7)
Neutrophils Relative %: 46.6 % (ref 43.0–77.0)
Platelets: 336 10*3/uL (ref 150.0–400.0)
RBC: 4.56 Mil/uL (ref 3.87–5.11)
RDW: 14.7 % (ref 11.5–15.5)
WBC: 6.2 10*3/uL (ref 4.0–10.5)

## 2017-04-10 NOTE — Assessment & Plan Note (Signed)
Will check ct abd/pelvis given LLQ pain - rule out ovarian pathology, hydronephrosis, less likely diverticulitis

## 2017-04-10 NOTE — Assessment & Plan Note (Signed)
Worse recently, but not new.  Has had difficulty urinating for a while. Urine dip with some leukocytes - will send for culture and treat if positive If culture negative needs to see urology or urogynecology

## 2017-04-10 NOTE — Patient Instructions (Addendum)
Have blood work done today.   We will call you with the results of your urine culture.   I have ordered a ct scan of your abdomen - someone will call you to schedule this.

## 2017-04-10 NOTE — Assessment & Plan Note (Signed)
On face, under breasts and pelvic region Advised to see derm

## 2017-04-10 NOTE — Assessment & Plan Note (Signed)
Left lower quadrant pain and left flank pain with urinary symptoms ? UTI, kidney stone, with abd bloating - need to r/o ovarian tumor/cancer, given more chronic nature diverticulitis is less likely Ct scan of Ab/pelvis ordered Cbc, cmp

## 2017-04-11 LAB — URINE CULTURE
MICRO NUMBER: 81146376
SPECIMEN QUALITY: ADEQUATE

## 2017-04-12 ENCOUNTER — Ambulatory Visit (INDEPENDENT_AMBULATORY_CARE_PROVIDER_SITE_OTHER)
Admission: RE | Admit: 2017-04-12 | Discharge: 2017-04-12 | Disposition: A | Discharge status: Home / Self Care | Payer: Medicare Other | Source: Ambulatory Visit | Attending: Internal Medicine | Admitting: Internal Medicine

## 2017-04-12 DIAGNOSIS — R140 Abdominal distension (gaseous): Secondary | ICD-10-CM | POA: Diagnosis not present

## 2017-04-12 DIAGNOSIS — R109 Unspecified abdominal pain: Secondary | ICD-10-CM | POA: Diagnosis not present

## 2017-04-12 MED ORDER — IOPAMIDOL (ISOVUE-300) INJECTION 61%
100.0000 mL | Freq: Once | INTRAVENOUS | Status: AC | PRN
  Administered 2017-04-12: 100 mL via INTRAVENOUS

## 2017-04-13 ENCOUNTER — Other Ambulatory Visit: Payer: Self-pay | Admitting: Internal Medicine

## 2017-04-13 DIAGNOSIS — R300 Dysuria: Secondary | ICD-10-CM

## 2017-04-20 ENCOUNTER — Other Ambulatory Visit: Payer: Self-pay | Admitting: Internal Medicine

## 2017-04-21 ENCOUNTER — Ambulatory Visit (INDEPENDENT_AMBULATORY_CARE_PROVIDER_SITE_OTHER): Payer: Medicare Other | Admitting: Neurology

## 2017-04-21 ENCOUNTER — Encounter: Payer: Self-pay | Admitting: Neurology

## 2017-04-21 ENCOUNTER — Other Ambulatory Visit (INDEPENDENT_AMBULATORY_CARE_PROVIDER_SITE_OTHER): Payer: Medicare Other

## 2017-04-21 VITALS — BP 140/70 | HR 77 | Ht 62.0 in | Wt 168.0 lb

## 2017-04-21 DIAGNOSIS — R569 Unspecified convulsions: Secondary | ICD-10-CM

## 2017-04-21 DIAGNOSIS — E1142 Type 2 diabetes mellitus with diabetic polyneuropathy: Secondary | ICD-10-CM

## 2017-04-21 LAB — VITAMIN B12: VITAMIN B 12: 1167 pg/mL — AB (ref 211–911)

## 2017-04-21 MED ORDER — NORTRIPTYLINE HCL 10 MG PO CAPS: ORAL_CAPSULE | ORAL | 3 refills | Status: DC

## 2017-04-21 NOTE — Patient Instructions (Signed)
1.  Check vitamin B12 level  2.  Start nortriptyline 10mg  at bedtime for 2 week, then increase to 2 tablet at bedtime  3.  Reduce your gabapentin to 400mg  three times daily for one week.  If your pain does not get worse, reduce to 400mg  twice daily  Return to clinic in 4 months
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
## 2017-04-24 ENCOUNTER — Telehealth: Payer: Self-pay | Admitting: *Deleted

## 2017-04-24 NOTE — Telephone Encounter (Signed)
Attempted to contact patient.  No answer and no voicemail. 

## 2017-04-24 NOTE — Telephone Encounter (Signed)
-----   Message from Alda Berthold, DO sent at 04/21/2017  5:40 PM EDT ----- Please notify patient lab are within normal limits.  Thank you.

## 2017-04-25 ENCOUNTER — Telehealth: Payer: Self-pay | Admitting: Neurology

## 2017-04-25 NOTE — Telephone Encounter (Signed)
Attempted to contact patient to let her know that I have sent the Rx back through for [facility] but no answer and no voicemail.

## 2017-04-25 NOTE — Telephone Encounter (Signed)
Pt called and said that the medication Dr Posey Pronto put her on the insurance company won't pay for and she wants to know what she can take now

## 2017-04-26 ENCOUNTER — Encounter: Payer: Self-pay | Admitting: *Deleted

## 2017-04-26 ENCOUNTER — Telehealth: Payer: Self-pay | Admitting: *Deleted

## 2017-04-26 NOTE — Telephone Encounter (Signed)
Letter sent.

## 2017-04-26 NOTE — Telephone Encounter (Signed)
-----   Message from Alda Berthold, DO sent at 04/21/2017  5:40 PM EDT ----- Please notify patient lab are within normal limits.  Thank you.

## 2017-04-28 ENCOUNTER — Other Ambulatory Visit: Payer: Self-pay | Admitting: Internal Medicine

## 2017-04-28 ENCOUNTER — Telehealth: Payer: Self-pay | Admitting: Internal Medicine

## 2017-04-28 NOTE — Telephone Encounter (Signed)
error 

## 2017-05-16 ENCOUNTER — Other Ambulatory Visit: Payer: Self-pay | Admitting: Internal Medicine

## 2017-05-23 ENCOUNTER — Other Ambulatory Visit: Payer: Self-pay | Admitting: Neurology

## 2017-05-24 ENCOUNTER — Other Ambulatory Visit: Payer: Self-pay | Admitting: *Deleted

## 2017-05-24 MED ORDER — OXCARBAZEPINE 150 MG PO TABS: 150.0000 mg | ORAL_TABLET | Freq: Two times a day (BID) | ORAL | 5 refills | Status: DC

## 2017-07-14 ENCOUNTER — Ambulatory Visit: Payer: Medicare Other | Admitting: Internal Medicine

## 2017-07-20 NOTE — Progress Notes (Signed)
Subjective:    Patient ID: [name], female    DOB: 1941-02-25, 77 y.o.   MRN: [id number]  HPI The patient is here for follow up.  Diabetes with neuropathy: She is taking her medication daily as prescribed. She is compliant with a diabetic diet. She is exercising some- home exercises. She monitors her sugars and they have been running 82, 62, 90,95, 101; 130 is high for her.    Hypertension: She is taking her medication daily. She is compliant with a low sodium diet.  She denies chest pain, palpitations, edema  and regular headaches. She is exercising  - home exercises.      Anxiety: She is taking her medication daily as prescribed. She denies any side effects from the medication. She feels her anxiety is well controlled and she is happy with her current dose of medication.   GERD:  She is taking her medication as needed.  She denies any GERD symptoms daily, but has occasional GERD.  She feels her GERD is well controlled.  She does not feel that she needs daily medication.  lower back pain:  chronic pain across her lower back.  She takes tylenol as needed and it helps.  She lays down and it helps.   Ear ringing, poor balance:  She takes an otc ear ringing medication.  She has occasional dizziness and has taken meclizine in the past, but it was very expensive through her insurance.  Neck pain: When she it sitting and bends her head down and straightens her head back up she has a pain on the right side of her neck that radiates up the head.  She has pain at the base of her skull.    Medications and allergies reviewed with patient and updated if appropriate.  Patient Active Problem List   Diagnosis Date Noted  . Abdominal distension (gaseous) 04/10/2017  . Left flank pain 04/10/2017  . Rash and nonspecific skin eruption 04/10/2017  . Pruritus 02/01/2017  . Decreased hearing of right ear 02/01/2017  . Primary osteoarthritis of left knee 01/12/2017  . Left knee DJD 01/12/2017  .  Constipation 01/10/2017  . Chest tightness 11/09/2016  . Hypercalcemia 10/12/2016  . Chest pain 10/12/2016  . DOE (dyspnea on exertion) 10/12/2016  . Chronic back pain 08/23/2016  . Hair loss 08/23/2016  . Diabetes ([place]) 07/26/2016  . Anxiety 09/14/2015  . Muscle pain, myofacial 05/19/2015  . Lower extremity edema 12/16/2014  . Diverticulitis large intestine w/o perforation or abscess w/o bleeding 05/12/2014  . Essential hypertension 05/12/2014  . DM neuropathy, type II diabetes mellitus ([place]) 05/12/2014  . OSA on CPAP 05/12/2014  . Seizure disorder ([place]) 05/12/2014  . Hepatic steatosis 05/12/2014  . Obesity (BMI 30-39.9) 05/12/2014  . Cerebrovascular disease, unspecified 03/13/2013  . Allergic rhinitis 03/13/2013  . GOITER, MULTINODULAR 05/18/2009  . VERTIGO 04/13/2009  . Irritable bowel syndrome 03/20/2009  . INSOMNIA 03/20/2009  . Hyperlipidemia 03/18/2009  . GERD 03/18/2009  . Osteoarthritis 03/18/2009    Current Outpatient Medications on File Prior to Visit  Medication Sig Dispense Refill  . ACCU-CHEK AVIVA PLUS test strip USE TO CHECK BLOOD SUGARS  TWO TIMES DAILY 200 each 3  . albuterol (PROAIR HFA) 108 (90 BASE) MCG/ACT inhaler Inhale 2 puffs into the lungs every 6 (six) hours as needed for wheezing or shortness of breath.     . Alpha-D-Galactosidase (BEANO PO) Take 1-2 tablets by mouth daily as needed (for gas).    Marland Kitchen aspirin  EC 325 MG tablet Take 1 tablet (325 mg total) by mouth 2 (two) times daily. 60 tablet 1  . azelastine (ASTELIN) 0.1 % nasal spray Place 2 sprays into both nostrils 2 (two) times daily as needed for rhinitis. Use in each nostril as directed    . Biotin 5000 MCG TABS Take 5,000 mcg by mouth every other day.     . Blood Glucose Monitoring Suppl (ACCU-CHEK AVIVA PLUS) W/DEVICE KIT 1 kit by Does not apply route once. 1 kit 0  . Cholecalciferol (EQL VITAMIN D3) 1000 UNITS tablet Take 1,000 Units by mouth every other day.     . clonazePAM (KLONOPIN) 0.5  MG tablet Take 0.5-1 tablets (0.25-0.5 mg total) by mouth 2 (two) times daily as needed for anxiety. 60 tablet 1  . cyclobenzaprine (FLEXERIL) 5 MG tablet TAKE 1 TABLET (5 MG TOTAL) BY MOUTH 3 (THREE) TIMES DAILY AS NEEDED FOR MUSCLE SPASMS. 30 tablet 0  . docusate sodium (COLACE) 100 MG capsule Take 1 capsule (100 mg total) by mouth 2 (two) times daily as needed for mild constipation. 30 capsule 1  . famotidine (PEPCID) 20 MG tablet TAKE 1 TABLET (20 MG TOTAL) BY MOUTH DAILY. (Patient taking differently: TAKE 1 TABLET (20 MG TOTAL) BY MOUTH IN THE EVENING.) 90 tablet 1  . gabapentin (NEURONTIN) 100 MG capsule TAKE 1 CAPSULE BY MOUTH THREE TIMES A DAY 90 capsule 3  . gabapentin (NEURONTIN) 800 MG tablet TAKE 1 TABLET BY MOUTH 3  TIMES DAILY 270 tablet 1  . glipiZIDE (GLUCOTROL XL) 5 MG 24 hr tablet TAKE 1 TABLET EVERY DAY 90 tablet 1  . hydrochlorothiazide (HYDRODIURIL) 25 MG tablet Take 1 tablet (25 mg total) by mouth daily. 90 tablet 3  . KLOR-CON M20 20 MEQ tablet TAKE 2 TABLETS (40 MEQ TOTAL) BY MOUTH DAILY. 180 tablet 0  . linaclotide (LINZESS) 145 MCG CAPS capsule Take 1 capsule (145 mcg total) by mouth daily before breakfast. 30 capsule 1  . meclizine (ANTIVERT) 12.5 MG tablet Take 1 tablet (12.5 mg total) by mouth 3 (three) times daily as needed for dizziness. 30 tablet 0  . metFORMIN (GLUCOPHAGE-XR) 500 MG 24 hr tablet TAKE 1 TABLET (500 MG TOTAL) BY MOUTH DAILY WITH BREAKFAST. 90 tablet 3  . Multiple Vitamin (MULTIVITAMIN) capsule Take 1 capsule by mouth every other day.     . Naphazoline-Glycerin-Zinc Sulf (CLEAR EYES MAXIMUM ITCHY EYE OP) Place 1 drop into both eyes every 8 (eight) hours as needed (itchy eyes).    . nortriptyline (PAMELOR) 10 MG capsule Start nortriptyline 68m at bedtime for 2 week, then increase to 2 tablet at bedtime 60 capsule 3  . OXcarbazepine (TRILEPTAL) 150 MG tablet Take 1 tablet (150 mg total) by mouth 2 (two) times daily. 60 tablet 5  . polyethylene glycol  (MIRALAX / GLYCOLAX) packet Take 17 g by mouth daily. 14 each 0  . Probiotic Product (PROBIOTIC PO) Take 1 capsule by mouth daily after breakfast.     . ranitidine (ZANTAC) 150 MG tablet Take 1 tablet (150 mg total) by mouth 2 (two) times daily. (Patient taking differently: Take 150 mg by mouth at bedtime as needed for heartburn. ) 60 tablet 5  . traMADol (ULTRAM) 50 MG tablet TAKE 1 TABLET EVERY 12 HOURS AS NEEDED 60 tablet 1   No current facility-administered medications on file prior to visit.     Past Medical History:  Diagnosis Date  . Allergic rhinitis, cause unspecified 03/13/2013  . Anxiety   .  Cerebrovascular disease, unspecified 03/13/2013   Atrophy and small vessel dz noted, MR brain 2009  . Depression   . Diabetes mellitus, type 2 ([place])   . Diverticulosis of colon 03/2010 hosp  . Dyslipidemia   . GERD (gastroesophageal reflux disease)   . Hypertension   . Neuropathy   . OSA on CPAP   . Osteoarthritis of shoulder region    and Knee  . Seizure disorder ([place])    onset 11 years ago; repeated 2013    Past Surgical History:  Procedure Laterality Date  . ABDOMINAL HYSTERECTOMY  1970's   Partial  . APPENDECTOMY    . CHOLECYSTECTOMY    . SHOULDER SURGERY  2008   LT, post fall   . TONSILLECTOMY AND ADENOIDECTOMY    . TOTAL KNEE ARTHROPLASTY Left 01/12/2017   Procedure: LEFT TOTAL KNEE ARTHROPLASTY;  Surgeon: Susa Day, MD;  Location: [facility];  Service: Orthopedics;  Laterality: Left;  120 mins    Social History   Socioeconomic History  . Marital status: Married    Spouse name: Not on file  . Number of children: Not on file  . Years of education: Not on file  . Highest education level: Not on file  Social Needs  . Financial resource strain: Not on file  . Food insecurity - worry: Not on file  . Food insecurity - inability: Not on file  . Transportation needs - medical: Not on file  . Transportation needs - non-medical: Not on file  Occupational History  . Not  on file  Tobacco Use  . Smoking status: Former Smoker    Last attempt to quit: 10/19/1985    Years since quitting: 31.7  . Smokeless tobacco: Never Used  Substance and Sexual Activity  . Alcohol use: Yes    Alcohol/week: 0.0 oz    Comment: occasionally   . Drug use: No  . Sexual activity: Not on file  Other Topics Concern  . Not on file  Social History Narrative   Patient lives in a one story home with her husband.  Has 2 children.  Retired from [employer].    Family History  Problem Relation Age of Onset  . Arthritis Mother   . Heart disease Father   . Arthritis Other        Grandmother  . Diabetes Other        Grandmother    Review of Systems  Constitutional: Negative for chills and fever.  Respiratory: Positive for shortness of breath (with incline). Negative for cough and wheezing.   Cardiovascular: Negative for chest pain and palpitations.  Gastrointestinal: Positive for constipation.  Neurological: Positive for dizziness and headaches (occ).       Objective:   Vitals:   07/21/17 1559  BP: 130/80  Pulse: 99  Resp: 16  Temp: 97.8 F (36.6 C)  SpO2: 98%   Wt Readings from Last 3 Encounters:  07/21/17 171 lb (77.6 kg)  04/21/17 168 lb (76.2 kg)  04/10/17 167 lb (75.8 kg)   Body mass index is 31.28 kg/m.   Physical Exam    Constitutional: Appears well-developed and well-nourished. No distress.  HENT:  Head: Normocephalic and atraumatic.  Neck: Neck supple. No tracheal deviation present. No thyromegaly present.  No cervical lymphadenopathy.  Tenderness with palpation posterior right base of skull Cardiovascular: Normal rate, regular rhythm and normal heart sounds.   No murmur heard. No carotid bruit .  No edema Pulmonary/Chest: Effort normal and breath sounds normal. No respiratory  distress. No has no wheezes. No rales.  Skin: Skin is warm and dry. Not diaphoretic.  Psychiatric: Normal mood and affect. Behavior is normal.      Assessment & Plan:     See Problem List for Assessment and Plan of chronic medical problems.

## 2017-07-20 NOTE — Patient Instructions (Addendum)
  Test(s) ordered today. Your results will be released to [place] (or called to you) after review, usually within 72hours after test completion. If any changes need to be made, you will be notified at that same time.   Medications reviewed and updated.  No changes recommended at this time.   A referral was ordered for orthopedics  Please followup in 6 months

## 2017-07-21 ENCOUNTER — Ambulatory Visit: Payer: Medicare Other | Admitting: Internal Medicine

## 2017-07-21 ENCOUNTER — Other Ambulatory Visit (INDEPENDENT_AMBULATORY_CARE_PROVIDER_SITE_OTHER): Payer: Medicare Other

## 2017-07-21 ENCOUNTER — Encounter: Payer: Self-pay | Admitting: Internal Medicine

## 2017-07-21 VITALS — BP 130/80 | HR 99 | Temp 97.8°F | Resp 16 | Wt 171.0 lb

## 2017-07-21 DIAGNOSIS — E1142 Type 2 diabetes mellitus with diabetic polyneuropathy: Secondary | ICD-10-CM

## 2017-07-21 DIAGNOSIS — E1140 Type 2 diabetes mellitus with diabetic neuropathy, unspecified: Secondary | ICD-10-CM

## 2017-07-21 DIAGNOSIS — K5900 Constipation, unspecified: Secondary | ICD-10-CM

## 2017-07-21 DIAGNOSIS — M542 Cervicalgia: Secondary | ICD-10-CM | POA: Diagnosis not present

## 2017-07-21 DIAGNOSIS — I10 Essential (primary) hypertension: Secondary | ICD-10-CM | POA: Diagnosis not present

## 2017-07-21 DIAGNOSIS — G8929 Other chronic pain: Secondary | ICD-10-CM

## 2017-07-21 DIAGNOSIS — M545 Low back pain: Secondary | ICD-10-CM

## 2017-07-21 DIAGNOSIS — F419 Anxiety disorder, unspecified: Secondary | ICD-10-CM | POA: Diagnosis not present

## 2017-07-21 LAB — COMPREHENSIVE METABOLIC PANEL
ALT: 11 U/L (ref 0–35)
AST: 18 U/L (ref 0–37)
Albumin: 4.5 g/dL (ref 3.5–5.2)
Alkaline Phosphatase: 69 U/L (ref 39–117)
BUN: 11 mg/dL (ref 6–23)
CALCIUM: 10.6 mg/dL — AB (ref 8.4–10.5)
CHLORIDE: 89 meq/L — AB (ref 96–112)
CO2: 34 mEq/L — ABNORMAL HIGH (ref 19–32)
Creatinine, Ser: 0.77 mg/dL (ref 0.40–1.20)
GFR: 94.09 mL/min (ref >60.00)
Glucose, Bld: 69 mg/dL — ABNORMAL LOW (ref 70–99)
POTASSIUM: 3.7 meq/L (ref 3.5–5.1)
Sodium: 132 mEq/L — ABNORMAL LOW (ref 135–145)
Total Bilirubin: 0.3 mg/dL (ref 0.2–1.2)
Total Protein: 8.2 g/dL (ref 6.0–8.3)

## 2017-07-21 LAB — CBC WITH DIFFERENTIAL/PLATELET
BASOS ABS: 0.1 10*3/uL (ref 0.0–0.1)
BASOS PCT: 1.1 % (ref 0.0–3.0)
EOS ABS: 0.1 10*3/uL (ref 0.0–0.7)
Eosinophils Relative: 1.9 % (ref 0.0–5.0)
HCT: 38.4 % (ref 36.0–46.0)
HEMOGLOBIN: 13 g/dL (ref 12.0–15.0)
LYMPHS PCT: 44 % (ref 12.0–46.0)
Lymphs Abs: 3.1 10*3/uL (ref 0.7–4.0)
MCHC: 33.8 g/dL (ref 30.0–36.0)
MCV: 89.5 fl (ref 78.0–100.0)
Monocytes Absolute: 0.6 10*3/uL (ref 0.1–1.0)
Monocytes Relative: 8.4 % (ref 3.0–12.0)
Neutro Abs: 3.1 10*3/uL (ref 1.4–7.7)
Neutrophils Relative %: 44.6 % (ref 43.0–77.0)
Platelets: 333 10*3/uL (ref 150.0–400.0)
RBC: 4.29 Mil/uL (ref 3.87–5.11)
RDW: 14.3 % (ref 11.5–15.5)
WBC: 7 10*3/uL (ref 4.0–10.5)

## 2017-07-21 LAB — LIPID PANEL
CHOL/HDL RATIO: 3
CHOLESTEROL: 213 mg/dL — AB (ref 0–200)
HDL: 61.6 mg/dL (ref >39.00)
NonHDL: 151.16
TRIGLYCERIDES: 241 mg/dL — AB (ref 0.0–149.0)
VLDL: 48.2 mg/dL — AB (ref 0.0–40.0)

## 2017-07-21 LAB — TSH: TSH: 3.53 u[IU]/mL (ref 0.35–4.50)

## 2017-07-21 LAB — HEMOGLOBIN A1C: Hgb A1c MFr Bld: 6.8 % — ABNORMAL HIGH (ref 4.6–6.5)

## 2017-07-21 LAB — LDL CHOLESTEROL, DIRECT: Direct LDL: 113 mg/dL

## 2017-07-21 NOTE — Assessment & Plan Note (Addendum)
Sugars well controlled at home Continue currently medications Check a1c Work on increasing exercise

## 2017-07-21 NOTE — Assessment & Plan Note (Signed)
Intermittent neck pain that seems to be somewhat positional-located at the base of the skull on the right side and radiates up to the head Likely related to arthritis and neuralgia Will refer to orthopedics for further evaluation and treatment Can take Tylenol, muscle relaxer

## 2017-07-21 NOTE — Assessment & Plan Note (Addendum)
Following with neuro - Dr Posey Pronto Taking nortriptyline 20 mg at night Taking gabapentin 500 mg TID She does not want to increase the doses Continue above

## 2017-07-21 NOTE — Assessment & Plan Note (Signed)
BP well controlled Current regimen effective and well tolerated Continue current medications at current doses cmp  

## 2017-07-21 NOTE — Assessment & Plan Note (Addendum)
Linzess works but is too expensive She has tried Office manager and it does not work She has tried several other things and will continue to work on over-the-counter remedies Continue to increase water intake Work on increasing activity Can try magnesium supplementation Linzess samples given

## 2017-07-21 NOTE — Assessment & Plan Note (Signed)
Controlled, stable Continue current dose of medication  

## 2017-07-21 NOTE — Assessment & Plan Note (Signed)
Chronic lower back pain Taking Tylenol She would like to know what is causing the pain and look at different treatment options Will refer to orthopedics

## 2017-07-24 ENCOUNTER — Other Ambulatory Visit: Payer: Self-pay | Admitting: Internal Medicine

## 2017-07-24 MED ORDER — EZETIMIBE 10 MG PO TABS: 10.0000 mg | ORAL_TABLET | Freq: Every day | ORAL | 3 refills | Status: DC

## 2017-08-02 DIAGNOSIS — M51369 Other intervertebral disc degeneration, lumbar region without mention of lumbar back pain or lower extremity pain: Secondary | ICD-10-CM

## 2017-08-02 DIAGNOSIS — M5136 Other intervertebral disc degeneration, lumbar region: Secondary | ICD-10-CM

## 2017-08-02 HISTORY — DX: Other intervertebral disc degeneration, lumbar region: M51.36

## 2017-08-02 HISTORY — DX: Other intervertebral disc degeneration, lumbar region without mention of lumbar back pain or lower extremity pain: M51.369

## 2017-08-25 HISTORY — PX: LUMBAR EPIDURAL INJECTION: SHX1980

## 2017-09-04 ENCOUNTER — Ambulatory Visit: Payer: Medicare Other | Admitting: Neurology

## 2017-09-04 ENCOUNTER — Other Ambulatory Visit: Payer: Self-pay | Admitting: Internal Medicine

## 2017-09-06 ENCOUNTER — Other Ambulatory Visit: Payer: Self-pay | Admitting: Internal Medicine

## 2017-09-12 ENCOUNTER — Other Ambulatory Visit: Payer: Self-pay | Admitting: Emergency Medicine

## 2017-09-12 MED ORDER — POTASSIUM CHLORIDE CRYS ER 20 MEQ PO TBCR: 40.0000 meq | EXTENDED_RELEASE_TABLET | Freq: Every day | ORAL | 1 refills | Status: DC

## 2017-09-20 DIAGNOSIS — M4310 Spondylolisthesis, site unspecified: Secondary | ICD-10-CM

## 2017-09-20 HISTORY — DX: Spondylolisthesis, site unspecified: M43.10

## 2017-09-25 ENCOUNTER — Other Ambulatory Visit: Payer: Self-pay | Admitting: Internal Medicine

## 2017-10-01 ENCOUNTER — Other Ambulatory Visit: Payer: Self-pay | Admitting: Internal Medicine

## 2017-10-11 ENCOUNTER — Ambulatory Visit (INDEPENDENT_AMBULATORY_CARE_PROVIDER_SITE_OTHER): Payer: Medicare Other | Admitting: *Deleted

## 2017-10-11 ENCOUNTER — Ambulatory Visit: Payer: Medicare Other

## 2017-10-11 VITALS — BP 136/72 | HR 76 | Resp 18 | Ht 62.0 in | Wt 177.0 lb

## 2017-10-11 DIAGNOSIS — E1140 Type 2 diabetes mellitus with diabetic neuropathy, unspecified: Secondary | ICD-10-CM

## 2017-10-11 DIAGNOSIS — Z0000 Encounter for general adult medical examination without abnormal findings: Secondary | ICD-10-CM

## 2017-10-11 DIAGNOSIS — Z1231 Encounter for screening mammogram for malignant neoplasm of breast: Secondary | ICD-10-CM | POA: Diagnosis not present

## 2017-10-11 DIAGNOSIS — Z23 Encounter for immunization: Secondary | ICD-10-CM | POA: Diagnosis not present

## 2017-10-11 DIAGNOSIS — E2839 Other primary ovarian failure: Secondary | ICD-10-CM | POA: Diagnosis not present

## 2017-10-11 NOTE — Patient Instructions (Addendum)
Continue doing brain stimulating activities (puzzles, reading, adult coloring books, staying active) to keep memory sharp.   Continue to eat heart healthy diet (full of fruits, vegetables, whole grains, lean protein, water--limit salt, fat, and sugar intake) and increase physical activity as tolerated.   [name] , Thank you for taking time to come for your Medicare Wellness Visit. I appreciate your ongoing commitment to your health goals. Please review the following plan we discussed and let me know if I can assist you in the future.   These are the goals we discussed: Goals    . Patient Stated     Lose weight, [facility] water aerobic, eat healthy and low sugar and carbohydrates. Enjoy life, family, friends and worship God.       This is a list of the screening recommended for you and due dates:  Health Maintenance  Topic Date Due  . Complete foot exam   05/02/2013  . Eye exam for diabetics  07/25/2015  . Pneumonia vaccines (2 of 2 - PPSV23) 09/13/2016  . Urine Protein Check  07/26/2017  . Mammogram  09/09/2017  . Hemoglobin A1C  01/18/2018  . Flu Shot  01/25/2018  . Colon Cancer Screening  05/08/2019  . Tetanus Vaccine  05/02/2022  . DEXA scan (bone density measurement)  Completed   Health Maintenance, Female Adopting a healthy lifestyle and getting preventive care can go a long way to promote health and wellness. Talk with your health care provider about what schedule of regular examinations is right for you. This is a good chance for you to check in with your provider about disease prevention and staying healthy. In between checkups, there are plenty of things you can do on your own. Experts have done a lot of research about which lifestyle changes and preventive measures are most likely to keep you healthy. Ask your health care provider for more information. Weight and diet Eat a healthy diet  Be sure to include plenty of vegetables, fruits, low-fat dairy products, and lean  protein.  Do not eat a lot of foods high in solid fats, added sugars, or salt.  Get regular exercise. This is one of the most important things you can do for your health. ? Most adults should exercise for at least 150 minutes each week. The exercise should increase your heart rate and make you sweat (moderate-intensity exercise). ? Most adults should also do strengthening exercises at least twice a week. This is in addition to the moderate-intensity exercise.  Maintain a healthy weight  Body mass index (BMI) is a measurement that can be used to identify possible weight problems. It estimates body fat based on height and weight. Your health care provider can help determine your BMI and help you achieve or maintain a healthy weight.  For females 82 years of age and older: ? A BMI below 18.5 is considered underweight. ? A BMI of 18.5 to 24.9 is normal. ? A BMI of 25 to 29.9 is considered overweight. ? A BMI of 30 and above is considered obese.  Watch levels of cholesterol and blood lipids  You should start having your blood tested for lipids and cholesterol at 76 years of age, then have this test every 5 years.  You may need to have your cholesterol levels checked more often if: ? Your lipid or cholesterol levels are high. ? You are older than 75 years of age. ? You are at high risk for heart disease.  Cancer screening Lung  Cancer  Lung cancer screening is recommended for adults 49-31 years old who are at high risk for lung cancer because of a history of smoking.  A yearly low-dose CT scan of the lungs is recommended for people who: ? Currently smoke. ? Have quit within the past 15 years. ? Have at least a 30-pack-year history of smoking. A pack year is smoking an average of one pack of cigarettes a day for 1 year.  Yearly screening should continue until it has been 15 years since you quit.  Yearly screening should stop if you develop a health problem that would prevent you from  having lung cancer treatment.  Breast Cancer  Practice breast self-awareness. This means understanding how your breasts normally appear and feel.  It also means doing regular breast self-exams. Let your health care provider know about any changes, no matter how small.  If you are in your 20s or 30s, you should have a clinical breast exam (CBE) by a health care provider every 1-3 years as part of a regular health exam.  If you are 81 or older, have a CBE every year. Also consider having a breast X-ray (mammogram) every year.  If you have a family history of breast cancer, talk to your health care provider about genetic screening.  If you are at high risk for breast cancer, talk to your health care provider about having an MRI and a mammogram every year.  Breast cancer gene (BRCA) assessment is recommended for women who have family members with BRCA-related cancers. BRCA-related cancers include: ? Breast. ? Ovarian. ? Tubal. ? Peritoneal cancers.  Results of the assessment will determine the need for genetic counseling and BRCA1 and BRCA2 testing.  Cervical Cancer Your health care provider may recommend that you be screened regularly for cancer of the pelvic organs (ovaries, uterus, and vagina). This screening involves a pelvic examination, including checking for microscopic changes to the surface of your cervix (Pap test). You may be encouraged to have this screening done every 3 years, beginning at age 72.  For women ages 59-65, health care providers may recommend pelvic exams and Pap testing every 3 years, or they may recommend the Pap and pelvic exam, combined with testing for human papilloma virus (HPV), every 5 years. Some types of HPV increase your risk of cervical cancer. Testing for HPV may also be done on women of any age with unclear Pap test results.  Other health care providers may not recommend any screening for nonpregnant women who are considered low risk for pelvic cancer  and who do not have symptoms. Ask your health care provider if a screening pelvic exam is right for you.  If you have had past treatment for cervical cancer or a condition that could lead to cancer, you need Pap tests and screening for cancer for at least 20 years after your treatment. If Pap tests have been discontinued, your risk factors (such as having a new sexual partner) need to be reassessed to determine if screening should resume. Some women have medical problems that increase the chance of getting cervical cancer. In these cases, your health care provider may recommend more frequent screening and Pap tests.  Colorectal Cancer  This type of cancer can be detected and often prevented.  Routine colorectal cancer screening usually begins at 75 years of age and continues through 75 years of age.  Your health care provider may recommend screening at an earlier age if you have risk factors for colon cancer.  Your health care provider may also recommend using home test kits to check for hidden blood in the stool.  A small camera at the end of a tube can be used to examine your colon directly (sigmoidoscopy or colonoscopy). This is done to check for the earliest forms of colorectal cancer.  Routine screening usually begins at age 80.  Direct examination of the colon should be repeated every 5-10 years through 75 years of age. However, you may need to be screened more often if early forms of precancerous polyps or small growths are found.  Skin Cancer  Check your skin from head to toe regularly.  Tell your health care provider about any new moles or changes in moles, especially if there is a change in a mole's shape or color.  Also tell your health care provider if you have a mole that is larger than the size of a pencil eraser.  Always use sunscreen. Apply sunscreen liberally and repeatedly throughout the day.  Protect yourself by wearing long sleeves, pants, a wide-brimmed hat, and  sunglasses whenever you are outside.  Heart disease, diabetes, and high blood pressure  High blood pressure causes heart disease and increases the risk of stroke. High blood pressure is more likely to develop in: ? People who have blood pressure in the high end of the normal range (130-139/85-89 mm Hg). ? People who are overweight or obese. ? People who are African American.  If you are 13-58 years of age, have your blood pressure checked every 3-5 years. If you are 37 years of age or older, have your blood pressure checked every year. You should have your blood pressure measured twice-once when you are at a hospital or clinic, and once when you are not at a hospital or clinic. Record the average of the two measurements. To check your blood pressure when you are not at a hospital or clinic, you can use: ? An automated blood pressure machine at a pharmacy. ? A home blood pressure monitor.  If you are between 23 years and 37 years old, ask your health care provider if you should take aspirin to prevent strokes.  Have regular diabetes screenings. This involves taking a blood sample to check your fasting blood sugar level. ? If you are at a normal weight and have a low risk for diabetes, have this test once every three years after 75 years of age. ? If you are overweight and have a high risk for diabetes, consider being tested at a younger age or more often. Preventing infection Hepatitis B  If you have a higher risk for hepatitis B, you should be screened for this virus. You are considered at high risk for hepatitis B if: ? You were born in a country where hepatitis B is common. Ask your health care provider which countries are considered high risk. ? Your parents were born in a high-risk country, and you have not been immunized against hepatitis B (hepatitis B vaccine). ? You have HIV or AIDS. ? You use needles to inject street drugs. ? You live with someone who has hepatitis B. ? You have  had sex with someone who has hepatitis B. ? You get hemodialysis treatment. ? You take certain medicines for conditions, including cancer, organ transplantation, and autoimmune conditions.  Hepatitis C  Blood testing is recommended for: ? Everyone born from [birth year] through 1963. ? Anyone with known risk factors for hepatitis C.  Sexually transmitted infections (STIs)  You should be screened  for sexually transmitted infections (STIs) including gonorrhea and chlamydia if: ? You are sexually active and are younger than 75 years of age. ? You are older than 75 years of age and your health care provider tells you that you are at risk for this type of infection. ? Your sexual activity has changed since you were last screened and you are at an increased risk for chlamydia or gonorrhea. Ask your health care provider if you are at risk.  If you do not have HIV, but are at risk, it may be recommended that you take a prescription medicine daily to prevent HIV infection. This is called pre-exposure prophylaxis (PrEP). You are considered at risk if: ? You are sexually active and do not regularly use condoms or know the HIV status of your partner(s). ? You take drugs by injection. ? You are sexually active with a partner who has HIV.  Talk with your health care provider about whether you are at high risk of being infected with HIV. If you choose to begin PrEP, you should first be tested for HIV. You should then be tested every 3 months for as long as you are taking PrEP. Pregnancy  If you are premenopausal and you may become pregnant, ask your health care provider about preconception counseling.  If you may become pregnant, take 400 to 800 micrograms (mcg) of folic acid every day.  If you want to prevent pregnancy, talk to your health care provider about birth control (contraception). Osteoporosis and menopause  Osteoporosis is a disease in which the bones lose minerals and strength with aging. This  can result in serious bone fractures. Your risk for osteoporosis can be identified using a bone density scan.  If you are 38 years of age or older, or if you are at risk for osteoporosis and fractures, ask your health care provider if you should be screened.  Ask your health care provider whether you should take a calcium or vitamin D supplement to lower your risk for osteoporosis.  Menopause may have certain physical symptoms and risks.  Hormone replacement therapy may reduce some of these symptoms and risks. Talk to your health care provider about whether hormone replacement therapy is right for you. Follow these instructions at home:  Schedule regular health, dental, and eye exams.  Stay current with your immunizations.  Do not use any tobacco products including cigarettes, chewing tobacco, or electronic cigarettes.  If you are pregnant, do not drink alcohol.  If you are breastfeeding, limit how much and how often you drink alcohol.  Limit alcohol intake to no more than 1 drink per day for nonpregnant women. One drink equals 12 ounces of beer, 5 ounces of wine, or 1 ounces of hard liquor.  Do not use street drugs.  Do not share needles.  Ask your health care provider for help if you need support or information about quitting drugs.  Tell your health care provider if you often feel depressed.  Tell your health care provider if you have ever been abused or do not feel safe at home. This information is not intended to replace advice given to you by your health care provider. Make sure you discuss any questions you have with your health care provider. Document Released: 12/27/2010 Document Revised: 11/19/2015 Document Reviewed: 03/17/2015 [employer]vier Interactive Patient Education  [employer].

## 2017-10-11 NOTE — Progress Notes (Addendum)
Subjective:   [name] is a 77 y.o. female who presents for an Initial Medicare Annual Wellness Visit.  Review of Systems    No ROS.  Medicare Wellness Visit. Additional risk factors are reflected in the social history.   Cardiac Risk Factors include: advanced age (>48mn, >>58women);diabetes mellitus;dyslipidemia;hypertension Sleep patterns: gets up 1-2 times nightly to void and sleeps 7 hours nightly.    Home Safety/Smoke Alarms: Feels safe in home. Smoke alarms in place.  Living environment; residence and Firearm Safety: 1-story house/ trailer, no firearms. , Lives alone, no needs for DME, good support system Seat Belt Safety/Bike Helmet: Wears seat belt.     Objective:    Today's Vitals   10/11/17 1639 10/11/17 1730  BP: 136/72   Pulse: 76   Resp: 18   SpO2: 100%   Weight: 177 lb (80.3 kg)   Height: 5' 2" (1.575 m)   PainSc:  3    Body mass index is 32.37 kg/m.  Advanced Directives 10/11/2017 01/12/2017 01/03/2017 08/31/2016 12/30/2015 09/26/2014 05/14/2014  Does Patient Have a Medical Advance Directive? Yes _0  No  Type of [place]amed[place]of [place] will - - - - - -  Copy of [place] Chart? No - copy requested - - - - - -  Would patient like information on creating a medical advance directive? - No - Patient declined No - Patient declined - - No - patient declined information -    Current Medications (verified) Outpatient Encounter Medications as of 10/11/2017  Medication Sig  . ACCU-CHEK AVIVA PLUS test strip USE TO CHECK BLOOD SUGARS  TWO TIMES DAILY  . albuterol (PROAIR HFA) 108 (90 BASE) MCG/ACT inhaler Inhale 2 puffs into the lungs every 6 (six) hours as needed for wheezing or shortness of breath.   . Alpha-D-Galactosidase (BEANO PO) Take 1-2 tablets by mouth daily as needed (for gas).  .Marland Kitchenaspirin EC 325 MG tablet Take 1 tablet (325 mg total) by mouth 2 (two) times daily.  .Marland Kitchenazelastine (ASTELIN) 0.1 %  nasal spray Place 2 sprays into both nostrils 2 (two) times daily as needed for rhinitis. Use in each nostril as directed  . Biotin 5000 MCG TABS Take 5,000 mcg by mouth every other day.   . Blood Glucose Monitoring Suppl (ACCU-CHEK AVIVA PLUS) W/DEVICE KIT 1 kit by Does not apply route once.  . Cholecalciferol (EQL VITAMIN D3) 1000 UNITS tablet Take 1,000 Units by mouth every other day.   . clonazePAM (KLONOPIN) 0.5 MG tablet Take 0.5-1 tablets (0.25-0.5 mg total) by mouth 2 (two) times daily as needed for anxiety.  . cyclobenzaprine (FLEXERIL) 5 MG tablet TAKE 1 TABLET (5 MG TOTAL) BY MOUTH 3 (THREE) TIMES DAILY AS NEEDED FOR MUSCLE SPASMS.  .Marland Kitchendocusate sodium (COLACE) 100 MG capsule Take 1 capsule (100 mg total) by mouth 2 (two) times daily as needed for mild constipation.  .Marland Kitchenezetimibe (ZETIA) 10 MG tablet Take 1 tablet (10 mg total) by mouth daily.  . famotidine (PEPCID) 20 MG tablet TAKE 1 TABLET (20 MG TOTAL) BY MOUTH DAILY. (Patient taking differently: TAKE 1 TABLET (20 MG TOTAL) BY MOUTH IN THE EVENING.)  . gabapentin (NEURONTIN) 100 MG capsule TAKE 1 CAPSULE BY MOUTH THREE TIMES A DAY  . gabapentin (NEURONTIN) 800 MG tablet TAKE 1 TABLET BY MOUTH 3  TIMES DAILY  . glipiZIDE (GLUCOTROL XL) 5 MG 24 hr tablet TAKE 1 TABLET EVERY DAY  .  hydrochlorothiazide (HYDRODIURIL) 25 MG tablet Take 1 tablet (25 mg total) by mouth daily. -- Office visit needed for further refills  . linaclotide (LINZESS) 145 MCG CAPS capsule Take 1 capsule (145 mcg total) by mouth daily before breakfast.  . metFORMIN (GLUCOPHAGE-XR) 500 MG 24 hr tablet TAKE 1 TABLET (500 MG TOTAL) BY MOUTH DAILY WITH BREAKFAST.  . Multiple Vitamin (MULTIVITAMIN) capsule Take 1 capsule by mouth every other day.   . Naphazoline-Glycerin-Zinc Sulf (CLEAR EYES MAXIMUM ITCHY EYE OP) Place 1 drop into both eyes every 8 (eight) hours as needed (itchy eyes).  . nortriptyline (PAMELOR) 10 MG capsule Start nortriptyline 44m at bedtime for 2 week,  then increase to 2 tablet at bedtime  . OXcarbazepine (TRILEPTAL) 150 MG tablet Take 1 tablet (150 mg total) by mouth 2 (two) times daily.  . polyethylene glycol (MIRALAX / GLYCOLAX) packet Take 17 g by mouth daily.  . potassium chloride SA (KLOR-CON M20) 20 MEQ tablet Take 2 tablets (40 mEq total) by mouth daily.  . Probiotic Product (PROBIOTIC PO) Take 1 capsule by mouth daily after breakfast.   . ranitidine (ZANTAC) 150 MG tablet Take 1 tablet (150 mg total) by mouth 2 (two) times daily. (Patient taking differently: Take 150 mg by mouth at bedtime as needed for heartburn. )  . traMADol (ULTRAM) 50 MG tablet TAKE 1 TABLET EVERY 12 HOURS AS NEEDED   No facility-administered encounter medications on file as of 10/11/2017.     Allergies (verified) Cymbalta [duloxetine hcl]; Effexor [venlafaxine]; Other; Statins; and Penicillins   History: Past Medical History:  Diagnosis Date  . Allergic rhinitis, cause unspecified 03/13/2013  . Anxiety   . Cerebrovascular disease, unspecified 03/13/2013   Atrophy and small vessel dz noted, MR brain 2009  . Depression   . Diabetes mellitus, type 2 ([place]   . Diverticulosis of colon 03/2010 hosp  . Dyslipidemia   . GERD (gastroesophageal reflux disease)   . Hypertension   . Neuropathy   . OSA on CPAP   . Osteoarthritis of shoulder region    and Knee  . [place] ([place]    onset 11 years ago; repeated 2013   Past Surgical History:  Procedure Laterality Date  . ABDOMINAL HYSTERECTOMY  1970's   Partial  . APPENDECTOMY    . CHOLECYSTECTOMY    . LUMBAR EPIDURAL INJECTION Left 08/25/2017  . SHOULDER SURGERY  2008   LT, post fall   . TONSILLECTOMY AND ADENOIDECTOMY    . TOTAL KNEE ARTHROPLASTY Left 01/12/2017   Procedure: LEFT TOTAL KNEE ARTHROPLASTY;  Surgeon: BSusa Day MD;  Location: [facility];  Service: Orthopedics;  Laterality: Left;  120 mins   Family History  Problem Relation Age of Onset  . Arthritis Mother   . Heart disease Father    . Arthritis Other        Grandmother  . Diabetes Other        Grandmother   Social History   Socioeconomic History  . Marital status: Widowed    Spouse name: Not on file  . Number of children: 2  . Years of education: Not on file  . Highest education level: Not on file  Occupational History  . Not on file  Social Needs  . Financial resource strain: Not hard at all  . Food insecurity:    Worry: Never true    Inability: Never true  . Transportation needs:    Medical: No    Non-medical: No  Tobacco Use  .  Smoking status: Former Smoker    Last attempt to quit: 10/19/1985    Years since quitting: 32.0  . Smokeless tobacco: Never Used  Substance and Sexual Activity  . Alcohol use: Yes    Alcohol/week: 0.0 oz    Comment: occasionally   . Drug use: No  . Sexual activity: Never  Lifestyle  . Physical activity:    Days per week: 0 days    Minutes per session: 0 min  . Stress: Only a little  Relationships  . Social connections:    Talks on phone: More than three times a week    Gets together: More than three times a week    Attends religious service: More than 4 times per year    Active member of club or organization: Yes    Attends meetings of clubs or organizations: More than 4 times per year    Relationship status: Widowed  Other Topics Concern  . Not on file  Social History Narrative   Patient lives in a one story home.  Has 2 children.  Retired from [employer].    Tobacco Counseling Counseling given: Not Answered  Activities of Daily Living In your present state of health, do you have any difficulty performing the following activities: 10/11/2017 01/12/2017  Hearing? Tempie Donning  Vision? N N  Difficulty concentrating or making decisions? N N  Walking or climbing stairs? N Y  Dressing or bathing? N N  Doing errands, shopping? N N  Preparing Food and eating ? N -  Using the Toilet? N -  In the past six months, have you accidently leaked urine? N -  Do you have problems  with loss of bowel control? N -  Managing your Medications? N -  Managing your Finances? N -  Housekeeping or managing your Housekeeping? N -  Some recent data might be hidden     Immunizations and Health Maintenance Immunization History  Administered Date(s) Administered  . Influenza Split 03/28/2011, 02/26/2012, 03/20/2013  . Influenza Whole 02/25/2009, 04/01/2010  . Influenza-Unspecified 09/05/2014, 03/27/2015, 03/27/2016, 02/25/2017  . Pneumococcal Conjugate-13 09/14/2015  . Pneumococcal Polysaccharide-23 06/28/2007  . Tetanus 05/02/2012   Health Maintenance Due  Topic Date Due  . FOOT EXAM  05/02/2013  . OPHTHALMOLOGY EXAM  07/25/2015  . PNA vac Low Risk Adult (2 of 2 - PPSV23) 09/13/2016  . URINE MICROALBUMIN  07/26/2017  . MAMMOGRAM  09/09/2017    Patient Care Team: Binnie Rail, MD as PCP - General (Internal Medicine) Susa Day, MD (Orthopedic Surgery) Izora Gala, MD Blanch Media, MD (Neurology) Satira Sark, MD (Cardiology) Chesley Mires, MD (Pulmonary Disease) Erline Levine, MD (Neurosurgery) Druscilla Brownie, MD (Dermatology)  Indicate any recent Medical Services you may have received from other than [facility] providers in the past year (date may be approximate).     Assessment:   This is a routine wellness examination for [name]. Physical assessment deferred to PCP.   Hearing/Vision screen  Visual Acuity Screening   Right eye Left eye Both eyes  Without correction:   20/40  With correction:     Comments: Referral placed to check retinopathy  Hearing Screening Comments: HOH, has hearing aides  Dietary issues and exercise activities discussed: Current Exercise Habits: The patient does not participate in regular exercise at present(reports she is going to start water aerobics at [facility]), Exercise limited by: orthopedic condition(s) Diet (meal preparation, eat out, water intake, caffeinated beverages, dairy products, fruits and  vegetables): in general, a "healthy"  diet  , well balanced   Reviewed heart healthy and diabetic diet, encouraged patient to increase daily water intake. Diet education was provided via handout.  Goals    . Patient Stated     Lose weight, [facility] water aerobic, eat healthy and low sugar and carbohydrates. Enjoy life, family, friends and worship God.      Depression Screen PHQ 2/9 Scores 10/11/2017 04/10/2017 02/03/2016 05/02/2012  PHQ - 2 Score 1 0 0 2  PHQ- 9 Score 4 - - -    Fall Risk Fall Risk  10/11/2017 04/21/2017 04/10/2017 02/03/2016 02/03/2016  Falls in the past year? No No No - Yes  Injury with Fall? - - - Yes No  Risk for fall due to : Impaired mobility - - - -  Cognitive Function: MMSE - Mini Mental State Exam 10/11/2017  Orientation to time 5  Orientation to Place 5  Registration 3  Attention/ Calculation 5  Recall 2  Language- name 2 objects 2  Language- repeat 1  Language- follow 3 step command 3  Language- read & follow direction 1  Write a sentence 1  Copy design 1  Total score 29        Screening Tests Health Maintenance  Topic Date Due  . FOOT EXAM  05/02/2013  . OPHTHALMOLOGY EXAM  07/25/2015  . PNA vac Low Risk Adult (2 of 2 - PPSV23) 09/13/2016  . URINE MICROALBUMIN  07/26/2017  . MAMMOGRAM  09/09/2017  . HEMOGLOBIN A1C  01/18/2018  . INFLUENZA VACCINE  01/25/2018  . COLONOSCOPY  05/08/2019  . TETANUS/TDAP  05/02/2022  . DEXA SCAN  Completed     Plan:     Referrals placed for mammogram, bone density, and ophthalmology for health screening.  Continue doing brain stimulating activities (puzzles, reading, adult coloring books, staying active) to keep memory sharp.   Continue to eat heart healthy diet (full of fruits, vegetables, whole grains, lean protein, water--limit salt, fat, and sugar intake) and increase physical activity as tolerated.   I have personally reviewed and noted the following in the patient's chart:   . Medical and social  history . Use of alcohol, tobacco or illicit drugs  . Current medications and supplements . Functional ability and status . Nutritional status . Physical activity . Advanced directives . List of other physicians . Vitals . Screenings to include cognitive, depression, and falls . Referrals and appointments  In addition, I have reviewed and discussed with patient certain preventive protocols, quality metrics, and best practice recommendations. A written personalized care plan for preventive services as well as general preventive health recommendations were provided to patient.     Michiel Cowboy, RN   10/11/2017    Medical screening examination/treatment/procedure(s) were performed by non-physician practitioner and as supervising physician I was immediately available for consultation/collaboration. I agree with above. Scarlette Calico, MD

## 2017-10-18 ENCOUNTER — Other Ambulatory Visit: Payer: Self-pay | Admitting: Internal Medicine

## 2017-10-19 ENCOUNTER — Other Ambulatory Visit: Payer: Self-pay | Admitting: Internal Medicine

## 2017-10-20 ENCOUNTER — Other Ambulatory Visit: Payer: Self-pay | Admitting: Internal Medicine

## 2017-10-20 MED ORDER — GABAPENTIN 100 MG PO CAPS: ORAL_CAPSULE | ORAL | 0 refills | Status: DC

## 2017-10-20 NOTE — Addendum Note (Signed)
Addended by: Earnstine Regal on: 10/20/2017 04:15 PM   Modules accepted: Orders

## 2017-11-14 NOTE — Progress Notes (Signed)
Subjective:    Patient ID: [name], female    DOB: [birth year], 77 y.o.   MRN: [id number]  HPI The patient is here for an acute visit.  Rash on face:  Small dots on bilateral sides of nose- wonders if it is related to the gabapentin.  She thinks it is getting better.  Does not itch.  DM neuropathy:  We increased the gabapentin to 900 mg but she can not take it daily.  The medication makes her too drowsy.  She is also noticed that it may be causing side effects-she has noticed that she has difficulty speaking at times-if she stops and then starts again she is able to speak without difficulty.  She has the rash on her face, swelling of her legs and feet and sometimes lack of coordination.  The symptoms she has noticed since increasing the dose of the gabapentin.  She has not noticed much change in her neuropathy symptoms.  She states her balance may also be off due to vertigo as well.    Left ankle swelling, swelling in hands:  This started 5/12. The left lateral ankle became swollen and propping it up helped.  The right nakle swelling transiently.  The swelling is now better, but she was concerned about what the cause was.  Coughing: She has been coughing a lot.  She has noticed that the right side of her neck was swollen a little and sore to touch.  The past couple of days this has improved, but there is still some swelling and slight tenderness.  She states nasal congestion, drainage and a runny nose.  Her mucus is thick.  She has had some sinus pressure and headaches.  She did try the Astelin nasal spray, but this did not seem to help.  She states mild shortness of breath and wheezing.  Sometimes she does cough up some phlegm, but it is clear.     Hypertension: She is taking her medication daily. She is compliant with a low sodium diet.  She denies palpitations, and regular headaches. She is exercising regularly.  She is working on weight loss    Medications and allergies reviewed  with patient and updated if appropriate.  Patient Active Problem List   Diagnosis Date Noted  . Neck pain 07/21/2017  . Abdominal distension (gaseous) 04/10/2017  . Rash and nonspecific skin eruption 04/10/2017  . Pruritus 02/01/2017  . Decreased hearing of right ear 02/01/2017  . Primary osteoarthritis of left knee 01/12/2017  . Left knee DJD 01/12/2017  . Constipation 01/10/2017  . Hypercalcemia 10/12/2016  . DOE (dyspnea on exertion) 10/12/2016  . Chronic back pain 08/23/2016  . Hair loss 08/23/2016  . Diabetes ([place]) 07/26/2016  . Anxiety 09/14/2015  . Muscle pain, myofacial 05/19/2015  . Diverticulitis large intestine w/o perforation or abscess w/o bleeding 05/12/2014  . Essential hypertension 05/12/2014  . DM neuropathy, type II diabetes mellitus ([place]) 05/12/2014  . OSA on CPAP 05/12/2014  . Seizure disorder ([place]) 05/12/2014  . Hepatic steatosis 05/12/2014  . Obesity (BMI 30-39.9) 05/12/2014  . Cerebrovascular disease, unspecified 03/13/2013  . Allergic rhinitis 03/13/2013  . GOITER, MULTINODULAR 05/18/2009  . VERTIGO 04/13/2009  . Irritable bowel syndrome 03/20/2009  . INSOMNIA 03/20/2009  . Hyperlipidemia 03/18/2009  . GERD 03/18/2009  . Osteoarthritis 03/18/2009    Current Outpatient Medications on File Prior to Visit  Medication Sig Dispense Refill  . ACCU-CHEK AVIVA PLUS test strip USE TO CHECK BLOOD SUGARS  TWO TIMES  DAILY 200 each 3  . albuterol (PROAIR HFA) 108 (90 BASE) MCG/ACT inhaler Inhale 2 puffs into the lungs every 6 (six) hours as needed for wheezing or shortness of breath.     . Alpha-D-Galactosidase (BEANO PO) Take 1-2 tablets by mouth daily as needed (for gas).    Marland Kitchen aspirin EC 325 MG tablet Take 1 tablet (325 mg total) by mouth 2 (two) times daily. 60 tablet 1  . azelastine (ASTELIN) 0.1 % nasal spray Place 2 sprays into both nostrils 2 (two) times daily as needed for rhinitis. Use in each nostril as directed    . Biotin 5000 MCG TABS Take 5,000 mcg  by mouth every other day.     . Blood Glucose Monitoring Suppl (ACCU-CHEK AVIVA PLUS) W/DEVICE KIT 1 kit by Does not apply route once. 1 kit 0  . Cholecalciferol (EQL VITAMIN D3) 1000 UNITS tablet Take 1,000 Units by mouth every other day.     . clonazePAM (KLONOPIN) 0.5 MG tablet Take 0.5-1 tablets (0.25-0.5 mg total) by mouth 2 (two) times daily as needed for anxiety. 60 tablet 1  . cyclobenzaprine (FLEXERIL) 5 MG tablet TAKE 1 TABLET (5 MG TOTAL) BY MOUTH 3 (THREE) TIMES DAILY AS NEEDED FOR MUSCLE SPASMS. 30 tablet 0  . docusate sodium (COLACE) 100 MG capsule Take 1 capsule (100 mg total) by mouth 2 (two) times daily as needed for mild constipation. 30 capsule 1  . ezetimibe (ZETIA) 10 MG tablet Take 1 tablet (10 mg total) by mouth daily. 90 tablet 3  . famotidine (PEPCID) 20 MG tablet TAKE 1 TABLET (20 MG TOTAL) BY MOUTH DAILY. (Patient taking differently: TAKE 1 TABLET (20 MG TOTAL) BY MOUTH IN THE EVENING.) 90 tablet 1  . gabapentin (NEURONTIN) 100 MG capsule TAKE 1 CAPSULE BY MOUTH THREE TIMES A DAY 270 capsule 0  . gabapentin (NEURONTIN) 800 MG tablet TAKE 1 TABLET BY MOUTH 3  TIMES DAILY 270 tablet 1  . glipiZIDE (GLUCOTROL XL) 5 MG 24 hr tablet TAKE 1 TABLET EVERY DAY 90 tablet 1  . hydrochlorothiazide (HYDRODIURIL) 25 MG tablet Take 1 tablet (25 mg total) by mouth daily. -- Office visit needed for further refills 90 tablet 0  . hydrochlorothiazide (HYDRODIURIL) 25 MG tablet Take 1 tablet (25 mg total) by mouth daily. Follow-up appt due in July must see Md for future refills 90 tablet 0  . linaclotide (LINZESS) 145 MCG CAPS capsule Take 1 capsule (145 mcg total) by mouth daily before breakfast. 30 capsule 1  . metFORMIN (GLUCOPHAGE-XR) 500 MG 24 hr tablet TAKE 1 TABLET (500 MG TOTAL) BY MOUTH DAILY WITH BREAKFAST. 90 tablet 3  . Multiple Vitamin (MULTIVITAMIN) capsule Take 1 capsule by mouth every other day.     . Naphazoline-Glycerin-Zinc Sulf (CLEAR EYES MAXIMUM ITCHY EYE OP) Place 1 drop  into both eyes every 8 (eight) hours as needed (itchy eyes).    . nortriptyline (PAMELOR) 10 MG capsule Start nortriptyline 30m at bedtime for 2 week, then increase to 2 tablet at bedtime 60 capsule 3  . OXcarbazepine (TRILEPTAL) 150 MG tablet Take 1 tablet (150 mg total) by mouth 2 (two) times daily. 60 tablet 5  . polyethylene glycol (MIRALAX / GLYCOLAX) packet Take 17 g by mouth daily. 14 each 0  . potassium chloride SA (KLOR-CON M20) 20 MEQ tablet Take 2 tablets (40 mEq total) by mouth daily. 180 tablet 1  . Probiotic Product (PROBIOTIC PO) Take 1 capsule by mouth daily after breakfast.     .  ranitidine (ZANTAC) 150 MG tablet Take 1 tablet (150 mg total) by mouth 2 (two) times daily. (Patient taking differently: Take 150 mg by mouth at bedtime as needed for heartburn. ) 60 tablet 5  . traMADol (ULTRAM) 50 MG tablet TAKE 1 TABLET EVERY 12 HOURS AS NEEDED 60 tablet 1   No current facility-administered medications on file prior to visit.     Past Medical History:  Diagnosis Date  . Allergic rhinitis, cause unspecified 03/13/2013  . Anxiety   . Cerebrovascular disease, unspecified 03/13/2013   Atrophy and small vessel dz noted, MR brain 2009  . Depression   . Diabetes mellitus, type 2 ([place])   . Diverticulosis of colon 03/2010 hosp  . Dyslipidemia   . GERD (gastroesophageal reflux disease)   . Hypertension   . Neuropathy   . OSA on CPAP   . Osteoarthritis of shoulder region    and Knee  . Seizure disorder ([place])    onset 11 years ago; repeated 2013    Past Surgical History:  Procedure Laterality Date  . ABDOMINAL HYSTERECTOMY  1970's   Partial  . APPENDECTOMY    . CHOLECYSTECTOMY    . LUMBAR EPIDURAL INJECTION Left 08/25/2017  . SHOULDER SURGERY  2008   LT, post fall   . TONSILLECTOMY AND ADENOIDECTOMY    . TOTAL KNEE ARTHROPLASTY Left 01/12/2017   Procedure: LEFT TOTAL KNEE ARTHROPLASTY;  Surgeon: Susa Day, MD;  Location: [facility];  Service: Orthopedics;  Laterality: Left;   120 mins    Social History   Socioeconomic History  . Marital status: Widowed    Spouse name: Not on file  . Number of children: 2  . Years of education: Not on file  . Highest education level: Not on file  Occupational History  . Not on file  Social Needs  . Financial resource strain: Not hard at all  . Food insecurity:    Worry: Never true    Inability: Never true  . Transportation needs:    Medical: No    Non-medical: No  Tobacco Use  . Smoking status: Former Smoker    Last attempt to quit: 10/19/1985    Years since quitting: 32.0  . Smokeless tobacco: Never Used  Substance and Sexual Activity  . Alcohol use: Yes    Alcohol/week: 0.0 oz    Comment: occasionally   . Drug use: No  . Sexual activity: Never  Lifestyle  . Physical activity:    Days per week: 0 days    Minutes per session: 0 min  . Stress: Only a little  Relationships  . Social connections:    Talks on phone: More than three times a week    Gets together: More than three times a week    Attends religious service: More than 4 times per year    Active member of club or organization: Yes    Attends meetings of clubs or organizations: More than 4 times per year    Relationship status: Widowed  Other Topics Concern  . Not on file  Social History Narrative   Patient lives in a one story home.  Has 2 children.  Retired from [employer].    Family History  Problem Relation Age of Onset  . Arthritis Mother   . Heart disease Father   . Arthritis Other        Grandmother  . Diabetes Other        Grandmother    Review of Systems  Constitutional:  Negative for chills and fever.  HENT: Positive for congestion, ear pain (intermittent rigth ear pain related to right neck pain), rhinorrhea and sinus pressure. Negative for sinus pain and sore throat.   Respiratory: Positive for cough (clear mucus), shortness of breath (a little) and wheezing (occ).   Cardiovascular: Positive for chest pain (intermittent  heaviness) and leg swelling. Negative for palpitations.  Neurological: Positive for headaches. Negative for light-headedness.       Objective:   Vitals:   11/15/17 1604  BP: 128/80  Pulse: 80  Resp: 16  Temp: 98.1 F (36.7 C)  SpO2: 95%   BP Readings from Last 3 Encounters:  11/15/17 128/80  10/11/17 136/72  07/21/17 130/80   Wt Readings from Last 3 Encounters:  11/15/17 178 lb (80.7 kg)  10/11/17 177 lb (80.3 kg)  07/21/17 171 lb (77.6 kg)   Body mass index is 32.56 kg/m.   Physical Exam    GENERAL APPEARANCE: Appears stated age, well appearing, NAD EYES: conjunctiva clear, no icterus HEENT: bilateral tympanic membranes and ear canals normal, oropharynx with mild erythema, no thyromegaly, trachea midline, no cervical or supraclavicular lymphadenopathy LUNGS: Clear to auscultation without wheeze or crackles, unlabored breathing, good air entry bilaterally CARDIOVASCULAR: Normal S1,S2 without murmurs, trace bilateral lower extremity edema SKIN: Warm, dry      Assessment & Plan:    See Problem List for Assessment and Plan of chronic medical problems.

## 2017-11-15 ENCOUNTER — Encounter: Payer: Self-pay | Admitting: Internal Medicine

## 2017-11-15 ENCOUNTER — Ambulatory Visit (INDEPENDENT_AMBULATORY_CARE_PROVIDER_SITE_OTHER): Payer: Medicare Other | Admitting: Internal Medicine

## 2017-11-15 VITALS — BP 128/80 | HR 80 | Temp 98.1°F | Resp 16 | Wt 178.0 lb

## 2017-11-15 DIAGNOSIS — E1140 Type 2 diabetes mellitus with diabetic neuropathy, unspecified: Secondary | ICD-10-CM

## 2017-11-15 DIAGNOSIS — I10 Essential (primary) hypertension: Secondary | ICD-10-CM

## 2017-11-15 DIAGNOSIS — J0110 Acute frontal sinusitis, unspecified: Secondary | ICD-10-CM | POA: Diagnosis not present

## 2017-11-15 MED ORDER — AZITHROMYCIN 250 MG PO TABS: ORAL_TABLET | ORAL | 0 refills | Status: DC

## 2017-11-15 NOTE — Patient Instructions (Addendum)
  Test(s) ordered today. Your results will be released to [place] (or called to you) after review, usually within 72hours after test completion. If any changes need to be made, you will be notified at that same time.   Medications reviewed and updated.  Changes include decreasing gabapentin to 800 mg.  We will start an antibiotic, zpak,  for your sinus infection.   Your prescription(s) have been submitted to your pharmacy. Please take as directed and contact our office if you believe you are having problem(s) with the medication(s).   Please followup in 2-3 months

## 2017-11-16 DIAGNOSIS — J0110 Acute frontal sinusitis, unspecified: Secondary | ICD-10-CM | POA: Insufficient documentation

## 2017-11-16 NOTE — Assessment & Plan Note (Signed)
BP well controlled Current regimen effective and well tolerated Continue current medications at current doses  

## 2017-11-16 NOTE — Assessment & Plan Note (Signed)
Possible sinus infection Will prescribe Z-Pak Discussed symptomatic treatment Call if no improvement

## 2017-11-16 NOTE — Assessment & Plan Note (Signed)
No improvement with increasing gabapentin to 900 mg and she may be having side effects-decrease back down to 800 mg Started on nortriptyline by Dr. Shelah Lewandowsky did have side effects and discontinued this Has not tolerated Cymbalta in the past or Effexor

## 2017-11-27 ENCOUNTER — Other Ambulatory Visit: Payer: Self-pay | Admitting: Neurology

## 2017-12-07 ENCOUNTER — Ambulatory Visit: Payer: Medicare Other

## 2017-12-07 ENCOUNTER — Other Ambulatory Visit: Payer: Medicare Other

## 2017-12-13 ENCOUNTER — Other Ambulatory Visit: Payer: Self-pay | Admitting: Neurology

## 2017-12-19 ENCOUNTER — Encounter: Payer: Self-pay | Admitting: Internal Medicine

## 2017-12-26 ENCOUNTER — Other Ambulatory Visit: Payer: Self-pay | Admitting: Internal Medicine

## 2018-01-12 ENCOUNTER — Other Ambulatory Visit: Payer: Self-pay | Admitting: Internal Medicine

## 2018-01-17 ENCOUNTER — Ambulatory Visit
Admission: RE | Admit: 2018-01-17 | Discharge: 2018-01-17 | Disposition: A | Discharge status: Home / Self Care | Payer: Medicare Other | Source: Ambulatory Visit | Attending: Internal Medicine | Admitting: Internal Medicine

## 2018-01-17 DIAGNOSIS — E2839 Other primary ovarian failure: Secondary | ICD-10-CM

## 2018-01-17 DIAGNOSIS — Z1231 Encounter for screening mammogram for malignant neoplasm of breast: Secondary | ICD-10-CM

## 2018-02-06 NOTE — Progress Notes (Signed)
Subjective:    Patient ID: [name], female    DOB: [birth year], 77 y.o.   MRN: [id number]  HPI The patient is here for follow up.  Headaches:  She has frequent headaches and is not sure why.  She usually does not have this frequent of headaches.  Allergies:  Her allergies have been bad this year.  She is sneezing and coughing.  She uses the nasal spray.  She also takes an over-the-counter allergy medication, but it does not seem to last long.  Cataracts:  She will be having her cataract surgery done this year.  She needs both eyes done.  Chest pain, heaviness: She has occasional chest pain and heaviness in her chest.  She does see cardiology, but has not discussed this with him.  She has intermittent chest heaviness.  She did have a normal stress test May 2018.  Diabetes: She is taking her medication daily as prescribed. She is compliant with a diabetic diet. She is not exercising regularly. She monitors her sugars and it was  137 this morning.  Her sugars have typically been well controlled.  DM neuropathy:  Her feet burn and her neuropathy is bad.  She takes gabapentin 800 mg daily and occasionally if her symptoms are bad she takes an additional 100 mg.  The gabapentin helps, but she still has her symptoms.  She has aches and pains.  Her back hurts, her hips hurt.  She is tired of having pain all over.  She knows she should be exercising regularly.  Hypertension: She is taking her medication daily. She is compliant with a low sodium diet.  She is not exercising regularly.  She does not monitor her blood pressure at home.    Cough, chronic:  She has a dry cough.  When she pushes a lot on her right anterior neck it hurts.  She thinks she feels her lymph nodes go up and down.  Anxiety: She is taking her medication daily as prescribed. She denies any side effects from the medication. She feels her anxiety is fairly well controlled and she is happy with her current dose of medication.     Hyperlipidemia: She is taking her zetia daily. She is compliant with a low fat/cholesterol diet. She is not exercising regularly. She denies myalgias.    Constipation: she takes linzess daily.  This does help, but she still struggles a little.  Medications and allergies reviewed with patient and updated if appropriate.  Patient Active Problem List   Diagnosis Date Noted  . Subacute frontal sinusitis 11/16/2017  . Neck pain 07/21/2017  . Abdominal distension (gaseous) 04/10/2017  . Rash and nonspecific skin eruption 04/10/2017  . Pruritus 02/01/2017  . Decreased hearing of right ear 02/01/2017  . Primary osteoarthritis of left knee 01/12/2017  . Left knee DJD 01/12/2017  . Constipation 01/10/2017  . Hypercalcemia 10/12/2016  . DOE (dyspnea on exertion) 10/12/2016  . Chronic back pain 08/23/2016  . Hair loss 08/23/2016  . Diabetes ([place]) 07/26/2016  . Anxiety 09/14/2015  . Muscle pain, myofacial 05/19/2015  . Diverticulitis large intestine w/o perforation or abscess w/o bleeding 05/12/2014  . Essential hypertension 05/12/2014  . DM neuropathy, type II diabetes mellitus ([place]) 05/12/2014  . Seizure disorder ([place]) 05/12/2014  . Hepatic steatosis 05/12/2014  . Obesity (BMI 30-39.9) 05/12/2014  . Cerebrovascular disease, unspecified 03/13/2013  . Allergic rhinitis 03/13/2013  . GOITER, MULTINODULAR 05/18/2009  . VERTIGO 04/13/2009  . Irritable bowel syndrome 03/20/2009  .  INSOMNIA 03/20/2009  . Hyperlipidemia 03/18/2009  . GERD 03/18/2009  . Osteoarthritis 03/18/2009    Current Outpatient Medications on File Prior to Visit  Medication Sig Dispense Refill  . ACCU-CHEK AVIVA PLUS test strip USE TO CHECK BLOOD SUGARS  TWO TIMES DAILY 200 each 2  . albuterol (PROAIR HFA) 108 (90 BASE) MCG/ACT inhaler Inhale 2 puffs into the lungs every 6 (six) hours as needed for wheezing or shortness of breath.     . Alpha-D-Galactosidase (BEANO PO) Take 1-2 tablets by mouth daily as needed  (for gas).    Marland Kitchen aspirin EC 325 MG tablet Take 1 tablet (325 mg total) by mouth 2 (two) times daily. 60 tablet 1  . azelastine (ASTELIN) 0.1 % nasal spray Place 2 sprays into both nostrils 2 (two) times daily as needed for rhinitis. Use in each nostril as directed    . Biotin 5000 MCG TABS Take 5,000 mcg by mouth every other day.     . Blood Glucose Monitoring Suppl (ACCU-CHEK AVIVA PLUS) W/DEVICE KIT 1 kit by Does not apply route once. 1 kit 0  . Cholecalciferol (EQL VITAMIN D3) 1000 UNITS tablet Take 1,000 Units by mouth every other day.     . clonazePAM (KLONOPIN) 0.5 MG tablet Take 0.5-1 tablets (0.25-0.5 mg total) by mouth 2 (two) times daily as needed for anxiety. 60 tablet 1  . cyclobenzaprine (FLEXERIL) 5 MG tablet TAKE 1 TABLET (5 MG TOTAL) BY MOUTH 3 (THREE) TIMES DAILY AS NEEDED FOR MUSCLE SPASMS. 30 tablet 0  . docusate sodium (COLACE) 100 MG capsule Take 1 capsule (100 mg total) by mouth 2 (two) times daily as needed for mild constipation. 30 capsule 1  . ezetimibe (ZETIA) 10 MG tablet Take 1 tablet (10 mg total) by mouth daily. 90 tablet 3  . famotidine (PEPCID) 20 MG tablet TAKE 1 TABLET (20 MG TOTAL) BY MOUTH DAILY. (Patient taking differently: TAKE 1 TABLET (20 MG TOTAL) BY MOUTH IN THE EVENING.) 90 tablet 1  . gabapentin (NEURONTIN) 100 MG capsule TAKE 1 CAPSULE BY MOUTH THREE TIMES A DAY 270 capsule 0  . gabapentin (NEURONTIN) 800 MG tablet TAKE 1 TABLET BY MOUTH 3  TIMES DAILY 270 tablet 1  . glipiZIDE (GLUCOTROL XL) 5 MG 24 hr tablet TAKE 1 TABLET EVERY DAY 90 tablet 1  . hydrochlorothiazide (HYDRODIURIL) 25 MG tablet Take 1 tablet (25 mg total) by mouth daily. Follow-up appt due in July must see Md for future refills 90 tablet 0  . linaclotide (LINZESS) 145 MCG CAPS capsule Take 1 capsule (145 mcg total) by mouth daily before breakfast. 30 capsule 1  . metFORMIN (GLUCOPHAGE-XR) 500 MG 24 hr tablet TAKE 1 TABLET (500 MG TOTAL) BY MOUTH DAILY WITH BREAKFAST. 90 tablet 3  .  Multiple Vitamin (MULTIVITAMIN) capsule Take 1 capsule by mouth every other day.     . Naphazoline-Glycerin-Zinc Sulf (CLEAR EYES MAXIMUM ITCHY EYE OP) Place 1 drop into both eyes every 8 (eight) hours as needed (itchy eyes).    . OXcarbazepine (TRILEPTAL) 150 MG tablet TAKE 1 TABLET BY MOUTH TWICE A DAY 180 tablet 0  . polyethylene glycol (MIRALAX / GLYCOLAX) packet Take 17 g by mouth daily. 14 each 0  . potassium chloride SA (KLOR-CON M20) 20 MEQ tablet Take 2 tablets (40 mEq total) by mouth daily. 180 tablet 1  . Probiotic Product (PROBIOTIC PO) Take 1 capsule by mouth daily after breakfast.     . ranitidine (ZANTAC) 150 MG tablet Take 1  tablet (150 mg total) by mouth 2 (two) times daily. (Patient taking differently: Take 150 mg by mouth at bedtime as needed for heartburn. ) 60 tablet 5  . traMADol (ULTRAM) 50 MG tablet TAKE 1 TABLET EVERY 12 HOURS AS NEEDED 60 tablet 1   No current facility-administered medications on file prior to visit.     Past Medical History:  Diagnosis Date  . Allergic rhinitis, cause unspecified 03/13/2013  . Anxiety   . Cerebrovascular disease, unspecified 03/13/2013   Atrophy and small vessel dz noted, MR brain 2009  . Depression   . Diabetes mellitus, type 2 ([place])   . Diverticulosis of colon 03/2010 hosp  . Dyslipidemia   . GERD (gastroesophageal reflux disease)   . Hypertension   . Neuropathy   . OSA on CPAP   . Osteoarthritis of shoulder region    and Knee  . Seizure disorder ([place])    onset 11 years ago; repeated 2013    Past Surgical History:  Procedure Laterality Date  . ABDOMINAL HYSTERECTOMY  1970's   Partial  . APPENDECTOMY    . CHOLECYSTECTOMY    . LUMBAR EPIDURAL INJECTION Left 08/25/2017  . SHOULDER SURGERY  2008   LT, post fall   . TONSILLECTOMY AND ADENOIDECTOMY    . TOTAL KNEE ARTHROPLASTY Left 01/12/2017   Procedure: LEFT TOTAL KNEE ARTHROPLASTY;  Surgeon: Susa Day, MD;  Location: [facility];  Service: Orthopedics;  Laterality:  Left;  120 mins    Social History   Socioeconomic History  . Marital status: Widowed    Spouse name: Not on file  . Number of children: 2  . Years of education: Not on file  . Highest education level: Not on file  Occupational History  . Not on file  Social Needs  . Financial resource strain: Not hard at all  . Food insecurity:    Worry: Never true    Inability: Never true  . Transportation needs:    Medical: No    Non-medical: No  Tobacco Use  . Smoking status: Former Smoker    Last attempt to quit: 10/19/1985    Years since quitting: 32.3  . Smokeless tobacco: Never Used  Substance and Sexual Activity  . Alcohol use: Yes    Alcohol/week: 0.0 standard drinks    Comment: occasionally   . Drug use: No  . Sexual activity: Never  Lifestyle  . Physical activity:    Days per week: 0 days    Minutes per session: 0 min  . Stress: Only a little  Relationships  . Social connections:    Talks on phone: More than three times a week    Gets together: More than three times a week    Attends religious service: More than 4 times per year    Active member of club or organization: Yes    Attends meetings of clubs or organizations: More than 4 times per year    Relationship status: Widowed  Other Topics Concern  . Not on file  Social History Narrative   Patient lives in a one story home.  Has 2 children.  Retired from [employer].    Family History  Problem Relation Age of Onset  . Arthritis Mother   . Heart disease Father   . Arthritis Other        Grandmother  . Diabetes Other        Grandmother    Review of Systems  Constitutional: Negative for chills and fever.  HENT: Positive for sneezing.   Respiratory: Positive for cough (chronic), shortness of breath (with exertion) and wheezing (a little).   Cardiovascular: Positive for chest pain and leg swelling (mild at times). Negative for palpitations.  Musculoskeletal: Positive for arthralgias and back pain.  Neurological:  Positive for headaches.       Objective:   Vitals:   02/07/18 1539  BP: 130/80  Pulse: 89  Resp: 16  Temp: 97.8 F (36.6 C)  SpO2: 98%   BP Readings from Last 3 Encounters:  02/07/18 130/80  11/15/17 128/80  10/11/17 136/72   Wt Readings from Last 3 Encounters:  02/07/18 182 lb (82.6 kg)  11/15/17 178 lb (80.7 kg)  10/11/17 177 lb (80.3 kg)   Body mass index is 33.29 kg/m.   Physical Exam    Constitutional: Appears well-developed and well-nourished. No distress.  HENT:  Head: Normocephalic and atraumatic.  Neck: Neck supple. No tracheal deviation present. No thyromegaly present.  No cervical lymphadenopathy Cardiovascular: Normal rate, regular rhythm and normal heart sounds.   No murmur heard. No carotid bruit .  Trace bilateral ankle edema Pulmonary/Chest: Effort normal and breath sounds normal. No respiratory distress. No has no wheezes. No rales.  Skin: Skin is warm and dry. Not diaphoretic.  Psychiatric: Normal mood and affect. Behavior is normal.      Assessment & Plan:    See Problem List for Assessment and Plan of chronic medical problems.

## 2018-02-07 ENCOUNTER — Other Ambulatory Visit (INDEPENDENT_AMBULATORY_CARE_PROVIDER_SITE_OTHER): Payer: Medicare Other

## 2018-02-07 ENCOUNTER — Encounter: Payer: Self-pay | Admitting: Internal Medicine

## 2018-02-07 ENCOUNTER — Ambulatory Visit (INDEPENDENT_AMBULATORY_CARE_PROVIDER_SITE_OTHER): Payer: Medicare Other | Admitting: Internal Medicine

## 2018-02-07 VITALS — BP 130/80 | HR 89 | Temp 97.8°F | Resp 16 | Wt 182.0 lb

## 2018-02-07 DIAGNOSIS — I10 Essential (primary) hypertension: Secondary | ICD-10-CM | POA: Diagnosis not present

## 2018-02-07 DIAGNOSIS — R079 Chest pain, unspecified: Secondary | ICD-10-CM

## 2018-02-07 DIAGNOSIS — E1140 Type 2 diabetes mellitus with diabetic neuropathy, unspecified: Secondary | ICD-10-CM | POA: Diagnosis not present

## 2018-02-07 DIAGNOSIS — F419 Anxiety disorder, unspecified: Secondary | ICD-10-CM

## 2018-02-07 DIAGNOSIS — E1142 Type 2 diabetes mellitus with diabetic polyneuropathy: Secondary | ICD-10-CM | POA: Diagnosis not present

## 2018-02-07 DIAGNOSIS — G8929 Other chronic pain: Secondary | ICD-10-CM

## 2018-02-07 DIAGNOSIS — E782 Mixed hyperlipidemia: Secondary | ICD-10-CM

## 2018-02-07 DIAGNOSIS — M549 Dorsalgia, unspecified: Secondary | ICD-10-CM

## 2018-02-07 LAB — COMPREHENSIVE METABOLIC PANEL
ALBUMIN: 4.7 g/dL (ref 3.5–5.2)
ALK PHOS: 68 U/L (ref 39–117)
ALT: 20 U/L (ref 0–35)
AST: 23 U/L (ref 0–37)
BILIRUBIN TOTAL: 0.3 mg/dL (ref 0.2–1.2)
BUN: 10 mg/dL (ref 6–23)
CO2: 35 mEq/L — ABNORMAL HIGH (ref 19–32)
Calcium: 11.2 mg/dL — ABNORMAL HIGH (ref 8.4–10.5)
Chloride: 91 mEq/L — ABNORMAL LOW (ref 96–112)
Creatinine, Ser: 0.75 mg/dL (ref 0.40–1.20)
GFR: 96.85 mL/min (ref >60.00)
GLUCOSE: 96 mg/dL (ref 70–99)
Potassium: 3.4 mEq/L — ABNORMAL LOW (ref 3.5–5.1)
Sodium: 133 mEq/L — ABNORMAL LOW (ref 135–145)
TOTAL PROTEIN: 8.4 g/dL — AB (ref 6.0–8.3)

## 2018-02-07 LAB — HEMOGLOBIN A1C: HEMOGLOBIN A1C: 8 % — AB (ref 4.6–6.5)

## 2018-02-07 LAB — MICROALBUMIN / CREATININE URINE RATIO
Creatinine,U: 32.2 mg/dL
MICROALB UR: 2.6 mg/dL — AB (ref 0.0–1.9)
MICROALB/CREAT RATIO: 8.2 mg/g (ref 0.0–30.0)

## 2018-02-07 NOTE — Assessment & Plan Note (Signed)
Following with ortho Had an injection a few months ago (08/31/17)- it helped but did not last long Sees Dr Tonita Cong and has seen Dr Nelva Bush for the injection

## 2018-02-07 NOTE — Assessment & Plan Note (Signed)
Sugars fairly well controlled at home Encourage more regular exercise Continue diabetic diet Check A1c, urine microalbumin Continue current medication

## 2018-02-07 NOTE — Assessment & Plan Note (Signed)
Having intermittent chest pain that sounds somewhat atypical She does follow with cardiology and and is due for an appointment so she will schedule one She did have a normal stress test May 2018 so we will hold off on further testing until she sees cardiology If symptoms worsen prior to seeing cardiology she will let me know

## 2018-02-07 NOTE — Assessment & Plan Note (Signed)
BP well controlled Current regimen effective and well tolerated Continue current medications at current doses cmp  

## 2018-02-07 NOTE — Patient Instructions (Addendum)

## 2018-02-07 NOTE — Assessment & Plan Note (Addendum)
Taking gabapentin daily-takes 800 mg every day and when her symptoms are worse she takes 900

## 2018-02-07 NOTE — Assessment & Plan Note (Signed)
Cmp. pth

## 2018-02-07 NOTE — Assessment & Plan Note (Signed)
Lipids have been controlled Continue Zetia Statin intolerant

## 2018-02-07 NOTE — Assessment & Plan Note (Signed)
Controlled, stable Continue current dose of medication  

## 2018-02-08 LAB — PTH, INTACT AND CALCIUM
Calcium: 10.9 mg/dL — ABNORMAL HIGH (ref 8.6–10.4)
PTH: 36 pg/mL (ref 14–64)

## 2018-02-13 ENCOUNTER — Other Ambulatory Visit: Payer: Self-pay | Admitting: Internal Medicine

## 2018-02-14 NOTE — Telephone Encounter (Signed)
Last refill was 03/21/17.  Last OV 02/07/18 Next OV Not listed

## 2018-03-06 ENCOUNTER — Other Ambulatory Visit: Payer: Self-pay | Admitting: Neurology

## 2018-03-15 ENCOUNTER — Other Ambulatory Visit: Payer: Self-pay | Admitting: Internal Medicine

## 2018-03-17 ENCOUNTER — Other Ambulatory Visit: Payer: Self-pay | Admitting: Neurology

## 2018-03-22 ENCOUNTER — Other Ambulatory Visit: Payer: Self-pay | Admitting: Internal Medicine

## 2018-03-26 ENCOUNTER — Telehealth: Payer: Self-pay | Admitting: Neurology

## 2018-03-26 NOTE — Telephone Encounter (Signed)
Patient called regarding her oxycarbazepine Rx # [id number]. She is needing it refilled. She had to cancel her last appointment and now has rescheduled for January. She would like to know can she have it refilled until then. Please Call. Thanks

## 2018-03-27 ENCOUNTER — Other Ambulatory Visit: Payer: Self-pay | Admitting: *Deleted

## 2018-03-27 MED ORDER — OXCARBAZEPINE 150 MG PO TABS: 150.0000 mg | ORAL_TABLET | Freq: Two times a day (BID) | ORAL | 0 refills | Status: DC

## 2018-03-27 NOTE — Telephone Encounter (Signed)
3 month supply sent in

## 2018-04-14 ENCOUNTER — Emergency Department (HOSPITAL_COMMUNITY)
Admission: EM | Admit: 2018-04-14 | Discharge: 2018-04-14 | Disposition: A | Discharge status: Home / Self Care | Payer: Medicare Other | Attending: Emergency Medicine | Admitting: Emergency Medicine

## 2018-04-14 ENCOUNTER — Emergency Department (HOSPITAL_COMMUNITY): Payer: Medicare Other

## 2018-04-14 ENCOUNTER — Encounter (HOSPITAL_COMMUNITY): Payer: Self-pay | Admitting: Emergency Medicine

## 2018-04-14 DIAGNOSIS — Z7984 Long term (current) use of oral hypoglycemic drugs: Secondary | ICD-10-CM | POA: Insufficient documentation

## 2018-04-14 DIAGNOSIS — E119 Type 2 diabetes mellitus without complications: Secondary | ICD-10-CM | POA: Diagnosis not present

## 2018-04-14 DIAGNOSIS — R0789 Other chest pain: Secondary | ICD-10-CM

## 2018-04-14 DIAGNOSIS — Z87891 Personal history of nicotine dependence: Secondary | ICD-10-CM | POA: Insufficient documentation

## 2018-04-14 DIAGNOSIS — I10 Essential (primary) hypertension: Secondary | ICD-10-CM | POA: Diagnosis not present

## 2018-04-14 DIAGNOSIS — J00 Acute nasopharyngitis [common cold]: Secondary | ICD-10-CM | POA: Insufficient documentation

## 2018-04-14 DIAGNOSIS — Z7982 Long term (current) use of aspirin: Secondary | ICD-10-CM | POA: Diagnosis not present

## 2018-04-14 DIAGNOSIS — Z79899 Other long term (current) drug therapy: Secondary | ICD-10-CM | POA: Insufficient documentation

## 2018-04-14 DIAGNOSIS — K219 Gastro-esophageal reflux disease without esophagitis: Secondary | ICD-10-CM | POA: Insufficient documentation

## 2018-04-14 LAB — CBC
HCT: 41.2 % (ref 36.0–46.0)
Hemoglobin: 13.4 g/dL (ref 12.0–15.0)
MCH: 29.7 pg (ref 26.0–34.0)
MCHC: 32.5 g/dL (ref 30.0–36.0)
MCV: 91.4 fL (ref 80.0–100.0)
NRBC: 0 % (ref 0.0–0.2)
PLATELETS: 310 10*3/uL (ref 150–400)
RBC: 4.51 MIL/uL (ref 3.87–5.11)
RDW: 12.7 % (ref 11.5–15.5)
WBC: 6.5 10*3/uL (ref 4.0–10.5)

## 2018-04-14 LAB — BASIC METABOLIC PANEL
Anion gap: 12 (ref 5–15)
BUN: 8 mg/dL (ref 8–23)
CALCIUM: 10 mg/dL (ref 8.9–10.3)
CO2: 27 mmol/L (ref 22–32)
Chloride: 96 mmol/L — ABNORMAL LOW (ref 98–111)
Creatinine, Ser: 0.8 mg/dL (ref 0.44–1.00)
Glucose, Bld: 189 mg/dL — ABNORMAL HIGH (ref 70–99)
Potassium: 3.4 mmol/L — ABNORMAL LOW (ref 3.5–5.1)
SODIUM: 135 mmol/L (ref 135–145)

## 2018-04-14 LAB — I-STAT TROPONIN, ED
TROPONIN I, POC: 0 ng/mL (ref 0.00–0.08)
Troponin i, poc: 0 ng/mL (ref 0.00–0.08)

## 2018-04-14 MED ORDER — IBUPROFEN 400 MG PO TABS
400.0000 mg | ORAL_TABLET | Freq: Once | ORAL | Status: AC
  Administered 2018-04-14: 400 mg via ORAL
  Filled 2018-04-14: qty 1

## 2018-04-14 MED ORDER — POTASSIUM CHLORIDE CRYS ER 10 MEQ PO TBCR
30.0000 meq | EXTENDED_RELEASE_TABLET | Freq: Once | ORAL | Status: AC
  Administered 2018-04-14: 30 meq via ORAL
  Filled 2018-04-14: qty 1

## 2018-04-14 MED ORDER — SUCRALFATE 1 G PO TABS: 1.0000 g | ORAL_TABLET | Freq: Three times a day (TID) | ORAL | 0 refills | Status: DC

## 2018-04-14 MED ORDER — SUCRALFATE 1 G PO TABS
1.0000 g | ORAL_TABLET | Freq: Once | ORAL | Status: AC
  Administered 2018-04-14: 1 g via ORAL
  Filled 2018-04-14: qty 1

## 2018-04-14 MED ORDER — GI COCKTAIL ~~LOC~~
30.0000 mL | Freq: Once | ORAL | Status: AC
  Administered 2018-04-14: 30 mL via ORAL
  Filled 2018-04-14: qty 30

## 2018-04-14 NOTE — ED Notes (Signed)
Report given to Maryfrances Bunnell RN

## 2018-04-14 NOTE — ED Notes (Signed)
Family at bedside. 

## 2018-04-14 NOTE — ED Provider Notes (Signed)
[facility] EMERGENCY DEPARTMENT Provider Note   CSN: [id number] Arrival date & time: 04/14/18  1105     History   Chief Complaint Chief Complaint  Patient presents with  . Chest Pain    HPI [name] is a 77 y.o. female with medical history significant for type 2 diabetes mellitus, hypertension, GERD, hyperlipidemia for evaluation of chest pressure.  Reports that 2 days ago she began experiencing upper respiratory infection symptoms such as nonproductive cough, rhinorrhea, lacrimation, right-sided headache and following that she developed chest pressure and left-sided chest pain that was typically worse when she palpated.  She reported of nausea and shortness of breath but denies diaphoresis, palpitation, vomiting.  She also states that prior to Thursday she has been exercising at home.  Regards to the chest pressure was specifically worse this morning after her daughter bought her biscuits and greasy food.  Currently she states that her chest pressure in the left-sided chest pain has eased and denies any previous episodes of angina.  HPI  Past Medical History:  Diagnosis Date  . Allergic rhinitis, cause unspecified 03/13/2013  . Anxiety   . Cerebrovascular disease, unspecified 03/13/2013   Atrophy and small vessel dz noted, MR brain 2009  . Depression   . Diabetes mellitus, type 2 ([place])   . Diverticulosis of colon 03/2010 hosp  . Dyslipidemia   . GERD (gastroesophageal reflux disease)   . Hypertension   . Neuropathy   . OSA on CPAP   . Osteoarthritis of shoulder region    and Knee  . Seizure disorder ([place])    onset 11 years ago; repeated 2013    Patient Active Problem List   Diagnosis Date Noted  . Subacute frontal sinusitis 11/16/2017  . Neck pain 07/21/2017  . Abdominal distension (gaseous) 04/10/2017  . Rash and nonspecific skin eruption 04/10/2017  . Pruritus 02/01/2017  . Decreased hearing of right ear 02/01/2017  . Primary osteoarthritis  of left knee 01/12/2017  . Left knee DJD 01/12/2017  . Constipation 01/10/2017  . Hypercalcemia 10/12/2016  . Chest pain 10/12/2016  . DOE (dyspnea on exertion) 10/12/2016  . Chronic back pain 08/23/2016  . Hair loss 08/23/2016  . Diabetes ([place]) 07/26/2016  . Anxiety 09/14/2015  . Muscle pain, myofacial 05/19/2015  . Diverticulitis large intestine w/o perforation or abscess w/o bleeding 05/12/2014  . Essential hypertension 05/12/2014  . DM neuropathy, type II diabetes mellitus ([place]) 05/12/2014  . Seizure disorder ([place]) 05/12/2014  . Hepatic steatosis 05/12/2014  . Obesity (BMI 30-39.9) 05/12/2014  . Cerebrovascular disease, unspecified 03/13/2013  . Allergic rhinitis 03/13/2013  . GOITER, MULTINODULAR 05/18/2009  . VERTIGO 04/13/2009  . Irritable bowel syndrome 03/20/2009  . INSOMNIA 03/20/2009  . Hyperlipidemia 03/18/2009  . GERD 03/18/2009  . Osteoarthritis 03/18/2009    Past Surgical History:  Procedure Laterality Date  . ABDOMINAL HYSTERECTOMY  1970's   Partial  . APPENDECTOMY    . CHOLECYSTECTOMY    . LUMBAR EPIDURAL INJECTION Left 08/25/2017  . SHOULDER SURGERY  2008   LT, post fall   . TONSILLECTOMY AND ADENOIDECTOMY    . TOTAL KNEE ARTHROPLASTY Left 01/12/2017   Procedure: LEFT TOTAL KNEE ARTHROPLASTY;  Surgeon: Susa Day, MD;  Location: [facility] ORS;  Service: Orthopedics;  Laterality: Left;  120 mins     OB History   None      Home Medications    Prior to Admission medications   Medication Sig Start Date End Date Taking? Authorizing Provider  ACCU-CHEK AVIVA PLUS test strip USE TO CHECK BLOOD SUGARS  TWO TIMES DAILY 12/26/17   Binnie Rail, MD  albuterol (PROAIR HFA) 108 (90 BASE) MCG/ACT inhaler Inhale 2 puffs into the lungs every 6 (six) hours as needed for wheezing or shortness of breath.     [provider]  Alpha-D-Galactosidase (BEANO PO) Take 1-2 tablets by mouth daily as needed (for gas).    [provider]  aspirin EC 325 MG  tablet Take 1 tablet (325 mg total) by mouth 2 (two) times daily. 01/12/17   Susa Day, MD  azelastine (ASTELIN) 0.1 % nasal spray Place 2 sprays into both nostrils 2 (two) times daily as needed for rhinitis. Use in each nostril as directed    [provider]  Biotin 5000 MCG TABS Take 5,000 mcg by mouth every other day.     [provider]  Blood Glucose Monitoring Suppl (ACCU-CHEK AVIVA PLUS) W/DEVICE KIT 1 kit by Does not apply route once. 11/13/12   Rowe Clack, MD  Cholecalciferol (EQL VITAMIN D3) 1000 UNITS tablet Take 1,000 Units by mouth every other day.     [provider]  clonazePAM (KLONOPIN) 0.5 MG tablet TAKE 1/2 TO 1 TABLET TWICE A DAY AS NEEDED FOR ANXIETY 02/14/18   Burns, Claudina Lick, MD  cyclobenzaprine (FLEXERIL) 5 MG tablet TAKE 1 TABLET (5 MG TOTAL) BY MOUTH 3 (THREE) TIMES DAILY AS NEEDED FOR MUSCLE SPASMS. 02/02/17   Burns, Claudina Lick, MD  docusate sodium (COLACE) 100 MG capsule Take 1 capsule (100 mg total) by mouth 2 (two) times daily as needed for mild constipation. 01/12/17   Susa Day, MD  ezetimibe (ZETIA) 10 MG tablet Take 1 tablet (10 mg total) by mouth daily. 07/24/17   Binnie Rail, MD  famotidine (PEPCID) 20 MG tablet TAKE 1 TABLET (20 MG TOTAL) BY MOUTH DAILY. Patient taking differently: TAKE 1 TABLET (20 MG TOTAL) BY MOUTH IN THE EVENING. 06/17/16   Burns, Claudina Lick, MD  gabapentin (NEURONTIN) 100 MG capsule TAKE 1 CAPSULE BY MOUTH THREE TIMES A DAY 01/12/18   Burns, Claudina Lick, MD  gabapentin (NEURONTIN) 800 MG tablet TAKE 1 TABLET BY MOUTH 3  TIMES DAILY 03/15/18   Binnie Rail, MD  glipiZIDE (GLUCOTROL XL) 5 MG 24 hr tablet TAKE 1 TABLET EVERY DAY 10/20/17   Binnie Rail, MD  hydrochlorothiazide (HYDRODIURIL) 25 MG tablet Take 1 tablet (25 mg total) by mouth daily. 03/22/18   Binnie Rail, MD  linaclotide [facility]) 145 MCG CAPS capsule Take 1 capsule (145 mcg total) by mouth daily before breakfast. 08/23/16   Burns, Claudina Lick, MD    metFORMIN (GLUCOPHAGE-XR) 500 MG 24 hr tablet TAKE 1 TABLET (500 MG TOTAL) BY MOUTH DAILY WITH BREAKFAST. 09/05/17   Binnie Rail, MD  Multiple Vitamin (MULTIVITAMIN) capsule Take 1 capsule by mouth every other day.     [provider]  Naphazoline-Glycerin-Zinc Sulf (CLEAR EYES MAXIMUM ITCHY EYE OP) Place 1 drop into both eyes every 8 (eight) hours as needed (itchy eyes).    [provider]  OXcarbazepine (TRILEPTAL) 150 MG tablet Take 1 tablet (150 mg total) by mouth 2 (two) times daily. 03/27/18   Patel, Arvin Collard K, DO  polyethylene glycol (MIRALAX / GLYCOLAX) packet Take 17 g by mouth daily. 01/12/17   Susa Day, MD  potassium chloride SA (KLOR-CON M20) 20 MEQ tablet Take 2 tablets (40 mEq total) by mouth daily. 09/12/17   Billey Gosling  J, MD  Probiotic Product (PROBIOTIC PO) Take 1 capsule by mouth daily after breakfast.     [provider]  ranitidine (ZANTAC) 150 MG tablet Take 1 tablet (150 mg total) by mouth 2 (two) times daily. Patient taking differently: Take 150 mg by mouth at bedtime as needed for heartburn.  09/14/15   Binnie Rail, MD  sucralfate (CARAFATE) 1 g tablet Take 1 tablet (1 g total) by mouth 4 (four) times daily -  with meals and at bedtime for 7 days. 04/14/18 04/21/18  Jean Rosenthal, MD  traMADol (ULTRAM) 50 MG tablet TAKE 1 TABLET EVERY 12 HOURS AS NEEDED 03/21/17   Binnie Rail, MD    Family History Family History  Problem Relation Age of Onset  . Arthritis Mother   . Heart disease Father   . Arthritis Other        Grandmother  . Diabetes Other        Grandmother    Social History Social History   Tobacco Use  . Smoking status: Former Smoker    Last attempt to quit: 10/19/1985    Years since quitting: 32.5  . Smokeless tobacco: Never Used  Substance Use Topics  . Alcohol use: Yes    Alcohol/week: 0.0 standard drinks    Comment: occasionally   . Drug use: No     Allergies   Cymbalta [duloxetine hcl]; Effexor  [venlafaxine]; Other; Statins; and Penicillins   Review of Systems Review of Systems  Constitutional: Negative for chills, diaphoresis and fever.  HENT: Positive for congestion, rhinorrhea, sinus pain and sneezing.   Eyes: Eye discharge: lacrimation.  Respiratory: Positive for chest tightness and shortness of breath.   Cardiovascular: Positive for chest pain (left sided-reporoducible ).  Gastrointestinal: Negative.   Endocrine: Negative.   Musculoskeletal: Negative.   Skin: Negative.   Neurological: Positive for dizziness and light-headedness.  Psychiatric/Behavioral: Negative.      Physical Exam Updated Vital Signs BP 129/71   Pulse (!) 59   Temp 98.7 F (37.1 C) (Oral)   Resp 20   Ht _0  (1.575 m)   Wt 80.3 kg   SpO2 100%   BMI 32.37 kg/m   Physical Exam  Constitutional: She appears well-developed and well-nourished.  Non-toxic appearance. She does not appear ill. No distress.  HENT:  Head: Normocephalic and atraumatic.  Neck: Normal range of motion. Neck supple.  Cardiovascular: Normal rate, regular rhythm and normal pulses.  Pulmonary/Chest: Effort normal and breath sounds normal. She has no decreased breath sounds. She has no wheezes. She has no rhonchi. She has no rales. She exhibits tenderness.  Tenderness to palpation    Abdominal: Soft. Bowel sounds are normal.  Neurological: She is alert.  Skin: Skin is warm.  Psychiatric: She has a normal mood and affect. Her behavior is normal.     ED Treatments / Results  Labs (all labs ordered are listed, but only abnormal results are displayed) Labs Reviewed  BASIC METABOLIC PANEL - Abnormal; Notable for the following components:      Result Value   Potassium 3.4 (*)    Chloride 96 (*)    Glucose, Bld 189 (*)    All other components within normal limits  CBC  I-STAT TROPONIN, ED  I-STAT TROPONIN, ED    EKG EKG Interpretation  Date/Time:  Saturday April 14 2018 11:10:10 EDT Ventricular Rate:   74 PR Interval:  150 QRS Duration: 80 QT Interval:  398 QTC Calculation: 441 R Axis:  55 Text Interpretation:  Normal sinus rhythm Cannot rule out Anterior infarct , age undetermined No significant change since last tracing Confirmed by Blanchie Dessert [id number]) on 04/14/2018 12:07:04 PM   Radiology Dg Chest 2 View  Result Date: 04/14/2018 CLINICAL DATA:  77 year old female with 3 days of chest pressure EXAM: CHEST - 2 VIEW COMPARISON:  03/25/2010 FINDINGS: Cardiomediastinal silhouette unchanged in size and contour. No pneumothorax. No pleural effusion. No confluent airspace disease. No acute displaced fracture. IMPRESSION: Negative for acute cardiopulmonary disease Electronically Signed   By: Corrie Mckusick D.O.   On: 04/14/2018 12:05    Procedures Procedures (including critical care time)  Medications Ordered in ED Medications  gi cocktail (Maalox,Lidocaine,Donnatal) (30 mLs Oral Given 04/14/18 1239)  ibuprofen (ADVIL,MOTRIN) tablet 400 mg (400 mg Oral Given 04/14/18 1239)  sucralfate (CARAFATE) tablet 1 g (1 g Oral Given 04/14/18 1531)  potassium chloride (K-DUR,KLOR-CON) CR tablet 30 mEq (30 mEq Oral Given 04/14/18 1531)     Initial Impression / Assessment and Plan / ED Course  I have reviewed the triage vital signs and the nursing notes.  Pertinent labs & imaging results that were available during my care of the patient were reviewed by me and considered in my medical decision making (see chart for details).   77 year old woman with medical history of hypertension, diabetes mellitus, GERD and hyperlipidemia presenting with a 3-day history of chest pressure with associated left-sided pleuritic chest pain after developing upper respiratory viral symptoms.  States her symptoms have chest pressure was particularly worse this morning after eating breakfast.  She does not report anginal chest pain.  Differential diagnosis includes costochondritis, upper respiratory viral infection,  pneumonia, GERD pulmonary embolism, myocardial infarction, aortic dissection.  Work-up in the ED included EKG which showed normal sinus rhythm with no ST or T wave abnormalities, troponin were negative x2, chest x-ray did not reveal pneumothorax or acute cardiopulmonary disease.  Pulmonary embolism less likely as patient is ambulatory and no personal history of DVT also denies lower extremity pain, swelling or calf tenderness.  While in the ED she received GI cocktail, Motrin and Carafate. On re-evaluation, she reported significant relief.   Reports that she has an appointment with cardiologist Dr. Tamala Julian on Tuesday.  Final Clinical Impressions(s) / ED Diagnoses   Final diagnoses:  Chest wall pain  Acute nasopharyngitis  Gastroesophageal reflux disease, esophagitis presence not specified    ED Discharge Orders         Ordered    sucralfate (CARAFATE) 1 g tablet  3 times daily with meals & bedtime     04/14/18 1632           Jean Rosenthal, MD 04/14/18 1633    Blanchie Dessert, MD 04/15/18 1338

## 2018-04-14 NOTE — ED Triage Notes (Signed)
Pt presents to ED for assessment of 3 days of chest pressure with more sharp pain to the chest today.  Patient c/o SOB, dizziness, nausea, GERD, arm, neck and back pain.

## 2018-04-14 NOTE — ED Notes (Signed)
Pt ambulated to RR .  

## 2018-04-14 NOTE — Discharge Instructions (Addendum)
Ms. Enslin,   It was a pleasure taking care of you here in the Ed. The labs we did showed no evidence of heart attack or severe lung disease. Some of the symptoms you are having is most likely because of the flu or acid reflux.  I am writing you a prescription for a short of a medication to help with the reflux. Also, I will advice that you decrease your intake of spicy or greasy food and also do not lay down right after your meals.   Since you have an appointment with your heart doctor on Tuesday, I will leave it up to them if they want to order some more tests on you.   ~Take Care Dr. Eileen Stanford

## 2018-04-14 NOTE — ED Notes (Signed)
ED Provider at bedside. 
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
## 2018-04-17 ENCOUNTER — Ambulatory Visit (INDEPENDENT_AMBULATORY_CARE_PROVIDER_SITE_OTHER): Payer: Medicare Other | Admitting: Interventional Cardiology

## 2018-04-17 ENCOUNTER — Encounter

## 2018-04-17 ENCOUNTER — Encounter: Payer: Self-pay | Admitting: Interventional Cardiology

## 2018-04-17 VITALS — BP 128/82 | HR 74 | Ht 62.0 in | Wt 184.0 lb

## 2018-04-17 DIAGNOSIS — E118 Type 2 diabetes mellitus with unspecified complications: Secondary | ICD-10-CM | POA: Diagnosis not present

## 2018-04-17 DIAGNOSIS — I10 Essential (primary) hypertension: Secondary | ICD-10-CM

## 2018-04-17 DIAGNOSIS — IMO0002 Reserved for concepts with insufficient information to code with codable children: Secondary | ICD-10-CM

## 2018-04-17 DIAGNOSIS — E782 Mixed hyperlipidemia: Secondary | ICD-10-CM | POA: Diagnosis not present

## 2018-04-17 DIAGNOSIS — E1165 Type 2 diabetes mellitus with hyperglycemia: Secondary | ICD-10-CM

## 2018-04-17 DIAGNOSIS — R079 Chest pain, unspecified: Secondary | ICD-10-CM

## 2018-04-17 MED ORDER — METOPROLOL TARTRATE 50 MG PO TABS: ORAL_TABLET | ORAL | 0 refills | Status: DC

## 2018-04-17 MED ORDER — ASPIRIN EC 81 MG PO TBEC: 81.0000 mg | DELAYED_RELEASE_TABLET | Freq: Every day | ORAL | 3 refills | Status: AC

## 2018-04-17 NOTE — Patient Instructions (Addendum)
Medication Instructions:  1) DECREASE Aspirin to 81mg  once daily.  If you need a refill on your cardiac medications before your next appointment, please call your pharmacy.   Lab work: You will need a BMP prior to your CT  If you have labs (blood work) drawn today and your tests are completely normal, you will receive your results only by: Marland Kitchen MyChart Message (if you have MyChart) OR . A paper copy in the mail If you have any lab test that is abnormal or we need to change your treatment, we will call you to review the results.  Testing/Procedures: Your provider recommends that you have a Coronary Ct.  Follow-Up: Your physician recommends that you schedule a follow-up appointment as needed with Dr. Tamala Julian.   Any Other Special Instructions Will Be Listed Below (If Applicable).  Please arrive at the [facility] main entrance of [facility] 30-45 minutes prior to test start time  [facility] [place], [place] [address] [phone]  Proceed to the [facility] Radiology Department (First Floor).  Please follow these instructions carefully (unless otherwise directed):  On the Night Before the Test: . Be sure to Drink plenty of water. . Do not consume any caffeinated/decaffeinated beverages or chocolate 12 hours prior to your test. . Do not take any antihistamines 12 hours prior to your test. . If you take Metformin do not take 24 hours prior to test.  On the Day of the Test: . Drink plenty of water. Do not drink any water within one hour of the test. . Do not eat any food 4 hours prior to the test. . You may take your regular medications prior to the test.  . Take metoprolol (Lopressor) two hours prior to test. . HOLD Furosemide/Hydrochlorothiazide morning of the test.       After the Test: . Drink plenty of water. . After receiving IV contrast, you may experience a mild flushed feeling. This is normal. . On occasion, you may experience a  mild rash up to 24 hours after the test. This is not dangerous. If this occurs, you can take Benadryl 25 mg and increase your fluid intake. . If you experience trouble breathing, this can be serious. If it is severe call 911 IMMEDIATELY. If it is mild, please call our office. . If you take any of these medications: Glipizide/Metformin, Avandament, Glucavance, please do not take 48 hours after completing test.

## 2018-04-24 ENCOUNTER — Telehealth: Payer: Self-pay | Admitting: Interventional Cardiology

## 2018-04-24 NOTE — Telephone Encounter (Signed)
New Message         Patient is calling today because she is concerned about the CT test that's coming up. Pls call and advise.

## 2018-04-24 NOTE — Telephone Encounter (Signed)
Pt states that she has not heard from anyone about scheduling her CT.  Advised it can take 2-3 weeks to hear back from the insurance on approval.  Pt verbalized understanding and was appreciative for call.

## 2018-05-11 ENCOUNTER — Other Ambulatory Visit: Payer: Self-pay | Admitting: Internal Medicine

## 2018-05-17 ENCOUNTER — Telehealth: Payer: Self-pay | Admitting: Interventional Cardiology

## 2018-05-17 NOTE — Telephone Encounter (Signed)
New Message   Patient is calling because she has questions about her upcoming CT. Please call to discuss.

## 2018-05-17 NOTE — Telephone Encounter (Signed)
Spoke with pt and answered all questions concerning CT.  Pt appreciative for call.

## 2018-05-18 ENCOUNTER — Ambulatory Visit (HOSPITAL_COMMUNITY)
Admission: RE | Admit: 2018-05-18 | Discharge: 2018-05-18 | Disposition: A | Discharge status: Home / Self Care | Payer: Medicare Other | Source: Ambulatory Visit | Attending: Interventional Cardiology | Admitting: Interventional Cardiology

## 2018-05-18 ENCOUNTER — Ambulatory Visit (HOSPITAL_COMMUNITY): Payer: Medicare Other

## 2018-05-18 DIAGNOSIS — Z87891 Personal history of nicotine dependence: Secondary | ICD-10-CM | POA: Insufficient documentation

## 2018-05-18 DIAGNOSIS — I2510 Atherosclerotic heart disease of native coronary artery without angina pectoris: Secondary | ICD-10-CM | POA: Insufficient documentation

## 2018-05-18 DIAGNOSIS — E119 Type 2 diabetes mellitus without complications: Secondary | ICD-10-CM | POA: Insufficient documentation

## 2018-05-18 DIAGNOSIS — I10 Essential (primary) hypertension: Secondary | ICD-10-CM | POA: Diagnosis not present

## 2018-05-18 DIAGNOSIS — R079 Chest pain, unspecified: Secondary | ICD-10-CM | POA: Insufficient documentation

## 2018-05-18 MED ORDER — NITROGLYCERIN 0.4 MG SL SUBL
SUBLINGUAL_TABLET | SUBLINGUAL | Status: AC
  Filled 2018-05-18: qty 2

## 2018-05-18 MED ORDER — NITROGLYCERIN 0.4 MG SL SUBL
0.8000 mg | SUBLINGUAL_TABLET | SUBLINGUAL | Status: DC | PRN
  Administered 2018-05-18: 0.8 mg via SUBLINGUAL
  Filled 2018-05-18 (×2): qty 25

## 2018-05-18 MED ORDER — IOPAMIDOL (ISOVUE-370) INJECTION 76%
100.0000 mL | Freq: Once | INTRAVENOUS | Status: AC | PRN
  Administered 2018-05-18: 100 mL via INTRAVENOUS

## 2018-05-23 IMAGING — DX DG KNEE 1-2V PORT*L*
2 series · 2 of 2 positions shown · non-contrast
Comparison: 12/30/2015 .

CLINICAL DATA: Left knee postop.

EXAM:
PORTABLE LEFT KNEE - 1-2 VIEW

[knee ap]
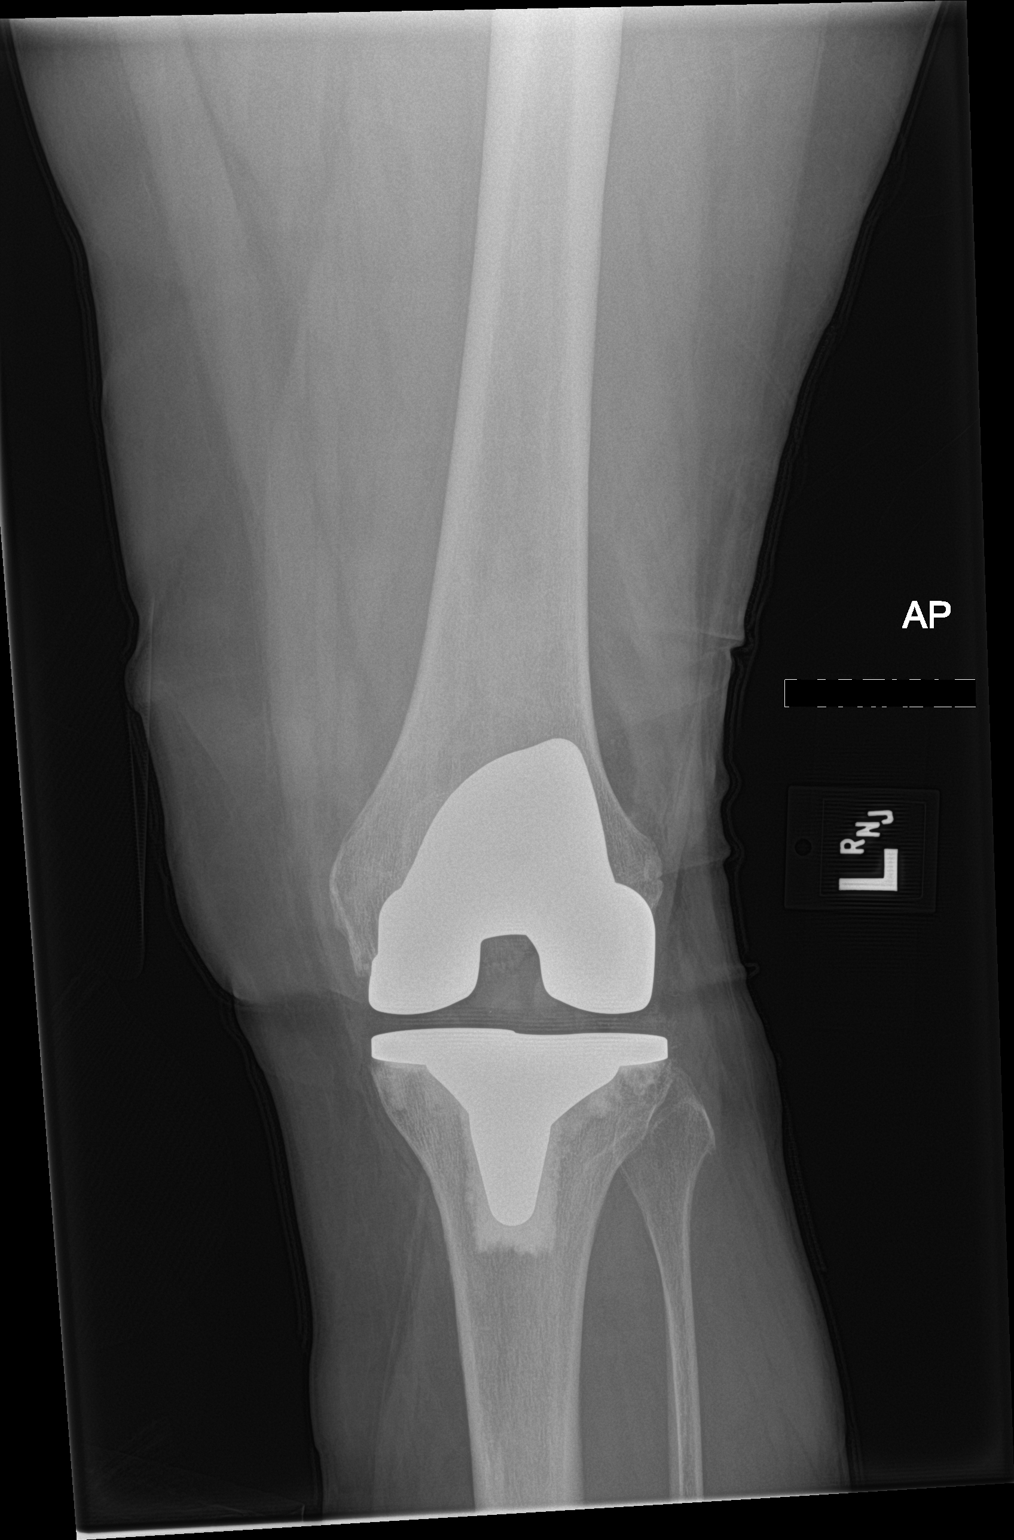

[knee lat]
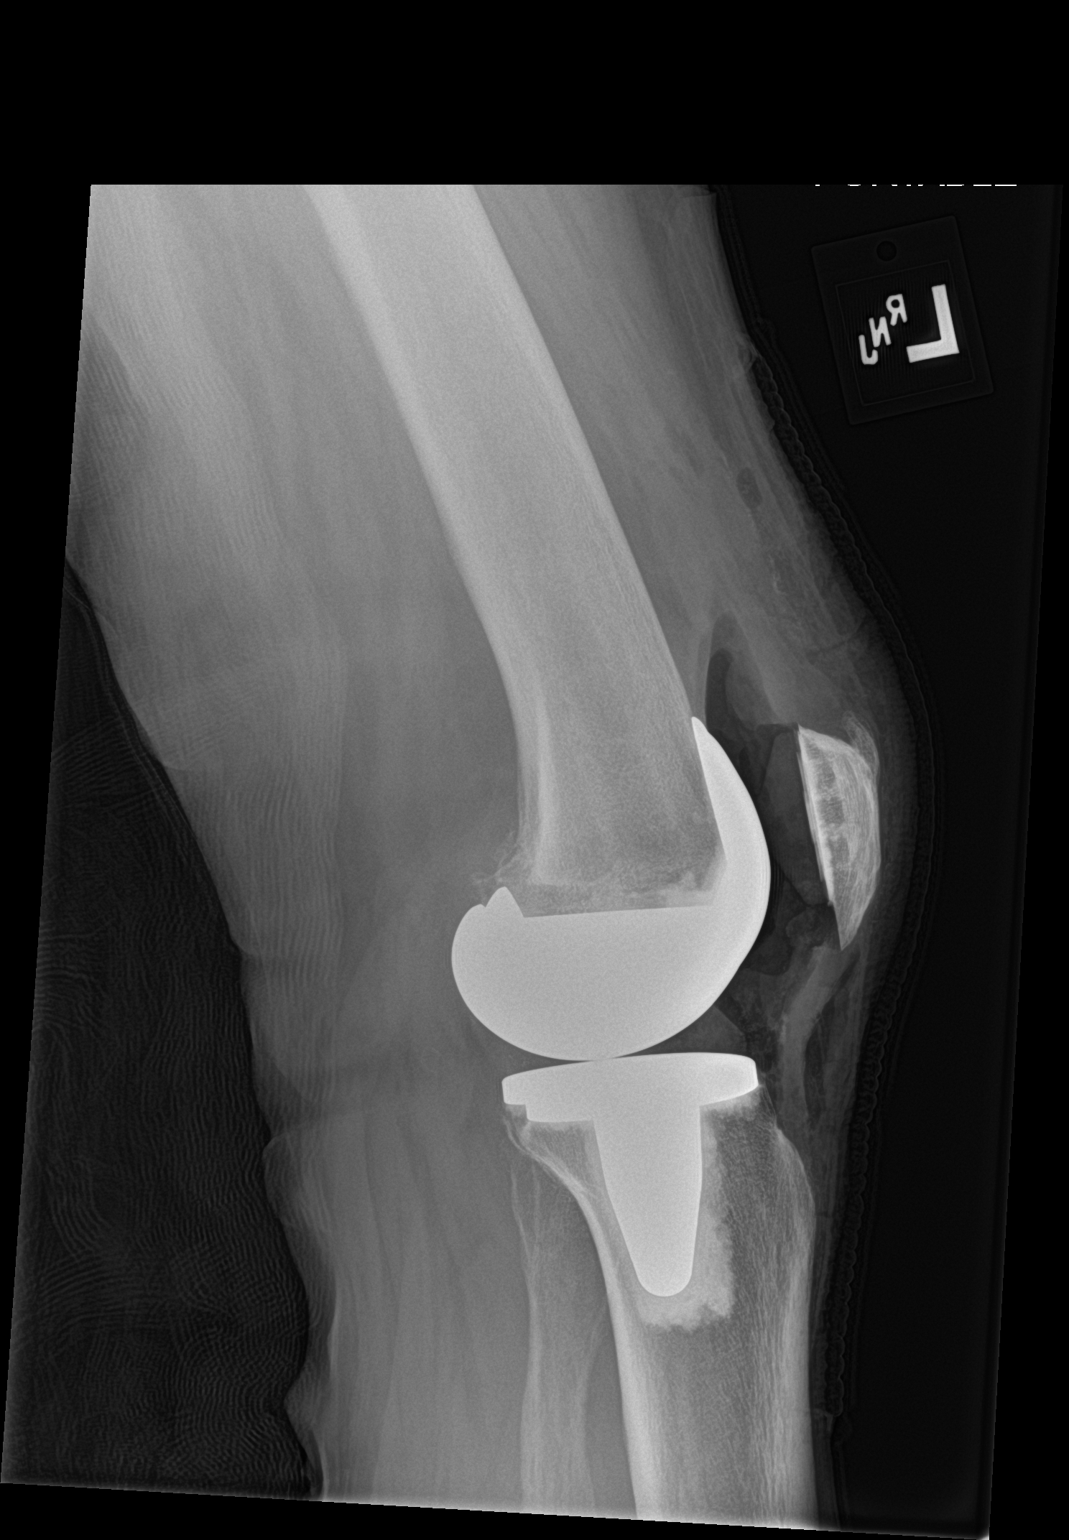

[2 of 2 positions shown; findings below may reference images not displayed]

FINDINGS: Patient is status post total left knee replacement. Anatomic
alignment . Hardware intact. No evidence of fracture. No acute
abnormality.
IMPRESSION: Total left knee replacement. Anatomic alignment. No acute
abnormality.

## 2018-05-26 ENCOUNTER — Emergency Department (HOSPITAL_COMMUNITY): Payer: Medicare Other

## 2018-05-26 ENCOUNTER — Other Ambulatory Visit: Payer: Self-pay

## 2018-05-26 ENCOUNTER — Encounter (HOSPITAL_COMMUNITY): Payer: Self-pay | Admitting: Emergency Medicine

## 2018-05-26 ENCOUNTER — Inpatient Hospital Stay (HOSPITAL_COMMUNITY)
Admission: EM | Admit: 2018-05-26 | Discharge: 2018-05-28 | DRG: 287 | Disposition: A | Discharge status: Home / Self Care | Payer: Medicare Other | Attending: Interventional Cardiology | Admitting: Interventional Cardiology

## 2018-05-26 DIAGNOSIS — J449 Chronic obstructive pulmonary disease, unspecified: Secondary | ICD-10-CM | POA: Diagnosis not present

## 2018-05-26 DIAGNOSIS — Z23 Encounter for immunization: Secondary | ICD-10-CM | POA: Diagnosis not present

## 2018-05-26 DIAGNOSIS — R079 Chest pain, unspecified: Secondary | ICD-10-CM | POA: Diagnosis present

## 2018-05-26 DIAGNOSIS — Z8249 Family history of ischemic heart disease and other diseases of the circulatory system: Secondary | ICD-10-CM | POA: Diagnosis not present

## 2018-05-26 DIAGNOSIS — E1140 Type 2 diabetes mellitus with diabetic neuropathy, unspecified: Secondary | ICD-10-CM | POA: Diagnosis present

## 2018-05-26 DIAGNOSIS — Z7982 Long term (current) use of aspirin: Secondary | ICD-10-CM | POA: Diagnosis not present

## 2018-05-26 DIAGNOSIS — R931 Abnormal findings on diagnostic imaging of heart and coronary circulation: Secondary | ICD-10-CM

## 2018-05-26 DIAGNOSIS — Z7984 Long term (current) use of oral hypoglycemic drugs: Secondary | ICD-10-CM

## 2018-05-26 DIAGNOSIS — G40909 Epilepsy, unspecified, not intractable, without status epilepticus: Secondary | ICD-10-CM | POA: Diagnosis not present

## 2018-05-26 DIAGNOSIS — Z96652 Presence of left artificial knee joint: Secondary | ICD-10-CM | POA: Diagnosis not present

## 2018-05-26 DIAGNOSIS — Z90710 Acquired absence of both cervix and uterus: Secondary | ICD-10-CM

## 2018-05-26 DIAGNOSIS — Z79899 Other long term (current) drug therapy: Secondary | ICD-10-CM

## 2018-05-26 DIAGNOSIS — Z833 Family history of diabetes mellitus: Secondary | ICD-10-CM | POA: Diagnosis not present

## 2018-05-26 DIAGNOSIS — I10 Essential (primary) hypertension: Secondary | ICD-10-CM | POA: Diagnosis not present

## 2018-05-26 DIAGNOSIS — I25119 Atherosclerotic heart disease of native coronary artery with unspecified angina pectoris: Principal | ICD-10-CM | POA: Diagnosis present

## 2018-05-26 DIAGNOSIS — Z87891 Personal history of nicotine dependence: Secondary | ICD-10-CM | POA: Diagnosis not present

## 2018-05-26 DIAGNOSIS — R0789 Other chest pain: Secondary | ICD-10-CM | POA: Diagnosis present

## 2018-05-26 DIAGNOSIS — E785 Hyperlipidemia, unspecified: Secondary | ICD-10-CM | POA: Diagnosis not present

## 2018-05-26 DIAGNOSIS — I2510 Atherosclerotic heart disease of native coronary artery without angina pectoris: Secondary | ICD-10-CM

## 2018-05-26 LAB — COMPREHENSIVE METABOLIC PANEL
ALT: 26 U/L (ref 0–44)
AST: 34 U/L (ref 15–41)
Albumin: 4 g/dL (ref 3.5–5.0)
Alkaline Phosphatase: 54 U/L (ref 38–126)
Anion gap: 15 (ref 5–15)
BUN: 10 mg/dL (ref 8–23)
CO2: 26 mmol/L (ref 22–32)
Calcium: 10.1 mg/dL (ref 8.9–10.3)
Chloride: 94 mmol/L — ABNORMAL LOW (ref 98–111)
Creatinine, Ser: 0.79 mg/dL (ref 0.44–1.00)
GFR calc Af Amer: >60 mL/min (ref >60)
GFR calc non Af Amer: >60 mL/min (ref >60)
Glucose, Bld: 169 mg/dL — ABNORMAL HIGH (ref 70–99)
POTASSIUM: 3.1 mmol/L — AB (ref 3.5–5.1)
Sodium: 135 mmol/L (ref 135–145)
Total Bilirubin: 0.6 mg/dL (ref 0.3–1.2)
Total Protein: 7.6 g/dL (ref 6.5–8.1)

## 2018-05-26 LAB — CBC WITH DIFFERENTIAL/PLATELET
Abs Immature Granulocytes: 0.02 10*3/uL (ref 0.00–0.07)
Basophils Absolute: 0 10*3/uL (ref 0.0–0.1)
Basophils Relative: 1 %
Eosinophils Absolute: 0.1 10*3/uL (ref 0.0–0.5)
Eosinophils Relative: 2 %
HCT: 39.9 % (ref 36.0–46.0)
Hemoglobin: 13 g/dL (ref 12.0–15.0)
Immature Granulocytes: 0 %
Lymphocytes Relative: 45 %
Lymphs Abs: 2.7 10*3/uL (ref 0.7–4.0)
MCH: 29.8 pg (ref 26.0–34.0)
MCHC: 32.6 g/dL (ref 30.0–36.0)
MCV: 91.5 fL (ref 80.0–100.0)
Monocytes Absolute: 0.5 10*3/uL (ref 0.1–1.0)
Monocytes Relative: 8 %
NRBC: 0 % (ref 0.0–0.2)
Neutro Abs: 2.6 10*3/uL (ref 1.7–7.7)
Neutrophils Relative %: 44 %
Platelets: 278 10*3/uL (ref 150–400)
RBC: 4.36 MIL/uL (ref 3.87–5.11)
RDW: 12.8 % (ref 11.5–15.5)
WBC: 6 10*3/uL (ref 4.0–10.5)

## 2018-05-26 LAB — CBC
HCT: 40.7 % (ref 36.0–46.0)
Hemoglobin: 13.2 g/dL (ref 12.0–15.0)
MCH: 29.9 pg (ref 26.0–34.0)
MCHC: 32.4 g/dL (ref 30.0–36.0)
MCV: 92.1 fL (ref 80.0–100.0)
Platelets: 272 10*3/uL (ref 150–400)
RBC: 4.42 MIL/uL (ref 3.87–5.11)
RDW: 13 % (ref 11.5–15.5)
WBC: 8.3 10*3/uL (ref 4.0–10.5)
nRBC: 0 % (ref 0.0–0.2)

## 2018-05-26 LAB — MRSA PCR SCREENING: MRSA by PCR: NEGATIVE

## 2018-05-26 LAB — I-STAT TROPONIN, ED: Troponin i, poc: 0 ng/mL (ref 0.00–0.08)

## 2018-05-26 LAB — TROPONIN I
Troponin I: <0.03 ng/mL (ref <0.03)
Troponin I: <0.03 ng/mL (ref <0.03)

## 2018-05-26 LAB — CREATININE, SERUM
Creatinine, Ser: 0.89 mg/dL (ref 0.44–1.00)
GFR calc Af Amer: >60 mL/min (ref >60)
GFR calc non Af Amer: >60 mL/min (ref >60)

## 2018-05-26 MED ORDER — ADULT MULTIVITAMIN W/MINERALS CH
1.0000 | ORAL_TABLET | Freq: Every day | ORAL | Status: DC
  Administered 2018-05-27 – 2018-05-28 (×2): 1 via ORAL
  Filled 2018-05-26 (×2): qty 1

## 2018-05-26 MED ORDER — KETOROLAC TROMETHAMINE 15 MG/ML IJ SOLN
15.0000 mg | Freq: Once | INTRAMUSCULAR | Status: AC
  Administered 2018-05-26: 15 mg via INTRAVENOUS
  Filled 2018-05-26: qty 1

## 2018-05-26 MED ORDER — ASPIRIN 81 MG PO CHEW
81.0000 mg | CHEWABLE_TABLET | ORAL | Status: AC
  Filled 2018-05-26: qty 1

## 2018-05-26 MED ORDER — ALBUTEROL SULFATE (2.5 MG/3ML) 0.083% IN NEBU: 2.5000 mg | INHALATION_SOLUTION | Freq: Four times a day (QID) | RESPIRATORY_TRACT | Status: DC | PRN

## 2018-05-26 MED ORDER — EZETIMIBE 10 MG PO TABS
10.0000 mg | ORAL_TABLET | Freq: Every day | ORAL | Status: DC
  Administered 2018-05-26 – 2018-05-28 (×3): 10 mg via ORAL
  Filled 2018-05-26 (×3): qty 1

## 2018-05-26 MED ORDER — SODIUM CHLORIDE 0.9% FLUSH
3.0000 mL | Freq: Two times a day (BID) | INTRAVENOUS | Status: DC
  Administered 2018-05-26 – 2018-05-27 (×3): 3 mL via INTRAVENOUS

## 2018-05-26 MED ORDER — FAMOTIDINE 20 MG PO TABS
20.0000 mg | ORAL_TABLET | Freq: Every day | ORAL | Status: DC
  Administered 2018-05-27 – 2018-05-28 (×2): 20 mg via ORAL
  Filled 2018-05-26 (×2): qty 1

## 2018-05-26 MED ORDER — CLONAZEPAM 0.5 MG PO TABS
0.2500 mg | ORAL_TABLET | Freq: Two times a day (BID) | ORAL | Status: DC | PRN
  Administered 2018-05-28: 0.25 mg via ORAL
  Filled 2018-05-26 (×2): qty 1

## 2018-05-26 MED ORDER — AZELASTINE HCL 0.1 % NA SOLN
2.0000 | Freq: Two times a day (BID) | NASAL | Status: DC | PRN
  Filled 2018-05-26: qty 30

## 2018-05-26 MED ORDER — SODIUM CHLORIDE 0.9% FLUSH: 3.0000 mL | INTRAVENOUS | Status: DC | PRN

## 2018-05-26 MED ORDER — OXCARBAZEPINE 150 MG PO TABS
150.0000 mg | ORAL_TABLET | Freq: Two times a day (BID) | ORAL | Status: DC
  Administered 2018-05-26 – 2018-05-28 (×3): 150 mg via ORAL
  Filled 2018-05-26 (×4): qty 1

## 2018-05-26 MED ORDER — SODIUM CHLORIDE 0.9 % WEIGHT BASED INFUSION: 1.0000 mL/kg/h | INTRAVENOUS | Status: DC

## 2018-05-26 MED ORDER — NITROGLYCERIN 0.4 MG SL SUBL
0.4000 mg | SUBLINGUAL_TABLET | SUBLINGUAL | Status: DC | PRN
  Administered 2018-05-26 – 2018-05-28 (×3): 0.4 mg via SUBLINGUAL
  Filled 2018-05-26 (×2): qty 1

## 2018-05-26 MED ORDER — GABAPENTIN 800 MG PO TABS
800.0000 mg | ORAL_TABLET | Freq: Three times a day (TID) | ORAL | Status: DC
  Filled 2018-05-26: qty 1

## 2018-05-26 MED ORDER — ASPIRIN 81 MG PO CHEW
324.0000 mg | CHEWABLE_TABLET | Freq: Once | ORAL | Status: AC
  Administered 2018-05-26: 324 mg via ORAL
  Filled 2018-05-26: qty 4

## 2018-05-26 MED ORDER — MORPHINE SULFATE (PF) 4 MG/ML IV SOLN
4.0000 mg | Freq: Once | INTRAVENOUS | Status: AC
  Administered 2018-05-26: 4 mg via INTRAVENOUS
  Filled 2018-05-26: qty 1

## 2018-05-26 MED ORDER — SODIUM CHLORIDE 0.9 % WEIGHT BASED INFUSION: 3.0000 mL/kg/h | INTRAVENOUS | Status: AC

## 2018-05-26 MED ORDER — ASPIRIN EC 81 MG PO TBEC
81.0000 mg | DELAYED_RELEASE_TABLET | Freq: Every day | ORAL | Status: DC
  Administered 2018-05-27 – 2018-05-28 (×2): 81 mg via ORAL
  Filled 2018-05-26 (×2): qty 1

## 2018-05-26 MED ORDER — ONDANSETRON HCL 4 MG/2ML IJ SOLN: 4.0000 mg | Freq: Four times a day (QID) | INTRAMUSCULAR | Status: DC | PRN

## 2018-05-26 MED ORDER — GABAPENTIN 400 MG PO CAPS
800.0000 mg | ORAL_CAPSULE | Freq: Three times a day (TID) | ORAL | Status: DC
  Administered 2018-05-26 – 2018-05-28 (×5): 800 mg via ORAL
  Filled 2018-05-26 (×5): qty 2

## 2018-05-26 MED ORDER — SODIUM CHLORIDE 0.9 % IV SOLN: 250.0000 mL | INTRAVENOUS | Status: DC | PRN

## 2018-05-26 MED ORDER — INFLUENZA VAC SPLIT HIGH-DOSE 0.5 ML IM SUSY
0.5000 mL | PREFILLED_SYRINGE | INTRAMUSCULAR | Status: DC
  Filled 2018-05-26: qty 0.5

## 2018-05-26 MED ORDER — ENOXAPARIN SODIUM 40 MG/0.4ML ~~LOC~~ SOLN
40.0000 mg | SUBCUTANEOUS | Status: DC
  Administered 2018-05-26 – 2018-05-27 (×2): 40 mg via SUBCUTANEOUS
  Filled 2018-05-26 (×2): qty 0.4

## 2018-05-26 MED ORDER — NITROGLYCERIN IN D5W 200-5 MCG/ML-% IV SOLN
0.0000 ug/min | INTRAVENOUS | Status: DC
  Filled 2018-05-26: qty 250

## 2018-05-26 MED ORDER — GLIPIZIDE ER 5 MG PO TB24
5.0000 mg | ORAL_TABLET | Freq: Every day | ORAL | Status: DC
  Administered 2018-05-27: 5 mg via ORAL
  Filled 2018-05-26 (×3): qty 1

## 2018-05-26 MED ORDER — INSULIN ASPART 100 UNIT/ML ~~LOC~~ SOLN
0.0000 [IU] | Freq: Three times a day (TID) | SUBCUTANEOUS | Status: DC
  Administered 2018-05-28: 2 [IU] via SUBCUTANEOUS

## 2018-05-26 MED ORDER — ONDANSETRON HCL 4 MG/2ML IJ SOLN
4.0000 mg | Freq: Once | INTRAMUSCULAR | Status: AC
  Administered 2018-05-26: 4 mg via INTRAVENOUS
  Filled 2018-05-26: qty 2

## 2018-05-26 MED ORDER — ALBUTEROL SULFATE HFA 108 (90 BASE) MCG/ACT IN AERS
2.0000 | INHALATION_SPRAY | Freq: Four times a day (QID) | RESPIRATORY_TRACT | Status: DC | PRN
  Filled 2018-05-26: qty 6.7

## 2018-05-26 MED ORDER — ACETAMINOPHEN 325 MG PO TABS: 650.0000 mg | ORAL_TABLET | ORAL | Status: DC | PRN

## 2018-05-26 NOTE — ED Provider Notes (Signed)
Care assumed from previous provider PA Joy. Please see note for further details. Case discussed, plan agreed upon. Will follow up on cardiology recommendations.   Briefly, patient is a 77 y.o. female who presented to the emergency department for left-sided constant chest pain which began around 830 this morning.  She is actually scheduled for a cardiac catheterization with Dr. Tamala Julian on Wednesday.  First troponin was negative and EKG with no change from previous per previous provider.  PA Joy spoke with cardiology, Dr. Domenic Polite.  Cardiology will evaluate patient, but has several people to see ahead of her, therefore may take some time for them to see her.  She did have some improvement with nitro, therefore is currently on a nitro drip.  She is already gotten aspirin.  Anticipate admission.  Cardiology has evaluated patient and will admit.   [name], Ozella Almond, PA-C 05/26/18 1522    Quintella Reichert, MD 05/26/18 1910

## 2018-05-26 NOTE — ED Triage Notes (Signed)
Pt is scheduled for a heart cath. Reports worsening chest pain.

## 2018-05-26 NOTE — ED Triage Notes (Signed)
Pt reports constant l side chest pain. Reports heart cath scheduled for Wednesday. Reports pain worsened over night.

## 2018-05-26 NOTE — ED Notes (Signed)
Cardiology advises not to give Nitro drip.

## 2018-05-26 NOTE — H&P (Signed)
Cardiology Consultation:   Patient ID: [name] MRN: [id number]; DOB: 1941-05-23  Admit date: 05/26/2018 Date of Consult: 05/26/2018  Primary Care Provider: Binnie Rail, MD Primary Cardiologist: Sinclair Grooms, MD  Primary Electrophysiologist:  None    Patient Profile:   [name] is a 77 y.o. female with a hx of DM2, tobacco, HTN, COPD who is being seen today for the evaluation of chest pain at the request of Shawn Joy.  History of Present Illness:   Ms. [name] is a 77 year old female with a history significant for small vessel cerebrovascular disease, type 2 diabetes, tobacco abuse, hypertension, and COPD who presented to the hospital with chest pain.  She was scheduled to have a left heart catheterization on 05/30/2018.  She had a coronary CT that showed significant diagonal disease.  He presented to the emergency room with chest pain around 8:30 AM upon awakening.  She describes it as a left-sided heaviness radiating inferiorly into her left shoulder, 10 out of 10 associated with shortness of breath and nausea.  Troponin is initially negative.  Past Medical History:  Diagnosis Date  . Allergic rhinitis, cause unspecified 03/13/2013  . Anxiety   . Cerebrovascular disease, unspecified 03/13/2013   Atrophy and small vessel dz noted, MR brain 2009  . Depression   . Diabetes mellitus, type 2 ([place])   . Diverticulosis of colon 03/2010 hosp  . Dyslipidemia   . GERD (gastroesophageal reflux disease)   . Hypertension   . Neuropathy   . OSA on CPAP   . Osteoarthritis of shoulder region    and Knee  . Seizure disorder ([place])    onset 11 years ago; repeated 2013    Past Surgical History:  Procedure Laterality Date  . ABDOMINAL HYSTERECTOMY  1970's   Partial  . APPENDECTOMY    . CHOLECYSTECTOMY    . LUMBAR EPIDURAL INJECTION Left 08/25/2017  . SHOULDER SURGERY  2008   LT, post fall   . TONSILLECTOMY AND ADENOIDECTOMY    . TOTAL KNEE ARTHROPLASTY Left 01/12/2017   Procedure: LEFT TOTAL KNEE ARTHROPLASTY;  Surgeon: Susa Day, MD;  Location: [facility];  Service: Orthopedics;  Laterality: Left;  120 mins     Home Medications:  Prior to Admission medications   Medication Sig Start Date End Date Taking? Authorizing Provider  ACCU-CHEK AVIVA PLUS test strip USE TO CHECK BLOOD SUGARS  TWO TIMES DAILY 12/26/17   Binnie Rail, MD  albuterol (PROAIR HFA) 108 (90 BASE) MCG/ACT inhaler Inhale 2 puffs into the lungs every 6 (six) hours as needed for wheezing or shortness of breath.     [provider]  Alpha-D-Galactosidase (BEANO PO) Take 1-2 tablets by mouth daily as needed (for gas).    [provider]  aspirin EC 81 MG tablet Take 1 tablet (81 mg total) by mouth daily. 04/17/18   Belva Crome, MD  azelastine (ASTELIN) 0.1 % nasal spray Place 2 sprays into both nostrils 2 (two) times daily as needed for rhinitis. Use in each nostril as directed    [provider]  Blood Glucose Monitoring Suppl (ACCU-CHEK AVIVA PLUS) W/DEVICE KIT 1 kit by Does not apply route once. 11/13/12   Rowe Clack, MD  clonazePAM (KLONOPIN) 0.5 MG tablet TAKE 1/2 TO 1 TABLET TWICE A DAY AS NEEDED FOR ANXIETY Patient taking differently: Take 0.25-0.5 mg by mouth 2 (two) times daily as needed for anxiety.  02/14/18   Binnie Rail, MD  docusate  sodium (COLACE) 100 MG capsule Take 1 capsule (100 mg total) by mouth 2 (two) times daily as needed for mild constipation. Patient not taking: Reported on 05/18/2018 01/12/17   Susa Day, MD  ezetimibe (ZETIA) 10 MG tablet Take 1 tablet (10 mg total) by mouth daily. 07/24/17   Binnie Rail, MD  famotidine (PEPCID) 20 MG tablet TAKE 1 TABLET (20 MG TOTAL) BY MOUTH DAILY. Patient taking differently: Take 20 mg by mouth daily.  06/17/16   Binnie Rail, MD  gabapentin (NEURONTIN) 100 MG capsule TAKE 1 CAPSULE BY MOUTH THREE TIMES A DAY Patient taking differently: Take 100 mg by mouth daily as needed (pain). TAKE 1  CAPSULE BY MOUTH THREE TIMES A DAY 01/12/18   Burns, Claudina Lick, MD  gabapentin (NEURONTIN) 800 MG tablet TAKE 1 TABLET BY MOUTH 3  TIMES DAILY Patient taking differently: Take 800 mg by mouth 3 (three) times daily.  03/15/18   Burns, Claudina Lick, MD  glipiZIDE (GLUCOTROL XL) 5 MG 24 hr tablet TAKE 1 TABLET BY MOUTH EVERY DAY Patient taking differently: Take 5 mg by mouth daily with breakfast.  05/11/18   Binnie Rail, MD  hydrochlorothiazide (HYDRODIURIL) 25 MG tablet Take 1 tablet (25 mg total) by mouth daily. 03/22/18   Binnie Rail, MD  metFORMIN (GLUCOPHAGE-XR) 500 MG 24 hr tablet TAKE 1 TABLET (500 MG TOTAL) BY MOUTH DAILY WITH BREAKFAST. Patient taking differently: Take 500 mg by mouth daily with breakfast.  09/05/17   Binnie Rail, MD  metoprolol tartrate (LOPRESSOR) 50 MG tablet Take one tablet by mouth 2 hours prior to your CT Patient not taking: Reported on 05/18/2018 04/17/18   Belva Crome, MD  Multiple Vitamin (MULTIVITAMIN) capsule Take 1 capsule by mouth daily.     [provider]  Naphazoline-Glycerin-Zinc Sulf (CLEAR EYES MAXIMUM ITCHY EYE OP) Place 1 drop into both eyes every 8 (eight) hours as needed (itchy eyes).    [provider]  OXcarbazepine (TRILEPTAL) 150 MG tablet Take 1 tablet (150 mg total) by mouth 2 (two) times daily. 03/27/18   Patel, Arvin Collard K, DO  polyethylene glycol (MIRALAX / GLYCOLAX) packet Take 17 g by mouth daily. Patient taking differently: Take 17 g by mouth 2 (two) times a week.  01/12/17   Susa Day, MD  potassium chloride SA (KLOR-CON M20) 20 MEQ tablet Take 2 tablets (40 mEq total) by mouth daily. Patient taking differently: Take 20 mEq by mouth 2 (two) times daily.  09/12/17   Binnie Rail, MD  Probiotic Product (PROBIOTIC PO) Take 1 capsule by mouth daily after breakfast.     [provider]  ranitidine (ZANTAC) 150 MG tablet Take 1 tablet (150 mg total) by mouth 2 (two) times daily. Patient not taking: Reported on  05/23/2018 09/14/15   Binnie Rail, MD  sucralfate (CARAFATE) 1 g tablet Take 1 tablet (1 g total) by mouth 4 (four) times daily -  with meals and at bedtime for 7 days. Patient not taking: Reported on 05/23/2018 04/14/18 04/21/18  Jean Rosenthal, MD  traMADol (ULTRAM) 50 MG tablet TAKE 1 TABLET EVERY 12 HOURS AS NEEDED Patient not taking: Reported on 05/18/2018 03/21/17   Binnie Rail, MD    Inpatient Medications: Scheduled Meds:  Continuous Infusions:  PRN Meds: nitroGLYCERIN  Allergies:    Allergies  Allergen Reactions  . Cymbalta [Duloxetine Hcl] Hives and Itching  . Effexor [Venlafaxine] Nausea Only  . Other     "  SEEDED" food due to stomach issues  . Statins Nausea And Vomiting  . Penicillins Hives and Rash    Has patient had a PCN reaction causing immediate rash, facial/tongue/throat swelling, SOB or lightheadedness with hypotension: Yes Has patient had a PCN reaction causing severe rash involving mucus membranes or skin necrosis: No Has patient had a PCN reaction that required hospitalization: No Has patient had a PCN reaction occurring within the last 10 years: Yes If all of the above answers are "NO", then may proceed with Cephalosporin use.     Social History:   Social History   Socioeconomic History  . Marital status: Widowed    Spouse name: Not on file  . Number of children: 2  . Years of education: Not on file  . Highest education level: Not on file  Occupational History  . Not on file  Social Needs  . Financial resource strain: Not hard at all  . Food insecurity:    Worry: Never true    Inability: Never true  . Transportation needs:    Medical: No    Non-medical: No  Tobacco Use  . Smoking status: Former Smoker    Last attempt to quit: 10/19/1985    Years since quitting: 32.6  . Smokeless tobacco: Never Used  Substance and Sexual Activity  . Alcohol use: Yes    Alcohol/week: 0.0 standard drinks    Comment: occasionally   . Drug use: No  .  Sexual activity: Never  Lifestyle  . Physical activity:    Days per week: 0 days    Minutes per session: 0 min  . Stress: Only a little  Relationships  . Social connections:    Talks on phone: More than three times a week    Gets together: More than three times a week    Attends religious service: More than 4 times per year    Active member of club or organization: Yes    Attends meetings of clubs or organizations: More than 4 times per year    Relationship status: Widowed  . Intimate partner violence:    Fear of current or ex partner: Not on file    Emotionally abused: Not on file    Physically abused: Not on file    Forced sexual activity: Not on file  Other Topics Concern  . Not on file  Social History Narrative   Patient lives in a one story home.  Has 2 children.  Retired from [employer].    Family History:    Family History  Problem Relation Age of Onset  . Arthritis Mother   . Heart disease Father   . Arthritis Other        Grandmother  . Diabetes Other        Grandmother     ROS:  Please see the history of present illness.   All other ROS reviewed and negative.     Physical Exam/Data:   Vitals:   05/26/18 1309 05/26/18 1330 05/26/18 1345 05/26/18 1400  BP: 131/74 133/69 121/69 115/64  Pulse: 72 78 71 74  Resp: _0 Temp: 98.4 F (36.9 C)     TempSrc: Oral     SpO2: 97% 98% 96% 94%  Weight:      Height:       No intake or output data in the 24 hours ending 05/26/18 1435 Filed Weights   05/26/18 1303  Weight: 83.5 kg   Body mass index is 33.65  kg/m.  General:  Well nourished, well developed, in no acute distress HEENT: normal Lymph: no adenopathy Neck: no JVD Endocrine:  No thryomegaly Vascular: No carotid bruits; FA pulses 2+ bilaterally without bruits  Cardiac:  normal S1, S2; RRR; no murmur  Lungs:  clear to auscultation bilaterally, no wheezing, rhonchi or rales  Abd: soft, nontender, no hepatomegaly  Ext: no edema Musculoskeletal:   No deformities, BUE and BLE strength normal and equal, pain with palpation of the anterior chest Skin: warm and dry  Neuro:  CNs 2-12 intact, no focal abnormalities noted Psych:  Normal affect   EKG:  The EKG was personally reviewed and demonstrates:  SR, lateral TWI Telemetry:  Telemetry was personally reviewed and demonstrates: Sinus rhythm  Relevant CV Studies: CT FFR 05/22/18 1. CT FFR analysis showed significant stenosis in the 1. diagonal artery but not in proximal LAD.  Laboratory Data:  Chemistry Recent Labs  Lab 05/26/18 1319  NA 135  K 3.1*  CL 94*  CO2 26  GLUCOSE 169*  BUN 10  CREATININE 0.79  CALCIUM 10.1  GFRNONAA >60  GFRAA >60  ANIONGAP 15    Recent Labs  Lab 05/26/18 1319  PROT 7.6  ALBUMIN 4.0  AST 34  ALT 26  ALKPHOS 54  BILITOT 0.6   Hematology Recent Labs  Lab 05/26/18 1319  WBC 6.0  RBC 4.36  HGB 13.0  HCT 39.9  MCV 91.5  MCH 29.8  MCHC 32.6  RDW 12.8  PLT 278   Cardiac Enzymes Recent Labs  Lab 05/26/18 1319  TROPONINI <0.03    Recent Labs  Lab 05/26/18 1329  TROPIPOC 0.00    BNPNo results for input(s): BNP, PROBNP in the last 168 hours.  DDimer No results for input(s): DDIMER in the last 168 hours.  Radiology/Studies:  Dg Chest Portable 1 View  Result Date: 05/26/2018 CLINICAL DATA:  Chest pain EXAM: PORTABLE CHEST 1 VIEW COMPARISON:  04/14/2018 FINDINGS: The heart size and mediastinal contours are within normal limits. Both lungs are clear. The visualized skeletal structures are unremarkable. IMPRESSION: No active disease. Electronically Signed   By: Franchot Gallo M.D.   On: 05/26/2018 13:40    Assessment and Plan:   1. Atypical chest pain: Currently with known stenosis of the first diagonal by CT FFR.  Had chest pain that awoke her from sleep.  Initial troponin is negative.  As she is coming with chest pain and does have significant coronary disease on her CT, we Znya Albino plan for catheterization on Monday.  That  being said, I do feel that this is likely noncardiac chest pain.    I would recommend left heart catheterization with possible PCI.  Discussed the cath with the patient. The patient understands that risks included but are not limited to stroke (1 in 1000), death (1 in 67), kidney failure [usually temporary] (1 in 500), bleeding (1 in 200), allergic reaction [possibly serious] (1 in 200). The patient understands and agrees to proceed.   2. Hypertension: Well-controlled today.  Continue with home medications. 3. Type 2 diabetes: We Denicia Pagliarulo hold metformin but continue other diabetic meds.  Keishawn Rajewski start sliding scale insulin.      For questions or updates, please contact [place] Please consult [email or web address] for contact info under     Signed, Cheridan Kibler Meredith Leeds, MD  05/26/2018 2:35 PM

## 2018-05-26 NOTE — ED Notes (Signed)
Attempted to give report twice, receiving RN requested an additional 81minutes.

## 2018-05-26 NOTE — Progress Notes (Signed)
Attempted to get report from [place], [place]

## 2018-05-26 NOTE — ED Notes (Signed)
ED Provider at bedside. 

## 2018-05-26 NOTE — ED Provider Notes (Signed)
[facility] EMERGENCY DEPARTMENT Provider Note   CSN: [id number] Arrival date & time: 05/26/18  1246     History   Chief Complaint Chief Complaint  Patient presents with  . Chest Pain    HPI [name] is a 77 y.o. female.  HPI   [name] is a 77 y.o. female, with a history of DM, HTN, dyslipidemia, GERD, anxiety, presenting to the ED with chest pain beginning last night, worse this morning.  Patient's pain this morning began around 8:30 AM, upon waking.  It is a left-sided chest heaviness, radiating inferiorly and to the left shoulder, 10/10.  Accompanied by shortness of breath and nausea.  She states she is scheduled for cardiac cath by Dr. Tamala Julian on December 4.  Denies fever, cough, vomiting, falls/trauma, syncope, diaphoresis, lower extremity edema/pain, abdominal pain, or any other complaints.    Past Medical History:  Diagnosis Date  . Allergic rhinitis, cause unspecified 03/13/2013  . Anxiety   . Cerebrovascular disease, unspecified 03/13/2013   Atrophy and small vessel dz noted, MR brain 2009  . Depression   . Diabetes mellitus, type 2 ([place])   . Diverticulosis of colon 03/2010 hosp  . Dyslipidemia   . GERD (gastroesophageal reflux disease)   . Hypertension   . Neuropathy   . OSA on CPAP   . Osteoarthritis of shoulder region    and Knee  . Seizure disorder ([place])    onset 11 years ago; repeated 2013    Patient Active Problem List   Diagnosis Date Noted  . Atypical chest pain 05/26/2018  . Subacute frontal sinusitis 11/16/2017  . Neck pain 07/21/2017  . Abdominal distension (gaseous) 04/10/2017  . Rash and nonspecific skin eruption 04/10/2017  . Pruritus 02/01/2017  . Decreased hearing of right ear 02/01/2017  . Primary osteoarthritis of left knee 01/12/2017  . Left knee DJD 01/12/2017  . Constipation 01/10/2017  . Hypercalcemia 10/12/2016  . Chest pain 10/12/2016  . DOE (dyspnea on exertion) 10/12/2016  . Chronic back pain  08/23/2016  . Hair loss 08/23/2016  . Diabetes ([place]) 07/26/2016  . Anxiety 09/14/2015  . Muscle pain, myofacial 05/19/2015  . Diverticulitis large intestine w/o perforation or abscess w/o bleeding 05/12/2014  . Essential hypertension 05/12/2014  . DM neuropathy, type II diabetes mellitus ([place]) 05/12/2014  . Seizure disorder ([place]) 05/12/2014  . Hepatic steatosis 05/12/2014  . Obesity (BMI 30-39.9) 05/12/2014  . Cerebrovascular disease, unspecified 03/13/2013  . Allergic rhinitis 03/13/2013  . GOITER, MULTINODULAR 05/18/2009  . VERTIGO 04/13/2009  . Irritable bowel syndrome 03/20/2009  . INSOMNIA 03/20/2009  . Hyperlipidemia 03/18/2009  . GERD 03/18/2009  . Osteoarthritis 03/18/2009    Past Surgical History:  Procedure Laterality Date  . ABDOMINAL HYSTERECTOMY  1970's   Partial  . APPENDECTOMY    . CHOLECYSTECTOMY    . LUMBAR EPIDURAL INJECTION Left 08/25/2017  . SHOULDER SURGERY  2008   LT, post fall   . TONSILLECTOMY AND ADENOIDECTOMY    . TOTAL KNEE ARTHROPLASTY Left 01/12/2017   Procedure: LEFT TOTAL KNEE ARTHROPLASTY;  Surgeon: Susa Day, MD;  Location: [facility];  Service: Orthopedics;  Laterality: Left;  120 mins     OB History   None      Home Medications    Prior to Admission medications   Medication Sig Start Date End Date Taking? Authorizing Provider  albuterol (PROAIR HFA) 108 (90 BASE) MCG/ACT inhaler Inhale 2 puffs into the lungs every 6 (six) hours as needed  for wheezing or shortness of breath.    Yes [provider]  Alpha-D-Galactosidase (BEANO PO) Take 1-2 tablets by mouth daily as needed (for gas).   Yes [provider]  aspirin EC 81 MG tablet Take 1 tablet (81 mg total) by mouth daily. 04/17/18  Yes Belva Crome, MD  azelastine (ASTELIN) 0.1 % nasal spray Place 2 sprays into both nostrils 2 (two) times daily as needed for rhinitis. Use in each nostril as directed   Yes [provider]  clonazePAM (KLONOPIN) 0.5 MG  tablet TAKE 1/2 TO 1 TABLET TWICE A DAY AS NEEDED FOR ANXIETY Patient taking differently: Take 0.25-0.5 mg by mouth 2 (two) times daily as needed for anxiety.  02/14/18  Yes Burns, Claudina Lick, MD  ezetimibe (ZETIA) 10 MG tablet Take 1 tablet (10 mg total) by mouth daily. 07/24/17  Yes Burns, Claudina Lick, MD  famotidine (PEPCID) 20 MG tablet TAKE 1 TABLET (20 MG TOTAL) BY MOUTH DAILY. Patient taking differently: Take 20 mg by mouth daily.  06/17/16  Yes Burns, Claudina Lick, MD  gabapentin (NEURONTIN) 800 MG tablet TAKE 1 TABLET BY MOUTH 3  TIMES DAILY Patient taking differently: Take 800 mg by mouth 3 (three) times daily.  03/15/18  Yes Burns, Claudina Lick, MD  glipiZIDE (GLUCOTROL XL) 5 MG 24 hr tablet TAKE 1 TABLET BY MOUTH EVERY DAY Patient taking differently: Take 5 mg by mouth daily with breakfast.  05/11/18  Yes Burns, Claudina Lick, MD  hydrochlorothiazide (HYDRODIURIL) 25 MG tablet Take 1 tablet (25 mg total) by mouth daily. 03/22/18  Yes Burns, Claudina Lick, MD  metFORMIN (GLUCOPHAGE-XR) 500 MG 24 hr tablet TAKE 1 TABLET (500 MG TOTAL) BY MOUTH DAILY WITH BREAKFAST. Patient taking differently: Take 500 mg by mouth daily with breakfast.  09/05/17  Yes Burns, Claudina Lick, MD  Multiple Vitamin (MULTIVITAMIN) capsule Take 1 capsule by mouth daily.    Yes [provider]  Naphazoline-Glycerin-Zinc Sulf (CLEAR EYES MAXIMUM ITCHY EYE OP) Place 1 drop into both eyes every 8 (eight) hours as needed (itchy eyes).   Yes [provider]  OXcarbazepine (TRILEPTAL) 150 MG tablet Take 1 tablet (150 mg total) by mouth 2 (two) times daily. 03/27/18  Yes Patel, Donika K, DO  polyethylene glycol (MIRALAX / GLYCOLAX) packet Take 17 g by mouth daily. Patient taking differently: Take 17 g by mouth as needed for moderate constipation.  01/12/17  Yes Susa Day, MD  potassium chloride SA (KLOR-CON M20) 20 MEQ tablet Take 2 tablets (40 mEq total) by mouth daily. Patient taking differently: Take 20 mEq by mouth 2 (two) times daily.   09/12/17  Yes Burns, Claudina Lick, MD  Probiotic Product (PROBIOTIC PO) Take 1 capsule by mouth daily after breakfast.    Yes [provider]  ranitidine (ZANTAC) 150 MG tablet Take 1 tablet (150 mg total) by mouth 2 (two) times daily. 09/14/15  Yes Burns, Claudina Lick, MD  ACCU-CHEK AVIVA PLUS test strip USE TO CHECK BLOOD SUGARS  TWO TIMES DAILY 12/26/17   Binnie Rail, MD  Blood Glucose Monitoring Suppl (ACCU-CHEK AVIVA PLUS) W/DEVICE KIT 1 kit by Does not apply route once. 11/13/12   Rowe Clack, MD  docusate sodium (COLACE) 100 MG capsule Take 1 capsule (100 mg total) by mouth 2 (two) times daily as needed for mild constipation. Patient not taking: Reported on 05/18/2018 01/12/17   Susa Day, MD  metoprolol tartrate (LOPRESSOR) 50 MG tablet Take one tablet by mouth 2  hours prior to your CT Patient not taking: Reported on 05/18/2018 04/17/18   Belva Crome, MD  sucralfate (CARAFATE) 1 g tablet Take 1 tablet (1 g total) by mouth 4 (four) times daily -  with meals and at bedtime for 7 days. Patient not taking: Reported on 05/23/2018 04/14/18 04/21/18  Jean Rosenthal, MD  traMADol (ULTRAM) 50 MG tablet TAKE 1 TABLET EVERY 12 HOURS AS NEEDED Patient not taking: Reported on 05/18/2018 03/21/17   Binnie Rail, MD    Family History Family History  Problem Relation Age of Onset  . Arthritis Mother   . Heart disease Father   . Arthritis Other        Grandmother  . Diabetes Other        Grandmother    Social History Social History   Tobacco Use  . Smoking status: Former Smoker    Last attempt to quit: 10/19/1985    Years since quitting: 32.6  . Smokeless tobacco: Never Used  Substance Use Topics  . Alcohol use: Yes    Alcohol/week: 0.0 standard drinks    Comment: occasionally   . Drug use: No     Allergies   Cymbalta [duloxetine hcl]; Effexor [venlafaxine]; Other; Statins; and Penicillins   Review of Systems Review of Systems  Constitutional: Negative for chills,  diaphoresis and fever.  Respiratory: Positive for shortness of breath. Negative for cough.   Cardiovascular: Positive for chest pain. Negative for leg swelling.  Gastrointestinal: Positive for nausea. Negative for abdominal pain, diarrhea and vomiting.  Neurological: Negative for dizziness, syncope and light-headedness.  All other systems reviewed and are negative.    Physical Exam Updated Vital Signs BP 131/74 (BP Location: Right Arm)   Pulse 72   Temp 98.4 F (36.9 C) (Oral)   Resp 18   Ht '5\' 2"'  (1.575 m)   Wt 83.5 kg   SpO2 97%   BMI 33.65 kg/m   Physical Exam  Constitutional: She appears well-developed and well-nourished. No distress.  HENT:  Head: Normocephalic and atraumatic.  Eyes: Conjunctivae are normal.  Neck: Neck supple.  Cardiovascular: Normal rate, regular rhythm, normal heart sounds and intact distal pulses.  Pulmonary/Chest: Effort normal and breath sounds normal. No respiratory distress.  Abdominal: Soft. There is no tenderness. There is no guarding.  Musculoskeletal: She exhibits no edema.  Lymphadenopathy:    She has no cervical adenopathy.  Neurological: She is alert.  Skin: Skin is warm and dry. She is not diaphoretic.  Psychiatric: She has a normal mood and affect. Her behavior is normal.  Nursing note and vitals reviewed.    ED Treatments / Results  Labs (all labs ordered are listed, but only abnormal results are displayed) Labs Reviewed  COMPREHENSIVE METABOLIC PANEL - Abnormal; Notable for the following components:      Result Value   Potassium 3.1 (*)    Chloride 94 (*)    Glucose, Bld 169 (*)    All other components within normal limits  MRSA PCR SCREENING  TROPONIN I  CBC WITH DIFFERENTIAL/PLATELET  CBC  CREATININE, SERUM  TROPONIN I  TROPONIN I  TROPONIN I  BASIC METABOLIC PANEL  CBC  I-STAT TROPONIN, ED    EKG   ED ECG REPORT   Date: 05/27/2018  Rate: 66  Rhythm: Sinus rhythm  QRS Axis: normal  Intervals: normal   ST/T Wave abnormalities: nonspecific T wave changes  Conduction Disutrbances:none  Narrative Interpretation:   Old EKG Reviewed: unchanged  I have  personally reviewed the EKG tracing and agree with the computerized printout as noted.   Radiology Dg Chest Portable 1 View  Result Date: 05/26/2018 CLINICAL DATA:  Chest pain EXAM: PORTABLE CHEST 1 VIEW COMPARISON:  04/14/2018 FINDINGS: The heart size and mediastinal contours are within normal limits. Both lungs are clear. The visualized skeletal structures are unremarkable. IMPRESSION: No active disease. Electronically Signed   By: Franchot Gallo M.D.   On: 05/26/2018 13:40    Procedures Procedures (including critical care time)  Medications Ordered in ED Medications  nitroGLYCERIN (NITROSTAT) SL tablet 0.4 mg (0.4 mg Sublingual Given 05/26/18 1354)  aspirin EC tablet 81 mg (81 mg Oral Not Given 05/26/18 1746)  ezetimibe (ZETIA) tablet 10 mg (10 mg Oral Given 05/26/18 2124)  glipiZIDE (GLUCOTROL XL) 24 hr tablet 5 mg (has no administration in time range)  famotidine (PEPCID) tablet 20 mg (has no administration in time range)  clonazePAM (KLONOPIN) tablet 0.25 mg (has no administration in time range)  OXcarbazepine (TRILEPTAL) tablet 150 mg (150 mg Oral Given 05/26/18 2124)  multivitamin with minerals tablet 1 tablet (has no administration in time range)  azelastine (ASTELIN) 0.1 % nasal spray 2 spray (has no administration in time range)  acetaminophen (TYLENOL) tablet 650 mg (has no administration in time range)  ondansetron (ZOFRAN) injection 4 mg (has no administration in time range)  sodium chloride flush (NS) 0.9 % injection 3 mL (3 mLs Intravenous Given 05/26/18 2129)  sodium chloride flush (NS) 0.9 % injection 3 mL (has no administration in time range)  0.9 %  sodium chloride infusion (has no administration in time range)  aspirin chewable tablet 81 mg (has no administration in time range)  enoxaparin (LOVENOX) injection 40 mg  (40 mg Subcutaneous Given 05/26/18 2123)  insulin aspart (novoLOG) injection 0-15 Units (has no administration in time range)  0.9% sodium chloride infusion (has no administration in time range)    Followed by  0.9% sodium chloride infusion (has no administration in time range)  albuterol (PROVENTIL) (2.5 MG/3ML) 0.083% nebulizer solution 2.5 mg (has no administration in time range)  Influenza vac split quadrivalent PF (FLUZONE HIGH-DOSE) injection 0.5 mL (has no administration in time range)  gabapentin (NEURONTIN) capsule 800 mg (800 mg Oral Given 05/26/18 2122)  aspirin chewable tablet 324 mg (324 mg Oral Given 05/26/18 1328)  ondansetron (ZOFRAN) injection 4 mg (4 mg Intravenous Given 05/26/18 1350)  morphine 4 MG/ML injection 4 mg (4 mg Intravenous Given 05/26/18 1407)  ketorolac (TORADOL) 15 MG/ML injection 15 mg (15 mg Intravenous Given 05/26/18 1527)     Initial Impression / Assessment and Plan / ED Course  I have reviewed the triage vital signs and the nursing notes.  Pertinent labs & imaging results that were available during my care of the patient were reviewed by me and considered in my medical decision making (see chart for details).  Clinical Course as of May 27 548  Sat May 26, 2018  1348 Patient states her pain is now 9/10.   [SJ]  0488 Spoke with Dr. Domenic Polite, cardiology. States they will come assess the patient.   [SJ]  1458 Pain is now rated 8/10.   [SJ]  70 Spoke with Dr. Curt Bears, cardiologist. States they will admit the patient.    [SJ]    Clinical Course User Index [SJ] ,  C, PA-C    Patient presents with chest pain.  She is scheduled for cardiac cath on December 4.  Patient had some relief with nitroglycerin.  Concern for unstable angina. Admitted via cardiology team.  Findings and plan of care discussed with Davonna Belling, MD.    Final Clinical Impressions(s) / ED Diagnoses   Final diagnoses:  Chest pain, unspecified type    ED  Discharge Orders    None       Layla Maw 05/27/18 0550    Davonna Belling, MD 05/27/18 1528

## 2018-05-27 DIAGNOSIS — G40909 Epilepsy, unspecified, not intractable, without status epilepticus: Secondary | ICD-10-CM | POA: Diagnosis not present

## 2018-05-27 DIAGNOSIS — E785 Hyperlipidemia, unspecified: Secondary | ICD-10-CM | POA: Diagnosis not present

## 2018-05-27 DIAGNOSIS — R079 Chest pain, unspecified: Secondary | ICD-10-CM

## 2018-05-27 DIAGNOSIS — Z23 Encounter for immunization: Secondary | ICD-10-CM | POA: Diagnosis not present

## 2018-05-27 DIAGNOSIS — Z833 Family history of diabetes mellitus: Secondary | ICD-10-CM | POA: Diagnosis not present

## 2018-05-27 DIAGNOSIS — Z87891 Personal history of nicotine dependence: Secondary | ICD-10-CM | POA: Diagnosis not present

## 2018-05-27 DIAGNOSIS — Z96652 Presence of left artificial knee joint: Secondary | ICD-10-CM | POA: Diagnosis not present

## 2018-05-27 DIAGNOSIS — R931 Abnormal findings on diagnostic imaging of heart and coronary circulation: Secondary | ICD-10-CM | POA: Diagnosis not present

## 2018-05-27 DIAGNOSIS — Z7984 Long term (current) use of oral hypoglycemic drugs: Secondary | ICD-10-CM | POA: Diagnosis not present

## 2018-05-27 DIAGNOSIS — E782 Mixed hyperlipidemia: Secondary | ICD-10-CM

## 2018-05-27 DIAGNOSIS — Z7982 Long term (current) use of aspirin: Secondary | ICD-10-CM | POA: Diagnosis not present

## 2018-05-27 DIAGNOSIS — I25119 Atherosclerotic heart disease of native coronary artery with unspecified angina pectoris: Secondary | ICD-10-CM | POA: Diagnosis not present

## 2018-05-27 DIAGNOSIS — Z8249 Family history of ischemic heart disease and other diseases of the circulatory system: Secondary | ICD-10-CM | POA: Diagnosis not present

## 2018-05-27 DIAGNOSIS — E1140 Type 2 diabetes mellitus with diabetic neuropathy, unspecified: Secondary | ICD-10-CM | POA: Diagnosis not present

## 2018-05-27 DIAGNOSIS — Z90710 Acquired absence of both cervix and uterus: Secondary | ICD-10-CM | POA: Diagnosis not present

## 2018-05-27 DIAGNOSIS — J449 Chronic obstructive pulmonary disease, unspecified: Secondary | ICD-10-CM | POA: Diagnosis not present

## 2018-05-27 DIAGNOSIS — I10 Essential (primary) hypertension: Secondary | ICD-10-CM | POA: Diagnosis not present

## 2018-05-27 DIAGNOSIS — Z79899 Other long term (current) drug therapy: Secondary | ICD-10-CM | POA: Diagnosis not present

## 2018-05-27 LAB — CBC
HCT: 38.9 % (ref 36.0–46.0)
Hemoglobin: 12.6 g/dL (ref 12.0–15.0)
MCH: 29.9 pg (ref 26.0–34.0)
MCHC: 32.4 g/dL (ref 30.0–36.0)
MCV: 92.4 fL (ref 80.0–100.0)
Platelets: 276 10*3/uL (ref 150–400)
RBC: 4.21 MIL/uL (ref 3.87–5.11)
RDW: 13.2 % (ref 11.5–15.5)
WBC: 5.6 10*3/uL (ref 4.0–10.5)
nRBC: 0 % (ref 0.0–0.2)

## 2018-05-27 LAB — BASIC METABOLIC PANEL
Anion gap: 12 (ref 5–15)
BUN: 14 mg/dL (ref 8–23)
CO2: 26 mmol/L (ref 22–32)
Calcium: 9.7 mg/dL (ref 8.9–10.3)
Chloride: 95 mmol/L — ABNORMAL LOW (ref 98–111)
Creatinine, Ser: 0.87 mg/dL (ref 0.44–1.00)
GFR calc Af Amer: >60 mL/min (ref >60)
GFR calc non Af Amer: >60 mL/min (ref >60)
Glucose, Bld: 153 mg/dL — ABNORMAL HIGH (ref 70–99)
Potassium: 3.5 mmol/L (ref 3.5–5.1)
SODIUM: 133 mmol/L — AB (ref 135–145)

## 2018-05-27 LAB — TROPONIN I
Troponin I: <0.03 ng/mL (ref <0.03)
Troponin I: <0.03 ng/mL (ref <0.03)

## 2018-05-27 LAB — GLUCOSE, CAPILLARY
Glucose-Capillary: 194 mg/dL — ABNORMAL HIGH (ref 70–99)
Glucose-Capillary: 147 mg/dL — ABNORMAL HIGH (ref 70–99)
Glucose-Capillary: 132 mg/dL — ABNORMAL HIGH (ref 70–99)
Glucose-Capillary: 111 mg/dL — ABNORMAL HIGH (ref 70–99)

## 2018-05-27 MED ORDER — SODIUM CHLORIDE 0.9 % WEIGHT BASED INFUSION
3.0000 mL/kg/h | INTRAVENOUS | Status: DC
  Administered 2018-05-28: 3 mL/kg/h via INTRAVENOUS

## 2018-05-27 MED ORDER — SODIUM CHLORIDE 0.9% FLUSH
3.0000 mL | Freq: Two times a day (BID) | INTRAVENOUS | Status: DC
  Administered 2018-05-27: 3 mL via INTRAVENOUS

## 2018-05-27 MED ORDER — SODIUM CHLORIDE 0.9 % WEIGHT BASED INFUSION: 1.0000 mL/kg/h | INTRAVENOUS | Status: DC

## 2018-05-27 MED ORDER — SODIUM CHLORIDE 0.9 % IV SOLN: 250.0000 mL | INTRAVENOUS | Status: DC | PRN

## 2018-05-27 MED ORDER — KETOROLAC TROMETHAMINE 15 MG/ML IJ SOLN
15.0000 mg | Freq: Three times a day (TID) | INTRAMUSCULAR | Status: AC | PRN
  Administered 2018-05-27: 15 mg via INTRAVENOUS
  Filled 2018-05-27 (×2): qty 1

## 2018-05-27 MED ORDER — SODIUM CHLORIDE 0.9% FLUSH: 3.0000 mL | INTRAVENOUS | Status: DC | PRN

## 2018-05-27 MED ORDER — ASPIRIN 81 MG PO CHEW
81.0000 mg | CHEWABLE_TABLET | ORAL | Status: AC
  Administered 2018-05-28: 81 mg via ORAL

## 2018-05-27 NOTE — Progress Notes (Addendum)
Progress Note  Patient Name: [name] Date of Encounter: 05/27/2018  Primary Cardiologist: Sinclair Grooms, MD   Subjective   SOB and chest pain while cleaning herself this am.   Inpatient Medications    Scheduled Meds: . aspirin  81 mg Oral Pre-Cath  . aspirin EC  81 mg Oral Daily  . enoxaparin (LOVENOX) injection  40 mg Subcutaneous Q24H  . ezetimibe  10 mg Oral Daily  . famotidine  20 mg Oral Daily  . gabapentin  800 mg Oral TID  . glipiZIDE  5 mg Oral Q breakfast  . Influenza vac split quadrivalent PF  0.5 mL Intramuscular Tomorrow-1000  . insulin aspart  0-15 Units Subcutaneous TID WC  . multivitamin with minerals  1 tablet Oral Daily  . OXcarbazepine  150 mg Oral BID  . sodium chloride flush  3 mL Intravenous Q12H   Continuous Infusions: . sodium chloride    . [START ON 05/28/2018] sodium chloride     PRN Meds: sodium chloride, acetaminophen, albuterol, azelastine, clonazePAM, nitroGLYCERIN, ondansetron (ZOFRAN) IV, sodium chloride flush   Vital Signs    Vitals:   05/26/18 1815 05/26/18 1845 05/26/18 2344 05/27/18 0758  BP: 130/72 (!) 157/90 139/70 121/70  Pulse: 61 66 (!) 58 (!) 58  Resp: 11 13 12 15   Temp:  98.8 F (37.1 C) 98.6 F (37 C) 98 F (36.7 C)  TempSrc:  Oral Oral Oral  SpO2: 100% 100% 95% 98%  Weight:  84.3 kg    Height:  5\' 2"  (1.575 m)      Intake/Output Summary (Last 24 hours) at 05/27/2018 1111 Last data filed at 05/27/2018 0900 Gross per 24 hour  Intake 243 ml  Output 0 ml  Net 243 ml   Filed Weights   05/26/18 1303 05/26/18 1845  Weight: 83.5 kg 84.3 kg    Telemetry    SR - Personally Reviewed  ECG    SR, nonspecific ST T wave abnormalities, unchanged from prior - Personally Reviewed  Physical Exam   GEN: No acute distress.   Neck: No JVD Cardiac: RRR, no murmurs, rubs, or gallops.  Respiratory: Clear to auscultation bilaterally. GI: Soft, nontender, non-distended  MS: No edema; No deformity. Neuro:   Nonfocal  Psych: Normal affect   Labs    Chemistry Recent Labs  Lab 05/26/18 1319 05/26/18 1858 05/27/18 0620  NA 135  --  133*  K 3.1*  --  3.5  CL 94*  --  95*  CO2 26  --  26  GLUCOSE 169*  --  153*  BUN 10  --  14  CREATININE 0.79 0.89 0.87  CALCIUM 10.1  --  9.7  PROT 7.6  --   --   ALBUMIN 4.0  --   --   AST 34  --   --   ALT 26  --   --   ALKPHOS 54  --   --   BILITOT 0.6  --   --   GFRNONAA >60 >60 >60  GFRAA >60 >60 >60  ANIONGAP 15  --  12     Hematology Recent Labs  Lab 05/26/18 1319 05/26/18 1858 05/27/18 0620  WBC 6.0 8.3 5.6  RBC 4.36 4.42 4.21  HGB 13.0 13.2 12.6  HCT 39.9 40.7 38.9  MCV 91.5 92.1 92.4  MCH 29.8 29.9 29.9  MCHC 32.6 32.4 32.4  RDW 12.8 13.0 13.2  PLT 278 272 276    Cardiac Enzymes Recent  Labs  Lab 05/26/18 1319 05/26/18 1858 05/27/18 0046 05/27/18 0620  TROPONINI <0.03 <0.03 <0.03 <0.03    Recent Labs  Lab 05/26/18 1329  TROPIPOC 0.00     BNPNo results for input(s): BNP, PROBNP in the last 168 hours.   DDimer No results for input(s): DDIMER in the last 168 hours.   Radiology    Dg Chest Portable 1 View  Result Date: 05/26/2018 CLINICAL DATA:  Chest pain EXAM: PORTABLE CHEST 1 VIEW COMPARISON:  04/14/2018 FINDINGS: The heart size and mediastinal contours are within normal limits. Both lungs are clear. The visualized skeletal structures are unremarkable. IMPRESSION: No active disease. Electronically Signed   By: Franchot Gallo M.D.   On: 05/26/2018 13:40    Cardiac Studies   Coronary CTA: 05/18/18  IMPRESSION: 1. Coronary calcium score of 777. This was 95% percentile for age and sex matched control.  2. Normal coronary origin with right dominance.  3. Diffuse three vessel disease with suspicion for > 70% stenosis in the proximal LAD and proximal portion of the 1. diagonal artery. Cardiac catheterization is recommended.  CT FFR: IMPRESSION: 1. CT FFR analysis showed significant stenosis in the 1.  diagonal artery but not in proximal LAD.  Patient Profile     77 y.o. female   [name]    1. Chest pain: negative troponin x 3, abnormal coronary CTA on 11/22 - significant D1 stenosis - we will plan for a cardiac cath tomorrow  Discussed the cath with the patient. The patient understands that risks included but are not limited to stroke (1 in 1000), death (1 in 61), kidney failure [usually temporary] (1 in 500), bleeding (1 in 200), allergic reaction [possibly serious] (1 in 200). The patient understands and agrees to proceed.  - she has allergy to statins - nausea and vomiting, we will refer to our lipid clinic for PCSK 9 inhibitors.  2. Hypertension: Well-controlled today.  Continue with home medications. 3. Type 2 diabetes: We will hold metformin but continue other diabetic meds.  Will start sliding scale insulin.  For questions or updates, please contact [place] Please consult [email or web address] for contact info under     Signed, Ena Dawley, MD  05/27/2018, 11:11 AM

## 2018-05-27 NOTE — H&P (View-Only) (Signed)
Progress Note  Patient Name: [name] Date of Encounter: 05/27/2018  Primary Cardiologist: Sinclair Grooms, MD   Subjective   SOB and chest pain while cleaning herself this am.   Inpatient Medications    Scheduled Meds: . aspirin  81 mg Oral Pre-Cath  . aspirin EC  81 mg Oral Daily  . enoxaparin (LOVENOX) injection  40 mg Subcutaneous Q24H  . ezetimibe  10 mg Oral Daily  . famotidine  20 mg Oral Daily  . gabapentin  800 mg Oral TID  . glipiZIDE  5 mg Oral Q breakfast  . Influenza vac split quadrivalent PF  0.5 mL Intramuscular Tomorrow-1000  . insulin aspart  0-15 Units Subcutaneous TID WC  . multivitamin with minerals  1 tablet Oral Daily  . OXcarbazepine  150 mg Oral BID  . sodium chloride flush  3 mL Intravenous Q12H   Continuous Infusions: . sodium chloride    . [START ON 05/28/2018] sodium chloride     PRN Meds: sodium chloride, acetaminophen, albuterol, azelastine, clonazePAM, nitroGLYCERIN, ondansetron (ZOFRAN) IV, sodium chloride flush   Vital Signs    Vitals:   05/26/18 1815 05/26/18 1845 05/26/18 2344 05/27/18 0758  BP: 130/72 (!) 157/90 139/70 121/70  Pulse: 61 66 (!) 58 (!) 58  Resp: 11 13 12 15   Temp:  98.8 F (37.1 C) 98.6 F (37 C) 98 F (36.7 C)  TempSrc:  Oral Oral Oral  SpO2: 100% 100% 95% 98%  Weight:  84.3 kg    Height:  5\' 2"  (1.575 m)      Intake/Output Summary (Last 24 hours) at 05/27/2018 1111 Last data filed at 05/27/2018 0900 Gross per 24 hour  Intake 243 ml  Output 0 ml  Net 243 ml   Filed Weights   05/26/18 1303 05/26/18 1845  Weight: 83.5 kg 84.3 kg    Telemetry    SR - Personally Reviewed  ECG    SR, nonspecific ST T wave abnormalities, unchanged from prior - Personally Reviewed  Physical Exam   GEN: No acute distress.   Neck: No JVD Cardiac: RRR, no murmurs, rubs, or gallops.  Respiratory: Clear to auscultation bilaterally. GI: Soft, nontender, non-distended  MS: No edema; No deformity. Neuro:   Nonfocal  Psych: Normal affect   Labs    Chemistry Recent Labs  Lab 05/26/18 1319 05/26/18 1858 05/27/18 0620  NA 135  --  133*  K 3.1*  --  3.5  CL 94*  --  95*  CO2 26  --  26  GLUCOSE 169*  --  153*  BUN 10  --  14  CREATININE 0.79 0.89 0.87  CALCIUM 10.1  --  9.7  PROT 7.6  --   --   ALBUMIN 4.0  --   --   AST 34  --   --   ALT 26  --   --   ALKPHOS 54  --   --   BILITOT 0.6  --   --   GFRNONAA >60 >60 >60  GFRAA >60 >60 >60  ANIONGAP 15  --  12     Hematology Recent Labs  Lab 05/26/18 1319 05/26/18 1858 05/27/18 0620  WBC 6.0 8.3 5.6  RBC 4.36 4.42 4.21  HGB 13.0 13.2 12.6  HCT 39.9 40.7 38.9  MCV 91.5 92.1 92.4  MCH 29.8 29.9 29.9  MCHC 32.6 32.4 32.4  RDW 12.8 13.0 13.2  PLT 278 272 276    Cardiac Enzymes Recent  Labs  Lab 05/26/18 1319 05/26/18 1858 05/27/18 0046 05/27/18 0620  TROPONINI <0.03 <0.03 <0.03 <0.03    Recent Labs  Lab 05/26/18 1329  TROPIPOC 0.00     BNPNo results for input(s): BNP, PROBNP in the last 168 hours.   DDimer No results for input(s): DDIMER in the last 168 hours.   Radiology    Dg Chest Portable 1 View  Result Date: 05/26/2018 CLINICAL DATA:  Chest pain EXAM: PORTABLE CHEST 1 VIEW COMPARISON:  04/14/2018 FINDINGS: The heart size and mediastinal contours are within normal limits. Both lungs are clear. The visualized skeletal structures are unremarkable. IMPRESSION: No active disease. Electronically Signed   By: Franchot Gallo M.D.   On: 05/26/2018 13:40    Cardiac Studies   Coronary CTA: 05/18/18  IMPRESSION: 1. Coronary calcium score of 777. This was 95% percentile for age and sex matched control.  2. Normal coronary origin with right dominance.  3. Diffuse three vessel disease with suspicion for > 70% stenosis in the proximal LAD and proximal portion of the 1. diagonal artery. Cardiac catheterization is recommended.  CT FFR: IMPRESSION: 1. CT FFR analysis showed significant stenosis in the 1.  diagonal artery but not in proximal LAD.  Patient Profile     77 y.o. female   [place]    1. Chest pain: negative troponin x 3, abnormal coronary CTA on 11/22 - significant D1 stenosis - we will plan for a cardiac cath tomorrow  Discussed the cath with the patient. The patient understands that risks included but are not limited to stroke (1 in 1000), death (1 in 67), kidney failure [usually temporary] (1 in 500), bleeding (1 in 200), allergic reaction [possibly serious] (1 in 200). The patient understands and agrees to proceed.  - she has allergy to statins - nausea and vomiting, we will refer to our lipid clinic for PCSK 9 inhibitors.  2. Hypertension: Well-controlled today.  Continue with home medications. 3. Type 2 diabetes: We will hold metformin but continue other diabetic meds.  Will start sliding scale insulin.  For questions or updates, please contact [place] Please consult [email or web address] for contact info under     Signed, Ena Dawley, MD  05/27/2018, 11:11 AM

## 2018-05-28 ENCOUNTER — Ambulatory Visit: Payer: Medicare Other | Admitting: Interventional Cardiology

## 2018-05-28 ENCOUNTER — Encounter (HOSPITAL_COMMUNITY): Payer: Self-pay | Admitting: Interventional Cardiology

## 2018-05-28 ENCOUNTER — Encounter (HOSPITAL_COMMUNITY)
Admission: EM | Disposition: A | Discharge status: Home / Self Care | Payer: Self-pay | Source: Home / Self Care | Attending: Interventional Cardiology

## 2018-05-28 DIAGNOSIS — I2510 Atherosclerotic heart disease of native coronary artery without angina pectoris: Secondary | ICD-10-CM

## 2018-05-28 DIAGNOSIS — I10 Essential (primary) hypertension: Secondary | ICD-10-CM | POA: Diagnosis not present

## 2018-05-28 DIAGNOSIS — R079 Chest pain, unspecified: Secondary | ICD-10-CM | POA: Diagnosis not present

## 2018-05-28 DIAGNOSIS — R931 Abnormal findings on diagnostic imaging of heart and coronary circulation: Secondary | ICD-10-CM | POA: Diagnosis not present

## 2018-05-28 DIAGNOSIS — I25119 Atherosclerotic heart disease of native coronary artery with unspecified angina pectoris: Secondary | ICD-10-CM | POA: Diagnosis not present

## 2018-05-28 DIAGNOSIS — R0789 Other chest pain: Secondary | ICD-10-CM | POA: Diagnosis not present

## 2018-05-28 DIAGNOSIS — E785 Hyperlipidemia, unspecified: Secondary | ICD-10-CM | POA: Diagnosis not present

## 2018-05-28 DIAGNOSIS — Z23 Encounter for immunization: Secondary | ICD-10-CM | POA: Diagnosis not present

## 2018-05-28 HISTORY — PX: LEFT HEART CATH AND CORONARY ANGIOGRAPHY: CATH118249

## 2018-05-28 HISTORY — DX: Atherosclerotic heart disease of native coronary artery without angina pectoris: I25.10

## 2018-05-28 LAB — GLUCOSE, CAPILLARY
GLUCOSE-CAPILLARY: 135 mg/dL — AB (ref 70–99)
Glucose-Capillary: 146 mg/dL — ABNORMAL HIGH (ref 70–99)
Glucose-Capillary: 126 mg/dL — ABNORMAL HIGH (ref 70–99)

## 2018-05-28 SURGERY — LEFT HEART CATH AND CORONARY ANGIOGRAPHY
Anesthesia: LOCAL

## 2018-05-28 MED ORDER — ONDANSETRON HCL 4 MG/2ML IJ SOLN: 4.0000 mg | Freq: Four times a day (QID) | INTRAMUSCULAR | Status: DC | PRN

## 2018-05-28 MED ORDER — HEPARIN (PORCINE) IN NACL 1000-0.9 UT/500ML-% IV SOLN
INTRAVENOUS | Status: AC
  Filled 2018-05-28: qty 1000

## 2018-05-28 MED ORDER — IOHEXOL 350 MG/ML SOLN
INTRAVENOUS | Status: DC | PRN
  Administered 2018-05-28: 80 mL via INTRA_ARTERIAL

## 2018-05-28 MED ORDER — HEPARIN SODIUM (PORCINE) 1000 UNIT/ML IJ SOLN
INTRAMUSCULAR | Status: DC | PRN
  Administered 2018-05-28: 4000 [IU] via INTRAVENOUS

## 2018-05-28 MED ORDER — OXYCODONE HCL 5 MG PO TABS: 5.0000 mg | ORAL_TABLET | ORAL | Status: DC | PRN

## 2018-05-28 MED ORDER — SODIUM CHLORIDE 0.9 % IV SOLN
INTRAVENOUS | Status: AC
  Administered 2018-05-28: 11:00:00 via INTRAVENOUS

## 2018-05-28 MED ORDER — VERAPAMIL HCL 2.5 MG/ML IV SOLN
INTRAVENOUS | Status: AC
  Filled 2018-05-28: qty 2

## 2018-05-28 MED ORDER — ASPIRIN 81 MG PO CHEW: 81.0000 mg | CHEWABLE_TABLET | Freq: Every day | ORAL | Status: DC

## 2018-05-28 MED ORDER — ROSUVASTATIN CALCIUM 5 MG PO TABS: 5.0000 mg | ORAL_TABLET | Freq: Every day | ORAL | Status: DC

## 2018-05-28 MED ORDER — INFLUENZA VAC SPLIT HIGH-DOSE 0.5 ML IM SUSY
0.5000 mL | PREFILLED_SYRINGE | Freq: Once | INTRAMUSCULAR | Status: AC
  Administered 2018-05-28: 0.5 mL via INTRAMUSCULAR
  Filled 2018-05-28: qty 0.5

## 2018-05-28 MED ORDER — METFORMIN HCL ER 500 MG PO TB24: 500.0000 mg | ORAL_TABLET | Freq: Every day | ORAL | 3 refills | Status: DC

## 2018-05-28 MED ORDER — SODIUM CHLORIDE 0.9% FLUSH: 3.0000 mL | INTRAVENOUS | Status: DC | PRN

## 2018-05-28 MED ORDER — HEPARIN SODIUM (PORCINE) 1000 UNIT/ML IJ SOLN
INTRAMUSCULAR | Status: AC
  Filled 2018-05-28: qty 1

## 2018-05-28 MED ORDER — ROSUVASTATIN CALCIUM 5 MG PO TABS: 5.0000 mg | ORAL_TABLET | Freq: Every day | ORAL | 5 refills | Status: DC

## 2018-05-28 MED ORDER — SENNOSIDES-DOCUSATE SODIUM 8.6-50 MG PO TABS: 2.0000 | ORAL_TABLET | Freq: Two times a day (BID) | ORAL | Status: DC

## 2018-05-28 MED ORDER — VERAPAMIL HCL 2.5 MG/ML IV SOLN
INTRAVENOUS | Status: DC | PRN
  Administered 2018-05-28: 10 mL via INTRA_ARTERIAL

## 2018-05-28 MED ORDER — HEPARIN (PORCINE) IN NACL 1000-0.9 UT/500ML-% IV SOLN
INTRAVENOUS | Status: DC | PRN
  Administered 2018-05-28 (×2): 500 mL

## 2018-05-28 MED ORDER — ACETAMINOPHEN 325 MG PO TABS: 650.0000 mg | ORAL_TABLET | ORAL | Status: DC | PRN

## 2018-05-28 MED ORDER — NITROGLYCERIN 0.4 MG SL SUBL: 0.4000 mg | SUBLINGUAL_TABLET | SUBLINGUAL | 2 refills | Status: DC | PRN

## 2018-05-28 MED ORDER — LIDOCAINE HCL (PF) 1 % IJ SOLN
INTRAMUSCULAR | Status: DC | PRN
  Administered 2018-05-28: 2 mL

## 2018-05-28 MED ORDER — FENTANYL CITRATE (PF) 100 MCG/2ML IJ SOLN
INTRAMUSCULAR | Status: DC | PRN
  Administered 2018-05-28: 25 ug via INTRAVENOUS

## 2018-05-28 MED ORDER — FENTANYL CITRATE (PF) 100 MCG/2ML IJ SOLN
INTRAMUSCULAR | Status: AC
  Filled 2018-05-28: qty 2

## 2018-05-28 MED ORDER — SENNOSIDES-DOCUSATE SODIUM 8.6-50 MG PO TABS: 2.0000 | ORAL_TABLET | Freq: Two times a day (BID) | ORAL | 0 refills | Status: DC

## 2018-05-28 MED ORDER — SODIUM CHLORIDE 0.9% FLUSH: 3.0000 mL | Freq: Two times a day (BID) | INTRAVENOUS | Status: DC

## 2018-05-28 MED ORDER — MIDAZOLAM HCL 2 MG/2ML IJ SOLN
INTRAMUSCULAR | Status: AC
  Filled 2018-05-28: qty 2

## 2018-05-28 MED ORDER — LIDOCAINE HCL (PF) 1 % IJ SOLN
INTRAMUSCULAR | Status: AC
  Filled 2018-05-28: qty 30

## 2018-05-28 MED ORDER — MIDAZOLAM HCL 2 MG/2ML IJ SOLN
INTRAMUSCULAR | Status: DC | PRN
  Administered 2018-05-28: 1 mg via INTRAVENOUS

## 2018-05-28 MED ORDER — SODIUM CHLORIDE 0.9 % IV SOLN
250.0000 mL | INTRAVENOUS | Status: DC | PRN
  Administered 2018-05-28: 250 mL via INTRAVENOUS

## 2018-05-28 SURGICAL SUPPLY — 11 items
CATH INFINITI 5 FR JL3.5 (CATHETERS) ×1 IMPLANT
CATH INFINITI JR4 5F (CATHETERS) ×1 IMPLANT
DEVICE RAD COMP TR BAND LRG (VASCULAR PRODUCTS) ×1 IMPLANT
GLIDESHEATH SLEND A-KIT 6F 22G (SHEATH) ×1 IMPLANT
GUIDEWIRE INQWIRE 1.5J.035X260 (WIRE) IMPLANT
INQWIRE 1.5J .035X260CM (WIRE) ×2
KIT HEART LEFT (KITS) ×2 IMPLANT
PACK CARDIAC CATHETERIZATION (CUSTOM PROCEDURE TRAY) ×2 IMPLANT
SHEATH PROBE COVER 6X72 (BAG) ×1 IMPLANT
TRANSDUCER W/STOPCOCK (MISCELLANEOUS) ×2 IMPLANT
TUBING CIL FLEX 10 FLL-RA (TUBING) ×2 IMPLANT

## 2018-05-28 NOTE — Discharge Summary (Addendum)
Discharge Summary    Patient ID: [name] MRN: [id number]; DOB: 1941/02/24  Admit date: 05/26/2018 Discharge date: 05/28/2018  Primary Care Provider: Binnie Rail, MD  Primary Cardiologist: Sinclair Grooms, MD  Primary Electrophysiologist:  None   Discharge Diagnoses    Principal Problem:   Atypical chest pain Active Problems:   Abnormal CT scan, heart   CAD (coronary artery disease)   Allergies Allergies  Allergen Reactions  . Cymbalta [Duloxetine Hcl] Hives and Itching  . Effexor [Venlafaxine] Nausea Only  . Other     "SEEDED" food due to stomach issues  . Statins Nausea And Vomiting  . Penicillins Hives and Rash    Has patient had a PCN reaction causing immediate rash, facial/tongue/throat swelling, SOB or lightheadedness with hypotension: Yes Has patient had a PCN reaction causing severe rash involving mucus membranes or skin necrosis: No Has patient had a PCN reaction that required hospitalization: No Has patient had a PCN reaction occurring within the last 10 years: Yes If all of the above answers are "NO", then may proceed with Cephalosporin use.     Diagnostic Studies/Procedures    Outpatient Coronary CTA 05/18/18 1. Left Main:  No significant stenosis.  2. LAD: No significant stenosis. 3. D1: FFR <0.8. 4. LCX: No significant stenosis. 5. RCA: No significant stenosis.  IMPRESSION: 1. CT FFR analysis showed significant stenosis in the 1. diagonal artery but not in proximal LAD.  LHC 05/28/18 Procedures   LEFT HEART CATH AND CORONARY ANGIOGRAPHY  Conclusion    The first diagonal is a large vessel that immediately branches into 2 equally sized subbranches.  The more medial branch contains ostial 90% stenosis.  Left main is widely patent  LAD contains diffuse 30% mid vessel stenosis.  Ostial circumflex contains 50 to 60% eccentric narrowing.  RCA contains mid eccentric 30% narrowing.  RCA also has a diagonal with origin and anterior  takeoff.  Normal left ventricular systolic function with EF greater than 60% and normal filling pressures.  RECOMMENDATIONS:   Aspirin  Statin therapy  Diagonal branch is not a good interventional target due to ostial location/bifurcation.      History of Present Illness     [name] is a 77 year old female with a history significant for small vessel cerebrovascular disease, type 2 diabetes, tobacco abuse, hypertension, and COPD who presented to the hospital on 05/26/18 with chest pain.  She was scheduled to have a left heart catheterization on 05/30/2018.  She had a coronary CT that showed significant diagonal disease.  She presented to the emergency room with chest pain around 8:30 AM upon awakening.  She describes it as a left-sided heaviness radiating inferiorly into her left shoulder, 10 out of 10 associated with shortness of breath and nausea.  Initial troponin was negative.  Hospital Course     Pt was admitted to telemetry. Troponins were cycled and remained negative x 3. EKG showed no acute changes. LHC was performed by Dr. Tamala Julian on 05/28/18. Cath showed the first diagonal to be a large vessel that immediately branches into 2 equally sized subbranches.  The more medial branch contains ostial 90% stenosis. The left main is widely patent . The LAD contains diffuse 30% mid vessel stenosis. The ostial circumflex contains 50-60% eccentric narrowing. The RCA contains mid eccentric 30% narrowing. The RCA also has a diagonal with origin and anterior takeoff. LVEF normal at 60% with normal filling pressure. The diagonal branch was felt not to be a good interventional  target due to ostial location/ bifurcation. Medical therapy was recommended. She was started on ASA and statin therapy. She was monitored post cath and had no post cath complications. Dr. Gwenlyn Found felt that she was stable for discharge home. She will f/u with Dr. Tamala Julian as an outpatient.   Consultants: none  Discharge Vitals Blood  pressure 116/62, pulse (!) 55, temperature (!) 97.4 F (36.3 C), temperature source Oral, resp. rate 10, height '5\' 2"'  (1.575 m), weight 83.3 kg, SpO2 98 %.  Filed Weights   05/26/18 1845 05/27/18 1955 05/28/18 0443  Weight: 84.3 kg 83.3 kg 83.3 kg    Labs & Radiologic Studies    CBC Recent Labs    05/26/18 1319 05/26/18 1858 05/27/18 0620  WBC 6.0 8.3 5.6  NEUTROABS 2.6  --   --   HGB 13.0 13.2 12.6  HCT 39.9 40.7 38.9  MCV 91.5 92.1 92.4  PLT 278 272 956   Basic Metabolic Panel Recent Labs    05/26/18 1319 05/26/18 1858 05/27/18 0620  NA 135  --  133*  K 3.1*  --  3.5  CL 94*  --  95*  CO2 26  --  26  GLUCOSE 169*  --  153*  BUN 10  --  14  CREATININE 0.79 0.89 0.87  CALCIUM 10.1  --  9.7   Liver Function Tests Recent Labs    05/26/18 1319  AST 34  ALT 26  ALKPHOS 54  BILITOT 0.6  PROT 7.6  ALBUMIN 4.0   No results for input(s): LIPASE, AMYLASE in the last 72 hours. Cardiac Enzymes Recent Labs    05/26/18 1858 05/27/18 0046 05/27/18 0620  TROPONINI <0.03 <0.03 <0.03   BNP Invalid input(s): POCBNP D-Dimer No results for input(s): DDIMER in the last 72 hours. Hemoglobin A1C No results for input(s): HGBA1C in the last 72 hours. Fasting Lipid Panel No results for input(s): CHOL, HDL, LDLCALC, TRIG, CHOLHDL, LDLDIRECT in the last 72 hours. Thyroid Function Tests No results for input(s): TSH, T4TOTAL, T3FREE, THYROIDAB in the last 72 hours.  Invalid input(s): FREET3 _____________  Ct Coronary Morph W/cta Cor W/score W/ca W/cm &/or Wo/cm  Addendum Date: 05/22/2018   ADDENDUM REPORT: 05/22/2018 17:55 EXAM: CT FFR ANALYSIS FINDINGS: FFRct analysis was performed on the original cardiac CT angiogram dataset. Diagrammatic representation of the FFRct analysis is provided in a separate PDF document in PACS. This dictation was created using the PDF document and an interactive 3D model of the results. 3D model is not available in the EMR/PACS. Normal FFR  range is >0.80. 1. Left Main:  No significant stenosis. 2. LAD: No significant stenosis. 3. D1: FFR <0.8. 4. LCX: No significant stenosis. 5. RCA: No significant stenosis. IMPRESSION: 1. CT FFR analysis showed significant stenosis in the 1. diagonal artery but not in proximal LAD. Electronically Signed   By: Ena Dawley   On: 05/22/2018 17:55   Addendum Date: 05/22/2018   ADDENDUM REPORT: 05/22/2018 09:50 CLINICAL DATA:  77 year old female, former smoker with h/o DM, hypertension and chest pain. EXAM: Cardiac/Coronary  CT TECHNIQUE: The patient was scanned on a [employer]. FINDINGS: A 120 kV prospective scan was triggered in the descending thoracic aorta at 111 HU's. Axial non-contrast 3 mm slices were carried out through the heart. The data set was analyzed on a dedicated work station and scored using the [place]. Gantry rotation speed was 250 msecs and collimation was .6 mm. No beta blockade and 0.8 mg of sl NTG  was given. The 3D data set was reconstructed in 5% intervals of the 67-82 % of the R-R cycle. Diastolic phases were analyzed on a dedicated work station using MPR, MIP and VRT modes. The patient received 80 cc of contrast. Aorta: Normal size. Mild diffuse calcifications in the aortic arch and descending aorta. No dissection. Aortic Valve: Trileaflet. No calcifications, mild leaflet thickening. Coronary Arteries:  Normal coronary origin.  Right dominance. RCA is a large dominant artery that gives rise to PDA and PLA. There is moderate calcified ostial plaque with stenosis 50-69%. Proximal RCA has a long, moderate, mixed, predominantly calcified plaque with stenosis 50-69%. Mid RCA has a moderate focal non-calcified plaque with stenosis 50-69%. Distal RCA/PDA/PLA have minimal plaque. Left main is a large artery that gives rise to LAD and LCX arteries. Left main has minimal plaque. LAD is a medium caliber vessel that gives rise to one large diagonal artery. Ostial LAD has a moderate  calcified plaque with stenosis 50-69%. Proximal LAD has a long, severe, mixed, predominantly calcified plaque with stenosis > 70% at the distal portion of the plaque. Mid to distal LAD has mild diffuse plaque < 50%. D1 is a large artery that sub-branches. There is severe calcified plaque in the proximal portion with stenosis suspicious for > 70%. LCX is a medium size non-dominant artery. There is moderate calcified ostial plaque with stenosis 50-69%. Other findings: Normal pulmonary vein drainage into the left atrium. Normal let atrial appendage without a thrombus. Normal size of the pulmonary artery. IMPRESSION: 1. Coronary calcium score of 777. This was 95% percentile for age and sex matched control. 2. Normal coronary origin with right dominance. 3. Diffuse three vessel disease with suspicion for > 70% stenosis in the proximal LAD and proximal portion of the 1. diagonal artery. Cardiac catheterization is recommended. Electronically Signed   By: Ena Dawley   On: 05/22/2018 09:50   Result Date: 05/22/2018 EXAM: OVER-READ INTERPRETATION  CT CHEST The following report is an over-read performed by radiologist Dr. Rolm Baptise of [facility], [place] on 05/18/2018. This over-read does not include interpretation of cardiac or coronary anatomy or pathology. The coronary CTA interpretation by the cardiologist is attached. COMPARISON:  08/16/2008 FINDINGS: Vascular: Heart is normal size. Visualized aorta is normal caliber. Scattered calcifications in the descending thoracic aorta. Mediastinum/Nodes: No adenopathy in the lower mediastinum or hila. Lungs/Pleura: Visualized lungs clear.  No effusions. Upper Abdomen: Imaging into the upper abdomen shows no acute findings. Musculoskeletal: Chest wall soft tissues are unremarkable. No acute bony abnormality. IMPRESSION: No acute or significant extracardiac abnormality. Electronically Signed: By: Rolm Baptise M.D. On: 05/18/2018 12:34   Dg Chest Portable 1  View  Result Date: 05/26/2018 CLINICAL DATA:  Chest pain EXAM: PORTABLE CHEST 1 VIEW COMPARISON:  04/14/2018 FINDINGS: The heart size and mediastinal contours are within normal limits. Both lungs are clear. The visualized skeletal structures are unremarkable. IMPRESSION: No active disease. Electronically Signed   By: Franchot Gallo M.D.   On: 05/26/2018 13:40   Disposition   Pt is being discharged home today in good condition.  Follow-up Plans & Appointments    Follow-up Information    Belva Crome, MD Follow up.   Specialty:  Cardiology Why:  our office will call you with a hospital f/u visit  Contact information: [address] [address] [phone]          Discharge Instructions    Diet - low sodium heart healthy  Complete by:  As directed    Increase activity slowly   Complete by:  As directed       Discharge Medications   Allergies as of 05/28/2018      Reactions   Cymbalta [duloxetine Hcl] Hives, Itching   Effexor [venlafaxine] Nausea Only   Other    "SEEDED" food due to stomach issues   Statins Nausea And Vomiting   Penicillins Hives, Rash   Has patient had a PCN reaction causing immediate rash, facial/tongue/throat swelling, SOB or lightheadedness with hypotension: Yes Has patient had a PCN reaction causing severe rash involving mucus membranes or skin necrosis: No Has patient had a PCN reaction that required hospitalization: No Has patient had a PCN reaction occurring within the last 10 years: Yes If all of the above answers are "NO", then may proceed with Cephalosporin use.      Medication List    STOP taking these medications   docusate sodium 100 MG capsule Commonly known as:  COLACE   metoprolol tartrate 50 MG tablet Commonly known as:  LOPRESSOR     TAKE these medications   ACCU-CHEK AVIVA PLUS test strip Generic drug:  glucose blood USE TO CHECK BLOOD SUGARS  TWO TIMES DAILY   ACCU-CHEK AVIVA PLUS  w/Device Kit 1 kit by Does not apply route once.   aspirin EC 81 MG tablet Take 1 tablet (81 mg total) by mouth daily.   azelastine 0.1 % nasal spray Commonly known as:  ASTELIN Place 2 sprays into both nostrils 2 (two) times daily as needed for rhinitis. Use in each nostril as directed   BEANO PO Take 1-2 tablets by mouth daily as needed (for gas).   CLEAR EYES MAXIMUM ITCHY EYE OP Place 1 drop into both eyes every 8 (eight) hours as needed (itchy eyes).   clonazePAM 0.5 MG tablet Commonly known as:  KLONOPIN TAKE 1/2 TO 1 TABLET TWICE A DAY AS NEEDED FOR ANXIETY What changed:  See the new instructions.   ezetimibe 10 MG tablet Commonly known as:  ZETIA Take 1 tablet (10 mg total) by mouth daily.   famotidine 20 MG tablet Commonly known as:  PEPCID TAKE 1 TABLET (20 MG TOTAL) BY MOUTH DAILY. What changed:  See the new instructions.   gabapentin 800 MG tablet Commonly known as:  NEURONTIN TAKE 1 TABLET BY MOUTH 3  TIMES DAILY   glipiZIDE 5 MG 24 hr tablet Commonly known as:  GLUCOTROL XL TAKE 1 TABLET BY MOUTH EVERY DAY What changed:  when to take this   hydrochlorothiazide 25 MG tablet Commonly known as:  HYDRODIURIL Take 1 tablet (25 mg total) by mouth daily.   metFORMIN 500 MG 24 hr tablet Commonly known as:  GLUCOPHAGE-XR Take 1 tablet (500 mg total) by mouth daily with breakfast. What changed:  See the new instructions.   multivitamin capsule Take 1 capsule by mouth daily.   nitroGLYCERIN 0.4 MG SL tablet Commonly known as:  NITROSTAT Place 1 tablet (0.4 mg total) under the tongue every 5 (five) minutes as needed for chest pain.   OXcarbazepine 150 MG tablet Commonly known as:  TRILEPTAL Take 1 tablet (150 mg total) by mouth 2 (two) times daily.   polyethylene glycol packet Commonly known as:  MIRALAX / GLYCOLAX Take 17 g by mouth daily. What changed:    when to take this  reasons to take this   potassium chloride SA 20 MEQ tablet Commonly  known as:  K-DUR,KLOR-CON Take 2 tablets (  40 mEq total) by mouth daily. What changed:    how much to take  when to take this   PROAIR HFA 108 (90 Base) MCG/ACT inhaler Generic drug:  albuterol Inhale 2 puffs into the lungs every 6 (six) hours as needed for wheezing or shortness of breath.   PROBIOTIC PO Take 1 capsule by mouth daily after breakfast.   ranitidine 150 MG tablet Commonly known as:  ZANTAC Take 1 tablet (150 mg total) by mouth 2 (two) times daily.   rosuvastatin 5 MG tablet Commonly known as:  CRESTOR Take 1 tablet (5 mg total) by mouth daily. Start taking on:  05/29/2018   senna-docusate 8.6-50 MG tablet Commonly known as:  Senokot-S Take 2 tablets by mouth 2 (two) times daily.   sucralfate 1 g tablet Commonly known as:  CARAFATE Take 1 tablet (1 g total) by mouth 4 (four) times daily -  with meals and at bedtime for 7 days.   traMADol 50 MG tablet Commonly known as:  ULTRAM TAKE 1 TABLET EVERY 12 HOURS AS NEEDED        Acute coronary syndrome (MI, NSTEMI, STEMI, etc) this admission?: No.    Outstanding Labs/Studies   HFTs and FLP in 6-8 weeks. LDL goal < 70 mg/dL.   Duration of Discharge Encounter   Greater than 30 minutes including physician time.  Signed, Lyda Jester, PA-C 05/28/2018, 2:10 PM   Agree with note written by Ellen Henri  [facility]  Ms. Robey underwent left heart cath by Dr. Tamala Julian this morning via right radial approach revealing high-grade ostial first diagonal branch stenosis not amenable to percutaneous intervention.  Medical therapy was recommended.  Exam is benign.  She is stable for discharge.  Follow-up will be arranged with Dr. Tamala Julian.  Quay Burow 05/28/2018 2:48 PM

## 2018-05-28 NOTE — Interval H&P Note (Signed)
Cath Lab Visit (complete for each Cath Lab visit)  Clinical Evaluation Leading to the Procedure:   ACS: No.  Non-ACS:    Anginal Classification: CCS II  Anti-ischemic medical therapy: Minimal Therapy (1 class of medications)  Non-Invasive Test Results: Intermediate-risk stress test findings: cardiac mortality 1-3%/year  Prior CABG: No previous CABG      History and Physical Interval Note:  05/28/2018 8:45 AM  [name]  has presented today for surgery, with the diagnosis of unstable angina  The various methods of treatment have been discussed with the patient and family. After consideration of risks, benefits and other options for treatment, the patient has consented to  Procedure(s): LEFT HEART CATH AND CORONARY ANGIOGRAPHY (N/A) as a surgical intervention .  The patient's history has been reviewed, patient examined, no change in status, stable for surgery.  I have reviewed the patient's chart and labs.  Questions were answered to the patient's satisfaction.     Belva Crome III

## 2018-05-28 NOTE — CV Procedure (Signed)
   Coronary angiography via right radial using vascular ultrasound for access guidance.  Early branch of the first diagonal with ostial 90% stenosis.  Small to moderate distribution beyond.  The other branch of the diagonal is widely patent.  Patent left main, heavy plaque in LAD but less than 50% narrowing, ostial 50 to 60% circumflex, widely patent RCA with anterior takeoff.  Normal left ventricular function and hemodynamics.  Medical therapy of diagonal branch disease.  Current chest pain is not felt related to coronary artery disease.  No complications to this point.

## 2018-05-28 NOTE — Progress Notes (Signed)
I visited with the patient and her daughter prior to her being transported to her procedure. I provided spiritual support by sharing words of encouragement and by leading in prayer. I shared that the Chaplain is available for additional support as needed or requested.    05/28/18 0900  Clinical Encounter Type  Visited With Patient and family together  Visit Type Spiritual support  Referral From Nurse  Consult/Referral To Chaplain  Spiritual Encounters  Spiritual Needs Prayer    Chaplain Dr Redgie Grayer

## 2018-05-28 NOTE — Progress Notes (Signed)
Pt left unit in bed  accompanied by transporter and daughter  For procedural area.

## 2018-05-28 NOTE — Plan of Care (Signed)

## 2018-05-28 NOTE — Progress Notes (Signed)
Pt returned to unit , drowsy , but alert and oriented, , now resting in bed, no bleeding from tr band.

## 2018-05-28 NOTE — Progress Notes (Signed)
Progress Note  Patient Name: [name] Date of Encounter: 05/28/2018  Primary Cardiologist: Sinclair Grooms, MD   Subjective   [name] had diagnostic coronary angiography today by Dr. Tamala Julian via the right radial approach that showed a high-grade ostial diagonal branch stenosis not amenable to percutaneous intervention.  He recommended medical therapy.  Inpatient Medications    Scheduled Meds: . [START ON 05/29/2018] aspirin  81 mg Oral Daily  . [START ON 05/29/2018] rosuvastatin  5 mg Oral Daily  . senna-docusate  2 tablet Oral BID  . sodium chloride flush  3 mL Intravenous Q12H   Continuous Infusions: . sodium chloride 100 mL/hr at 05/28/18 1121  . sodium chloride     PRN Meds: sodium chloride, nitroGLYCERIN, oxyCODONE, sodium chloride flush   Vital Signs    Vitals:   05/28/18 0948 05/28/18 0949 05/28/18 1000 05/28/18 1114  BP:   109/69 116/62  Pulse: 92 81 (!) 54 (!) 55  Resp:      Temp:   (!) 97.4 F (36.3 C) (!) 97.4 F (36.3 C)  TempSrc:   Oral Oral  SpO2: 98% 98% 98% 98%  Weight:      Height:        Intake/Output Summary (Last 24 hours) at 05/28/2018 1321 Last data filed at 05/28/2018 0600 Gross per 24 hour  Intake 538.18 ml  Output 0 ml  Net 538.18 ml   Filed Weights   05/26/18 1845 05/27/18 1955 05/28/18 0443  Weight: 84.3 kg 83.3 kg 83.3 kg    Telemetry    Sinus rhythm- Personally Reviewed  ECG    Sinus bradycardia at 56- Personally Reviewed  Physical Exam   GEN: No acute distress.   Neck: No JVD Cardiac: RRR, no murmurs, rubs, or gallops.  Respiratory: Clear to auscultation bilaterally. GI: Soft, nontender, non-distended  MS: No edema; No deformity. Neuro:  Nonfocal  Psych: Normal affect   Labs    Chemistry Recent Labs  Lab 05/26/18 1319 05/26/18 1858 05/27/18 0620  NA 135  --  133*  K 3.1*  --  3.5  CL 94*  --  95*  CO2 26  --  26  GLUCOSE 169*  --  153*  BUN 10  --  14  CREATININE 0.79 0.89 0.87  CALCIUM 10.1  --   9.7  PROT 7.6  --   --   ALBUMIN 4.0  --   --   AST 34  --   --   ALT 26  --   --   ALKPHOS 54  --   --   BILITOT 0.6  --   --   GFRNONAA >60 >60 >60  GFRAA >60 >60 >60  ANIONGAP 15  --  12     Hematology Recent Labs  Lab 05/26/18 1319 05/26/18 1858 05/27/18 0620  WBC 6.0 8.3 5.6  RBC 4.36 4.42 4.21  HGB 13.0 13.2 12.6  HCT 39.9 40.7 38.9  MCV 91.5 92.1 92.4  MCH 29.8 29.9 29.9  MCHC 32.6 32.4 32.4  RDW 12.8 13.0 13.2  PLT 278 272 276    Cardiac Enzymes Recent Labs  Lab 05/26/18 1319 05/26/18 1858 05/27/18 0046 05/27/18 0620  TROPONINI <0.03 <0.03 <0.03 <0.03    Recent Labs  Lab 05/26/18 1329  TROPIPOC 0.00     BNPNo results for input(s): BNP, PROBNP in the last 168 hours.   DDimer No results for input(s): DDIMER in the last 168 hours.   Radiology  Dg Chest Portable 1 View  Result Date: 05/26/2018 CLINICAL DATA:  Chest pain EXAM: PORTABLE CHEST 1 VIEW COMPARISON:  04/14/2018 FINDINGS: The heart size and mediastinal contours are within normal limits. Both lungs are clear. The visualized skeletal structures are unremarkable. IMPRESSION: No active disease. Electronically Signed   By: Franchot Gallo M.D.   On: 05/26/2018 13:40    Cardiac Studies   Diagnostic coronary angiography (05/28/2018)  Conclusion    The first diagonal is a large vessel that immediately branches into 2 equally sized subbranches.  The more medial branch contains ostial 90% stenosis.  Left main is widely patent  LAD contains diffuse 30% mid vessel stenosis.  Ostial circumflex contains 50 to 60% eccentric narrowing.  RCA contains mid eccentric 30% narrowing.  RCA also has a diagonal with origin and anterior takeoff.  Normal left ventricular systolic function with EF greater than 60% and normal filling pressures.  RECOMMENDATIONS:   Aspirin  Statin therapy  Diagonal branch is not a good interventional target due to ostial location/bifurcation.     Patient Profile      77 y.o. female with chest pain, risk factors of hypertension diabetes who had a CT FFR that showed proximal LAD and diagonal branch disease.  Carotid catheterization today revealed high-grade ostial diagonal branch stenosis not amenable to percutaneous intervention.  Assessment & Plan    1: Coronary artery disease- diagnostic coronary angiography today performed Dr. Tamala Julian radially revealed a high-grade diagonal branch ostial lesion not amenable to percutaneous intervention.  Medical therapy was recommended.  2: Essential hypertension- controlled on current medications  [name] is stable for discharge home today.  She will follow-up with Dr. Tamala Julian as an outpatient.  For questions or updates, please contact [place] Please consult [email or web address] for contact info under        Signed, Quay Burow, MD  05/28/2018, 1:21 PM

## 2018-05-28 NOTE — Progress Notes (Signed)
Pt left unit in wheel chair accompanied by nurse and daughter.

## 2018-05-30 ENCOUNTER — Ambulatory Visit (HOSPITAL_COMMUNITY)
Admission: RE | Admit: 2018-05-30 | Payer: Medicare Other | Source: Ambulatory Visit | Admitting: Interventional Cardiology

## 2018-05-30 ENCOUNTER — Encounter (HOSPITAL_COMMUNITY): Admission: RE | Payer: Self-pay | Source: Ambulatory Visit

## 2018-05-30 SURGERY — LEFT HEART CATH AND CORONARY ANGIOGRAPHY
Anesthesia: LOCAL

## 2018-06-08 ENCOUNTER — Other Ambulatory Visit: Payer: Self-pay | Admitting: Neurology

## 2018-06-13 ENCOUNTER — Other Ambulatory Visit: Payer: Self-pay | Admitting: Internal Medicine

## 2018-06-15 ENCOUNTER — Telehealth: Payer: Self-pay | Admitting: Interventional Cardiology

## 2018-06-15 NOTE — Telephone Encounter (Signed)
Spoke with pt and she wanted to know if she needed to continue Nexium.  Advised pt to contact her PCP as this medication is for reflux.  Pt verbalized understanding.

## 2018-06-15 NOTE — Telephone Encounter (Signed)
New Message   Pt c/o medication issue:  1. Name of Medication: Nexium  2. How are you currently taking this medication (dosage and times per day)?   3. Are you having a reaction (difficulty breathing--STAT)?   4. What is your medication issue? Patient is calling to inquiry about whether or not she should continue this medication. Please call to discuss.

## 2018-06-22 ENCOUNTER — Telehealth: Payer: Self-pay | Admitting: *Deleted

## 2018-06-22 NOTE — Telephone Encounter (Signed)
I called number below. No answer. Will try again later.    Copied from [place] [phone]. Topic: General - Other >> Jun 15, 2018  1:39 PM Bea Graff, NT wrote: Reason for CRM: Darlen Round with [facility] calling to speak with an nurse regarding pts last A1C level. CB#" [phone]

## 2018-06-22 NOTE — Telephone Encounter (Signed)
I called [employer] at number below. I spoke to a representative and advised of patient's most recent Hgb A1c on 02/07/18 of 8.0.

## 2018-07-08 NOTE — Progress Notes (Signed)
Follow-up Visit   Date: 07/09/18    [name] MRN: [id number] DOB: 1940-11-10   Interim History: [name] is a 78 y.o. right-handed African American female with well controlled diabetes mellitus (HbA1c 6.7), unspecified seizure disorder (diagnosed 2009, well-controlled on OXC), hypertension, GERD, depression/anxiety, and osteoarthritis returning to the clinic for follow-up of painful neuropathy.  The patient was accompanied to the clinic by self.  History of present illness: She has been diabetic since early 2000 and was initially taking insulin and then transition to oral medications. In ~ 2008, she began having numbness of the toes which worsened since 2012.  She has tingling and numbness of the feet, ankles, and lower legs. On rare occasions she has burning sensation of the face. She is taking gabapentin 620m three times daily which seems to help, but does not completely alleviate the pain. This was gradually titrated to 800 mg 3 times daily, however she could not tolerate this due to complaints of hives.  In 2013, she underwent EMG of the upper which showed bilateral CTS.  Starting in 2009, she started having rare spells of noctural generalized seizures with her last seizure in 2014. She does not recall any details because she looses consciousness. His husband tells her that she is shaking and biting her tongue. No history of urinary or bowel incontinence. Duration is ~ 5 min. When she is awake, she is is tired and confused. She is not driving. She is currently taking oxcarbazepine 2212mtwice daily and denies any side effects. No family history of seizures.  Her husband passed away in Se30-Sep-2017   UPDATE 07/09/2018: At her last visit in October 2018, she offered nortriptyline but she did not tolerate this.  Currently, she takes gabapentin 80041mhree times daily and extra 100m47m needed, which eases her pain.  She is never completely pain-free.  She has some  imbalance and uses a cane as needed.  She has not suffered any falls.  She has been having sense of fullness in the throat and intermittently has difficulty swallowing.  No problems with speech, double vision, or generalized weakness.     Medications:  Current Outpatient Medications on File Prior to Visit  Medication Sig Dispense Refill  . ACCU-CHEK AVIVA PLUS test strip USE TO CHECK BLOOD SUGARS  TWO TIMES DAILY 200 each 2  . albuterol (PROAIR HFA) 108 (90 BASE) MCG/ACT inhaler Inhale 2 puffs into the lungs every 6 (six) hours as needed for wheezing or shortness of breath.     . Alpha-D-Galactosidase (BEANO PO) Take 1-2 tablets by mouth daily as needed (for gas).    . asMarland Kitchenirin EC 81 MG tablet Take 1 tablet (81 mg total) by mouth daily. 90 tablet 3  . azelastine (ASTELIN) 0.1 % nasal spray Place 2 sprays into both nostrils 2 (two) times daily as needed for rhinitis. Use in each nostril as directed    . Blood Glucose Monitoring Suppl (ACCU-CHEK AVIVA PLUS) W/DEVICE KIT 1 kit by Does not apply route once. 1 kit 0  . clonazePAM (KLONOPIN) 0.5 MG tablet TAKE 1/2 TO 1 TABLET TWICE A DAY AS NEEDED FOR ANXIETY (Patient taking differently: Take 0.25-0.5 mg by mouth 2 (two) times daily as needed for anxiety. ) 60 tablet 0  . ezetimibe (ZETIA) 10 MG tablet Take 1 tablet (10 mg total) by mouth daily. 90 tablet 3  . famotidine (PEPCID) 20 MG tablet TAKE 1 TABLET (20 MG TOTAL) BY MOUTH DAILY. (Patient  taking differently: Take 20 mg by mouth daily. ) 90 tablet 1  . gabapentin (NEURONTIN) 100 MG capsule Take 100 mg by mouth 3 (three) times daily.    Marland Kitchen gabapentin (NEURONTIN) 800 MG tablet TAKE 1 TABLET BY MOUTH 3  TIMES DAILY (Patient taking differently: Take 800 mg by mouth 3 (three) times daily. ) 270 tablet 1  . glipiZIDE (GLUCOTROL XL) 5 MG 24 hr tablet TAKE 1 TABLET BY MOUTH EVERY DAY (Patient taking differently: Take 5 mg by mouth daily with breakfast. ) 90 tablet 1  . hydrochlorothiazide (HYDRODIURIL) 25  MG tablet Take 1 tablet (25 mg total) by mouth daily. 90 tablet 1  . metFORMIN (GLUCOPHAGE-XR) 500 MG 24 hr tablet Take 1 tablet (500 mg total) by mouth daily with breakfast. 90 tablet 3  . Multiple Vitamin (MULTIVITAMIN) capsule Take 1 capsule by mouth daily.     . Naphazoline-Glycerin-Zinc Sulf (CLEAR EYES MAXIMUM ITCHY EYE OP) Place 1 drop into both eyes every 8 (eight) hours as needed (itchy eyes).    . nitroGLYCERIN (NITROSTAT) 0.4 MG SL tablet Place 1 tablet (0.4 mg total) under the tongue every 5 (five) minutes as needed for chest pain. 25 tablet 2  . OXcarbazepine (TRILEPTAL) 150 MG tablet TAKE 1 TABLET BY MOUTH TWICE A DAY 180 tablet 0  . polyethylene glycol (MIRALAX / GLYCOLAX) packet Take 17 g by mouth daily. (Patient taking differently: Take 17 g by mouth as needed for moderate constipation. ) 14 each 0  . potassium chloride SA (KLOR-CON M20) 20 MEQ tablet Take 1 tablet (20 mEq total) by mouth 2 (two) times daily. 180 tablet 1  . Probiotic Product (PROBIOTIC PO) Take 1 capsule by mouth daily after breakfast.     . ranitidine (ZANTAC) 150 MG tablet Take 1 tablet (150 mg total) by mouth 2 (two) times daily. 60 tablet 5  . rosuvastatin (CRESTOR) 5 MG tablet Take 1 tablet (5 mg total) by mouth daily. 30 tablet 5  . senna-docusate (SENOKOT-S) 8.6-50 MG tablet Take 2 tablets by mouth 2 (two) times daily. 60 tablet 0  . traMADol (ULTRAM) 50 MG tablet TAKE 1 TABLET EVERY 12 HOURS AS NEEDED 60 tablet 1  . sucralfate (CARAFATE) 1 g tablet Take 1 tablet (1 g total) by mouth 4 (four) times daily -  with meals and at bedtime for 7 days. (Patient not taking: Reported on 05/23/2018) 28 tablet 0   No current facility-administered medications on file prior to visit.     Allergies:  Allergies  Allergen Reactions  . Cymbalta [Duloxetine Hcl] Hives and Itching  . Effexor [Venlafaxine] Nausea Only  . Other     "SEEDED" food due to stomach issues  . Statins Nausea And Vomiting  . Penicillins Hives  and Rash    Has patient had a PCN reaction causing immediate rash, facial/tongue/throat swelling, SOB or lightheadedness with hypotension: Yes Has patient had a PCN reaction causing severe rash involving mucus membranes or skin necrosis: No Has patient had a PCN reaction that required hospitalization: No Has patient had a PCN reaction occurring within the last 10 years: Yes If all of the above answers are "NO", then may proceed with Cephalosporin use.     Review of Systems:  CONSTITUTIONAL: No fevers, chills, night sweats, or weight loss.  EYES: No visual changes or eye pain ENT: No hearing changes.  No history of nose bleeds.   RESPIRATORY: No cough, wheezing and shortness of breath.   CARDIOVASCULAR: Negative for chest pain,  and palpitations.   GI: Negative for abdominal discomfort, blood in stools or black stools.  No recent change in bowel habits.   GU:  No history of incontinence.   MUSCLOSKELETAL: +history of joint pain or swelling.  No myalgias.   SKIN: Negative for lesions, rash, and itching.   ENDOCRINE: Negative for cold or heat intolerance, polydipsia or goiter.   PSYCH:  + depression or anxiety symptoms.   NEURO: As Above.   Vital Signs:  BP 110/70   Pulse 61   Ht _0  (1.575 m)   Wt 181 lb 2 oz (82.2 kg)   SpO2 97%   BMI 33.13 kg/m   General Medical Exam:   General:  Well appearing, comfortable  Eyes/ENT: see cranial nerve examination.   Neck: No masses appreciated.  Full range of motion without tenderness.  No carotid bruits. Respiratory:  Clear to auscultation, good air entry bilaterally.   Cardiac:  Regular rate and rhythm, no murmur.   Ext:  No edema  Neurological Exam: MENTAL STATUS including orientation to time, place, person, recent and remote memory, attention span and concentration, language, and fund of knowledge is normal.  Speech is not dysarthric.  CRANIAL NERVES:  Pupils equal round and reactive to light.  Normal conjugate, extra-ocular eye  movements in all directions of gaze.  Subtle left ptosis.  Face is symmetric. Palate elevates symmetrically.  Tongue is midline.  MOTOR:  Motor strength is 5/5 in all extremities, including distally in the feet.Marland Kitchen  No pronator drift.  Tone is normal.    MSRs:  Right                                                                 Left brachioradialis 2+  brachioradialis 2+  biceps 2+  biceps 2+  triceps 2+  triceps 2+  patellar 1+  Patellar 1+  ankle jerk 0  ankle jerk 0   SENSORY:  Reduced vibration at knees and distally, following a gradient pattern.  COORDINATION/GAIT:  Normal finger-to- nose-finger.  Intact rapid alternating movements bilaterally.  Gait mildly wide-based and stable, she is wearing 1 inch heels.   Data: Lab Results  Component Value Date   HGBA1C 8.0 (H) 02/07/2018   Lab Results  Component Value Date   VITAMINB12 1,167 (H) 04/21/2017   Lab Results  Component Value Date   TSH 3.53 07/21/2017   EMG of the upper extremities and left lower 02/20/2012: Bilateral mild carpal tunnel syndrome, no evidence of neuropathy or cervical radiculopathy or lumbosacral radiculopathy  MRI/A brain wo contrast 10/04/2006: Atrophy and small vessel disease, no stenosis   IMPRESSION/PLAN: 1.  Painful diabetic neuropathy affecting the lower extremities, stable Previously tried: Cymbalta, Lyrica (cost prohibitive), nortriptyline Continue gabapentin 858m three times daily + extra 1033mdaily as needed Try OTC lidocaine cream to feet twice daily as needed Physical therapy for balance therapy declined, as she may be started water aerobics Daily foot inspection advised Fall precautions were discussed, such as using a shower chair and a cane at all times  2.  Nocturnal grand mal seizures well-controlled on oxcarbamazepine 150 mg twice daily.  MRI brain (2008) does not show any focal abnormalities.  Last seizure in 2014.  3.  Dysphagia, no signs of neuromuscular weakness.  Recommend  follow-up with PCP for further evaluation  Return to clinic in 6 months.   Thank you for allowing me to participate in patient's care.  If I can answer any additional questions, I would be pleased to do so.    Sincerely,    Su Duma K. Posey Pronto, DO

## 2018-07-09 ENCOUNTER — Encounter

## 2018-07-09 ENCOUNTER — Encounter: Payer: Self-pay | Admitting: Neurology

## 2018-07-09 ENCOUNTER — Ambulatory Visit (INDEPENDENT_AMBULATORY_CARE_PROVIDER_SITE_OTHER): Payer: Medicare Other | Admitting: Neurology

## 2018-07-09 VITALS — BP 110/70 | HR 61 | Ht 62.0 in | Wt 181.1 lb

## 2018-07-09 DIAGNOSIS — R1310 Dysphagia, unspecified: Secondary | ICD-10-CM

## 2018-07-09 DIAGNOSIS — R569 Unspecified convulsions: Secondary | ICD-10-CM | POA: Diagnosis not present

## 2018-07-09 DIAGNOSIS — E1142 Type 2 diabetes mellitus with diabetic polyneuropathy: Secondary | ICD-10-CM

## 2018-07-09 NOTE — Patient Instructions (Addendum)
Try over the counter lidocaine cream to feet twice daily as needed  Continue gabapentin 800mg  three times daily + extra 100mg  daily as needed  Continue oxcarbamazepine 150mg  twice daily  Return to clinic in 6 months

## 2018-07-10 ENCOUNTER — Telehealth: Payer: Self-pay | Admitting: Interventional Cardiology

## 2018-07-10 NOTE — Telephone Encounter (Signed)
New Message   Patient wants to speak to the nurse to go over her medications she had prescribed by Dr. Tamala Julian.  She has a few questions about them.  No issues with taking the medication.   nitroglyceren rosuvastatin

## 2018-07-10 NOTE — Telephone Encounter (Signed)
Spoke with pt and answered all questions regarding her medications.  Pt appreciative for call.

## 2018-07-17 NOTE — Progress Notes (Signed)
Subjective:    Patient ID: [name], female    DOB: 01-Apr-1941, 78 y.o.   MRN: [id number]  HPI The patient is here for follow up.  Diabetic polyneuropathy:  She is taking gabapentin multiple times a day and it does not seem to be helping.  She is taking 92m  In the morning and then 800 mg every 3 hours - she is taking several doses.  She wonders what other options there are.    She is taking nexium daily and she read that it can cause stomach bleeding and she wondered if she stop the medication.  She had RLQ pain a couple of days ago and wondered if it was related to the nexium.  The pain has stopped.  It made her nervous.    She wonders if she should be on a blood thinner.  She had recent cardiac testing and has CAD.  She takes a baby aspirin.  Cardiology advised her she did not need it.      Soreness right lateral neck - she wonders if it is lymph nodes.  Her hearing in the right ear is affected - she needs to get hearing aides. She wondered if the swelling is affecting her hearing.     With her head in a certain position she has pain on the right side of her head.  She knows her spine is a wreck.    Diabetes: She is taking her medication daily as prescribed. She is compliant with a diabetic diet.      Medications and allergies reviewed with patient and updated if appropriate.  Patient Active Problem List   Diagnosis Date Noted  . CAD (coronary artery disease) 05/28/2018  . Abnormal CT scan, heart   . Atypical chest pain 05/26/2018  . Neck pain 07/21/2017  . Abdominal distension (gaseous) 04/10/2017  . Rash and nonspecific skin eruption 04/10/2017  . Pruritus 02/01/2017  . Decreased hearing of right ear 02/01/2017  . Primary osteoarthritis of left knee 01/12/2017  . Left knee DJD 01/12/2017  . Constipation 01/10/2017  . Hypercalcemia 10/12/2016  . Chest pain 10/12/2016  . DOE (dyspnea on exertion) 10/12/2016  . Chronic back pain 08/23/2016  . Hair loss 08/23/2016    . Diabetes ([place] 07/26/2016  . Anxiety 09/14/2015  . Muscle pain, myofacial 05/19/2015  . Diverticulitis large intestine w/o perforation or abscess w/o bleeding 05/12/2014  . Essential hypertension 05/12/2014  . DM neuropathy, type II diabetes mellitus ([place] 05/12/2014  . Seizure disorder ([place] 05/12/2014  . Hepatic steatosis 05/12/2014  . Obesity (BMI 30-39.9) 05/12/2014  . Cerebrovascular disease, unspecified 03/13/2013  . Allergic rhinitis 03/13/2013  . GOITER, MULTINODULAR 05/18/2009  . VERTIGO 04/13/2009  . Irritable bowel syndrome 03/20/2009  . INSOMNIA 03/20/2009  . Hyperlipidemia 03/18/2009  . GERD 03/18/2009  . Osteoarthritis 03/18/2009    Current Outpatient Medications on File Prior to Visit  Medication Sig Dispense Refill  . ACCU-CHEK AVIVA PLUS test strip USE TO CHECK BLOOD SUGARS  TWO TIMES DAILY 200 each 2  . albuterol (PROAIR HFA) 108 (90 BASE) MCG/ACT inhaler Inhale 2 puffs into the lungs every 6 (six) hours as needed for wheezing or shortness of breath.     . Alpha-D-Galactosidase (BEANO PO) Take 1-2 tablets by mouth daily as needed (for gas).    .Marland Kitchenaspirin EC 81 MG tablet Take 1 tablet (81 mg total) by mouth daily. 90 tablet 3  . azelastine (ASTELIN) 0.1 % nasal spray Place 2 sprays  into both nostrils 2 (two) times daily as needed for rhinitis. Use in each nostril as directed    . Blood Glucose Monitoring Suppl (ACCU-CHEK AVIVA PLUS) W/DEVICE KIT 1 kit by Does not apply route once. 1 kit 0  . clonazePAM (KLONOPIN) 0.5 MG tablet TAKE 1/2 TO 1 TABLET TWICE A DAY AS NEEDED FOR ANXIETY (Patient taking differently: Take 0.25-0.5 mg by mouth 2 (two) times daily as needed for anxiety. ) 60 tablet 0  . ezetimibe (ZETIA) 10 MG tablet Take 1 tablet (10 mg total) by mouth daily. 90 tablet 3  . famotidine (PEPCID) 20 MG tablet TAKE 1 TABLET (20 MG TOTAL) BY MOUTH DAILY. (Patient taking differently: Take 20 mg by mouth daily. ) 90 tablet 1  . gabapentin (NEURONTIN) 100 MG capsule  Take 100 mg by mouth 3 (three) times daily.    Marland Kitchen gabapentin (NEURONTIN) 800 MG tablet TAKE 1 TABLET BY MOUTH 3  TIMES DAILY (Patient taking differently: Take 800 mg by mouth 3 (three) times daily. ) 270 tablet 1  . glipiZIDE (GLUCOTROL XL) 5 MG 24 hr tablet TAKE 1 TABLET BY MOUTH EVERY DAY (Patient taking differently: Take 5 mg by mouth daily with breakfast. ) 90 tablet 1  . hydrochlorothiazide (HYDRODIURIL) 25 MG tablet Take 1 tablet (25 mg total) by mouth daily. 90 tablet 1  . metFORMIN (GLUCOPHAGE-XR) 500 MG 24 hr tablet Take 1 tablet (500 mg total) by mouth daily with breakfast. 90 tablet 3  . Multiple Vitamin (MULTIVITAMIN) capsule Take 1 capsule by mouth daily.     . Naphazoline-Glycerin-Zinc Sulf (CLEAR EYES MAXIMUM ITCHY EYE OP) Place 1 drop into both eyes every 8 (eight) hours as needed (itchy eyes).    . nitroGLYCERIN (NITROSTAT) 0.4 MG SL tablet Place 1 tablet (0.4 mg total) under the tongue every 5 (five) minutes as needed for chest pain. 25 tablet 2  . OXcarbazepine (TRILEPTAL) 150 MG tablet TAKE 1 TABLET BY MOUTH TWICE A DAY 180 tablet 0  . polyethylene glycol (MIRALAX / GLYCOLAX) packet Take 17 g by mouth daily. (Patient taking differently: Take 17 g by mouth as needed for moderate constipation. ) 14 each 0  . potassium chloride SA (KLOR-CON M20) 20 MEQ tablet Take 1 tablet (20 mEq total) by mouth 2 (two) times daily. 180 tablet 1  . Probiotic Product (PROBIOTIC PO) Take 1 capsule by mouth daily after breakfast.     . rosuvastatin (CRESTOR) 5 MG tablet Take 1 tablet (5 mg total) by mouth daily. 30 tablet 5  . senna-docusate (SENOKOT-S) 8.6-50 MG tablet Take 2 tablets by mouth 2 (two) times daily. 60 tablet 0  . traMADol (ULTRAM) 50 MG tablet TAKE 1 TABLET EVERY 12 HOURS AS NEEDED 60 tablet 1   No current facility-administered medications on file prior to visit.     Past Medical History:  Diagnosis Date  . Allergic rhinitis, cause unspecified 03/13/2013  . Anxiety   .  Cerebrovascular disease, unspecified 03/13/2013   Atrophy and small vessel dz noted, MR brain 2009  . Depression   . Diabetes mellitus, type 2 ([place])   . Diverticulosis of colon 03/2010 hosp  . Dyslipidemia   . GERD (gastroesophageal reflux disease)   . Hypertension   . Neuropathy   . OSA on CPAP   . Osteoarthritis of shoulder region    and Knee  . Seizure disorder ([place])    onset 11 years ago; repeated 2013    Past Surgical History:  Procedure Laterality Date  .  ABDOMINAL HYSTERECTOMY  1970's   Partial  . APPENDECTOMY    . CHOLECYSTECTOMY    . LEFT HEART CATH AND CORONARY ANGIOGRAPHY N/A 05/28/2018   Procedure: LEFT HEART CATH AND CORONARY ANGIOGRAPHY;  Surgeon: Belva Crome, MD;  Location: [facility];  Service: Cardiovascular;  Laterality: N/A;  . LUMBAR EPIDURAL INJECTION Left 08/25/2017  . SHOULDER SURGERY  2008   LT, post fall   . TONSILLECTOMY AND ADENOIDECTOMY    . TOTAL KNEE ARTHROPLASTY Left 01/12/2017   Procedure: LEFT TOTAL KNEE ARTHROPLASTY;  Surgeon: Susa Day, MD;  Location: [facility];  Service: Orthopedics;  Laterality: Left;  120 mins    Social History   Socioeconomic History  . Marital status: Widowed    Spouse name: Not on file  . Number of children: 2  . Years of education: Not on file  . Highest education level: Not on file  Occupational History  . Not on file  Social Needs  . Financial resource strain: Not hard at all  . Food insecurity:    Worry: Never true    Inability: Never true  . Transportation needs:    Medical: No    Non-medical: No  Tobacco Use  . Smoking status: Former Smoker    Last attempt to quit: 10/19/1985    Years since quitting: 32.7  . Smokeless tobacco: Never Used  Substance and Sexual Activity  . Alcohol use: Yes    Alcohol/week: 0.0 standard drinks    Comment: occasionally   . Drug use: No  . Sexual activity: Never  Lifestyle  . Physical activity:    Days per week: 0 days    Minutes per session: 0 min  .  Stress: Only a little  Relationships  . Social connections:    Talks on phone: More than three times a week    Gets together: More than three times a week    Attends religious service: More than 4 times per year    Active member of club or organization: Yes    Attends meetings of clubs or organizations: More than 4 times per year    Relationship status: Widowed  Other Topics Concern  . Not on file  Social History Narrative   Patient lives in a one story home.  Has 2 children.  Retired from [employer].    Family History  Problem Relation Age of Onset  . Arthritis Mother   . Heart disease Father   . Arthritis Other        Grandmother  . Diabetes Other        Grandmother    Review of Systems     Objective:   Vitals:   07/18/18 1327  BP: 120/72  Pulse: 90  Resp: 16  Temp: 98.1 F (36.7 C)  SpO2: 96%   BP Readings from Last 3 Encounters:  07/18/18 120/72  07/09/18 110/70  05/28/18 116/62   Wt Readings from Last 3 Encounters:  07/18/18 182 lb 12.8 oz (82.9 kg)  07/09/18 181 lb 2 oz (82.2 kg)  05/28/18 183 lb 10.3 oz (83.3 kg)   Body mass index is 33.43 kg/m.   Physical Exam    Constitutional: Appears well-developed and well-nourished. No distress.  HENT:  Head: Normocephalic and atraumatic.  Neck: Neck supple. No tracheal deviation present. No thyromegaly present.  No cervical lymphadenopathy. Right SCM tenderness and discomfort with movement Cardiovascular: Normal rate, regular rhythm and normal heart sounds.   No murmur heard. No carotid bruit .  No edema Pulmonary/Chest: Effort normal and breath sounds normal. No respiratory distress. No has no wheezes. No rales.  Skin: Skin is warm and dry. Not diaphoretic.  Psychiatric: Normal mood and affect. Behavior is normal.      Assessment & Plan:     See Problem List for Assessment and Plan of chronic medical problems.

## 2018-07-18 ENCOUNTER — Encounter: Payer: Self-pay | Admitting: Internal Medicine

## 2018-07-18 ENCOUNTER — Ambulatory Visit (INDEPENDENT_AMBULATORY_CARE_PROVIDER_SITE_OTHER): Payer: Medicare Other | Admitting: Internal Medicine

## 2018-07-18 ENCOUNTER — Other Ambulatory Visit (INDEPENDENT_AMBULATORY_CARE_PROVIDER_SITE_OTHER): Payer: Medicare Other

## 2018-07-18 VITALS — BP 120/72 | HR 90 | Temp 98.1°F | Resp 16 | Ht 62.0 in | Wt 182.8 lb

## 2018-07-18 DIAGNOSIS — E1140 Type 2 diabetes mellitus with diabetic neuropathy, unspecified: Secondary | ICD-10-CM

## 2018-07-18 DIAGNOSIS — I10 Essential (primary) hypertension: Secondary | ICD-10-CM | POA: Diagnosis not present

## 2018-07-18 DIAGNOSIS — K219 Gastro-esophageal reflux disease without esophagitis: Secondary | ICD-10-CM

## 2018-07-18 DIAGNOSIS — E1142 Type 2 diabetes mellitus with diabetic polyneuropathy: Secondary | ICD-10-CM

## 2018-07-18 DIAGNOSIS — M542 Cervicalgia: Secondary | ICD-10-CM

## 2018-07-18 LAB — COMPREHENSIVE METABOLIC PANEL
ALT: 22 U/L (ref 0–35)
AST: 27 U/L (ref 0–37)
Albumin: 4.6 g/dL (ref 3.5–5.2)
Alkaline Phosphatase: 61 U/L (ref 39–117)
BUN: 10 mg/dL (ref 6–23)
CALCIUM: 10.4 mg/dL (ref 8.4–10.5)
CO2: 33 mEq/L — ABNORMAL HIGH (ref 19–32)
Chloride: 89 mEq/L — ABNORMAL LOW (ref 96–112)
Creatinine, Ser: 0.74 mg/dL (ref 0.40–1.20)
GFR: 92.43 mL/min (ref >60.00)
Glucose, Bld: 203 mg/dL — ABNORMAL HIGH (ref 70–99)
Potassium: 3.2 mEq/L — ABNORMAL LOW (ref 3.5–5.1)
Sodium: 130 mEq/L — ABNORMAL LOW (ref 135–145)
Total Bilirubin: 0.3 mg/dL (ref 0.2–1.2)
Total Protein: 8 g/dL (ref 6.0–8.3)

## 2018-07-18 LAB — HEMOGLOBIN A1C: Hgb A1c MFr Bld: 8 % — ABNORMAL HIGH (ref 4.6–6.5)

## 2018-07-18 MED ORDER — PREGABALIN 50 MG PO CAPS: 50.0000 mg | ORAL_CAPSULE | Freq: Three times a day (TID) | ORAL | 0 refills | Status: DC

## 2018-07-18 MED ORDER — DAPAGLIFLOZIN PROPANEDIOL 5 MG PO TABS: 5.0000 mg | ORAL_TABLET | Freq: Every day | ORAL | 5 refills | Status: DC

## 2018-07-18 NOTE — Assessment & Plan Note (Signed)
GERD controlled Continue daily medication - neiuxm - reassured this is safe Continue pepcid

## 2018-07-18 NOTE — Assessment & Plan Note (Signed)
Check a1c, cmp If farxiga covered - will start 5 mg daily and titrate up if tolerated Continue metformin and glipizide

## 2018-07-18 NOTE — Assessment & Plan Note (Signed)
Right sided neck pain  - likely musculoskeletal in nature No lymphadenopathy Possibly cervical radiculopathy

## 2018-07-18 NOTE — Patient Instructions (Addendum)
Have blood work today.  Taper off gabapentin --   900mg     800 mg   800 mg  800 mg 800 mg   800 mg  800 mg   800 mg                 800 mg   800 mg   800mg    800mg           800 mg               800 mg                800mg   800mg           800 mg               800 mg                800mg   800mg                         800mg                         800mg    800mg                         800mg                         800mg    800mg                         800mg                         800mg    800 mg                      400 mg                       800 mg  400 mg                      400mg                         800mg   400 mg                      400mg                         800mg   400mg                       400 mg                        400 mg   400 mg                                                         400 mg  400  Mg  400 mg  400 mg once a day for two days and then stop  Once you are off the gabapentin then you can start the lyrica.  This can be increased if you tolerate it.    pregabalin (lyrica ) was sent to the pharmacy.   farxiga was sent to your pharmacy  - if covered stop the glipizide and start [place].      Follow up in 3 months

## 2018-07-18 NOTE — Assessment & Plan Note (Signed)
Gabapentin no longer effective Taper off medication slowly and then retry lyrica Continue over the counter topical creams

## 2018-07-19 ENCOUNTER — Other Ambulatory Visit: Payer: Self-pay | Admitting: Internal Medicine

## 2018-07-19 MED ORDER — POTASSIUM CHLORIDE CRYS ER 20 MEQ PO TBCR: 20.0000 meq | EXTENDED_RELEASE_TABLET | Freq: Three times a day (TID) | ORAL | 1 refills | Status: DC

## 2018-07-20 ENCOUNTER — Other Ambulatory Visit: Payer: Self-pay | Admitting: Internal Medicine

## 2018-07-20 NOTE — Telephone Encounter (Signed)
Is patient supposed to be taking jardiance with glipizide and metformin? She stated there was mention in her OV about stopping glipizide. Just double checking.

## 2018-07-20 NOTE — Telephone Encounter (Signed)
Should take all 3

## 2018-07-20 NOTE — Telephone Encounter (Signed)
PA for farxiga was denied. Pharmacy sent a request for jardiance instead which is pending. Please advise.

## 2018-07-23 NOTE — Telephone Encounter (Signed)
Pt aware of response below. Lyrica was not covered by insurance. Advised pt to call insurance to find out what would be covered for nerve pain. Pt agreed and will call back with info.

## 2018-08-06 ENCOUNTER — Other Ambulatory Visit: Payer: Self-pay | Admitting: Internal Medicine

## 2018-08-21 IMAGING — CT CT ABD-PELV W/ CM
2 of 5 series · 16 of 46 positions shown, 18 images · IV contrast (ISOVUE 300)
Comparison: 05/12/2014

CLINICAL DATA: Three 4 month history of intermittent urinary
incontinence. Left lower quadrant tenderness.

EXAM:
CT ABDOMEN AND PELVIS WITH CONTRAST
TECHNIQUE: Multidetector CT imaging of the abdomen and pelvis was performed
using the standard protocol following bolus administration of
intravenous contrast.
CONTRAST:  100mL HDAMYD-2HH IOPAMIDOL (HDAMYD-2HH) INJECTION 61%

[Series 2: abd/pel w · axial · 0.78mm/px · z∈[-421,-61]mm · 13 of 82 slices shown, 15 images]
[im 5/82  soft-tissue]
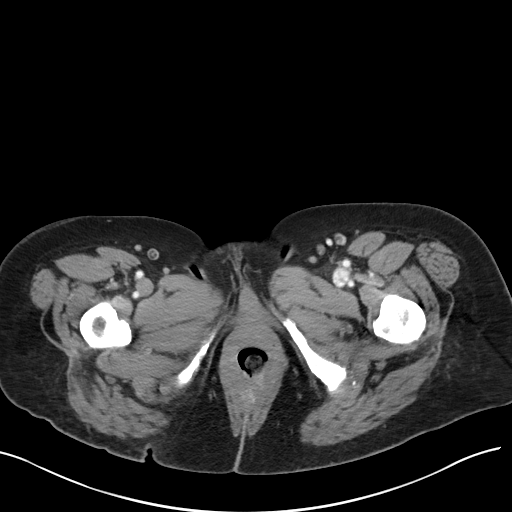
[im 5/82  bone]
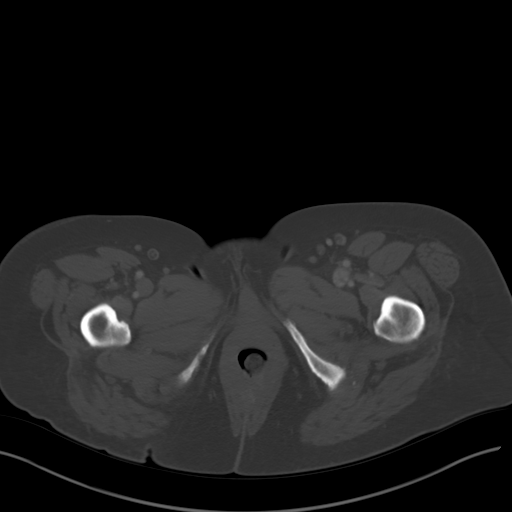
[im 13/82  soft-tissue]
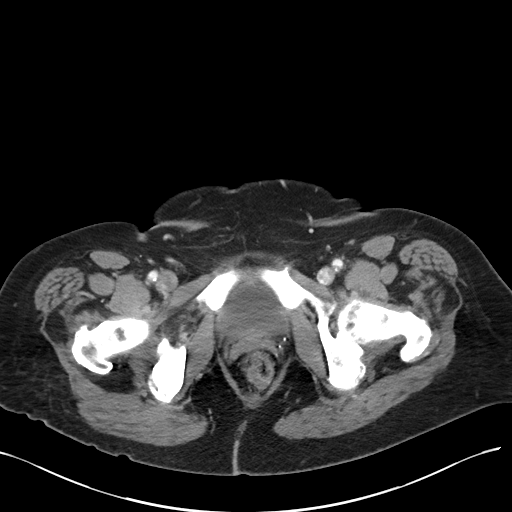
[im 17/82  soft-tissue]
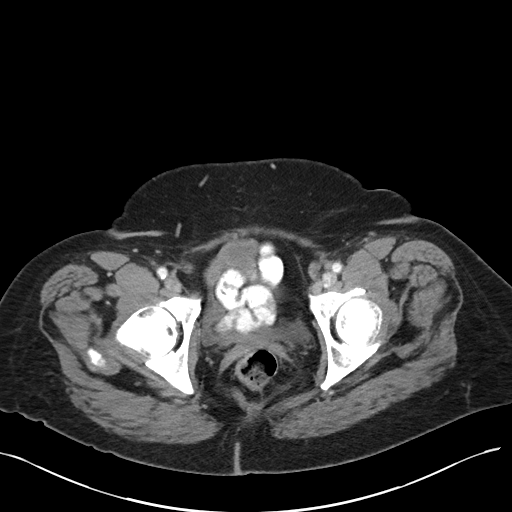
[im 25/82  soft-tissue]
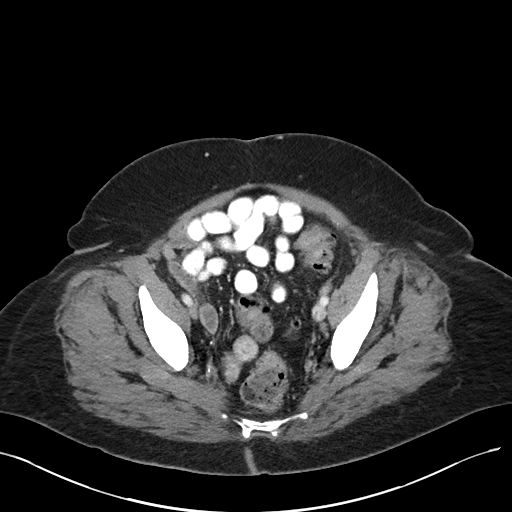
[im 29/82  soft-tissue]
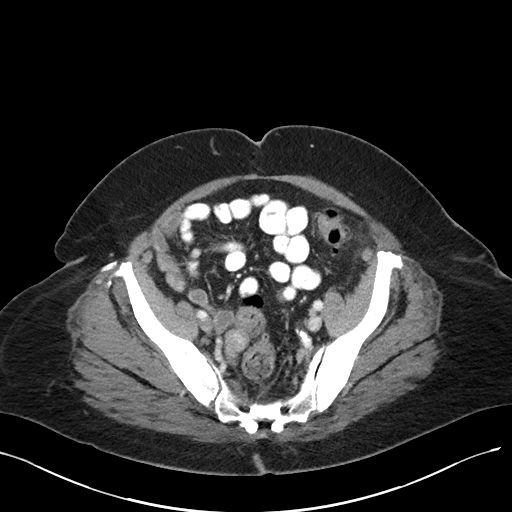
[im 37/82  soft-tissue]
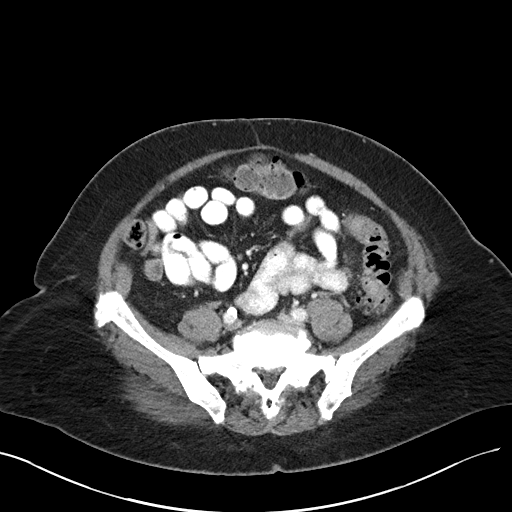
[im 41/82  soft-tissue]
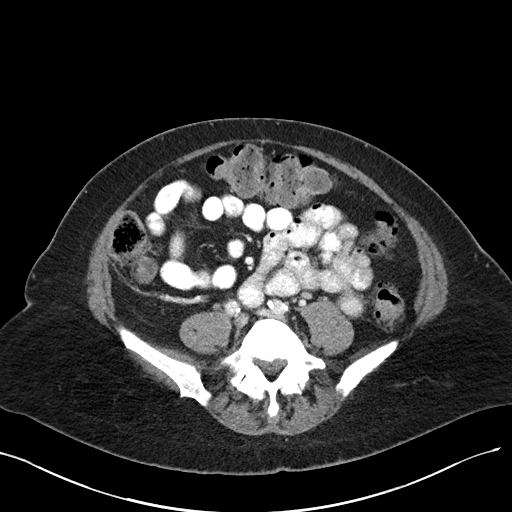
[im 45/82  soft-tissue]
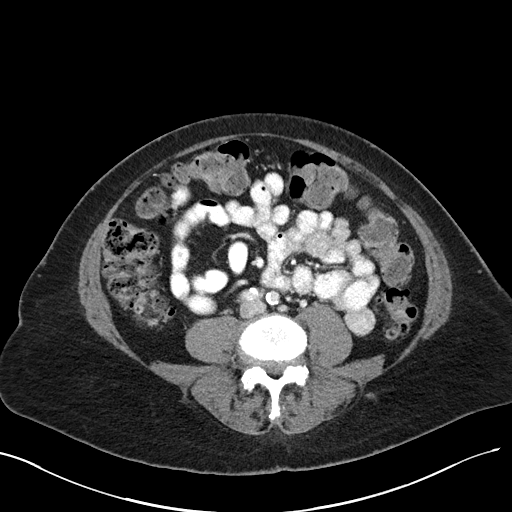
[im 53/82  soft-tissue]
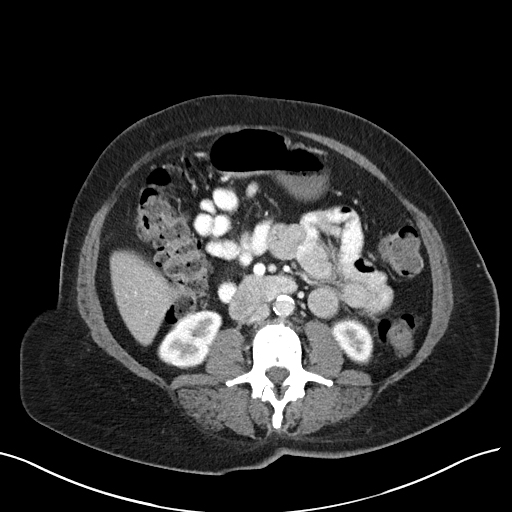
[im 53/82  bone]
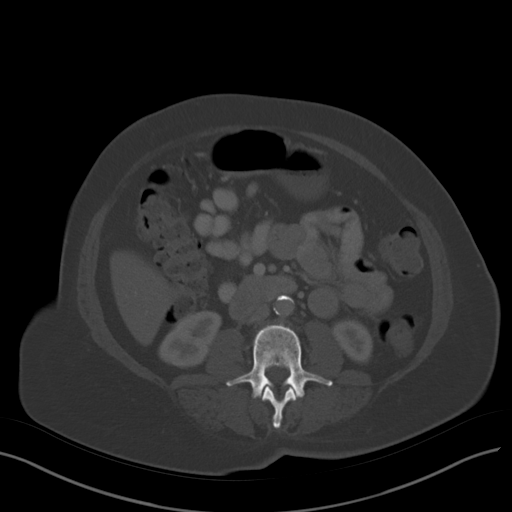
[im 57/82  soft-tissue]
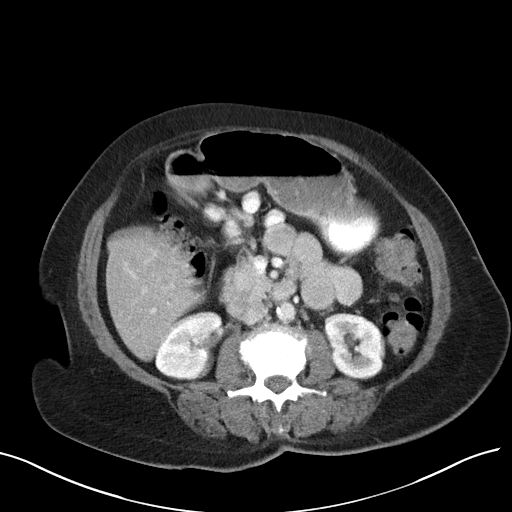
[im 65/82  soft-tissue]
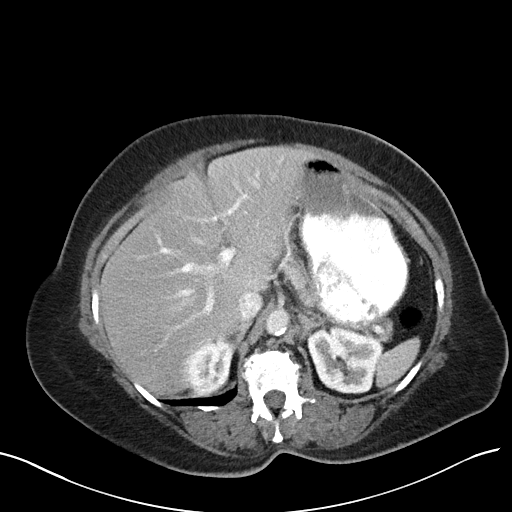
[im 69/82  soft-tissue]
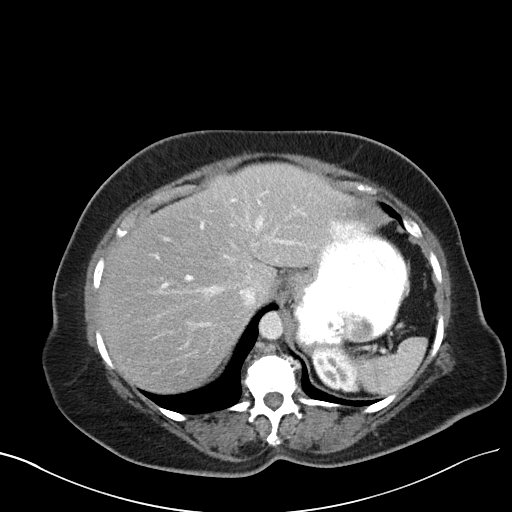
[im 77/82  soft-tissue]
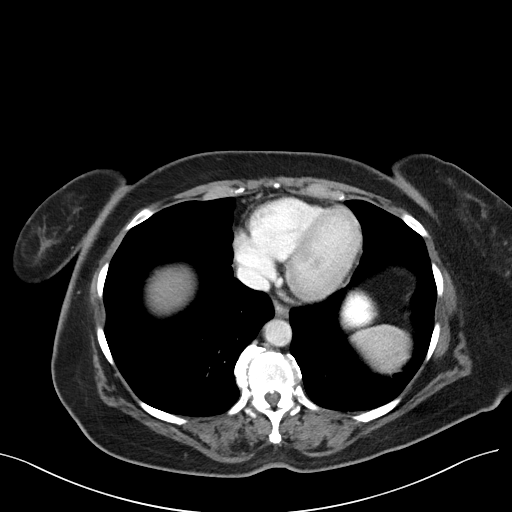

[Series 6: abd/pel w st · coronal · 0.65mm/px · 3 of 86 slices shown]
[im 29/86  soft-tissue]
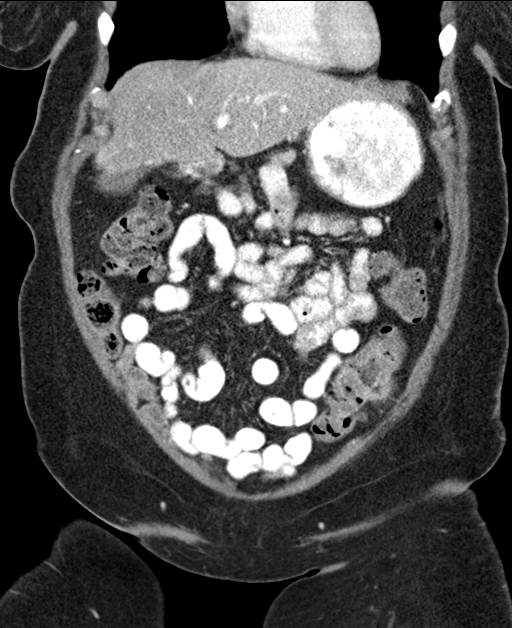
[im 38/86  soft-tissue]
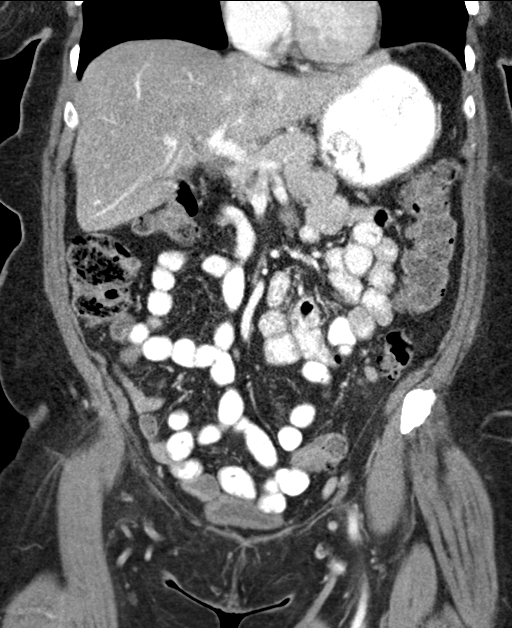
[im 48/86  soft-tissue]
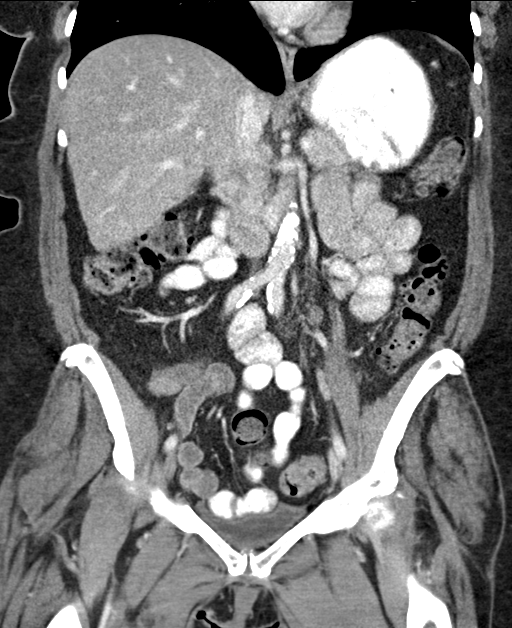

[16 of 46 positions shown; findings below may reference images not displayed]

FINDINGS: Lower chest:  Unremarkable.

Hepatobiliary: No focal abnormality within the liver parenchyma.
Gallbladder appears to be surgically absent. No intrahepatic or
extrahepatic biliary dilation.

Pancreas: No focal mass lesion. No dilatation of the main duct. No
intraparenchymal cyst. No peripancreatic edema.

Spleen: No splenomegaly. No focal mass lesion.

Adrenals/Urinary Tract: No adrenal nodule or mass. Kidneys
unremarkable. No evidence for hydroureter. The urinary bladder
appears normal for the degree of distention.

Stomach/Bowel: Stomach is nondistended. No gastric wall thickening.
No evidence of outlet obstruction. Duodenum is normally positioned
as is the ligament of Treitz. No small bowel wall thickening. No
small bowel dilatation. The terminal ileum is normal. The appendix
is not visualized, but there is no edema or inflammation in the
region of the cecum. Diverticuli are seen scattered along the entire
length of the colon without CT findings of diverticulitis.

Vascular/Lymphatic: There is abdominal aortic atherosclerosis
without aneurysm. There is no gastrohepatic or hepatoduodenal
ligament lymphadenopathy. No intraperitoneal or retroperitoneal
lymphadenopathy. No pelvic sidewall lymphadenopathy.

Reproductive: Uterus surgically absent.  There is no adnexal mass.

Other: No intraperitoneal free fluid.

Musculoskeletal: Pelvic floor laxity noted. Bone windows reveal no
worrisome lytic or sclerotic osseous lesions.
IMPRESSION: 1. No acute findings in the abdomen or pelvis.
2.  Aortic Atherosclerois ([id number]-170.0)
3. Pelvic floor laxity.

## 2018-08-30 ENCOUNTER — Other Ambulatory Visit: Payer: Self-pay | Admitting: Internal Medicine

## 2018-09-07 ENCOUNTER — Other Ambulatory Visit: Payer: Self-pay | Admitting: Neurology

## 2018-09-09 ENCOUNTER — Other Ambulatory Visit: Payer: Self-pay | Admitting: Internal Medicine

## 2018-09-10 NOTE — Progress Notes (Signed)
Subjective:    Patient ID: [name], female    DOB: 16-Apr-1941, 78 y.o.   MRN: [id number]  HPI The patient is here for an acute visit.   Neuropathy:  She is taking the gabapentin 400 mg 6 times a day.  It is not effective.  At her last visit we discussed her tapering off the medication and trying Lyrica, but it was not covered by her insurance, which we are not aware of.  Gabapentin has not been effective.  In the past she also tried Cymbalta, nortriptyline and Effexor and these were either not effective or she had side effects.  The pain has gotten worse and it is affecting her daily life.  She wonders what else can be done.  Diabetes:  She is taking her medication as prescribed.  Her sugars have been 120-140.  She needs to lose weight and knows how important that is.  Her sugars have been better controlled and she is taking her current medications as prescribed.  She is interested in trying 1 of the daily injections to help promote weight loss.  Constipation:   She is bloated and has increased gas.  She takes senokot as needed.   She took metamucil and miralax and they did not help.  linzess helped but it not covered.  She is unsure what else to do.    Medications and allergies reviewed with patient and updated if appropriate.  Patient Active Problem List   Diagnosis Date Noted  . CAD (coronary artery disease) 05/28/2018  . Abnormal CT scan, heart   . Atypical chest pain 05/26/2018  . Neck pain 07/21/2017  . Decreased hearing of right ear 02/01/2017  . Primary osteoarthritis of left knee 01/12/2017  . Constipation 01/10/2017  . Hypercalcemia 10/12/2016  . Chest pain 10/12/2016  . DOE (dyspnea on exertion) 10/12/2016  . Chronic back pain 08/23/2016  . Hair loss 08/23/2016  . Diabetes ([place]) 07/26/2016  . Anxiety 09/14/2015  . Muscle pain, myofacial 05/19/2015  . Diverticulitis large intestine w/o perforation or abscess w/o bleeding 05/12/2014  . Essential hypertension  05/12/2014  . Diabetic neuropathy ([place]) 05/12/2014  . Seizure disorder ([place]) 05/12/2014  . Hepatic steatosis 05/12/2014  . Obesity (BMI 30-39.9) 05/12/2014  . Cerebrovascular disease, unspecified 03/13/2013  . Allergic rhinitis 03/13/2013  . GOITER, MULTINODULAR 05/18/2009  . VERTIGO 04/13/2009  . Irritable bowel syndrome 03/20/2009  . INSOMNIA 03/20/2009  . Hyperlipidemia 03/18/2009  . GERD 03/18/2009    Current Outpatient Medications on File Prior to Visit  Medication Sig Dispense Refill  . ACCU-CHEK AVIVA PLUS test strip USE TO CHECK BLOOD SUGARS  TWO TIMES DAILY 200 each 2  . albuterol (PROAIR HFA) 108 (90 BASE) MCG/ACT inhaler Inhale 2 puffs into the lungs every 6 (six) hours as needed for wheezing or shortness of breath.     . Alpha-D-Galactosidase (BEANO PO) Take 1-2 tablets by mouth daily as needed (for gas).    Marland Kitchen aspirin EC 81 MG tablet Take 1 tablet (81 mg total) by mouth daily. 90 tablet 3  . azelastine (ASTELIN) 0.1 % nasal spray Place 2 sprays into both nostrils 2 (two) times daily as needed for rhinitis. Use in each nostril as directed    . clonazePAM (KLONOPIN) 0.5 MG tablet TAKE 1/2 TO 1 TABLET TWICE A DAY AS NEEDED FOR ANXIETY (Patient taking differently: Take 0.25-0.5 mg by mouth 2 (two) times daily as needed for anxiety. ) 60 tablet 0  . ezetimibe (ZETIA) 10  MG tablet TAKE 1 TABLET BY MOUTH EVERY DAY 90 tablet 1  . famotidine (PEPCID) 20 MG tablet TAKE 1 TABLET (20 MG TOTAL) BY MOUTH DAILY. (Patient taking differently: Take 20 mg by mouth daily. ) 90 tablet 1  . gabapentin (NEURONTIN) 800 MG tablet TAKE 1 TABLET BY MOUTH 3  TIMES DAILY (Patient taking differently: Take 800 mg by mouth 3 (three) times daily. ) 270 tablet 1  . hydrochlorothiazide (HYDRODIURIL) 25 MG tablet Take 1 tablet (25 mg total) by mouth daily. 90 tablet 1  . JARDIANCE 10 MG TABS tablet TAKE 1 TABLET BY MOUTH EVERY DAY 30 tablet 5  . metFORMIN (GLUCOPHAGE-XR) 500 MG 24 hr tablet TAKE 1 TABLET BY  MOUTH EVERY DAY WITH BREAKFAST 90 tablet 1  . Multiple Vitamin (MULTIVITAMIN) capsule Take 1 capsule by mouth daily.     . nitroGLYCERIN (NITROSTAT) 0.4 MG SL tablet Place 1 tablet (0.4 mg total) under the tongue every 5 (five) minutes as needed for chest pain. 25 tablet 2  . OXcarbazepine (TRILEPTAL) 150 MG tablet TAKE 1 TABLET BY MOUTH TWICE A DAY 180 tablet 1  . polyethylene glycol (MIRALAX / GLYCOLAX) packet Take 17 g by mouth daily. (Patient taking differently: Take 17 g by mouth as needed for moderate constipation. ) 14 each 0  . potassium chloride SA (KLOR-CON M20) 20 MEQ tablet Take 1 tablet (20 mEq total) by mouth 3 (three) times daily. 270 tablet 1  . pregabalin (LYRICA) 50 MG capsule Take 1 capsule (50 mg total) by mouth 3 (three) times daily. Start after you are off the gabapentin 90 capsule 0  . Probiotic Product (PROBIOTIC PO) Take 1 capsule by mouth daily after breakfast.     . rosuvastatin (CRESTOR) 5 MG tablet Take 1 tablet (5 mg total) by mouth daily. 30 tablet 5  . senna-docusate (SENOKOT-S) 8.6-50 MG tablet Take 2 tablets by mouth 2 (two) times daily. 60 tablet 0  . traMADol (ULTRAM) 50 MG tablet TAKE 1 TABLET EVERY 12 HOURS AS NEEDED 60 tablet 1   No current facility-administered medications on file prior to visit.     Past Medical History:  Diagnosis Date  . Allergic rhinitis, cause unspecified 03/13/2013  . Anxiety   . Cerebrovascular disease, unspecified 03/13/2013   Atrophy and small vessel dz noted, MR brain 2009  . Depression   . Diabetes mellitus, type 2 ([place])   . Diverticulosis of colon 03/2010 hosp  . Dyslipidemia   . GERD (gastroesophageal reflux disease)   . Hypertension   . Neuropathy   . OSA on CPAP   . Osteoarthritis of shoulder region    and Knee  . Seizure disorder ([place])    onset 11 years ago; repeated 2013    Past Surgical History:  Procedure Laterality Date  . ABDOMINAL HYSTERECTOMY  1970's   Partial  . APPENDECTOMY    . CHOLECYSTECTOMY     . LEFT HEART CATH AND CORONARY ANGIOGRAPHY N/A 05/28/2018   Procedure: LEFT HEART CATH AND CORONARY ANGIOGRAPHY;  Surgeon: Belva Crome, MD;  Location: [facility];  Service: Cardiovascular;  Laterality: N/A;  . LUMBAR EPIDURAL INJECTION Left 08/25/2017  . SHOULDER SURGERY  2008   LT, post fall   . TONSILLECTOMY AND ADENOIDECTOMY    . TOTAL KNEE ARTHROPLASTY Left 01/12/2017   Procedure: LEFT TOTAL KNEE ARTHROPLASTY;  Surgeon: Susa Day, MD;  Location: [facility];  Service: Orthopedics;  Laterality: Left;  120 mins    Social History  Socioeconomic History  . Marital status: Widowed    Spouse name: Not on file  . Number of children: 2  . Years of education: Not on file  . Highest education level: Not on file  Occupational History  . Not on file  Social Needs  . Financial resource strain: Not hard at all  . Food insecurity:    Worry: Never true    Inability: Never true  . Transportation needs:    Medical: No    Non-medical: No  Tobacco Use  . Smoking status: Former Smoker    Last attempt to quit: 10/19/1985    Years since quitting: 32.9  . Smokeless tobacco: Never Used  Substance and Sexual Activity  . Alcohol use: Yes    Alcohol/week: 0.0 standard drinks    Comment: occasionally   . Drug use: No  . Sexual activity: Never  Lifestyle  . Physical activity:    Days per week: 0 days    Minutes per session: 0 min  . Stress: Only a little  Relationships  . Social connections:    Talks on phone: More than three times a week    Gets together: More than three times a week    Attends religious service: More than 4 times per year    Active member of club or organization: Yes    Attends meetings of clubs or organizations: More than 4 times per year    Relationship status: Widowed  Other Topics Concern  . Not on file  Social History Narrative   Patient lives in a one story home.  Has 2 children.  Retired from [employer].    Family History  Problem Relation Age of Onset   . Arthritis Mother   . Heart disease Father   . Arthritis Other        Grandmother  . Diabetes Other        Grandmother    Review of Systems  Constitutional: Negative for fever.  Respiratory: Negative for shortness of breath.   Cardiovascular: Positive for chest pain (intermittent). Negative for palpitations and leg swelling.  Gastrointestinal: Positive for abdominal distention and constipation.  Neurological: Negative for headaches.       Objective:   Vitals:   09/11/18 1416  BP: 120/74  Pulse: 76  Resp: 16  Temp: 98.8 F (37.1 C)  SpO2: 98%   BP Readings from Last 3 Encounters:  09/11/18 120/74  07/18/18 120/72  07/09/18 110/70   Wt Readings from Last 3 Encounters:  09/11/18 178 lb (80.7 kg)  07/18/18 182 lb 12.8 oz (82.9 kg)  07/09/18 181 lb 2 oz (82.2 kg)   Body mass index is 32.56 kg/m.   Physical Exam    Constitutional: Appears well-developed and well-nourished. No distress.  HENT:  Head: Normocephalic and atraumatic.  Neck: Neck supple. No tracheal deviation present. No thyromegaly present.  No cervical lymphadenopathy Cardiovascular: Normal rate, regular rhythm and normal heart sounds.   No murmur heard. No carotid bruit .  No edema Pulmonary/Chest: Effort normal and breath sounds normal. No respiratory distress. No has no wheezes. No rales.  Skin: Skin is warm and dry. Not diaphoretic.  Psychiatric: Normal mood and affect. Behavior is normal.    Diabetic Foot Exam - Simple   Simple Foot Form Diabetic Foot exam was performed with the following findings:  Yes 09/11/2018  5:07 PM  Visual Inspection No deformities, no ulcerations, no other skin breakdown bilaterally:  Yes Sensation Testing See comments:  Yes Pulse  Check Posterior Tibialis and Dorsalis pulse intact bilaterally:  Yes Comments Mildly decreased sensation bilateral feet plantar surfaces to light touch       Assessment & Plan:    See Problem List for Assessment and Plan of  chronic medical problems.

## 2018-09-11 ENCOUNTER — Ambulatory Visit: Payer: Medicare Other | Admitting: Internal Medicine

## 2018-09-11 ENCOUNTER — Encounter: Payer: Self-pay | Admitting: Internal Medicine

## 2018-09-11 ENCOUNTER — Other Ambulatory Visit: Payer: Self-pay

## 2018-09-11 VITALS — BP 120/74 | HR 76 | Temp 98.8°F | Resp 16 | Ht 62.0 in | Wt 178.0 lb

## 2018-09-11 DIAGNOSIS — K5900 Constipation, unspecified: Secondary | ICD-10-CM

## 2018-09-11 DIAGNOSIS — E1142 Type 2 diabetes mellitus with diabetic polyneuropathy: Secondary | ICD-10-CM

## 2018-09-11 MED ORDER — SEMAGLUTIDE(0.25 OR 0.5MG/DOS) 2 MG/1.5ML ~~LOC~~ SOPN: 0.2500 mg | PEN_INJECTOR | SUBCUTANEOUS | 5 refills | Status: DC

## 2018-09-11 MED ORDER — LUBIPROSTONE 24 MCG PO CAPS: 24.0000 ug | ORAL_CAPSULE | Freq: Two times a day (BID) | ORAL | 5 refills | Status: DC

## 2018-09-11 MED ORDER — PREGABALIN 50 MG PO CAPS: 50.0000 mg | ORAL_CAPSULE | Freq: Three times a day (TID) | ORAL | 0 refills | Status: DC

## 2018-09-11 NOTE — Assessment & Plan Note (Signed)
Gabapentin not effective Did not tolerate Cymbalta or Effexor Nortriptyline not effective Lyrica not covered  We will send Lyrica back to the pharmacy-we need to know if we can do a prior authorization to get this covered because she has no other options Gabapentin is not effective and she will taper off of this slowly as discussed at her last visit We will see if we can get Lyrica improved and see if that is effective

## 2018-09-11 NOTE — Assessment & Plan Note (Signed)
Sugars controlled at home Continue Jardiance 10 mg daily Will start Ozempic to help with weight loss.  Discussed that this may cause nausea initially Will discontinue glipizide Encourage regular exercise and diabetic diet Follow-up in 2 months, sooner if needed-we will recheck A1c at that time

## 2018-09-11 NOTE — Assessment & Plan Note (Signed)
Chronic constipation Linzess worked well, but is not covered by her insurance Taking Senokot, which helps Metamucil and MiraLAX not effective Will struggling with constipation We will try [name]-sent to pharmacy Follow-up in 2 months

## 2018-09-11 NOTE — Patient Instructions (Addendum)
    Medications reviewed and updated.  Changes include :   Start [name] daily for your constipation  Slowly taper off the gabapentin Lyrica was sent to the pharmacy  Stop the glipizide for your sugar Start the [place] for your sugars     Your prescription(s) have been submitted to your pharmacy. Please take as directed and contact our office if you believe you are having problem(s) with the medication(s).   Please followup in 2 months

## 2018-09-25 ENCOUNTER — Other Ambulatory Visit: Payer: Self-pay | Admitting: Internal Medicine

## 2018-09-25 ENCOUNTER — Telehealth: Payer: Self-pay | Admitting: *Deleted

## 2018-09-25 NOTE — Telephone Encounter (Signed)
Called patient and LVM to inform them the nurse needs to either convert their upcoming AWV to a virtual visit or reschedule the visit out into the future due to covid-19 safety measures. Nurse requested that the patient call-back and stated she would call them back at a later date.    

## 2018-10-15 ENCOUNTER — Telehealth: Payer: Self-pay | Admitting: *Deleted

## 2018-10-15 NOTE — Telephone Encounter (Signed)
Called patient and LVM to inform them the nurse needs to either convert their upcoming AWV to a virtual visit or reschedule the visit out into the future due to covid-19 safety measures. Nurse requested that the patient call-back and stated she would call them back at a later date.    

## 2018-10-17 ENCOUNTER — Ambulatory Visit: Payer: Medicare Other

## 2018-11-01 LAB — HM DIABETES EYE EXAM

## 2018-11-04 NOTE — Progress Notes (Signed)
Subjective:    Patient ID: [name], female    DOB: 06-Jul-1940, 78 y.o.   MRN: [id number]  HPI The patient is here for follow up.  She is exercising a little.     Chronic constipation:  She is taking [name] twice daily.  She is still constipated.  She is taking a stool softener she thinks.  In the past Metamucil and MiraLAX have not been effective and she is not currently taking them.  Linzess was effective, but is not currently covered by her insurance.  She states when she does go to the bathroom she does not have a good bowel movement and it is "small pebbles".  She will have some abdominal discomfort and nausea with the constipation.  Diabetes: two months ago we started ozempic 2 months ago.  She gets transient chills after taking the ozempic.  She denies other side effects.  She is taking all of her other medication.   She is compliant with a diabetic diet.  She monitors her sugars and they have been running 124, 123, 130.   Diabetic neuropathy:  We tapered her off the gabapentin and started lyrica.  She is taking the lyrica 3 times a day.  She thinks her neuropathy pain may be slightly better, but states it could be better controlled than it is now.     Medications and allergies reviewed with patient and updated if appropriate.  Patient Active Problem List   Diagnosis Date Noted  . CAD (coronary artery disease) 05/28/2018  . Abnormal CT scan, heart   . Atypical chest pain 05/26/2018  . Neck pain 07/21/2017  . Decreased hearing of right ear 02/01/2017  . Primary osteoarthritis of left knee 01/12/2017  . Constipation 01/10/2017  . Hypercalcemia 10/12/2016  . Chest pain 10/12/2016  . DOE (dyspnea on exertion) 10/12/2016  . Chronic back pain 08/23/2016  . Hair loss 08/23/2016  . Diabetes ([place]) 07/26/2016  . Anxiety 09/14/2015  . Muscle pain, myofacial 05/19/2015  . Diverticulitis large intestine w/o perforation or abscess w/o bleeding 05/12/2014  . Essential  hypertension 05/12/2014  . Diabetic neuropathy ([place]) 05/12/2014  . Seizure disorder ([place]) 05/12/2014  . Hepatic steatosis 05/12/2014  . Obesity (BMI 30-39.9) 05/12/2014  . Cerebrovascular disease, unspecified 03/13/2013  . Allergic rhinitis 03/13/2013  . GOITER, MULTINODULAR 05/18/2009  . VERTIGO 04/13/2009  . Irritable bowel syndrome 03/20/2009  . INSOMNIA 03/20/2009  . Hyperlipidemia 03/18/2009  . GERD 03/18/2009    Current Outpatient Medications on File Prior to Visit  Medication Sig Dispense Refill  . ACCU-CHEK AVIVA PLUS test strip USE TO CHECK BLOOD SUGARS  TWO TIMES DAILY 200 each 2  . albuterol (PROAIR HFA) 108 (90 BASE) MCG/ACT inhaler Inhale 2 puffs into the lungs every 6 (six) hours as needed for wheezing or shortness of breath.     . Alpha-D-Galactosidase (BEANO PO) Take 1-2 tablets by mouth daily as needed (for gas).    Marland Kitchen aspirin EC 81 MG tablet Take 1 tablet (81 mg total) by mouth daily. 90 tablet 3  . azelastine (ASTELIN) 0.1 % nasal spray Place 2 sprays into both nostrils 2 (two) times daily as needed for rhinitis. Use in each nostril as directed    . clonazePAM (KLONOPIN) 0.5 MG tablet TAKE 1/2 TO 1 TABLET TWICE A DAY AS NEEDED FOR ANXIETY (Patient taking differently: Take 0.25-0.5 mg by mouth 2 (two) times daily as needed for anxiety. ) 60 tablet 0  . ezetimibe (ZETIA) 10 MG tablet  TAKE 1 TABLET BY MOUTH EVERY DAY 90 tablet 1  . famotidine (PEPCID) 20 MG tablet TAKE 1 TABLET (20 MG TOTAL) BY MOUTH DAILY. (Patient taking differently: Take 20 mg by mouth daily. ) 90 tablet 1  . hydrochlorothiazide (HYDRODIURIL) 25 MG tablet TAKE 1 TABLET BY MOUTH EVERY DAY 90 tablet 1  . JARDIANCE 10 MG TABS tablet TAKE 1 TABLET BY MOUTH EVERY DAY 30 tablet 5  . lubiprostone ([name]) 24 MCG capsule Take 1 capsule (24 mcg total) by mouth 2 (two) times daily with a meal. 30 capsule 5  . metFORMIN (GLUCOPHAGE-XR) 500 MG 24 hr tablet TAKE 1 TABLET BY MOUTH EVERY DAY WITH BREAKFAST 90 tablet 1   . Multiple Vitamin (MULTIVITAMIN) capsule Take 1 capsule by mouth daily.     . nitroGLYCERIN (NITROSTAT) 0.4 MG SL tablet Place 1 tablet (0.4 mg total) under the tongue every 5 (five) minutes as needed for chest pain. 25 tablet 2  . OXcarbazepine (TRILEPTAL) 150 MG tablet TAKE 1 TABLET BY MOUTH TWICE A DAY 180 tablet 1  . polyethylene glycol (MIRALAX / GLYCOLAX) packet Take 17 g by mouth daily. (Patient taking differently: Take 17 g by mouth as needed for moderate constipation. ) 14 each 0  . potassium chloride SA (KLOR-CON M20) 20 MEQ tablet Take 1 tablet (20 mEq total) by mouth 3 (three) times daily. 270 tablet 1  . Probiotic Product (PROBIOTIC PO) Take 1 capsule by mouth daily after breakfast.     . rosuvastatin (CRESTOR) 5 MG tablet Take 1 tablet (5 mg total) by mouth daily. 30 tablet 5  . Semaglutide,0.25 or 0.5MG /DOS, (OZEMPIC, 0.25 OR 0.5 MG/DOSE,) 2 MG/1.5ML SOPN Inject 0.25 mg into the skin once a week. For four weeks, then increase to 0.5 mg weekly 1 pen 5  . senna-docusate (SENOKOT-S) 8.6-50 MG tablet Take 2 tablets by mouth 2 (two) times daily. 60 tablet 0  . traMADol (ULTRAM) 50 MG tablet TAKE 1 TABLET EVERY 12 HOURS AS NEEDED 60 tablet 1  . [DISCONTINUED] pregabalin (LYRICA) 50 MG capsule Take 1 capsule (50 mg total) by mouth 3 (three) times daily. Start after you are off the gabapentin 90 capsule 0   No current facility-administered medications on file prior to visit.     Past Medical History:  Diagnosis Date  . Allergic rhinitis, cause unspecified 03/13/2013  . Anxiety   . Cerebrovascular disease, unspecified 03/13/2013   Atrophy and small vessel dz noted, MR brain 2009  . Depression   . Diabetes mellitus, type 2 ([place])   . Diverticulosis of colon 03/2010 hosp  . Dyslipidemia   . GERD (gastroesophageal reflux disease)   . Hypertension   . Neuropathy   . OSA on CPAP   . Osteoarthritis of shoulder region    and Knee  . Seizure disorder ([place])    onset 11 years ago;  repeated 2013    Past Surgical History:  Procedure Laterality Date  . ABDOMINAL HYSTERECTOMY  1970's   Partial  . APPENDECTOMY    . CHOLECYSTECTOMY    . LEFT HEART CATH AND CORONARY ANGIOGRAPHY N/A 05/28/2018   Procedure: LEFT HEART CATH AND CORONARY ANGIOGRAPHY;  Surgeon: Belva Crome, MD;  Location: [facility];  Service: Cardiovascular;  Laterality: N/A;  . LUMBAR EPIDURAL INJECTION Left 08/25/2017  . SHOULDER SURGERY  2008   LT, post fall   . TONSILLECTOMY AND ADENOIDECTOMY    . TOTAL KNEE ARTHROPLASTY Left 01/12/2017   Procedure: LEFT TOTAL KNEE ARTHROPLASTY;  Surgeon: Susa Day, MD;  Location: [facility];  Service: Orthopedics;  Laterality: Left;  120 mins    Social History   Socioeconomic History  . Marital status: Widowed    Spouse name: Not on file  . Number of children: 2  . Years of education: Not on file  . Highest education level: Not on file  Occupational History  . Not on file  Social Needs  . Financial resource strain: Not hard at all  . Food insecurity:    Worry: Never true    Inability: Never true  . Transportation needs:    Medical: No    Non-medical: No  Tobacco Use  . Smoking status: Former Smoker    Last attempt to quit: 10/19/1985    Years since quitting: 33.0  . Smokeless tobacco: Never Used  Substance and Sexual Activity  . Alcohol use: Yes    Alcohol/week: 0.0 standard drinks    Comment: occasionally   . Drug use: No  . Sexual activity: Never  Lifestyle  . Physical activity:    Days per week: 0 days    Minutes per session: 0 min  . Stress: Only a little  Relationships  . Social connections:    Talks on phone: More than three times a week    Gets together: More than three times a week    Attends religious service: More than 4 times per year    Active member of club or organization: Yes    Attends meetings of clubs or organizations: More than 4 times per year    Relationship status: Widowed  Other Topics Concern  . Not on  file  Social History Narrative   Patient lives in a one story home.  Has 2 children.  Retired from [employer].    Family History  Problem Relation Age of Onset  . Arthritis Mother   . Heart disease Father   . Arthritis Other        Grandmother  . Diabetes Other        Grandmother    Review of Systems  Constitutional: Positive for chills. Negative for fever.  Respiratory: Positive for shortness of breath (on occasion).   Cardiovascular: Negative for chest pain and palpitations.  Gastrointestinal: Positive for abdominal pain (with constipation), constipation and nausea (with constipation).  Neurological: Positive for headaches. Negative for light-headedness.       Objective:   Vitals:   11/05/18 1056  BP: 130/72  Pulse: 81  Resp: 16  Temp: 97.9 F (36.6 C)  SpO2: 97%   BP Readings from Last 3 Encounters:  11/05/18 130/72  09/11/18 120/74  07/18/18 120/72   Wt Readings from Last 3 Encounters:  11/05/18 164 lb 1.9 oz (74.4 kg)  09/11/18 178 lb (80.7 kg)  07/18/18 182 lb 12.8 oz (82.9 kg)   Body mass index is 30.02 kg/m.   Physical Exam    Constitutional: Appears well-developed and well-nourished. No distress.  HENT:  Head: Normocephalic and atraumatic.  Neck: Neck supple. No tracheal deviation present. No thyromegaly present.  No cervical lymphadenopathy Cardiovascular: Normal rate, regular rhythm and normal heart sounds.   No murmur heard. No carotid bruit .  No edema Pulmonary/Chest: Effort normal and breath sounds normal. No respiratory distress. No has no wheezes. No rales.  Skin: Skin is warm and dry. Not diaphoretic.  Psychiatric: Normal mood and affect. Behavior is normal.      Assessment & Plan:    See Problem List for Assessment and  Plan of chronic medical problems.

## 2018-11-05 ENCOUNTER — Other Ambulatory Visit: Payer: Self-pay

## 2018-11-05 ENCOUNTER — Encounter: Payer: Self-pay | Admitting: Internal Medicine

## 2018-11-05 ENCOUNTER — Ambulatory Visit (INDEPENDENT_AMBULATORY_CARE_PROVIDER_SITE_OTHER): Payer: Medicare Other | Admitting: Internal Medicine

## 2018-11-05 ENCOUNTER — Ambulatory Visit (INDEPENDENT_AMBULATORY_CARE_PROVIDER_SITE_OTHER): Payer: Medicare Other | Admitting: *Deleted

## 2018-11-05 ENCOUNTER — Other Ambulatory Visit (INDEPENDENT_AMBULATORY_CARE_PROVIDER_SITE_OTHER): Payer: Medicare Other

## 2018-11-05 VITALS — BP 130/72 | HR 81 | Resp 16 | Ht 62.0 in | Wt 164.0 lb

## 2018-11-05 VITALS — BP 130/72 | HR 81 | Temp 97.9°F | Resp 16 | Ht 62.0 in | Wt 164.1 lb

## 2018-11-05 DIAGNOSIS — K5900 Constipation, unspecified: Secondary | ICD-10-CM | POA: Diagnosis not present

## 2018-11-05 DIAGNOSIS — E1142 Type 2 diabetes mellitus with diabetic polyneuropathy: Secondary | ICD-10-CM | POA: Diagnosis not present

## 2018-11-05 DIAGNOSIS — Z0000 Encounter for general adult medical examination without abnormal findings: Secondary | ICD-10-CM

## 2018-11-05 LAB — COMPREHENSIVE METABOLIC PANEL
ALT: 16 U/L (ref 0–35)
AST: 22 U/L (ref 0–37)
Albumin: 4.8 g/dL (ref 3.5–5.2)
Alkaline Phosphatase: 57 U/L (ref 39–117)
BUN: 11 mg/dL (ref 6–23)
CO2: 30 mEq/L (ref 19–32)
Calcium: 11.1 mg/dL — ABNORMAL HIGH (ref 8.4–10.5)
Chloride: 93 mEq/L — ABNORMAL LOW (ref 96–112)
Creatinine, Ser: 0.88 mg/dL (ref 0.40–1.20)
GFR: 75.62 mL/min (ref >60.00)
Glucose, Bld: 107 mg/dL — ABNORMAL HIGH (ref 70–99)
Potassium: 3.3 mEq/L — ABNORMAL LOW (ref 3.5–5.1)
Sodium: 135 mEq/L (ref 135–145)
Total Bilirubin: 0.3 mg/dL (ref 0.2–1.2)
Total Protein: 8.3 g/dL (ref 6.0–8.3)

## 2018-11-05 LAB — HEMOGLOBIN A1C: Hgb A1c MFr Bld: 7.7 % — ABNORMAL HIGH (ref 4.6–6.5)

## 2018-11-05 MED ORDER — SEMAGLUTIDE(0.25 OR 0.5MG/DOS) 2 MG/1.5ML ~~LOC~~ SOPN: 0.5000 mg | PEN_INJECTOR | SUBCUTANEOUS | 5 refills | Status: DC

## 2018-11-05 MED ORDER — PREGABALIN 75 MG PO CAPS: 75.0000 mg | ORAL_CAPSULE | Freq: Three times a day (TID) | ORAL | 5 refills | Status: DC

## 2018-11-05 NOTE — Assessment & Plan Note (Addendum)
amitiza is not effective Taking stool softener Has tried miralax and metamucil in the past - not effective linzess - effective --- but not covered For now continue [name]  Will refer to GI for further evaluation/treatment

## 2018-11-05 NOTE — Patient Instructions (Addendum)
Bring a copy of your living will and/or healthcare power of attorney to your next office visit.  Continue to eat heart healthy diet (full of fruits, vegetables, whole grains, lean protein, water--limit salt, fat, and sugar intake) and increase physical activity as tolerated.   [name] , Thank you for taking time to come for your Medicare Wellness Visit. I appreciate your ongoing commitment to your health goals. Please review the following plan we discussed and let me know if I can assist you in the future.   These are the goals we discussed: Goals    . Patient Stated     Continue to lose weight, [facility] water aerobic, eat healthy and low sugar and carbohydrates. Enjoy life, family, friends and worship God.    . Patient Stated       This is a list of the screening recommended for you and due dates:  Health Maintenance  Topic Date Due  . Eye exam for diabetics  07/25/2015  . Hemoglobin A1C  01/16/2019  . Flu Shot  01/26/2019  . Urine Protein Check  02/08/2019  . Colon Cancer Screening  05/08/2019  . Complete foot exam   09/11/2019  . Tetanus Vaccine  05/02/2022  . DEXA scan (bone density measurement)  Completed  . Pneumonia vaccines  Completed    [facility] in [place], [place] in: [place] Address: [address], [place], [place] [address] Phone: [phone]   [facility] : [facility] and [place] Speech pathologist in [place], [place] Address: [address] [place], [place] [address] Phone: [phone]'   Preventive Care 21 Years and Older, Female Preventive care refers to lifestyle choices and visits with your health care provider that can promote health and wellness. What does preventive care include?  A yearly physical exam. This is also called an annual well check.  Dental exams once or twice a year.  Routine eye exams. Ask your health care provider how often you should have your eyes  checked.  Personal lifestyle choices, including: ? Daily care of your teeth and gums. ? Regular physical activity. ? Eating a healthy diet. ? Avoiding tobacco and drug use. ? Limiting alcohol use. ? Practicing safe sex. ? Taking low-dose aspirin every day. ? Taking vitamin and mineral supplements as recommended by your health care provider. What happens during an annual well check? The services and screenings done by your health care provider during your annual well check will depend on your age, overall health, lifestyle risk factors, and family history of disease. Counseling Your health care provider may ask you questions about your:  Alcohol use.  Tobacco use.  Drug use.  Emotional well-being.  Home and relationship well-being.  Sexual activity.  Eating habits.  History of falls.  Memory and ability to understand (cognition).  Work and work Statistician.  Reproductive health.  Screening You may have the following tests or measurements:  Height, weight, and BMI.  Blood pressure.  Lipid and cholesterol levels. These may be checked every 5 years, or more frequently if you are over 55 years old.  Skin check.  Lung cancer screening. You may have this screening every year starting at age 81 if you have a 30-pack-year history of smoking and currently smoke or have quit within the past 15 years.  Colorectal cancer screening. All adults should have this screening starting at age 30 and continuing until age 4. You will have tests every 1-10 years, depending on your results  and the type of screening test. People at increased risk should start screening at an earlier age. Screening tests may include: ? Guaiac-based fecal occult blood testing. ? Fecal immunochemical test (FIT). ? Stool DNA test. ? Virtual colonoscopy. ? Sigmoidoscopy. During this test, a flexible tube with a tiny camera (sigmoidoscope) is used to examine your rectum and lower colon. The sigmoidoscope is  inserted through your anus into your rectum and lower colon. ? Colonoscopy. During this test, a long, thin, flexible tube with a tiny camera (colonoscope) is used to examine your entire colon and rectum.  Hepatitis C blood test.  Hepatitis B blood test.  Sexually transmitted disease (STD) testing.  Diabetes screening. This is done by checking your blood sugar (glucose) after you have not eaten for a while (fasting). You may have this done every 1-3 years.  Bone density scan. This is done to screen for osteoporosis. You may have this done starting at age 20.  Mammogram. This may be done every 1-2 years. Talk to your health care provider about how often you should have regular mammograms. Talk with your health care provider about your test results, treatment options, and if necessary, the need for more tests. Vaccines Your health care provider may recommend certain vaccines, such as:  Influenza vaccine. This is recommended every year.  Tetanus, diphtheria, and acellular pertussis (Tdap, Td) vaccine. You may need a Td booster every 10 years.  Varicella vaccine. You may need this if you have not been vaccinated.  Zoster vaccine. You may need this after age 17.  Measles, mumps, and rubella (MMR) vaccine. You may need at least one dose of MMR if you were born in 1957 or later. You may also need a second dose.  Pneumococcal 13-valent conjugate (PCV13) vaccine. One dose is recommended after age 26.  Pneumococcal polysaccharide (PPSV23) vaccine. One dose is recommended after age 73.  Meningococcal vaccine. You may need this if you have certain conditions.  Hepatitis A vaccine. You may need this if you have certain conditions or if you travel or work in places where you may be exposed to hepatitis A.  Hepatitis B vaccine. You may need this if you have certain conditions or if you travel or work in places where you may be exposed to hepatitis B.  Haemophilus influenzae type b (Hib)  vaccine. You may need this if you have certain conditions. Talk to your health care provider about which screenings and vaccines you need and how often you need them. This information is not intended to replace advice given to you by your health care provider. Make sure you discuss any questions you have with your health care provider. Document Released: 07/10/2015 Document Revised: 08/03/2017 Document Reviewed: 04/14/2015 Elsevier Interactive Patient Education  2019 [employer].

## 2018-11-05 NOTE — Patient Instructions (Addendum)
Have blood work done today.   Tests ordered today. Your results will be released to [place] (or called to you) after review, usually within 72hours after test completion. If any changes need to be made, you will be notified at that same time.   Medications reviewed and updated.  Changes include :   Increase pregabalin (lyrica) to 75 mg three times a day.  Continue all your other medications.   Your prescription(s) have been submitted to your pharmacy. Please take as directed and contact our office if you believe you are having problem(s) with the medication(s).  A referral was ordered for GI   Please followup in 3 months

## 2018-11-05 NOTE — Progress Notes (Addendum)
Subjective:   [name] is a 78 y.o. female who presents for Medicare Annual (Subsequent) preventive examination.  Review of Systems:  No ROS.  Cardiac Risk Factors include: advanced age (>37men, >32 women);diabetes mellitus;dyslipidemia;hypertension Sleep patterns: feels rested on waking, gets up 3 times nightly to void and sleeps 6-8 hours nightly.    Home Safety/Smoke Alarms: Feels safe in home. Smoke alarms in place.  Living environment; residence and Firearm Safety: 1-story house/ trailer, equipment: Radio producer, Type: [place]. Lives alone, no needs for DME, good support systemSeat Belt Safety/Bike Helmet: Wears seat belt.      Objective:     Vitals: BP 130/72   Pulse 81   Resp 16   Ht 5\' 2"  (1.575 m)   Wt 164 lb (74.4 kg)   SpO2 97%   BMI 30.00 kg/m   Body mass index is 30 kg/m.  Advanced Directives 11/05/2018 05/26/2018 05/26/2018 04/14/2018 10/11/2017 01/12/2017 01/03/2017  Does Patient Have a Medical Advance Directive? Yes No No No Yes No No  Type of Paramedic of [place];Living will - - - [place];Living will - -  Copy of [place] in Chart? No - copy requested - - - No - copy requested - -  Would patient like information on creating a medical advance directive? - Yes (Inpatient - patient requests chaplain consult to create a medical advance directive) No - Patient declined - - No - Patient declined No - Patient declined    Tobacco Social History   Tobacco Use  Smoking Status Former Smoker  . Last attempt to quit: 10/19/1985  . Years since quitting: 33.0  Smokeless Tobacco Never Used     Counseling given: Not Answered  Past Medical History:  Diagnosis Date  . Allergic rhinitis, cause unspecified 03/13/2013  . Anxiety   . Cerebrovascular disease, unspecified 03/13/2013   Atrophy and small vessel dz noted, MR brain 2009  . Depression   . Diabetes mellitus, type 2 ([place])   . Diverticulosis of  colon 03/2010 hosp  . Dyslipidemia   . GERD (gastroesophageal reflux disease)   . Hypertension   . Neuropathy   . OSA on CPAP   . Osteoarthritis of shoulder region    and Knee  . Seizure disorder ([place])    onset 11 years ago; repeated 2013   Past Surgical History:  Procedure Laterality Date  . ABDOMINAL HYSTERECTOMY  1970's   Partial  . APPENDECTOMY    . CHOLECYSTECTOMY    . LEFT HEART CATH AND CORONARY ANGIOGRAPHY N/A 05/28/2018   Procedure: LEFT HEART CATH AND CORONARY ANGIOGRAPHY;  Surgeon: Belva Crome, MD;  Location: [facility];  Service: Cardiovascular;  Laterality: N/A;  . LUMBAR EPIDURAL INJECTION Left 08/25/2017  . SHOULDER SURGERY  2008   LT, post fall   . TONSILLECTOMY AND ADENOIDECTOMY    . TOTAL KNEE ARTHROPLASTY Left 01/12/2017   Procedure: LEFT TOTAL KNEE ARTHROPLASTY;  Surgeon: Susa Day, MD;  Location: [facility];  Service: Orthopedics;  Laterality: Left;  120 mins   Family History  Problem Relation Age of Onset  . Arthritis Mother   . Heart disease Father   . Arthritis Other        Grandmother  . Diabetes Other        Grandmother   Social History   Socioeconomic History  . Marital status: Widowed    Spouse name: Not on file  . Number of children: 2  .  Years of education: Not on file  . Highest education level: Not on file  Occupational History  . Occupation: retired  Scientific laboratory technician  . Financial resource strain: Not hard at all  . Food insecurity:    Worry: Never true    Inability: Never true  . Transportation needs:    Medical: No    Non-medical: No  Tobacco Use  . Smoking status: Former Smoker    Last attempt to quit: 10/19/1985    Years since quitting: 33.0  . Smokeless tobacco: Never Used  Substance and Sexual Activity  . Alcohol use: Yes    Alcohol/week: 0.0 standard drinks    Comment: occasionally   . Drug use: No  . Sexual activity: Never  Lifestyle  . Physical activity:    Days per week: 0 days    Minutes per session: 0  min  . Stress: Only a little  Relationships  . Social connections:    Talks on phone: More than three times a week    Gets together: More than three times a week    Attends religious service: More than 4 times per year    Active member of club or organization: Yes    Attends meetings of clubs or organizations: More than 4 times per year    Relationship status: Widowed  Other Topics Concern  . Not on file  Social History Narrative   Patient lives in a one story home.  Has 2 children.  Retired from [employer].    Outpatient Encounter Medications as of 11/05/2018  Medication Sig  . ACCU-CHEK AVIVA PLUS test strip USE TO CHECK BLOOD SUGARS  TWO TIMES DAILY  . albuterol (PROAIR HFA) 108 (90 BASE) MCG/ACT inhaler Inhale 2 puffs into the lungs every 6 (six) hours as needed for wheezing or shortness of breath.   . Alpha-D-Galactosidase (BEANO PO) Take 1-2 tablets by mouth daily as needed (for gas).  Marland Kitchen aspirin EC 81 MG tablet Take 1 tablet (81 mg total) by mouth daily.  Marland Kitchen azelastine (ASTELIN) 0.1 % nasal spray Place 2 sprays into both nostrils 2 (two) times daily as needed for rhinitis. Use in each nostril as directed  . clonazePAM (KLONOPIN) 0.5 MG tablet TAKE 1/2 TO 1 TABLET TWICE A DAY AS NEEDED FOR ANXIETY (Patient taking differently: Take 0.25-0.5 mg by mouth 2 (two) times daily as needed for anxiety. )  . ezetimibe (ZETIA) 10 MG tablet TAKE 1 TABLET BY MOUTH EVERY DAY  . famotidine (PEPCID) 20 MG tablet TAKE 1 TABLET (20 MG TOTAL) BY MOUTH DAILY. (Patient taking differently: Take 20 mg by mouth daily. )  . hydrochlorothiazide (HYDRODIURIL) 25 MG tablet TAKE 1 TABLET BY MOUTH EVERY DAY  . JARDIANCE 10 MG TABS tablet TAKE 1 TABLET BY MOUTH EVERY DAY  . lubiprostone ([name]) 24 MCG capsule Take 1 capsule (24 mcg total) by mouth 2 (two) times daily with a meal.  . metFORMIN (GLUCOPHAGE-XR) 500 MG 24 hr tablet TAKE 1 TABLET BY MOUTH EVERY DAY WITH BREAKFAST  . Multiple Vitamin (MULTIVITAMIN)  capsule Take 1 capsule by mouth daily.   . nitroGLYCERIN (NITROSTAT) 0.4 MG SL tablet Place 1 tablet (0.4 mg total) under the tongue every 5 (five) minutes as needed for chest pain.  Marland Kitchen OXcarbazepine (TRILEPTAL) 150 MG tablet TAKE 1 TABLET BY MOUTH TWICE A DAY  . polyethylene glycol (MIRALAX / GLYCOLAX) packet Take 17 g by mouth daily. (Patient taking differently: Take 17 g by mouth as needed for moderate constipation. )  .  potassium chloride SA (KLOR-CON M20) 20 MEQ tablet Take 1 tablet (20 mEq total) by mouth 3 (three) times daily.  . Probiotic Product (PROBIOTIC PO) Take 1 capsule by mouth daily after breakfast.   . rosuvastatin (CRESTOR) 5 MG tablet Take 1 tablet (5 mg total) by mouth daily.  Marland Kitchen senna-docusate (SENOKOT-S) 8.6-50 MG tablet Take 2 tablets by mouth 2 (two) times daily.  . [DISCONTINUED] gabapentin (NEURONTIN) 800 MG tablet TAKE 1 TABLET BY MOUTH 3  TIMES DAILY (Patient taking differently: Take 800 mg by mouth 3 (three) times daily. )  . [DISCONTINUED] pregabalin (LYRICA) 50 MG capsule Take 1 capsule (50 mg total) by mouth 3 (three) times daily. Start after you are off the gabapentin  . [DISCONTINUED] Semaglutide,0.25 or 0.5MG /DOS, (OZEMPIC, 0.25 OR 0.5 MG/DOSE,) 2 MG/1.5ML SOPN Inject 0.25 mg into the skin once a week. For four weeks, then increase to 0.5 mg weekly  . [DISCONTINUED] traMADol (ULTRAM) 50 MG tablet TAKE 1 TABLET EVERY 12 HOURS AS NEEDED   No facility-administered encounter medications on file as of 11/05/2018.     Activities of Daily Living In your present state of health, do you have any difficulty performing the following activities: 11/05/2018 05/26/2018  Hearing? N N  Vision? N N  Difficulty concentrating or making decisions? N N  Walking or climbing stairs? N N  Dressing or bathing? N N  Doing errands, shopping? N Y  Conservation officer, nature and eating ? N -  Using the Toilet? N -  In the past six months, have you accidently leaked urine? N -  Do you have problems  with loss of bowel control? N -  Managing your Medications? N -  Managing your Finances? N -  Housekeeping or managing your Housekeeping? N -  Some recent data might be hidden    Patient Care Team: Binnie Rail, MD as PCP - General (Internal Medicine) Belva Crome, MD as PCP - Cardiology (Cardiology) Susa Day, MD (Orthopedic Surgery) Izora Gala, MD Blanch Media, MD (Neurology) Satira Sark, MD (Cardiology) Chesley Mires, MD (Pulmonary Disease) Erline Levine, MD (Neurosurgery) Druscilla Brownie, MD (Dermatology)    Assessment:   This is a routine wellness examination for [name]. Physical assessment deferred to PCP.  Exercise Activities and Dietary recommendations Current Exercise Habits: Home exercise routine, Type of exercise: walking(PT exercises for knee, will return to water aerobics after covid-19 crisis), Time (Minutes): 30, Frequency (Times/Week): 4, Weekly Exercise (Minutes/Week): 120, Intensity: Mild, Exercise limited by: orthopedic condition(s)  et (meal preparation, eat out, water intake, caffeinated beverages, dairy products, fruits and vegetables): in general, a "healthy" diet  , well balanced eats a variety of fruits and vegetables daily, limits salt, fat/cholesterol, sugar,carbohydrates,caffeine, drinks 6-8 glasses of water daily.  Reviewed heart healthy and diabetic diet.  Wants to continue on weight loss journey. Patient states she has lost approximately 14 ponds.   Goals    . Patient Stated     Continue to lose weight, [facility] water aerobic, eat healthy and low sugar and carbohydrates. Enjoy life, family, friends and worship God.    . Patient Stated       Fall Risk Fall Risk  11/05/2018 07/09/2018 10/11/2017 04/21/2017 04/10/2017  Falls in the past year? 0 0 No No No  Number falls in past yr: 0 0 - - -  Injury with Fall? - 0 - - -  Risk for fall due to : - - Impaired mobility - -  Follow up - Falls  evaluation completed - - -     Depression Screen PHQ 2/9 Scores 11/05/2018 10/11/2017 04/10/2017 02/03/2016  PHQ - 2 Score 0 1 0 0  PHQ- 9 Score - 4 - -     Cognitive Function MMSE - Mini Mental State Exam 11/05/2018 10/11/2017  Orientation to time 5 5  Orientation to Place 5 5  Registration 3 3  Attention/ Calculation 4 5  Recall 3 2  Language- name 2 objects 2 2  Language- repeat 1 1  Language- follow 3 step command 3 3  Language- read & follow direction 1 1  Write a sentence 1 1  Copy design 1 1  Total score 29 29        Immunization History  Administered Date(s) Administered  . Influenza Split 03/28/2011, 02/26/2012, 03/20/2013  . Influenza Whole 02/25/2009, 04/01/2010  . Influenza, High Dose Seasonal PF 03/03/2017, 05/28/2018  . Influenza-Unspecified 09/05/2014, 03/27/2015, 03/27/2016, 02/25/2017  . Pneumococcal Conjugate-13 09/14/2015  . Pneumococcal Polysaccharide-23 06/28/2007, 10/11/2017  . Tetanus 05/02/2012   Screening Tests Health Maintenance  Topic Date Due  . OPHTHALMOLOGY EXAM  07/25/2015  . HEMOGLOBIN A1C  01/16/2019  . INFLUENZA VACCINE  01/26/2019  . URINE MICROALBUMIN  02/08/2019  . COLONOSCOPY  05/08/2019  . FOOT EXAM  09/11/2019  . TETANUS/TDAP  05/02/2022  . DEXA SCAN  Completed  . PNA vac Low Risk Adult  Completed      Plan:      Reviewed health maintenance screenings with patient today and relevant education, vaccines, and/or referrals were provided.   Bring a copy of your living will and/or healthcare power of attorney to your next office visit.  Continue to eat heart healthy diet (full of fruits, vegetables, whole grains, lean protein, water--limit salt, fat, and sugar intake) and increase physical activity as tolerated.   I have personally reviewed and noted the following in the patient's chart:   . Medical and social history . Use of alcohol, tobacco or illicit drugs  . Current medications and supplements . Functional ability and status . Nutritional status .  Physical activity . Advanced directives . List of other physicians . Vitals . Screenings to include cognitive, depression, and falls . Referrals and appointments  In addition, I have reviewed and discussed with patient certain preventive protocols, quality metrics, and best practice recommendations. A written personalized care plan for preventive services as well as general preventive health recommendations were provided to patient.     Kristin Cowboy, RN  11/05/2018   Medical screening examination/treatment/procedure(s) were performed by non-physician practitioner and as supervising physician I was immediately available for consultation/collaboration. I agree with above. Binnie Rail, MD

## 2018-11-05 NOTE — Assessment & Plan Note (Signed)
We started Ozempic 2 months ago, which she is taking once a week along with her other medications She has lost 14 pounds in the past 2 months She is compliant with a diabetic diet and is exercising a little Sugars well controlled at home Check A1c, CMP today Follow-up in 3 months

## 2018-11-05 NOTE — Assessment & Plan Note (Signed)
Taking Lyrica 50 mg 3 times a day and she states that has helped her nerve pain Although the pain is better she thinks it could still be better controlled Will increase Lyrica to 75 mg 3 times daily Follow-up in 3 months

## 2018-11-14 ENCOUNTER — Other Ambulatory Visit: Payer: Self-pay | Admitting: Cardiology

## 2018-11-25 ENCOUNTER — Other Ambulatory Visit: Payer: Self-pay | Admitting: Internal Medicine

## 2018-12-04 ENCOUNTER — Encounter: Payer: Self-pay | Admitting: Internal Medicine

## 2018-12-06 ENCOUNTER — Encounter: Payer: Self-pay | Admitting: Internal Medicine

## 2018-12-11 ENCOUNTER — Other Ambulatory Visit: Payer: Self-pay | Admitting: Internal Medicine

## 2018-12-14 ENCOUNTER — Other Ambulatory Visit: Payer: Self-pay | Admitting: Internal Medicine

## 2019-01-01 DIAGNOSIS — M545 Low back pain: Secondary | ICD-10-CM | POA: Diagnosis not present

## 2019-01-01 DIAGNOSIS — M25511 Pain in right shoulder: Secondary | ICD-10-CM | POA: Diagnosis not present

## 2019-01-09 ENCOUNTER — Ambulatory Visit: Payer: Medicare Other | Admitting: Neurology

## 2019-01-10 ENCOUNTER — Telehealth: Payer: Self-pay | Admitting: Emergency Medicine

## 2019-01-10 DIAGNOSIS — M25511 Pain in right shoulder: Secondary | ICD-10-CM | POA: Diagnosis not present

## 2019-01-10 NOTE — Telephone Encounter (Signed)
Received fax from [facility] order pharmacy for refills request. Not on pts chart. LVM to clarify with pt before sending in meds.

## 2019-01-14 ENCOUNTER — Other Ambulatory Visit: Payer: Self-pay | Admitting: Internal Medicine

## 2019-01-18 ENCOUNTER — Other Ambulatory Visit: Payer: Self-pay

## 2019-01-18 NOTE — Patient Outreach (Signed)
  [facility]) Care Management Chronic Special Needs Program  01/18/2019  Name: ADANELY REYNOSO DOB: 1941/04/01  MRN: [id number]  Ms. Verbena Boeding is enrolled in a chronic special needs plan for Diabetes. Client called with no answer No answer and HIPAA compliant message left. 1st attempt Plan for 2nd outreach call in one week Chronic care management coordinator will attempt outreach in one week.   Peter Garter RN, Jackquline Denmark, CDE Chronic Care Management Coordinator [facility] Care Management [phone]

## 2019-01-21 DIAGNOSIS — M25511 Pain in right shoulder: Secondary | ICD-10-CM | POA: Diagnosis not present

## 2019-01-21 HISTORY — DX: Pain in right shoulder: M25.511

## 2019-01-22 ENCOUNTER — Other Ambulatory Visit: Payer: Self-pay

## 2019-01-22 NOTE — Patient Outreach (Signed)
[place] [facility]) Care Management Chronic Special Needs Program  01/22/2019  Name: [name] DOB: 02/02/1941  MRN: [id number]  [name] is enrolled in a chronic special needs plan for Diabetes. Chronic Care Management Coordinator telephoned client to review health risk assessment and to develop individualized care plan.  Introduced the chronic care management program, importance of client participation, and taking their care plan to all provider appointments and inpatient facilities.  Reviewed the transition of care process and possible referral to community care management.  Subjective: Client states that she is in the doughnut hole and she can not afford her Jardiance and she knows she will have problems with some of her other medications.  States she does not drive and she depends on family and friends but she would like some other options  States she has had some falls in the past and the last fall was 2 months ago.  States she uses her cane most of the time.States she is trying to watch what she eats for her diabetes and high blood pressure.  States she checks her blood sugars at least once a day and they range 110-150 in the morning  Goals Addressed            This Visit's Progress   .  Acknowledge receipt of Programme researcher, broadcasting/film/video      . Advanced Care Planning complete by next 9 months       Triad Nurse, children's will contact you    . Client understands the importance of follow-up with providers by attending scheduled visits      . Client will report abillity to obtain Medications within 3 months       Triad Engineer, site will contact you    . Client will report improved coping by next 6 months      . Client will report no fall or injuries in the next 3 months.      . Client will report no worsening of symptoms related to heart disease within the next 6 months      . Client will use Assistive Devices as needed and  verbalize understanding of device use       [employer] approved meters-One Touch, Freestyle or Precision     . Client will verbalize knowledge of self management of Hypertension as evidences by BP reading of 140/90 or less; or as defined by provider      . HEMOGLOBIN A1C < 7.0       Diabetes self management actions:  Glucose monitoring per provider recommendations  Eat Healthy  Check feet daily  Visit provider every 3-6 months as directed  Hbg A1C level every 3-6 months.  Eye Exam yearly    . Maintain timely refills of diabetic medication as prescribed within the year .      Marland Kitchen Obtain annual  Lipid Profile, LDL-C      . Obtain Annual Eye (retinal)  Exam       . Obtain Annual Foot Exam      . Obtain annual screen for micro albuminuria (urine) , nephropathy (kidney problems)      . Obtain Hemoglobin A1C at least 2 times per year      . Visit Primary Care Provider or Endocrinologist at least 2 times per year        Client is not meeting diabetes self management goal of hemoglobin A1C of <7% with last reading of 7.7%.  Client agreeable to referral to pharmacy review medications >8 medications, assist with pharmacy assistance programs and delivery issues.  Client agreeable to referral to Social Work for transportation options and assistance with Biochemist, clinical on [place] approved meters-One Touch, Freestyle or Precision and to contact her provider to get new Rx when she is getting low on her current strips Instructed on low CHO, low sodium diet and portion control Reviewed home safety and fall precautions Reviewed number for 24 hour nurse Line Discussed COVID19 cause, symptoms, precautions (social distancing, stay at home order, hand washing), confirmed client knows how to contact provider.  Plan:  Send successful outreach letter with a copy of their individualized care plan, Send individual care plan to provider and Send educational material-HTN,  Heart disease, fall safety, Advanced Directives   Chronic care management coordination will outreach in:  3 Months  Will refer client to:  Social Work and Eunice RN, [place], [place] Programmer, multimedia Care Management [phone]

## 2019-01-24 ENCOUNTER — Other Ambulatory Visit: Payer: Self-pay

## 2019-01-24 ENCOUNTER — Other Ambulatory Visit: Payer: Self-pay | Admitting: Pharmacist

## 2019-01-24 NOTE — Patient Outreach (Signed)
[name] [facility]) Care Management  [place]  01/24/2019  [name] 1941/01/03 [id number]   Reason for referral: Medication Assistance  Referral source: [employer] C-SNP Care Manager with [facility] Current insurance: [employer] C-SNP  Outreach:  Unsuccessful telephone call attempt #1 to patient.   HIPAA compliant voicemail left requesting a return call  Plan:  -I will mail patient an unsuccessful outreach letter.  -I will make another outreach attempt to patient within 3-4 business days.    Ralene Bathe, PharmD, [place] [phone]

## 2019-01-24 NOTE — Patient Outreach (Signed)
[place]) Care Management  01/24/2019  [name] 11/01/1940 [id number]   Social work referral received from [employer].  "CSNP client would like assistance with transportation and Advanced Directives. She is also overwhelmed with dealing with all of her health issues ." Unsuccessful outreach today.   No answer on home number and no option to leave voicemail message.  Mobile number went straight to voicemail but mailbox was not set up.   Unsuccessful outreach letter mailed today by [facility] Pharmacist, Ralene Bathe.   BSW will attempt to reach again within four business days.  Ronn Melena, BSW Social Worker [phone]

## 2019-01-28 ENCOUNTER — Other Ambulatory Visit: Payer: Self-pay

## 2019-01-28 ENCOUNTER — Ambulatory Visit: Payer: Self-pay

## 2019-01-28 NOTE — Patient Outreach (Signed)
[place] [facility]) Care Management  01/28/2019  DILLYN JOAQUIN September 24, 1940 [id number]   Social work referral received from [employer].  "CSNP client would like assistance with transportation and Advanced Directives. She is also overwhelmed with dealing with all of her health issues ." Second unsuccessful outreach today.   Left message at home number.  Mobile number went straight to voicemail but mailbox was not set up.   Unsuccessful outreach letter mailed by [facility] Pharmacist, Ralene Bathe, on 01/24/19. BSW will attempt to reach again within four business days.  Ronn Melena, BSW Social Worker [phone]

## 2019-01-30 ENCOUNTER — Other Ambulatory Visit: Payer: Self-pay

## 2019-01-30 ENCOUNTER — Ambulatory Visit: Payer: Self-pay

## 2019-01-30 NOTE — Patient Outreach (Signed)
  [place] [facility]) Care Management Chronic Special Needs Program   01/30/2019  Name: [name], DOB: 22-May-1941  MRN: [id number]  The client was discussed in today's interdisciplinary care team meeting.  The following issues were discussed:  Client's needs, Key risk triggers/risk stratification, Care Plan, Coordination of care and Issues/barriers to care  Participants present:   Mahlon Gammon, MSN, RN, CCM, CNS    Thea Silversmith, MSN, RN, CCM   Davia Smyre RN,BSN,CCM, CDE   Marco Collie, MD  Gilda Crease, PharmD, RPh Bary Castilla, RN, BSN, MS, CCM Coralie Carpen, MD  Recommendations:  Referral to pharmacy and social work have been made.  Plan:  Follow up on referrals to pharmacy and social work  Follow-up: as scheduled in 3 months or sooner if needed  Peter Garter RN, Jackquline Denmark, [place] Management [phone]

## 2019-01-30 NOTE — Patient Outreach (Signed)
[place] [facility]) Care Management  01/30/2019  [name] [birth year] [id number]   Social work referral received from [employer]. "CSNP client would like assistance with transportation and Advanced Directives. She is also overwhelmed with dealing with all of her health issues." Third unsuccessful outreach today.  Left message at home number.  Mobile number went straight to voicemail but mailbox was not set up.  Unsuccessful outreach letter mailed by [facility] Pharmacist, Ralene Bathe, on 01/24/19. BSW will close case if no response by 02/06/19.    Ronn Melena, BSW Social Worker [phone]

## 2019-01-31 ENCOUNTER — Ambulatory Visit: Payer: Self-pay | Admitting: Pharmacist

## 2019-01-31 ENCOUNTER — Other Ambulatory Visit: Payer: Self-pay | Admitting: Pharmacist

## 2019-01-31 NOTE — Patient Outreach (Signed)
[facility]) Care Management  [place]   01/31/2019  [name] [birth year] [id number]  Reason for referral: Medication Assistance  Referral source: [employer] C-SNP Care Manager with [facility] Current insurance: [employer] C-SNP  Outreach:  Unsuccessful telephone call attempt #2 to patient. HIPAA compliant voicemail left requesting a return call  Plan:  -I will make another outreach attempt to patient within 3-4 business days.    Ralene Bathe, PharmD, [place] [phone]

## 2019-02-01 ENCOUNTER — Other Ambulatory Visit: Payer: Self-pay | Admitting: Pharmacist

## 2019-02-01 NOTE — Patient Outreach (Addendum)
[facility] [facility]) Care Management  [place]  02/01/2019  [name] [birth year] [id number]   Reason for referral: Medication Assistance  Referral source: [employer] C-SNP Care Manager with [facility] Current insurance: [employer] C-SNP  Incoming call and voicemail from patient.   Outreach:  Unsuccessful return call to patient.   HIPAA compliant voicemail left requesting a return call  Plan:  -I will make another outreach attempt to patient in 3-4 days.    Ralene Bathe, PharmD, [place] [phone]   Addendum: Incoming call from patient.  HIPAA identifiers verified. Patient reports she is in the coverage gap right now.  She states her most expensive medications are Ezetimibe, Jardiance  [name], Ozempic, ProAir, Lyrica.  She states she has a household of 1 (monthly income ~$2000).    Medications Reviewed Today    Reviewed by Rudean Haskell, RPH (Pharmacist) on 02/01/19 at 1608  Med List Status: <None>  Medication Order Taking? Sig Documenting Provider Last Dose Status Informant  ACCU-CHEK AVIVA PLUS test strip [id number] Yes USE TO CHECK BLOOD SUGARS  TWO TIMES DAILY Burns, Claudina Lick, MD Taking Active Self  albuterol (PROAIR HFA) 108 (90 BASE) MCG/ACT inhaler [id number] Yes Inhale 2 puffs into the lungs every 6 (six) hours as needed for wheezing or shortness of breath.  [provider] Taking Active Self  Alpha-D-Galactosidase (BEANO PO) [id number] No Take 1-2 tablets by mouth daily as needed (for gas). [provider] Not Taking Active Self  [name] 24 MCG capsule [id number] Yes TAKE 1 CAPSULE (24 MCG TOTAL) BY MOUTH 2 (TWO) TIMES DAILY WITH A MEAL. Binnie Rail, MD Taking Active   aspirin EC 81 MG tablet [id number] Yes Take 1 tablet (81 mg total) by mouth daily. Belva Crome, MD Taking Active Self  azelastine (ASTELIN) 0.1 % nasal spray [id number] No Place 2 sprays into both nostrils 2  (two) times daily as needed for rhinitis. Use in each nostril as directed [provider] Not Taking Active Self        Discontinued 09/26/14 2148 (Inpatient Standard)   clonazePAM (KLONOPIN) 0.5 MG tablet [id number] Yes TAKE 1/2 TO 1 TABLET TWICE A DAY AS NEEDED FOR ANXIETY  Patient taking differently: Take 0.25-0.5 mg by mouth 2 (two) times daily as needed for anxiety.    Binnie Rail, MD Taking Active Self           Med Note Iva Lento, Octavio Graves   Fri Feb 01, 2019  4:04 PM)    ezetimibe (ZETIA) 10 MG tablet [id number] Yes TAKE 1 TABLET BY MOUTH EVERY DAY Burns, Claudina Lick, MD Taking Active   famotidine (PEPCID) 20 MG tablet [id number] Yes TAKE 1 TABLET (20 MG TOTAL) BY MOUTH DAILY.  Patient taking differently: Take 20 mg by mouth daily.    Binnie Rail, MD Taking Active Self  hydrochlorothiazide (HYDRODIURIL) 25 MG tablet [id number] Yes TAKE 1 TABLET BY MOUTH EVERY DAY Binnie Rail, MD Taking Active   JARDIANCE 10 MG TABS tablet [id number] Yes TAKE 1 TABLET BY MOUTH EVERY DAY Burns, Claudina Lick, MD Taking Active   metFORMIN (GLUCOPHAGE-XR) 500 MG 24 hr tablet [id number] Yes TAKE 1 TABLET BY MOUTH EVERY DAY WITH BREAKFAST Burns, Claudina Lick, MD Taking Active   Multiple Vitamin (MULTIVITAMIN) capsule [id number] Yes Take 1 capsule by mouth daily.  [provider] Taking Active Self  nitroGLYCERIN (NITROSTAT) 0.4 MG SL tablet [id number] Yes Place 1 tablet (0.4  mg total) under the tongue every 5 (five) minutes as needed for chest pain. Lyda Jester M, PA-C Taking Active   OXcarbazepine (TRILEPTAL) 150 MG tablet [id number] Yes TAKE 1 TABLET BY MOUTH TWICE A DAY Patel, Donika K, DO Taking Active   polyethylene glycol (MIRALAX / GLYCOLAX) packet [id number] Yes Take 17 g by mouth daily.  Patient taking differently: Take 17 g by mouth as needed for moderate constipation.    Susa Day, MD Taking Active Self  potassium chloride SA (KLOR-CON M20) 20 MEQ tablet [id number] Yes Take 1 tablet (20 mEq  total) by mouth 3 (three) times daily. Binnie Rail, MD Taking Active   pregabalin (LYRICA) 75 MG capsule [id number] Yes Take 1 capsule (75 mg total) by mouth 3 (three) times daily. Binnie Rail, MD Taking Active   Probiotic Product (PROBIOTIC PO) [id number] Yes Take 1 capsule by mouth daily after breakfast.  [provider] Taking Active Self           Med Note Iva Lento, Jarelis Ehlert E   Fri Feb 01, 2019  4:07 PM)    rosuvastatin (CRESTOR) 5 MG tablet [id number] Yes TAKE 1 TABLET BY MOUTH EVERY DAY Lyda Jester M, PA-C Taking Active   Semaglutide,0.25 or 0.5MG/DOS, (OZEMPIC, 0.25 OR 0.5 MG/DOSE,) 2 MG/1.5ML SOPN [id number] Yes Inject 0.5 mg into the skin once a week. Binnie Rail, MD Taking Active   senna-docusate (SENOKOT-S) 8.6-50 MG tablet [id number] Yes Take 2 tablets by mouth 2 (two) times daily.  Patient taking differently: Take 2 tablets by mouth 2 (two) times daily. Taking 1-2x daily PRN   Consuelo Pandy, PA-C Taking Active           Assessment:   Drugs sorted by system:  Neurologic/Psychologic: clonazepam, oxcarbazepine, pregabaln  Hematologic: aspirin 40m  Cardiovascular: ezetimibe, HCTZ, NTG SL, rosuvastatin  Pulmonary/Allergy: albuterol inhaler, azelastine NS  Gastrointestinal: alpha-d-galactosidase, amitiza, famotidine, polyethylene glycol, probiotic, senna-docusate  Endocrine: empagliflozin, metformin, semaglutide  Vitamins/Minerals/Supplements: MVI, potassium  Medication Review Findings:  Clonazepam: This drug is identified in the [place] Criteria as a potentially inappropriate medication to be avoided in patients 65 years and older (independent of diagnosis or condition) due to increased risk of impaired cognition, delirium, falls, fractures, and motor vehicle accidents with benzodiazepine use.  Monitor use closely and adjust as clinically warranted.    Medication Assistance Findings:  Medication assistance needs identified: Lyrica, Ozempic, Zetia,  Jardiance, Albuterol inhaler, [name]  Extra Help:  Not eligible for Extra Help Low Income Subsidy based on reported income and assets  Patient Assistance Programs:Zetia + Proventil, Jardiance, Ozempic, Lyrica,, [name] Zetia and Proventil made by MDIRECTVo Income requirement met: Yes o Out-of-pocket prescription expenditure met:   Not Applicable - Patient has met application requirements to apply for this program.  - Reviewed program requirements with patient.    Jardiance made by BEast Gaffneyrequirement met: Yes o Out-of-pocket prescription expenditure met:   Not Applicable - Patient has met application requirements to apply for this program.  - Reviewed program requirements with patient.    Lyrica made by [place]requirement met: Yes o Out-of-pocket prescription expenditure met:   Not Applicable - Patient has met application requirements to apply for this program.  - Reviewed program requirements with patient.    Amtiza made by TArrow Electronicsrequirement met: Yes o Out-of-pocket prescription expenditure met:   Not Applicable - Patient has met application requirements to apply for this program.  - Reviewed program requirements with  patient.    Ozempic made by [place] requirement met: Yes o Out-of-pocket prescription expenditure met:   Not Applicable - Patient has met application requirements to apply for this program.  - Reviewed program requirements with patient.    Plan: -I will route patient assistance letter to [place] technician who will coordinate patient assistance program application process for medications listed above.  [facility] pharmacy technician will assist with obtaining all required documents from both patient and provider(s) and submit application(s) once completed.    Ralene Bathe, PharmD, [place] [phone]

## 2019-02-04 ENCOUNTER — Other Ambulatory Visit: Payer: Self-pay | Admitting: Internal Medicine

## 2019-02-04 ENCOUNTER — Other Ambulatory Visit: Payer: Self-pay | Admitting: Pharmacy Technician

## 2019-02-04 MED ORDER — PREGABALIN 75 MG PO CAPS: 75.0000 mg | ORAL_CAPSULE | Freq: Three times a day (TID) | ORAL | 5 refills | Status: DC

## 2019-02-04 NOTE — Patient Outreach (Signed)
[name] Care Management  02/04/2019  [name] 07/07/1940 [id number]                          Medication Assistance Referral  Referral From: [facility] RPh Waynard Reeds  Medication/Company: [name] & Chauncey Cruel / Merck Patient application portion:  Education officer, museum portion: Interoffice Mailed to Dr. Jenness Corner  Medication/Company: Recardo Evangelist / Pfizer Patient application portion:  Mailed Provider application portion: Faxed  to Dr. Jenness Corner  Medication/Company: Baker Pierini / [employer] Patient application portion:  Mailed Provider application portion: Faxed to Dr. Jenness Corner  Medication/Company: Larna Daughters / Novo Nordisk Patient application portion:  Education officer, museum portion: Faxed to Dr. Jenness Corner  Medication/Company: Vania Rea / B-I Patient application portion:  Mailed Provider application portion: Faxed  to Dr. Jenness Corner  Follow up:  Will follow up with patient in 7-10 business days to confirm application(s) have been received.  [name] [name] [place] Certified Pharmacy Technician [place] Management Direct Dial:[phone]

## 2019-02-05 NOTE — Patient Instructions (Addendum)
  Tests ordered today. Your results will be released to MyChart (or called to you) after review.  If any changes need to be made, you will be notified at that same time.    Medications reviewed and updated.  Changes include :   none     Please followup in 6 months   

## 2019-02-05 NOTE — Progress Notes (Signed)
Subjective:    Patient ID: [name], female    DOB: Apr 19, 1941, 78 y.o.   MRN: [id number]  HPI The patient is here for follow up.  She is exercising a little.   She does a lot of stretching a little bit of walking.  Diabetes: She is taking her medication daily as prescribed. She is compliant with a diabetic diet.  She monitors her sugars and they have been running 101, 111, 109. She is up-to-date with an ophthalmology examination.   Diabetic neuropathy:  We increased her dose of lyrica at her last visit.  She takes the lyrica three times a day.   She has tingling from her mid lower leg to her toes.  She has only occasional pain in her toes.   Chronic constipation:  She started [place] and it helps, but is slow.  She is taking a stool softener.  She wonders if she should try IBgard or Colace.  Hypertension: She is taking her medication daily. She is compliant with a low sodium diet.  She denies palpitations, edema and regular headaches.  Hyperlipidemia: She is taking her medication daily. She is compliant with a low fat/cholesterol diet. She denies myalgias.   Anxiety: She is taking her 1/2 clonazepam prn only.  She denies any side effects from the medication. She feels her anxiety is well controlled and she is happy with her current dose of medication.   GERD:  She is taking her medication daily as prescribed.  She has occasional GERD symptoms but feels her GERD is fairly controlled.   Right ear tinnitus and vertigo.  She does have decreased hearing in the ear.  The symptoms are not new.  She knows she should have a hearing aid, but cannot afford it.   Medications and allergies reviewed with patient and updated if appropriate.  Patient Active Problem List   Diagnosis Date Noted  . CAD (coronary artery disease) 05/28/2018  . Abnormal CT scan, heart   . Neck pain 07/21/2017  . Decreased hearing of right ear 02/01/2017  . Primary osteoarthritis of left knee 01/12/2017  .  Constipation 01/10/2017  . Hypercalcemia 10/12/2016  . Chest pain 10/12/2016  . DOE (dyspnea on exertion) 10/12/2016  . Chronic back pain 08/23/2016  . Hair loss 08/23/2016  . Diabetes ([place]) 07/26/2016  . Anxiety 09/14/2015  . Muscle pain, myofacial 05/19/2015  . Diverticulitis large intestine w/o perforation or abscess w/o bleeding 05/12/2014  . Essential hypertension 05/12/2014  . Diabetic neuropathy ([place]) 05/12/2014  . Seizure disorder ([place]) 05/12/2014  . Hepatic steatosis 05/12/2014  . Obesity (BMI 30-39.9) 05/12/2014  . Cerebrovascular disease, unspecified 03/13/2013  . Allergic rhinitis 03/13/2013  . GOITER, MULTINODULAR 05/18/2009  . VERTIGO 04/13/2009  . Irritable bowel syndrome 03/20/2009  . INSOMNIA 03/20/2009  . Hyperlipidemia 03/18/2009  . GERD 03/18/2009    Current Outpatient Medications on File Prior to Visit  Medication Sig Dispense Refill  . ACCU-CHEK AVIVA PLUS test strip USE TO CHECK BLOOD SUGARS  TWO TIMES DAILY 200 each 2  . albuterol (PROAIR HFA) 108 (90 BASE) MCG/ACT inhaler Inhale 2 puffs into the lungs every 6 (six) hours as needed for wheezing or shortness of breath.     . Alpha-D-Galactosidase (BEANO PO) Take 1-2 tablets by mouth daily as needed (for gas).    . [name] 24 MCG capsule TAKE 1 CAPSULE (24 MCG TOTAL) BY MOUTH 2 (TWO) TIMES DAILY WITH A MEAL. 180 capsule 1  . aspirin EC 81 MG  tablet Take 1 tablet (81 mg total) by mouth daily. 90 tablet 3  . azelastine (ASTELIN) 0.1 % nasal spray Place 2 sprays into both nostrils 2 (two) times daily as needed for rhinitis. Use in each nostril as directed    . clonazePAM (KLONOPIN) 0.5 MG tablet TAKE 1/2 TO 1 TABLET TWICE A DAY AS NEEDED FOR ANXIETY (Patient taking differently: Take 0.25-0.5 mg by mouth 2 (two) times daily as needed for anxiety. ) 60 tablet 0  . ezetimibe (ZETIA) 10 MG tablet TAKE 1 TABLET BY MOUTH EVERY DAY 90 tablet 1  . famotidine (PEPCID) 20 MG tablet TAKE 1 TABLET (20 MG TOTAL) BY MOUTH  DAILY. (Patient taking differently: Take 20 mg by mouth daily. ) 90 tablet 1  . hydrochlorothiazide (HYDRODIURIL) 25 MG tablet TAKE 1 TABLET BY MOUTH EVERY DAY 90 tablet 1  . JARDIANCE 10 MG TABS tablet TAKE 1 TABLET BY MOUTH EVERY DAY 90 tablet 0  . metFORMIN (GLUCOPHAGE-XR) 500 MG 24 hr tablet TAKE 1 TABLET BY MOUTH EVERY DAY WITH BREAKFAST 90 tablet 1  . Multiple Vitamin (MULTIVITAMIN) capsule Take 1 capsule by mouth daily.     . nitroGLYCERIN (NITROSTAT) 0.4 MG SL tablet Place 1 tablet (0.4 mg total) under the tongue every 5 (five) minutes as needed for chest pain. 25 tablet 2  . OXcarbazepine (TRILEPTAL) 150 MG tablet TAKE 1 TABLET BY MOUTH TWICE A DAY 180 tablet 1  . polyethylene glycol (MIRALAX / GLYCOLAX) packet Take 17 g by mouth daily. (Patient taking differently: Take 17 g by mouth as needed for moderate constipation. ) 14 each 0  . potassium chloride SA (KLOR-CON M20) 20 MEQ tablet Take 1 tablet (20 mEq total) by mouth 3 (three) times daily. 270 tablet 1  . pregabalin (LYRICA) 75 MG capsule Take 1 capsule (75 mg total) by mouth 3 (three) times daily. 90 capsule 5  . Probiotic Product (PROBIOTIC PO) Take 1 capsule by mouth daily after breakfast.     . rosuvastatin (CRESTOR) 5 MG tablet TAKE 1 TABLET BY MOUTH EVERY DAY 90 tablet 0  . Semaglutide,0.25 or 0.5MG /DOS, (OZEMPIC, 0.25 OR 0.5 MG/DOSE,) 2 MG/1.5ML SOPN Inject 0.5 mg into the skin once a week. 1 pen 5  . senna-docusate (SENOKOT-S) 8.6-50 MG tablet Take 2 tablets by mouth 2 (two) times daily. (Patient taking differently: Take 2 tablets by mouth 2 (two) times daily. Taking 1-2x daily PRN) 60 tablet 0  . [DISCONTINUED] Calcium Carbonate (CALCIUM 500 PO) Take 1 capsule by mouth every other day.      No current facility-administered medications on file prior to visit.     Past Medical History:  Diagnosis Date  . Allergic rhinitis, cause unspecified 03/13/2013  . Anxiety   . Cerebrovascular disease, unspecified 03/13/2013   Atrophy  and small vessel dz noted, MR brain 2009  . Depression   . Diabetes mellitus, type 2 ([place])   . Diverticulosis of colon 03/2010 hosp  . Dyslipidemia   . GERD (gastroesophageal reflux disease)   . Hypertension   . Neuropathy   . OSA on CPAP   . Osteoarthritis of shoulder region    and Knee  . Seizure disorder ([place])    onset 11 years ago; repeated 2013    Past Surgical History:  Procedure Laterality Date  . ABDOMINAL HYSTERECTOMY  1970's   Partial  . APPENDECTOMY    . CHOLECYSTECTOMY    . LEFT HEART CATH AND CORONARY ANGIOGRAPHY N/A 05/28/2018   Procedure: LEFT HEART  CATH AND CORONARY ANGIOGRAPHY;  Surgeon: Belva Crome, MD;  Location: [facility];  Service: Cardiovascular;  Laterality: N/A;  . LUMBAR EPIDURAL INJECTION Left 08/25/2017  . SHOULDER SURGERY  2008   LT, post fall   . TONSILLECTOMY AND ADENOIDECTOMY    . TOTAL KNEE ARTHROPLASTY Left 01/12/2017   Procedure: LEFT TOTAL KNEE ARTHROPLASTY;  Surgeon: Susa Day, MD;  Location: [facility];  Service: Orthopedics;  Laterality: Left;  120 mins    Social History   Socioeconomic History  . Marital status: Widowed    Spouse name: Not on file  . Number of children: 2  . Years of education: Not on file  . Highest education level: Not on file  Occupational History  . Occupation: retired  Scientific laboratory technician  . Financial resource strain: Not hard at all  . Food insecurity    Worry: Never true    Inability: Never true  . Transportation needs    Medical: No    Non-medical: No  Tobacco Use  . Smoking status: Former Smoker    Quit date: 10/19/1985    Years since quitting: 33.3  . Smokeless tobacco: Never Used  Substance and Sexual Activity  . Alcohol use: Yes    Alcohol/week: 0.0 standard drinks    Comment: occasionally   . Drug use: No  . Sexual activity: Never  Lifestyle  . Physical activity    Days per week: 0 days    Minutes per session: 0 min  . Stress: Only a little  Relationships  . Social connections     Talks on phone: More than three times a week    Gets together: More than three times a week    Attends religious service: More than 4 times per year    Active member of club or organization: Yes    Attends meetings of clubs or organizations: More than 4 times per year    Relationship status: Widowed  Other Topics Concern  . Not on file  Social History Narrative   Patient lives in a one story home.  Has 2 children.  Retired from [employer].    Family History  Problem Relation Age of Onset  . Arthritis Mother   . Heart disease Father   . Arthritis Other        Grandmother  . Diabetes Other        Grandmother    Review of Systems  Constitutional: Negative for chills and fever.  HENT: Positive for tinnitus (right ear).   Respiratory: Positive for cough (related to allergies) and shortness of breath (a little). Negative for wheezing.   Cardiovascular: Positive for chest pain (occ - cardiolog aware - may be on right side, cetnral or left side). Negative for palpitations and leg swelling.  Gastrointestinal: Positive for abdominal pain (only occasional) and constipation.  Musculoskeletal: Positive for arthralgias.  Neurological: Positive for dizziness and headaches.       Objective:   Vitals:   02/06/19 1112  BP: (!) 128/58  Pulse: 75  Resp: 16  Temp: 99.2 F (37.3 C)  SpO2: 96%   BP Readings from Last 3 Encounters:  02/06/19 (!) 128/58  11/05/18 130/72  11/05/18 130/72   Wt Readings from Last 3 Encounters:  02/06/19 158 lb 1.3 oz (71.7 kg)  11/05/18 164 lb (74.4 kg)  11/05/18 164 lb 1.9 oz (74.4 kg)   Body mass index is 28.91 kg/m.   Physical Exam    Constitutional: Appears well-developed and well-nourished.  No distress.  HENT:  Head: Normocephalic and atraumatic.  Neck: Neck supple. No tracheal deviation present. No thyromegaly present.  No cervical lymphadenopathy Cardiovascular: Normal rate, regular rhythm and normal heart sounds.   No murmur heard. No carotid  bruit .  No edema Pulmonary/Chest: Effort normal and breath sounds normal. No respiratory distress. No has no wheezes. No rales.  Skin: Skin is warm and dry. Not diaphoretic.  Psychiatric: Normal mood and affect. Behavior is normal.      Assessment & Plan:    See Problem List for Assessment and Plan of chronic medical problems.

## 2019-02-06 ENCOUNTER — Ambulatory Visit (INDEPENDENT_AMBULATORY_CARE_PROVIDER_SITE_OTHER): Payer: HMO | Admitting: Internal Medicine

## 2019-02-06 ENCOUNTER — Other Ambulatory Visit: Payer: Self-pay

## 2019-02-06 ENCOUNTER — Other Ambulatory Visit (INDEPENDENT_AMBULATORY_CARE_PROVIDER_SITE_OTHER): Payer: HMO

## 2019-02-06 ENCOUNTER — Encounter: Payer: Self-pay | Admitting: Internal Medicine

## 2019-02-06 VITALS — BP 128/58 | HR 75 | Temp 99.2°F | Resp 16 | Ht 62.0 in | Wt 158.1 lb

## 2019-02-06 DIAGNOSIS — F419 Anxiety disorder, unspecified: Secondary | ICD-10-CM | POA: Diagnosis not present

## 2019-02-06 DIAGNOSIS — K5900 Constipation, unspecified: Secondary | ICD-10-CM

## 2019-02-06 DIAGNOSIS — I10 Essential (primary) hypertension: Secondary | ICD-10-CM

## 2019-02-06 DIAGNOSIS — E1142 Type 2 diabetes mellitus with diabetic polyneuropathy: Secondary | ICD-10-CM

## 2019-02-06 DIAGNOSIS — K219 Gastro-esophageal reflux disease without esophagitis: Secondary | ICD-10-CM

## 2019-02-06 DIAGNOSIS — E782 Mixed hyperlipidemia: Secondary | ICD-10-CM

## 2019-02-06 LAB — COMPREHENSIVE METABOLIC PANEL
ALT: 15 U/L (ref 0–35)
AST: 19 U/L (ref 0–37)
Albumin: 4.7 g/dL (ref 3.5–5.2)
Alkaline Phosphatase: 56 U/L (ref 39–117)
BUN: 12 mg/dL (ref 6–23)
CO2: 32 mEq/L (ref 19–32)
Calcium: 10.6 mg/dL — ABNORMAL HIGH (ref 8.4–10.5)
Chloride: 93 mEq/L — ABNORMAL LOW (ref 96–112)
Creatinine, Ser: 0.72 mg/dL (ref 0.40–1.20)
GFR: 95.26 mL/min (ref >60.00)
Glucose, Bld: 118 mg/dL — ABNORMAL HIGH (ref 70–99)
Potassium: 3.1 mEq/L — ABNORMAL LOW (ref 3.5–5.1)
Sodium: 135 mEq/L (ref 135–145)
Total Bilirubin: 0.3 mg/dL (ref 0.2–1.2)
Total Protein: 7.7 g/dL (ref 6.0–8.3)

## 2019-02-06 LAB — CBC WITH DIFFERENTIAL/PLATELET
Basophils Absolute: 0 10*3/uL (ref 0.0–0.1)
Basophils Relative: 0.4 % (ref 0.0–3.0)
Eosinophils Absolute: 0.1 10*3/uL (ref 0.0–0.7)
Eosinophils Relative: 0.7 % (ref 0.0–5.0)
HCT: 41.3 % (ref 36.0–46.0)
Hemoglobin: 13.9 g/dL (ref 12.0–15.0)
Lymphocytes Relative: 44.9 % (ref 12.0–46.0)
Lymphs Abs: 3.4 10*3/uL (ref 0.7–4.0)
MCHC: 33.7 g/dL (ref 30.0–36.0)
MCV: 91 fl (ref 78.0–100.0)
Monocytes Absolute: 0.6 10*3/uL (ref 0.1–1.0)
Monocytes Relative: 7.7 % (ref 3.0–12.0)
Neutro Abs: 3.5 10*3/uL (ref 1.4–7.7)
Neutrophils Relative %: 46.3 % (ref 43.0–77.0)
Platelets: 270 10*3/uL (ref 150.0–400.0)
RBC: 4.54 Mil/uL (ref 3.87–5.11)
RDW: 14 % (ref 11.5–15.5)
WBC: 7.5 10*3/uL (ref 4.0–10.5)

## 2019-02-06 LAB — LIPID PANEL
Cholesterol: 156 mg/dL (ref 0–200)
HDL: 62.8 mg/dL (ref >39.00)
LDL Cholesterol: 66 mg/dL (ref 0–99)
NonHDL: 93.68
Total CHOL/HDL Ratio: 2
Triglycerides: 139 mg/dL (ref 0.0–149.0)
VLDL: 27.8 mg/dL (ref 0.0–40.0)

## 2019-02-06 LAB — HEMOGLOBIN A1C: Hgb A1c MFr Bld: 7.4 % — ABNORMAL HIGH (ref 4.6–6.5)

## 2019-02-06 LAB — MICROALBUMIN / CREATININE URINE RATIO
Creatinine,U: 79.4 mg/dL
Microalb Creat Ratio: 8.5 mg/g (ref 0.0–30.0)
Microalb, Ur: 6.7 mg/dL — ABNORMAL HIGH (ref 0.0–1.9)

## 2019-02-06 LAB — TSH: TSH: 1.39 u[IU]/mL (ref 0.35–4.50)

## 2019-02-06 NOTE — Assessment & Plan Note (Signed)
Chronic Not ideally controlled [name] has helped She will try ibgard and colace She deferred a referral to GI at this time

## 2019-02-06 NOTE — Assessment & Plan Note (Signed)
Continue statin Check lipid panel, TSH, CMP Encouraged continued regular activity and healthy diet

## 2019-02-06 NOTE — Assessment & Plan Note (Signed)
Has tingling from mid lower leg to toes, has pain only on occasion Overall this does sound like it is better Continue current dose of Lyrica

## 2019-02-06 NOTE — Assessment & Plan Note (Signed)
She takes half of a clonazepam only as needed-she does try not to take it Continue

## 2019-02-06 NOTE — Assessment & Plan Note (Signed)
BP well controlled Current regimen effective and well tolerated Continue current medications at current doses cmp  

## 2019-02-06 NOTE — Assessment & Plan Note (Signed)
GERD fairly controlled Continue current medications

## 2019-02-06 NOTE — Assessment & Plan Note (Signed)
Sugars well controlled at home Continue current medications for now She will continue to monitor sugars regularly A1c, urine microalbumin

## 2019-02-06 NOTE — Patient Outreach (Signed)
[place] [facility]) Care Management  02/06/2019  [name] 1940-11-02 [id number]   [facility] BSW case closure due to inability to contact.  Ronn Melena, BSW Social Worker [phone]

## 2019-02-07 ENCOUNTER — Other Ambulatory Visit: Payer: Self-pay | Admitting: Cardiology

## 2019-02-07 ENCOUNTER — Ambulatory Visit: Payer: Self-pay | Admitting: Pharmacist

## 2019-02-27 ENCOUNTER — Other Ambulatory Visit: Payer: Self-pay | Admitting: Pharmacy Technician

## 2019-02-27 ENCOUNTER — Telehealth: Payer: Self-pay | Admitting: Internal Medicine

## 2019-02-27 MED ORDER — METFORMIN HCL ER 750 MG PO TB24: 750.0000 mg | ORAL_TABLET | Freq: Every day | ORAL | 1 refills | Status: DC

## 2019-02-27 NOTE — Telephone Encounter (Signed)
Copied from [place] [phone]. Topic: General - Inquiry >> Feb 27, 2019  3:53 PM Richardo Priest, NT wrote: Reason for CRM: Patient called in stating she received a letter from pharmacy stating metFORMIN (GLUCOPHAGE-XR) 500 MG 24 hr tablet, has been recalled. Patient is wanting to know what to do next in regards to an alternative. Please advise. Call back is [phone].

## 2019-02-27 NOTE — Patient Outreach (Signed)
[facility] [facility]) Care Management  02/27/2019  LEORA BONA [birth year] [id number]    Successful call placed to patient regarding patient assistance application(s) for [name], Jardiance, Lyrica, Ozempic, Zetia and [name] , HIPAA identifiers verified. Ms. Olliver confirms that she received the patient assistance applications. She states that she hadn't completely filled them out due to all the information they were requesting. Ms. Lehane also states she does not have a fax machine. Reviewed applications and documents needed for applications and informed her that she would not have to fax the applications to companies herself that I would be doing that. Ms. Sabin states that she now understands and states that she "will do her best to get the information" back into me.  Follow up:  Will submit applications and required documents into companies once all documents have been received.  [name] [name] [place] Certified Pharmacy Technician [place] Management Direct Dial:[phone]

## 2019-02-27 NOTE — Telephone Encounter (Signed)
Lets stop the current medication she is on and start metformin 750 XR which is also an extended release but at a slightly higher dose.  This will hopefully lower her sugar slightly.  These are not on recall.  [facility]

## 2019-02-27 NOTE — Addendum Note (Signed)
Addended by: Binnie Rail on: 02/27/2019 04:38 PM   Modules accepted: Orders

## 2019-02-28 NOTE — Telephone Encounter (Signed)
Spoke with patient and info given. In the meantime she is inquiring if you would be willing to take her son on as a new patient for her? He was a previous patient of Ashleigh. Patient informed that I would ask to see if you were currently accepting or able to take on any more patients and call her back. Please advise.  (Son's info) MRN# [id number]  [name] DOB: 07/25/1963

## 2019-02-28 NOTE — Telephone Encounter (Signed)
Yes, I will accept him - he needs an appt to get established

## 2019-02-28 NOTE — Telephone Encounter (Signed)
Left pt vm to call back to schedule appt

## 2019-03-05 ENCOUNTER — Other Ambulatory Visit: Payer: Self-pay | Admitting: Pharmacy Technician

## 2019-03-05 NOTE — Patient Outreach (Addendum)
[place] [facility]) Care Management  03/05/2019  [name] 1940/08/21 [id number]   Incoming call from Ms. Weidler verifying documents needed to submit applications. Also provided Ms. Ahlin with [facility] fax number, she states she will try to fax documents in today. Informed Ms. Guile that [employer] for [employer] HFA would still need to be mailed back in due to company requiring original signature on documents.  [name] [name] [place] Certified Pharmacy Technician [place] Management Direct Dial:[phone]

## 2019-03-06 ENCOUNTER — Other Ambulatory Visit: Payer: Self-pay | Admitting: Pharmacy Technician

## 2019-03-06 NOTE — Patient Outreach (Signed)
[place] [facility]) Care Management  03/06/2019  FADUMA EIDE Mar 17, 1941 [id number]   Received patient portion(s) of patient assistance application(s) for [name], Jardiance, Lyrica and Ozempic.   Refaxed provider portion of application to Dr. Quay Burow office for completion.  Will fax to companies once all documents have been received.  [name] [name] [place] Certified Pharmacy Technician [place] Management Direct Dial:[phone]

## 2019-03-08 ENCOUNTER — Other Ambulatory Visit: Payer: Self-pay | Admitting: Internal Medicine

## 2019-03-11 ENCOUNTER — Other Ambulatory Visit: Payer: Self-pay | Admitting: Pharmacy Technician

## 2019-03-11 NOTE — Patient Outreach (Signed)
[place] [facility]) Care Management  03/11/2019  [name] 1941-06-04 [id number]   Received provider portion(s) of patient assistance application(s) for [name], Jardiance, Lyrica and Ozempic. Faxed completed application and required documents into companies.  Will follow up with company(ies) in 5-7 business days to check status of application(s).  [name] [name] [place] Certified Pharmacy Technician [place] Management Direct Dial:[phone]

## 2019-03-14 ENCOUNTER — Other Ambulatory Visit: Payer: Self-pay | Admitting: Pharmacy Technician

## 2019-03-14 NOTE — Patient Outreach (Signed)
[place] [facility]) Care Management  03/14/2019  [name] 1941-05-01 [id number]    Follow up call placed to [place] regarding patient assistance application(s) for [name] confirms patient has been approved as of 9/15 until 06/27/19. Medication to arrive at patients home in 10-14 business days.   Follow up call placed to [facility] regarding patient assistance application(s) for [name] , [name] confirms patient has been approved as of 9/16 until 06/27/19. Medication to arrive at patients home in 7-10 business days.   Follow up call placed to [name] regarding patient assistance application(s) for [name] confirms patient has been approved as of 9/16 until 06/27/19. Medication to arrive at providers office in 10-14 business days.   Follow up call placed to [place] regarding patient assistance application(s) for Lyrica , Olivia Mackie confirms application has been received but patient's government issued was not included.   Follow up:  Will follow up with patient in 1-2 business days to update and inform of above details.  [name] [name] [place] Certified Pharmacy Technician [place] Management Direct Dial:[phone]

## 2019-03-15 ENCOUNTER — Other Ambulatory Visit: Payer: Self-pay | Admitting: Pharmacy Technician

## 2019-03-15 ENCOUNTER — Other Ambulatory Visit: Payer: Self-pay | Admitting: Neurology

## 2019-03-15 NOTE — Patient Outreach (Signed)
[place] [facility]) Care Management  03/15/2019  AFUA BOST [birth year] [id number]    Successful call placed to patient regarding patient assistance update for Ozempic, Jardiance, [name] and Lyrica, HIPAA identifiers verified.   Patient stated that the [employer] application for Zetia and [name] had been returned in the mail to her. Provided her with [facility] physical address to mail too. Also informed her that I need a copy of her government issued ID for the Lyrica application. She states that she will mail it in with application.  Follow up:  Will follow up with patient in 10-14 business days to verify Jardiance, [name] and Ozempic have been received.  [name] [name] [place] Certified Pharmacy Technician [place] Management Direct [phone]

## 2019-03-18 ENCOUNTER — Telehealth: Payer: Self-pay

## 2019-03-18 NOTE — Telephone Encounter (Signed)
LVM for pt to call back. Need to talk to her about pt assistance and medications needing to be picked up.

## 2019-03-19 ENCOUNTER — Other Ambulatory Visit: Payer: Self-pay | Admitting: Internal Medicine

## 2019-03-19 NOTE — Telephone Encounter (Signed)
Spoke with pt to let her know ozempic is ready for pick up.

## 2019-03-19 NOTE — Telephone Encounter (Signed)
Pt returning call to [facility]. Please call pt

## 2019-03-22 ENCOUNTER — Other Ambulatory Visit: Payer: Self-pay | Admitting: Pharmacy Technician

## 2019-03-22 NOTE — Patient Outreach (Signed)
[place] [facility]) Care Management  03/22/2019  KIZZIE COY 10/04/1940 [id number]   Received patient portion(s) of patient assistance application(s) for Zetia and [name]. Prepared to mail completed application and required documents into [employer].  Faxed copy of patients government issued id into [employer] to [employer] application.   Will follow up with company(ies) in 10-14 business days to check status of application(s).  [name] [name] [place] Certified Pharmacy Technician [place] Management Direct Dial:[phone]

## 2019-03-26 ENCOUNTER — Other Ambulatory Visit: Payer: Self-pay | Admitting: Pharmacy Technician

## 2019-03-26 NOTE — Patient Outreach (Signed)
[place] [facility]) Care Management  03/26/2019  [name] April 04, 1941 [id number]    Follow up call placed to [place] regarding patient assistance application(s) for Lyrica , [name] confirms patient has been approved as of 9/25 until 06/27/19. Medication to arrive at patients home in 7-10 business days.  Follow up:  Will follow up with patient in once update has been received from [employer] for Zetia and [name].  [name] [name] Certified Pharmacy Technician [place] Management Direct [phone]

## 2019-04-08 ENCOUNTER — Other Ambulatory Visit: Payer: Self-pay | Admitting: Pharmacy Technician

## 2019-04-08 NOTE — Patient Outreach (Signed)
[place] [facility]) Care Management  04/08/2019  SELINE ADAMIAK 1941-01-16 [id number]    Follow up call placed to [employer] regarding patient assistance application(s) for Zetia and [name] , Anderson [place] confirms application has been received and attestation from was mailed out to patient on 10/8.   04/09/2019   Successful call placed to patient regarding patient assistance receipt of attestation form from [employer] for [name] and Zetia, HIPAA identifiers verified. [name] states that she has not received attestation yet. Requested that she contact me when she does so that I may assist her with completing form.  [name] confirms that she has received following medications from companies: [place] ([employer]), [place] ([name]), Jardiance ([employer]) and Forensic psychologist)  Follow up:  Will follow up with patient in 3-5 business days if she has not contacted me in referance to attestation form.  [name] [name] [place] Certified Pharmacy Technician [place] Management Direct Dial:[phone]

## 2019-04-10 ENCOUNTER — Encounter: Payer: Self-pay | Admitting: Gastroenterology

## 2019-04-15 ENCOUNTER — Other Ambulatory Visit: Payer: Self-pay | Admitting: Internal Medicine

## 2019-04-15 ENCOUNTER — Telehealth: Payer: Self-pay | Admitting: Internal Medicine

## 2019-04-15 DIAGNOSIS — Z1231 Encounter for screening mammogram for malignant neoplasm of breast: Secondary | ICD-10-CM

## 2019-04-15 MED ORDER — BLOOD GLUCOSE MONITOR KIT: PACK | 0 refills | Status: DC

## 2019-04-15 NOTE — Telephone Encounter (Signed)
Pt is requesting a Rx for One Touch test strips and the meter.    [facility]   [id number] [place], [place] [phone] (Phone) [phone] (Fax)

## 2019-04-15 NOTE — Telephone Encounter (Signed)
Rx faxed

## 2019-04-22 ENCOUNTER — Other Ambulatory Visit: Payer: Self-pay | Admitting: Pharmacy Technician

## 2019-04-22 NOTE — Patient Outreach (Signed)
[place] [facility]) Care Management  04/22/2019  [name] 1940/12/10 [id number]    Successful call placed to patient regarding patient assistance receipt of attestation form from [place] for Zetia and [name], HIPAA identifiers verified. Assisted patient with completing attestation form, patient states she will place in the mail today or tomorrow.  Patient also states she is in the process of moving in with her son. She states that her contact numbers will remain the same and provided me with updated mailing address.  Follow up:  Will follow up with [employer] in 10-14 business days to check application status.  [name] [name] [place] Certified Pharmacy Technician [place] Management Direct Dial:[phone]

## 2019-04-25 ENCOUNTER — Other Ambulatory Visit: Payer: Self-pay

## 2019-04-25 NOTE — Patient Outreach (Signed)
  [place] [facility]) Care Management Chronic Special Needs Program    04/25/2019  Name: [name], DOB: 03/02/1941  MRN: [id number]   Ms. [name] is enrolled in a chronic special needs plan for Diabetes. Client called for scheduled follow up call.  States it is not a good time to talk and requests RNCM to call back sometime next week. Plan to follow up in one week [place], [facility], [place] Management [phone]

## 2019-05-02 ENCOUNTER — Other Ambulatory Visit: Payer: Self-pay

## 2019-05-02 NOTE — Patient Outreach (Signed)
  [place] [facility] Care Management Chronic Special Needs Program  05/02/2019  Name: ELIYANAH LAGORIO DOB: 1940/07/16  MRN: [id number]  Ms. Nami Vanderham is enrolled in a chronic special needs plan for Diabetes. Client called with no answer No answer and HIPAA compliant message left. 2nd attempt Plan for 3rd outreach call in one week Chronic care management coordinator will attempt outreach in one week.   Peter Garter RN, Jackquline [place], CDE Chronic Care Management Coordinator [facility] Care Management [phone]

## 2019-05-08 ENCOUNTER — Other Ambulatory Visit: Payer: Self-pay

## 2019-05-08 NOTE — Patient Outreach (Signed)
[place] [facility]) Care Management Chronic Special Needs Program  05/08/2019  Name: [name] DOB: 02-Oct-1940  MRN: [id number]  [name] is enrolled in a chronic special needs plan for Diabetes.  Client called with no answer and unable to  HIPAA compliant message.  3  attempt  Client has not responded to 3 outreach attempts by [facility] to update individualized care plan   Goals Addressed            This Visit's Progress   .  Acknowledge receipt of Advanced Directive package   On track   . Advanced Care Planning complete by next 9 months(continue 05/08/19)   On track   . Client understands the importance of follow-up with providers by attending scheduled visits   On track   . COMPLETED: Client will report abillity to obtain Medications within 3 months       Receiving pharmacy assistance    . Client will report improved coping by next 6 months   On track   . Client will report no fall or injuries in the next 3 months.   On track   . Client will report no worsening of symptoms related to heart disease within the next 6 months   On track   . Client will use Assistive Devices as needed and verbalize understanding of device use   On track    [employer] approved meters-One Touch, Freestyle or Precision     . Client will verbalize knowledge of self management of Hypertension as evidences by BP reading of 140/90 or less; or as defined by provider   On track   . Decrease inpatient admissions/ readmissions with in the next year   On track   . HEMOGLOBIN A1C < 7.0        Diabetes self management actions:  Glucose monitoring per provider recommendations  Eat Healthy  Check feet daily  Visit provider every 3-6 months as directed  Hbg A1C level every 3-6 months.  Eye Exam yearly    . Maintain timely refills of diabetic medication as prescribed within the year .   On track   . COMPLETED: Obtain annual  Lipid Profile, LDL-C       Completed 02/06/19    .  COMPLETED: Obtain Annual Eye (retinal)  Exam        Completed 11/01/18    . COMPLETED: Obtain Annual Foot Exam       Completed 09/11/18    . COMPLETED: Obtain annual screen for micro albuminuria (urine) , nephropathy (kidney problems)       Completed 02/06/19    . COMPLETED: Obtain Hemoglobin A1C at least 2 times per year       Completed 11/05/18, 02/06/19    . COMPLETED: Visit Primary Care Provider or Endocrinologist at least 2 times per year        Completed 11/05/18, 02/06/19     Client is not meeting diabetes self management goal of hemoglobin A1C of <7% with last reading of 7.4% Client has not responded to 3 outreach attempts by [facility] to update individualized care plan  The client's individualized care plan was developed based on available data and Health Risk Assessment  Client has been working with [employer] scientist (medical) and she is receiving pharmacy assistance Client has not responded to outreach calls from social work   Plan:  Send unsuccessful Economist with a copy of their individualized care plan and Send individual care plan to provider  Chronic care management coordinator will outreach in:  3 Months    Kennedy, [facility] [place] Management Coordinator [place] Management [phone]

## 2019-05-11 ENCOUNTER — Other Ambulatory Visit: Payer: Self-pay | Admitting: Internal Medicine

## 2019-05-14 ENCOUNTER — Other Ambulatory Visit: Payer: Self-pay | Admitting: Pharmacy Technician

## 2019-05-14 NOTE — Patient Outreach (Signed)
[place] [facility]) Care Management  05/14/2019  [name] Jan 31, 1941 [id number]    Follow up call placed to [employer] regarding patient assistance application(s) for Zetia and [name] , [name] states that attestation form has not been mailed back as of yet. Informed patient that form had been mailed back on 10/27. [name] was able to get an exception put in to re-mail out attestation form to patient and for it to be faxed back in to [employer].  Follow up:  Will follow up with patient in 2-3 business days to inform.  [name] [name] [place] Certified Pharmacy Technician [place] Management Direct [phone]

## 2019-05-25 ENCOUNTER — Encounter (INDEPENDENT_AMBULATORY_CARE_PROVIDER_SITE_OTHER): Payer: HMO | Admitting: Ophthalmology

## 2019-05-25 DIAGNOSIS — I10 Essential (primary) hypertension: Secondary | ICD-10-CM | POA: Diagnosis not present

## 2019-05-25 DIAGNOSIS — H20011 Primary iridocyclitis, right eye: Secondary | ICD-10-CM

## 2019-05-25 DIAGNOSIS — H35033 Hypertensive retinopathy, bilateral: Secondary | ICD-10-CM | POA: Diagnosis not present

## 2019-05-29 DIAGNOSIS — H20011 Primary iridocyclitis, right eye: Secondary | ICD-10-CM | POA: Diagnosis not present

## 2019-05-30 ENCOUNTER — Other Ambulatory Visit: Payer: Self-pay

## 2019-05-30 ENCOUNTER — Ambulatory Visit
Admission: RE | Admit: 2019-05-30 | Discharge: 2019-05-30 | Disposition: A | Discharge status: Home / Self Care | Payer: HMO | Source: Ambulatory Visit | Attending: Internal Medicine | Admitting: Internal Medicine

## 2019-05-30 DIAGNOSIS — Z1231 Encounter for screening mammogram for malignant neoplasm of breast: Secondary | ICD-10-CM | POA: Diagnosis not present

## 2019-06-03 DIAGNOSIS — H20011 Primary iridocyclitis, right eye: Secondary | ICD-10-CM | POA: Diagnosis not present

## 2019-06-14 ENCOUNTER — Other Ambulatory Visit: Payer: Self-pay | Admitting: Internal Medicine

## 2019-06-27 ENCOUNTER — Other Ambulatory Visit: Payer: Self-pay | Admitting: Pharmacist

## 2019-06-27 NOTE — Patient Outreach (Signed)
[place] [facility]) Care Management  [place] 06/27/2019  Brenay Neufer 1940-08-11 [id number]  [facility] pharmacy assisted patient with patient assistance program (PAP) applications in [id number] for the following:  [name] (Takeda), Ozempic [employer]), Jardiance (BI), Lyrica Therapist, music), Proventil and [employer] ([employer]).    Patient may apply for renewal in 2021.  Per review of EMR, patient remains on the medications listed above.  Zetia unfortunately is no longer eligible through [employer] PAP.    Plan: I will route patient assistance letter to [place] technician who will coordinate patient assistance program application process for medications listed above.  [facility] pharmacy technician will assist with obtaining all required documents from both patient and provider(s) and submit application(s) once completed.    Ralene Bathe, PharmD, [place] [phone]

## 2019-07-02 ENCOUNTER — Other Ambulatory Visit: Payer: Self-pay | Admitting: Pharmacy Technician

## 2019-07-02 NOTE — Patient Outreach (Signed)
[place] [facility]) Care Management  07/02/2019  [name] May 03, 1941 [id number]    Successful call placed to patient regarding patient assistance re-enrollment foor [name], Jardiance, Lyrica, Ozempic and [name], HIPAA identifiers verified. [name] confirms that she would like to start the re-enrollment process thru, Bernita Buffy, [employer], [employer], [name] and [employer].  Prepared applications to be mailed to patient, faxed provider portion to Dr. Jenness Corner, with 1 interoffice to her for [employer].  Follow up:  Will follow up with patient in 10-14 business days to confirm applications have been received.  [name] [name] Certified Pharmacy Technician [place] Management Direct Dial:[phone]

## 2019-07-08 ENCOUNTER — Other Ambulatory Visit: Payer: Self-pay

## 2019-07-08 MED ORDER — PREGABALIN 75 MG PO CAPS: 75.0000 mg | ORAL_CAPSULE | Freq: Three times a day (TID) | ORAL | 1 refills | Status: DC

## 2019-07-09 ENCOUNTER — Telehealth: Payer: Self-pay

## 2019-07-09 ENCOUNTER — Other Ambulatory Visit: Payer: Self-pay | Admitting: Internal Medicine

## 2019-07-09 MED ORDER — CLONAZEPAM 0.5 MG PO TABS: ORAL_TABLET | ORAL | 0 refills | Status: DC

## 2019-07-09 NOTE — Telephone Encounter (Signed)
Okay to refill.  Sent to pharmacy.

## 2019-07-09 NOTE — Telephone Encounter (Signed)
Copied from [place] [id number]. Topic: General - Call Back - No Documentation >> Jul 09, 2019  1:49 PM Erick Blinks wrote: Reason for CRM: Pt has questions concerning covid vaccine.  Best contact: [phone]

## 2019-07-09 NOTE — Telephone Encounter (Signed)
Routing to dr burns, patient is requesting refill on clonazepam---she has appt scheduled to see you in feb/2021---are you ok with refilling now or do you need to wait until patient is seen, please advise, I will call patient back, thanks

## 2019-07-09 NOTE — Telephone Encounter (Signed)
Last RF was 02/14/18 Last OV 07/18/18 Next OV 08/09/19

## 2019-07-09 NOTE — Telephone Encounter (Signed)
Requested medication (s) are due for refill today: no  Requested medication (s) are on the active medication list: yes  Last refill:  02/05/2018  Future visit scheduled:yes  Notes to clinic:  This refill cannot be delegated    Requested Prescriptions  Pending Prescriptions Disp Refills   clonazePAM (KLONOPIN) 0.5 MG tablet 60 tablet 0    Sig: TAKE 1/2 TO 1 TABLET TWICE A DAY AS NEEDED FOR ANXIETY      Not Delegated - Psychiatry:  Anxiolytics/Hypnotics Failed - 07/09/2019  2:06 PM      Failed - This refill cannot be delegated      Failed - Urine Drug Screen completed in last 360 days.      Passed - Valid encounter within last 6 months    Recent Outpatient Visits           5 months ago Essential hypertension   [place], Claudina Lick, MD   8 months ago Constipation, unspecified constipation type   [place], MD   10 months ago Diabetic polyneuropathy associated with type 2 diabetes mellitus ([place])   [place], Claudina Lick, MD   11 months ago Type 2 diabetes mellitus with diabetic neuropathy, without long-term current use of insulin ([place])   [employer], Claudina Lick, MD   1 year ago Essential hypertension   [place], Claudina Lick, MD       Future Appointments             In 1 month Burns, Claudina Lick, MD [place] at [facility]

## 2019-07-09 NOTE — Telephone Encounter (Signed)
Medication Refill - Medication: clonazePAM (KLONOPIN) 0.5 MG tablet  Has the patient contacted their pharmacy? Yes.   (Agent: If no, request that the patient contact the pharmacy for the refill.) (Agent: If yes, when and what did the pharmacy advise?)  Preferred Pharmacy (with phone number or street name):  [id number], [place] [place] [address]  Phone: [phone] Fax: [phone]    Agent: Please be advised that RX refills may take up to 3 business days. We ask that you follow-up with your pharmacy.

## 2019-07-10 NOTE — Telephone Encounter (Signed)
Patient advised of dr burns note/instructions

## 2019-07-12 ENCOUNTER — Other Ambulatory Visit: Payer: Self-pay | Admitting: Internal Medicine

## 2019-07-15 NOTE — Telephone Encounter (Signed)
Last RF was 05/01/19 Last OV 02/06/19 Next OV 08/09/19

## 2019-07-18 ENCOUNTER — Telehealth: Payer: Self-pay

## 2019-07-18 NOTE — Telephone Encounter (Signed)
FYI

## 2019-07-18 NOTE — Telephone Encounter (Signed)
Copied from [place] [id number]. Topic: General - Inquiry >> Jul 17, 2019  4:23 PM [name] wrote: Reason for CRM: Patient wanted to inform Dr Quay Burow that she did go and get the 1st Vaccine and she will be going to get the second in about 2 weeks.

## 2019-07-24 ENCOUNTER — Other Ambulatory Visit: Payer: Self-pay | Admitting: Pharmacy Technician

## 2019-07-24 NOTE — Patient Outreach (Signed)
[place] [facility]) Care Management  07/24/2019  [name] Jan 08, 1941 [id number]   Received patient portion(s) of patient assistance application(s) for [employer] Garment/textile technologist) [employer] Clinical cytogeneticist) [employer] (Lyrica) [name] (Ozempic) [employer] (Proventil [place]). Faxed completed application and required documents into [place] Garment/textile technologist) [employer] Clinical cytogeneticist) [employer] (Lyrica) [name] (Ozempic). Prepared to mail [name] application to [employer].  Will follow up with company(ies) in 7-21 business days to check status of application(s).  [name] [name] [name] Certified Pharmacy Technician [place] Management Direct Dial:[phone]

## 2019-07-31 ENCOUNTER — Other Ambulatory Visit: Payer: Self-pay

## 2019-07-31 NOTE — Patient Outreach (Signed)
  [place] [facility]) Care Management Chronic Special Needs Program  07/31/2019  Name: Niharika Novakowski DOB: [birth year]  MRN: [id number]  Ms. Leveta Deangelo is enrolled in a chronic special needs plan for Diabetes. Client called with no answer No answer and HIPAA compliant message left. 1st attempt Plan for 2nd outreach call in one week Chronic care management coordinator will attempt outreach in one week.   Peter Garter RN, Jackquline Denmark, CDE Chronic Care Management Coordinator [place] [facility] Care Management [phone]

## 2019-08-05 ENCOUNTER — Other Ambulatory Visit: Payer: Self-pay

## 2019-08-05 NOTE — Patient Outreach (Signed)
  [place] [facility]) Care Management Chronic Special Needs Program  08/05/2019  Name: [name] DOB: April 14, 1941  MRN: [id number]  Ms. [name] is enrolled in a chronic special needs plan for Diabetes. Client called with no answer No answer and HIPAA compliant message left. 2nd attempt Plan for 3rd outreach call in one week Chronic care management coordinator will attempt outreach in one week.   Peter Garter RN, Jackquline Denmark, CDE Chronic Care Management Coordinator [place] [facility] Care Management [phone]

## 2019-08-07 ENCOUNTER — Other Ambulatory Visit: Payer: Self-pay

## 2019-08-07 NOTE — Patient Outreach (Signed)
  [place] [facility]) Care Management Chronic Special Needs Program   08/07/2019  Name: Shakaya Nyborg, DOB: 1941/03/09  MRN: [id number]  The client was discussed in today's interdisciplinary care team meeting.  The following issues were discussed:  Client's needs, Key risk triggers/risk stratification, Care Plan and Issues/barriers to care  Participants present:   Thea Silversmith, MSN, RN, CCM   Zephaniah Lubrano RN,BSN,CCM, CDE  Quinn Plowman RN, BSN, CCM Kelli Churn, RN, CCM, CDE  Maryella Shivers, MD  Bary Castilla, RN, BSN, MS, CCM Coralie Carpen, MD  Recommendations:  Plan to continue to assist with pharmacy assistance for cost  Plan:  Plan to lower to tier 2 with next Interdisciplinary care plan   Follow-up:  RNCM will follow up as scheduled per tier level  Peter Garter RN, Jackquline Denmark, [place] Management [phone]

## 2019-08-08 ENCOUNTER — Other Ambulatory Visit: Payer: Self-pay | Admitting: Cardiology

## 2019-08-09 ENCOUNTER — Ambulatory Visit: Payer: HMO | Admitting: Internal Medicine

## 2019-08-11 NOTE — Progress Notes (Signed)
Subjective:    Patient ID: [name], female    DOB: 04/07/1941, 79 y.o.   MRN: [id number]  HPI The patient is here for follow up of their chronic medical problems, including diabetes, diabetic neuropathy, constipation, hypertension, hyperlipidemia, anxiety, GERD.  She is taking all of her medications as prescribed.   She is exercising regularly - exercises at home.     Right shoulder pain:  She fell on her right side in the fall.  She has pain when she raises the right arm.  She saw ortho and had an injection but it did not help much.  She has decreased ROM.  She had her second Covid vaccine in that arm and had some increased pain after that.  For a while she is also had pain going from the shoulder up into the neck and into her right chest.  This has also been going on for a while.      GERD - pepcid is no longer working.  She needs something stronger.    Constipation - the amitiza does not work. She is no longer taking it.  The linzess works well and she only took it prn.  This was not covered by insurance last year.     Dizziness/lightheadedness:  She feels dizzy/lightheaded today.  She got up with this morning with it.  She has a history vertigo.  She has taken meclizine in the past and that seemed to help.   Medications and allergies reviewed with patient and updated if appropriate.  Patient Active Problem List   Diagnosis Date Noted  . CAD (coronary artery disease) 05/28/2018  . Abnormal CT scan, heart   . Neck pain 07/21/2017  . Decreased hearing of right ear 02/01/2017  . Primary osteoarthritis of left knee 01/12/2017  . Constipation 01/10/2017  . Hypercalcemia 10/12/2016  . Chest pain 10/12/2016  . DOE (dyspnea on exertion) 10/12/2016  . Chronic back pain 08/23/2016  . Hair loss 08/23/2016  . Diabetes ([place]) 07/26/2016  . Anxiety 09/14/2015  . Diverticulitis large intestine w/o perforation or abscess w/o bleeding 05/12/2014  . Essential hypertension  05/12/2014  . Diabetic neuropathy ([place]) 05/12/2014  . Seizure disorder ([place]) 05/12/2014  . Hepatic steatosis 05/12/2014  . Cerebrovascular disease, unspecified 03/13/2013  . Allergic rhinitis 03/13/2013  . GOITER, MULTINODULAR 05/18/2009  . VERTIGO 04/13/2009  . Irritable bowel syndrome 03/20/2009  . INSOMNIA 03/20/2009  . Hyperlipidemia 03/18/2009  . GERD 03/18/2009    Current Outpatient Medications on File Prior to Visit  Medication Sig Dispense Refill  . ACCU-CHEK AVIVA PLUS test strip USE TO CHECK BLOOD SUGARS  TWO TIMES DAILY 200 each 2  . albuterol (PROAIR HFA) 108 (90 BASE) MCG/ACT inhaler Inhale 2 puffs into the lungs every 6 (six) hours as needed for wheezing or shortness of breath.     . Alpha-D-Galactosidase (BEANO PO) Take 1-2 tablets by mouth daily as needed (for gas).    Marland Kitchen aspirin EC 81 MG tablet Take 1 tablet (81 mg total) by mouth daily. 90 tablet 3  . azelastine (ASTELIN) 0.1 % nasal spray Place 2 sprays into both nostrils 2 (two) times daily as needed for rhinitis. Use in each nostril as directed    . blood glucose meter kit and supplies KIT Dispense based on patient and insurance preference. Use up to four times daily as directed. E11.9. 1 each 0  . clonazePAM (KLONOPIN) 0.5 MG tablet TAKE 1/2 TO 1 TABLET TWICE A DAY AS NEEDED  FOR ANXIETY 60 tablet 0  . ezetimibe (ZETIA) 10 MG tablet TAKE 1 TABLET BY MOUTH EVERY DAY 90 tablet 1  . hydrochlorothiazide (HYDRODIURIL) 25 MG tablet TAKE 1 TABLET BY MOUTH EVERY DAY 90 tablet 1  . JARDIANCE 10 MG TABS tablet TAKE 1 TABLET BY MOUTH EVERY DAY 30 tablet 2  . KLOR-CON M20 20 MEQ tablet TAKE 1 TABLET (20 MEQ TOTAL) BY MOUTH 3 (THREE) TIMES DAILY. 270 tablet 1  . Multiple Vitamin (MULTIVITAMIN) capsule Take 1 capsule by mouth daily.     . nitroGLYCERIN (NITROSTAT) 0.4 MG SL tablet Place 1 tablet (0.4 mg total) under the tongue every 5 (five) minutes as needed for chest pain. 25 tablet 2  . OXcarbazepine (TRILEPTAL) 150 MG tablet  TAKE 1 TABLET BY MOUTH TWICE A DAY 180 tablet 1  . polyethylene glycol (MIRALAX / GLYCOLAX) packet Take 17 g by mouth daily. (Patient taking differently: Take 17 g by mouth as needed for moderate constipation. ) 14 each 0  . pregabalin (LYRICA) 75 MG capsule TAKE 1 CAPSULE (75 MG TOTAL) BY MOUTH 3 (THREE) TIMES DAILY. 90 capsule 0  . Probiotic Product (PROBIOTIC PO) Take 1 capsule by mouth daily after breakfast.     . rosuvastatin (CRESTOR) 5 MG tablet TAKE 1 TABLET BY MOUTH EVERY DAY 90 tablet 1  . Semaglutide,0.25 or 0.5MG/DOS, (OZEMPIC, 0.25 OR 0.5 MG/DOSE,) 2 MG/1.5ML SOPN Inject 0.5 mg into the skin once a week. 1 pen 5  . senna-docusate (SENOKOT-S) 8.6-50 MG tablet Take 2 tablets by mouth 2 (two) times daily. (Patient taking differently: Take 2 tablets by mouth 2 (two) times daily. Taking 1-2x daily PRN) 60 tablet 0  . [DISCONTINUED] Calcium Carbonate (CALCIUM 500 PO) Take 1 capsule by mouth every other day.      No current facility-administered medications on file prior to visit.    Past Medical History:  Diagnosis Date  . Allergic rhinitis, cause unspecified 03/13/2013  . Anxiety   . Cerebrovascular disease, unspecified 03/13/2013   Atrophy and small vessel dz noted, MR brain 2009  . Depression   . Diabetes mellitus, type 2 ([place])   . Diverticulosis of colon 03/2010 hosp  . Dyslipidemia   . GERD (gastroesophageal reflux disease)   . Hypertension   . Neuropathy   . OSA on CPAP   . Osteoarthritis of shoulder region    and Knee  . Seizure disorder ([place])    onset 11 years ago; repeated 2013    Past Surgical History:  Procedure Laterality Date  . ABDOMINAL HYSTERECTOMY  1970's   Partial  . APPENDECTOMY    . CHOLECYSTECTOMY    . LEFT HEART CATH AND CORONARY ANGIOGRAPHY N/A 05/28/2018   Procedure: LEFT HEART CATH AND CORONARY ANGIOGRAPHY;  Surgeon: Belva Crome, MD;  Location: [place] [facility];  Service: Cardiovascular;  Laterality: N/A;  . LUMBAR EPIDURAL INJECTION Left  08/25/2017  . SHOULDER SURGERY  2008   LT, post fall   . TONSILLECTOMY AND ADENOIDECTOMY    . TOTAL KNEE ARTHROPLASTY Left 01/12/2017   Procedure: LEFT TOTAL KNEE ARTHROPLASTY;  Surgeon: Susa Day, MD;  Location: [facility];  Service: Orthopedics;  Laterality: Left;  120 mins    Social History   Socioeconomic History  . Marital status: Widowed    Spouse name: Not on file  . Number of children: 2  . Years of education: Not on file  . Highest education level: Not on file  Occupational History  . Occupation: retired  Tobacco Use  . Smoking status: Former Smoker    Quit date: 10/19/1985    Years since quitting: 33.8  . Smokeless tobacco: Never Used  Substance and Sexual Activity  . Alcohol use: Yes    Alcohol/week: 0.0 standard drinks    Comment: occasionally   . Drug use: No  . Sexual activity: Never  Other Topics Concern  . Not on file  Social History Narrative   Patient lives in a one story home.  Has 2 children.  Retired from [employer].   Social Determinants of Health   Financial Resource Strain:   . Difficulty of Paying Living Expenses: Not on file  Food Insecurity:   . Worried About Charity fundraiser in the Last Year: Not on file  . Ran Out of Food in the Last Year: Not on file  Transportation Needs:   . Lack of Transportation (Medical): Not on file  . Lack of Transportation (Non-Medical): Not on file  Physical Activity:   . Days of Exercise per Week: Not on file  . Minutes of Exercise per Session: Not on file  Stress:   . Feeling of Stress : Not on file  Social Connections:   . Frequency of Communication with Friends and Family: Not on file  . Frequency of Social Gatherings with Friends and Family: Not on file  . Attends Religious Services: Not on file  . Active Member of Clubs or Organizations: Not on file  . Attends Archivist Meetings: Not on file  . Marital Status: Not on file    Family History  Problem Relation Age of Onset  . Arthritis  Mother   . Heart disease Father   . Arthritis Other        Grandmother  . Diabetes Other        Grandmother    Review of Systems  Constitutional: Negative for chills and fever.  Respiratory: Positive for shortness of breath. Negative for cough and wheezing.   Cardiovascular: Negative for chest pain, palpitations and leg swelling.  Neurological: Positive for dizziness, light-headedness and headaches.  Psychiatric/Behavioral: The patient is nervous/anxious.        Objective:   Vitals:   08/12/19 1317  BP: 124/74  Pulse: 72  Resp: 16  Temp: 98.4 F (36.9 C)  SpO2: 94%   BP Readings from Last 3 Encounters:  08/12/19 124/74  02/06/19 (!) 128/58  11/05/18 130/72   Wt Readings from Last 3 Encounters:  08/12/19 151 lb (68.5 kg)  02/06/19 158 lb 1.3 oz (71.7 kg)  11/05/18 164 lb (74.4 kg)   Body mass index is 27.62 kg/m.   Physical Exam    Constitutional: Appears well-developed and well-nourished. No distress.  HENT:  Head: Normocephalic and atraumatic.  Neck: Neck supple. No tracheal deviation present. No thyromegaly present.  No cervical lymphadenopathy Cardiovascular: Normal rate, regular rhythm and normal heart sounds.  No murmur heard. No carotid bruit .  No edema Pulmonary/Chest: Effort normal and breath sounds normal. No respiratory distress. No has no wheezes. No rales.  Skin: Skin is warm and dry. Not diaphoretic.  Psychiatric: Normal mood and affect. Behavior is normal.      Assessment & Plan:    See Problem List for Assessment and Plan of chronic medical problems.    This visit occurred during the SARS-CoV-2 public health emergency.  Safety protocols were in place, including screening questions prior to the visit, additional usage of staff PPE, and extensive cleaning of exam room  while observing appropriate contact time as indicated for disinfecting solutions.

## 2019-08-11 NOTE — Patient Instructions (Addendum)
  Blood work was ordered.     Medications reviewed and updated.  Changes include :   Start omeprazole for your heartburn.  I sent linzess to your pharmacy to see if it is covered.   Take meclizine as needed for your dizziness/lightheadedness.  Your prescription(s) have been submitted to your pharmacy. Please take as directed and contact our office if you believe you are having problem(s) with the medication(s).   Please followup in 6 months

## 2019-08-12 ENCOUNTER — Ambulatory Visit (INDEPENDENT_AMBULATORY_CARE_PROVIDER_SITE_OTHER): Payer: HMO | Admitting: Internal Medicine

## 2019-08-12 ENCOUNTER — Encounter: Payer: Self-pay | Admitting: Internal Medicine

## 2019-08-12 ENCOUNTER — Other Ambulatory Visit: Payer: Self-pay

## 2019-08-12 VITALS — BP 124/74 | HR 72 | Temp 98.4°F | Resp 16 | Ht 62.0 in | Wt 151.0 lb

## 2019-08-12 DIAGNOSIS — I10 Essential (primary) hypertension: Secondary | ICD-10-CM | POA: Diagnosis not present

## 2019-08-12 DIAGNOSIS — K219 Gastro-esophageal reflux disease without esophagitis: Secondary | ICD-10-CM

## 2019-08-12 DIAGNOSIS — R42 Dizziness and giddiness: Secondary | ICD-10-CM | POA: Diagnosis not present

## 2019-08-12 DIAGNOSIS — F419 Anxiety disorder, unspecified: Secondary | ICD-10-CM | POA: Diagnosis not present

## 2019-08-12 DIAGNOSIS — K5900 Constipation, unspecified: Secondary | ICD-10-CM

## 2019-08-12 DIAGNOSIS — E782 Mixed hyperlipidemia: Secondary | ICD-10-CM

## 2019-08-12 DIAGNOSIS — E1142 Type 2 diabetes mellitus with diabetic polyneuropathy: Secondary | ICD-10-CM

## 2019-08-12 LAB — HEMOGLOBIN A1C: Hgb A1c MFr Bld: 6.9 % — ABNORMAL HIGH (ref 4.6–6.5)

## 2019-08-12 LAB — COMPREHENSIVE METABOLIC PANEL
ALT: 27 U/L (ref 0–35)
AST: 28 U/L (ref 0–37)
Albumin: 4.3 g/dL (ref 3.5–5.2)
Alkaline Phosphatase: 62 U/L (ref 39–117)
BUN: 15 mg/dL (ref 6–23)
CO2: 30 mEq/L (ref 19–32)
Calcium: 10.5 mg/dL (ref 8.4–10.5)
Chloride: 96 mEq/L (ref 96–112)
Creatinine, Ser: 0.71 mg/dL (ref 0.40–1.20)
GFR: 96.68 mL/min (ref >60.00)
Glucose, Bld: 141 mg/dL — ABNORMAL HIGH (ref 70–99)
Potassium: 3.3 mEq/L — ABNORMAL LOW (ref 3.5–5.1)
Sodium: 135 mEq/L (ref 135–145)
Total Bilirubin: 0.3 mg/dL (ref 0.2–1.2)
Total Protein: 7.4 g/dL (ref 6.0–8.3)

## 2019-08-12 LAB — LIPID PANEL
Cholesterol: 129 mg/dL (ref 0–200)
HDL: 55.6 mg/dL (ref >39.00)
LDL Cholesterol: 46 mg/dL (ref 0–99)
NonHDL: 73.05
Total CHOL/HDL Ratio: 2
Triglycerides: 133 mg/dL (ref 0.0–149.0)
VLDL: 26.6 mg/dL (ref 0.0–40.0)

## 2019-08-12 MED ORDER — MECLIZINE HCL 12.5 MG PO TABS: 12.5000 mg | ORAL_TABLET | Freq: Three times a day (TID) | ORAL | 0 refills | Status: DC | PRN

## 2019-08-12 MED ORDER — LINACLOTIDE 145 MCG PO CAPS: 145.0000 ug | ORAL_CAPSULE | Freq: Every day | ORAL | 5 refills | Status: DC | PRN

## 2019-08-12 MED ORDER — OMEPRAZOLE 20 MG PO CPDR: 20.0000 mg | DELAYED_RELEASE_CAPSULE | Freq: Every day | ORAL | 3 refills | Status: DC

## 2019-08-12 NOTE — Assessment & Plan Note (Signed)
Chronic, situational Takes 1/2 clonazepam prn only No side effects, effective Will continue

## 2019-08-12 NOTE — Assessment & Plan Note (Signed)
Chronic Pepcid was not effective and she discontinued it Start omeprazole 20 mg daily

## 2019-08-12 NOTE — Assessment & Plan Note (Signed)
Chronic Currently taking Lyrica 3 times a day and it does help Continue current dose of Lyrica We will check A1c to make sugars are well controlled

## 2019-08-12 NOTE — Assessment & Plan Note (Signed)
Chronic, intermittent-today started to have an acute flare Has taken meclizine in the past and did well with that Prescription sent to pharmacy-take as needed Call if no improvement

## 2019-08-12 NOTE — Assessment & Plan Note (Signed)
Chronic Check lipid panel, cmp Continue daily statin, zetia Regular exercise and healthy diet encouraged

## 2019-08-12 NOTE — Assessment & Plan Note (Signed)
Chronic Check a1c Low sugar / carb diet Stressed regular exercise Continue current medication - will adjust based on a1c

## 2019-08-12 NOTE — Assessment & Plan Note (Addendum)
amitiza not working - no longer taking linzess was working - would take it only prn - maybe 2/month-resent to pharmacy hopefully it is covered this year Some samples given May need to refer to GI if linzess not effective

## 2019-08-12 NOTE — Assessment & Plan Note (Signed)
Chronic BP well controlled Current regimen effective and well tolerated Continue current medications at current doses cmp  

## 2019-08-13 ENCOUNTER — Other Ambulatory Visit: Payer: Self-pay

## 2019-08-13 NOTE — Patient Outreach (Signed)
[facility]) Care Management Chronic Special Needs Program  08/13/2019  Name: [name] DOB: 04/05/1941  MRN: [id number]  Ms. [name] is enrolled in a chronic special needs plan for Diabetes. Chronic Care Management Coordinator telephoned client to review health risk assessment and to develop individualized care plan.  Introduced the chronic care management program, importance of client participation, and taking their care plan to all provider appointments and inpatient facilities.  Reviewed the transition of care process and possible referral to community care management.  Subjective: Client states that she has moved in with her son last fall and she is coping with her diabetes and medications much better.  States that she saw her doctor yesterday and her diabetes is being controlled.  States she can afford her medications now that she gets pharmacy assistance.  States she checks her blood sugars once a day and they have been ranging from 90-107.  Denies any low readings.  States she tires to eat healthy most of the time.  states she would like to start exercising some.  States her B/P is good when it is checked. Denies any chest pains or shortness of breath. Denies any falls since October but she does have dizziness at times.    States she now has her Financial controller.    Goals Addressed            This Visit's Progress   . COMPLETED:  Acknowledge receipt of Advanced Directive package       Goal completed 08/13/19    . COMPLETED: Advanced Care Planning complete by next 9 months(continue 05/08/19)       Client completed Advanced Directives   Encouraged to bring copy to provider  Completed 08/13/19    . Client understands the importance of follow-up with providers by attending scheduled visits   On track    Plan to keep scheduled appointments with providers Reinforced to keep scheduled appointments with providers    . COMPLETED: Client will report  improved coping by next 6 months       Reports coping much better since moving in with son and getting pharmacy assistance Completed 08/13/19    . Client will report no fall or injuries in the next 6 months.   On track    Reports on falls since fall in October Reinforced fall precautions and home safety Stand up slowly Use cane or walker at all times Use grabber to pick up objects on the floor Keep home well-lit and wear your glasses      . Client will report no worsening of symptoms related to heart disease within the next 6 months   On track    Reports no issues with heart disease Reviewed signs and symptoms to call doctor and when to call 911 Follow a low salt diet     . Client will use Assistive Devices as needed and verbalize understanding of device use   On track    Reviewed to have provider send Rx for [employer] approved meters-One Touch, Freestyle or Precision and testing supplies to her pharmacy    . Client will verbalize knowledge of self management of Hypertension as evidences by BP reading of 140/90 or less; or as defined by provider   On track    Reports B/P below 140/90 at provider visits Plan to check B/P regularly Take B/P medications as ordered Plan to follow a low salt diet  Increase activity as tolerated    . COMPLETED:  Decrease inpatient admissions/ readmissions with in the next year       No admissions in 2020 Completed 08/13/19    . Diabetes Patient stated goal walk 15 minutes 3 times a week (pt-stated)      . HEMOGLOBIN A1C < 7.0       Last Hemoglobin A1C 6.9% 08/12/19 Reviewed Diabetes action plan in [employer](HTA) calendar Reinforced to follow a low carbohydrate, low sodium diet Check blood sugars daily either fasting or 1 1/2 hours after eating with goal of 80-130 fasting and 180 or less after meals and record in calendar log sheets    . Maintain timely refills of diabetic medication as prescribed within the year .   On track     Maintaining timely refills of medications per dispense report Reinforced importance of getting medications refilled on time    . COMPLETED: Obtain annual  Lipid Profile, LDL-C   On track    Last completed 08/12/19 LDL 46 The goal for LDL is less than 70 mg/dL as you are at high risk for complications    . Obtain Annual Eye (retinal)  Exam    On track    Last completed 11/01/18 Plan to have a dilated eye exam every year    . Obtain Annual Foot Exam   On track    Last completed 09/11/18 Check your skin and feet every day for cuts, bruises, redness, blisters, or sores. Schedule a foot exam with your health care provider once every year    . Obtain annual screen for micro albuminuria (urine) , nephropathy (kidney problems)   On track    Last completed 02/06/19 It is important for your doctor to check your urine for protein at least every year    . Obtain Hemoglobin A1C at least 2 times per year   On track    Last completed 08/12/19 It is important to have your Hemoglobin A1C checked every 6 months if you are at goal and every 3 months if you are not at goal    . COMPLETED: Patient Stated       Continue to lose weight, [facility] water aerobic, eat healthy and low sugar and carbohydrates. Enjoy life, family, friends and worship God. Completed 08/23/19 goal made by provider 2019 Client has updated goals    . Visit Primary Care Provider or Endocrinologist at least 2 times per year    On track    Primary care provider completed 08/12/19 Last Annual Wellness visit 11/05/18 Please schedule your annual wellness visit     Client is  meeting diabetes self management goal of hemoglobin A1C of <7% with last reading of 6.9% which is improved from 7.4% Client is active with Special educational needs teacher for pharmacy assistance. Client has question about 2021 forms status and messaged Etter Sjogren CPhT to check for client Client moved to Tier 2 from Tier 1 as she reports coping better with diabetes  and has assistance with medications  Plan:  Send successful outreach letter with a copy of their individualized care plan, Send individual care plan to provider and Send educational material-Quarterly  diabetes mailings  Chronic care management coordination will outreach in:  6 Months per Tier level     Peter Garter RN, Jackquline Denmark, [place] [facility] [phone]

## 2019-08-16 ENCOUNTER — Telehealth: Payer: Self-pay

## 2019-08-16 ENCOUNTER — Other Ambulatory Visit: Payer: Self-pay

## 2019-08-16 ENCOUNTER — Other Ambulatory Visit: Payer: Self-pay | Admitting: *Deleted

## 2019-08-16 MED ORDER — ACCU-CHEK AVIVA PLUS VI STRP: ORAL_STRIP | 2 refills | Status: DC

## 2019-08-16 MED ORDER — HYDROCHLOROTHIAZIDE 25 MG PO TABS: 25.0000 mg | ORAL_TABLET | Freq: Every day | ORAL | 1 refills | Status: DC

## 2019-08-16 NOTE — Telephone Encounter (Signed)
Sent in today 

## 2019-08-16 NOTE — Telephone Encounter (Signed)
Medication Requested: ACCU-CHEK AVIVA PLUS test strip  Is medication on med list: Yes  (if no, inform pt they may need an appointment)  Is medication a controled: No (yes = last OV with PCP)  -Controlled Substances: Adderall, Ritalin, oxycodone, hydrocodone, methadone, alprazolam, etc  Last visit with PCP: 2.15.2021   Is the OV > than 4 months: (yes = schedule an appt if one is not already made)  Pharmacy (Name, [place]): [facility] in [place]

## 2019-08-17 ENCOUNTER — Other Ambulatory Visit: Payer: Self-pay | Admitting: Internal Medicine

## 2019-08-19 ENCOUNTER — Other Ambulatory Visit: Payer: Self-pay

## 2019-08-19 ENCOUNTER — Telehealth: Payer: Self-pay | Admitting: Internal Medicine

## 2019-08-19 MED ORDER — OZEMPIC (0.25 OR 0.5 MG/DOSE) 2 MG/1.5ML ~~LOC~~ SOPN: 0.5000 mg | PEN_INJECTOR | SUBCUTANEOUS | 5 refills | Status: DC

## 2019-08-19 NOTE — Telephone Encounter (Signed)
    Patient requesting [name] rx be faxed to patient assistance program  [employer]  Phone [phone] Fax [phone]

## 2019-08-20 ENCOUNTER — Other Ambulatory Visit: Payer: Self-pay | Admitting: Pharmacy Technician

## 2019-08-20 NOTE — Telephone Encounter (Signed)
Not on current list. Is this ok to send?

## 2019-08-20 NOTE — Patient Outreach (Signed)
[name]  08/20/2019  [name] 1940-10-20 [id number]    Follow up call placed to [employer] regarding patient assistance application(s) for [name] confirms patient has been approved as of 2/1 until 06/26/20.    Follow up call placed to [facility]  regarding patient assistance application(s) for [name] , [name] confirms patient has been approved as of 2/6 until 06/26/20.    Follow up call placed to [name]  regarding patient assistance application(s) for Lyrica , [name] confirms patient has been approved as of 1/27 until 06/26/20. Requested a refill be submitted for patient and [name] states medication will be delivered to patient's home on 2/26.   Follow up call placed to [name] regarding patient assistance application(s) for Ozempic , [name] confirms patient has been approved as of 2/24 until 06/26/20. Medication to arrive at Dr. Quay Burow office in 10-14 business days.  Follow up:  Will follow up with patient when I have received an update on application status of [name] thru [employer].  [name] [name] [place] Certified Pharmacy Technician [place] Management Direct Dial:[phone]

## 2019-08-20 NOTE — Telephone Encounter (Signed)
Ok to send - but said this did not work was would prefer linzess - I am assuming linzess is not covered.

## 2019-08-21 MED ORDER — LINACLOTIDE 145 MCG PO CAPS: 145.0000 ug | ORAL_CAPSULE | Freq: Every day | ORAL | 5 refills | Status: DC | PRN

## 2019-08-21 NOTE — Telephone Encounter (Signed)
Rx sent 

## 2019-08-23 ENCOUNTER — Telehealth: Payer: Self-pay

## 2019-08-23 NOTE — Telephone Encounter (Signed)
Patient calling and is requesting a call back from [facility] regarding a accu-chek glucose monitor. Please advise.

## 2019-08-27 MED ORDER — BLOOD GLUCOSE MONITOR KIT: PACK | 0 refills | Status: DC

## 2019-08-27 NOTE — Telephone Encounter (Signed)
Rx for glucometer faxed

## 2019-08-27 NOTE — Telephone Encounter (Signed)
New message:   Rana from [employer] is calling to follow up of the Accu-Check glucose monitor and the strips to go along with it. [id number] - [place], [place]

## 2019-09-13 ENCOUNTER — Other Ambulatory Visit: Payer: Self-pay | Admitting: Internal Medicine

## 2019-09-16 ENCOUNTER — Other Ambulatory Visit: Payer: Self-pay | Admitting: Pharmacist

## 2019-09-16 NOTE — Patient Outreach (Signed)
[facility] ([facility])  [facility] Quality Pharmacy Team    [facility] pharmacy case will be closed as our team is transitioning from the [facility] Care Management Department into the [facility] Quality Department and will no longer be using [facility] for documentation purposes.   [facility] pharmacy technician will continue to assist patient with medication assistance program applications until complete.     Deziya Amero, PharmD, BCPS Clinical Pharmacist [facility] [phone]      

## 2019-09-17 ENCOUNTER — Telehealth: Payer: Self-pay | Admitting: Internal Medicine

## 2019-09-17 ENCOUNTER — Telehealth: Payer: Self-pay

## 2019-09-17 DIAGNOSIS — I679 Cerebrovascular disease, unspecified: Secondary | ICD-10-CM

## 2019-09-17 NOTE — Telephone Encounter (Signed)
Ok to send

## 2019-09-17 NOTE — Progress Notes (Signed)
°  Chronic Care Management   Note  09/17/2019 Name: [name] MRN: [id number] DOB: 11-07-1940  [name] is a 79 y.o. year old female who is a primary care patient of Burns, Claudina Lick, MD. I reached out to [name] by phone today in response to a referral sent by [name]'s PCP, Binnie Rail, MD.   [name] was given information about Chronic Care Management services today including:  1. CCM service includes personalized support from designated clinical staff supervised by her physician, including individualized plan of care and coordination with other care providers 2. 24/7 contact phone numbers for assistance for urgent and routine care needs. 3. Service will only be billed when office clinical staff spend 20 minutes or more in a month to coordinate care. 4. Only one practitioner may furnish and bill the service in a calendar month. 5. The patient may stop CCM services at any time (effective at the end of the month) by phone call to the office staff.   Patient agreed to services and verbal consent obtained.   Follow up plan:   Raynicia Dukes [employer] Scheduler

## 2019-09-17 NOTE — Progress Notes (Signed)
°  Chronic Care Management   Outreach Note  09/17/2019 Name: [name] MRN: [id number] DOB: [birth year]  Referred by: Binnie Rail, MD Reason for referral : No chief complaint on file.   An unsuccessful telephone outreach was attempted today. The patient was referred to the pharmacist for assistance with care management and care coordination.   Follow Up Plan:     Raynicia Dukes [employer] Scheduler

## 2019-09-17 NOTE — Telephone Encounter (Signed)
1.Medication Requested:nitroGLYCERIN (NITROSTAT) 0.4 MG SL tablet  2. Pharmacy (Name, [place], [place]):[id number] [place] [place]  3. On Med List: Yes  4. Last Visit with PCP: 2.15.2021  5. Next visit date with PCP: no appt is made at this time     Agent: Please be advised that RX refills may take up to 3 business days. We ask that you follow-up with your pharmacy.

## 2019-09-17 NOTE — Telephone Encounter (Signed)
yes

## 2019-09-18 MED ORDER — NITROGLYCERIN 0.4 MG SL SUBL: 0.4000 mg | SUBLINGUAL_TABLET | SUBLINGUAL | 2 refills | Status: DC | PRN

## 2019-09-18 NOTE — Telephone Encounter (Signed)
Rx sent 

## 2019-09-18 NOTE — Addendum Note (Signed)
Addended by: Delice Bison E on: 09/18/2019 09:15 AM   Modules accepted: Orders

## 2019-10-04 IMAGING — DX DG CHEST 1V PORT
1 series · 1 of 1 positions shown · non-contrast
Comparison: 04/14/2018

CLINICAL DATA: Chest pain

EXAM:
PORTABLE CHEST 1 VIEW

[chest]
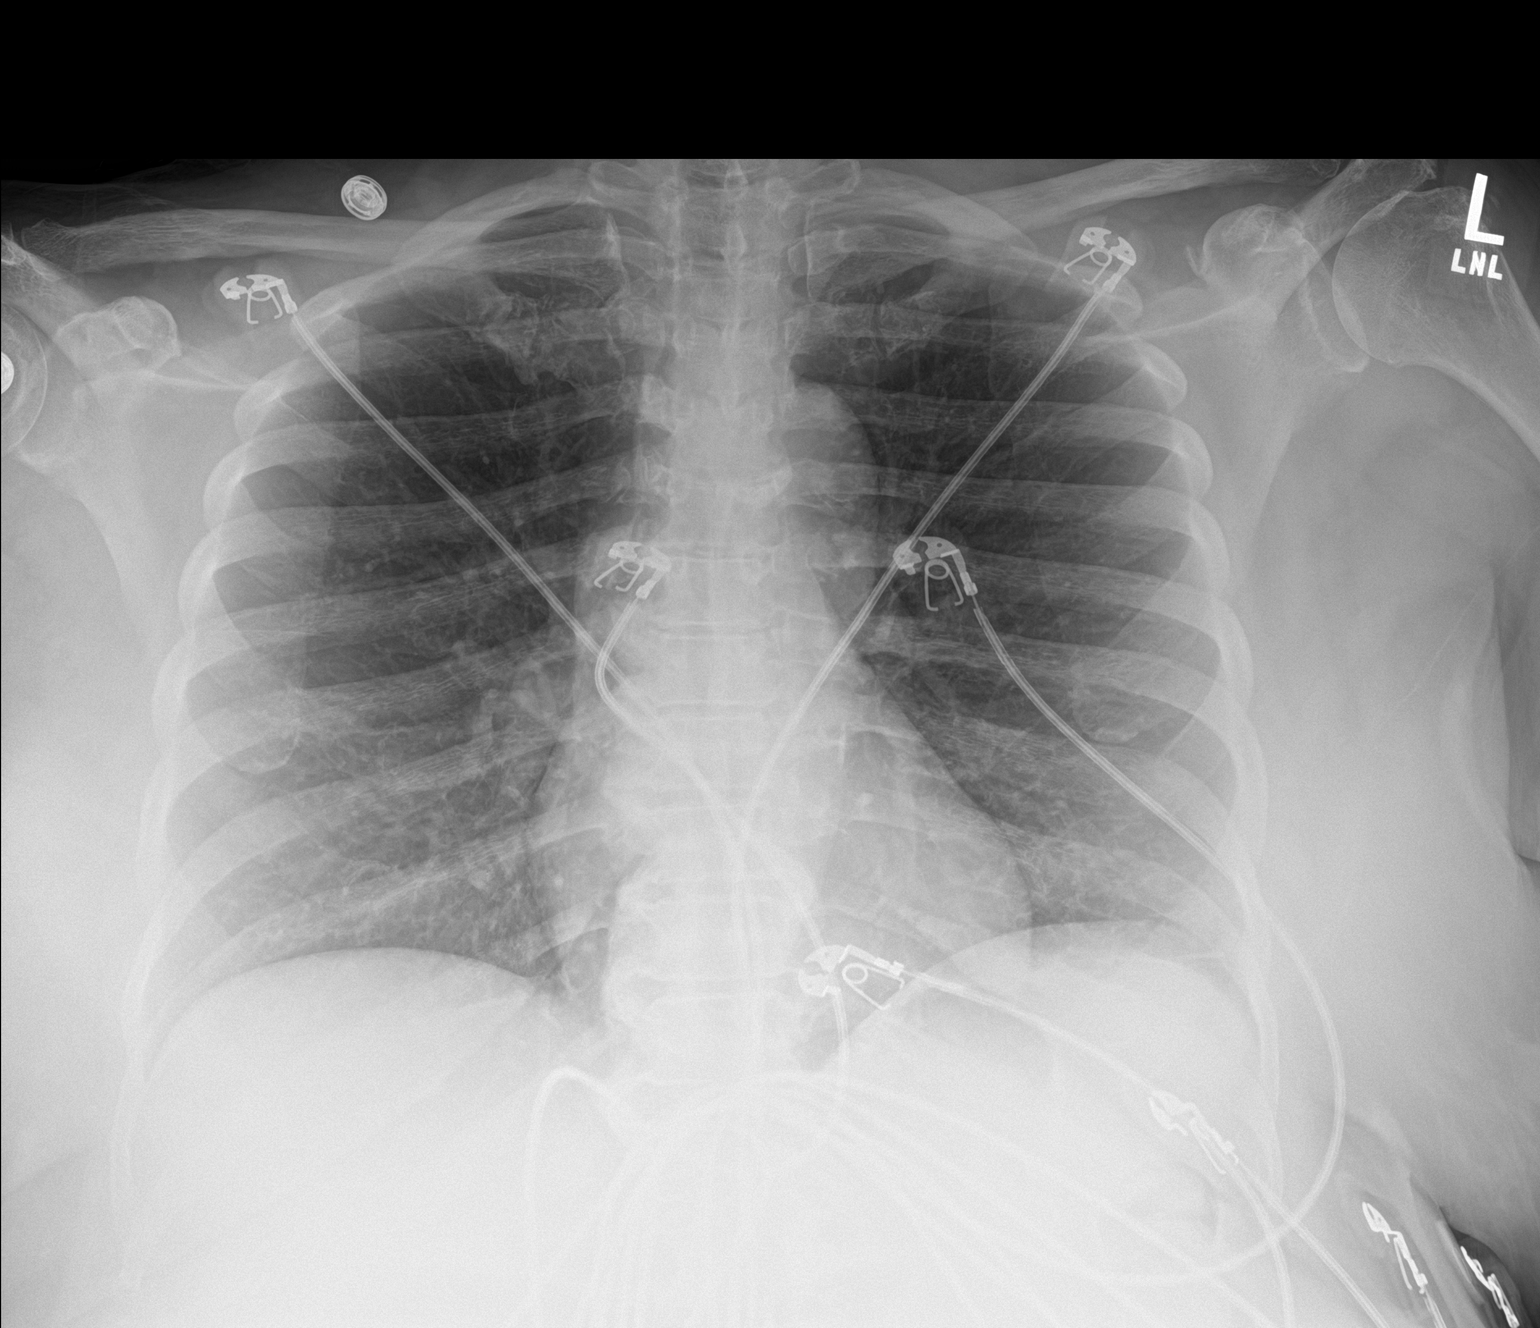

[1 of 1 positions shown; findings below may reference images not displayed]

FINDINGS: The heart size and mediastinal contours are within normal limits.
Both lungs are clear. The visualized skeletal structures are
unremarkable.
IMPRESSION: No active disease.

## 2019-10-07 NOTE — Addendum Note (Signed)
Addended by: Aviva Signs M on: 10/07/2019 10:07 AM   Modules accepted: Orders

## 2019-10-09 ENCOUNTER — Other Ambulatory Visit: Payer: Self-pay | Admitting: Pharmacy Technician

## 2019-10-09 NOTE — Patient Outreach (Signed)
[place] [facility]) Care Management  10/09/2019  [name] [birth year] [id number]   Incoming call from patient requesting the phone number to [facility] to request her refill for Lyrica; provided her with number.  [name] confirms that she has received all medications from companies that we applied for.  -Will remove myself from care team  [name]. [name], [place] Certified Pharmacy Technician [employer]

## 2019-10-10 ENCOUNTER — Telehealth: Payer: HMO

## 2019-10-10 NOTE — Chronic Care Management (AMB) (Deleted)
Chronic Care Management Pharmacy  Name: [name]  MRN: [id number] DOB: 1940-12-27  Chief Complaint/ HPI  [name],  79 y.o. , female presents for their Initial CCM visit with the clinical pharmacist via telephone due to COVID-19 Pandemic.  PCP : Binnie Rail, MD  Their chronic conditions include: HTN, CAD, T2DM, HLD, GERD, IBS, seizure disorder, allergies  Office Visits: 08/12/19 Dr Quay Burow Ok EdwardsRolan Lipa working better than amitiza, gave samples and sent refills. Rx'd meclizine for vertigo. Started omeprazole due to famotidine ineffective.  Consult Visit: n/a  Medications: Outpatient Encounter Medications as of 10/10/2019  Medication Sig  . albuterol (PROAIR HFA) 108 (90 BASE) MCG/ACT inhaler Inhale 2 puffs into the lungs every 6 (six) hours as needed for wheezing or shortness of breath.   . Alpha-D-Galactosidase (BEANO PO) Take 1-2 tablets by mouth daily as needed (for gas).  Marland Kitchen aspirin EC 81 MG tablet Take 1 tablet (81 mg total) by mouth daily.  Marland Kitchen azelastine (ASTELIN) 0.1 % nasal spray Place 2 sprays into both nostrils 2 (two) times daily as needed for rhinitis. Use in each nostril as directed  . blood glucose meter kit and supplies KIT Dispense based on patient and insurance preference. Use up to four times daily as directed. E11.9.  . blood glucose meter kit and supplies KIT Dispense based on patient and insurance preference. Use up to four times daily as directed. (FOR ICD-10: E11.9).  . clonazePAM (KLONOPIN) 0.5 MG tablet TAKE 1/2 TO 1 TABLET TWICE A DAY AS NEEDED FOR ANXIETY  . ezetimibe (ZETIA) 10 MG tablet TAKE 1 TABLET BY MOUTH EVERY DAY  . hydrochlorothiazide (HYDRODIURIL) 25 MG tablet Take 1 tablet (25 mg total) by mouth daily.  Marland Kitchen JARDIANCE 10 MG TABS tablet TAKE 1 TABLET BY MOUTH EVERY DAY  . KLOR-CON M20 20 MEQ tablet TAKE 1 TABLET (20 MEQ TOTAL) BY MOUTH 3 (THREE) TIMES DAILY.  Marland Kitchen linaclotide (LINZESS) 145 MCG CAPS capsule Take 1 capsule (145 mcg  total) by mouth daily as needed (constipation).  . meclizine (ANTIVERT) 12.5 MG tablet Take 1 tablet (12.5 mg total) by mouth 3 (three) times daily as needed for dizziness.  . Multiple Vitamin (MULTIVITAMIN) capsule Take 1 capsule by mouth daily.   . nitroGLYCERIN (NITROSTAT) 0.4 MG SL tablet Place 1 tablet (0.4 mg total) under the tongue every 5 (five) minutes as needed for chest pain.  Marland Kitchen omeprazole (PRILOSEC) 20 MG capsule Take 1 capsule (20 mg total) by mouth daily.  Glory Rosebush ULTRA test strip USE UP TO 4 TIMES DAILY AS DIRECTED  . OXcarbazepine (TRILEPTAL) 150 MG tablet TAKE 1 TABLET BY MOUTH TWICE A DAY  . polyethylene glycol (MIRALAX / GLYCOLAX) packet Take 17 g by mouth daily. (Patient taking differently: Take 17 g by mouth as needed for moderate constipation. )  . pregabalin (LYRICA) 75 MG capsule TAKE 1 CAPSULE (75 MG TOTAL) BY MOUTH 3 (THREE) TIMES DAILY.  . Probiotic Product (PROBIOTIC PO) Take 1 capsule by mouth daily after breakfast.   . rosuvastatin (CRESTOR) 5 MG tablet TAKE 1 TABLET BY MOUTH EVERY DAY  . Semaglutide,0.25 or 0.5MG/DOS, (OZEMPIC, 0.25 OR 0.5 MG/DOSE,) 2 MG/1.5ML SOPN Inject 0.5 mg into the skin once a week.  . senna-docusate (SENOKOT-S) 8.6-50 MG tablet Take 2 tablets by mouth 2 (two) times daily. (Patient taking differently: Take 2 tablets by mouth 2 (two) times daily. Taking 1-2x daily PRN)  . [DISCONTINUED] Calcium Carbonate (CALCIUM 500 PO) Take 1 capsule by mouth  every other day.    No facility-administered encounter medications on file as of 10/10/2019.     Current Diagnosis/Assessment:  Goals Addressed   None     Diabetes   Recent Relevant Labs: Lab Results  Component Value Date/Time   HGBA1C 6.9 (H) 08/12/2019 02:12 PM   HGBA1C 7.4 (H) 02/06/2019 12:20 PM   GFR 96.68 08/12/2019 02:12 PM   GFR 95.26 02/06/2019 12:20 PM   MICROALBUR 6.7 (H) 02/06/2019 12:20 PM   MICROALBUR 2.6 (H) 02/07/2018 04:24 PM    Checking BG: {CHL HP Blood Glucose  Monitoring Frequency:[phone]}  Recent FBG Readings: *** Recent pre-meal BG readings: *** Recent 2hr PP BG readings:  *** Recent HS BG readings: ***  Patient has failed these meds in past: Farxiga, glipizide, metformin Patient is currently {CHL Controlled/Uncontrolled:[phone]} on the following medications: Jardiance 10 mg daily, Ozempic 0.5 mg weekly  Last diabetic Eye exam:  Lab Results  Component Value Date/Time   HMDIABEYEEXA No Retinopathy 11/01/2018 12:00 AM    Last diabetic Foot exam: No results found for: HMDIABFOOTEX   We discussed: {CHL HP Upstream Pharmacy [phone]}  Plan  Continue {CHL HP Upstream Pharmacy Plans:[id number]}   Hypertension   BP today is:  {CHL HP UPSTREAM Pharmacist BP [phone]}  Office blood pressures are  BP Readings from Last 3 Encounters:  08/12/19 124/74  02/06/19 (!) 128/58  11/05/18 130/72    Patient has failed these meds in the past: *** Patient is currently {CHL Controlled/Uncontrolled:[phone]} on the following medications: HCTZ 25 mg daily, potassium chloride 20 meq TID  Patient checks BP at home {CHL HP BP Monitoring Frequency:[phone]}  Patient home BP readings are ranging: ***  We discussed {CHL HP Upstream Pharmacy [phone]}  Plan  Continue {CHL HP Upstream Pharmacy Plans:[id number]}     Hyperlipidemia/CAD   Lipid Panel     Component Value Date/Time   CHOL 129 08/12/2019 1412   TRIG 133.0 08/12/2019 1412   HDL 55.60 08/12/2019 1412   CHOLHDL 2 08/12/2019 1412   VLDL 26.6 08/12/2019 1412   LDLCALC 46 08/12/2019 1412   LDLDIRECT 113.0 07/21/2017 1643     Patient has failed these meds in past: *** Patient is currently {CHL Controlled/Uncontrolled:[phone]} on the following medications: rosuvastatin 5 mg daily, ezetimibe 10 mg daily, aspirin 81 mg daily, nitroglycerin 0.4 mg SL prn  We discussed:  {CHL HP Upstream Pharmacy [phone]}  Plan  Continue  {CHL HP Upstream Pharmacy KGYJE:[id number]}   GERD/GI   Patient has failed these meds in past: *** Patient is currently {CHL Controlled/Uncontrolled:[phone]} on the following medications: omeprazole 20 mg daily, Linzess 145 mcg daily prn, senna-docusate, probiotic, Miralax, Beano  We discussed:  {CHL HP Upstream Pharmacy [phone]}  Plan  Continue {CHL HP Upstream Pharmacy Plans:[id number]}   Seizure disorder   Patient has failed these meds in past: *** Patient is currently {CHL Controlled/Uncontrolled:[phone]} on the following medications: oxcarbazepine 150 mg BID  We discussed:  {CHL HP Upstream Pharmacy [phone]}  Plan  Continue {CHL HP Upstream Pharmacy Plans:[id number]}   Neuropathy   Patient has failed these meds in past: *** Patient is currently {CHL Controlled/Uncontrolled:[phone]} on the following medications: pregabalin 75 mg TID  We discussed:  {CHL HP Upstream Pharmacy [phone]}  Plan  Continue {CHL HP Upstream Pharmacy Plans:[id number]}   Anxiety   Patient is currently {CHL Controlled/Uncontrolled:[phone]} on the following medications: clonazepam 0.5 mg prn  We discussed:  {CHL HP Upstream Pharmacy [phone]}  Plan  Continue {CHL HP Upstream Pharmacy Plans:[id number]}  Dizziness  Patient is currently {CHL Controlled/[phone]} on the following medications: meclizine 12.5 mg TID prn  We discussed:  ***  Plan  Continue {CHL HP Upstream Pharmacy Plans:[phone]}   Health Maintenance   Patient is currently {CHL Controlled/[phone]} on the following medications: multivitamin, Astelin nasal spray, albuterol HFA prn  We discussed:  ***  Plan  Continue {CHL HP Upstream Pharmacy SXQKS:[id number]}   Medication Management   Pt uses *** pharmacy for all medications ***pill box Pt endorses ***% compliance  We discussed: ***  PAPs: Jardiance  (BI) Lyrica Therapist, music) Ozempic Printmaker) Proventil ([employer])  Plan  ***     Follow up: ***

## 2019-10-10 NOTE — Chronic Care Management (AMB) (Signed)
Chronic Care Management Pharmacy  Name: [name]  MRN: [id number] DOB: Feb 11, 1941  Chief Complaint/ HPI  [name],  79 y.o. , female presents for their Initial CCM visit with the clinical pharmacist via telephone due to COVID-19 Pandemic.  PCP : Binnie Rail, MD  Their chronic conditions include: HTN, CAD, T2DM, HLD, GERD, IBS, seizure disorder, allergies, arthritis  Takes care of herself, does get tired doing household chores. Did have a fall in November, still has pain from that. R arm pain, does stretches with both arms.   Trying to get another set of hearing aids.   Office Visits: 08/12/19 Dr Quay Burow Ok EdwardsRolan Lipa working better than amitiza, gave samples and sent refills. Rx'd meclizine for vertigo. Started omeprazole due to famotidine ineffective.  Consult Visit: n/a Dr Maxie Better - cortisone shot, about 3 months ago.   Medications: Outpatient Encounter Medications as of 10/11/2019  Medication Sig  . albuterol (PROAIR HFA) 108 (90 BASE) MCG/ACT inhaler Inhale 2 puffs into the lungs every 6 (six) hours as needed for wheezing or shortness of breath.   . Alpha-D-Galactosidase (BEANO PO) Take 1-2 tablets by mouth daily as needed (for gas).  Marland Kitchen aspirin EC 81 MG tablet Take 1 tablet (81 mg total) by mouth daily.  Marland Kitchen azelastine (ASTELIN) 0.1 % nasal spray Place 2 sprays into both nostrils 2 (two) times daily as needed for rhinitis. Use in each nostril as directed  . blood glucose meter kit and supplies KIT Dispense based on patient and insurance preference. Use up to four times daily as directed. E11.9.  . blood glucose meter kit and supplies KIT Dispense based on patient and insurance preference. Use up to four times daily as directed. (FOR ICD-10: E11.9).  . clonazePAM (KLONOPIN) 0.5 MG tablet TAKE 1/2 TO 1 TABLET TWICE A DAY AS NEEDED FOR ANXIETY  . ezetimibe (ZETIA) 10 MG tablet TAKE 1 TABLET BY MOUTH EVERY DAY  . hydrochlorothiazide (HYDRODIURIL) 25 MG tablet  Take 1 tablet (25 mg total) by mouth daily.  Marland Kitchen JARDIANCE 10 MG TABS tablet TAKE 1 TABLET BY MOUTH EVERY DAY  . KLOR-CON M20 20 MEQ tablet TAKE 1 TABLET (20 MEQ TOTAL) BY MOUTH 3 (THREE) TIMES DAILY. (Patient taking differently: 40 mEq 2 (two) times daily. )  . linaclotide (LINZESS) 145 MCG CAPS capsule Take 1 capsule (145 mcg total) by mouth daily as needed (constipation).  . meclizine (ANTIVERT) 12.5 MG tablet Take 1 tablet (12.5 mg total) by mouth 3 (three) times daily as needed for dizziness.  . Multiple Vitamin (MULTIVITAMIN) capsule Take 1 capsule by mouth daily.   . nitroGLYCERIN (NITROSTAT) 0.4 MG SL tablet Place 1 tablet (0.4 mg total) under the tongue every 5 (five) minutes as needed for chest pain.  Marland Kitchen omeprazole (PRILOSEC) 20 MG capsule Take 1 capsule (20 mg total) by mouth daily.  Glory Rosebush ULTRA test strip USE UP TO 4 TIMES DAILY AS DIRECTED  . OXcarbazepine (TRILEPTAL) 150 MG tablet TAKE 1 TABLET BY MOUTH TWICE A DAY  . pregabalin (LYRICA) 75 MG capsule TAKE 1 CAPSULE (75 MG TOTAL) BY MOUTH 3 (THREE) TIMES DAILY.  . Probiotic Product (PROBIOTIC PO) Take 1 capsule by mouth daily after breakfast.   . rosuvastatin (CRESTOR) 5 MG tablet TAKE 1 TABLET BY MOUTH EVERY DAY  . Semaglutide,0.25 or 0.5MG/DOS, (OZEMPIC, 0.25 OR 0.5 MG/DOSE,) 2 MG/1.5ML SOPN Inject 0.5 mg into the skin once a week.  . senna-docusate (SENOKOT-S) 8.6-50 MG tablet Take 2 tablets by  mouth 2 (two) times daily. (Patient taking differently: Take 2 tablets by mouth 2 (two) times daily. Taking 1-2x daily PRN)  . polyethylene glycol (MIRALAX / GLYCOLAX) packet Take 17 g by mouth daily. (Patient taking differently: Take 17 g by mouth as needed for moderate constipation. )  . [DISCONTINUED] Calcium Carbonate (CALCIUM 500 PO) Take 1 capsule by mouth every other day.    No facility-administered encounter medications on file as of 10/11/2019.     Current Diagnosis/Assessment:  SDOH Interventions     Most Recent Value    SDOH Interventions  SDOH Interventions for the Following Domains  Financial Strain  Financial Strain Interventions  -- [receives multiple medications through patient assistance]     Goals Addressed            This Visit's Progress   . Cholesterol: goal LDL < 70       CARE PLAN ENTRY (see longitudinal plan of care for additional care plan information)  Current Barriers:  . Uncontrolled hyperlipidemia, complicated by DM, HTN . Current antihyperlipidemic regimen: rosuvastatin 5 mg daily, ezetimibe 10 mg daily, . Previous antihyperlipidemic medications tried n/a . Most recent lipid panel:     Component Value Date/Time   CHOL 129 08/12/2019 1412   TRIG 133.0 08/12/2019 1412   HDL 55.60 08/12/2019 1412   CHOLHDL 2 08/12/2019 1412   VLDL 26.6 08/12/2019 1412   LDLCALC 46 08/12/2019 1412   LDLDIRECT 113.0 07/21/2017 1643   . ASCVD risk enhancing conditions: age >38, DM, HTN  Pharmacist Clinical Goal(s):  Marland Kitchen Over the next 150 days, patient will work with PharmD and providers towards optimized antihyperlipidemic therapy  Interventions: . Comprehensive medication review performed; medication list updated in electronic medical record.  . Discussed importance of maintaining cholesterol at goal to prevent heart attack and stroke  Patient Self Care Activities:  . Patient will focus on medication adherence by pill pack  Initial goal documentation     . Diabetes: A1c goal < 7%       CARE PLAN ENTRY (see longitudinal plan of care for additional care plan information)  Current Barriers:  . Diabetes: controlled; complicated by chronic medical conditions including HTN, CAD, HLD Lab Results  Component Value Date   HGBA1C 6.9 (H) 08/12/2019 .   Lab Results  Component Value Date   CREATININE 0.71 08/12/2019   CREATININE 0.72 02/06/2019   CREATININE 0.88 11/05/2018   . Current antihyperglycemic regimen: Jardiance 10 mg daily, Ozempic 0.5 mg weekly . denies hypoglycemic symptoms,  including dizziness, lightheadedness, shaking, sweating . denies hyperglycemic symptoms, including polyuria, polydipsia, polyphagia, nocturia, blurred vision, neuropathy . Current exercise: limited  . Current blood glucose readings: 92-102  Pharmacist Clinical Goal(s):  Marland Kitchen Over the next 150 days, patient will work with PharmD and primary care provider to maintain A1c at goal < 7%  Interventions: . Comprehensive medication review performed, medication list updated in electronic medical record . Inter-disciplinary care team collaboration (see longitudinal plan of care) . Discussed importance of maintaining sugars at goal to prevent complications of diabetes including kidney damage, retinal damage, and cardiovascular disease  Patient Self Care Activities:  . Patient will check blood glucose once daily - fasting , document, and provide at future appointments . Patient will focus on medication adherence by pill box/pack . Patient will take medications as prescribed . Patient will contact provider with any episodes of hypoglycemia . Patient will report any questions or concerns to provider   Initial goal documentation     .  Hypertension: BP goal < 130/80       CARE PLAN ENTRY (see longitudinal plan of care for additional care plan information)  Current Barriers:  . Uncontrolled hypertension, complicated by DM, CAD, HLD . Current antihypertensive regimen: HCTZ 25 mg daily . Previous antihypertensives tried: n/a . Last practice recorded BP readings:  BP Readings from Last 3 Encounters:  08/12/19 124/74  02/06/19 (!) 128/58  11/05/18 130/72   . Current home BP readings: SBP 119-136 . Most recent eGFR/CrCl:  . GFR calc Af Amer .  Date . Value . Ref Range . Status .  05/27/2018 . >60 . >60 mL/min . Final   Pharmacist Clinical Goal(s):  Marland Kitchen Over the next 150 days, patient will work with PharmD and providers to optimize antihypertensive regimen and maintain BP at goal <  130/80  Interventions: . Inter-disciplinary care team collaboration (see longitudinal plan of care) . Comprehensive medication review performed; medication list updated in the electronic medical record.  . Discussed BP goal of < 130/80 for prevention of heart attack and stroke  Patient Self Care Activities:  . Patient will continue to check BP daily , document, and provide at future appointments . Patient will focus on medication adherence by pill pack  Initial goal documentation        Diabetes   Recent Relevant Labs: Lab Results  Component Value Date/Time   HGBA1C 6.9 (H) 08/12/2019 02:12 PM   HGBA1C 7.4 (H) 02/06/2019 12:20 PM   GFR 96.68 08/12/2019 02:12 PM   GFR 95.26 02/06/2019 12:20 PM   MICROALBUR 6.7 (H) 02/06/2019 12:20 PM   MICROALBUR 2.6 (H) 02/07/2018 04:24 PM    Kidney Function Lab Results  Component Value Date   CREATININE 0.71 08/12/2019   CREATININE 0.72 02/06/2019   CREATININE 0.88 11/05/2018      Component Value Date/Time   GFR 96.68 08/12/2019 1412   GFRNONAA >60 05/27/2018 0620   GFRAA >60 05/27/2018 0620    Checking BG: Daily   Recent FBG Readings: 92-102  Patient has failed these meds in past: Farxiga, glipizide, metformin Patient is currently controlled on the following medications: Jardiance 10 mg daily, Ozempic 0.5 mg weekly  Last diabetic Eye exam:  Lab Results  Component Value Date/Time   HMDIABEYEEXA No Retinopathy 11/01/2018 12:00 AM    Last diabetic Foot exam: No results found for: HMDIABFOOTEX   We discussed: diet and exercise extensively, A1c goals and importance of self monitoring.  Plan  Continue current medications and control with diet and exercise   Hypertension   BP today is:  <130/80  Office blood pressures are  BP Readings from Last 3 Encounters:  08/12/19 124/74  02/06/19 (!) 128/58  11/05/18 130/72   Patient has failed these meds in the past: n/a Patient is currently controlled on the following  medications: HCTZ 25 mg daily, potassium chloride 20 - 4 tabs a day  Patient checks BP at home daily  Patient home BP readings are ranging: SBP 119-136  We discussed diet and exercise extensively  Plan  Continue current medications and control with diet and exercise     Hyperlipidemia/CAD   Lipid Panel     Component Value Date/Time   CHOL 129 08/12/2019 1412   TRIG 133.0 08/12/2019 1412   HDL 55.60 08/12/2019 1412   CHOLHDL 2 08/12/2019 1412   VLDL 26.6 08/12/2019 1412   LDLCALC 46 08/12/2019 1412   LDLDIRECT 113.0 07/21/2017 1643     Patient has failed these meds in past:  n/a Patient is currently controlled on the following medications: rosuvastatin 5 mg daily, ezetimibe 10 mg daily, aspirin 81 mg daily, nitroglycerin 0.4 mg SL prn  We discussed:  diet and exercise extensively   Plan  Continue current medications and control with diet and exercise   GERD/GI   Patient has failed these meds in past: n/a Patient is currently controlled on the following medications: omeprazole 20 mg daily, Linzess 145 mcg daily prn (samples), senna-docusate, probiotic, Miralax, Beano, Gas-X, [name] 8 mg BID,  We discussed:  Takes senna-docusate before Linzess for constipation. Still has heartburn with omeprazole, use pepto bismol 30 mg and famotidine prn - about once a week.  Plan  Continue current medications   Seizure disorder   Patient has failed these meds in past: n/a Patient is currently controlled on the following medications: oxcarbazepine 150 mg BID with meals  We discussed: Pt reports 1 mild seizure in last 3 years.   Plan  Continue current medications   Neuropathy   Patient has failed these meds in past: n/a Patient is currently controlled on the following medications: pregabalin 75 mg TID  We discussed: Pt report medication does help with nerve pain.  Plan  Continue current medications   Arthritis   Patient has failed these meds in past:  n/a Patient is currently controlled on the following medications: Tylenol - 2 tabs  We discussed:  Pain is manageable with daily Tylenol.  Plan  Continue current medications  Anxiety   Patient is currently controlled on the following medications: clonazepam 0.5 mg prn  We discussed:  Does not take every day, only about once a week.   Plan  Continue current medications   Dizziness/Vertigo   Patient is currently controlled on the following medications: meclizine 12.5 mg TID prn  We discussed:  Pt has vertigo occasionally, medication does help.  Plan  Continue current medications and control with diet and exercise   Health Maintenance   Patient is currently controlled on the following medications: multivitamin, Astelin nasal spray, albuterol HFA prn, Allegra  We discussed:  Doesn't use nasal spray. Wearing a mask outside helps with pollen.   Plan  Continue current medications    Medication Management   Pt uses [facility] for all medications Does not use pill box Pt endorses 99% compliance - misses dose about once a month  We discussed: Verbal consent obtained for [facility] enhanced pharmacy services (medication synchronization, adherence packaging, delivery coordination). A medication sync plan was created to allow patient to get all medications delivered once every 30 to 90 days per patient preference. Patient understands they have freedom to choose pharmacy and clinical pharmacist will coordinate care between all prescribers and [facility].  Currently enrolled in patient assistance for: Jardiance ([place]) Lyrica Therapist, music) Ozempic Printmaker) Proventil ([employer])  Interior and spatial designer for medication synchronization, packaging and delivery     Follow up: 5 month phone visit  Charlene Brooke, PharmD Corporate treasurer Primary Care at [facility] [phone]

## 2019-10-11 ENCOUNTER — Other Ambulatory Visit: Payer: Self-pay

## 2019-10-11 ENCOUNTER — Ambulatory Visit: Payer: HMO | Admitting: Pharmacist

## 2019-10-11 DIAGNOSIS — I10 Essential (primary) hypertension: Secondary | ICD-10-CM

## 2019-10-11 DIAGNOSIS — E1142 Type 2 diabetes mellitus with diabetic polyneuropathy: Secondary | ICD-10-CM

## 2019-10-11 DIAGNOSIS — E782 Mixed hyperlipidemia: Secondary | ICD-10-CM

## 2019-10-11 NOTE — Patient Instructions (Addendum)
Visit Information  Thank you for meeting with me to discuss your medications! I look forward to working with you to achieve your health care goals. Below is a summary of what we talked about during the visit:  Goals Addressed            This Visit's Progress   . Cholesterol: goal LDL < 70       CARE PLAN ENTRY (see longitudinal plan of care for additional care plan information)  Current Barriers:  . Uncontrolled hyperlipidemia, complicated by DM, HTN . Current antihyperlipidemic regimen: rosuvastatin 5 mg daily, ezetimibe 10 mg daily, . Previous antihyperlipidemic medications tried n/a . Most recent lipid panel:     Component Value Date/Time   CHOL 129 08/12/2019 1412   TRIG 133.0 08/12/2019 1412   HDL 55.60 08/12/2019 1412   CHOLHDL 2 08/12/2019 1412   VLDL 26.6 08/12/2019 1412   LDLCALC 46 08/12/2019 1412   LDLDIRECT 113.0 07/21/2017 1643   . ASCVD risk enhancing conditions: age >68, DM, HTN  Pharmacist Clinical Goal(s):  Marland Kitchen Over the next 150 days, patient will work with PharmD and providers towards optimized antihyperlipidemic therapy  Interventions: . Comprehensive medication review performed; medication list updated in electronic medical record.  . Discussed importance of maintaining cholesterol at goal to prevent heart attack and stroke  Patient Self Care Activities:  . Patient will focus on medication adherence by pill pack  Initial goal documentation     . Diabetes: A1c goal < 7%       CARE PLAN ENTRY (see longitudinal plan of care for additional care plan information)  Current Barriers:  . Diabetes: controlled; complicated by chronic medical conditions including HTN, CAD, HLD Lab Results  Component Value Date   HGBA1C 6.9 (H) 08/12/2019 .   Lab Results  Component Value Date   CREATININE 0.71 08/12/2019   CREATININE 0.72 02/06/2019   CREATININE 0.88 11/05/2018   . Current antihyperglycemic regimen: Jardiance 10 mg daily, Ozempic 0.5 mg  weekly . denies hypoglycemic symptoms, including dizziness, lightheadedness, shaking, sweating . denies hyperglycemic symptoms, including polyuria, polydipsia, polyphagia, nocturia, blurred vision, neuropathy . Current exercise: limited  . Current blood glucose readings: 92-102  Pharmacist Clinical Goal(s):  Marland Kitchen Over the next 150 days, patient will work with PharmD and primary care provider to maintain A1c at goal < 7%  Interventions: . Comprehensive medication review performed, medication list updated in electronic medical record . Inter-disciplinary care team collaboration (see longitudinal plan of care) . Discussed importance of maintaining sugars at goal to prevent complications of diabetes including kidney damage, retinal damage, and cardiovascular disease  Patient Self Care Activities:  . Patient will check blood glucose once daily - fasting , document, and provide at future appointments . Patient will focus on medication adherence by pill box/pack . Patient will take medications as prescribed . Patient will contact provider with any episodes of hypoglycemia . Patient will report any questions or concerns to provider   Initial goal documentation     . Hypertension: BP goal < 130/80       CARE PLAN ENTRY (see longitudinal plan of care for additional care plan information)  Current Barriers:  . Uncontrolled hypertension, complicated by DM, CAD, HLD . Current antihypertensive regimen: HCTZ 25 mg daily . Previous antihypertensives tried: n/a . Last practice recorded BP readings:  BP Readings from Last 3 Encounters:  08/12/19 124/74  02/06/19 (!) 128/58  11/05/18 130/72   . Current home BP readings: SBP 119-136 . Most  recent eGFR/CrCl:  . GFR calc Af Amer .  Date . Value . Ref Range . Status .  05/27/2018 . >60 . >60 mL/min . Final   Pharmacist Clinical Goal(s):  Marland Kitchen Over the next 150 days, patient will work with PharmD and providers to optimize antihypertensive regimen and  maintain BP at goal < 130/80  Interventions: . Inter-disciplinary care team collaboration (see longitudinal plan of care) . Comprehensive medication review performed; medication list updated in the electronic medical record.  . Discussed BP goal of < 130/80 for prevention of heart attack and stroke  Patient Self Care Activities:  . Patient will continue to check BP daily , document, and provide at future appointments . Patient will focus on medication adherence by pill pack  Initial goal documentation        [name] was given information about Chronic Care Management services today including:  1. CCM service includes personalized support from designated clinical staff supervised by her physician, including individualized plan of care and coordination with other care providers 2. 24/7 contact phone numbers for assistance for urgent and routine care needs. 3. Standard insurance, coinsurance, copays and deductibles apply for chronic care management only during months in which we provide at least 20 minutes of these services. Most insurances cover these services at 100%, however patients may be responsible for any copay, coinsurance and/or deductible if applicable. This service may help you avoid the need for more expensive face-to-face services. 4. Only one practitioner may furnish and bill the service in a calendar month. 5. The patient may stop CCM services at any time (effective at the end of the month) by phone call to the office staff.  Patient agreed to services and verbal consent obtained.   The patient verbalized understanding of instructions provided today and agreed to receive a mailed copy of patient instruction and/or educational materials. Telephone follow up appointment with pharmacy team member scheduled for: 5 months  Charlene Brooke, PharmD Clinical Pharmacist [facility] [phone]   Heart-Healthy Eating Plan Many factors influence your  heart (coronary) health, including eating and exercise habits. Coronary risk increases with abnormal blood fat (lipid) levels. Heart-healthy meal planning includes limiting unhealthy fats, increasing healthy fats, and making other diet and lifestyle changes.  What are tips for following this plan? Cooking [place] foods using methods other than frying. Baking, boiling, grilling, and broiling are all good options. Other ways to reduce fat include:  Removing the skin from poultry.  Removing all visible fats from meats.  Steaming vegetables in water or broth. Meal planning   At meals, imagine dividing your plate into fourths: ? Fill one-half of your plate with vegetables and green salads. ? Fill one-fourth of your plate with whole grains. ? Fill one-fourth of your plate with lean protein foods.  Eat 4-5 servings of vegetables per day. One serving equals 1 cup raw or cooked vegetable, or 2 cups raw leafy greens.  Eat 4-5 servings of fruit per day. One serving equals 1 medium whole fruit,  cup dried fruit,  cup fresh, frozen, or canned fruit, or  cup 100% fruit juice.  Eat more foods that contain soluble fiber. Examples include apples, broccoli, carrots, beans, peas, and barley. Aim to get 25-30 g of fiber per day.  Increase your consumption of legumes, nuts, and seeds to 4-5 servings per week. One serving of dried beans or legumes equals  cup cooked, 1 serving of nuts is  cup, and 1 serving of  seeds equals 1 tablespoon. Fats  Choose healthy fats more often. Choose monounsaturated and polyunsaturated fats, such as olive and canola oils, flaxseeds, walnuts, almonds, and seeds.  Eat more omega-3 fats. Choose salmon, mackerel, sardines, tuna, flaxseed oil, and ground flaxseeds. Aim to eat fish at least 2 times each week.  Check food labels carefully to identify foods with trans fats or high amounts of saturated fat.  Limit saturated fats. These are found in animal products, such as meats,  butter, and cream. Plant sources of saturated fats include palm oil, palm kernel oil, and coconut oil.  Avoid foods with partially hydrogenated oils in them. These contain trans fats. Examples are stick margarine, some tub margarines, cookies, crackers, and other baked goods.  Avoid fried foods. General information  Eat more home-cooked food and less restaurant, buffet, and fast food.  Limit or avoid alcohol.  Limit foods that are high in starch and sugar.  Lose weight if you are overweight. Losing just 5-10% of your body weight can help your overall health and prevent diseases such as diabetes and heart disease.  Monitor your salt (sodium) intake, especially if you have high blood pressure. Talk with your health care provider about your sodium intake.  Try to incorporate more vegetarian meals weekly. What foods can I eat? Fruits All fresh, canned (in natural juice), or frozen fruits. Vegetables Fresh or frozen vegetables (raw, steamed, roasted, or grilled). Green salads. Grains Most grains. Choose whole wheat and whole grains most of the time. Rice and pasta, including brown rice and pastas made with whole wheat. Meats and other proteins Lean, well-trimmed beef, veal, pork, and lamb. Chicken and [place] without skin. All fish and shellfish. Wild duck, rabbit, pheasant, and venison. Egg whites or low-cholesterol egg substitutes. Dried beans, peas, lentils, and tofu. Seeds and most nuts. Dairy Low-fat or nonfat cheeses, including ricotta and mozzarella. Skim or 1% milk (liquid, powdered, or evaporated). Buttermilk made with low-fat milk. Nonfat or low-fat yogurt. Fats and oils Non-hydrogenated (trans-free) margarines. Vegetable oils, including soybean, sesame, sunflower, olive, peanut, safflower, corn, canola, and cottonseed. Salad dressings or mayonnaise made with a vegetable oil. Beverages Water (mineral or sparkling). Coffee and tea. Diet carbonated beverages. Sweets and  desserts Sherbet, gelatin, and fruit ice. Small amounts of dark chocolate. Limit all sweets and desserts. Seasonings and condiments All seasonings and condiments. The items listed above may not be a complete list of foods and beverages you can eat. Contact a dietitian for more options. What foods are not recommended? Fruits Canned fruit in heavy syrup. Fruit in cream or butter sauce. Fried fruit. Limit coconut. Vegetables Vegetables cooked in cheese, cream, or butter sauce. Fried vegetables. Grains Breads made with saturated or trans fats, oils, or whole milk. Croissants. Sweet rolls. Donuts. High-fat crackers, such as cheese crackers. Meats and other proteins Fatty meats, such as hot dogs, ribs, sausage, bacon, rib-eye roast or steak. High-fat deli meats, such as salami and bologna. Caviar. Domestic duck and goose. Organ meats, such as liver. Dairy Cream, sour cream, cream cheese, and creamed cottage cheese. Whole milk cheeses. Whole or 2% milk (liquid, evaporated, or condensed). Whole buttermilk. Cream sauce or high-fat cheese sauce. Whole-milk yogurt. Fats and oils Meat fat, or shortening. Cocoa butter, hydrogenated oils, palm oil, coconut oil, palm kernel oil. Solid fats and shortenings, including bacon fat, salt pork, lard, and butter. Nondairy cream substitutes. Salad dressings with cheese or sour cream. Beverages Regular sodas and any drinks with added sugar. Sweets and desserts Frosting. Pudding.  Cookies. Cakes. Pies. Milk chocolate or white chocolate. Buttered syrups. Full-fat ice cream or ice cream drinks. The items listed above may not be a complete list of foods and beverages to avoid. Contact a dietitian for more information. Summary  Heart-healthy meal planning includes limiting unhealthy fats, increasing healthy fats, and making other diet and lifestyle changes.  Lose weight if you are overweight. Losing just 5-10% of your body weight can help your overall health and  prevent diseases such as diabetes and heart disease.  Focus on eating a balance of foods, including fruits and vegetables, low-fat or nonfat dairy, lean protein, nuts and legumes, whole grains, and heart-healthy oils and fats. This information is not intended to replace advice given to you by your health care provider. Make sure you discuss any questions you have with your health care provider. Document Revised: 07/21/2017 Document Reviewed: 07/21/2017 Elsevier Patient Education  2020 [employer].

## 2019-10-14 ENCOUNTER — Other Ambulatory Visit: Payer: Self-pay

## 2019-10-14 ENCOUNTER — Telehealth: Payer: Self-pay | Admitting: Neurology

## 2019-10-14 MED ORDER — OXCARBAZEPINE 150 MG PO TABS: 150.0000 mg | ORAL_TABLET | Freq: Two times a day (BID) | ORAL | 1 refills | Status: DC

## 2019-10-14 NOTE — Telephone Encounter (Signed)
Pharmacy called and asked for Dr. Posey Pronto to send a new order for Oxcarbazepine 150 mg.

## 2019-10-14 NOTE — Telephone Encounter (Signed)
[facility]

## 2019-10-15 ENCOUNTER — Other Ambulatory Visit: Payer: Self-pay

## 2019-10-15 MED ORDER — OMEPRAZOLE 20 MG PO CPDR: 20.0000 mg | DELAYED_RELEASE_CAPSULE | Freq: Every day | ORAL | 1 refills | Status: DC

## 2019-10-15 MED ORDER — LANCETS MISC: 3 refills | Status: DC

## 2019-10-15 MED ORDER — ROSUVASTATIN CALCIUM 5 MG PO TABS: 5.0000 mg | ORAL_TABLET | Freq: Every day | ORAL | 1 refills | Status: DC

## 2019-10-15 MED ORDER — ONETOUCH ULTRA VI STRP: ORAL_STRIP | 3 refills | Status: DC

## 2019-10-15 MED ORDER — HYDROCHLOROTHIAZIDE 25 MG PO TABS: 25.0000 mg | ORAL_TABLET | Freq: Every day | ORAL | 1 refills | Status: DC

## 2019-10-15 MED ORDER — POTASSIUM CHLORIDE CRYS ER 20 MEQ PO TBCR: EXTENDED_RELEASE_TABLET | ORAL | 1 refills | Status: DC

## 2019-10-18 ENCOUNTER — Other Ambulatory Visit: Payer: Self-pay | Admitting: Internal Medicine

## 2019-10-18 MED ORDER — PREGABALIN 75 MG PO CAPS: 75.0000 mg | ORAL_CAPSULE | Freq: Three times a day (TID) | ORAL | 0 refills | Status: DC

## 2019-10-22 ENCOUNTER — Other Ambulatory Visit: Payer: Self-pay | Admitting: Internal Medicine

## 2019-10-31 ENCOUNTER — Other Ambulatory Visit: Payer: Self-pay | Admitting: Internal Medicine

## 2019-11-28 ENCOUNTER — Other Ambulatory Visit: Payer: Self-pay | Admitting: Neurology

## 2019-12-13 ENCOUNTER — Other Ambulatory Visit: Payer: Self-pay | Admitting: Internal Medicine

## 2020-01-05 ENCOUNTER — Other Ambulatory Visit: Payer: Self-pay

## 2020-01-05 ENCOUNTER — Encounter (HOSPITAL_COMMUNITY): Payer: Self-pay | Admitting: Emergency Medicine

## 2020-01-05 ENCOUNTER — Emergency Department (HOSPITAL_COMMUNITY): Payer: HMO

## 2020-01-05 ENCOUNTER — Observation Stay (HOSPITAL_COMMUNITY)
Admission: EM | Admit: 2020-01-05 | Discharge: 2020-01-06 | Disposition: A | Discharge status: Home / Self Care | Payer: HMO | Attending: Cardiovascular Disease | Admitting: Cardiovascular Disease

## 2020-01-05 DIAGNOSIS — J449 Chronic obstructive pulmonary disease, unspecified: Secondary | ICD-10-CM | POA: Diagnosis not present

## 2020-01-05 DIAGNOSIS — I200 Unstable angina: Secondary | ICD-10-CM | POA: Diagnosis present

## 2020-01-05 DIAGNOSIS — E785 Hyperlipidemia, unspecified: Secondary | ICD-10-CM | POA: Diagnosis not present

## 2020-01-05 DIAGNOSIS — Z7984 Long term (current) use of oral hypoglycemic drugs: Secondary | ICD-10-CM | POA: Diagnosis not present

## 2020-01-05 DIAGNOSIS — R079 Chest pain, unspecified: Secondary | ICD-10-CM | POA: Diagnosis not present

## 2020-01-05 DIAGNOSIS — R0602 Shortness of breath: Secondary | ICD-10-CM | POA: Diagnosis not present

## 2020-01-05 DIAGNOSIS — K219 Gastro-esophageal reflux disease without esophagitis: Secondary | ICD-10-CM | POA: Insufficient documentation

## 2020-01-05 DIAGNOSIS — Z79899 Other long term (current) drug therapy: Secondary | ICD-10-CM | POA: Insufficient documentation

## 2020-01-05 DIAGNOSIS — E119 Type 2 diabetes mellitus without complications: Secondary | ICD-10-CM | POA: Insufficient documentation

## 2020-01-05 DIAGNOSIS — I2510 Atherosclerotic heart disease of native coronary artery without angina pectoris: Secondary | ICD-10-CM | POA: Diagnosis present

## 2020-01-05 DIAGNOSIS — I119 Hypertensive heart disease without heart failure: Secondary | ICD-10-CM | POA: Insufficient documentation

## 2020-01-05 DIAGNOSIS — Z20822 Contact with and (suspected) exposure to covid-19: Secondary | ICD-10-CM | POA: Diagnosis not present

## 2020-01-05 DIAGNOSIS — Z87891 Personal history of nicotine dependence: Secondary | ICD-10-CM | POA: Insufficient documentation

## 2020-01-05 DIAGNOSIS — E876 Hypokalemia: Secondary | ICD-10-CM

## 2020-01-05 DIAGNOSIS — I10 Essential (primary) hypertension: Secondary | ICD-10-CM | POA: Diagnosis present

## 2020-01-05 DIAGNOSIS — E1169 Type 2 diabetes mellitus with other specified complication: Secondary | ICD-10-CM

## 2020-01-05 DIAGNOSIS — R0789 Other chest pain: Principal | ICD-10-CM | POA: Insufficient documentation

## 2020-01-05 DIAGNOSIS — E1140 Type 2 diabetes mellitus with diabetic neuropathy, unspecified: Secondary | ICD-10-CM

## 2020-01-05 HISTORY — DX: Unstable angina: I20.0

## 2020-01-05 LAB — CBC
HCT: 44.1 % (ref 36.0–46.0)
Hemoglobin: 14.3 g/dL (ref 12.0–15.0)
MCH: 30.2 pg (ref 26.0–34.0)
MCHC: 32.4 g/dL (ref 30.0–36.0)
MCV: 93.2 fL (ref 80.0–100.0)
Platelets: 248 10*3/uL (ref 150–400)
RBC: 4.73 MIL/uL (ref 3.87–5.11)
RDW: 13.2 % (ref 11.5–15.5)
WBC: 6.2 10*3/uL (ref 4.0–10.5)
nRBC: 0 % (ref 0.0–0.2)

## 2020-01-05 LAB — SARS CORONAVIRUS 2 BY RT PCR (HOSPITAL ORDER, PERFORMED IN ~~LOC~~ HOSPITAL LAB): SARS Coronavirus 2: NEGATIVE

## 2020-01-05 LAB — TROPONIN I (HIGH SENSITIVITY)
Troponin I (High Sensitivity): 3 ng/L (ref <18)
Troponin I (High Sensitivity): 4 ng/L (ref <18)

## 2020-01-05 LAB — BASIC METABOLIC PANEL
Anion gap: 10 (ref 5–15)
BUN: 13 mg/dL (ref 8–23)
CO2: 28 mmol/L (ref 22–32)
Calcium: 9.9 mg/dL (ref 8.9–10.3)
Chloride: 99 mmol/L (ref 98–111)
Creatinine, Ser: 0.87 mg/dL (ref 0.44–1.00)
GFR calc Af Amer: >60 mL/min (ref >60)
GFR calc non Af Amer: >60 mL/min (ref >60)
Glucose, Bld: 92 mg/dL (ref 70–99)
Potassium: 3.5 mmol/L (ref 3.5–5.1)
Sodium: 137 mmol/L (ref 135–145)

## 2020-01-05 LAB — GLUCOSE, CAPILLARY: Glucose-Capillary: 176 mg/dL — ABNORMAL HIGH (ref 70–99)

## 2020-01-05 MED ORDER — NITROGLYCERIN 0.4 MG SL SUBL: 0.4000 mg | SUBLINGUAL_TABLET | SUBLINGUAL | Status: DC | PRN

## 2020-01-05 MED ORDER — ASPIRIN EC 81 MG PO TBEC
81.0000 mg | DELAYED_RELEASE_TABLET | Freq: Every day | ORAL | Status: DC
  Filled 2020-01-05 (×2): qty 1

## 2020-01-05 MED ORDER — ACETAMINOPHEN 325 MG PO TABS
650.0000 mg | ORAL_TABLET | ORAL | Status: DC | PRN
  Administered 2020-01-05: 650 mg via ORAL
  Filled 2020-01-05: qty 2

## 2020-01-05 MED ORDER — ASPIRIN 81 MG PO CHEW
324.0000 mg | CHEWABLE_TABLET | ORAL | Status: AC
  Administered 2020-01-05: 324 mg via ORAL
  Filled 2020-01-05: qty 4

## 2020-01-05 MED ORDER — ASPIRIN 300 MG RE SUPP: 300.0000 mg | RECTAL | Status: AC

## 2020-01-05 MED ORDER — HEPARIN (PORCINE) 25000 UT/250ML-% IV SOLN
800.0000 [IU]/h | INTRAVENOUS | Status: DC
  Administered 2020-01-05: 900 [IU]/h via INTRAVENOUS
  Filled 2020-01-05: qty 250

## 2020-01-05 MED ORDER — ASPIRIN 81 MG PO CHEW: 324.0000 mg | CHEWABLE_TABLET | Freq: Once | ORAL | Status: DC

## 2020-01-05 MED ORDER — HEPARIN BOLUS VIA INFUSION
4000.0000 [IU] | Freq: Once | INTRAVENOUS | Status: AC
  Administered 2020-01-05: 4000 [IU] via INTRAVENOUS
  Filled 2020-01-05: qty 4000

## 2020-01-05 MED ORDER — ONDANSETRON HCL 4 MG/2ML IJ SOLN: 4.0000 mg | Freq: Four times a day (QID) | INTRAMUSCULAR | Status: DC | PRN

## 2020-01-05 MED ORDER — SODIUM CHLORIDE 0.9% FLUSH: 3.0000 mL | Freq: Once | INTRAVENOUS | Status: DC

## 2020-01-05 NOTE — ED Notes (Signed)
Updated family to the visiting policy on the floor after verifying w/ 3E.  RN also ordered dinner for the Pt.

## 2020-01-05 NOTE — ED Notes (Signed)
Report called to RN on 3E  

## 2020-01-05 NOTE — ED Triage Notes (Signed)
Pt reports pain all over today, SOB, and chest heaviness.  Denies nausea and vomiting.

## 2020-01-05 NOTE — ED Provider Notes (Signed)
[facility] DEPARTMENT Provider Note   CSN: [id number] Arrival date & time: 01/05/20  1342     History Chief Complaint  Patient presents with  . Chest Pain  . Shortness of Breath    [name] is a 79 y.o. female.  HPI     79 yo female known history of coronary artery disease, COPD, hypertension, type 2 diabetes, history of smoking in the past presents today complaining of chest pain.  She states it started this morning around 10 although she had some stuttering pain yesterday.  She describes it as substernal and both sharp and heavy in nature.  She states it is 9 out of 10 currently.  She has not taken any interventions for this.  She has associated dyspnea and has had some mild coughing.  She denies fever, chills, productive cough.  She has received her Covid vaccine in the past.  She is a patient of Dr. Ernst Spell.  She had some prior chest pain in the past and was worked up with a CT angiogram.  At that time she was noted to have three-vessel coronary artery disease with greater than 75% stenosis in the left proximal LAD and proximal portion of the first diagonal.  She subsequently had left heart catheterization performed by Dr. Tamala Julian.  This revealed medial branch first diagonal with 90% stenosis, LAD with diffuse 30% mid vessel disease, ostial circumflex 50 to 60% eccentric narrowing, RCA with mild eccentric 30% narrowing EF greater than 60%.  At that time he was advised to continue aspirin and statin therapy  Past Medical History:  Diagnosis Date  . Allergic rhinitis, cause unspecified 03/13/2013  . Anxiety   . Cerebrovascular disease, unspecified 03/13/2013   Atrophy and small vessel dz noted, MR brain 2009  . Depression   . Diabetes mellitus, type 2 ([place])   . Diverticulosis of colon 03/2010 hosp  . Dyslipidemia   . GERD (gastroesophageal reflux disease)   . Hypertension   . Neuropathy   . OSA on CPAP   . Osteoarthritis of shoulder  region    and Knee  . Seizure disorder ([place])    onset 11 years ago; repeated 2013    Patient Active Problem List   Diagnosis Date Noted  . CAD (coronary artery disease) 05/28/2018  . Abnormal CT scan, heart   . Neck pain 07/21/2017  . Decreased hearing of right ear 02/01/2017  . Primary osteoarthritis of left knee 01/12/2017  . Constipation 01/10/2017  . Hypercalcemia 10/12/2016  . Chest pain 10/12/2016  . DOE (dyspnea on exertion) 10/12/2016  . Chronic back pain 08/23/2016  . Hair loss 08/23/2016  . Diabetes ([place]) 07/26/2016  . Anxiety 09/14/2015  . Diverticulitis large intestine w/o perforation or abscess w/o bleeding 05/12/2014  . Essential hypertension 05/12/2014  . Diabetic neuropathy ([place]) 05/12/2014  . Seizure disorder ([place]) 05/12/2014  . Hepatic steatosis 05/12/2014  . Cerebrovascular disease, unspecified 03/13/2013  . Allergic rhinitis 03/13/2013  . GOITER, MULTINODULAR 05/18/2009  . VERTIGO 04/13/2009  . Irritable bowel syndrome 03/20/2009  . Hyperlipidemia 03/18/2009  . GERD 03/18/2009    Past Surgical History:  Procedure Laterality Date  . ABDOMINAL HYSTERECTOMY  1970's   Partial  . APPENDECTOMY    . CHOLECYSTECTOMY    . LEFT HEART CATH AND CORONARY ANGIOGRAPHY N/A 05/28/2018   Procedure: LEFT HEART CATH AND CORONARY ANGIOGRAPHY;  Surgeon: Belva Crome, MD;  Location: [facility];  Service: Cardiovascular;  Laterality: N/A;  .  LUMBAR EPIDURAL INJECTION Left 08/25/2017  . SHOULDER SURGERY  2008   LT, post fall   . TONSILLECTOMY AND ADENOIDECTOMY    . TOTAL KNEE ARTHROPLASTY Left 01/12/2017   Procedure: LEFT TOTAL KNEE ARTHROPLASTY;  Surgeon: Susa Day, MD;  Location: [facility];  Service: Orthopedics;  Laterality: Left;  120 mins     OB History   No obstetric history on file.     Family History  Problem Relation Age of Onset  . Arthritis Mother   . Heart disease Father   . Arthritis Other        Grandmother  . Diabetes Other         Grandmother    Social History   Tobacco Use  . Smoking status: Former Smoker    Quit date: 10/19/1985    Years since quitting: 34.2  . Smokeless tobacco: Never Used  Vaping Use  . Vaping Use: Never used  Substance Use Topics  . Alcohol use: Yes    Alcohol/week: 0.0 standard drinks    Comment: occasionally   . Drug use: No    Home Medications Prior to Admission medications   Medication Sig Start Date End Date Taking? Authorizing Provider  albuterol (PROAIR HFA) 108 (90 BASE) MCG/ACT inhaler Inhale 2 puffs into the lungs every 6 (six) hours as needed for wheezing or shortness of breath.     [provider]  Alpha-D-Galactosidase (BEANO PO) Take 1-2 tablets by mouth daily as needed (for gas).    [provider]  aspirin EC 81 MG tablet Take 1 tablet (81 mg total) by mouth daily. 04/17/18   Belva Crome, MD  azelastine (ASTELIN) 0.1 % nasal spray Place 2 sprays into both nostrils 2 (two) times daily as needed for rhinitis. Use in each nostril as directed    [provider]  blood glucose meter kit and supplies KIT Dispense based on patient and insurance preference. Use up to four times daily as directed. E11.9. 04/15/19   Binnie Rail, MD  blood glucose meter kit and supplies KIT Dispense based on patient and insurance preference. Use up to four times daily as directed. (FOR ICD-10: E11.9). 08/27/19   Binnie Rail, MD  clonazePAM (KLONOPIN) 0.5 MG tablet TAKE 1/2 TO 1 TABLET TWICE A DAY AS NEEDED FOR ANXIETY 07/09/19   Burns, Claudina Lick, MD  ezetimibe (ZETIA) 10 MG tablet TAKE 1 TABLET BY MOUTH EVERY DAY 08/30/18   Burns, Claudina Lick, MD  glucose blood (ONETOUCH ULTRA) test strip USE UP TO 4 TIMES DAILY AS DIRECTED 10/15/19   Binnie Rail, MD  hydrochlorothiazide (HYDRODIURIL) 25 MG tablet Take 1 tablet (25 mg total) by mouth daily. 10/15/19   Binnie Rail, MD  JARDIANCE 10 MG TABS tablet TAKE 1 TABLET BY MOUTH EVERY DAY 04/15/19   Binnie Rail, MD  Lancets MISC  Use to check blood sugars up to 4 times daily as directed. E11.9 10/15/19   Binnie Rail, MD  linaclotide [facility]) 145 MCG CAPS capsule Take 1 capsule (145 mcg total) by mouth daily as needed (constipation). 08/21/19   Binnie Rail, MD  lubiprostone ([name]) 24 MCG capsule Take 24 mcg by mouth 2 (two) times daily with a meal.     [provider]  meclizine (ANTIVERT) 12.5 MG tablet TAKE 1 TABLET (12.5 MG TOTAL) BY MOUTH 3 (THREE) TIMES DAILY AS NEEDED FOR DIZZINESS. 10/31/19   Binnie Rail, MD  Multiple Vitamin (MULTIVITAMIN) capsule Take 1  capsule by mouth daily.     [provider]  nitroGLYCERIN (NITROSTAT) 0.4 MG SL tablet Place 1 tablet (0.4 mg total) under the tongue every 5 (five) minutes as needed for chest pain. 09/18/19   Binnie Rail, MD  omeprazole (PRILOSEC) 20 MG capsule TAKE 1 CAPSULE BY MOUTH EVERY DAY 10/31/19   Binnie Rail, MD  OXcarbazepine (TRILEPTAL) 150 MG tablet TAKE 1 TABLET BY MOUTH TWICE A DAY 11/28/19   Patel, Donika K, DO  polyethylene glycol (MIRALAX / GLYCOLAX) packet Take 17 g by mouth daily. Patient taking differently: Take 17 g by mouth as needed for moderate constipation.  01/12/17   Susa Day, MD  potassium chloride SA (KLOR-CON M20) 20 MEQ tablet TAKE 1 TABLET (20 MEQ TOTAL) BY MOUTH 3 (THREE) TIMES DAILY. 10/15/19   Binnie Rail, MD  pregabalin (LYRICA) 75 MG capsule TAKE ONE CAPSULE BY MOUTH THREE TIMES DAILY 12/13/19   Marrian Salvage, FNP  Probiotic Product (PROBIOTIC PO) Take 1 capsule by mouth daily after breakfast.     [provider]  rosuvastatin (CRESTOR) 5 MG tablet Take 1 tablet (5 mg total) by mouth daily. 10/15/19   Burns, Claudina Lick, MD  Semaglutide,0.25 or 0.5MG/DOS, (OZEMPIC, 0.25 OR 0.5 MG/DOSE,) 2 MG/1.5ML SOPN Inject 0.5 mg into the skin once a week. 08/19/19   Burns, Claudina Lick, MD  senna-docusate (SENOKOT-S) 8.6-50 MG tablet Take 2 tablets by mouth 2 (two) times daily. Patient taking differently: Take 2  tablets by mouth 2 (two) times daily. Taking 1-2x daily PRN 05/28/18   Lyda Jester M, PA-C  Calcium Carbonate (CALCIUM 500 PO) Take 1 capsule by mouth every other day.   09/26/14  [provider]    Allergies    Cymbalta [duloxetine hcl], Effexor [venlafaxine], Other, Statins, and Penicillins  Review of Systems   Review of Systems  All other systems reviewed and are negative.   Physical Exam Updated Vital Signs BP 109/69   Pulse (!) 55   Temp 98.4 F (36.9 C) (Oral)   Resp 17   SpO2 100%   Physical Exam Vitals and nursing note reviewed.  Constitutional:      General: She is not in acute distress.    Appearance: She is well-developed. She is obese. She is not ill-appearing.  HENT:     Head: Normocephalic.  Eyes:     Pupils: Pupils are equal, round, and reactive to light.  Cardiovascular:     Rate and Rhythm: Normal rate and regular rhythm.     Heart sounds: Normal heart sounds.  Pulmonary:     Effort: Pulmonary effort is normal.     Breath sounds: Normal breath sounds.  Abdominal:     General: Bowel sounds are normal.     Palpations: Abdomen is soft.  Musculoskeletal:        General: Normal range of motion.     Cervical back: Normal range of motion.  Skin:    General: Skin is warm and dry.     Capillary Refill: Capillary refill takes less than 2 seconds.  Neurological:     General: No focal deficit present.     Mental Status: She is alert.  Psychiatric:        Mood and Affect: Mood normal.     ED Results / Procedures / Treatments   Labs (all labs ordered are listed, but only abnormal results are displayed) Labs Reviewed  BASIC METABOLIC PANEL  CBC  TROPONIN I (HIGH SENSITIVITY)  TROPONIN I (HIGH SENSITIVITY)    EKG EKG Interpretation  Date/Time:  'Sunday January 05 2020 14:05:38 EDT Ventricular Rate:  55 PR Interval:  164 QRS Duration: 82 QT Interval:  436 QTC Calculation: 417 R Axis:   54 Text Interpretation: Sinus bradycardia  Otherwise normal ECG baseline wander in II, and III Confirmed by Xiana Carns ([id number]) on 01/05/2020 4:32:02 PM   Radiology DG Chest 2 View  Result Date: 01/05/2020 CLINICAL DATA:  Diffuse chest pain for 1 day, chest heaviness, shortness of breath EXAM: CHEST - 2 VIEW COMPARISON:  05/26/2018 FINDINGS: The heart size and mediastinal contours are within normal limits. Both lungs are clear. The visualized skeletal structures are unremarkable. IMPRESSION: No active cardiopulmonary disease. Electronically Signed   By: Michael  Brown M.D.   On: 01/05/2020 14:57    Procedures Procedures (including critical care time)  Medications Ordered in ED Medications  sodium chloride flush (NS) 0.9 % injection 3 mL (has no administration in time range)    ED Course  I have reviewed the triage vital signs and the nursing notes.  Pertinent labs & imaging results that were available during my care of the patient were reviewed by me and considered in my medical decision making (see chart for details).  Clinical Course as of Jan 04 1737  Sun Jan 05, 2020  1737 Labs, chest x-Sharlon Pfohl, and EKG all personally reviewed   radiologist interpretation of chest x-Bookert Guzzi reviewed   [DR]    Clinical Course User Index [DR] Sharunda Salmon, MD   MDM Rules/Calculators/A&P                           79'  year old female with known coronary artery disease presents today with substernal chest pain and dyspnea.  Lungs are clear to auscultation.  Chest x-Delylah Stanczyk is clear.  She has a normal heart rate and no pleuritic type pain.  I have a low suspicion for pulmonary embolism.  She has no swelling in her legs.  Pain is consistent with her prior coronary artery disease.  She has been being treated medically for this.  She is given nitroglycerin and aspirin here in the department.  I discussed her care with Dr. Rodman Key who will see her for admission Final Clinical Impression(s) / ED Diagnoses Final diagnoses:  Chest pain, unspecified type     Rx / DC Orders ED Discharge Orders    None       Pattricia Boss, MD 01/05/20 1738

## 2020-01-05 NOTE — ED Notes (Signed)
Dr Ray at bedside. 

## 2020-01-05 NOTE — H&P (Signed)
Cardiologist: Dr. Daneen Schick CC: Chest pain HPI:  79 yr old female known history of coronary artery disease, COPD, hypertension, type 2 diabetes, history of smoking in the past presents today complaining of chest pain.  She states it started this morning around 10 although she had some stuttering pain for past 1 week.  She describes it as substernal and both sharp and heavy in nature.  She states it is 7 out of 10 currently.  She took NTG that gave her some relief but it came back.  She has associated dyspnea and has had some mild coughing.  She denies fever, chills, productive cough.  She has received her Covid vaccine in the past.  She is a patient of Dr. Daneen Schick.  She had some prior chest pain in the past and was worked up with a cath performed by Dr. Tamala Julian.  This revealed medial branch first diagonal with 90% stenosis, LAD with diffuse 30% mid vessel disease, ostial circumflex 50 to 60% eccentric narrowing, RCA with mild eccentric 30% narrowing EF greater than 60%.  At that time he was advised to continue aspirin and statin therapy. Has not had any chest pain till last week. Feels cold but not nausea or cold sweats. She has had a fall previously and she has constant msk pain. She lives with her son and states that she does not want to worry her children too much which is why she tried to underplay her symoptoms. She has localized tenderness on the Lt chest but this is different from her chest heaviness.    Review of Systems:     Cardiac Review of Systems: {Y] = yes '[ ]'  = no  Chest Pain [  y  ]  Resting SOB [   ] Exertional SOB  [ y ]  Orthopnea [  ]   Pedal Edema [   ]    Palpitations [  ] Syncope  [  ]   Presyncope [   ]  General Review of Systems: [Y] = yes [  ]=no Constitional: recent weight change [  ]; anorexia [  ]; fatigue [ y ]; nausea [  ]; night sweats [  ]; fever [  ]; or chills [  ];                                                                     Dental: poor dentition[  ];     Eye : blurred vision [  ]; diplopia [   ]; vision changes [  ];  Amaurosis fugax[  ]; Resp: cough [  ];  wheezing[  ];  hemoptysis[  ]; shortness of breath[  ]; paroxysmal nocturnal dyspnea[  ]; dyspnea on exertion[  ]; or orthopnea[  ];  GI:  gallstones[  ], vomiting[  ];  dysphagia[  ]; melena[  ];  hematochezia [  ]; heartburn[  ];   GU: kidney stones [  ]; hematuria[  ];   dysuria [  ];  nocturia[  ];               Skin: rash [  ], swelling[  ];, hair loss[  ];  peripheral edema[  ];  or itching[  ]; Musculosketetal:  myalgias[ y ];  joint swelling[  ];  joint erythema[  ];  joint pain[ y ];  back pain[ y ];  Heme/Lymph: bruising[  ];  bleeding[  ];  anemia[  ];  Neuro: TIA[  ];  headaches[  ];  stroke[  ];  vertigo[  ];  seizures[  ];   paresthesias[  ];  difficulty walking[  ];  Psych:depression[  ]; anxiety[  ];  Endocrine: diabetes[ y ];  thyroid dysfunction[  ];  Other:  Past Medical History:  Diagnosis Date  . Allergic rhinitis, cause unspecified 03/13/2013  . Anxiety   . Cerebrovascular disease, unspecified 03/13/2013   Atrophy and small vessel dz noted, MR brain 2009  . Depression   . Diabetes mellitus, type 2 ([place])   . Diverticulosis of colon 03/2010 hosp  . Dyslipidemia   . GERD (gastroesophageal reflux disease)   . Hypertension   . Neuropathy   . OSA on CPAP   . Osteoarthritis of shoulder region    and Knee  . Seizure disorder ([place])    onset 11 years ago; repeated 2013     Current Facility-Administered Medications:  .  acetaminophen (TYLENOL) tablet 650 mg, 650 mg, Oral, Q4H PRN, Robinette Haines, MD .  aspirin chewable tablet 324 mg, 324 mg, Oral, NOW **OR** aspirin suppository 300 mg, 300 mg, Rectal, NOW, Robinette Haines, MD .  Derrill Memo ON 01/06/2020] aspirin EC tablet 81 mg, 81 mg, Oral, Daily, Robinette Haines, MD .  nitroGLYCERIN (NITROSTAT) SL tablet 0.4 mg, 0.4 mg, Sublingual, Q5 Min x 3 PRN, Robinette Haines, MD .  ondansetron [facility]) injection 4 mg, 4 mg,  Intravenous, Q6H PRN, Robinette Haines, MD .  sodium chloride flush (NS) 0.9 % injection 3 mL, 3 mL, Intravenous, Once, Pattricia Boss, MD  Current Outpatient Medications:  .  albuterol (PROAIR HFA) 108 (90 BASE) MCG/ACT inhaler, Inhale 2 puffs into the lungs every 6 (six) hours as needed for wheezing or shortness of breath. , Disp: , Rfl:  .  Alpha-D-Galactosidase (BEANO PO), Take 1-2 tablets by mouth daily as needed (for gas)., Disp: , Rfl:  .  aspirin EC 81 MG tablet, Take 1 tablet (81 mg total) by mouth daily., Disp: 90 tablet, Rfl: 3 .  azelastine (ASTELIN) 0.1 % nasal spray, Place 2 sprays into both nostrils 2 (two) times daily as needed for rhinitis. Use in each nostril as directed, Disp: , Rfl:  .  blood glucose meter kit and supplies KIT, Dispense based on patient and insurance preference. Use up to four times daily as directed. E11.9., Disp: 1 each, Rfl: 0 .  blood glucose meter kit and supplies KIT, Dispense based on patient and insurance preference. Use up to four times daily as directed. (FOR ICD-10: E11.9)., Disp: 1 each, Rfl: 0 .  clonazePAM (KLONOPIN) 0.5 MG tablet, TAKE 1/2 TO 1 TABLET TWICE A DAY AS NEEDED FOR ANXIETY, Disp: 60 tablet, Rfl: 0 .  ezetimibe (ZETIA) 10 MG tablet, TAKE 1 TABLET BY MOUTH EVERY DAY, Disp: 90 tablet, Rfl: 1 .  glucose blood (ONETOUCH ULTRA) test strip, USE UP TO 4 TIMES DAILY AS DIRECTED, Disp: 400 strip, Rfl: 3 .  hydrochlorothiazide (HYDRODIURIL) 25 MG tablet, Take 1 tablet (25 mg total) by mouth daily., Disp: 90 tablet, Rfl: 1 .  JARDIANCE 10 MG TABS tablet, TAKE 1 TABLET BY MOUTH EVERY DAY, Disp: 30 tablet, Rfl: 2 .  Lancets MISC, Use to check blood sugars up to 4 times  daily as directed. E11.9, Disp: 400 each, Rfl: 3 .  linaclotide (LINZESS) 145 MCG CAPS capsule, Take 1 capsule (145 mcg total) by mouth daily as needed (constipation)., Disp: 30 capsule, Rfl: 5 .  lubiprostone ([name]) 24 MCG capsule, Take 24 mcg by mouth 2 (two) times daily with a  meal. , Disp: , Rfl:  .  meclizine (ANTIVERT) 12.5 MG tablet, TAKE 1 TABLET (12.5 MG TOTAL) BY MOUTH 3 (THREE) TIMES DAILY AS NEEDED FOR DIZZINESS., Disp: 30 tablet, Rfl: 0 .  Multiple Vitamin (MULTIVITAMIN) capsule, Take 1 capsule by mouth daily. , Disp: , Rfl:  .  nitroGLYCERIN (NITROSTAT) 0.4 MG SL tablet, Place 1 tablet (0.4 mg total) under the tongue every 5 (five) minutes as needed for chest pain., Disp: 25 tablet, Rfl: 2 .  omeprazole (PRILOSEC) 20 MG capsule, TAKE 1 CAPSULE BY MOUTH EVERY DAY, Disp: 90 capsule, Rfl: 1 .  OXcarbazepine (TRILEPTAL) 150 MG tablet, TAKE 1 TABLET BY MOUTH TWICE A DAY, Disp: 180 tablet, Rfl: 0 .  polyethylene glycol (MIRALAX / GLYCOLAX) packet, Take 17 g by mouth daily. (Patient taking differently: Take 17 g by mouth as needed for moderate constipation. ), Disp: 14 each, Rfl: 0 .  potassium chloride SA (KLOR-CON M20) 20 MEQ tablet, TAKE 1 TABLET (20 MEQ TOTAL) BY MOUTH 3 (THREE) TIMES DAILY., Disp: 270 tablet, Rfl: 1 .  pregabalin (LYRICA) 75 MG capsule, TAKE ONE CAPSULE BY MOUTH THREE TIMES DAILY, Disp: 90 capsule, Rfl: 0 .  Probiotic Product (PROBIOTIC PO), Take 1 capsule by mouth daily after breakfast. , Disp: , Rfl:  .  rosuvastatin (CRESTOR) 5 MG tablet, Take 1 tablet (5 mg total) by mouth daily., Disp: 90 tablet, Rfl: 1 .  Semaglutide,0.25 or 0.5MG/DOS, (OZEMPIC, 0.25 OR 0.5 MG/DOSE,) 2 MG/1.5ML SOPN, Inject 0.5 mg into the skin once a week., Disp: 1 pen, Rfl: 5 .  senna-docusate (SENOKOT-S) 8.6-50 MG tablet, Take 2 tablets by mouth 2 (two) times daily. (Patient taking differently: Take 2 tablets by mouth 2 (two) times daily. Taking 1-2x daily PRN), Disp: 60 tablet, Rfl: 0   Allergies  Allergen Reactions  . Cymbalta [Duloxetine Hcl] Hives and Itching  . Effexor [Venlafaxine] Nausea Only  . Other     "SEEDED" food due to stomach issues  . Statins Nausea And Vomiting  . Penicillins Hives and Rash    Has patient had a PCN reaction causing immediate rash,  facial/tongue/throat swelling, SOB or lightheadedness with hypotension: Yes Has patient had a PCN reaction causing severe rash involving mucus membranes or skin necrosis: No Has patient had a PCN reaction that required hospitalization: No Has patient had a PCN reaction occurring within the last 10 years: Yes If all of the above answers are "NO", then may proceed with Cephalosporin use.     Social History   Socioeconomic History  . Marital status: Widowed    Spouse name: Not on file  . Number of children: 2  . Years of education: Not on file  . Highest education level: Not on file  Occupational History  . Occupation: retired  Tobacco Use  . Smoking status: Former Smoker    Quit date: 10/19/1985    Years since quitting: 34.2  . Smokeless tobacco: Never Used  Vaping Use  . Vaping Use: Never used  Substance and Sexual Activity  . Alcohol use: Yes    Alcohol/week: 0.0 standard drinks    Comment: occasionally   . Drug use: No  . Sexual activity: Never  Other Topics Concern  . Not on file  Social History Narrative   Patient lives in a one story home.  Has 2 children.  Retired from [employer].   Social Determinants of Health   Financial Resource Strain: Medium Risk  . Difficulty of Paying Living Expenses: Somewhat hard  Food Insecurity: No Food Insecurity  . Worried About Charity fundraiser in the Last Year: Never true  . Ran Out of Food in the Last Year: Never true  Transportation Needs: No Transportation Needs  . Lack of Transportation (Medical): No  . Lack of Transportation (Non-Medical): No  Physical Activity:   . Days of Exercise per Week:   . Minutes of Exercise per Session:   Stress:   . Feeling of Stress :   Social Connections:   . Frequency of Communication with Friends and Family:   . Frequency of Social Gatherings with Friends and Family:   . Attends Religious Services:   . Active Member of Clubs or Organizations:   . Attends Archivist Meetings:   Marland Kitchen  Marital Status:   Intimate Partner Violence:   . Fear of Current or Ex-Partner:   . Emotionally Abused:   Marland Kitchen Physically Abused:   . Sexually Abused:     Family History  Problem Relation Age of Onset  . Arthritis Mother   . Heart disease Father   . Arthritis Other        Grandmother  . Diabetes Other        Grandmother    PHYSICAL EXAM: Vitals:   01/05/20 1407 01/05/20 1555  BP: 112/72 109/69  Pulse: (!) 56 (!) 55  Resp: 18 17  Temp: 98.4 F (36.9 C)   SpO2: 100% 100%   General:  Well appearing. No respiratory difficulty HEENT: normal Neck: supple. no JVD. Carotids 2+ bilat; no bruits. No lymphadenopathy or thryomegaly appreciated. Cor: PMI nondisplaced. Regular rate & rhythm. No rubs, gallops or murmurs. Lungs: CTAB Abdomen: soft, nontender, nondistended. No hepatosplenomegaly. No bruits or masses. Good bowel sounds. Extremities: no cyanosis, clubbing, rash, edema Neuro: alert & oriented x 3, cranial nerves grossly intact. moves all 4 extremities w/o difficulty. Affect pleasant.  ECG: Sinus bradycardia  Results for orders placed or performed during the hospital encounter of 01/05/20 (from the past 24 hour(s))  Basic metabolic panel     Status: None   Collection Time: 01/05/20  2:18 PM  Result Value Ref Range   Sodium 137 135 - 145 mmol/L   Potassium 3.5 3.5 - 5.1 mmol/L   Chloride 99 98 - 111 mmol/L   CO2 28 22 - 32 mmol/L   Glucose, Bld 92 70 - 99 mg/dL   BUN 13 8 - 23 mg/dL   Creatinine, Ser 0.87 0.44 - 1.00 mg/dL   Calcium 9.9 8.9 - 10.3 mg/dL   GFR calc non Af Amer >60 >60 mL/min   GFR calc Af Amer >60 >60 mL/min   Anion gap 10 5 - 15  CBC     Status: None   Collection Time: 01/05/20  2:18 PM  Result Value Ref Range   WBC 6.2 4.0 - 10.5 K/uL   RBC 4.73 3.87 - 5.11 MIL/uL   Hemoglobin 14.3 12.0 - 15.0 g/dL   HCT 44.1 36 - 46 %   MCV 93.2 80.0 - 100.0 fL   MCH 30.2 26.0 - 34.0 pg   MCHC 32.4 30.0 - 36.0 g/dL   RDW 13.2 11.5 - 15.5 %  Platelets 248  150 - 400 K/uL   nRBC 0.0 0.0 - 0.2 %  Troponin I (High Sensitivity)     Status: None   Collection Time: 01/05/20  2:18 PM  Result Value Ref Range   Troponin I (High Sensitivity) 3 <18 ng/L   DG Chest 2 View  Result Date: 01/05/2020 CLINICAL DATA:  Diffuse chest pain for 1 day, chest heaviness, shortness of breath EXAM: CHEST - 2 VIEW COMPARISON:  05/26/2018 FINDINGS: The heart size and mediastinal contours are within normal limits. Both lungs are clear. The visualized skeletal structures are unremarkable. IMPRESSION: No active cardiopulmonary disease. Electronically Signed   By: Randa Ngo M.D.   On: 01/05/2020 14:57   Cardiac Cath:12/19  The first diagonal is a large vessel that immediately branches into 2 equally sized subbranches.  The more medial branch contains ostial 90% stenosis.  Left main is widely patent  LAD contains diffuse 30% mid vessel stenosis.  Ostial circumflex contains 50 to 60% eccentric narrowing.  RCA contains mid eccentric 30% narrowing.  RCA also has a diagonal with origin and anterior takeoff.  Normal left ventricular systolic function with EF greater than 60% and normal filling pressures.  ASSESSMENT and PLAN/DISCUSSION: Unstable Angina: New onset chest pain for last week which is worrisome for unstable angina.Will start on ACS protocol but without the plavix and will hold BB since patient is bradycardic and with low normal SBP. Will plan for cath at am.  Hypertension: Patient is having low normal BP. Will not start on antihypertensives and will hold off the BB.  DM: Will start on home meds.  On full dose anticoagulation and hence no DVT prophylaxis.

## 2020-01-05 NOTE — ED Notes (Addendum)
Pt resting comfortably

## 2020-01-05 NOTE — Progress Notes (Signed)
ANTICOAGULATION CONSULT NOTE - Initial Consult  Pharmacy Consult for heparin Indication: atrial fibrillation  Allergies  Allergen Reactions  . Cymbalta [Duloxetine Hcl] Hives and Itching  . Effexor [Venlafaxine] Nausea Only  . Other     "SEEDED" food due to stomach issues  . Statins Nausea And Vomiting  . Penicillins Hives and Rash    Has patient had a PCN reaction causing immediate rash, facial/tongue/throat swelling, SOB or lightheadedness with hypotension: Yes Has patient had a PCN reaction causing severe rash involving mucus membranes or skin necrosis: No Has patient had a PCN reaction that required hospitalization: No Has patient had a PCN reaction occurring within the last 10 years: Yes If all of the above answers are "NO", then may proceed with Cephalosporin use.     Patient Measurements:   Heparin Dosing Weight: 68 kg   Vital Signs: Temp: 98.4 F (36.9 C) (07/11 1407) Temp Source: Oral (07/11 1407) BP: 109/69 (07/11 1555) Pulse Rate: 55 (07/11 1555)  Labs: Recent Labs    01/05/20 1418  HGB 14.3  HCT 44.1  PLT 248  CREATININE 0.87  TROPONINIHS 3    CrCl cannot be calculated (Unknown ideal weight.).   Medical History: Past Medical History:  Diagnosis Date  . Allergic rhinitis, cause unspecified 03/13/2013  . Anxiety   . Cerebrovascular disease, unspecified 03/13/2013   Atrophy and small vessel dz noted, MR brain 2009  . Depression   . Diabetes mellitus, type 2 ([place])   . Diverticulosis of colon 03/2010 hosp  . Dyslipidemia   . GERD (gastroesophageal reflux disease)   . Hypertension   . Neuropathy   . OSA on CPAP   . Osteoarthritis of shoulder region    and Knee  . Seizure disorder ([place])    onset 11 years ago; repeated 2013    Medications:  (Not in a hospital admission)   Assessment: 14 YOF here with substernal chest pain to start IV heparin. H/H and Plt wnl. SCr wnl   Goal of Therapy:  Heparin level 0.3-0.7 units/ml Monitor platelets by  anticoagulation protocol: Yes   Plan:  -Heparin 4000 units IV bolus then start IV heparin at 900 units/hr -F/u 8 hr HL -Monitor daily HL, CBC and s/s of bleeding   Albertina Parr, PharmD., BCPS, BCCCP Clinical Pharmacist Clinical phone for 01/05/20 until 10pm: [phone] If after 10pm, please refer to [facility] for unit-specific pharmacist

## 2020-01-05 NOTE — ED Notes (Signed)
Call son when pt in room.  He is waiting out front.

## 2020-01-05 NOTE — ED Notes (Signed)
Patient ambulated to bathroom with staff, reported dizziness and SOB.

## 2020-01-06 ENCOUNTER — Encounter (HOSPITAL_COMMUNITY): Payer: Self-pay | Admitting: Interventional Cardiology

## 2020-01-06 ENCOUNTER — Encounter (HOSPITAL_COMMUNITY)
Admission: EM | Disposition: A | Discharge status: Home / Self Care | Payer: Self-pay | Source: Home / Self Care | Attending: Emergency Medicine

## 2020-01-06 ENCOUNTER — Inpatient Hospital Stay (HOSPITAL_BASED_OUTPATIENT_CLINIC_OR_DEPARTMENT_OTHER): Payer: HMO

## 2020-01-06 ENCOUNTER — Other Ambulatory Visit: Payer: Self-pay

## 2020-01-06 DIAGNOSIS — Z7984 Long term (current) use of oral hypoglycemic drugs: Secondary | ICD-10-CM | POA: Diagnosis not present

## 2020-01-06 DIAGNOSIS — E119 Type 2 diabetes mellitus without complications: Secondary | ICD-10-CM | POA: Diagnosis not present

## 2020-01-06 DIAGNOSIS — E876 Hypokalemia: Secondary | ICD-10-CM

## 2020-01-06 DIAGNOSIS — I249 Acute ischemic heart disease, unspecified: Secondary | ICD-10-CM

## 2020-01-06 DIAGNOSIS — J449 Chronic obstructive pulmonary disease, unspecified: Secondary | ICD-10-CM | POA: Diagnosis not present

## 2020-01-06 DIAGNOSIS — I119 Hypertensive heart disease without heart failure: Secondary | ICD-10-CM | POA: Diagnosis not present

## 2020-01-06 DIAGNOSIS — Z87891 Personal history of nicotine dependence: Secondary | ICD-10-CM | POA: Diagnosis not present

## 2020-01-06 DIAGNOSIS — R0789 Other chest pain: Secondary | ICD-10-CM | POA: Diagnosis not present

## 2020-01-06 DIAGNOSIS — R079 Chest pain, unspecified: Secondary | ICD-10-CM | POA: Diagnosis not present

## 2020-01-06 DIAGNOSIS — Z20822 Contact with and (suspected) exposure to covid-19: Secondary | ICD-10-CM | POA: Diagnosis not present

## 2020-01-06 DIAGNOSIS — K219 Gastro-esophageal reflux disease without esophagitis: Secondary | ICD-10-CM | POA: Diagnosis not present

## 2020-01-06 DIAGNOSIS — R0602 Shortness of breath: Secondary | ICD-10-CM | POA: Diagnosis not present

## 2020-01-06 DIAGNOSIS — I2510 Atherosclerotic heart disease of native coronary artery without angina pectoris: Secondary | ICD-10-CM | POA: Diagnosis not present

## 2020-01-06 DIAGNOSIS — E785 Hyperlipidemia, unspecified: Secondary | ICD-10-CM | POA: Diagnosis not present

## 2020-01-06 HISTORY — DX: Hypokalemia: E87.6

## 2020-01-06 HISTORY — PX: LEFT HEART CATH AND CORONARY ANGIOGRAPHY: CATH118249

## 2020-01-06 LAB — CBC
HCT: 41.8 % (ref 36.0–46.0)
Hemoglobin: 13.9 g/dL (ref 12.0–15.0)
MCH: 30.4 pg (ref 26.0–34.0)
MCHC: 33.3 g/dL (ref 30.0–36.0)
MCV: 91.5 fL (ref 80.0–100.0)
Platelets: 234 10*3/uL (ref 150–400)
RBC: 4.57 MIL/uL (ref 3.87–5.11)
RDW: 13.2 % (ref 11.5–15.5)
WBC: 7.3 10*3/uL (ref 4.0–10.5)
nRBC: 0 % (ref 0.0–0.2)

## 2020-01-06 LAB — BASIC METABOLIC PANEL
Anion gap: 10 (ref 5–15)
BUN: 14 mg/dL (ref 8–23)
CO2: 28 mmol/L (ref 22–32)
Calcium: 9.7 mg/dL (ref 8.9–10.3)
Chloride: 99 mmol/L (ref 98–111)
Creatinine, Ser: 0.77 mg/dL (ref 0.44–1.00)
GFR calc Af Amer: >60 mL/min (ref >60)
GFR calc non Af Amer: >60 mL/min (ref >60)
Glucose, Bld: 126 mg/dL — ABNORMAL HIGH (ref 70–99)
Potassium: 3.1 mmol/L — ABNORMAL LOW (ref 3.5–5.1)
Sodium: 137 mmol/L (ref 135–145)

## 2020-01-06 LAB — ECHOCARDIOGRAM COMPLETE
Height: 62 in
Weight: 2468.8 oz

## 2020-01-06 LAB — HEPARIN LEVEL (UNFRACTIONATED)
Heparin Unfractionated: 0.85 IU/mL — ABNORMAL HIGH (ref 0.30–0.70)
Heparin Unfractionated: 0.52 IU/mL (ref 0.30–0.70)

## 2020-01-06 LAB — GLUCOSE, CAPILLARY
Glucose-Capillary: 92 mg/dL (ref 70–99)
Glucose-Capillary: 72 mg/dL (ref 70–99)
Glucose-Capillary: 140 mg/dL — ABNORMAL HIGH (ref 70–99)

## 2020-01-06 SURGERY — LEFT HEART CATH AND CORONARY ANGIOGRAPHY
Anesthesia: LOCAL

## 2020-01-06 MED ORDER — LIDOCAINE HCL (PF) 1 % IJ SOLN
INTRAMUSCULAR | Status: DC | PRN
  Administered 2020-01-06: 2 mL

## 2020-01-06 MED ORDER — INSULIN ASPART 100 UNIT/ML ~~LOC~~ SOLN
0.0000 [IU] | Freq: Three times a day (TID) | SUBCUTANEOUS | Status: DC
  Administered 2020-01-06: 2 [IU] via SUBCUTANEOUS

## 2020-01-06 MED ORDER — VERAPAMIL HCL 2.5 MG/ML IV SOLN
INTRAVENOUS | Status: DC | PRN
  Administered 2020-01-06: 10 mL via INTRA_ARTERIAL

## 2020-01-06 MED ORDER — HEPARIN SODIUM (PORCINE) 5000 UNIT/ML IJ SOLN: 5000.0000 [IU] | Freq: Three times a day (TID) | INTRAMUSCULAR | Status: DC

## 2020-01-06 MED ORDER — SODIUM CHLORIDE 0.9 % WEIGHT BASED INFUSION
1.0000 mL/kg/h | INTRAVENOUS | Status: DC
  Administered 2020-01-06: 1 mL/kg/h via INTRAVENOUS

## 2020-01-06 MED ORDER — ONDANSETRON HCL 4 MG/2ML IJ SOLN: 4.0000 mg | Freq: Four times a day (QID) | INTRAMUSCULAR | Status: DC | PRN

## 2020-01-06 MED ORDER — MECLIZINE HCL 25 MG PO TABS: 12.5000 mg | ORAL_TABLET | Freq: Three times a day (TID) | ORAL | Status: DC | PRN

## 2020-01-06 MED ORDER — CLONAZEPAM 0.25 MG PO TBDP: 0.2500 mg | ORAL_TABLET | Freq: Two times a day (BID) | ORAL | Status: DC | PRN

## 2020-01-06 MED ORDER — HEPARIN (PORCINE) IN NACL 1000-0.9 UT/500ML-% IV SOLN
INTRAVENOUS | Status: AC
  Filled 2020-01-06: qty 500

## 2020-01-06 MED ORDER — ASPIRIN 81 MG PO CHEW: 81.0000 mg | CHEWABLE_TABLET | Freq: Every day | ORAL | Status: DC

## 2020-01-06 MED ORDER — LIDOCAINE HCL (PF) 1 % IJ SOLN
INTRAMUSCULAR | Status: AC
  Filled 2020-01-06: qty 30

## 2020-01-06 MED ORDER — SODIUM CHLORIDE 0.9 % IV SOLN: 250.0000 mL | INTRAVENOUS | Status: DC | PRN

## 2020-01-06 MED ORDER — POLYVINYL ALCOHOL-POVIDONE PF 1.4-0.6 % OP SOLN: 1.0000 [drp] | Freq: Every day | OPHTHALMIC | Status: DC | PRN

## 2020-01-06 MED ORDER — ALBUTEROL SULFATE (2.5 MG/3ML) 0.083% IN NEBU: 2.5000 mg | INHALATION_SOLUTION | Freq: Four times a day (QID) | RESPIRATORY_TRACT | Status: DC | PRN

## 2020-01-06 MED ORDER — SENNOSIDES-DOCUSATE SODIUM 8.6-50 MG PO TABS: 1.0000 | ORAL_TABLET | Freq: Two times a day (BID) | ORAL | Status: DC | PRN

## 2020-01-06 MED ORDER — ASPIRIN 81 MG PO CHEW
81.0000 mg | CHEWABLE_TABLET | ORAL | Status: AC
  Administered 2020-01-06: 81 mg via ORAL

## 2020-01-06 MED ORDER — ACETAMINOPHEN 325 MG PO TABS: 650.0000 mg | ORAL_TABLET | ORAL | Status: DC | PRN

## 2020-01-06 MED ORDER — OXCARBAZEPINE 150 MG PO TABS
150.0000 mg | ORAL_TABLET | Freq: Two times a day (BID) | ORAL | Status: DC
  Filled 2020-01-06: qty 1

## 2020-01-06 MED ORDER — LINACLOTIDE 145 MCG PO CAPS
145.0000 ug | ORAL_CAPSULE | Freq: Every day | ORAL | Status: DC | PRN
  Filled 2020-01-06: qty 1

## 2020-01-06 MED ORDER — SODIUM CHLORIDE 0.9% FLUSH
3.0000 mL | Freq: Two times a day (BID) | INTRAVENOUS | Status: DC
  Administered 2020-01-06: 3 mL via INTRAVENOUS

## 2020-01-06 MED ORDER — PANTOPRAZOLE SODIUM 40 MG PO TBEC
40.0000 mg | DELAYED_RELEASE_TABLET | Freq: Every day | ORAL | Status: DC
  Administered 2020-01-06: 40 mg via ORAL
  Filled 2020-01-06 (×2): qty 1

## 2020-01-06 MED ORDER — VERAPAMIL HCL 2.5 MG/ML IV SOLN
INTRAVENOUS | Status: AC
  Filled 2020-01-06: qty 2

## 2020-01-06 MED ORDER — ROSUVASTATIN CALCIUM 5 MG PO TABS
5.0000 mg | ORAL_TABLET | Freq: Every day | ORAL | Status: DC
  Administered 2020-01-06: 5 mg via ORAL
  Filled 2020-01-06: qty 1

## 2020-01-06 MED ORDER — NITROGLYCERIN 1 MG/10 ML FOR IR/CATH LAB
INTRA_ARTERIAL | Status: AC
  Filled 2020-01-06: qty 10

## 2020-01-06 MED ORDER — SODIUM CHLORIDE 0.9 % WEIGHT BASED INFUSION
3.0000 mL/kg/h | INTRAVENOUS | Status: DC
  Administered 2020-01-06: 3 mL/kg/h via INTRAVENOUS

## 2020-01-06 MED ORDER — OXYCODONE HCL 5 MG PO TABS: 5.0000 mg | ORAL_TABLET | ORAL | Status: DC | PRN

## 2020-01-06 MED ORDER — SODIUM CHLORIDE 0.9 % IV SOLN: INTRAVENOUS | Status: AC

## 2020-01-06 MED ORDER — PREGABALIN 75 MG PO CAPS
75.0000 mg | ORAL_CAPSULE | Freq: Three times a day (TID) | ORAL | Status: DC
  Administered 2020-01-06 (×2): 75 mg via ORAL
  Filled 2020-01-06: qty 1

## 2020-01-06 MED ORDER — SODIUM CHLORIDE 0.9 % IV SOLN: INTRAVENOUS | Status: DC | PRN

## 2020-01-06 MED ORDER — LUBIPROSTONE 24 MCG PO CAPS
24.0000 ug | ORAL_CAPSULE | Freq: Two times a day (BID) | ORAL | Status: DC
  Administered 2020-01-06: 24 ug via ORAL
  Filled 2020-01-06 (×2): qty 1

## 2020-01-06 MED ORDER — ADULT MULTIVITAMIN W/MINERALS CH: 1.0000 | ORAL_TABLET | Freq: Every day | ORAL | Status: DC

## 2020-01-06 MED ORDER — AZELASTINE HCL 0.1 % NA SOLN: 2.0000 | Freq: Two times a day (BID) | NASAL | Status: DC | PRN

## 2020-01-06 MED ORDER — HEPARIN SODIUM (PORCINE) 1000 UNIT/ML IJ SOLN
INTRAMUSCULAR | Status: AC
  Filled 2020-01-06: qty 1

## 2020-01-06 MED ORDER — HEPARIN SODIUM (PORCINE) 1000 UNIT/ML IJ SOLN
INTRAMUSCULAR | Status: DC | PRN
  Administered 2020-01-06: 3500 [IU] via INTRAVENOUS

## 2020-01-06 MED ORDER — MIDAZOLAM HCL 2 MG/2ML IJ SOLN
INTRAMUSCULAR | Status: AC
  Filled 2020-01-06: qty 2

## 2020-01-06 MED ORDER — PROBIOTIC PO CAPS: 1.0000 | ORAL_CAPSULE | Freq: Every day | ORAL | Status: DC

## 2020-01-06 MED ORDER — LABETALOL HCL 5 MG/ML IV SOLN: 10.0000 mg | INTRAVENOUS | Status: DC | PRN

## 2020-01-06 MED ORDER — HEPARIN (PORCINE) IN NACL 1000-0.9 UT/500ML-% IV SOLN
INTRAVENOUS | Status: DC | PRN
  Administered 2020-01-06 (×2): 500 mL

## 2020-01-06 MED ORDER — SODIUM CHLORIDE 0.9% FLUSH: 3.0000 mL | INTRAVENOUS | Status: DC | PRN

## 2020-01-06 MED ORDER — OXCARBAZEPINE 150 MG PO TABS
150.0000 mg | ORAL_TABLET | Freq: Two times a day (BID) | ORAL | Status: DC
  Administered 2020-01-06: 150 mg via ORAL
  Filled 2020-01-06 (×3): qty 1

## 2020-01-06 MED ORDER — EZETIMIBE 10 MG PO TABS
10.0000 mg | ORAL_TABLET | Freq: Every evening | ORAL | Status: DC
  Administered 2020-01-06: 10 mg via ORAL
  Filled 2020-01-06: qty 1

## 2020-01-06 MED ORDER — PREGABALIN 75 MG PO CAPS
75.0000 mg | ORAL_CAPSULE | Freq: Three times a day (TID) | ORAL | Status: DC
  Filled 2020-01-06: qty 1

## 2020-01-06 MED ORDER — POTASSIUM CHLORIDE CRYS ER 20 MEQ PO TBCR
40.0000 meq | EXTENDED_RELEASE_TABLET | Freq: Once | ORAL | Status: AC
  Administered 2020-01-06: 40 meq via ORAL
  Filled 2020-01-06: qty 2

## 2020-01-06 MED ORDER — SEMAGLUTIDE(0.25 OR 0.5MG/DOS) 2 MG/1.5ML ~~LOC~~ SOPN: 0.5000 mg | PEN_INJECTOR | SUBCUTANEOUS | Status: DC

## 2020-01-06 MED ORDER — EMPAGLIFLOZIN 10 MG PO TABS
10.0000 mg | ORAL_TABLET | Freq: Every day | ORAL | Status: DC
  Filled 2020-01-06: qty 1

## 2020-01-06 MED ORDER — FENTANYL CITRATE (PF) 100 MCG/2ML IJ SOLN
INTRAMUSCULAR | Status: DC | PRN
  Administered 2020-01-06: 25 ug via INTRAVENOUS

## 2020-01-06 MED ORDER — IOHEXOL 350 MG/ML SOLN
INTRAVENOUS | Status: DC | PRN
  Administered 2020-01-06: 75 mL

## 2020-01-06 MED ORDER — FENTANYL CITRATE (PF) 100 MCG/2ML IJ SOLN
INTRAMUSCULAR | Status: AC
  Filled 2020-01-06: qty 2

## 2020-01-06 MED ORDER — NITROGLYCERIN 1 MG/10 ML FOR IR/CATH LAB
INTRA_ARTERIAL | Status: DC | PRN
  Administered 2020-01-06: 150 ug

## 2020-01-06 MED ORDER — MIDAZOLAM HCL 2 MG/2ML IJ SOLN
INTRAMUSCULAR | Status: DC | PRN
  Administered 2020-01-06 (×2): 1 mg via INTRAVENOUS

## 2020-01-06 MED ORDER — HYDRALAZINE HCL 20 MG/ML IJ SOLN: 10.0000 mg | INTRAMUSCULAR | Status: DC | PRN

## 2020-01-06 SURGICAL SUPPLY — 10 items
CATH 5FR JL3.5 JR4 ANG PIG MP (CATHETERS) ×1 IMPLANT
DEVICE RAD COMP TR BAND LRG (VASCULAR PRODUCTS) ×1 IMPLANT
GLIDESHEATH SLEND A-KIT 6F 22G (SHEATH) ×1 IMPLANT
GUIDEWIRE INQWIRE 1.5J.035X260 (WIRE) IMPLANT
INQWIRE 1.5J .035X260CM (WIRE) ×2
KIT HEART LEFT (KITS) ×2 IMPLANT
PACK CARDIAC CATHETERIZATION (CUSTOM PROCEDURE TRAY) ×2 IMPLANT
SHEATH PROBE COVER 6X72 (BAG) ×1 IMPLANT
TRANSDUCER W/STOPCOCK (MISCELLANEOUS) ×2 IMPLANT
TUBING CIL FLEX 10 FLL-RA (TUBING) ×2 IMPLANT

## 2020-01-06 NOTE — Patient Outreach (Signed)
  [facility]) Care Management Chronic Special Needs Program    01/06/2020  Name: [name], DOB: 07/31/1940  MRN: [id number]   [name] is enrolled in a chronic special needs plan for Diabetes. Client admitted to [facility] on 01/05/20 with chest pain. Individualized care plan sent to [facility] for admission  [facility] will continue to follow post discharge.  Peter Garter RN, Jackquline Denmark, CDE Chronic Care Management Coordinator [facility] Care Management [phone]

## 2020-01-06 NOTE — Progress Notes (Signed)
ANTICOAGULATION CONSULT NOTE   Pharmacy Consult for heparin Indication: atrial fibrillation  Assessment: 9 YOF here with substernal chest pain to start IV heparin. H/H and Plt wnl. SCr wnl  Initial heparin level 0.85 units/ml.  Level drawn ~ 5 hours after bolus  Goal of Therapy:  Heparin level 0.3-0.7 units/ml Monitor platelets by anticoagulation protocol: Yes   Plan:  -Decrease heparin to 800 units/hr -F/u 6 hr HL -Monitor daily HL, CBC and s/s of bleeding   Thanks for allowing pharmacy to be a part of this patient's care.  Excell Seltzer, PharmD Clinical Pharmacist

## 2020-01-06 NOTE — Progress Notes (Signed)
Patient returned to unit from cath lab.

## 2020-01-06 NOTE — Progress Notes (Signed)
F/u EKG unrevealing. Continue with plan as discussed. Notified nurse to give AM meds including neuropathy regimen which hopefully will help.Harles Evetts PA-C

## 2020-01-06 NOTE — H&P (View-Only) (Signed)
Progress Note  Patient Name: [name] Date of Encounter: 01/06/2020  Primary Cardiologist: Sinclair Grooms, MD  Subjective   Somewhat vague historian, frequently comes back to neuropathy in hands and feet. Upset that she has not gotten her regularly scheduled meds yet. She reports after she turned 79 on 12/21/19 she started to notice "things changing." More pressure in chest, more tired with exertion. Atypical chest pains come and go at random without specific provocation, improved by staying on schedule with medications.  Currently reports chest pain currently around L breast, mostly tender to palpation, rated 81/0, but took several questions to arrive at this answer as she feels most of her symptoms with tingling hands and feet. No pleuritic discomfort reported.  Inpatient Medications    Scheduled Meds: . aspirin EC  81 mg Oral Daily  . empagliflozin  10 mg Oral Daily  . multivitamin with minerals  1 tablet Oral QPC lunch  . pantoprazole  40 mg Oral Daily  . pregabalin  75 mg Oral TID  . Semaglutide(0.25 or 0.5MG /DOS)  0.5067 mg Subcutaneous Weekly  . sodium chloride flush  3 mL Intravenous Once   Continuous Infusions: . heparin 800 Units/hr (01/06/20 0427)   PRN Meds: acetaminophen, nitroGLYCERIN, ondansetron (ZOFRAN) IV   Vital Signs    Vitals:   01/05/20 1945 01/05/20 2003 01/06/20 0127 01/06/20 0428  BP:  (!) 145/69 105/70 105/64  Pulse:  (!) 57 (!) 53 (!) 51  Resp:  14 18 18   Temp: 98.8 F (37.1 C) 97.9 F (36.6 C) 98 F (36.7 C) 97.9 F (36.6 C)  TempSrc: Oral Oral Oral Oral  SpO2:   100% 98%  Weight:  66.9 kg 70 kg   Height:  5\' 2"  (1.575 m) 5\' 2"  (1.575 m)     Intake/Output Summary (Last 24 hours) at 01/06/2020 0831 Last data filed at 01/06/2020 0700 Gross per 24 hour  Intake 835.16 ml  Output 550 ml  Net 285.16 ml   Last 3 Weights 01/06/2020 01/05/2020 08/12/2019  Weight (lbs) 154 lb 4.8 oz 147 lb 8 oz 151 lb  Weight (kg) 69.99 kg 66.906  kg 68.493 kg     Telemetry    SB 50s-NSR 60s, one PVC - Personally Reviewed  ECG    Yesterday - SB 55bpm baseline wander on 2nd beat making interpretation challenging but otherwise unchanged if not counting this - Personally Reviewed  Physical Exam   GEN: Does not appear to be in any acute distress or pain HEENT: Normocephalic, atraumatic, sclera non-icteric. Neck: No JVD or bruits. Cardiac: RRR no murmurs, rubs, or gallops.  Radials/DP/PT 1+ and equal bilaterally.  Respiratory: Clear to auscultation bilaterally. Breathing is unlabored. GI: Soft, nontender, non-distended, BS +x 4. MS: no deformity. Extremities: No clubbing or cyanosis. No edema. Distal pedal pulses are 2+ and equal bilaterally. Neuro:  AAOx3. Follows commands. Psych:  Responds to questions appropriately with a normal affect.  Labs    High Sensitivity Troponin:   Recent Labs  Lab 01/05/20 1418 01/05/20 1728  TROPONINIHS 3 4      Cardiac EnzymesNo results for input(s): TROPONINI in the last 168 hours. No results for input(s): TROPIPOC in the last 168 hours.   Chemistry Recent Labs  Lab 01/05/20 1418 01/06/20 0319  NA 137 137  K 3.5 3.1*  CL 99 99  CO2 28 28  GLUCOSE 92 126*  BUN 13 14  CREATININE 0.87 0.77  CALCIUM 9.9 9.7  GFRNONAA >60 >60  GFRAA >60 >60  ANIONGAP 10 10     Hematology Recent Labs  Lab 01/05/20 1418 01/06/20 0319  WBC 6.2 7.3  RBC 4.73 4.57  HGB 14.3 13.9  HCT 44.1 41.8  MCV 93.2 91.5  MCH 30.2 30.4  MCHC 32.4 33.3  RDW 13.2 13.2  PLT 248 234    BNPNo results for input(s): BNP, PROBNP in the last 168 hours.   DDimer No results for input(s): DDIMER in the last 168 hours.   Radiology    DG Chest 2 View  Result Date: 01/05/2020 CLINICAL DATA:  Diffuse chest pain for 1 day, chest heaviness, shortness of breath EXAM: CHEST - 2 VIEW COMPARISON:  05/26/2018 FINDINGS: The heart size and mediastinal contours are within normal limits. Both lungs are clear. The  visualized skeletal structures are unremarkable. IMPRESSION: No active cardiopulmonary disease. Electronically Signed   By: Randa Ngo M.D.   On: 01/05/2020 14:57    Cardiac Studies   LHC 05/2018  The first diagonal is a large vessel that immediately branches into 2 equally sized subbranches.  The more medial branch contains ostial 90% stenosis.  Left main is widely patent  LAD contains diffuse 30% mid vessel stenosis.  Ostial circumflex contains 50 to 60% eccentric narrowing.  RCA contains mid eccentric 30% narrowing.  RCA also has a diagonal with origin and anterior takeoff.  Normal left ventricular systolic function with EF greater than 60% and normal filling pressures.  RECOMMENDATIONS:   Aspirin  Statin therapy  Diagonal branch is not a good interventional target due to ostial location/bifurcation.   Patient Profile     79 y.o. female with multivessel CAD by cath 2019 (managed medically at that time), COPD, HTN, DM, tobacco abuse, dyslipidemia, GERD, neuropathy, OSA, prior seizure disorder, anxiety, former tobacco use who presented to [facility] with stuttering CP concerning for unstable angina.  Assessment & Plan    1. CAD, here with possible unstable angina - chest pain with mixed atypical/typical features. Troponins negative. Cath recommended by cardiology fellow overnight. - Discussed case with Dr. Angelena Form who is in agreement  - Unfortunately she had diet ordered overnight for this morning so will keep NPO from this point forward - Risks and benefits of cardiac catheterization have been discussed with the patient.  These include bleeding, infection, kidney damage, stroke, heart attack, death.  The patient understands these risks and is willing to proceed. - continue ASA, statin, Zetia, heparin - not on BB given baseline sinus bradycardia - give scheduled meds now except those as outlined below - repeat EKG now given recurrent waxing/waning CP - this episode is  specifically reproducible with palpation, BP otherwise too soft for aggressive antianginal titration  2. Essential HTN - BP has been low normal so home HCTZ on hold - continue to follow  3. Dyslipidemia - resume home statin and Zetia - lipids in AM  4. Diabetes mellitus - hold oral agents since NPO - we do not carry semaglutide on formulary so plan resume at dc - add SSI  5. Hypokalemia - she was on KCl at home on context of HCTZ. - Since HCTZ was held, will also hold standing potassium but give 77meq KCl now. Recheck value in AM.  6. H/o constipation  - will resume home regimen  For questions or updates, please contact [place] Please consult [email or web address] for contact info under Cardiology/STEMI.  Signed, Charlie Pitter, PA-C 01/06/2020, 8:31 AM    I have personally seen and examined this patient.  I agree with the assessment and plan as outlined above. She is admitted with chest pain with typical and atypical features. She is a poor historian and also describes pain in her head and neck since a fall in 2019 as well as on/off chest pain since then. Her chest wall is tender to palpation on the left.   Troponin is negative. EKG without ischemic changes. She is known to have moderate CAD with 60% ostial Circumflex stenosis by cath in 2019 and mild LAD and RCA disease with high grade ostial Diagonal stenosis.   It is hard to sort out her symptoms. Will plan cardiac cath later today to exclude progression of CAD. This will be an afternoon cath as she had a partial breakfast.    I have reviewed the risks, indications, and alternatives to cardiac catheterization, possible angioplasty, and stenting with the patient. Risks include but are not limited to bleeding, infection, vascular injury, stroke, myocardial infection, arrhythmia, kidney injury, radiation-related injury in the case of prolonged fluoroscopy use, emergency cardiac surgery, and death. The patient understands the risks of  serious complication is 1-2 in 4970 with diagnostic cardiac cath and 1-2% or less with angioplasty/stenting.  Lauree Chandler 01/06/2020 9:18 AM

## 2020-01-06 NOTE — Progress Notes (Addendum)
Progress Note  Patient Name: [name] Date of Encounter: 01/06/2020  Primary Cardiologist: Sinclair Grooms, MD  Subjective   Somewhat vague historian, frequently comes back to neuropathy in hands and feet. Upset that she has not gotten her regularly scheduled meds yet. She reports after she turned 79 on 12/21/19 she started to notice "things changing." More pressure in chest, more tired with exertion. Atypical chest pains come and go at random without specific provocation, improved by staying on schedule with medications.  Currently reports chest pain currently around L breast, mostly tender to palpation, rated 81/0, but took several questions to arrive at this answer as she feels most of her symptoms with tingling hands and feet. No pleuritic discomfort reported.  Inpatient Medications    Scheduled Meds: . aspirin EC  81 mg Oral Daily  . empagliflozin  10 mg Oral Daily  . multivitamin with minerals  1 tablet Oral QPC lunch  . pantoprazole  40 mg Oral Daily  . pregabalin  75 mg Oral TID  . Semaglutide(0.25 or 0.5MG /DOS)  0.5067 mg Subcutaneous Weekly  . sodium chloride flush  3 mL Intravenous Once   Continuous Infusions: . heparin 800 Units/hr (01/06/20 0427)   PRN Meds: acetaminophen, nitroGLYCERIN, ondansetron (ZOFRAN) IV   Vital Signs    Vitals:   01/05/20 1945 01/05/20 2003 01/06/20 0127 01/06/20 0428  BP:  (!) 145/69 105/70 105/64  Pulse:  (!) 57 (!) 53 (!) 51  Resp:  14 18 18   Temp: 98.8 F (37.1 C) 97.9 F (36.6 C) 98 F (36.7 C) 97.9 F (36.6 C)  TempSrc: Oral Oral Oral Oral  SpO2:   100% 98%  Weight:  66.9 kg 70 kg   Height:  5\' 2"  (1.575 m) 5\' 2"  (1.575 m)     Intake/Output Summary (Last 24 hours) at 01/06/2020 0831 Last data filed at 01/06/2020 0700 Gross per 24 hour  Intake 835.16 ml  Output 550 ml  Net 285.16 ml   Last 3 Weights 01/06/2020 01/05/2020 08/12/2019  Weight (lbs) 154 lb 4.8 oz 147 lb 8 oz 151 lb  Weight (kg) 69.99 kg 66.906  kg 68.493 kg     Telemetry    SB 50s-NSR 60s, one PVC - Personally Reviewed  ECG    Yesterday - SB 55bpm baseline wander on 2nd beat making interpretation challenging but otherwise unchanged if not counting this - Personally Reviewed  Physical Exam   GEN: Does not appear to be in any acute distress or pain HEENT: Normocephalic, atraumatic, sclera non-icteric. Neck: No JVD or bruits. Cardiac: RRR no murmurs, rubs, or gallops.  Radials/DP/PT 1+ and equal bilaterally.  Respiratory: Clear to auscultation bilaterally. Breathing is unlabored. GI: Soft, nontender, non-distended, BS +x 4. MS: no deformity. Extremities: No clubbing or cyanosis. No edema. Distal pedal pulses are 2+ and equal bilaterally. Neuro:  AAOx3. Follows commands. Psych:  Responds to questions appropriately with a normal affect.  Labs    High Sensitivity Troponin:   Recent Labs  Lab 01/05/20 1418 01/05/20 1728  TROPONINIHS 3 4      Cardiac EnzymesNo results for input(s): TROPONINI in the last 168 hours. No results for input(s): TROPIPOC in the last 168 hours.   Chemistry Recent Labs  Lab 01/05/20 1418 01/06/20 0319  NA 137 137  K 3.5 3.1*  CL 99 99  CO2 28 28  GLUCOSE 92 126*  BUN 13 14  CREATININE 0.87 0.77  CALCIUM 9.9 9.7  GFRNONAA >60 >60  GFRAA >60 >60  ANIONGAP 10 10     Hematology Recent Labs  Lab 01/05/20 1418 01/06/20 0319  WBC 6.2 7.3  RBC 4.73 4.57  HGB 14.3 13.9  HCT 44.1 41.8  MCV 93.2 91.5  MCH 30.2 30.4  MCHC 32.4 33.3  RDW 13.2 13.2  PLT 248 234    BNPNo results for input(s): BNP, PROBNP in the last 168 hours.   DDimer No results for input(s): DDIMER in the last 168 hours.   Radiology    DG Chest 2 View  Result Date: 01/05/2020 CLINICAL DATA:  Diffuse chest pain for 1 day, chest heaviness, shortness of breath EXAM: CHEST - 2 VIEW COMPARISON:  05/26/2018 FINDINGS: The heart size and mediastinal contours are within normal limits. Both lungs are clear. The  visualized skeletal structures are unremarkable. IMPRESSION: No active cardiopulmonary disease. Electronically Signed   By: Randa Ngo M.D.   On: 01/05/2020 14:57    Cardiac Studies   LHC 05/2018  The first diagonal is a large vessel that immediately branches into 2 equally sized subbranches.  The more medial branch contains ostial 90% stenosis.  Left main is widely patent  LAD contains diffuse 30% mid vessel stenosis.  Ostial circumflex contains 50 to 60% eccentric narrowing.  RCA contains mid eccentric 30% narrowing.  RCA also has a diagonal with origin and anterior takeoff.  Normal left ventricular systolic function with EF greater than 60% and normal filling pressures.  RECOMMENDATIONS:   Aspirin  Statin therapy  Diagonal branch is not a good interventional target due to ostial location/bifurcation.   Patient Profile     79 y.o. female with multivessel CAD by cath 2019 (managed medically at that time), COPD, HTN, DM, tobacco abuse, dyslipidemia, GERD, neuropathy, OSA, prior seizure disorder, anxiety, former tobacco use who presented to [facility] with stuttering CP concerning for unstable angina.  Assessment & Plan    1. CAD, here with possible unstable angina - chest pain with mixed atypical/typical features. Troponins negative. Cath recommended by cardiology fellow overnight. - Discussed case with Dr. Angelena Form who is in agreement  - Unfortunately she had diet ordered overnight for this morning so will keep NPO from this point forward - Risks and benefits of cardiac catheterization have been discussed with the patient.  These include bleeding, infection, kidney damage, stroke, heart attack, death.  The patient understands these risks and is willing to proceed. - continue ASA, statin, Zetia, heparin - not on BB given baseline sinus bradycardia - give scheduled meds now except those as outlined below - repeat EKG now given recurrent waxing/waning CP - this episode is  specifically reproducible with palpation, BP otherwise too soft for aggressive antianginal titration  2. Essential HTN - BP has been low normal so home HCTZ on hold - continue to follow  3. Dyslipidemia - resume home statin and Zetia - lipids in AM  4. Diabetes mellitus - hold oral agents since NPO - we do not carry semaglutide on formulary so plan resume at dc - add SSI  5. Hypokalemia - she was on KCl at home on context of HCTZ. - Since HCTZ was held, will also hold standing potassium but give 53meq KCl now. Recheck value in AM.  6. H/o constipation  - will resume home regimen  For questions or updates, please contact [place] Please consult [email or web address] for contact info under Cardiology/STEMI.  Signed, Charlie Pitter, PA-C 01/06/2020, 8:31 AM    I have personally seen and examined this patient.  I agree with the assessment and plan as outlined above. She is admitted with chest pain with typical and atypical features. She is a poor historian and also describes pain in her head and neck since a fall in 2019 as well as on/off chest pain since then. Her chest wall is tender to palpation on the left.   Troponin is negative. EKG without ischemic changes. She is known to have moderate CAD with 60% ostial Circumflex stenosis by cath in 2019 and mild LAD and RCA disease with high grade ostial Diagonal stenosis.   It is hard to sort out her symptoms. Will plan cardiac cath later today to exclude progression of CAD. This will be an afternoon cath as she had a partial breakfast.    I have reviewed the risks, indications, and alternatives to cardiac catheterization, possible angioplasty, and stenting with the patient. Risks include but are not limited to bleeding, infection, vascular injury, stroke, myocardial infection, arrhythmia, kidney injury, radiation-related injury in the case of prolonged fluoroscopy use, emergency cardiac surgery, and death. The patient understands the risks of  serious complication is 1-2 in 4268 with diagnostic cardiac cath and 1-2% or less with angioplasty/stenting.  [name] 01/06/2020 9:18 AM

## 2020-01-06 NOTE — Care Management CC44 (Signed)
Condition Code 44 Documentation Completed  Patient Details  Name: [name] MRN: [id number] Date of Birth: [birth year]   Condition Code 44 given:  Yes Patient signature on Condition Code 44 notice:  Yes Documentation of 2 MD's agreement:    Code 44 added to claim:  Yes    Ninfa Meeker, RN 01/06/2020, 4:37 PM

## 2020-01-06 NOTE — CV Procedure (Addendum)
   Left heart cath with coronary angiography via right radial approach.  Complaining of 6 of 10 chest pain at time of procedure.  Cath demonstrates eccentric ostial 50% circumflex unchanged from 2019, ramus intermedius that branches into the medial and lateral subbranches.  The more anterior branch contains ostial 75% stenosis.  This appears to be improved compared to 2019.  Calcified distal left main and LAD/marginal territory.  The LAD is widely patent  Ramus intermedius is widely patent other than as noted above  Ostial eccentric 60-75 % circumflex depending upon view.  There is calcification in this region.  Appears unchanged from prior in 2019.  Anterior origin of RCA without significant obstruction.  Normal LV function.  Normal LVEDP.  EF estimated to be 60%.

## 2020-01-06 NOTE — Progress Notes (Signed)
Patient off of unit to cath lab. 

## 2020-01-06 NOTE — Progress Notes (Signed)
  Echocardiogram 2D Echocardiogram has been performed.  Bobbye Charleston 01/06/2020, 10:55 AM

## 2020-01-06 NOTE — Progress Notes (Signed)
Discharge education and medication education given to patient and daughter with teach back. Education on increasing activity slowly, low sodium heart healthy diet, when to call MD, and radial site care instructions given.  All questions and concerns answered. Peripheral IV and telemetry leads removed. All patient belongings given to patient. Patient transported to main entrance by nurse tech via wheelchair.

## 2020-01-06 NOTE — Discharge Summary (Addendum)
Discharge Summary    Patient ID: [name] MRN: [id number]; DOB: Jun 04, 1941  Admit date: 01/05/2020 Discharge date: 01/06/2020  Primary Care Provider: Binnie Rail, MD  Primary Cardiologist: Sinclair Grooms, MD  Primary Electrophysiologist:  None   Discharge Diagnoses    Principal Problem:   Chest pain Active Problems:   Dyslipidemia   Essential hypertension   Diabetes ([place])   CAD (coronary artery disease)   Hypokalemia   Diagnostic Studies/Procedures    LHC 01/06/20 Calcified left main, LAD, and proximal circumflex. Widely patent left main Widely patent LAD Large branching ramus intermedius with ostial 75% narrowing in the more anterior subbranch, unchanged from 2019 Tortuous circumflex with ostial 65 to 75% stenosis, unchanged from prior.  The ostial vessel was calcified. Anterior origin right coronary.  Widely patent without obstructive disease. Hyperdynamic left ventricular systolic function with EF greater than 65%.  EDP is normal.   RECOMMENDATIONS:  Persisting substernal chest pressure throughout the procedure with anatomy not demonstrating disease that could account for pain at rest.  Images are similar to 2019.  She does have what would be a difficult to treat branch of the ramus intermedius and also ostial circumflex.  These regions are unchanged compared to 2019. Aggressive risk factor modification. Consider alternative explanations for continuous chest discomfort in absence of EKG findings and enzyme elevation.   2D Echo 01/06/20 IMPRESSIONS  1. Left ventricular ejection fraction, by estimation, is 60 to 65%. The  left ventricle has normal function. The left ventricle has no regional  wall motion abnormalities. There is mild left ventricular hypertrophy.  Left ventricular diastolic parameters  are consistent with Grade I diastolic dysfunction (impaired relaxation).   2. Right ventricular systolic function is normal. The right ventricular  size  is normal. Tricuspid regurgitation signal is inadequate for assessing  PA pressure.   3. The mitral valve is normal in structure. Trivial mitral valve  regurgitation. No evidence of mitral stenosis.   4. The aortic valve is tricuspid. Aortic valve regurgitation is not  visualized. Mild aortic valve sclerosis is present, with no evidence of  aortic valve stenosis.   5. The inferior vena cava is normal in size with greater than 50%  respiratory variability   _____________   History of Present Illness     [name] is a 79 y.o. female with multivessel CAD by cath 2019 (managed medically at that time), COPD, HTN, DM, tobacco abuse, dyslipidemia, GERD, neuropathy, OSA, prior seizure disorder, anxiety, former tobacco use who presented to [facility] with stuttering CP. She was somewhat of a vague historian, frequently reverting back to discussing the neuropathy in her hands/feet. She lives with her son and stated that she does not want to worry her children too much which is why she tried to underplay her symptoms. She reports after she turned 79 on 12/21/19 she started to notice "things changing." She felt more pressure in chest and more tired with exertion. She also developed atypical chest pains that would come and go at random without specific provocation, some sharp, some dull, mostly improved by staying on schedule with her chronic medications which include pharmacologic therapy for neuropathic pain. She has had a fall previously and she has constant msk pain and also had some tenderness to palpation of the chest wall. EKG was nonacute and initial troponin was negative. She was admitted for further evaluation.   Hospital Course     1. CAD, here with chest pain ultimately felt musculoskeletal - chest  pain with mixed atypical/typical features. Troponins remained negative. She was placed on scheduled (non-PRN) PPI  - 2D echo today showed EF 60-65%, mild LVH, grade 1 DD, no significant valvular  disease - given recurring discomfort, she underwent cardiac catheterization today showing similar findings to 2019 with patent LM and LAD, large branching ramus with 75% ostial narrowing, 65-75% tortuous Cx, parent RCA, LVEF >65% and normal LVEDP - she had persisting substernal chest pressure throughout the procedure with anatomy not demonstrating disease that could account for pain at rest - she was not tachycardic, tachypneic or hypoxic and had no pleuritic symptoms - chest pain was felt musculoskeletal and therefore she was advised to follow up with PCP - PPI was continued daily rather than as-needed - she is not on BB given baseline sinus bradycardia  2. Essential HTN - blood pressure was soft while admitted, so HCTZ was held. We will continue to hold at discharge (along with scheduled KCl that she was taking with it) - recommend OP f/u PCP within 1 week for re-evaluation   3. Dyslipidemia - continued on home statin and Zetia   4. Diabetes mellitus - resume regular regimen at discharge (not on Metformin PTA)   5. Hypokalemia - she was on KCl at home on context of HCTZ. - repleted with 54mq KCl - since we are holding HCTZ, will hold KCl but would suggest recheck as outpatient  6. H/o constipation  - continued on home regimen   She is feeling better this afternoon and is pain free. She was observed post-procedurally and had no hemodynamic complications. Dr. MAngelena Formhas seen and examined the patient today and feels she is stable for discharge. Instructions outlined below. F/u arranged with our office 8/26. Otherwise instructed to f/u with primary care within a week.  Did the patient have an acute coronary syndrome (MI, NSTEMI, STEMI, etc) this admission?:  No                               Did the patient have a percutaneous coronary intervention (stent / angioplasty)?:  No.   _____________  Discharge Vitals  Blood pressure 113/72, pulse 62 temperature 97.8 F (36.6 C), temperature  source Oral, resp. rate 16, height '5\' 2"'  (1.575 m), weight 70 kg, SpO2 100 %.  Filed Weights   01/05/20 2003 01/06/20 0127  Weight: 66.9 kg 70 kg    Labs & Radiologic Studies    CBC Recent Labs    01/05/20 1418 01/06/20 0319  WBC 6.2 7.3  HGB 14.3 13.9  HCT 44.1 41.8  MCV 93.2 91.5  PLT 248 2341  Basic Metabolic Panel Recent Labs    01/05/20 1418 01/06/20 0319  NA 137 137  K 3.5 3.1*  CL 99 99  CO2 28 28  GLUCOSE 92 126*  BUN 13 14  CREATININE 0.87 0.77  CALCIUM 9.9 9.7   High Sensitivity Troponin:   Recent Labs  Lab 01/05/20 1418 01/05/20 1728  TROPONINIHS 3 4    _____________  DG Chest 2 View  Result Date: 01/05/2020 CLINICAL DATA:  Diffuse chest pain for 1 day, chest heaviness, shortness of breath EXAM: CHEST - 2 VIEW COMPARISON:  05/26/2018 FINDINGS: The heart size and mediastinal contours are within normal limits. Both lungs are clear. The visualized skeletal structures are unremarkable. IMPRESSION: No active cardiopulmonary disease. Electronically Signed   By: MRanda NgoM.D.   On: 01/05/2020 14:57   CARDIAC  CATHETERIZATION  Result Date: 01/06/2020  Calcified left main, LAD, and proximal circumflex.  Widely patent left main  Widely patent LAD  Large branching ramus intermedius with ostial 75% narrowing in the more anterior subbranch, unchanged from 2019  Tortuous circumflex with ostial 65 to 75% stenosis, unchanged from prior.  The ostial vessel was calcified.  Anterior origin right coronary.  Widely patent without obstructive disease.  Hyperdynamic left ventricular systolic function with EF greater than 65%.  EDP is normal. RECOMMENDATIONS:  Persisting substernal chest pressure throughout the procedure with anatomy not demonstrating disease that could account for pain at rest.  Images are similar to 2019.  She does have what would be a difficult to treat branch of the ramus intermedius and also ostial circumflex.  These regions are unchanged compared  to 2019.  Aggressive risk factor modification.  Consider alternative explanations for continuous chest discomfort in absence of EKG findings and enzyme elevation.  ECHOCARDIOGRAM COMPLETE  Result Date: 01/06/2020    ECHOCARDIOGRAM REPORT   Patient Name:   [name] Date of Exam: 01/06/2020 Medical Rec #:  [id number]             Height:       62.0 in Accession #:    [id number]            Weight:       154.3 lb Date of Birth:  Feb 28, 1941             BSA:          1.712 m Patient Age:    62 years              BP:           124/101 mmHg Patient Gender: F                     HR:           54 bpm. Exam Location:  Inpatient Procedure: 2D Echo, Cardiac Doppler and Color Doppler Indications:    Acute coronary syndrome I24.9  History:        Patient has prior history of Echocardiogram examinations, most                 recent 04/30/2009. CAD, Signs/Symptoms:Chest Pain and Dyspnea;                 Risk Factors:Hypertension, Dyslipidemia and Diabetes. Seizure.  Sonographer:    Roseanna Rainbow RDCS Referring Phys: [id number] JACOB M MATHEW  Sonographer Comments: Technically difficult study due to poor echo windows. Image acquisition challenging due to respiratory motion. Patient was very sensitive to pressure from the probe. IMPRESSIONS  1. Left ventricular ejection fraction, by estimation, is 60 to 65%. The left ventricle has normal function. The left ventricle has no regional wall motion abnormalities. There is mild left ventricular hypertrophy. Left ventricular diastolic parameters are consistent with Grade I diastolic dysfunction (impaired relaxation).  2. Right ventricular systolic function is normal. The right ventricular size is normal. Tricuspid regurgitation signal is inadequate for assessing PA pressure.  3. The mitral valve is normal in structure. Trivial mitral valve regurgitation. No evidence of mitral stenosis.  4. The aortic valve is tricuspid. Aortic valve regurgitation is not visualized. Mild aortic valve  sclerosis is present, with no evidence of aortic valve stenosis.  5. The inferior vena cava is normal in size with greater than 50% respiratory variability, suggesting right atrial pressure of 3 mmHg. FINDINGS  Left Ventricle:  Left ventricular ejection fraction, by estimation, is 60 to 65%. The left ventricle has normal function. The left ventricle has no regional wall motion abnormalities. The left ventricular internal cavity size was normal in size. There is  mild left ventricular hypertrophy. Left ventricular diastolic parameters are consistent with Grade I diastolic dysfunction (impaired relaxation). Right Ventricle: The right ventricular size is normal. Right ventricular systolic function is normal. Tricuspid regurgitation signal is inadequate for assessing PA pressure. The tricuspid regurgitant velocity is 2.03 m/s, and with an assumed right atrial  pressure of 3 mmHg, the estimated right ventricular systolic pressure is 59.1 mmHg. Left Atrium: Left atrial size was normal in size. Right Atrium: Right atrial size was normal in size. Pericardium: There is no evidence of pericardial effusion. Mitral Valve: The mitral valve is normal in structure. Normal mobility of the mitral valve leaflets. Trivial mitral valve regurgitation. No evidence of mitral valve stenosis. Tricuspid Valve: The tricuspid valve is normal in structure. Tricuspid valve regurgitation is trivial. No evidence of tricuspid stenosis. Aortic Valve: The aortic valve is tricuspid. Aortic valve regurgitation is not visualized. Mild aortic valve sclerosis is present, with no evidence of aortic valve stenosis. Pulmonic Valve: The pulmonic valve was normal in structure. Pulmonic valve regurgitation is not visualized. No evidence of pulmonic stenosis. Aorta: The aortic root is normal in size and structure. Venous: The inferior vena cava is normal in size with greater than 50% respiratory variability, suggesting right atrial pressure of 3 mmHg. IAS/Shunts:  No atrial level shunt detected by color flow Doppler.  LEFT VENTRICLE PLAX 2D LVIDd:         2.90 cm     Diastology LVIDs:         1.60 cm     LV e' lateral:   8.05 cm/s LV PW:         1.10 cm     LV E/e' lateral: 4.7 LV IVS:        1.20 cm     LV e' medial:    6.09 cm/s LVOT diam:     1.80 cm     LV E/e' medial:  6.3 LV SV:         41 LV SV Index:   24 LVOT Area:     2.54 cm  LV Volumes (MOD) LV vol d, MOD A2C: 45.1 ml LV vol d, MOD A4C: 37.0 ml LV vol s, MOD A2C: 12.4 ml LV vol s, MOD A4C: 13.8 ml LV SV MOD A2C:     32.7 ml LV SV MOD A4C:     37.0 ml LV SV MOD BP:      29.3 ml RIGHT VENTRICLE             IVC RV S prime:     11.50 cm/s  IVC diam: 1.60 cm TAPSE (M-mode): 2.2 cm LEFT ATRIUM             Index       RIGHT ATRIUM           Index LA diam:        2.90 cm 1.69 cm/m  RA Area:     10.10 cm LA Vol (A2C):   32.6 ml 19.04 ml/m RA Volume:   20.70 ml  12.09 ml/m LA Vol (A4C):   23.1 ml 13.49 ml/m LA Biplane Vol: 29.4 ml 17.17 ml/m  AORTIC VALVE LVOT Vmax:   73.70 cm/s LVOT Vmean:  45.100 cm/s LVOT VTI:    0.160 m  AORTA Ao Root diam: 2.50 cm MITRAL VALVE               TRICUSPID VALVE MV Area (PHT): 2.42 cm    TR Peak grad:   16.5 mmHg MV Decel Time: 313 msec    TR Vmax:        203.00 cm/s MV E velocity: 38.24 cm/s MV A velocity: 76.60 cm/s  SHUNTS MV E/A ratio:  0.50        Systemic VTI:  0.16 m                            Systemic Diam: 1.80 cm Kirk Ruths MD Electronically signed by Kirk Ruths MD Signature Date/Time: 01/06/2020/1:39:40 PM    Final    Disposition   Pt is being discharged home today in good condition.  Follow-up Plans & Appointments     Follow-up Information     Isaiah Serge, NP Follow up.   Specialties: Cardiology, Radiology Why: [facility] - an appointment has been made for you on Thursday February 20, 2020 at 10:15 AM (Arrive by 10:00 AM). Mickel Baas is one of the nurse practitioners that works closely with Dr. Tamala Julian. Contact information: [address] [place] [address] [phone]                Discharge Instructions     Diet - low sodium heart healthy   Complete by: As directed    Discharge instructions   Complete by: As directed    Your blood pressure was a little low in the hospital at times. Because of this, we have stopped your hydrochlorothiazide. This medicine is often paired with a potassium supplement. Since we are stopping your hydrochlorothiazide, we have also stopped your potassium supplement. Please follow up with your primary care provider within a week to have this rechecked. Please increase dietary intake of healthy sources of potassium including bananas, squash, yogurt, white beans, sweet potatoes, leafy greens, and avocados.  Please take your omeprazole (Prilosec - acid reducer) every day instead of just as-needed.  Your Miralax (polyethylene glycol) was taken off your med list since you indicated you were not taking this any longer.   Increase activity slowly   Complete by: As directed    No driving for 2 days. No lifting over 5 lbs for 1 week. No sexual activity for 1 week. Keep procedure site clean & dry. If you notice increased pain, swelling, bleeding or pus, call/return!  You may shower, but no soaking baths/hot tubs/pools for 1 week.       Discharge Medications   Allergies as of 01/06/2020       Reactions   Cymbalta [duloxetine Hcl] Hives, Itching   Effexor [venlafaxine] Nausea Only   Other Other (See Comments)   "SEEDED" food due to stomach issues   Statins Nausea And Vomiting   Penicillins Hives, Rash   Has patient had a PCN reaction causing immediate rash, facial/tongue/throat swelling, SOB or lightheadedness with hypotension: Yes Has patient had a PCN reaction causing severe rash involving mucus membranes or skin necrosis: No Has patient had a PCN reaction that required hospitalization: No Has patient had a PCN reaction occurring within the last 10 years: Yes If all of the above  answers are "NO", then may proceed with Cephalosporin use.        Medication List     STOP taking these medications    hydrochlorothiazide  25 MG tablet Commonly known as: HYDRODIURIL   polyethylene glycol 17 g packet Commonly known as: MIRALAX / GLYCOLAX   potassium chloride SA 20 MEQ tablet Commonly known as: Klor-Con M20       TAKE these medications    acetaminophen 500 MG tablet Commonly known as: TYLENOL Take 500 mg by mouth every 6 (six) hours as needed for headache (pain).   aspirin EC 81 MG tablet Take 1 tablet (81 mg total) by mouth daily.   azelastine 0.1 % nasal spray Commonly known as: ASTELIN Place 2 sprays into both nostrils 2 (two) times daily as needed for rhinitis. Use in each nostril as directed   BEANO PO Take 1-2 tablets by mouth daily as needed (for gas).   blood glucose meter kit and supplies Kit Dispense based on patient and insurance preference. Use up to four times daily as directed. E11.9.   blood glucose meter kit and supplies Kit Dispense based on patient and insurance preference. Use up to four times daily as directed. (FOR ICD-10: E11.9).   clonazePAM 0.5 MG tablet Commonly known as: KLONOPIN TAKE 1/2 TO 1 TABLET TWICE A DAY AS NEEDED FOR ANXIETY What changed:  how much to take how to take this when to take this reasons to take this additional instructions   ezetimibe 10 MG tablet Commonly known as: ZETIA TAKE 1 TABLET BY MOUTH EVERY DAY What changed: when to take this   Jardiance 10 MG Tabs tablet Generic drug: empagliflozin TAKE 1 TABLET BY MOUTH EVERY DAY What changed: how much to take   Lancets Misc Use to check blood sugars up to 4 times daily as directed. E11.9   linaclotide 145 MCG Caps capsule Commonly known as: Linzess Take 1 capsule (145 mcg total) by mouth daily as needed (constipation).   lubiprostone 24 MCG capsule Commonly known as: [name] Take 24 mcg by mouth 2 (two) times daily with a meal.    meclizine 12.5 MG tablet Commonly known as: ANTIVERT TAKE 1 TABLET (12.5 MG TOTAL) BY MOUTH 3 (THREE) TIMES DAILY AS NEEDED FOR DIZZINESS.   multivitamin with minerals Tabs tablet Take 1 tablet by mouth daily after lunch.   nitroGLYCERIN 0.4 MG SL tablet Commonly known as: NITROSTAT Place 1 tablet (0.4 mg total) under the tongue every 5 (five) minutes as needed for chest pain.   omeprazole 20 MG capsule Commonly known as: PRILOSEC TAKE 1 CAPSULE BY MOUTH EVERY DAY What changed:  how much to take when to take this reasons to take this   OneTouch Ultra test strip Generic drug: glucose blood USE UP TO 4 TIMES DAILY AS DIRECTED   OVER THE COUNTER MEDICATION Place 1 drop into both eyes 2 (two) times daily as needed (dry eyes). Over the counter eye drop for itching   OXcarbazepine 150 MG tablet Commonly known as: TRILEPTAL TAKE 1 TABLET BY MOUTH TWICE A DAY   Ozempic (0.25 or 0.5 MG/DOSE) 2 MG/1.5ML Sopn Generic drug: Semaglutide(0.25 or 0.5MG/DOS) Inject 0.5 mg into the skin once a week. What changed: when to take this   pregabalin 75 MG capsule Commonly known as: LYRICA TAKE ONE CAPSULE BY MOUTH THREE TIMES DAILY   ProAir HFA 108 (90 Base) MCG/ACT inhaler Generic drug: albuterol Inhale 2 puffs into the lungs every 6 (six) hours as needed for wheezing or shortness of breath.   PROBIOTIC PO Take 1 capsule by mouth daily after breakfast.   REFRESH OP Place 1 drop into both eyes daily as needed (  dry eyes).   rosuvastatin 5 MG tablet Commonly known as: CRESTOR Take 1 tablet (5 mg total) by mouth daily.   sennosides-docusate sodium 8.6-50 MG tablet Commonly known as: SENOKOT-S Take 1-2 tablets by mouth daily as needed for constipation.           Outstanding Labs/Studies   N/A  Duration of Discharge Encounter   Greater than 30 minutes including physician time.  Signed, Charlie Pitter, PA-C 01/06/2020, 3:04 PM  See my full note from this am. Pt with stable  CAD by cath today. Chest pain is not felt to be cardiac related. Continue PPI. D/c home today  Lauree Chandler 01/06/2020 5:29 PM

## 2020-01-06 NOTE — Consult Note (Signed)
   [facility] Inpatient Consult   01/06/2020  Kamyla Olejnik 1941-06-07 [id number]   Patient is currently active with [facility] for chronic disease management services.  Patient has been engaged by a Midwife for [facility]TA CSNP for Diabetes Management program.  Our community based plan of care has focused on disease management and community resource support.   Patient will receive a post hospital call and will be evaluated for assessments and disease process education.    Plan: Will follow up Inpatient Transition Of Care [TOC] team member to make aware that [facility] following. Of note, [facility] Care Management services does not replace or interfere with any services that are needed or arranged by inpatient [facility] care management team.  For additional questions or referrals please contact:  Natividad Brood, RN BSN [facility] Liaison  [phone] business mobile phone Toll free office [phone]  Fax number: [phone] [email or web address]

## 2020-01-06 NOTE — Interval H&P Note (Signed)
Cath Lab Visit (complete for each Cath Lab visit)  Clinical Evaluation Leading to the Procedure:   ACS: Yes.    Non-ACS:    Anginal Classification: CCS III  Anti-ischemic medical therapy: Minimal Therapy (1 class of medications)  Non-Invasive Test Results: No non-invasive testing performed  Prior CABG: No previous CABG      History and Physical Interval Note:  01/06/2020 9:31 AM  [name]  has presented today for surgery, with the diagnosis of unstable angina.  The various methods of treatment have been discussed with the patient and family. After consideration of risks, benefits and other options for treatment, the patient has consented to  Procedure(s): LEFT HEART CATH AND CORONARY ANGIOGRAPHY (N/A) as a surgical intervention.  The patient's history has been reviewed, patient examined, no change in status, stable for surgery.  I have reviewed the patient's chart and labs.  Questions were answered to the patient's satisfaction.     Belva Crome III

## 2020-01-07 ENCOUNTER — Telehealth: Payer: Self-pay | Admitting: *Deleted

## 2020-01-07 NOTE — Telephone Encounter (Signed)
[name] was on TCM report admitted 01/05/20 for further evaluation for stuttering CP. EKG was nonacute and initial troponin was negative. 2D echo today showed EF 60-65%, mild LVH, grade 1 DD, no significant valvular disease. [name] underwent cardiac catheterization showing similar findings to 2019 with patent LM and LAD, large branching ramus with 75% ostial narrowing, 65-75% tortuous Cx, parent RCA, LVEF >65% and normal. [name] D/C 01/06/20, and will follow-up w/cardiology.Marland KitchenJohny Chess

## 2020-01-08 ENCOUNTER — Other Ambulatory Visit: Payer: Self-pay

## 2020-01-08 NOTE — Patient Outreach (Signed)
°  [facility]) Care Management Chronic Special Needs Program  01/08/2020  Name: [name] DOB: [birth year]  MRN: [id number]  Ms. [name] is enrolled in a chronic special needs plan for Diabetes. Reviewed and updated care plan. Client admitted for observation for chest pain on 01/05/20 and discharged home on 01/06/20   Goals Addressed            This Visit's Progress    Client will report no worsening of symptoms related to heart disease within the next 6 months   Worsening    Observation admission on 01/05/20 for chest pain Notify provider for symptoms of chest pain, sweating, nausea/vomiting, irregular heartbeat, palpitations, rapid heart rate, shortness of breath or dizziness or fainting. Call 911 for severe symptoms of chest pain or shortness of breath. Take medications as prescribed Sent EMMI: Angina     General - Client will not be readmitted within 30 days (C-SNP)       Transition of care call completed by EMMI general discharge Please follow discharge instructions and call provider if you have any questions. Please attend all follow up appointments as scheduled. Please take your medications as prescribed. Please call 24 Hour nurse advice line as needed [phone]).        Plan:  Send  outreach letter with a copy of their individualized care plan, Send individual care plan to provider and Send educational material-EMMI: Angina  Chronic care management coordinator will outreach in 1-2 months or sooner as needed     Peter Garter RN, Jackquline Denmark, [place] [facility] Coordinator [facility] [phone]

## 2020-01-09 ENCOUNTER — Other Ambulatory Visit: Payer: Self-pay | Admitting: Internal Medicine

## 2020-01-09 ENCOUNTER — Telehealth: Payer: Self-pay

## 2020-01-09 ENCOUNTER — Ambulatory Visit: Payer: Self-pay | Admitting: Pharmacist

## 2020-01-09 DIAGNOSIS — E782 Mixed hyperlipidemia: Secondary | ICD-10-CM

## 2020-01-09 DIAGNOSIS — I10 Essential (primary) hypertension: Secondary | ICD-10-CM

## 2020-01-09 NOTE — Telephone Encounter (Signed)
1.Medication Requested:pregabalin (LYRICA) 75 MG capsule  2. Pharmacy (Name, Street, City):[facility] - [place], [place] - [address]  3. On Med List: Yes   4. Last Visit with PCP: 2.15.21   5. Next visit date with PCP: 7.29.21    Agent: Please be advised that RX refills may take up to 3 business days. We ask that you follow-up with your pharmacy.

## 2020-01-09 NOTE — Chronic Care Management (AMB) (Signed)
  Chronic Care Management   Outreach Note  01/09/2020 Name: [name] MRN: [id number] DOB: 1940/12/28  Referred by: Binnie Rail, MD Reason for referral : Chronic Care Management (Pill pack delivery) and Medication Management   Reviewed chart for medication changes ahead of medication coordination call.  01/05/20 - 01/06/20 Hospital admission: chest pain, LHC 7/12, rec'd aggressive risk factor modification. CP likely musculoskeletal. Scheduled PPI. Hold HCTZ (and KCl) at discharge due to soft BP inpatient.  BP Readings from Last 3 Encounters:  01/06/20 (!) 109/55  08/12/19 124/74  02/06/19 (!) 128/58    Lab Results  Component Value Date   HGBA1C 6.9 (H) 08/12/2019     Medication management via [facility]: Patient obtains medications through Adherence Packaging  90 Days   Patient is due for next adherence delivery on: 01/13/20 Called patient and reviewed medications and coordinated delivery.  This delivery to include: Pregabalin 75mg  one tablet 3 times daily-Breakfast, evening, Bedtime  Rosuvastatin 5mg  daily -Evening  Oxcarbazpin 150mg  twice daily- Breakfast, Dinner  Omeprazole 20mg  daily- Breakfast   Patient declined the following medications: -HCTZ (stopped in hospital, hold at discharge) -potassium (stopped in hospital, hold at discharge)  Patient needs refills for pregabalin - requested from PCP.  Confirmed delivery date of 01/13/20, advised patient that pharmacy will contact them the morning of delivery.   Charlene Brooke, PharmD

## 2020-01-10 ENCOUNTER — Other Ambulatory Visit: Payer: Self-pay | Admitting: Family

## 2020-01-10 MED ORDER — PREGABALIN 75 MG PO CAPS: 75.0000 mg | ORAL_CAPSULE | Freq: Three times a day (TID) | ORAL | 0 refills | Status: DC

## 2020-01-21 ENCOUNTER — Other Ambulatory Visit: Payer: Self-pay

## 2020-01-21 MED ORDER — EZETIMIBE 10 MG PO TABS: 10.0000 mg | ORAL_TABLET | Freq: Every day | ORAL | 1 refills | Status: DC

## 2020-01-22 ENCOUNTER — Other Ambulatory Visit: Payer: Self-pay

## 2020-01-22 NOTE — Progress Notes (Signed)
Subjective:    Patient ID: [name], female    DOB: 1941/04/25, 79 y.o.   MRN: [id number]  HPI The patient is here for follow up from the hospital.  Admitted 7/11-7/12 for chest pain  She was feeling chest pain/pressure and was more tired with exertion.  The chest pain occurred randomly and was sometimes sharp, dull.  Taking her neuropathy meds seemed to take.   She does have CAD, but it did not explain her pain.  Her Cath was unchanged from 2019.  Cardiology recommended agressive risk factor modification, but to consider other causes for her chest pain.   Chest pain likely msk.  She was started on a PPI.    Htn:  hctz was held due to low bp.  It was held at d/c along with Kcl.      She is still having a little chest pain - it is mild and in her left chest. It has improved.  Moving around makes the pain worse.     Dysuria - had some this morning and intermittent since before the hosptial.  She states some increase in urinary frequency and she does have some tenderness in her lower abdomen.  She denies any hematuria or fever.  She is taking her other medications as prescribed.  Medications and allergies reviewed with patient and updated if appropriate.  Patient Active Problem List   Diagnosis Date Noted  . Hypokalemia 01/06/2020  . Unstable angina ([place]) 01/05/2020  . CAD (coronary artery disease) 05/28/2018  . Abnormal CT scan, heart   . Neck pain 07/21/2017  . Decreased hearing of right ear 02/01/2017  . Primary osteoarthritis of left knee 01/12/2017  . Constipation 01/10/2017  . Hypercalcemia 10/12/2016  . Chest pain 10/12/2016  . DOE (dyspnea on exertion) 10/12/2016  . Chronic back pain 08/23/2016  . Hair loss 08/23/2016  . Diabetes ([place]) 07/26/2016  . Anxiety 09/14/2015  . Diverticulitis large intestine w/o perforation or abscess w/o bleeding 05/12/2014  . Essential hypertension 05/12/2014  . Diabetic neuropathy ([place]) 05/12/2014  . Seizure disorder  ([place]) 05/12/2014  . Hepatic steatosis 05/12/2014  . Cerebrovascular disease, unspecified 03/13/2013  . Allergic rhinitis 03/13/2013  . GOITER, MULTINODULAR 05/18/2009  . VERTIGO 04/13/2009  . Irritable bowel syndrome 03/20/2009  . Dyslipidemia 03/18/2009  . GERD 03/18/2009    Current Outpatient Medications on File Prior to Visit  Medication Sig Dispense Refill  . acetaminophen (TYLENOL) 500 MG tablet Take 500 mg by mouth every 6 (six) hours as needed for headache (pain).    Marland Kitchen albuterol (PROAIR HFA) 108 (90 BASE) MCG/ACT inhaler Inhale 2 puffs into the lungs every 6 (six) hours as needed for wheezing or shortness of breath.     . Alpha-D-Galactosidase (BEANO PO) Take 1-2 tablets by mouth daily as needed (for gas).    Marland Kitchen aspirin EC 81 MG tablet Take 1 tablet (81 mg total) by mouth daily. 90 tablet 3  . azelastine (ASTELIN) 0.1 % nasal spray Place 2 sprays into both nostrils 2 (two) times daily as needed for rhinitis. Use in each nostril as directed    . blood glucose meter kit and supplies KIT Dispense based on patient and insurance preference. Use up to four times daily as directed. E11.9. 1 each 0  . blood glucose meter kit and supplies KIT Dispense based on patient and insurance preference. Use up to four times daily as directed. (FOR ICD-10: E11.9). 1 each 0  . clonazePAM (KLONOPIN) 0.5 MG tablet  TAKE 1/2 TO 1 TABLET TWICE A DAY AS NEEDED FOR ANXIETY (Patient taking differently: Take 0.5 mg by mouth 2 (two) times daily as needed for anxiety. ) 60 tablet 0  . ezetimibe (ZETIA) 10 MG tablet Take 1 tablet (10 mg total) by mouth daily. 90 tablet 1  . glucose blood (ONETOUCH ULTRA) test strip USE UP TO 4 TIMES DAILY AS DIRECTED 400 strip 3  . JARDIANCE 10 MG TABS tablet TAKE 1 TABLET BY MOUTH EVERY DAY 30 tablet 1  . Lancets MISC Use to check blood sugars up to 4 times daily as directed. E11.9 400 each 3  . linaclotide (LINZESS) 145 MCG CAPS capsule Take 1 capsule (145 mcg total) by mouth daily  as needed (constipation). 30 capsule 5  . lubiprostone ([name]) 24 MCG capsule Take 24 mcg by mouth 2 (two) times daily with a meal.     . meclizine (ANTIVERT) 12.5 MG tablet TAKE 1 TABLET (12.5 MG TOTAL) BY MOUTH 3 (THREE) TIMES DAILY AS NEEDED FOR DIZZINESS. 30 tablet 0  . Multiple Vitamin (MULTIVITAMIN WITH MINERALS) TABS tablet Take 1 tablet by mouth daily after lunch.    . nitroGLYCERIN (NITROSTAT) 0.4 MG SL tablet Place 1 tablet (0.4 mg total) under the tongue every 5 (five) minutes as needed for chest pain. 25 tablet 2  . omeprazole (PRILOSEC) 20 MG capsule TAKE 1 CAPSULE BY MOUTH EVERY DAY (Patient taking differently: Take 20 mg by mouth daily as needed (acid reflux/heartburn). ) 90 capsule 1  . OVER THE COUNTER MEDICATION Place 1 drop into both eyes 2 (two) times daily as needed (dry eyes). Over the counter eye drop for itching    . OXcarbazepine (TRILEPTAL) 150 MG tablet TAKE 1 TABLET BY MOUTH TWICE A DAY (Patient taking differently: Take 150 mg by mouth 2 (two) times daily. ) 180 tablet 0  . Polyvinyl Alcohol-Povidone (REFRESH OP) Place 1 drop into both eyes daily as needed (dry eyes).    . pregabalin (LYRICA) 75 MG capsule Take 1 capsule (75 mg total) by mouth 3 (three) times daily. 90 capsule 0  . Probiotic Product (PROBIOTIC PO) Take 1 capsule by mouth daily after breakfast.     . rosuvastatin (CRESTOR) 5 MG tablet Take 1 tablet (5 mg total) by mouth daily. 90 tablet 1  . Semaglutide,0.25 or 0.5MG/DOS, (OZEMPIC, 0.25 OR 0.5 MG/DOSE,) 2 MG/1.5ML SOPN Inject 0.5 mg into the skin once a week. (Patient taking differently: Inject 0.5 mg into the skin every Saturday. ) 1 pen 5  . sennosides-docusate sodium (SENOKOT-S) 8.6-50 MG tablet Take 1-2 tablets by mouth daily as needed for constipation.    . [DISCONTINUED] Calcium Carbonate (CALCIUM 500 PO) Take 1 capsule by mouth every other day.      No current facility-administered medications on file prior to visit.    Past Medical History:    Diagnosis Date  . Allergic rhinitis, cause unspecified 03/13/2013  . Anxiety   . Cerebrovascular disease, unspecified 03/13/2013   Atrophy and small vessel dz noted, MR brain 2009  . Depression   . Diabetes mellitus, type 2 ([place])   . Diverticulosis of colon 03/2010 hosp  . Dyslipidemia   . GERD (gastroesophageal reflux disease)   . Hypertension   . Neuropathy   . OSA on CPAP   . Osteoarthritis of shoulder region    and Knee  . Seizure disorder ([place])    onset 11 years ago; repeated 2013    Past Surgical History:  Procedure Laterality Date  .  ABDOMINAL HYSTERECTOMY  1970's   Partial  . APPENDECTOMY    . CHOLECYSTECTOMY    . LEFT HEART CATH AND CORONARY ANGIOGRAPHY N/A 05/28/2018   Procedure: LEFT HEART CATH AND CORONARY ANGIOGRAPHY;  Surgeon: Belva Crome, MD;  Location: [facility];  Service: Cardiovascular;  Laterality: N/A;  . LEFT HEART CATH AND CORONARY ANGIOGRAPHY N/A 01/06/2020   Procedure: LEFT HEART CATH AND CORONARY ANGIOGRAPHY;  Surgeon: Belva Crome, MD;  Location: [facility];  Service: Cardiovascular;  Laterality: N/A;  . LUMBAR EPIDURAL INJECTION Left 08/25/2017  . SHOULDER SURGERY  2008   LT, post fall   . TONSILLECTOMY AND ADENOIDECTOMY    . TOTAL KNEE ARTHROPLASTY Left 01/12/2017   Procedure: LEFT TOTAL KNEE ARTHROPLASTY;  Surgeon: Susa Day, MD;  Location: [facility];  Service: Orthopedics;  Laterality: Left;  120 mins    Social History   Socioeconomic History  . Marital status: Widowed    Spouse name: Not on file  . Number of children: 2  . Years of education: Not on file  . Highest education level: Not on file  Occupational History  . Occupation: retired  Tobacco Use  . Smoking status: Former Smoker    Quit date: 10/19/1985    Years since quitting: 34.2  . Smokeless tobacco: Never Used  Vaping Use  . Vaping Use: Never used  Substance and Sexual Activity  . Alcohol use: Yes    Alcohol/week: 0.0 standard drinks    Comment:  occasionally   . Drug use: No  . Sexual activity: Never  Other Topics Concern  . Not on file  Social History Narrative   Patient lives in a one story home.  Has 2 children.  Retired from [employer].   Social Determinants of Health   Financial Resource Strain: Medium Risk  . Difficulty of Paying Living Expenses: Somewhat hard  Food Insecurity: No Food Insecurity  . Worried About Charity fundraiser in the Last Year: Never true  . Ran Out of Food in the Last Year: Never true  Transportation Needs: No Transportation Needs  . Lack of Transportation (Medical): No  . Lack of Transportation (Non-Medical): No  Physical Activity:   . Days of Exercise per Week:   . Minutes of Exercise per Session:   Stress:   . Feeling of Stress :   Social Connections:   . Frequency of Communication with Friends and Family:   . Frequency of Social Gatherings with Friends and Family:   . Attends Religious Services:   . Active Member of Clubs or Organizations:   . Attends Archivist Meetings:   Marland Kitchen Marital Status:     Family History  Problem Relation Age of Onset  . Arthritis Mother   . Heart disease Father   . Arthritis Other        Grandmother  . Diabetes Other        Grandmother    Review of Systems  Constitutional: Negative for fever.  Respiratory: Positive for cough (sometimes), shortness of breath and wheezing (occ).   Cardiovascular: Positive for chest pain (left sided). Negative for palpitations and leg swelling.  Gastrointestinal: Negative for abdominal pain and nausea.  Genitourinary: Positive for dysuria and frequency. Negative for hematuria.  Neurological: Positive for dizziness, light-headedness and headaches.       Objective:   Vitals:   01/23/20 1006  BP: 110/68  Pulse: 58  Temp: 98.2 F (36.8 C)  SpO2: 98%  BP Readings from Last 3 Encounters:  01/23/20 110/68  01/06/20 (!) 109/55  08/12/19 124/74   Wt Readings from Last 3 Encounters:  01/23/20 162 lb (73.5  kg)  01/06/20 154 lb 4.8 oz (70 kg)  08/12/19 151 lb (68.5 kg)   Body mass index is 29.63 kg/m.   Physical Exam    Constitutional: Appears well-developed and well-nourished. No distress.  HENT:  Head: Normocephalic and atraumatic.  Neck: Neck supple. No tracheal deviation present. No thyromegaly present.  No cervical lymphadenopathy Cardiovascular: Normal rate, regular rhythm and normal heart sounds.   No murmur heard. No carotid bruit .  No edema Pulmonary/Chest: Left chest wall nontender to palpation, but there is some increase in pain with certain movement.  Effort normal and breath sounds normal. No respiratory distress. No has no wheezes. No rales.  Abd:  Soft, mild tenderness in lower central abd w/o rebound or guarding. Skin: Skin is warm and dry. Not diaphoretic.  Psychiatric: Normal mood and affect. Behavior is normal.      Assessment & Plan:    See Problem List for Assessment and Plan of chronic medical problems.    This visit occurred during the SARS-CoV-2 public health emergency.  Safety protocols were in place, including screening questions prior to the visit, additional usage of staff PPE, and extensive cleaning of exam room while observing appropriate contact time as indicated for disinfecting solutions.

## 2020-01-22 NOTE — Patient Outreach (Signed)
  [facility]) Care Management Chronic Special Needs Program    01/22/2020  Name: [name], DOB: 1941-01-07  MRN: [id number]   Ms. [name] is enrolled in a chronic special needs plan for Diabetes. Client called RNCM as her transportation to her appointment with Dr. Quay Burow tomorrow morning for her post hospital follow up is not available.  States she has no other family members that can take her to this appointment. RNCM called [facility] [employer] services to arrange transportation for client.   Called client back to notify her that [facility] [employer] will call her to complete their registration  for transportation services.  Voiced understanding Plan to follow up as scheduled Peter Garter RN, [facility], [place] Management [phone]

## 2020-01-23 ENCOUNTER — Other Ambulatory Visit: Payer: Self-pay

## 2020-01-23 ENCOUNTER — Ambulatory Visit (INDEPENDENT_AMBULATORY_CARE_PROVIDER_SITE_OTHER): Payer: HMO | Admitting: Internal Medicine

## 2020-01-23 ENCOUNTER — Encounter: Payer: Self-pay | Admitting: Internal Medicine

## 2020-01-23 VITALS — BP 110/68 | HR 58 | Temp 98.2°F | Ht 62.0 in | Wt 162.0 lb

## 2020-01-23 DIAGNOSIS — R300 Dysuria: Secondary | ICD-10-CM | POA: Diagnosis not present

## 2020-01-23 DIAGNOSIS — E1142 Type 2 diabetes mellitus with diabetic polyneuropathy: Secondary | ICD-10-CM | POA: Diagnosis not present

## 2020-01-23 DIAGNOSIS — G8929 Other chronic pain: Secondary | ICD-10-CM

## 2020-01-23 DIAGNOSIS — R072 Precordial pain: Secondary | ICD-10-CM | POA: Diagnosis not present

## 2020-01-23 DIAGNOSIS — I10 Essential (primary) hypertension: Secondary | ICD-10-CM | POA: Diagnosis not present

## 2020-01-23 DIAGNOSIS — R195 Other fecal abnormalities: Secondary | ICD-10-CM | POA: Insufficient documentation

## 2020-01-23 DIAGNOSIS — M549 Dorsalgia, unspecified: Secondary | ICD-10-CM | POA: Diagnosis not present

## 2020-01-23 MED ORDER — KETOROLAC TROMETHAMINE 30 MG/ML IJ SOLN
30.0000 mg | Freq: Once | INTRAMUSCULAR | Status: AC
  Administered 2020-01-23: 30 mg via INTRAMUSCULAR

## 2020-01-23 NOTE — Assessment & Plan Note (Signed)
Chronic Taking Lyrica 3 times a day Continue Lyrica at current dose

## 2020-01-23 NOTE — Assessment & Plan Note (Addendum)
Chronic BP well controlled hctz and potassium stopped in hospital due to low BP We will continue to monitor off medication bmp

## 2020-01-23 NOTE — Assessment & Plan Note (Signed)
Acute Had some dysuria today and has had that intermittently associated with increased frequency and some suprapubic tenderness UA, urine culture to rule out infection

## 2020-01-23 NOTE — Assessment & Plan Note (Signed)
Recently in the hospital for this and it was deemed not to be cardiac in nature She still has some pain, but it is better-it is worse with movement and likely musculoskeletal in nature It is improving and will likely continue to improve-reassured her Toradol 30 mg IM x1

## 2020-01-23 NOTE — Patient Instructions (Addendum)
Your received an injection for pain today.   Blood work and urine tests were ordered.     Medications reviewed and updated.  Changes include :  none    A referral was ordered for  GI.      Someone from their office will call you to schedule an appointment.    Please followup in 3 months

## 2020-01-23 NOTE — Assessment & Plan Note (Addendum)
Chronic Check a1c She has not been compliant with a diabetic diet-stressed compliance Can consider increasing the ozempic Low sugar / carb diet Stressed regular exercise

## 2020-01-23 NOTE — Assessment & Plan Note (Signed)
Acute She states some changes in stool She is overdue for colonoscopy-should have had it 2 years ago Will refer to GI to see if they would like to pursue a colonoscopy at this time

## 2020-01-23 NOTE — Progress Notes (Signed)
Pt requested refill for ezetimibe, coordinated refill to be delivered 01/22/20.

## 2020-01-23 NOTE — Assessment & Plan Note (Signed)
Following with orthopedics and will see Dr. Maxie Better soon Having significant pain in multiple areas We will give Toradol injection 30 mg IM today

## 2020-01-24 LAB — URINALYSIS, ROUTINE W REFLEX MICROSCOPIC
Bacteria, UA: NONE SEEN /HPF
Bilirubin Urine: NEGATIVE
Hgb urine dipstick: NEGATIVE
Hyaline Cast: NONE SEEN /LPF
Ketones, ur: NEGATIVE
Nitrite: NEGATIVE
Protein, ur: NEGATIVE
RBC / HPF: NONE SEEN /HPF (ref 0–2)
Specific Gravity, Urine: 1.017 (ref 1.001–1.03)
Squamous Epithelial / HPF: NONE SEEN /HPF (ref <=5)
pH: 6 (ref 5.0–8.0)

## 2020-01-24 LAB — HEMOGLOBIN A1C
Hgb A1c MFr Bld: 6.3 % of total Hgb — ABNORMAL HIGH (ref <5.7)
Mean Plasma Glucose: 134 (calc)
eAG (mmol/L): 7.4 (calc)

## 2020-01-24 LAB — BASIC METABOLIC PANEL WITH GFR
BUN: 13 mg/dL (ref 7–25)
CO2: 29 mmol/L (ref 20–32)
Calcium: 10.3 mg/dL (ref 8.6–10.4)
Chloride: 100 mmol/L (ref 98–110)
Creat: 0.76 mg/dL (ref 0.60–0.93)
GFR, Est African American: 88 mL/min/{1.73_m2} (ref >=60)
GFR, Est Non African American: 76 mL/min/{1.73_m2} (ref >=60)
Glucose, Bld: 84 mg/dL (ref 65–99)
Potassium: 3.6 mmol/L (ref 3.5–5.3)
Sodium: 140 mmol/L (ref 135–146)

## 2020-01-24 LAB — URINE CULTURE

## 2020-01-24 LAB — VITAMIN B12: Vitamin B-12: 797 pg/mL (ref 200–1100)

## 2020-02-04 DIAGNOSIS — E119 Type 2 diabetes mellitus without complications: Secondary | ICD-10-CM | POA: Diagnosis not present

## 2020-02-04 DIAGNOSIS — G40909 Epilepsy, unspecified, not intractable, without status epilepticus: Secondary | ICD-10-CM | POA: Diagnosis not present

## 2020-02-04 DIAGNOSIS — I679 Cerebrovascular disease, unspecified: Secondary | ICD-10-CM | POA: Diagnosis not present

## 2020-02-04 DIAGNOSIS — M25511 Pain in right shoulder: Secondary | ICD-10-CM | POA: Diagnosis not present

## 2020-02-06 ENCOUNTER — Other Ambulatory Visit: Payer: Self-pay

## 2020-02-06 NOTE — Patient Outreach (Addendum)
[facility]) Care Management Chronic Special Needs Program  02/06/2020  Name: [name] DOB: [birth year]  MRN: [id number]  Ms. [name] is enrolled in a chronic special needs plan for Diabetes. Reviewed and updated care plan.  Subjective: Client states that she is doing better since she was in the hospital for chest pains.  States it was not her heart. States she still has an ache in her lt breast at times.   States she did see her orthopedic doctor on 02/04/20 and he gave her a shot in her back.  States she thinks it is helping some with her back pain.  Denies any shortness of breath.  States she did see her primary care doctor 01/23/20 and her Hemoglobin A1C was good.  States she is checking her blood sugars once a day.  States they are ranging from 80-106.  Denies any low readings.  States she tries to watch what she eats and she has been eating smaller portions. States she is trying to walk but is walking less due to the hot weather.  Denies any falls.  States her B/P has been good when it is checked.  States she would like to get SCAT as a back up if her family can not take her to appointments.  States she is affording her medications with assistance. States she had gotten both COVID shots  Goals Addressed              This Visit's Progress   .  COMPLETED: Client understands the importance of follow-up with providers by attending scheduled visits   On track     Keeping scheduled appointments with providers Reinforced to keep scheduled appointments with providers Goal completed 02/06/20    .  Client will have transportation needs addressed by next 6 months        Plan to refer to social work to provider resources to apply for SCAT    .  Client will report improved coping with chronic pain by next 6 months        Plan to continue to go to orthopedic doctor and contact if injection does not improve pain    .  Client will report no fall or injuries in the  next 6 months.(continue 02/06/20)   On track     Reports no falls Reinforced fall precautions and home safety Stand up slowly Use cane or walker at all times Use grabber to pick up objects on the floor Keep home well-lit and wear your glasses      .  Client will report no worsening of symptoms related to heart disease within the next 6 months   No change     Observation admission on 01/05/20 for chest pain Notify provider for symptoms of chest pain, sweating, nausea/vomiting, irregular heartbeat, palpitations, rapid heart rate, shortness of breath or dizziness or fainting. Call 911 for severe symptoms of chest pain or shortness of breath. Take medications as prescribed     .  COMPLETED: Client will use Assistive Devices as needed and verbalize understanding of device use   On track     Reports using [employer]) approved glucometer without issue Goal completed 02/06/20    .  Client will verbalize knowledge of self management of Hypertension as evidences by BP reading of 140/90 or less; or as defined by provider   On track     Reports B/P below 140/90 at provider visits Plan to check B/P regularly Take B/P  medications as ordered Plan to follow a low salt diet  Increase activity as tolerated    .  Diabetes Patient stated goal walk 15 minutes 3 times a week(continued 02/06/20) (pt-stated)        Plan to increase activity slowly    .  COMPLETED: General - Client will not be readmitted within 30 days (C-SNP)   On track     Discharged on 01/06/20 No readmission or ED visit 30 days post discharge Please call 24 Hour nurse advice line as needed [phone]). Goal completed 02/06/20     .  HEMOGLOBIN A1C < 7        Last Hemoglobin A1C 6.3% 01/23/20 Reinforced to follow a low carbohydrate, low sodium diet Check blood sugars daily either fasting or 1 1/2 hours after eating with goal of 80-130 fasting and 180 or less after meals and record in calendar log sheets    .   COMPLETED: Maintain timely refills of diabetic medication as prescribed within the year .   On track     Maintaining timely refills of medications per dispense report Reinforced importance of getting medications refilled on time Goal completed 01/23/20    .  Obtain Annual Eye (retinal)  Exam    On track     Last completed 11/01/18 Plan to have a dilated eye exam every year    .  Obtain Annual Foot Exam   On track     Last completed 09/11/18 Check your skin and feet every day for cuts, bruises, redness, blisters, or sores. Schedule a foot exam with your health care provider once every year    .  Obtain annual screen for micro albuminuria (urine) , nephropathy (kidney problems)   On track     Last completed 02/06/19 It is important for your doctor to check your urine for protein at least every year    .  COMPLETED: Obtain Hemoglobin A1C at least 2 times per year   On track     Last completed 08/12/19, 01/23/20 It is important to have your Hemoglobin A1C checked every 6 months if you are at goal and every 3 months if you are not at goal Goal completed 02/06/20    .  Visit Primary Care Provider or Endocrinologist at least 2 times per year    On track     Primary care provider completed 08/12/19, 01/23/20 Last Annual Wellness visit 11/05/18 Please schedule your annual wellness visit       Plan:  Send successful outreach letter with a copy of their individualized care plan and Send individual care plan to provider  Client will be outreached by a [employer] RNCM in 6 months per tier level  Will refer to:  Social Work   Peter Garter RN, [employer], [employer] [facility] [phone]

## 2020-02-07 ENCOUNTER — Telehealth: Payer: Self-pay | Admitting: Internal Medicine

## 2020-02-07 ENCOUNTER — Telehealth: Payer: Self-pay | Admitting: Pharmacist

## 2020-02-07 MED ORDER — PREGABALIN 75 MG PO CAPS: 75.0000 mg | ORAL_CAPSULE | Freq: Three times a day (TID) | ORAL | 0 refills | Status: DC

## 2020-02-07 NOTE — Telephone Encounter (Signed)
    [facility] calling to request refill on pregabalin (LYRICA) 75 MG capsule

## 2020-02-07 NOTE — Progress Notes (Addendum)
Chronic Care Management Pharmacy Assistant   Name: [name]  MRN: [id number] DOB: Feb 28, 1941  Reason for Encounter: Medication Review   Patient Questions:  1.  Have you seen any other providers since your last visit? Yes, The patient had a nurse visit on 02/06/2020.   2.  Any changes in your medicines or health? No   PCP : Binnie Rail, MD  Allergies:   Allergies  Allergen Reactions  . Cymbalta [Duloxetine Hcl] Hives and Itching  . Effexor [Venlafaxine] Nausea Only  . Other Other (See Comments)    "SEEDED" food due to stomach issues  . Statins Nausea And Vomiting  . Penicillins Hives and Rash    Has patient had a PCN reaction causing immediate rash, facial/tongue/throat swelling, SOB or lightheadedness with hypotension: Yes Has patient had a PCN reaction causing severe rash involving mucus membranes or skin necrosis: No Has patient had a PCN reaction that required hospitalization: No Has patient had a PCN reaction occurring within the last 10 years: Yes If all of the above answers are "NO", then may proceed with Cephalosporin use.     Medications: Outpatient Encounter Medications as of 02/07/2020  Medication Sig Note  . acetaminophen (TYLENOL) 500 MG tablet Take 500 mg by mouth every 6 (six) hours as needed for headache (pain).   Marland Kitchen albuterol (PROAIR HFA) 108 (90 BASE) MCG/ACT inhaler Inhale 2 puffs into the lungs every 6 (six) hours as needed for wheezing or shortness of breath.    . Alpha-D-Galactosidase (BEANO PO) Take 1-2 tablets by mouth daily as needed (for gas).   Marland Kitchen aspirin EC 81 MG tablet Take 1 tablet (81 mg total) by mouth daily.   Marland Kitchen azelastine (ASTELIN) 0.1 % nasal spray Place 2 sprays into both nostrils 2 (two) times daily as needed for rhinitis. Use in each nostril as directed   . blood glucose meter kit and supplies KIT Dispense based on patient and insurance preference. Use up to four times daily as directed. (FOR ICD-10: E11.9).   . clonazePAM  (KLONOPIN) 0.5 MG tablet TAKE 1/2 TO 1 TABLET TWICE A DAY AS NEEDED FOR ANXIETY (Patient taking differently: Take 0.5 mg by mouth 2 (two) times daily as needed for anxiety. )   . ezetimibe (ZETIA) 10 MG tablet Take 1 tablet (10 mg total) by mouth daily.   Marland Kitchen glucose blood (ONETOUCH ULTRA) test strip USE UP TO 4 TIMES DAILY AS DIRECTED   . JARDIANCE 10 MG TABS tablet TAKE 1 TABLET BY MOUTH EVERY DAY   . Lancets MISC Use to check blood sugars up to 4 times daily as directed. E11.9   . linaclotide (LINZESS) 145 MCG CAPS capsule Take 1 capsule (145 mcg total) by mouth daily as needed (constipation).   . meclizine (ANTIVERT) 12.5 MG tablet TAKE 1 TABLET (12.5 MG TOTAL) BY MOUTH 3 (THREE) TIMES DAILY AS NEEDED FOR DIZZINESS.   . Multiple Vitamin (MULTIVITAMIN WITH MINERALS) TABS tablet Take 1 tablet by mouth daily after lunch.   . nitroGLYCERIN (NITROSTAT) 0.4 MG SL tablet Place 1 tablet (0.4 mg total) under the tongue every 5 (five) minutes as needed for chest pain. 01/05/2020: 2 tablets on 7/2 and 2 tablets on 7/8  . omeprazole (PRILOSEC) 20 MG capsule TAKE 1 CAPSULE BY MOUTH EVERY DAY (Patient taking differently: Take 20 mg by mouth daily as needed (acid reflux/heartburn). )   . OVER THE COUNTER MEDICATION Place 1 drop into both eyes 2 (two) times daily as needed (  dry eyes). Over the counter eye drop for itching   . OXcarbazepine (TRILEPTAL) 150 MG tablet TAKE 1 TABLET BY MOUTH TWICE A DAY (Patient taking differently: Take 150 mg by mouth 2 (two) times daily. )   . Polyvinyl Alcohol-Povidone (REFRESH OP) Place 1 drop into both eyes daily as needed (dry eyes).   . pregabalin (LYRICA) 75 MG capsule Take 1 capsule (75 mg total) by mouth 3 (three) times daily.   . Probiotic Product (PROBIOTIC PO) Take 1 capsule by mouth daily after breakfast.    . rosuvastatin (CRESTOR) 5 MG tablet Take 1 tablet (5 mg total) by mouth daily.   . Semaglutide,0.25 or 0.5MG/DOS, (OZEMPIC, 0.25 OR 0.5 MG/DOSE,) 2 MG/1.5ML SOPN  Inject 0.5 mg into the skin once a week. (Patient taking differently: Inject 0.5 mg into the skin every Saturday. )   . sennosides-docusate sodium (SENOKOT-S) 8.6-50 MG tablet Take 1-2 tablets by mouth daily as needed for constipation.   . [DISCONTINUED] Calcium Carbonate (CALCIUM 500 PO) Take 1 capsule by mouth every other day.     No facility-administered encounter medications on file as of 02/07/2020.    Current Diagnosis: Patient Active Problem List   Diagnosis Date Noted  . Change in stool 01/23/2020  . Hypokalemia 01/06/2020  . Unstable angina ([place]) 01/05/2020  . CAD (coronary artery disease) 05/28/2018  . Neck pain 07/21/2017  . Dysuria 04/10/2017  . Decreased hearing of right ear 02/01/2017  . Primary osteoarthritis of left knee 01/12/2017  . Constipation 01/10/2017  . Hypercalcemia 10/12/2016  . Chest pain 10/12/2016  . DOE (dyspnea on exertion) 10/12/2016  . Chronic back pain 08/23/2016  . Hair loss 08/23/2016  . Diabetes ([place]) 07/26/2016  . Anxiety 09/14/2015  . Diverticulitis large intestine w/o perforation or abscess w/o bleeding 05/12/2014  . Essential hypertension 05/12/2014  . Diabetic neuropathy ([place]) 05/12/2014  . Seizure disorder ([place]) 05/12/2014  . Hepatic steatosis 05/12/2014  . Cerebrovascular disease, unspecified 03/13/2013  . Allergic rhinitis 03/13/2013  . GOITER, MULTINODULAR 05/18/2009  . VERTIGO 04/13/2009  . Irritable bowel syndrome 03/20/2009  . Dyslipidemia 03/18/2009  . GERD 03/18/2009    Goals Addressed   None     Follow-Up:  Coordination of Enhanced Pharmacy Services     Reviewed chart for medication changes ahead of medication coordination call.  The patient had an Telephone visit on 02/06/2020. No medication changes indicated.   BP Readings from Last 3 Encounters:  01/23/20 110/68  01/06/20 (!) 109/55  08/12/19 124/74    Lab Results  Component Value Date   HGBA1C 6.3 (H) 01/23/2020     Patient obtains medications  through [facility], 90-Day supply  Last adherence delivery included:   Ezetimibe 46m daily- Morning  Oxcarbazepine 1547mtwice daily- Morning, Evening  Omeprazole 2052maily- Morning  Rosuvastatin 5mg71mily-Evening  Pregabalin 75mg43mee times daily- Morning, Evening, Bedtime  Hydrochlorothiazide 25mg 38my- Morning   Patient is due for next adherence delivery on: 02/24/2020. Called patient and reviewed medications and coordinated delivery.  This delivery to include: Ezetimibe 10mg d64m- Morning  Oxcarbazepine 150mg tw41mdaily- Morning, Evening  Omeprazole 20mg dai35mMorning  Rosuvastatin 5mg daily76mening  Pregabalin 75mg three72mes daily- Morning, Evening, Bedtime   Onetouch Test Ultra test strips         Onetouch Delica Plus Lancets   Patient needs refills for Pregabalin 75mg prescr18m by Dr.  Laura MurrayJodi MourningMurray officValere [id number] RX to be faxed to [facility].  Confirmed delivery date of 02/24/2020, advised patient that pharmacy will contact them the morning of delivery.   Rosendo Gros, [place] Pharmacist Assistant  [phone]     Pharmacist addendum: -Patient saw PCP Dr Quay Burow on 01/23/20 for hospital follow up, pt was told to continue holding HCTZ and potassium. I removed HCTZ from this fill and left message for patient to inform her.  Charlene Brooke, PharmD, BCACP Clinical Pharmacist [facility] [phone]

## 2020-02-10 ENCOUNTER — Ambulatory Visit: Payer: Self-pay

## 2020-02-12 ENCOUNTER — Other Ambulatory Visit: Payer: Self-pay | Admitting: *Deleted

## 2020-02-12 NOTE — Patient Outreach (Signed)
[name]) Care Management  02/12/2020  [name] [birth year] [id number]   CSW received referral on 02/07/2020 for transportation needs/[employer]AT.  CSW attempted to reach pt and was unable.  CSW left a HIPPA compliant voice message and will attempt outreach again in 3-4 business days if no return call is received.  CSW will also mail pt an Unsuccessful outreach letter.   Eduard Clos, MSW, [place] Worker  [place] [phone]

## 2020-02-13 ENCOUNTER — Encounter: Payer: Self-pay | Admitting: Internal Medicine

## 2020-02-17 ENCOUNTER — Other Ambulatory Visit: Payer: Self-pay

## 2020-02-17 ENCOUNTER — Other Ambulatory Visit: Payer: Self-pay | Admitting: *Deleted

## 2020-02-17 ENCOUNTER — Telehealth: Payer: Self-pay | Admitting: Internal Medicine

## 2020-02-17 MED ORDER — OZEMPIC (0.25 OR 0.5 MG/DOSE) 2 MG/1.5ML ~~LOC~~ SOPN: 0.5000 mg | PEN_INJECTOR | SUBCUTANEOUS | 2 refills | Status: DC

## 2020-02-17 NOTE — Telephone Encounter (Signed)
Script sent in for patient today.

## 2020-02-17 NOTE — Patient Outreach (Signed)
[facility]) Care Management  02/17/2020  [name] 01-Dec-1940 [id number]   CSW made contact with pt and confirmed identity.  CSW introduced self, role and reason for call; transportation needs.   Per pt, she does not drive and moved into her sons home back in October.  Her son and daughter assist with transportation needs; however, pt wants to have some alternate options; stating, "They are both busy with work jobs that keep them quite busy".  CSW discussed SCAT, Armed forces technical officer and will mail info on these services as well as other info to pt.  CSW also provided pt with contact #'s to outreach them also.   CSW reminded pt to call if needs arise (transportation, etc) prior to CSW follow up call in 2-3 weeks.   Eduard Clos, MSW, [place] Worker  [place] [phone]

## 2020-02-17 NOTE — Telephone Encounter (Signed)
   1.Medication Requested:Semaglutide,0.25 or 0.5MG /DOS, (OZEMPIC, 0.25 OR 0.5 MG/DOSE,) 2 MG/1.5ML SOPN  2. Pharmacy (Name, Street, [place]): [id number] [place], [address] [place]        3. On Med List: yes  4. Last Visit with PCP: 01/23/20  5. Next visit date with PCP:   Agent: Please be advised that RX refills may take up to 3 business days. We ask that you follow-up with your pharmacy.

## 2020-02-17 NOTE — Progress Notes (Signed)
Cardiology Office Note   Date:  02/21/2020   ID:  Kristin, Coffey 03/24/1941, MRN [id number]  PCP:  Binnie Rail, MD  Cardiologist:  Dr. Tamala Julian     Chief Complaint  Patient presents with  . Hospitalization Follow-up      History of Present Illness: [name] is a 79 y.o. female who presents for post hospitalization for chest pain, though stable cardiac cath.  known history of coronary artery disease, COPD, hypertension, type 2 diabetes, history of smoking in the past. Her HR was slow so BB held and BP soft.   Cardiac cath with clacified LM, LAD and pLCX.  Patent LM, patent LAD, Large branching ramus intermedius with ostial 75% narrowing in the more anterior subbranch, unchanged from 2019   Tortuous circumflex with ostial 65 to 75% stenosis, unchanged from prior.  The ostial vessel was calcified.  Anterior origin right coronary.  Widely patent without obstructive disease.   Hyperdynamic left ventricular systolic function with EF greater than 65%.  EDP is normal.    Dr. Tamala Julian recommended alternative explanations for chest pain with no EKG changes or enzyme elevation.  To use aggressive risk factor modification.    Echo during that time frame with EF 60-65%, no RWMA, mild LVH, G1DD.   PPI continued daily.  BB was stopped.  BP soft so HCTZ and K+ held at discharge.    Today occ brief shooting pain lasting a second.  Reassured.  No SOB, feeling well. Cath site stable.  No BB due to baseline sinus bradycardia.  HTN stable .  We reviewed cath results.     Past Medical History:  Diagnosis Date  . Allergic rhinitis, cause unspecified 03/13/2013  . Anxiety   . Cerebrovascular disease, unspecified 03/13/2013   Atrophy and small vessel dz noted, MR brain 2009  . Depression   . Diabetes mellitus, type 2 ([place])   . Diverticulosis of colon 03/2010 hosp  . Dyslipidemia   . GERD (gastroesophageal reflux disease)   . Hypertension   . Neuropathy   . OSA on CPAP   .  Osteoarthritis of shoulder region    and Knee  . Seizure disorder ([place])    onset 11 years ago; repeated 2013    Past Surgical History:  Procedure Laterality Date  . ABDOMINAL HYSTERECTOMY  1970's   Partial  . APPENDECTOMY    . CHOLECYSTECTOMY    . LEFT HEART CATH AND CORONARY ANGIOGRAPHY N/A 05/28/2018   Procedure: LEFT HEART CATH AND CORONARY ANGIOGRAPHY;  Surgeon: Belva Crome, MD;  Location: [facility];  Service: Cardiovascular;  Laterality: N/A;  . LEFT HEART CATH AND CORONARY ANGIOGRAPHY N/A 01/06/2020   Procedure: LEFT HEART CATH AND CORONARY ANGIOGRAPHY;  Surgeon: Belva Crome, MD;  Location: [facility];  Service: Cardiovascular;  Laterality: N/A;  . LUMBAR EPIDURAL INJECTION Left 08/25/2017  . SHOULDER SURGERY  2008   LT, post fall   . TONSILLECTOMY AND ADENOIDECTOMY    . TOTAL KNEE ARTHROPLASTY Left 01/12/2017   Procedure: LEFT TOTAL KNEE ARTHROPLASTY;  Surgeon: Susa Day, MD;  Location: [facility];  Service: Orthopedics;  Laterality: Left;  120 mins     Current Outpatient Medications  Medication Sig Dispense Refill  . acetaminophen (TYLENOL) 500 MG tablet Take 500 mg by mouth every 6 (six) hours as needed for headache (pain).    Marland Kitchen albuterol (PROAIR HFA) 108 (90 BASE) MCG/ACT inhaler Inhale 2 puffs into the lungs every 6 (  six) hours as needed for wheezing or shortness of breath.     . Alpha-D-Galactosidase (BEANO PO) Take 1-2 tablets by mouth daily as needed (for gas).    Marland Kitchen aspirin EC 81 MG tablet Take 1 tablet (81 mg total) by mouth daily. 90 tablet 3  . azelastine (ASTELIN) 0.1 % nasal spray Place 2 sprays into both nostrils 2 (two) times daily as needed for rhinitis. Use in each nostril as directed    . blood glucose meter kit and supplies KIT Dispense based on patient and insurance preference. Use up to four times daily as directed. (FOR ICD-10: E11.9). 1 each 0  . clonazePAM (KLONOPIN) 0.5 MG tablet TAKE 1/2 TO 1 TABLET TWICE A DAY AS NEEDED FOR  ANXIETY (Patient taking differently: Take 0.5 mg by mouth 2 (two) times daily as needed for anxiety. ) 60 tablet 0  . ezetimibe (ZETIA) 10 MG tablet Take 1 tablet (10 mg total) by mouth daily. 90 tablet 1  . glucose blood (ONETOUCH ULTRA) test strip USE UP TO 4 TIMES DAILY AS DIRECTED 400 strip 3  . JARDIANCE 10 MG TABS tablet TAKE 1 TABLET BY MOUTH EVERY DAY 30 tablet 1  . Lancets MISC Use to check blood sugars up to 4 times daily as directed. E11.9 400 each 3  . linaclotide (LINZESS) 145 MCG CAPS capsule Take 1 capsule (145 mcg total) by mouth daily as needed (constipation). 30 capsule 5  . lubiprostone ([name]) 24 MCG capsule TAKE 1 CAPSULE (24 MCG TOTAL) BY MOUTH 2 (TWO) TIMES DAILY WITH A MEAL. 180 capsule 1  . meclizine (ANTIVERT) 12.5 MG tablet TAKE 1 TABLET (12.5 MG TOTAL) BY MOUTH 3 (THREE) TIMES DAILY AS NEEDED FOR DIZZINESS. 30 tablet 0  . Multiple Vitamin (MULTIVITAMIN WITH MINERALS) TABS tablet Take 1 tablet by mouth daily after lunch.    . nitroGLYCERIN (NITROSTAT) 0.4 MG SL tablet Place 1 tablet (0.4 mg total) under the tongue every 5 (five) minutes as needed for chest pain. 25 tablet 2  . omeprazole (PRILOSEC) 20 MG capsule TAKE 1 CAPSULE BY MOUTH EVERY DAY (Patient taking differently: Take 20 mg by mouth daily as needed (acid reflux/heartburn). ) 90 capsule 1  . OVER THE COUNTER MEDICATION Place 1 drop into both eyes 2 (two) times daily as needed (dry eyes). Over the counter eye drop for itching    . OXcarbazepine (TRILEPTAL) 150 MG tablet TAKE 1 TABLET BY MOUTH TWICE A DAY (Patient taking differently: Take 150 mg by mouth 2 (two) times daily. ) 180 tablet 0  . Polyvinyl Alcohol-Povidone (REFRESH OP) Place 1 drop into both eyes daily as needed (dry eyes).    . pregabalin (LYRICA) 75 MG capsule Take 1 capsule (75 mg total) by mouth 3 (three) times daily. 90 capsule 0  . Probiotic Product (PROBIOTIC PO) Take 1 capsule by mouth daily after breakfast.     . Semaglutide,0.25 or  0.5MG/DOS, (OZEMPIC, 0.25 OR 0.5 MG/DOSE,) 2 MG/1.5ML SOPN Inject 0.375 mLs (0.5 mg total) into the skin once a week. 1.5 mL 2  . sennosides-docusate sodium (SENOKOT-S) 8.6-50 MG tablet Take 1-2 tablets by mouth daily as needed for constipation.    . rosuvastatin (CRESTOR) 5 MG tablet TAKE 1 TABLET BY MOUTH EVERY DAY 90 tablet 1   No current facility-administered medications for this visit.    Allergies:   Cymbalta [duloxetine hcl], Effexor [venlafaxine], Other, Statins, and Penicillins    Social History:  The patient  reports that she quit smoking about  34 years ago. She has never used smokeless tobacco. She reports current alcohol use. She reports that she does not use drugs.   Family History:  The patient's family history includes Arthritis in her mother and another family member; Diabetes in an other family member; Heart disease in her father.    ROS:  General:no colds or fevers, no weight changes Skin:no rashes or ulcers HEENT:no blurred vision, no congestion CV:see HPI PUL:see HPI GI:no diarrhea constipation or melena, no indigestion GU:no hematuria, no dysuria MS:no joint pain, no claudication Neuro:no syncope, no lightheadedness Endo:+ diabetes, no thyroid disease  Wt Readings from Last 3 Encounters:  02/20/20 159 lb 12.8 oz (72.5 kg)  01/23/20 162 lb (73.5 kg)  01/06/20 154 lb 4.8 oz (70 kg)     PHYSICAL EXAM: VS:  BP 110/60   Pulse (!) 56   Ht _0  (1.575 m)   Wt 159 lb 12.8 oz (72.5 kg)   SpO2 99%   BMI 29.23 kg/m  , BMI Body mass index is 29.23 kg/m. General:Pleasant affect, NAD Skin:Warm and dry, brisk capillary refill HEENT:normocephalic, sclera clear, mucus membranes moist Neck:supple, no JVD, no bruits  Heart:S1S2 RRR without murmur, gallup, rub or click Lungs:clear without rales, rhonchi, or wheezes ATF:TDDU, non tender, + BS, do not palpate liver spleen or masses Ext:no lower ext edema, 2+ pedal pulses, 2+ radial pulses Neuro:alert and oriented,  MAE, follows commands, + facial symmetry    EKG:  EKG is NOT ordered today.    Recent Labs: 08/12/2019: ALT 27 01/06/2020: Hemoglobin 13.9; Platelets 234 01/23/2020: BUN 13; Creat 0.76; Potassium 3.6; Sodium 140    Lipid Panel    Component Value Date/Time   CHOL 129 08/12/2019 1412   TRIG 133.0 08/12/2019 1412   HDL 55.60 08/12/2019 1412   CHOLHDL 2 08/12/2019 1412   VLDL 26.6 08/12/2019 1412   LDLCALC 46 08/12/2019 1412   LDLDIRECT 113.0 07/21/2017 1643       Other studies Reviewed: Additional studies/ records that were reviewed today include: .   LHC 01/06/20  Calcified left main, LAD, and proximal circumflex.  Widely patent left main  Widely patent LAD  Large branching ramus intermedius with ostial 75% narrowing in the more anterior subbranch, unchanged from 2019  Tortuous circumflex with ostial 65 to 75% stenosis, unchanged from prior. The ostial vessel was calcified.  Anterior origin right coronary. Widely patent without obstructive disease.  Hyperdynamic left ventricular systolic function with EF greater than 65%. EDP is normal.  RECOMMENDATIONS:  Persisting substernal chest pressure throughout the procedure with anatomy not demonstrating disease that could account for pain at rest. Images are similar to 2019. She does have what would be a difficult to treat branch of the ramus intermedius and also ostial circumflex. These regions are unchanged compared to 2019.  Aggressive risk factor modification.  Consider alternative explanations for continuous chest discomfort in absence of EKG findings and enzyme elevation.   2D Echo 01/06/20 IMPRESSIONS  1. Left ventricular ejection fraction, by estimation, is 60 to 65%. The  left ventricle has normal function. The left ventricle has no regional  wall motion abnormalities. There is mild left ventricular hypertrophy.  Left ventricular diastolic parameters  are consistent with Grade I diastolic dysfunction  (impaired relaxation).  2. Right ventricular systolic function is normal. The right ventricular  size is normal. Tricuspid regurgitation signal is inadequate for assessing  PA pressure.  3. The mitral valve is normal in structure. Trivial mitral valve  regurgitation. No evidence  of mitral stenosis.  4. The aortic valve is tricuspid. Aortic valve regurgitation is not  visualized. Mild aortic valve sclerosis is present, with no evidence of  aortic valve stenosis.  5. The inferior vena cava is normal in size with greater than 50%  respiratory variability    ASSESSMENT AND PLAN:  1.  CAD with cath as above.  She had chest pain with cath.  But no changes in anatomy since 2019.  Follow up with Dr. Tamala Julian in 6 months.    2.  HTN controlled no med changes  3.  HLD on statin and zetia continue   4.  DM per PCP      Current medicines are reviewed with the patient today.  The patient Has no concerns regarding medicines.  The following changes have been made:  See above Labs/ tests ordered today include:see above  Disposition:   FU:  see above  Signed, Cecilie Kicks, NP  02/21/2020 2:27 PM    [facility], [place], [place], [place] Phone: [phone]; Fax: [phone]

## 2020-02-17 NOTE — Progress Notes (Signed)
Update: [facility] would like [facility].

## 2020-02-18 ENCOUNTER — Telehealth: Payer: Self-pay | Admitting: Pharmacist

## 2020-02-18 ENCOUNTER — Other Ambulatory Visit: Payer: Self-pay | Admitting: Internal Medicine

## 2020-02-18 ENCOUNTER — Telehealth: Payer: Self-pay | Admitting: Internal Medicine

## 2020-02-18 NOTE — Progress Notes (Signed)
Chronic Care Management Pharmacy Assistant   Name: [name]  MRN: [id number] DOB: May 29, 1941  Reason for Encounter: Medication Review  Patient Questions:  1.  Have you seen any other providers since your last visit? No  2.  Any changes in your medicines or health? No   PCP : Binnie Rail, MD  Allergies:   Allergies  Allergen Reactions  . Cymbalta [Duloxetine Hcl] Hives and Itching  . Effexor [Venlafaxine] Nausea Only  . Other Other (See Comments)    "SEEDED" food due to stomach issues  . Statins Nausea And Vomiting  . Penicillins Hives and Rash    Has patient had a PCN reaction causing immediate rash, facial/tongue/throat swelling, SOB or lightheadedness with hypotension: Yes Has patient had a PCN reaction causing severe rash involving mucus membranes or skin necrosis: No Has patient had a PCN reaction that required hospitalization: No Has patient had a PCN reaction occurring within the last 10 years: Yes If all of the above answers are "NO", then may proceed with Cephalosporin use.     Medications: Outpatient Encounter Medications as of 02/18/2020  Medication Sig Note  . acetaminophen (TYLENOL) 500 MG tablet Take 500 mg by mouth every 6 (six) hours as needed for headache (pain).   Marland Kitchen albuterol (PROAIR HFA) 108 (90 BASE) MCG/ACT inhaler Inhale 2 puffs into the lungs every 6 (six) hours as needed for wheezing or shortness of breath.    . Alpha-D-Galactosidase (BEANO PO) Take 1-2 tablets by mouth daily as needed (for gas).   Marland Kitchen aspirin EC 81 MG tablet Take 1 tablet (81 mg total) by mouth daily.   Marland Kitchen azelastine (ASTELIN) 0.1 % nasal spray Place 2 sprays into both nostrils 2 (two) times daily as needed for rhinitis. Use in each nostril as directed   . blood glucose meter kit and supplies KIT Dispense based on patient and insurance preference. Use up to four times daily as directed. (FOR ICD-10: E11.9).   . clonazePAM (KLONOPIN) 0.5 MG tablet TAKE 1/2 TO 1 TABLET  TWICE A DAY AS NEEDED FOR ANXIETY (Patient taking differently: Take 0.5 mg by mouth 2 (two) times daily as needed for anxiety. )   . ezetimibe (ZETIA) 10 MG tablet Take 1 tablet (10 mg total) by mouth daily.   Marland Kitchen glucose blood (ONETOUCH ULTRA) test strip USE UP TO 4 TIMES DAILY AS DIRECTED   . JARDIANCE 10 MG TABS tablet TAKE 1 TABLET BY MOUTH EVERY DAY   . Lancets MISC Use to check blood sugars up to 4 times daily as directed. E11.9   . linaclotide (LINZESS) 145 MCG CAPS capsule Take 1 capsule (145 mcg total) by mouth daily as needed (constipation).   . meclizine (ANTIVERT) 12.5 MG tablet TAKE 1 TABLET (12.5 MG TOTAL) BY MOUTH 3 (THREE) TIMES DAILY AS NEEDED FOR DIZZINESS.   . Multiple Vitamin (MULTIVITAMIN WITH MINERALS) TABS tablet Take 1 tablet by mouth daily after lunch.   . nitroGLYCERIN (NITROSTAT) 0.4 MG SL tablet Place 1 tablet (0.4 mg total) under the tongue every 5 (five) minutes as needed for chest pain. 01/05/2020: 2 tablets on 7/2 and 2 tablets on 7/8  . omeprazole (PRILOSEC) 20 MG capsule TAKE 1 CAPSULE BY MOUTH EVERY DAY (Patient taking differently: Take 20 mg by mouth daily as needed (acid reflux/heartburn). )   . OVER THE COUNTER MEDICATION Place 1 drop into both eyes 2 (two) times daily as needed (dry eyes). Over the counter eye drop for itching   .  OXcarbazepine (TRILEPTAL) 150 MG tablet TAKE 1 TABLET BY MOUTH TWICE A DAY (Patient taking differently: Take 150 mg by mouth 2 (two) times daily. )   . Polyvinyl Alcohol-Povidone (REFRESH OP) Place 1 drop into both eyes daily as needed (dry eyes).   . pregabalin (LYRICA) 75 MG capsule Take 1 capsule (75 mg total) by mouth 3 (three) times daily.   . Probiotic Product (PROBIOTIC PO) Take 1 capsule by mouth daily after breakfast.    . rosuvastatin (CRESTOR) 5 MG tablet Take 1 tablet (5 mg total) by mouth daily.   . Semaglutide,0.25 or 0.5MG/DOS, (OZEMPIC, 0.25 OR 0.5 MG/DOSE,) 2 MG/1.5ML SOPN Inject 0.375 mLs (0.5 mg total) into the skin  once a week.   . sennosides-docusate sodium (SENOKOT-S) 8.6-50 MG tablet Take 1-2 tablets by mouth daily as needed for constipation.   . [DISCONTINUED] Calcium Carbonate (CALCIUM 500 PO) Take 1 capsule by mouth every other day.     No facility-administered encounter medications on file as of 02/18/2020.    Current Diagnosis: Patient Active Problem List   Diagnosis Date Noted  . Change in stool 01/23/2020  . Hypokalemia 01/06/2020  . Unstable angina ([place]) 01/05/2020  . CAD (coronary artery disease) 05/28/2018  . Neck pain 07/21/2017  . Dysuria 04/10/2017  . Decreased hearing of right ear 02/01/2017  . Primary osteoarthritis of left knee 01/12/2017  . Constipation 01/10/2017  . Hypercalcemia 10/12/2016  . Chest pain 10/12/2016  . DOE (dyspnea on exertion) 10/12/2016  . Chronic back pain 08/23/2016  . Hair loss 08/23/2016  . Diabetes ([place]) 07/26/2016  . Anxiety 09/14/2015  . Diverticulitis large intestine w/o perforation or abscess w/o bleeding 05/12/2014  . Essential hypertension 05/12/2014  . Diabetic neuropathy ([place]) 05/12/2014  . Seizure disorder ([place]) 05/12/2014  . Hepatic steatosis 05/12/2014  . Cerebrovascular disease, unspecified 03/13/2013  . Allergic rhinitis 03/13/2013  . GOITER, MULTINODULAR 05/18/2009  . VERTIGO 04/13/2009  . Irritable bowel syndrome 03/20/2009  . Dyslipidemia 03/18/2009  . GERD 03/18/2009    Goals Addressed   None     Follow-Up:  Coordination of Enhanced Pharmacy Services     Received call from patient regarding medication management via [facility].  Patient requested an acute fill for Ozempic Injection 2/1.53m  to be delivered: 02/18/2020 Pharmacy needs refills? No  Confirmed delivery date of 02/18/2020, advised patient that pharmacy will contact them the morning of delivery.  DRosendo Gros CLaurel RunPharmacist Assistant  [phone]

## 2020-02-18 NOTE — Telephone Encounter (Signed)
Semaglutide,0.25 or 0.5MG /DOS, (OZEMPIC, 0.25 OR 0.5 MG/DOSE,) 2 MG/1.5ML SOPN  Please call patient, one bottle cost $183  Please call her about delivery, from [facility], patient states she received only one bottle and it was more expensive than last time.

## 2020-02-18 NOTE — Progress Notes (Signed)
It turns out she is supposed to be getting Ozempic through [employer] patient assistance for free (PAP approved through 06/26/20). I have contacted [facility] pharmacy to return and refund her for this Rx.   [employer]ovo [employer]isk will require another prescription for Ozempic to be faxed to [phone]. Will coordinate with care team.

## 2020-02-18 NOTE — Telephone Encounter (Signed)
Mendel Ryder are there any cheaper alternatives -   Is she in the donut hole?

## 2020-02-19 ENCOUNTER — Other Ambulatory Visit: Payer: Self-pay

## 2020-02-19 NOTE — Progress Notes (Signed)
Patient is supposed to get Ozempic through [employer] (approved through 06/26/2020). I coordinated with PCP to re-order medication, it will be mailed to the office in the next 1-2 weeks.   Patient returned Ozempic to [facility] and was refunded.

## 2020-02-20 ENCOUNTER — Other Ambulatory Visit: Payer: Self-pay | Admitting: Internal Medicine

## 2020-02-20 ENCOUNTER — Other Ambulatory Visit: Payer: Self-pay

## 2020-02-20 ENCOUNTER — Ambulatory Visit (INDEPENDENT_AMBULATORY_CARE_PROVIDER_SITE_OTHER): Payer: HMO | Admitting: Cardiology

## 2020-02-20 ENCOUNTER — Encounter: Payer: Self-pay | Admitting: Cardiology

## 2020-02-20 VITALS — BP 110/60 | HR 56 | Ht 62.0 in | Wt 159.8 lb

## 2020-02-20 DIAGNOSIS — I10 Essential (primary) hypertension: Secondary | ICD-10-CM | POA: Diagnosis not present

## 2020-02-20 DIAGNOSIS — E782 Mixed hyperlipidemia: Secondary | ICD-10-CM

## 2020-02-20 DIAGNOSIS — E118 Type 2 diabetes mellitus with unspecified complications: Secondary | ICD-10-CM | POA: Diagnosis not present

## 2020-02-20 DIAGNOSIS — I2510 Atherosclerotic heart disease of native coronary artery without angina pectoris: Secondary | ICD-10-CM | POA: Diagnosis not present

## 2020-02-20 NOTE — Patient Instructions (Signed)
Medication Instructions:  Your physician recommends that you continue on your current medications as directed. Please refer to the Current Medication list given to you today.  *If you need a refill on your cardiac medications before your next appointment, please call your pharmacy*   Lab Work: None ordered  If you have labs (blood work) drawn today and your tests are completely normal, you will receive your results only by: . MyChart Message (if you have MyChart) OR . A paper copy in the mail If you have any lab test that is abnormal or we need to change your treatment, we will call you to review the results.   Testing/Procedures: None ordered   Follow-Up: At [facility], you and your health needs are our priority.  As part of our continuing mission to provide you with exceptional heart care, we have created designated Provider Care Teams.  These Care Teams include your primary Cardiologist (physician) and Advanced Practice Providers (APPs -  Physician Assistants and Nurse Practitioners) who all work together to provide you with the care you need, when you need it.  We recommend signing up for the patient portal called "MyChart".  Sign up information is provided on this After Visit Summary.  MyChart is used to connect with patients for Virtual Visits (Telemedicine).  Patients are able to view lab/test results, encounter notes, upcoming appointments, etc.  Non-urgent messages can be sent to your provider as well.   To learn more about what you can do with MyChart, go to [email or web address].    Your next appointment:   6 month(s)  The format for your next appointment:   In Person  Provider:   You may see Henry W Smith III, MD or one of the following Advanced Practice Providers on your designated Care Team:    Lori Gerhardt, NP  Laura Ingold, NP  Jill McDaniel, NP    Other Instructions   

## 2020-02-21 ENCOUNTER — Encounter: Payer: Self-pay | Admitting: Cardiology

## 2020-02-24 ENCOUNTER — Telehealth: Payer: HMO

## 2020-02-25 ENCOUNTER — Other Ambulatory Visit: Payer: Self-pay

## 2020-02-25 MED ORDER — OZEMPIC (0.25 OR 0.5 MG/DOSE) 2 MG/1.5ML ~~LOC~~ SOPN: 0.5000 mg | PEN_INJECTOR | SUBCUTANEOUS | 2 refills | Status: DC

## 2020-02-27 ENCOUNTER — Encounter: Payer: Self-pay | Admitting: Nurse Practitioner

## 2020-02-27 ENCOUNTER — Telehealth: Payer: Self-pay | Admitting: Internal Medicine

## 2020-02-27 NOTE — Progress Notes (Signed)
Contacted patient. She questioned if Ozempic from [employer] had arrived in the office yet. Informed her that the refill order was faxed earlier this week and we will let her know when her Ozempic supply arrives at the office. Pt voiced understanding.

## 2020-02-27 NOTE — Telephone Encounter (Signed)
Ria Comment, patient would like for you to call her at [phone]

## 2020-03-06 ENCOUNTER — Other Ambulatory Visit: Payer: Self-pay | Admitting: *Deleted

## 2020-03-06 NOTE — Patient Outreach (Signed)
[facility] [place]) Care Management  03/06/2020  [name] [birth year] [id number]   CSW spoke with pt who has begun to complete the paperwork but has not had time to complete.  Pt denies any questions related to the paperwork and is comfortable and requesting plans to contact CSW if any needs/questions arise.  CSW will sign off at this time and will advise PCP and [facility] team.     Eduard Clos, MSW, [place] Worker  [place] [phone]

## 2020-03-08 ENCOUNTER — Other Ambulatory Visit: Payer: Self-pay | Admitting: Family

## 2020-03-09 ENCOUNTER — Other Ambulatory Visit: Payer: Self-pay | Admitting: Internal Medicine

## 2020-03-09 NOTE — Telephone Encounter (Signed)
Check [place] registry last filled 02/17/2020.,./lmb

## 2020-03-10 ENCOUNTER — Telehealth: Payer: Self-pay | Admitting: Pharmacist

## 2020-03-10 ENCOUNTER — Telehealth: Payer: HMO

## 2020-03-10 NOTE — Progress Notes (Signed)
Chronic Care Management Pharmacy Assistant   Name: [name]  MRN: [id number] DOB: April 20, 1941  Reason for Encounter: Medication Review  Patient Questions:  1.  Have you seen any other providers since your last visit? No  2.  Any changes in your medicines or health? No   PCP : Binnie Rail, MD  Allergies:   Allergies  Allergen Reactions  . Cymbalta [Duloxetine Hcl] Hives and Itching  . Effexor [Venlafaxine] Nausea Only  . Other Other (See Comments)    "SEEDED" food due to stomach issues  . Statins Nausea And Vomiting  . Penicillins Hives and Rash    Has patient had a PCN reaction causing immediate rash, facial/tongue/throat swelling, SOB or lightheadedness with hypotension: Yes Has patient had a PCN reaction causing severe rash involving mucus membranes or skin necrosis: No Has patient had a PCN reaction that required hospitalization: No Has patient had a PCN reaction occurring within the last 10 years: Yes If all of the above answers are "NO", then may proceed with Cephalosporin use.     Medications: Outpatient Encounter Medications as of 03/10/2020  Medication Sig Note  . pregabalin (LYRICA) 75 MG capsule TAKE ONE CAPSULE BY MOUTH THREE TIMES DAILY   . acetaminophen (TYLENOL) 500 MG tablet Take 500 mg by mouth every 6 (six) hours as needed for headache (pain).   Marland Kitchen albuterol (PROAIR HFA) 108 (90 BASE) MCG/ACT inhaler Inhale 2 puffs into the lungs every 6 (six) hours as needed for wheezing or shortness of breath.    . Alpha-D-Galactosidase (BEANO PO) Take 1-2 tablets by mouth daily as needed (for gas).   Marland Kitchen aspirin EC 81 MG tablet Take 1 tablet (81 mg total) by mouth daily.   Marland Kitchen azelastine (ASTELIN) 0.1 % nasal spray Place 2 sprays into both nostrils 2 (two) times daily as needed for rhinitis. Use in each nostril as directed   . blood glucose meter kit and supplies KIT Dispense based on patient and insurance preference. Use up to four times daily as directed. (FOR  ICD-10: E11.9).   . clonazePAM (KLONOPIN) 0.5 MG tablet TAKE 1/2 TO 1 TABLET TWICE A DAY AS NEEDED FOR ANXIETY (Patient taking differently: Take 0.5 mg by mouth 2 (two) times daily as needed for anxiety. )   . ezetimibe (ZETIA) 10 MG tablet Take 1 tablet (10 mg total) by mouth daily.   Marland Kitchen glucose blood (ONETOUCH ULTRA) test strip USE UP TO 4 TIMES DAILY AS DIRECTED   . JARDIANCE 10 MG TABS tablet TAKE 1 TABLET BY MOUTH EVERY DAY   . Lancets MISC Use to check blood sugars up to 4 times daily as directed. E11.9   . linaclotide (LINZESS) 145 MCG CAPS capsule Take 1 capsule (145 mcg total) by mouth daily as needed (constipation).   . lubiprostone ([name]) 24 MCG capsule TAKE 1 CAPSULE (24 MCG TOTAL) BY MOUTH 2 (TWO) TIMES DAILY WITH A MEAL.   Marland Kitchen meclizine (ANTIVERT) 12.5 MG tablet TAKE 1 TABLET (12.5 MG TOTAL) BY MOUTH 3 (THREE) TIMES DAILY AS NEEDED FOR DIZZINESS.   . Multiple Vitamin (MULTIVITAMIN WITH MINERALS) TABS tablet Take 1 tablet by mouth daily after lunch.   . nitroGLYCERIN (NITROSTAT) 0.4 MG SL tablet Place 1 tablet (0.4 mg total) under the tongue every 5 (five) minutes as needed for chest pain. 01/05/2020: 2 tablets on 7/2 and 2 tablets on 7/8  . omeprazole (PRILOSEC) 20 MG capsule TAKE 1 CAPSULE BY MOUTH EVERY DAY (Patient taking differently: Take  20 mg by mouth daily as needed (acid reflux/heartburn). )   . OVER THE COUNTER MEDICATION Place 1 drop into both eyes 2 (two) times daily as needed (dry eyes). Over the counter eye drop for itching   . OXcarbazepine (TRILEPTAL) 150 MG tablet TAKE 1 TABLET BY MOUTH TWICE A DAY (Patient taking differently: Take 150 mg by mouth 2 (two) times daily. )   . Polyvinyl Alcohol-Povidone (REFRESH OP) Place 1 drop into both eyes daily as needed (dry eyes).   . Probiotic Product (PROBIOTIC PO) Take 1 capsule by mouth daily after breakfast.    . rosuvastatin (CRESTOR) 5 MG tablet TAKE 1 TABLET BY MOUTH EVERY DAY   . Semaglutide,0.25 or 0.5MG/DOS, (OZEMPIC, 0.25  OR 0.5 MG/DOSE,) 2 MG/1.5ML SOPN Inject 0.375 mLs (0.5 mg total) into the skin once a week.   . sennosides-docusate sodium (SENOKOT-S) 8.6-50 MG tablet Take 1-2 tablets by mouth daily as needed for constipation.   . [DISCONTINUED] Calcium Carbonate (CALCIUM 500 PO) Take 1 capsule by mouth every other day.     No facility-administered encounter medications on file as of 03/10/2020.    Current Diagnosis: Patient Active Problem List   Diagnosis Date Noted  . Change in stool 01/23/2020  . Hypokalemia 01/06/2020  . Unstable angina ([place]) 01/05/2020  . CAD (coronary artery disease) 05/28/2018  . Neck pain 07/21/2017  . Dysuria 04/10/2017  . Decreased hearing of right ear 02/01/2017  . Primary osteoarthritis of left knee 01/12/2017  . Constipation 01/10/2017  . Hypercalcemia 10/12/2016  . Chest pain 10/12/2016  . DOE (dyspnea on exertion) 10/12/2016  . Chronic back pain 08/23/2016  . Hair loss 08/23/2016  . Diabetes ([place]) 07/26/2016  . Anxiety 09/14/2015  . Diverticulitis large intestine w/o perforation or abscess w/o bleeding 05/12/2014  . Essential hypertension 05/12/2014  . Diabetic neuropathy ([place]) 05/12/2014  . Seizure disorder ([place]) 05/12/2014  . Hepatic steatosis 05/12/2014  . Cerebrovascular disease, unspecified 03/13/2013  . Allergic rhinitis 03/13/2013  . GOITER, MULTINODULAR 05/18/2009  . VERTIGO 04/13/2009  . Irritable bowel syndrome 03/20/2009  . Dyslipidemia 03/18/2009  . GERD 03/18/2009    Goals Addressed   None     Follow-Up:  Coordination of Enhanced Pharmacy Services     Reviewed chart for medication changes ahead of medication coordination call.  No OVs, Consults, or hospital visits since last care coordination call/Pharmacist visit.  No medication changes indicated.   BP Readings from Last 3 Encounters:  02/20/20 110/60  01/23/20 110/68  01/06/20 (!) 109/55    Lab Results  Component Value Date   HGBA1C 6.3 (H) 01/23/2020     Patient obtains  medications through Adherence Packaging  90 Days   Last adherence delivery included:  Ozempic injection 2/1.27m inject 0.3739m( 0.60m57motal )  Pregabalin 760m34mree times daily- Breakfast, Lunch, Dinner  Omeprazole 20mg50mly- Breakfast  Rosuvastatin 60mg d66my- Bedtime  Ezetimibe 10mg d53m-Breakfast   Patient is due for next adherence delivery on: 03/18/2020. Called patient and reviewed medications and coordinated delivery.  This delivery to include:  Ozempic injection 2/1.60mL inj48m 0.3760mL ( 027m total29m Pregabalin 760mg three21mes daily- Breakfast, Lunch, Dinner  Omeprazole 20mg daily-41makfast  Rosuvastatin 60mg daily- B30mime  Ezetimibe 10mg daily-Br21mast    The patient will need refills on Rosuvastatin 60mg prescribed52m Dr. Burns a messageQuay Burowbeen sent to CPP [place] to requMendel Ryderills.   Confirmed delivery date of 03/18/2020, advised patient that pharmacy will contact them the morning of delivery.  Rosendo Gros, [place] Pharmacist Assistant  [phone]

## 2020-03-12 ENCOUNTER — Telehealth: Payer: Self-pay

## 2020-03-12 NOTE — Telephone Encounter (Signed)
Called and left message for patient today to let her know samples were in from patient assistance with Ozempic.

## 2020-03-13 ENCOUNTER — Other Ambulatory Visit: Payer: Self-pay | Admitting: Internal Medicine

## 2020-03-18 ENCOUNTER — Other Ambulatory Visit: Payer: Self-pay

## 2020-03-18 ENCOUNTER — Ambulatory Visit: Payer: HMO | Admitting: Pharmacist

## 2020-03-18 DIAGNOSIS — E1142 Type 2 diabetes mellitus with diabetic polyneuropathy: Secondary | ICD-10-CM

## 2020-03-18 DIAGNOSIS — E782 Mixed hyperlipidemia: Secondary | ICD-10-CM

## 2020-03-18 DIAGNOSIS — I10 Essential (primary) hypertension: Secondary | ICD-10-CM

## 2020-03-18 DIAGNOSIS — I2510 Atherosclerotic heart disease of native coronary artery without angina pectoris: Secondary | ICD-10-CM

## 2020-03-18 NOTE — Chronic Care Management (AMB) (Signed)
Chronic Care Management Pharmacy  Name: [name]  MRN: [id number] DOB: [birth year]  Chief Complaint/ HPI  [name],  79 y.o. , female presents for their Follow-Up CCM visit with the clinical pharmacist via telephone due to COVID-19 Pandemic.  PCP : Binnie Rail, MD  Their chronic conditions include: HTN, CAD, T2DM, HLD, GERD, IBS, seizure disorder, allergies, arthritis  Pt reports she takes care of herself, but does get tired doing household chores. He did have a fall in November, still has pain from that- R arm pain, does stretches with both arms.    Office Visits: 01/23/20 Dr Quay Burow OV: continue to hold HCTZ and potassium, monitor BP.  08/12/19 Dr Quay Burow Ok EdwardsRolan Lipa working better than amitiza, gave samples and sent refills. Rx'd meclizine for vertigo. Started omeprazole due to famotidine ineffective.  Consult Visit:  02/20/20 NP Cecilie Kicks (cardiology): reviewed CATH results from hospital, do not explain chest pain, images similar to 2019. Risk factor modification rec'd, no med changes.  02/04/20 [facility] - R shoulder injection given.  01/05/20 - 01/06/20 Hospital admission: chest pain, LHC 7/12, rec'd aggressive risk factor modification. CP likely musculoskeletal. Scheduled PPI. Hold HCTZ (and KCl) at discharge due to soft BP inpatient.  Allergies  Allergen Reactions  . Cymbalta [Duloxetine Hcl] Hives and Itching  . Effexor [Venlafaxine] Nausea Only  . Other Other (See Comments)    "SEEDED" food due to stomach issues  . Statins Nausea And Vomiting  . Penicillins Hives and Rash    Has patient had a PCN reaction causing immediate rash, facial/tongue/throat swelling, SOB or lightheadedness with hypotension: Yes Has patient had a PCN reaction causing severe rash involving mucus membranes or skin necrosis: No Has patient had a PCN reaction that required hospitalization: No Has patient had a PCN reaction occurring within the last 10 years: Yes If all  of the above answers are "NO", then may proceed with Cephalosporin use.    Medications: Outpatient Encounter Medications as of 03/18/2020  Medication Sig Note  . acetaminophen (TYLENOL) 500 MG tablet Take 500 mg by mouth every 6 (six) hours as needed for headache (pain).   Marland Kitchen albuterol (PROAIR HFA) 108 (90 BASE) MCG/ACT inhaler Inhale 2 puffs into the lungs every 6 (six) hours as needed for wheezing or shortness of breath.    . Alpha-D-Galactosidase (BEANO PO) Take 1-2 tablets by mouth daily as needed (for gas).   Marland Kitchen aspirin EC 81 MG tablet Take 1 tablet (81 mg total) by mouth daily.   Marland Kitchen azelastine (ASTELIN) 0.1 % nasal spray Place 2 sprays into both nostrils 2 (two) times daily as needed for rhinitis. Use in each nostril as directed   . blood glucose meter kit and supplies KIT Dispense based on patient and insurance preference. Use up to four times daily as directed. (FOR ICD-10: E11.9).   . clonazePAM (KLONOPIN) 0.5 MG tablet TAKE 1/2 TO 1 TABLET TWICE A DAY AS NEEDED FOR ANXIETY (Patient taking differently: Take 0.5 mg by mouth 2 (two) times daily as needed for anxiety. )   . ezetimibe (ZETIA) 10 MG tablet Take 1 tablet (10 mg total) by mouth daily.   Marland Kitchen glucose blood (ONETOUCH ULTRA) test strip USE UP TO 4 TIMES DAILY AS DIRECTED   . JARDIANCE 10 MG TABS tablet TAKE 1 TABLET BY MOUTH EVERY DAY   . Lancets MISC Use to check blood sugars up to 4 times daily as directed. E11.9   . linaclotide (LINZESS) 145 MCG CAPS  capsule Take 1 capsule (145 mcg total) by mouth daily as needed (constipation).   . lubiprostone ([name]) 24 MCG capsule TAKE 1 CAPSULE (24 MCG TOTAL) BY MOUTH 2 (TWO) TIMES DAILY WITH A MEAL.   Marland Kitchen meclizine (ANTIVERT) 12.5 MG tablet TAKE 1 TABLET (12.5 MG TOTAL) BY MOUTH 3 (THREE) TIMES DAILY AS NEEDED FOR DIZZINESS.   . Multiple Vitamin (MULTIVITAMIN WITH MINERALS) TABS tablet Take 1 tablet by mouth daily after lunch.   . nitroGLYCERIN (NITROSTAT) 0.4 MG SL tablet Place 1 tablet (0.4  mg total) under the tongue every 5 (five) minutes as needed for chest pain. 01/05/2020: 2 tablets on 7/2 and 2 tablets on 7/8  . omeprazole (PRILOSEC) 20 MG capsule TAKE 1 CAPSULE BY MOUTH EVERY DAY (Patient taking differently: Take 20 mg by mouth daily as needed (acid reflux/heartburn). )   . OVER THE COUNTER MEDICATION Place 1 drop into both eyes 2 (two) times daily as needed (dry eyes). Over the counter eye drop for itching   . OXcarbazepine (TRILEPTAL) 150 MG tablet TAKE 1 TABLET BY MOUTH TWICE A DAY (Patient taking differently: Take 150 mg by mouth 2 (two) times daily. )   . Polyvinyl Alcohol-Povidone (REFRESH OP) Place 1 drop into both eyes daily as needed (dry eyes).   . pregabalin (LYRICA) 75 MG capsule TAKE ONE CAPSULE BY MOUTH THREE TIMES DAILY   . Probiotic Product (PROBIOTIC PO) Take 1 capsule by mouth daily after breakfast.    . rosuvastatin (CRESTOR) 5 MG tablet TAKE 1 TABLET BY MOUTH EVERY DAY   . Semaglutide,0.25 or 0.5MG/DOS, (OZEMPIC, 0.25 OR 0.5 MG/DOSE,) 2 MG/1.5ML SOPN Inject 0.375 mLs (0.5 mg total) into the skin once a week. 03/10/2020: Via [facility] PAP through 06/26/2020  . sennosides-docusate sodium (SENOKOT-S) 8.6-50 MG tablet Take 1-2 tablets by mouth daily as needed for constipation.   . [DISCONTINUED] Calcium Carbonate (CALCIUM 500 PO) Take 1 capsule by mouth every other day.     No facility-administered encounter medications on file as of 03/18/2020.   Wt Readings from Last 3 Encounters:  02/20/20 159 lb 12.8 oz (72.5 kg)  01/23/20 162 lb (73.5 kg)  01/06/20 154 lb 4.8 oz (70 kg)    Current Diagnosis/Assessment:   Goals Addressed            This Visit's Progress   . Pharmacy Care Plan       CARE PLAN ENTRY (see longitudinal plan of care for additional care plan information)  Current Barriers:  . Chronic Disease Management support, education, and care coordination needs related to Hypertension, Hyperlipidemia, Diabetes, and Coronary Artery Disease     Hypertension BP Readings from Last 3 Encounters:  02/20/20 110/60  01/23/20 110/68  01/06/20 (!) 109/55 .  Pharmacist Clinical Goal(s): o Over the next 180 days, patient will work with PharmD and providers to maintain BP goal <130/80 . Current regimen:  o No medications . Interventions: o Discussed BP goals and benefits of medications for prevention of heart attack / stroke . Patient self care activities - Over the next 180 days, patient will: o Check BP as needed, document, and provide at future appointments o Ensure daily salt intake < 2300 mg/day  Hyperlipidemia / CAD Lab Results  Component Value Date/Time   LDLCALC 46 08/12/2019 02:12 PM   LDLDIRECT 113.0 07/21/2017 04:43 PM .  Pharmacist Clinical Goal(s): o Over the next 180 days, patient will work with PharmD and providers to maintain LDL goal < 70 . Current regimen:  o rosuvastatin 5 mg daily o ezetimibe 10 mg daily o aspirin 81 mg daily o nitroglycerin 0.4 mg as needed . Interventions: o Discussed cholesterol goals and benefits of medications for prevention of heart attack / stroke . Patient self care activities - Over the next 180 days, patient will: o Continue current medications  Diabetes Lab Results  Component Value Date/Time   HGBA1C 6.3 (H) 01/23/2020 11:11 AM   HGBA1C 6.9 (H) 08/12/2019 02:12 PM .  Pharmacist Clinical Goal(s): o Over the next 180 days, patient will work with PharmD and providers to maintain A1c goal <7% . Current regimen:  o Jardiance 10 mg daily (via [facility] - delivered to patient) o Ozempic 0.5 mg weekly (via [employer] - delivered to office) . Interventions: o Discussed blood sugar goals and benefits of medications for prevention of diabetic complications . Patient self care activities - Over the next 180 days, patient will: o Check blood sugar twice daily, document, and provide at future appointments o Contact provider with any episodes of hypoglycemia o Contact [facility] for  refills of Jardiance.  o Contact office for refills of Ozempic via [employer]  Medication management . Pharmacist Clinical Goal(s): o Over the next 180 days, patient will work with PharmD and providers to achieve optimal medication adherence . Current pharmacy: [facility] . Interventions o Comprehensive medication review performed. o Utilize [facility] for medication synchronization, packaging and delivery . Patient self care activities - Over the next 180 days, patient will: o Focus on medication adherence by pill packs o Take medications as prescribed o Report any questions or concerns to PharmD and/or provider(s)  Please see past updates related to this goal by clicking on the "Past Updates" button in the selected goal        Diabetes   A1c goal < 7%  Recent Relevant Labs: Lab Results  Component Value Date/Time   HGBA1C 6.3 (H) 01/23/2020 11:11 AM   HGBA1C 6.9 (H) 08/12/2019 02:12 PM   GFR 96.68 08/12/2019 02:12 PM   GFR 95.26 02/06/2019 12:20 PM   MICROALBUR 6.7 (H) 02/06/2019 12:20 PM   MICROALBUR 2.6 (H) 02/07/2018 04:24 PM   Eye exam:  Lab Results  Component Value Date/Time   HMDIABEYEEXA No Retinopathy 11/01/2018 12:00 AM    Last diabetic Foot exam: No results found for: HMDIABFOOTEX   Checking BG: Daily  Recent FBG Readings: 92-102  Patient has failed these meds in past: Farxiga, glipizide, metformin Patient is currently controlled on the following medications:   Jardiance 10 mg daily ([facility] - delivered to pt)  Ozempic 0.5 mg weekly [employer] - delivered to office)  We discussed: diet and exercise extensively, A1c goals and importance of self monitoring; discussed PAP, gave pt [facility] phone number to order refills of Jardiance. She just picked up Ozempic from the office.  Plan  Continue current medications and control with diet and exercise   Hypertension   BP goal is:  <130/80  Office blood pressures are  BP Readings from Last 3  Encounters:  02/20/20 110/60  01/23/20 110/68  01/06/20 (!) 109/55   Kidney Function Lab Results  Component Value Date/Time   CREATININE 0.76 01/23/2020 11:11 AM   CREATININE 0.77 01/06/2020 03:19 AM   CREATININE 0.87 01/05/2020 02:18 PM   GFR 96.68 08/12/2019 02:12 PM   GFRNONAA 76 01/23/2020 11:11 AM   GFRAA 88 01/23/2020 11:11 AM   K 3.6 01/23/2020 11:11 AM   K 3.1 (L) 01/06/2020 03:19  AM   Patient has failed these meds in the past: n/a Patient is currently controlled on the following medications:   No medications  Patient checks BP at home daily  Patient home BP readings are ranging: SBP 119-136  We discussed diet and exercise extensively; pt was taken off of HCTZ and potassium after hospitalization July 2021. BP is doing well at home.  Plan  Continue control with diet and exercise     Hyperlipidemia/CAD   LDL goal < 70  Lipid Panel     Component Value Date/Time   CHOL 129 08/12/2019 1412   TRIG 133.0 08/12/2019 1412   HDL 55.60 08/12/2019 1412   CHOLHDL 2 08/12/2019 1412   VLDL 26.6 08/12/2019 1412   LDLCALC 46 08/12/2019 1412   LDLDIRECT 113.0 07/21/2017 1643   Hepatic Function Latest Ref Rng & Units 08/12/2019 02/06/2019 11/05/2018  Total Protein 6.0 - 8.3 g/dL 7.4 7.7 8.3  Albumin 3.5 - 5.2 g/dL 4.3 4.7 4.8  AST 0 - 37 U/L '28 19 22  ' ALT 0 - 35 U/L '27 15 16  ' Alk Phosphatase 39 - 117 U/L 62 56 57  Total Bilirubin 0.2 - 1.2 mg/dL 0.3 0.3 0.3  Bilirubin, Direct 0.0 - 0.3 mg/dL - - -   Patient has failed these meds in past: n/a Patient is currently controlled on the following medications:   rosuvastatin 5 mg daily,   ezetimibe 10 mg daily,   aspirin 81 mg daily,   nitroglycerin 0.4 mg SL prn  We discussed:  diet and exercise extensively; Cholesterol goals; benefits of statin for ASCVD risk reduction  Plan  Continue current medications and control with diet and exercise   GERD   Patient has failed these meds in past: n/a Patient is  currently controlled on the following medications:   omeprazole 20 mg daily  Famotodine 20 mg PRN  Pepto Bismol PRN  We discussed: Pt reports breakthrough heartburn with omeprazole, uses pepto bismol 30 mg and famotidine prn - about once a week.  Plan  Continue current medications  Constipation   Patient has failed these meds in past: n/a Patient is currently controlled on the following medications:   Linzess 145 mcg daily prn (samples),   senna-docusate,   Probiotic daily  Miralax PRN  Beano PRN  Gas-X PRN  We discussed:  Pt uses senna-docusate first, then will take Linzess if no relief.   Plan  Continue current medications  Seizure disorder   Patient has failed these meds in past: n/a Patient is currently controlled on the following medications:   oxcarbazepine 150 mg BID with meals  We discussed: Pt reports 1 mild seizure in last 3 years.   Plan  Continue current medications   Neuropathy   Patient has failed these meds in past: n/a Patient is currently controlled on the following medications:   pregabalin 75 mg TID  We discussed: Pt report medication does help with nerve pain.  Plan  Continue current medications   Arthritis   Patient has failed these meds in past: n/a Patient is currently controlled on the following medications:   Tylenol - 2 tabs  We discussed:  Pain is manageable with daily Tylenol.  Plan  Continue current medications  Anxiety   No GAD-7 on file.  Patient is currently controlled on the following medications:   clonazepam 0.5 mg prn  We discussed:  Does not take every day, only about once a week.   Plan  Continue current medications  Dizziness/Vertigo   Patient is currently controlled on the following medications:   meclizine 12.5 mg TID prn  We discussed:  Pt has vertigo occasionally, medication does help.  Plan  Continue current medications and control with diet and exercise   Health  Maintenance   Patient is currently controlled on the following medications:   multivitamin,   Astelin nasal spray,   albuterol HFA prn,   Allegra  We discussed:  Doesn't use nasal spray. Wearing a mask outside helps with pollen.   Plan  Continue current medications    Medication Management   Pt uses [facility] for all medications Does not use pill box Pt endorses 99% compliance - misses dose about once a month  We discussed: Verbal consent obtained for [facility] enhanced pharmacy services (medication synchronization, adherence packaging, delivery coordination). A medication sync plan was created to allow patient to get all medications delivered once every 30 to 90 days per patient preference. Patient understands they have freedom to choose pharmacy and clinical pharmacist will coordinate care between all prescribers and [facility].  Currently enrolled in patient assistance for: Jardiance ([facility]) Lyrica Therapist, music) Ozempic [employer]) Proventil ([employer])  Interior and spatial designer for medication synchronization, packaging and delivery Start PAP renewals in December 2021 for 2022     Follow up: 3 month phone visit  Charlene Brooke, PharmD, BCACP Clinical Pharmacist [facility] [phone]

## 2020-03-18 NOTE — Patient Instructions (Addendum)
Visit Information  Phone number for Pharmacist: [phone]  Goals Addressed            This Visit's Progress   . Pharmacy Care Plan       CARE PLAN ENTRY (see longitudinal plan of care for additional care plan information)  Current Barriers:  . Chronic Disease Management support, education, and care coordination needs related to Hypertension, Hyperlipidemia, Diabetes, and Coronary Artery Disease   Hypertension BP Readings from Last 3 Encounters:  02/20/20 110/60  01/23/20 110/68  01/06/20 (!) 109/55 .  Pharmacist Clinical Goal(s): o Over the next 180 days, patient will work with PharmD and providers to maintain BP goal <130/80 . Current regimen:  o No medications . Interventions: o Discussed BP goals and benefits of medications for prevention of heart attack / stroke . Patient self care activities - Over the next 180 days, patient will: o Check BP as needed, document, and provide at future appointments o Ensure daily salt intake < 2300 mg/day  Hyperlipidemia / CAD Lab Results  Component Value Date/Time   LDLCALC 46 08/12/2019 02:12 PM   LDLDIRECT 113.0 07/21/2017 04:43 PM .  Pharmacist Clinical Goal(s): o Over the next 180 days, patient will work with PharmD and providers to maintain LDL goal < 70 . Current regimen:  o rosuvastatin 5 mg daily o ezetimibe 10 mg daily o aspirin 81 mg daily o nitroglycerin 0.4 mg as needed . Interventions: o Discussed cholesterol goals and benefits of medications for prevention of heart attack / stroke . Patient self care activities - Over the next 180 days, patient will: o Continue current medications  Diabetes Lab Results  Component Value Date/Time   HGBA1C 6.3 (H) 01/23/2020 11:11 AM   HGBA1C 6.9 (H) 08/12/2019 02:12 PM .  Pharmacist Clinical Goal(s): o Over the next 180 days, patient will work with PharmD and providers to maintain A1c goal <7% . Current regimen:  o Jardiance 10 mg daily (via [facility] - delivered to  patient) o Ozempic 0.5 mg weekly (via [employer] - delivered to office) . Interventions: o Discussed blood sugar goals and benefits of medications for prevention of diabetic complications . Patient self care activities - Over the next 180 days, patient will: o Check blood sugar twice daily, document, and provide at future appointments o Contact provider with any episodes of hypoglycemia o Contact [facility] for refills of Jardiance.  o Contact office for refills of Ozempic via [employer]  Medication management . Pharmacist Clinical Goal(s): o Over the next 180 days, patient will work with PharmD and providers to achieve optimal medication adherence . Current pharmacy: [facility] . Interventions o Comprehensive medication review performed. o Utilize [facility] pharmacy for medication synchronization, packaging and delivery . Patient self care activities - Over the next 180 days, patient will: o Focus on medication adherence by pill packs o Take medications as prescribed o Report any questions or concerns to PharmD and/or provider(s)  Please see past updates related to this goal by clicking on the "Past Updates" button in the selected goal       Patient verbalizes understanding of instructions provided today.  Telephone follow up appointment with pharmacy team member scheduled for: 3 months  Charlene Brooke, PharmD, BCACP Clinical Pharmacist [facility] [phone]  Diabetes Mellitus and Nutrition, Adult When you have diabetes (diabetes mellitus), it is very important to have healthy eating habits because your blood sugar (glucose) levels are greatly affected by what you eat and drink.  Eating healthy foods in the appropriate amounts, at about the same times every day, can help you:  Control your blood glucose.  Lower your risk of heart disease.  Improve your blood pressure.  Reach or maintain a healthy weight. Every person with diabetes is different, and  each person has different needs for a meal plan. Your health care provider may recommend that you work with a diet and nutrition specialist (dietitian) to make a meal plan that is best for you. Your meal plan may vary depending on factors such as:  The calories you need.  The medicines you take.  Your weight.  Your blood glucose, blood pressure, and cholesterol levels.  Your activity level.  Other health conditions you have, such as heart or kidney disease. How do carbohydrates affect me? Carbohydrates, also called carbs, affect your blood glucose level more than any other type of food. Eating carbs naturally raises the amount of glucose in your blood. Carb counting is a method for keeping track of how many carbs you eat. Counting carbs is important to keep your blood glucose at a healthy level, especially if you use insulin or take certain oral diabetes medicines. It is important to know how many carbs you can safely have in each meal. This is different for every person. Your dietitian can help you calculate how many carbs you should have at each meal and for each snack. Foods that contain carbs include:  Bread, cereal, rice, pasta, and crackers.  Potatoes and corn.  Peas, beans, and lentils.  Milk and yogurt.  Fruit and juice.  Desserts, such as cakes, cookies, ice cream, and candy. How does alcohol affect me? Alcohol can cause a sudden decrease in blood glucose (hypoglycemia), especially if you use insulin or take certain oral diabetes medicines. Hypoglycemia can be a life-threatening condition. Symptoms of hypoglycemia (sleepiness, dizziness, and confusion) are similar to symptoms of having too much alcohol. If your health care provider says that alcohol is safe for you, follow these guidelines:  Limit alcohol intake to no more than 1 drink per day for nonpregnant women and 2 drinks per day for men. One drink equals 12 oz of beer, 5 oz of wine, or 1 oz of hard liquor.  Do not  drink on an empty stomach.  Keep yourself hydrated with water, diet soda, or unsweetened iced tea.  Keep in mind that regular soda, juice, and other mixers may contain a lot of sugar and must be counted as carbs. What are tips for following this plan?  Reading food labels  Start by checking the serving size on the "Nutrition Facts" label of packaged foods and drinks. The amount of calories, carbs, fats, and other nutrients listed on the label is based on one serving of the item. Many items contain more than one serving per package.  Check the total grams (g) of carbs in one serving. You can calculate the number of servings of carbs in one serving by dividing the total carbs by 15. For example, if a food has 30 g of total carbs, it would be equal to 2 servings of carbs.  Check the number of grams (g) of saturated and trans fats in one serving. Choose foods that have low or no amount of these fats.  Check the number of milligrams (mg) of salt (sodium) in one serving. Most people should limit total sodium intake to less than 2,300 mg per day.  Always check the nutrition information of foods labeled as "low-fat" or "  nonfat". These foods may be higher in added sugar or refined carbs and should be avoided.  Talk to your dietitian to identify your daily goals for nutrients listed on the label. Shopping  Avoid buying canned, premade, or processed foods. These foods tend to be high in fat, sodium, and added sugar.  Shop around the outside edge of the grocery store. This includes fresh fruits and vegetables, bulk grains, fresh meats, and fresh dairy. Cooking  Use low-heat cooking methods, such as baking, instead of high-heat cooking methods like deep frying.  Brissette using healthy oils, such as olive, canola, or sunflower oil.  Avoid cooking with butter, cream, or high-fat meats. Meal planning  Eat meals and snacks regularly, preferably at the same times every day. Avoid going long periods of  time without eating.  Eat foods high in fiber, such as fresh fruits, vegetables, beans, and whole grains. Talk to your dietitian about how many servings of carbs you can eat at each meal.  Eat 4-6 ounces (oz) of lean protein each day, such as lean meat, chicken, fish, eggs, or tofu. One oz of lean protein is equal to: ? 1 oz of meat, chicken, or fish. ? 1 egg. ?  cup of tofu.  Eat some foods each day that contain healthy fats, such as avocado, nuts, seeds, and fish. Lifestyle  Check your blood glucose regularly.  Exercise regularly as told by your health care provider. This may include: ? 150 minutes of moderate-intensity or vigorous-intensity exercise each week. This could be brisk walking, biking, or water aerobics. ? Stretching and doing strength exercises, such as yoga or weightlifting, at least 2 times a week.  Take medicines as told by your health care provider.  Do not use any products that contain nicotine or tobacco, such as cigarettes and e-cigarettes. If you need help quitting, ask your health care provider.  Work with a Social worker or diabetes educator to identify strategies to manage stress and any emotional and social challenges. Questions to ask a health care provider  Do I need to meet with a diabetes educator?  Do I need to meet with a dietitian?  What number can I call if I have questions?  When are the best times to check my blood glucose? Where to find more information:  American Diabetes Association: diabetes.org  Academy of Nutrition and Dietetics: www.eatright.[employer] of Diabetes and Digestive and Kidney Diseases (NIH): [employer] Summary  A healthy meal plan will help you control your blood glucose and maintain a healthy lifestyle.  Working with a diet and nutrition specialist (dietitian) can help you make a meal plan that is best for you.  Keep in mind that carbohydrates (carbs) and alcohol have immediate effects on your  blood glucose levels. It is important to count carbs and to use alcohol carefully. This information is not intended to replace advice given to you by your health care provider. Make sure you discuss any questions you have with your health care provider. Document Revised: 05/26/2017 Document Reviewed: 07/18/2016 Elsevier Patient Education  2020 [employer].

## 2020-03-30 ENCOUNTER — Other Ambulatory Visit: Payer: Self-pay | Admitting: Internal Medicine

## 2020-04-06 ENCOUNTER — Encounter: Payer: Self-pay | Admitting: Nurse Practitioner

## 2020-04-06 ENCOUNTER — Ambulatory Visit (INDEPENDENT_AMBULATORY_CARE_PROVIDER_SITE_OTHER): Payer: HMO | Admitting: Nurse Practitioner

## 2020-04-06 ENCOUNTER — Telehealth: Payer: Self-pay | Admitting: Pharmacist

## 2020-04-06 ENCOUNTER — Other Ambulatory Visit: Payer: Self-pay | Admitting: Internal Medicine

## 2020-04-06 VITALS — BP 120/72 | HR 75 | Ht 62.0 in | Wt 163.0 lb

## 2020-04-06 DIAGNOSIS — R140 Abdominal distension (gaseous): Secondary | ICD-10-CM

## 2020-04-06 DIAGNOSIS — Z86010 Personal history of colonic polyps: Secondary | ICD-10-CM | POA: Diagnosis not present

## 2020-04-06 DIAGNOSIS — K5900 Constipation, unspecified: Secondary | ICD-10-CM

## 2020-04-06 NOTE — Progress Notes (Signed)
Chronic Care Management Pharmacy Assistant   Name: Roan Sawchuk  MRN: [id number] DOB: May 19, 1941  Reason for Encounter: Medication Review  Patient Questions:  1.  Have you seen any other providers since your last visit? No  2.  Any changes in your medicines or health? No    PCP : Binnie Rail, MD  Allergies:   Allergies  Allergen Reactions  . Cymbalta [Duloxetine Hcl] Hives and Itching  . Effexor [Venlafaxine] Nausea Only  . Other Other (See Comments)    "SEEDED" food due to stomach issues  . Statins Nausea And Vomiting  . Penicillins Hives and Rash    Has patient had a PCN reaction causing immediate rash, facial/tongue/throat swelling, SOB or lightheadedness with hypotension: Yes Has patient had a PCN reaction causing severe rash involving mucus membranes or skin necrosis: No Has patient had a PCN reaction that required hospitalization: No Has patient had a PCN reaction occurring within the last 10 years: Yes If all of the above answers are "NO", then may proceed with Cephalosporin use.     Medications: Outpatient Encounter Medications as of 04/06/2020  Medication Sig Note  . acetaminophen (TYLENOL) 500 MG tablet Take 500 mg by mouth every 6 (six) hours as needed for headache (pain).   Marland Kitchen albuterol (PROAIR HFA) 108 (90 BASE) MCG/ACT inhaler Inhale 2 puffs into the lungs every 6 (six) hours as needed for wheezing or shortness of breath.    . Alpha-D-Galactosidase (BEANO PO) Take 1-2 tablets by mouth daily as needed (for gas).   Marland Kitchen aspirin EC 81 MG tablet Take 1 tablet (81 mg total) by mouth daily.   Marland Kitchen azelastine (ASTELIN) 0.1 % nasal spray Place 2 sprays into both nostrils 2 (two) times daily as needed for rhinitis. Use in each nostril as directed   . blood glucose meter kit and supplies KIT Dispense based on patient and insurance preference. Use up to four times daily as directed. (FOR ICD-10: E11.9).   . clonazePAM (KLONOPIN) 0.5 MG tablet TAKE 1/2 TO 1 TABLET  TWICE A DAY AS NEEDED FOR ANXIETY (Patient taking differently: Take 0.5 mg by mouth 2 (two) times daily as needed for anxiety. )   . ezetimibe (ZETIA) 10 MG tablet Take 1 tablet (10 mg total) by mouth daily.   Marland Kitchen glucose blood (ONETOUCH ULTRA) test strip USE UP TO 4 TIMES DAILY AS DIRECTED   . JARDIANCE 10 MG TABS tablet TAKE 1 TABLET BY MOUTH EVERY DAY   . Lancets MISC Use to check blood sugars up to 4 times daily as directed. E11.9   . linaclotide (LINZESS) 145 MCG CAPS capsule Take 1 capsule (145 mcg total) by mouth daily as needed (constipation).   . lubiprostone ([name]) 24 MCG capsule TAKE 1 CAPSULE (24 MCG TOTAL) BY MOUTH 2 (TWO) TIMES DAILY WITH A MEAL.   Marland Kitchen meclizine (ANTIVERT) 12.5 MG tablet TAKE 1 TABLET (12.5 MG TOTAL) BY MOUTH 3 (THREE) TIMES DAILY AS NEEDED FOR DIZZINESS.   . Multiple Vitamin (MULTIVITAMIN WITH MINERALS) TABS tablet Take 1 tablet by mouth daily after lunch.   . nitroGLYCERIN (NITROSTAT) 0.4 MG SL tablet Place 1 tablet (0.4 mg total) under the tongue every 5 (five) minutes as needed for chest pain. 01/05/2020: 2 tablets on 7/2 and 2 tablets on 7/8  . omeprazole (PRILOSEC) 20 MG capsule TAKE 1 CAPSULE BY MOUTH EVERY DAY (Patient taking differently: Take 20 mg by mouth daily as needed (acid reflux/heartburn). )   . OVER THE  COUNTER MEDICATION Place 1 drop into both eyes 2 (two) times daily as needed (dry eyes). Over the counter eye drop for itching   . OXcarbazepine (TRILEPTAL) 150 MG tablet TAKE 1 TABLET BY MOUTH TWICE A DAY (Patient taking differently: Take 150 mg by mouth 2 (two) times daily. )   . Polyvinyl Alcohol-Povidone (REFRESH OP) Place 1 drop into both eyes daily as needed (dry eyes).   . pregabalin (LYRICA) 75 MG capsule TAKE ONE CAPSULE BY MOUTH THREE TIMES DAILY   . Probiotic Product (PROBIOTIC PO) Take 1 capsule by mouth daily after breakfast.    . rosuvastatin (CRESTOR) 5 MG tablet TAKE 1 TABLET BY MOUTH EVERY DAY   . Semaglutide,0.25 or 0.5MG/DOS, (OZEMPIC,  0.25 OR 0.5 MG/DOSE,) 2 MG/1.5ML SOPN Inject 0.375 mLs (0.5 mg total) into the skin once a week. 03/10/2020: Via [facility] P[facility] through 06/26/2020  . sennosides-docusate sodium (SENOKOT-S) 8.6-50 MG tablet Take 1-2 tablets by mouth daily as needed for constipation.   . [DISCONTINUED] Calcium Carbonate (CALCIUM 500 PO) Take 1 capsule by mouth every other day.     No facility-administered encounter medications on file as of 04/06/2020.    Current Diagnosis: Patient Active Problem List   Diagnosis Date Noted  . Change in stool 01/23/2020  . Hypokalemia 01/06/2020  . Unstable angina ([place]) 01/05/2020  . CAD (coronary artery disease) 05/28/2018  . Neck pain 07/21/2017  . Dysuria 04/10/2017  . Decreased hearing of right ear 02/01/2017  . Primary osteoarthritis of left knee 01/12/2017  . Constipation 01/10/2017  . Hypercalcemia 10/12/2016  . Chest pain 10/12/2016  . DOE (dyspnea on exertion) 10/12/2016  . Chronic back pain 08/23/2016  . Hair loss 08/23/2016  . Diabetes ([place]) 07/26/2016  . Anxiety 09/14/2015  . Diverticulitis large intestine w/o perforation or abscess w/o bleeding 05/12/2014  . Essential hypertension 05/12/2014  . Diabetic neuropathy ([place]) 05/12/2014  . Seizure disorder ([place]) 05/12/2014  . Hepatic steatosis 05/12/2014  . Cerebrovascular disease, unspecified 03/13/2013  . Allergic rhinitis 03/13/2013  . GOITER, MULTINODULAR 05/18/2009  . VERTIGO 04/13/2009  . Irritable bowel syndrome 03/20/2009  . Dyslipidemia 03/18/2009  . GERD 03/18/2009    Goals Addressed   None     Follow-Up:  Pharmacist Review   Reviewed chart for medication changes ahead of medication coordination call.  No OVs, Consults, or hospital visits since last care coordination call/Pharmacist visit. (If appropriate, list visit date, provider name)  No medication changes indicated OR if recent visit, treatment plan here.  BP Readings from Last 3 Encounters:  04/06/20 120/72  02/20/20 110/60    01/23/20 110/68    Lab Results  Component Value Date   HGBA1C 6.3 (H) 01/23/2020     Patient obtains medications through Vials  90 Days   Last adherence delivery included:   Pregabalin 75 mg; 1 cap tid Omeprazole 20 mg; take 1 cap daily Rosuvastatin 5 mg; take 1 tab at bedtime Ezetimibe 10 mg; take 1 tab daily Onetouch test ultra ; up to four time daily onetouch delica plus lancets;  ozempic inj 2/1.68m; inject once a week Hydrochlorothiazide take 1 tab daily potassiumcl er 20  Meq; take 1 tab tid oxcarbazepin 150 mg; take 1 tab in the morning and 1 tab    Patient declined   Pregabalin 75 mg; 1 cap tid Omeprazole 20 mg; take 1 cap daily Rosuvastatin 5 mg; take 1 tab at bedtime Ezetimibe 10 mg; take 1 tab daily Onetouch test ultra ; up to four time  daily onetouch delica plus lancets;  ozempic inj 2/1.46m; inject once a week Hydrochlorothiazide take 1 tab daily potassiumcl er 20  Meq; take 1 tab tid oxcarbazepin 150 mg; take 1 tab in the morning and 1 tab   Patient received medications in September and next delivery is 05/16/20 Patient is due for next adherence delivery on 04/15/20 Called patient and reviewed medications and coordinated delivery.    Patient needs refills for rosuvastatin 5 mg    DRosendo Gros NKratzervilleTeam Manager/ CPA (Clinical Pharmacist Assistant) [phone]

## 2020-04-06 NOTE — Progress Notes (Signed)
Reviewed and agree with management plan.  Freddrick Gladson T. Cuma Polyakov, MD FACG [facility]  

## 2020-04-06 NOTE — Progress Notes (Signed)
A user error has taken place: encounter opened in error, closed for administrative reasons.

## 2020-04-06 NOTE — Progress Notes (Signed)
04/06/2020 [name] [id number] 27-Apr-1941   CHIEF COMPLAINT: Abdominal bloat   HISTORY OF PRESENT ILLNESS:  [name] is a 79 year old female with a past medical history of anxiety,  hypertension, coronary artery disease, DM II, neuropathy, OSA on cpap as needed, seizure disorder (last  seizure occurred in 2015), arthritis, GERD and colon polyps. Past cholecystectomy, appendectomy and hysterectomy. She ws referred to our office by Dr. Quay Coffey regarding change in bowel pattern, constipation. She reports having IBS symptoms. She complains of having a lot of gas per the rectum and abdominal bloat  the past year. She goes to the bathroom and passes a lot of gas. She has constipation. If she goes more than 3 days without a BM she takes Senokot 8.6 mg one tab as needed which results in passing a moderate amount of stool the next morning. She tried Linzess 150mg QD which resulted in excessive diarrhea and she does not wish to try the lower 762m dose. [name] was ineffective. She is taking a probiotic once daily. She reduced her ice cream intake. She uses soy milk. She underwent colonoscopy 05/07/2009 which identified 2 hyperplastic polyps which were removed from the colon and mild sigmoid diverticulosis to the sigmoid colon. A colonoscopy was recommended in 10 years which was not done. She has a history of GERD symptoms which are well controlled on Omeprazole 2080mD. No dysphagia or heartburn. No weight loss.   LHC 01/06/20  Calcified left main, LAD, and proximal circumflex.  Widely patent left main  Widely patent LAD  Large branching ramus intermedius with ostial 75% narrowing in the more anterior subbranch, unchanged from 2019  Tortuous circumflex with ostial 65 to 75% stenosis, unchanged from prior. The ostial vessel was calcified.  Anterior origin right coronary. Widely patent without obstructive disease.  Hyperdynamic left ventricular systolic function with EF greater  than 65%. EDP is normal  2D Echo 7/12/21IMPRESSIONS 1. Left ventricular ejection fraction, by estimation, is 60 to 65%. The  left ventricle has normal function. The left ventricle has no regional  wall motion abnormalities. There is mild left ventricular hypertrophy.  Left ventricular diastolic parameters  are consistent with Grade I diastolic dysfunction (impaired relaxation).  2. Right ventricular systolic function is normal   CBC Latest Ref Rng & Units 01/06/2020 01/05/2020 02/06/2019  WBC 4.0 - 10.5 K/uL 7.3 6.2 7.5  Hemoglobin 12.0 - 15.0 g/dL 13.9 14.3 13.9  Hematocrit 36 - 46 % 41.8 44.1 41.3  Platelets 150 - 400 K/uL 234 248 270.0    CMP Latest Ref Rng & Units 01/23/2020 01/06/2020 01/05/2020  Glucose 65 - 99 mg/dL 84 126(H) 92  BUN 7 - 25 mg/dL _0 Creatinine 0.60 - 0.93 mg/dL 0.76 0.77 0.87  Sodium 135 - 146 mmol/L 140 137 137  Potassium 3.5 - 5.3 mmol/L 3.6 3.1(L) 3.5  Chloride 98 - 110 mmol/L 100 99 99  CO2 20 - 32 mmol/L _1 Calcium 8.6 - 10.4 mg/dL 10.3 9.7 9.9  Total Protein 6.0 - 8.3 g/dL - - -  Total Bilirubin 0.2 - 1.2 mg/dL - - -  Alkaline Phos 39 - 117 U/L - - -  AST 0 - 37 U/L - - -  ALT 0 - 35 U/L - - -    Past Medical History:  Diagnosis Date  . Allergic rhinitis, cause unspecified 03/13/2013  . Anxiety   . Cerebrovascular disease, unspecified 03/13/2013   Atrophy and small  vessel dz noted, MR brain 2009  . Depression   . Diabetes mellitus, type 2 ([place])   . Diverticulosis of colon 03/2010 hosp  . Dyslipidemia   . GERD (gastroesophageal reflux disease)   . Hypertension   . Neuropathy   . OSA on CPAP   . Osteoarthritis of shoulder region    and Knee  . Seizure disorder ([place])    onset 11 years ago; repeated 2013   Past Surgical History:  Procedure Laterality Date  . ABDOMINAL HYSTERECTOMY  1970's   Partial  . APPENDECTOMY    . CHOLECYSTECTOMY    . LEFT HEART CATH AND CORONARY ANGIOGRAPHY N/A 05/28/2018   Procedure: LEFT HEART CATH  AND CORONARY ANGIOGRAPHY;  Surgeon: Belva Crome, MD;  Location: [facility];  Service: Cardiovascular;  Laterality: N/A;  . LEFT HEART CATH AND CORONARY ANGIOGRAPHY N/A 01/06/2020   Procedure: LEFT HEART CATH AND CORONARY ANGIOGRAPHY;  Surgeon: Belva Crome, MD;  Location: [facility];  Service: Cardiovascular;  Laterality: N/A;  . LUMBAR EPIDURAL INJECTION Left 08/25/2017  . SHOULDER SURGERY  2008   LT, post fall   . TONSILLECTOMY AND ADENOIDECTOMY    . TOTAL KNEE ARTHROPLASTY Left 01/12/2017   Procedure: LEFT TOTAL KNEE ARTHROPLASTY;  Surgeon: Susa Day, MD;  Location: [facility];  Service: Orthopedics;  Laterality: Left;  120 mins   Social History: Widowed. She has one son and one daughter. Quit smoking cigarettes 40 years ago. She drinks one glass of red wine if she goes out with friends or family.   Family History:  Mother died age 44 with history of arthritis.  Father died from MI 34.    Allergies  Allergen Reactions  . Cymbalta [Duloxetine Hcl] Hives and Itching  . Effexor [Venlafaxine] Nausea Only  . Other Other (See Comments)    "SEEDED" food due to stomach issues  . Statins Nausea And Vomiting  . Penicillins Hives and Rash    Has patient had a PCN reaction causing immediate rash, facial/tongue/throat swelling, SOB or lightheadedness with hypotension: Yes Has patient had a PCN reaction causing severe rash involving mucus membranes or skin necrosis: No Has patient had a PCN reaction that required hospitalization: No Has patient had a PCN reaction occurring within the last 10 years: Yes If all of the above answers are "NO", then may proceed with Cephalosporin use.       Outpatient Encounter Medications as of 04/06/2020  Medication Sig  . acetaminophen (TYLENOL) 500 MG tablet Take 500 mg by mouth every 6 (six) hours as needed for headache (pain).  Marland Kitchen albuterol (PROAIR HFA) 108 (90 BASE) MCG/ACT inhaler Inhale 2 puffs into the lungs every 6 (six) hours as  needed for wheezing or shortness of breath.   . Alpha-D-Galactosidase (BEANO PO) Take 1-2 tablets by mouth daily as needed (for gas).  Marland Kitchen aspirin EC 81 MG tablet Take 1 tablet (81 mg total) by mouth daily.  Marland Kitchen azelastine (ASTELIN) 0.1 % nasal spray Place 2 sprays into both nostrils 2 (two) times daily as needed for rhinitis. Use in each nostril as directed  . blood glucose meter kit and supplies KIT Dispense based on patient and insurance preference. Use up to four times daily as directed. (FOR ICD-10: E11.9).  . clonazePAM (KLONOPIN) 0.5 MG tablet TAKE 1/2 TO 1 TABLET TWICE A DAY AS NEEDED FOR ANXIETY (Patient taking differently: Take 0.5 mg by mouth 2 (two) times daily as needed for anxiety. )  . ezetimibe (  ZETIA) 10 MG tablet Take 1 tablet (10 mg total) by mouth daily.  Marland Kitchen glucose blood (ONETOUCH ULTRA) test strip USE UP TO 4 TIMES DAILY AS DIRECTED  . JARDIANCE 10 MG TABS tablet TAKE 1 TABLET BY MOUTH EVERY DAY  . Lancets MISC Use to check blood sugars up to 4 times daily as directed. E11.9  . lubiprostone ([name]) 24 MCG capsule TAKE 1 CAPSULE (24 MCG TOTAL) BY MOUTH 2 (TWO) TIMES DAILY WITH A MEAL.  Marland Kitchen meclizine (ANTIVERT) 12.5 MG tablet TAKE 1 TABLET (12.5 MG TOTAL) BY MOUTH 3 (THREE) TIMES DAILY AS NEEDED FOR DIZZINESS.  . Multiple Vitamin (MULTIVITAMIN WITH MINERALS) TABS tablet Take 1 tablet by mouth daily after lunch.  . nitroGLYCERIN (NITROSTAT) 0.4 MG SL tablet Place 1 tablet (0.4 mg total) under the tongue every 5 (five) minutes as needed for chest pain.  Marland Kitchen omeprazole (PRILOSEC) 20 MG capsule TAKE 1 CAPSULE BY MOUTH EVERY DAY (Patient taking differently: Take 20 mg by mouth daily as needed (acid reflux/heartburn). )  . OVER THE COUNTER MEDICATION Place 1 drop into both eyes 2 (two) times daily as needed (dry eyes). Over the counter eye drop for itching  . OXcarbazepine (TRILEPTAL) 150 MG tablet TAKE 1 TABLET BY MOUTH TWICE A DAY (Patient taking differently: Take 150 mg by mouth 2 (two)  times daily. )  . Polyvinyl Alcohol-Povidone (REFRESH OP) Place 1 drop into both eyes daily as needed (dry eyes).  . pregabalin (LYRICA) 75 MG capsule TAKE ONE CAPSULE BY MOUTH THREE TIMES DAILY  . Probiotic Product (PROBIOTIC PO) Take 1 capsule by mouth daily after breakfast.   . rosuvastatin (CRESTOR) 5 MG tablet TAKE 1 TABLET BY MOUTH EVERY DAY  . Semaglutide,0.25 or 0.5MG/DOS, (OZEMPIC, 0.25 OR 0.5 MG/DOSE,) 2 MG/1.5ML SOPN Inject 0.375 mLs (0.5 mg total) into the skin once a week.  . sennosides-docusate sodium (SENOKOT-S) 8.6-50 MG tablet Take 1-2 tablets by mouth daily as needed for constipation.  . [DISCONTINUED] linaclotide (LINZESS) 145 MCG CAPS capsule Take 1 capsule (145 mcg total) by mouth daily as needed (constipation).  . [DISCONTINUED] Calcium Carbonate (CALCIUM 500 PO) Take 1 capsule by mouth every other day.    No facility-administered encounter medications on file as of 04/06/2020.    REVIEW OF SYSTEMS:  Gen: + fatigue. Denies fever, sweats or chills. No weight loss.  CV: Denies chest pain, palpitations or edema. Resp: + cough. No shortness of breath of hemoptysis.  GI: See HPI.  GU : Denies urinary burning, blood in urine, increased urinary frequency or incontinence. MS: + arthritis, back pain.  Derm: Denies rash, itchiness, skin lesions or unhealing ulcers. Psych: + anxiety.  Heme: Denies bruising, bleeding. Neuro:  Denies headaches, dizziness or paresthesias. Endo:  + excessive thirst.   PHYSICAL EXAM: BP 120/72   Pulse 75   Ht _0  (1.575 m)   Wt 163 lb (73.9 kg)   BMI 29.81 kg/m   Wt Readings from Last 3 Encounters:  04/06/20 163 lb (73.9 kg)  02/20/20 159 lb 12.8 oz (72.5 kg)  01/23/20 162 lb (73.5 kg)    General: Well developed 79 year old female in no acute distress. Head: Normocephalic and atraumatic. Eyes:  Sclerae non-icteric, conjunctive pink. Ears: Hard of hearing.  Mouth: Dentition intact. No ulcers or lesions.  Neck: Supple, no  lymphadenopathy or thyromegaly.  Lungs: Clear bilaterally to auscultation without wheezes, crackles or rhonchi. Heart: Regular rate and rhythm. No murmur, rub or gallop appreciated.  Abdomen: Soft, non  distended. Mild right mid abdominal tenderness (patient reports is chronic). No masses. No hepatosplenomegaly. Normoactive bowel sounds x 4 quadrants. Large RUQ scar intact.  Rectal: Deferred.  Musculoskeletal: Symmetrical with no gross deformities. Skin: Warm and dry. No rash or lesions on visible extremities. Extremities: No edema. Neurological: Alert oriented x 4, no focal deficits.  Psychological:  Alert and cooperative. Normal mood and affect.  ASSESSMENT AND PLAN:  12. 79 year old female with abdominal bloat and constipation -Miralax Q HS -Senokot 1 tab  Q third night -Continue probiotic of choice once daily -Ibgard 1 po bid  -Consider abd/pelvic CT if no improvement, to discuss further at the time of her 2 month follow up appt with Dr. Fuller Plan -Patient to call our office if her symptoms worsen  -Maintain a lactose free diet   2. History of hyperplastic colon polyps -Patient does not wish to pursue a colonoscopy at this time. However, if her above abd bloat/constipation symptoms do not improve she will consider scheduling a colonoscopy at the time of her follow up appointment in 2 months.   3. History of GERD symptoms, controlled on Omeprazole 32m daily  4. History of CAD/coronary calcifications   5. DM II       CC:  Burns, SClaudina Lick MD

## 2020-04-06 NOTE — Patient Instructions (Addendum)
If you are age 77 or older, your body mass index should be between 23-30. Your Body mass index is 29.81 kg/m. If this is out of the aforementioned range listed, please consider follow up with your Primary Care Provider.  If you are age 88 or younger, your body mass index should be between 19-25. Your Body mass index is 29.81 kg/m. If this is out of the aformentioned range listed, please consider follow up with your Primary Care Provider.   START Miralax 17 grams in 8 ounces of water or juice every night.  START Senokot 1 tablet every 3 nights as needed.  START IBguard 1 capsule twice daily as needed for abd bloat.  You have been scheduled to follow up with Dr. Fuller Plan on June 09, 2020 at 11:15 am.

## 2020-04-28 ENCOUNTER — Other Ambulatory Visit: Payer: Self-pay | Admitting: Internal Medicine

## 2020-04-28 ENCOUNTER — Telehealth: Payer: Self-pay | Admitting: Pharmacist

## 2020-04-28 NOTE — Progress Notes (Signed)
A user error has taken place: encounter opened in error, closed for administrative reasons.

## 2020-05-07 ENCOUNTER — Other Ambulatory Visit: Payer: Self-pay | Admitting: Neurology

## 2020-05-07 ENCOUNTER — Other Ambulatory Visit: Payer: Self-pay | Admitting: Internal Medicine

## 2020-05-11 ENCOUNTER — Telehealth: Payer: Self-pay | Admitting: Pharmacist

## 2020-05-11 NOTE — Progress Notes (Signed)
Chronic Care Management Pharmacy Assistant   Name: Jasleen Riepe  MRN: [id number] DOB: 1940-09-01  Reason for Encounter: Medication Review  Patient Questions:  1.  Have you seen any other providers since your last visit? Yes, patient last seen Carl Best NP Gastro on 04/06/2020  2.  Any changes in your medicines or health? Yes, Georgina Peer NP discontinued the Linaclotide   PCP : Binnie Rail, MD  Allergies:   Allergies  Allergen Reactions  . Cymbalta [Duloxetine Hcl] Hives and Itching  . Effexor [Venlafaxine] Nausea Only  . Other Other (See Comments)    "SEEDED" food due to stomach issues  . Statins Nausea And Vomiting  . Penicillins Hives and Rash    Has patient had a PCN reaction causing immediate rash, facial/tongue/throat swelling, SOB or lightheadedness with hypotension: Yes Has patient had a PCN reaction causing severe rash involving mucus membranes or skin necrosis: No Has patient had a PCN reaction that required hospitalization: No Has patient had a PCN reaction occurring within the last 10 years: Yes If all of the above answers are "NO", then may proceed with Cephalosporin use.     Medications: Outpatient Encounter Medications as of 05/11/2020  Medication Sig Note  . acetaminophen (TYLENOL) 500 MG tablet Take 500 mg by mouth every 6 (six) hours as needed for headache (pain).   Marland Kitchen albuterol (PROAIR HFA) 108 (90 BASE) MCG/ACT inhaler Inhale 2 puffs into the lungs every 6 (six) hours as needed for wheezing or shortness of breath.    . Alpha-D-Galactosidase (BEANO PO) Take 1-2 tablets by mouth daily as needed (for gas).   Marland Kitchen aspirin EC 81 MG tablet Take 1 tablet (81 mg total) by mouth daily.   Marland Kitchen azelastine (ASTELIN) 0.1 % nasal spray Place 2 sprays into both nostrils 2 (two) times daily as needed for rhinitis. Use in each nostril as directed   . blood glucose meter kit and supplies KIT Dispense based on patient and insurance preference. Use up to  four times daily as directed. (FOR ICD-10: E11.9).   . clonazePAM (KLONOPIN) 0.5 MG tablet TAKE 1/2 TO 1 TABLET TWICE A DAY AS NEEDED FOR ANXIETY (Patient taking differently: Take 0.5 mg by mouth 2 (two) times daily as needed for anxiety. )   . ezetimibe (ZETIA) 10 MG tablet TAKE ONE TABLET BY MOUTH ONCE DAILY   . glucose blood (ONETOUCH ULTRA) test strip USE UP TO 4 TIMES DAILY AS DIRECTED   . JARDIANCE 10 MG TABS tablet TAKE 1 TABLET BY MOUTH EVERY DAY   . Lancets MISC Use to check blood sugars up to 4 times daily as directed. E11.9   . lubiprostone ([name]) 24 MCG capsule TAKE 1 CAPSULE (24 MCG TOTAL) BY MOUTH 2 (TWO) TIMES DAILY WITH A MEAL.   Marland Kitchen meclizine (ANTIVERT) 12.5 MG tablet TAKE 1 TABLET (12.5 MG TOTAL) BY MOUTH 3 (THREE) TIMES DAILY AS NEEDED FOR DIZZINESS.   . Multiple Vitamin (MULTIVITAMIN WITH MINERALS) TABS tablet Take 1 tablet by mouth daily after lunch.   . nitroGLYCERIN (NITROSTAT) 0.4 MG SL tablet Place 1 tablet (0.4 mg total) under the tongue every 5 (five) minutes as needed for chest pain. 01/05/2020: 2 tablets on 7/2 and 2 tablets on 7/8  . omeprazole (PRILOSEC) 20 MG capsule TAKE 1 CAPSULE BY MOUTH EVERY DAY (Patient taking differently: Take 20 mg by mouth daily as needed (acid reflux/heartburn). )   . OVER THE COUNTER MEDICATION Place 1 drop into both eyes 2 (  two) times daily as needed (dry eyes). Over the counter eye drop for itching   . OXcarbazepine (TRILEPTAL) 150 MG tablet TAKE 1 TABLET BY MOUTH TWICE A DAY (Patient taking differently: Take 150 mg by mouth 2 (two) times daily. )   . OZEMPIC, 0.25 OR 0.5 MG/DOSE, 2 MG/1.5ML SOPN Inject 0.375 mLs (0.5 mg total) into the skin once a week.   . Polyvinyl Alcohol-Povidone (REFRESH OP) Place 1 drop into both eyes daily as needed (dry eyes).   . pregabalin (LYRICA) 75 MG capsule TAKE ONE CAPSULE BY MOUTH THREE TIMES DAILY   . Probiotic Product (PROBIOTIC PO) Take 1 capsule by mouth daily after breakfast.    . rosuvastatin  (CRESTOR) 5 MG tablet TAKE 1 TABLET BY MOUTH EVERY DAY   . sennosides-docusate sodium (SENOKOT-S) 8.6-50 MG tablet Take 1-2 tablets by mouth daily as needed for constipation.   . [DISCONTINUED] Calcium Carbonate (CALCIUM 500 PO) Take 1 capsule by mouth every other day.     No facility-administered encounter medications on file as of 05/11/2020.    Current Diagnosis: Patient Active Problem List   Diagnosis Date Noted  . Change in stool 01/23/2020  . Hypokalemia 01/06/2020  . Unstable angina ([place]) 01/05/2020  . CAD (coronary artery disease) 05/28/2018  . Neck pain 07/21/2017  . Dysuria 04/10/2017  . Decreased hearing of right ear 02/01/2017  . Primary osteoarthritis of left knee 01/12/2017  . Constipation 01/10/2017  . Hypercalcemia 10/12/2016  . Chest pain 10/12/2016  . DOE (dyspnea on exertion) 10/12/2016  . Chronic back pain 08/23/2016  . Hair loss 08/23/2016  . Diabetes ([place]) 07/26/2016  . Anxiety 09/14/2015  . Diverticulitis large intestine w/o perforation or abscess w/o bleeding 05/12/2014  . Essential hypertension 05/12/2014  . Diabetic neuropathy ([place]) 05/12/2014  . Seizure disorder ([place]) 05/12/2014  . Hepatic steatosis 05/12/2014  . Cerebrovascular disease, unspecified 03/13/2013  . Allergic rhinitis 03/13/2013  . GOITER, MULTINODULAR 05/18/2009  . VERTIGO 04/13/2009  . Irritable bowel syndrome 03/20/2009  . Dyslipidemia 03/18/2009  . GERD 03/18/2009    Goals Addressed   None     Follow-Up:  Coordination of Enhanced Pharmacy Services   Reviewed chart for medication changes ahead of medication coordination call.  No OVs, Consults, or hospital visits since last care coordination call/Pharmacist visit. No medication changes indicated   BP Readings from Last 3 Encounters:  04/06/20 120/72  02/20/20 110/60  01/23/20 110/68    Lab Results  Component Value Date   HGBA1C 6.3 (H) 01/23/2020     Patient obtains medications through Adherence Packaging  90  Days   Last adherence delivery included:  Ezetimibe 10 mg 1 tab daily Pregabalin 75 mg 1 cap three times daily Omeprazole 20 mg 1 cap every morning  Rosuvastatin 5 mg 1 tab daily at bedtime Oxcarbazepine 150 mg 1 tab every morning and bedtime    Patient declined  Ezetimibe 10 mg 1 tab daily Pregabalin 75 mg 1 cap three times daily Omeprazole 20 mg 1 cap every morning  Rosuvastatin 5 mg 1 tab daily at bedtime Oxcarbazepine 150 mg 1 tab every morning and bedtime   last month due to PRN use/additional supply on hand. Explanation of abundance on hand (ie #30 due to overlapping fills or previous adherence issues etc)  Patient is due for next adherence delivery on: 05/20/2020. Called patient and reviewed medications and coordinated delivery.  This delivery to include: Ezetimibe 10 mg 1 tab daily Pregabalin 75 mg 1 cap three times daily  Omeprazole 20 mg 1 cap every morning  Rosuvastatin 5 mg 1 tab daily at bedtime Oxcarbazepine 150 mg 1 tab every morning and bedtime   Confirmed delivery date of 05/20/2020, advised patient that pharmacy will contact them the morning of delivery.   Wendy Poet, Clinical Pharmacist Assistant [facility]

## 2020-05-18 ENCOUNTER — Other Ambulatory Visit: Payer: Self-pay | Admitting: Internal Medicine

## 2020-05-26 ENCOUNTER — Telehealth: Payer: Self-pay

## 2020-05-26 NOTE — Telephone Encounter (Signed)
Left message for patient today that refills had been sent to the office. Placed in fridge on my side today. Name and date written on bag.

## 2020-05-26 NOTE — Telephone Encounter (Signed)
Shipment of 2 boxes of Ozempic received in office today. 2 1x1.5 ML prefilled pens.

## 2020-06-01 ENCOUNTER — Other Ambulatory Visit: Payer: Self-pay | Admitting: Internal Medicine

## 2020-06-02 ENCOUNTER — Ambulatory Visit: Payer: HMO | Admitting: Pharmacist

## 2020-06-02 ENCOUNTER — Other Ambulatory Visit: Payer: Self-pay

## 2020-06-02 DIAGNOSIS — E1142 Type 2 diabetes mellitus with diabetic polyneuropathy: Secondary | ICD-10-CM

## 2020-06-02 DIAGNOSIS — I2510 Atherosclerotic heart disease of native coronary artery without angina pectoris: Secondary | ICD-10-CM

## 2020-06-02 DIAGNOSIS — I10 Essential (primary) hypertension: Secondary | ICD-10-CM

## 2020-06-02 DIAGNOSIS — K5900 Constipation, unspecified: Secondary | ICD-10-CM

## 2020-06-02 DIAGNOSIS — E782 Mixed hyperlipidemia: Secondary | ICD-10-CM

## 2020-06-02 NOTE — Chronic Care Management (AMB) (Signed)
Chronic Care Management Pharmacy  Name: [name]  MRN: [id number] DOB: [birth year]  Chief Complaint/ HPI  [name],  79 y.o. , female presents for their Follow-Up CCM visit with the clinical pharmacist via telephone due to COVID-19 Pandemic.  PCP : Binnie Rail, MD  Their chronic conditions include: HTN, CAD, T2DM, HLD, GERD, IBS, seizure disorder, allergies, arthritis  Pt reports she takes care of herself, but does get tired doing household chores. He did have a fall in November, still has pain from that- R arm pain, does stretches with both arms.    Office Visits: 01/23/20 Dr Quay Burow OV: continue to hold HCTZ and potassium, monitor BP.  08/12/19 Dr Quay Burow Ok EdwardsRolan Lipa working better than amitiza, gave samples and sent refills. Rx'd meclizine for vertigo. Started omeprazole due to famotidine ineffective.  Consult Visit:  02/20/20 NP Cecilie Kicks (cardiology): reviewed CATH results from hospital, do not explain chest pain, images similar to 2019. Risk factor modification rec'd, no med changes.  02/04/20 [facility] - R shoulder injection given.  01/05/20 - 01/06/20 Hospital admission: chest pain, LHC 7/12, rec'd aggressive risk factor modification. CP likely musculoskeletal. Scheduled PPI. Hold HCTZ (and KCl) at discharge due to soft BP inpatient.  Allergies  Allergen Reactions  . Cymbalta [Duloxetine Hcl] Hives and Itching  . Effexor [Venlafaxine] Nausea Only  . Other Other (See Comments)    "SEEDED" food due to stomach issues  . Statins Nausea And Vomiting  . Penicillins Hives and Rash    Has patient had a PCN reaction causing immediate rash, facial/tongue/throat swelling, SOB or lightheadedness with hypotension: Yes Has patient had a PCN reaction causing severe rash involving mucus membranes or skin necrosis: No Has patient had a PCN reaction that required hospitalization: No Has patient had a PCN reaction occurring within the last 10 years: Yes If all  of the above answers are "NO", then may proceed with Cephalosporin use.    Medications: Outpatient Encounter Medications as of 06/02/2020  Medication Sig Note  . acetaminophen (TYLENOL) 500 MG tablet Take 500 mg by mouth every 6 (six) hours as needed for headache (pain).   Marland Kitchen albuterol (PROAIR HFA) 108 (90 BASE) MCG/ACT inhaler Inhale 2 puffs into the lungs every 6 (six) hours as needed for wheezing or shortness of breath.    . Alpha-D-Galactosidase (BEANO PO) Take 1-2 tablets by mouth daily as needed (for gas).   Marland Kitchen aspirin EC 81 MG tablet Take 1 tablet (81 mg total) by mouth daily.   Marland Kitchen azelastine (ASTELIN) 0.1 % nasal spray Place 2 sprays into both nostrils 2 (two) times daily as needed for rhinitis. Use in each nostril as directed   . blood glucose meter kit and supplies KIT Dispense based on patient and insurance preference. Use up to four times daily as directed. (FOR ICD-10: E11.9).   . clonazePAM (KLONOPIN) 0.5 MG tablet TAKE 1/2 TO 1 TABLET TWICE A DAY AS NEEDED FOR ANXIETY (Patient taking differently: Take 0.5 mg by mouth 2 (two) times daily as needed for anxiety. )   . ezetimibe (ZETIA) 10 MG tablet TAKE ONE TABLET BY MOUTH ONCE DAILY   . glucose blood (ONETOUCH ULTRA) test strip USE UP TO 4 TIMES DAILY AS DIRECTED   . JARDIANCE 10 MG TABS tablet TAKE 1 TABLET BY MOUTH EVERY DAY (Patient taking differently: Take 10 mg by mouth daily. ) 06/02/2020: [employer] PAP  . Lancets MISC Use to check blood sugars up to 4 times daily  as directed. E11.9   . linaclotide (LINZESS) 145 MCG CAPS capsule Take 145 mcg by mouth daily before breakfast. PRN - samples   . lubiprostone ([name]) 24 MCG capsule TAKE 1 CAPSULE (24 MCG TOTAL) BY MOUTH 2 (TWO) TIMES DAILY WITH A MEAL. 06/02/2020: [employer] PAP  . meclizine (ANTIVERT) 12.5 MG tablet TAKE 1 TABLET (12.5 MG TOTAL) BY MOUTH 3 (THREE) TIMES DAILY AS NEEDED FOR DIZZINESS.   . Multiple Vitamin (MULTIVITAMIN WITH MINERALS) TABS tablet Take 1 tablet by mouth daily  after lunch.   . nitroGLYCERIN (NITROSTAT) 0.4 MG SL tablet Place 1 tablet (0.4 mg total) under the tongue every 5 (five) minutes as needed for chest pain. 01/05/2020: 2 tablets on 7/2 and 2 tablets on 7/8  . omeprazole (PRILOSEC) 20 MG capsule TAKE ONE CAPSULE BY MOUTH EVERY MORNING   . OVER THE COUNTER MEDICATION Place 1 drop into both eyes 2 (two) times daily as needed (dry eyes). Over the counter eye drop for itching   . OXcarbazepine (TRILEPTAL) 150 MG tablet TAKE 1 TABLET BY MOUTH TWICE A DAY (Patient taking differently: Take 150 mg by mouth 2 (two) times daily. )   . OZEMPIC, 0.25 OR 0.5 MG/DOSE, 2 MG/1.5ML SOPN Inject 0.375 mLs (0.5 mg total) into the skin once a week. 06/02/2020: [employer] PAP  . Polyvinyl Alcohol-Povidone (REFRESH OP) Place 1 drop into both eyes daily as needed (dry eyes).   . pregabalin (LYRICA) 75 MG capsule TAKE 1 CAPSULE (75 MG TOTAL) BY MOUTH 3 (THREE) TIMES DAILY.   . Probiotic Product (PROBIOTIC PO) Take 1 capsule by mouth daily after breakfast.    . rosuvastatin (CRESTOR) 5 MG tablet TAKE 1 TABLET BY MOUTH EVERY DAY   . sennosides-docusate sodium (SENOKOT-S) 8.6-50 MG tablet Take 1-2 tablets by mouth daily as needed for constipation.   . [DISCONTINUED] Calcium Carbonate (CALCIUM 500 PO) Take 1 capsule by mouth every other day.     No facility-administered encounter medications on file as of 06/02/2020.   Wt Readings from Last 3 Encounters:  04/06/20 163 lb (73.9 kg)  02/20/20 159 lb 12.8 oz (72.5 kg)  01/23/20 162 lb (73.5 kg)    Current Diagnosis/Assessment:   Goals Addressed            This Visit's Progress   . Pharmacy Care Plan   On track    CARE PLAN ENTRY (see longitudinal plan of care for additional care plan information)  Current Barriers:  . Chronic Disease Management support, education, and care coordination needs related to Hypertension, Hyperlipidemia, Diabetes, and Coronary Artery Disease   Hypertension BP Readings from Last 3  Encounters:  02/20/20 110/60  01/23/20 110/68  01/06/20 (!) 109/55 .  Pharmacist Clinical Goal(s): o Over the next 30 days, patient will work with PharmD and providers to maintain BP goal <130/80 . Current regimen:  o No medications . Interventions: o Discussed BP goals and benefits of medications for prevention of heart attack / stroke . Patient self care activities - Over the next 30 days, patient will: o Check BP as needed, document, and provide at future appointments o Ensure daily salt intake < 2300 mg/day  Hyperlipidemia / CAD Lab Results  Component Value Date/Time   LDLCALC 46 08/12/2019 02:12 PM   LDLDIRECT 113.0 07/21/2017 04:43 PM .  Pharmacist Clinical Goal(s): o Over the next 30 days, patient will work with PharmD and providers to maintain LDL goal < 70 . Current regimen:  o rosuvastatin 5 mg daily o  ezetimibe 10 mg daily o aspirin 81 mg daily o nitroglycerin 0.4 mg as needed . Interventions: o Discussed cholesterol goals and benefits of medications for prevention of heart attack / stroke . Patient self care activities - Over the next 30 days, patient will: o Continue current medications  Diabetes Lab Results  Component Value Date/Time   HGBA1C 6.3 (H) 01/23/2020 11:11 AM   HGBA1C 6.9 (H) 08/12/2019 02:12 PM .  Pharmacist Clinical Goal(s): o Over the next 30 days, patient will work with PharmD and providers to maintain A1c goal <7% . Current regimen:  o Jardiance 10 mg daily (via [employer] - delivered to patient) o Ozempic 0.5 mg weekly (via [employer] - delivered to office) . Interventions: o Discussed blood sugar goals and benefits of medications for prevention of diabetic complications . Patient self care activities - Over the next 30 days, patient will: o Check blood sugar twice daily, document, and provide at future appointments o Contact provider with any episodes of hypoglycemia o Contact [employer] for refills of Jardiance.  o Contact office for refills  of Ozempic via [employer]  Medication management . Pharmacist Clinical Goal(s): o Over the next 30 days, patient will work with PharmD and providers to achieve optimal medication adherence . Current pharmacy: [facility] . Interventions o Comprehensive medication review performed. o Advertising copywriter for medication synchronization, packaging and delivery o Renew PAP for Ozempic, [place], [place] for 2022 . Patient self care activities - Over the next 30 days, patient will: o Focus on medication adherence by pill packs o Take medications as prescribed o Report any questions or concerns to PharmD and/or provider(s)  Please see past updates related to this goal by clicking on the "Past Updates" button in the selected goal        Diabetes   A1c goal < 7%  Recent Relevant Labs: Lab Results  Component Value Date/Time   HGBA1C 6.3 (H) 01/23/2020 11:11 AM   HGBA1C 6.9 (H) 08/12/2019 02:12 PM   GFR 96.68 08/12/2019 02:12 PM   GFR 95.26 02/06/2019 12:20 PM   MICROALBUR 6.7 (H) 02/06/2019 12:20 PM   MICROALBUR 2.6 (H) 02/07/2018 04:24 PM   Eye exam:  Lab Results  Component Value Date/Time   HMDIABEYEEXA No Retinopathy 11/01/2018 12:00 AM    Last diabetic Foot exam: No results found for: HMDIABFOOTEX   Checking BG: Daily  Recent FBG Readings: 92-102  Patient has failed these meds in past: Farxiga, glipizide, metformin Patient is currently controlled on the following medications:   Jardiance 10 mg daily ([employer] - delivered to pt)  Ozempic 0.5 mg weekly [employer] - delivered to office)  We discussed: diet and exercise extensively, pt is approved for both Jardiance and Ozempic PAP; however she has been getting [facility] at [facility] several months. Discussed these medications are refilled directly from manufacturer. Working on Careers information officer for [id number].  Plan  Continue current medications and control with diet and exercise  Renew PAP for  2022   Hypertension   BP goal is:  <130/80  Office blood pressures are  BP Readings from Last 3 Encounters:  04/06/20 120/72  02/20/20 110/60  01/23/20 110/68   Kidney Function Lab Results  Component Value Date/Time   CREATININE 0.76 01/23/2020 11:11 AM   CREATININE 0.77 01/06/2020 03:19 AM   CREATININE 0.87 01/05/2020 02:18 PM   GFR 96.68 08/12/2019 02:12 PM   GFRNONAA 76 01/23/2020 11:11 AM   GFRAA 88 01/23/2020 11:11 AM  K 3.6 01/23/2020 11:11 AM   K 3.1 (L) 01/06/2020 03:19 AM   Patient has failed these meds in the past: n/a Patient is currently controlled on the following medications:   No medications  Patient checks BP at home daily  Patient home BP readings are ranging: SBP 119-136  We discussed diet and exercise extensively; pt was taken off of HCTZ and potassium after hospitalization July 2021. BP is doing well at home.  Plan  Continue control with diet and exercise     Hyperlipidemia/CAD   LDL goal < 70  Lipid Panel     Component Value Date/Time   CHOL 129 08/12/2019 1412   TRIG 133.0 08/12/2019 1412   HDL 55.60 08/12/2019 1412   CHOLHDL 2 08/12/2019 1412   VLDL 26.6 08/12/2019 1412   LDLCALC 46 08/12/2019 1412   LDLDIRECT 113.0 07/21/2017 1643   Hepatic Function Latest Ref Rng & Units 08/12/2019 02/06/2019 11/05/2018  Total Protein 6.0 - 8.3 g/dL 7.4 7.7 8.3  Albumin 3.5 - 5.2 g/dL 4.3 4.7 4.8  AST 0 - 37 U/L _0 ALT 0 - 35 U/L _1 Alk Phosphatase 39 - 117 U/L 62 56 57  Total Bilirubin 0.2 - 1.2 mg/dL 0.3 0.3 0.3  Bilirubin, Direct 0.0 - 0.3 mg/dL - - -   Patient has failed these meds in past: n/a Patient is currently controlled on the following medications:   rosuvastatin 5 mg daily,   ezetimibe 10 mg daily,   aspirin 81 mg daily,   nitroglycerin 0.4 mg SL prn  We discussed:  diet and exercise extensively; Cholesterol goals; benefits of statin for ASCVD risk reduction  Plan  Continue current medications and control  with diet and exercise  Constipation   Patient has failed these meds in past: n/a Patient is currently controlled on the following medications:   Linzess 145 mcg daily prn (samples)  [name] 24 mcg daily?? - [facility]  Senna-docusate   Probiotic daily  Miralax PRN  Beano PRN  Gas-X PRN  We discussed: Pt uses Linzess sparingly, believes it is too strong for her. Pt is taking [name] twice a day, [facility] and reports good control of constipation but it is expensive (~$50/month). She was approved for PAP earlier in the year but unfortunately has not been refilling med through PAP. It is now time to renew PAP for 2022.  Plan  Continue current medications  Renew [name] PAP for 2022  Neuropathy   Patient has failed these meds in past: n/a Patient is currently controlled on the following medications:   pregabalin 75 mg TID  We discussed: Pt reports medication does help with nerve pain. It is expensive in [place]ut [place] but pt is willing to pay.  Plan  Continue current medications  Medication Management   [facility] Does not use pill box Pt endorses 99% compliance - misses dose about once a month  We discussed: Reviewed patient's [facility] medication and Epic medication profile assuring there are no discrepancies or gaps in therapy. Confirmed all fill dates appropriate and verified with patient that there is a sufficient quantity of all prescribed medications at home. Informed patient to call me any time if needing medications before scheduled deliveries.   Currently enrolled in patient assistance for: Jardiance ([employer]) Ozempic [employer]) [name] [place])  Unfortunately she has only been taking advantage of Ozempic PAP, others have been refilled  [facility] at high cost. Discussed PAP at length, advised [facility] not [facility] again. All maintenance medications will come through  [facility].  Plan  Utilize [facility] pharmacy for medication synchronization, packaging and delivery      Follow up: 1 month phone visit  Charlene Brooke, PharmD, [facility] Clinical Pharmacist [facility] at [facility] [phone]

## 2020-06-02 NOTE — Patient Instructions (Signed)
Visit Information  Phone number for Pharmacist: [phone]  Goals Addressed            This Visit's Progress   . Pharmacy Care Plan   On track    CARE PLAN ENTRY (see longitudinal plan of care for additional care plan information)  Current Barriers:  . Chronic Disease Management support, education, and care coordination needs related to Hypertension, Hyperlipidemia, Diabetes, and Coronary Artery Disease   Hypertension BP Readings from Last 3 Encounters:  02/20/20 110/60  01/23/20 110/68  01/06/20 (!) 109/55 .  Pharmacist Clinical Goal(s): o Over the next 30 days, patient will work with PharmD and providers to maintain BP goal <130/80 . Current regimen:  o No medications . Interventions: o Discussed BP goals and benefits of medications for prevention of heart attack / stroke . Patient self care activities - Over the next 30 days, patient will: o Check BP as needed, document, and provide at future appointments o Ensure daily salt intake < 2300 mg/day  Hyperlipidemia / CAD Lab Results  Component Value Date/Time   LDLCALC 46 08/12/2019 02:12 PM   LDLDIRECT 113.0 07/21/2017 04:43 PM .  Pharmacist Clinical Goal(s): o Over the next 30 days, patient will work with PharmD and providers to maintain LDL goal < 70 . Current regimen:  o rosuvastatin 5 mg daily o ezetimibe 10 mg daily o aspirin 81 mg daily o nitroglycerin 0.4 mg as needed . Interventions: o Discussed cholesterol goals and benefits of medications for prevention of heart attack / stroke . Patient self care activities - Over the next 30 days, patient will: o Continue current medications  Diabetes Lab Results  Component Value Date/Time   HGBA1C 6.3 (H) 01/23/2020 11:11 AM   HGBA1C 6.9 (H) 08/12/2019 02:12 PM .  Pharmacist Clinical Goal(s): o Over the next 30 days, patient will work with PharmD and providers to maintain A1c goal <7% . Current regimen:  o Jardiance 10 mg daily (via [employer] - delivered to  patient) o Ozempic 0.5 mg weekly (via [employer] - delivered to office) . Interventions: o Discussed blood sugar goals and benefits of medications for prevention of diabetic complications . Patient self care activities - Over the next 30 days, patient will: o Check blood sugar twice daily, document, and provide at future appointments o Contact provider with any episodes of hypoglycemia o Contact [employer] for refills of Jardiance.  o Contact office for refills of Ozempic via [employer]  Medication management . Pharmacist Clinical Goal(s): o Over the next 30 days, patient will work with PharmD and providers to achieve optimal medication adherence . Current pharmacy: [facility] . Interventions o Comprehensive medication review performed. o Advertising copywriter for medication synchronization, packaging and delivery o Renew PAP for Ozempic, [place], [place] for 2022 . Patient self care activities - Over the next 30 days, patient will: o Focus on medication adherence by pill packs o Take medications as prescribed o Report any questions or concerns to PharmD and/or provider(s)  Please see past updates related to this goal by clicking on the "Past Updates" button in the selected goal       The patient verbalized understanding of instructions, educational materials, and care plan provided today and declined offer to receive copy of patient instructions, educational materials, and care plan.  Telephone follow up appointment with pharmacy team member scheduled for: 1 month  Charlene Brooke, PharmD, [facility] Clinical Pharmacist [facility] at [facility] [phone]

## 2020-06-03 ENCOUNTER — Telehealth: Payer: Self-pay | Admitting: Pharmacist

## 2020-06-05 ENCOUNTER — Telehealth: Payer: HMO

## 2020-06-05 ENCOUNTER — Telehealth: Payer: Self-pay | Admitting: Pharmacist

## 2020-06-05 NOTE — Progress Notes (Signed)
Chronic Care Management Pharmacy Assistant   Name: [name]  MRN: [id number] DOB: Mar 02, 1941  Reason for Encounter: PAP follow up   PCP : Binnie Rail, MD  Allergies:   Allergies  Allergen Reactions  . Cymbalta [Duloxetine Hcl] Hives and Itching  . Effexor [Venlafaxine] Nausea Only  . Other Other (See Comments)    "SEEDED" food due to stomach issues  . Statins Nausea And Vomiting  . Penicillins Hives and Rash    Has patient had a PCN reaction causing immediate rash, facial/tongue/throat swelling, SOB or lightheadedness with hypotension: Yes Has patient had a PCN reaction causing severe rash involving mucus membranes or skin necrosis: No Has patient had a PCN reaction that required hospitalization: No Has patient had a PCN reaction occurring within the last 10 years: Yes If all of the above answers are "NO", then may proceed with Cephalosporin use.     Medications: Outpatient Encounter Medications as of 06/05/2020  Medication Sig Note  . acetaminophen (TYLENOL) 500 MG tablet Take 500 mg by mouth every 6 (six) hours as needed for headache (pain).   Marland Kitchen albuterol (PROAIR HFA) 108 (90 BASE) MCG/ACT inhaler Inhale 2 puffs into the lungs every 6 (six) hours as needed for wheezing or shortness of breath.    . Alpha-D-Galactosidase (BEANO PO) Take 1-2 tablets by mouth daily as needed (for gas).   Marland Kitchen aspirin EC 81 MG tablet Take 1 tablet (81 mg total) by mouth daily.   Marland Kitchen azelastine (ASTELIN) 0.1 % nasal spray Place 2 sprays into both nostrils 2 (two) times daily as needed for rhinitis. Use in each nostril as directed   . blood glucose meter kit and supplies KIT Dispense based on patient and insurance preference. Use up to four times daily as directed. (FOR ICD-10: E11.9).   . clonazePAM (KLONOPIN) 0.5 MG tablet TAKE 1/2 TO 1 TABLET TWICE A DAY AS NEEDED FOR ANXIETY (Patient taking differently: Take 0.5 mg by mouth 2 (two) times daily as needed for anxiety. )   . ezetimibe  (ZETIA) 10 MG tablet TAKE ONE TABLET BY MOUTH ONCE DAILY   . glucose blood (ONETOUCH ULTRA) test strip USE UP TO 4 TIMES DAILY AS DIRECTED   . JARDIANCE 10 MG TABS tablet TAKE 1 TABLET BY MOUTH EVERY DAY (Patient taking differently: Take 10 mg by mouth daily. ) 06/02/2020: [employer] PAP  . Lancets MISC Use to check blood sugars up to 4 times daily as directed. E11.9   . linaclotide (LINZESS) 145 MCG CAPS capsule Take 145 mcg by mouth daily before breakfast. PRN - samples   . lubiprostone ([name]) 24 MCG capsule TAKE 1 CAPSULE (24 MCG TOTAL) BY MOUTH 2 (TWO) TIMES DAILY WITH A MEAL. 06/02/2020: [employer] PAP  . meclizine (ANTIVERT) 12.5 MG tablet TAKE 1 TABLET (12.5 MG TOTAL) BY MOUTH 3 (THREE) TIMES DAILY AS NEEDED FOR DIZZINESS.   . Multiple Vitamin (MULTIVITAMIN WITH MINERALS) TABS tablet Take 1 tablet by mouth daily after lunch.   . nitroGLYCERIN (NITROSTAT) 0.4 MG SL tablet Place 1 tablet (0.4 mg total) under the tongue every 5 (five) minutes as needed for chest pain. 01/05/2020: 2 tablets on 7/2 and 2 tablets on 7/8  . omeprazole (PRILOSEC) 20 MG capsule TAKE ONE CAPSULE BY MOUTH EVERY MORNING   . OVER THE COUNTER MEDICATION Place 1 drop into both eyes 2 (two) times daily as needed (dry eyes). Over the counter eye drop for itching   . OXcarbazepine (TRILEPTAL) 150 MG  tablet TAKE 1 TABLET BY MOUTH TWICE A DAY (Patient taking differently: Take 150 mg by mouth 2 (two) times daily. )   . OZEMPIC, 0.25 OR 0.5 MG/DOSE, 2 MG/1.5ML SOPN Inject 0.375 mLs (0.5 mg total) into the skin once a week. 06/02/2020: [employer] PAP  . Polyvinyl Alcohol-Povidone (REFRESH OP) Place 1 drop into both eyes daily as needed (dry eyes).   . pregabalin (LYRICA) 75 MG capsule TAKE 1 CAPSULE (75 MG TOTAL) BY MOUTH 3 (THREE) TIMES DAILY.   . Probiotic Product (PROBIOTIC PO) Take 1 capsule by mouth daily after breakfast.    . rosuvastatin (CRESTOR) 5 MG tablet TAKE 1 TABLET BY MOUTH EVERY DAY   . sennosides-docusate sodium  (SENOKOT-S) 8.6-50 MG tablet Take 1-2 tablets by mouth daily as needed for constipation.   . [DISCONTINUED] Calcium Carbonate (CALCIUM 500 PO) Take 1 capsule by mouth every other day.     No facility-administered encounter medications on file as of 06/05/2020.    Current Diagnosis: Patient Active Problem List   Diagnosis Date Noted  . Change in stool 01/23/2020  . Hypokalemia 01/06/2020  . Unstable angina ([place]) 01/05/2020  . CAD (coronary artery disease) 05/28/2018  . Neck pain 07/21/2017  . Dysuria 04/10/2017  . Decreased hearing of right ear 02/01/2017  . Primary osteoarthritis of left knee 01/12/2017  . Constipation 01/10/2017  . Hypercalcemia 10/12/2016  . Chest pain 10/12/2016  . DOE (dyspnea on exertion) 10/12/2016  . Chronic back pain 08/23/2016  . Hair loss 08/23/2016  . Diabetes ([place]) 07/26/2016  . Anxiety 09/14/2015  . Diverticulitis large intestine w/o perforation or abscess w/o bleeding 05/12/2014  . Essential hypertension 05/12/2014  . Diabetic neuropathy ([place]) 05/12/2014  . Seizure disorder ([place]) 05/12/2014  . Hepatic steatosis 05/12/2014  . Cerebrovascular disease, unspecified 03/13/2013  . Allergic rhinitis 03/13/2013  . GOITER, MULTINODULAR 05/18/2009  . VERTIGO 04/13/2009  . Irritable bowel syndrome 03/20/2009  . Dyslipidemia 03/18/2009  . GERD 03/18/2009    Goals Addressed   None     Follow-Up:  Pharmacist Review   Follow up on PAP for the patient [name] she will need a renewal for next year but they are unable to fill before the end of the year until they get a new RX the patient is out of refills. A message has been sent to clinical pharmacist requesting a refill to be faxed.   Rosendo Gros, [facility]  Practice Team Manager/ CPA (Clinical Pharmacist Assistant) [phone]

## 2020-06-05 NOTE — Telephone Encounter (Signed)
-----   Message from Charlton Haws, [facility] sent at 06/02/2020  4:32 PM EST ----- Regarding: Call [employer] Call [employer] patient assistance [phone];  see if they will refill [name] for patient before the end of the year. Also ask if they are accepting renewals for [name] for 2022.

## 2020-06-08 ENCOUNTER — Telehealth: Payer: Self-pay | Admitting: Pharmacist

## 2020-06-08 ENCOUNTER — Other Ambulatory Visit: Payer: Self-pay

## 2020-06-08 NOTE — Progress Notes (Addendum)
Chronic Care Management Pharmacy Assistant   Name: [name]  MRN: [id number] DOB: 1940-10-08  Reason for Encounter: Medication Review  Patient Questions:  1.  Have you seen any other providers since your last visit? No  2.  Any changes in your medicines or health? No   PCP : Binnie Rail, MD  Allergies:   Allergies  Allergen Reactions   Cymbalta [Duloxetine Hcl] Hives and Itching   Effexor [Venlafaxine] Nausea Only   Other Other (See Comments)    "SEEDED" food due to stomach issues   Statins Nausea And Vomiting   Penicillins Hives and Rash    Has patient had a PCN reaction causing immediate rash, facial/tongue/throat swelling, SOB or lightheadedness with hypotension: Yes Has patient had a PCN reaction causing severe rash involving mucus membranes or skin necrosis: No Has patient had a PCN reaction that required hospitalization: No Has patient had a PCN reaction occurring within the last 10 years: Yes If all of the above answers are "NO", then may proceed with Cephalosporin use.     Medications: Outpatient Encounter Medications as of 06/08/2020  Medication Sig Note   acetaminophen (TYLENOL) 500 MG tablet Take 500 mg by mouth every 6 (six) hours as needed for headache (pain).    albuterol (PROAIR HFA) 108 (90 BASE) MCG/ACT inhaler Inhale 2 puffs into the lungs every 6 (six) hours as needed for wheezing or shortness of breath.     Alpha-D-Galactosidase (BEANO PO) Take 1-2 tablets by mouth daily as needed (for gas).    aspirin EC 81 MG tablet Take 1 tablet (81 mg total) by mouth daily.    azelastine (ASTELIN) 0.1 % nasal spray Place 2 sprays into both nostrils 2 (two) times daily as needed for rhinitis. Use in each nostril as directed    blood glucose meter kit and supplies KIT Dispense based on patient and insurance preference. Use up to four times daily as directed. (FOR ICD-10: E11.9).    clonazePAM (KLONOPIN) 0.5 MG tablet TAKE 1/2 TO 1 TABLET TWICE A DAY AS  NEEDED FOR ANXIETY (Patient taking differently: Take 0.5 mg by mouth 2 (two) times daily as needed for anxiety. )    ezetimibe (ZETIA) 10 MG tablet TAKE ONE TABLET BY MOUTH ONCE DAILY    glucose blood (ONETOUCH ULTRA) test strip USE UP TO 4 TIMES DAILY AS DIRECTED    JARDIANCE 10 MG TABS tablet TAKE 1 TABLET BY MOUTH EVERY DAY (Patient taking differently: Take 10 mg by mouth daily. ) 06/02/2020: [employer] Cares PAP   Lancets MISC Use to check blood sugars up to 4 times daily as directed. E11.9    linaclotide (LINZESS) 145 MCG CAPS capsule Take 145 mcg by mouth daily before breakfast. PRN - samples    lubiprostone ([name]) 24 MCG capsule TAKE 1 CAPSULE (24 MCG TOTAL) BY MOUTH 2 (TWO) TIMES DAILY WITH A MEAL. 06/02/2020: Takeda PAP   meclizine (ANTIVERT) 12.5 MG tablet TAKE 1 TABLET (12.5 MG TOTAL) BY MOUTH 3 (THREE) TIMES DAILY AS NEEDED FOR DIZZINESS.    Multiple Vitamin (MULTIVITAMIN WITH MINERALS) TABS tablet Take 1 tablet by mouth daily after lunch.    nitroGLYCERIN (NITROSTAT) 0.4 MG SL tablet Place 1 tablet (0.4 mg total) under the tongue every 5 (five) minutes as needed for chest pain. 01/05/2020: 2 tablets on 7/2 and 2 tablets on 7/8   omeprazole (PRILOSEC) 20 MG capsule TAKE ONE CAPSULE BY MOUTH EVERY MORNING    OVER THE COUNTER MEDICATION Place  1 drop into both eyes 2 (two) times daily as needed (dry eyes). Over the counter eye drop for itching    OXcarbazepine (TRILEPTAL) 150 MG tablet TAKE 1 TABLET BY MOUTH TWICE A DAY (Patient taking differently: Take 150 mg by mouth 2 (two) times daily. )    OZEMPIC, 0.25 OR 0.5 MG/DOSE, 2 MG/1.5ML SOPN Inject 0.375 mLs (0.5 mg total) into the skin once a week. 06/02/2020: [employer] Cares PAP   Polyvinyl Alcohol-Povidone (REFRESH OP) Place 1 drop into both eyes daily as needed (dry eyes).    pregabalin (LYRICA) 75 MG capsule TAKE 1 CAPSULE (75 MG TOTAL) BY MOUTH 3 (THREE) TIMES DAILY.    Probiotic Product (PROBIOTIC PO) Take 1 capsule by mouth daily after breakfast.      rosuvastatin (CRESTOR) 5 MG tablet TAKE 1 TABLET BY MOUTH EVERY DAY    sennosides-docusate sodium (SENOKOT-S) 8.6-50 MG tablet Take 1-2 tablets by mouth daily as needed for constipation.    [DISCONTINUED] Calcium Carbonate (CALCIUM 500 PO) Take 1 capsule by mouth every other day.     No facility-administered encounter medications on file as of 06/08/2020.    Current Diagnosis: Patient Active Problem List   Diagnosis Date Noted   Change in stool 01/23/2020   Hypokalemia 01/06/2020   Unstable angina ([place]) 01/05/2020   CAD (coronary artery disease) 05/28/2018   Neck pain 07/21/2017   Dysuria 04/10/2017   Decreased hearing of right ear 02/01/2017   Primary osteoarthritis of left knee 01/12/2017   Constipation 01/10/2017   Hypercalcemia 10/12/2016   Chest pain 10/12/2016   DOE (dyspnea on exertion) 10/12/2016   Chronic back pain 08/23/2016   Hair loss 08/23/2016   Diabetes ([place]) 07/26/2016   Anxiety 09/14/2015   Diverticulitis large intestine w/o perforation or abscess w/o bleeding 05/12/2014   Essential hypertension 05/12/2014   Diabetic neuropathy ([place]) 05/12/2014   Seizure disorder ([place]) 05/12/2014   Hepatic steatosis 05/12/2014   Cerebrovascular disease, unspecified 03/13/2013   Allergic rhinitis 03/13/2013   GOITER, MULTINODULAR 05/18/2009   VERTIGO 04/13/2009   Irritable bowel syndrome 03/20/2009   Dyslipidemia 03/18/2009   GERD 03/18/2009    Goals Addressed   None     Follow-Up:  Coordination of Enhanced Pharmacy Services   Reviewed chart for medication changes ahead of medication coordination call.  No OVs, Consults, or hospital visits since last care coordination call/Pharmacist visit.  No medication changes indicated   BP Readings from Last 3 Encounters:  04/06/20 120/72  02/20/20 110/60  01/23/20 110/68    Lab Results  Component Value Date   HGBA1C 6.3 (H) 01/23/2020     Patient obtains medications through Adherence Packaging  90 Days   Last  adherence delivery included: Ezetimibe 10 mg 1 tab daily Pregabalin 75 mg 1 cap three times daily Omeprazole 20 mg 1 cap every morning  Rosuvastatin 5 mg 1 tab daily at bedtime Oxcarbazepine 150 mg 1 tab every morning and bedtime  Patient is due for next adherence delivery on: 08/17/2020. Called patient and reviewed medications and coordinated delivery.  Patient does not need any medications filled at this time.   Confirmed delivery date of 08/17/2020, advised patient that pharmacy will contact them the morning of delivery.   Wendy Poet, Clinical Pharmacist Assistant [facility]

## 2020-06-08 NOTE — Patient Outreach (Signed)
  [facility]) Care Management Chronic Special Needs Program    06/08/2020  Name: [name], DOB: Nov 23, 1940  MRN: [id number]   [name] is enrolled in a chronic special needs plan for Diabetes.  [facility] will continue to provide services for this client through 06/26/2020. The [employer] care management team will assume care 06/27/2020.  Peter Garter RN, Jackquline Denmark, CDE Chronic Care Management Coordinator [facility] Care Management [phone]

## 2020-06-09 ENCOUNTER — Ambulatory Visit: Payer: HMO | Admitting: Gastroenterology

## 2020-06-15 NOTE — Progress Notes (Signed)
A user error has taken place: encounter opened in error, closed for administrative reasons.

## 2020-06-16 ENCOUNTER — Telehealth: Payer: Self-pay | Admitting: Internal Medicine

## 2020-06-16 ENCOUNTER — Other Ambulatory Visit: Payer: Self-pay

## 2020-06-16 NOTE — Telephone Encounter (Signed)
Left message for patient to call back and schedule Medicare Annual Wellness Visit (AWV) with Nurse Health Advisor. This can be scheduled IN OFFICE or VIRTUAL/TELEPHONE VISIT.  This should be a 45 minute visit  Last AWV 11/05/18

## 2020-06-16 NOTE — Patient Outreach (Signed)
  [facility]) Care Management Chronic Special Needs Program    06/16/2020  Name: [name], DOB: 09/24/1940  MRN: [id number]   Ms. Dolorez Jeffrey is enrolled in a chronic special needs plan for Diabetes.  [employer] Care Management Team has assumed care and services for this member. Case closed by [place] RN, [facility], [place] Management [phone]

## 2020-07-02 ENCOUNTER — Telehealth: Payer: Self-pay | Admitting: Pharmacist

## 2020-07-07 ENCOUNTER — Other Ambulatory Visit: Payer: Self-pay | Admitting: Internal Medicine

## 2020-07-07 NOTE — Progress Notes (Addendum)
Chronic Care Management Pharmacy Assistant   Name: [name]  MRN: [id number] DOB: 06-Sep-1940  Reason for Encounter: Medication Review  Patient Questions:  1.  Have you seen any other providers since your last visit? No  2.  Any changes in your medicines or health? No    PCP : Binnie Rail, MD  Allergies:   Allergies  Allergen Reactions   Cymbalta [Duloxetine Hcl] Hives and Itching   Effexor [Venlafaxine] Nausea Only   Other Other (See Comments)    "SEEDED" food due to stomach issues   Statins Nausea And Vomiting   Penicillins Hives and Rash    Has patient had a PCN reaction causing immediate rash, facial/tongue/throat swelling, SOB or lightheadedness with hypotension: Yes Has patient had a PCN reaction causing severe rash involving mucus membranes or skin necrosis: No Has patient had a PCN reaction that required hospitalization: No Has patient had a PCN reaction occurring within the last 10 years: Yes If all of the above answers are "NO", then may proceed with Cephalosporin use.     Medications: Outpatient Encounter Medications as of 07/02/2020  Medication Sig Note   acetaminophen (TYLENOL) 500 MG tablet Take 500 mg by mouth every 6 (six) hours as needed for headache (pain).    albuterol (PROAIR HFA) 108 (90 BASE) MCG/ACT inhaler Inhale 2 puffs into the lungs every 6 (six) hours as needed for wheezing or shortness of breath.     Alpha-D-Galactosidase (BEANO PO) Take 1-2 tablets by mouth daily as needed (for gas).    aspirin EC 81 MG tablet Take 1 tablet (81 mg total) by mouth daily.    azelastine (ASTELIN) 0.1 % nasal spray Place 2 sprays into both nostrils 2 (two) times daily as needed for rhinitis. Use in each nostril as directed    blood glucose meter kit and supplies KIT Dispense based on patient and insurance preference. Use up to four times daily as directed. (FOR ICD-10: E11.9).    clonazePAM (KLONOPIN) 0.5 MG tablet TAKE 1/2 TO 1 TABLET TWICE A DAY AS  NEEDED FOR ANXIETY (Patient taking differently: Take 0.5 mg by mouth 2 (two) times daily as needed for anxiety. )    ezetimibe (ZETIA) 10 MG tablet TAKE ONE TABLET BY MOUTH ONCE DAILY    glucose blood (ONETOUCH ULTRA) test strip USE UP TO 4 TIMES DAILY AS DIRECTED    JARDIANCE 10 MG TABS tablet TAKE 1 TABLET BY MOUTH EVERY DAY (Patient taking differently: Take 10 mg by mouth daily. ) 06/02/2020: BI Cares PAP   Lancets MISC Use to check blood sugars up to 4 times daily as directed. E11.9    linaclotide (LINZESS) 145 MCG CAPS capsule Take 145 mcg by mouth daily before breakfast. PRN - samples    lubiprostone ([name]) 24 MCG capsule TAKE 1 CAPSULE (24 MCG TOTAL) BY MOUTH 2 (TWO) TIMES DAILY WITH A MEAL. 06/02/2020: Takeda PAP   meclizine (ANTIVERT) 12.5 MG tablet TAKE 1 TABLET (12.5 MG TOTAL) BY MOUTH 3 (THREE) TIMES DAILY AS NEEDED FOR DIZZINESS.    Multiple Vitamin (MULTIVITAMIN WITH MINERALS) TABS tablet Take 1 tablet by mouth daily after lunch.    nitroGLYCERIN (NITROSTAT) 0.4 MG SL tablet Place 1 tablet (0.4 mg total) under the tongue every 5 (five) minutes as needed for chest pain. 01/05/2020: 2 tablets on 7/2 and 2 tablets on 7/8   omeprazole (PRILOSEC) 20 MG capsule TAKE ONE CAPSULE BY MOUTH EVERY MORNING    OVER THE COUNTER MEDICATION  Place 1 drop into both eyes 2 (two) times daily as needed (dry eyes). Over the counter eye drop for itching    OXcarbazepine (TRILEPTAL) 150 MG tablet TAKE 1 TABLET BY MOUTH TWICE A DAY (Patient taking differently: Take 150 mg by mouth 2 (two) times daily. )    OZEMPIC, 0.25 OR 0.5 MG/DOSE, 2 MG/1.5ML SOPN Inject 0.375 mLs (0.5 mg total) into the skin once a week. 06/02/2020: Novo Cares PAP   Polyvinyl Alcohol-Povidone (REFRESH OP) Place 1 drop into both eyes daily as needed (dry eyes).    pregabalin (LYRICA) 75 MG capsule TAKE 1 CAPSULE (75 MG TOTAL) BY MOUTH 3 (THREE) TIMES DAILY.    Probiotic Product (PROBIOTIC PO) Take 1 capsule by mouth daily after breakfast.      rosuvastatin (CRESTOR) 5 MG tablet TAKE 1 TABLET BY MOUTH EVERY DAY    sennosides-docusate sodium (SENOKOT-S) 8.6-50 MG tablet Take 1-2 tablets by mouth daily as needed for constipation.    No facility-administered encounter medications on file as of 07/02/2020.    Current Diagnosis: Patient Active Problem List   Diagnosis Date Noted   Change in stool 01/23/2020   Hypokalemia 01/06/2020   Unstable angina ([place]) 01/05/2020   CAD (coronary artery disease) 05/28/2018   Neck pain 07/21/2017   Dysuria 04/10/2017   Decreased hearing of right ear 02/01/2017   Primary osteoarthritis of left knee 01/12/2017   Constipation 01/10/2017   Hypercalcemia 10/12/2016   Chest pain 10/12/2016   DOE (dyspnea on exertion) 10/12/2016   Chronic back pain 08/23/2016   Hair loss 08/23/2016   Diabetes ([place]) 07/26/2016   Anxiety 09/14/2015   Diverticulitis large intestine w/o perforation or abscess w/o bleeding 05/12/2014   Essential hypertension 05/12/2014   Diabetic neuropathy ([place]) 05/12/2014   Seizure disorder ([place]) 05/12/2014   Hepatic steatosis 05/12/2014   Cerebrovascular disease, unspecified 03/13/2013   Allergic rhinitis 03/13/2013   GOITER, MULTINODULAR 05/18/2009   VERTIGO 04/13/2009   Irritable bowel syndrome 03/20/2009   Dyslipidemia 03/18/2009   GERD 03/18/2009    Goals Addressed   None     Follow-Up:  Coordination of Enhanced Pharmacy Services   Reviewed chart for medication changes ahead of medication coordination call.  No OVs, Consults, or hospital visits since last care coordination call/Pharmacist visit.  No medication changes indicated   BP Readings from Last 3 Encounters:  04/06/20 120/72  02/20/20 110/60  01/23/20 110/68    Lab Results  Component Value Date   HGBA1C 6.3 (H) 01/23/2020     Patient obtains medications through Vials  30 Days   Last adherence delivery included:  Ezetimibe 10 mg 1 tab daily Pregabalin 75 mg 1 cap three times daily Omeprazole  20 mg 1 cap every morning  Rosuvastatin 5 mg 1 tab daily at bedtime Oxcarbazepine 150 mg 1 tab every morning and bedtime   Patient is due for next adherence delivery on: 07/07/2020. Called patient and reviewed medications and coordinated delivery.  This delivery to include: Potassium Cl ER 20 meq 1 tab by mouth 3 times daily breakfast, lunch, bedtime Hydrochlorothiazide 25 mg 1 tab by mouth daily  Coordinated acute fill for Potassium, and Hydrochlorothiazide to be delivered 07/07/2020.  Patient declined the following medications due to 75 DS til February Ezetimibe 10 mg 1 tab daily Pregabalin 75 mg 1 cap three times daily Omeprazole 20 mg 1 cap every morning  Rosuvastatin 5 mg 1 tab daily at bedtime Oxcarbazepine 150 mg 1 tab every morning and bedtime  Confirmed delivery  date of 07/07/2020, advised patient that pharmacy will contact them the morning of delivery.   Wendy Poet, Clinical Pharmacist Assistant [facility] [phone]   Pharmacist addendum: Patient was taken off of HCTZ and Kcl in July during a hospitalization and they were not resumed outpatient. Per pharmacy they have no been filled since then. I have let pharmacy know not to refill these unless they receive a new prescription from a provider.  Charlton Haws, [facility]

## 2020-07-08 NOTE — Telephone Encounter (Signed)
Please call pt and see if she is taking.  It was d/c'd but not sure if it should have been.  If she is taking please renew.

## 2020-07-09 NOTE — Telephone Encounter (Signed)
Left message for patient to return call and let us know if she wants medication refilled or not.

## 2020-07-09 NOTE — Telephone Encounter (Signed)
Patient states she wants to start taking medication again. She states medication was discontinued but she can tell the difference in her body and wants to resume taking. She had a few pills remaining but they are expiring   Requesting refill for Potassium be sent to  [id number] - [place], [place]

## 2020-07-17 ENCOUNTER — Telehealth: Payer: HMO

## 2020-07-17 NOTE — Chronic Care Management (AMB) (Deleted)
Chronic Care Management Pharmacy  Name: [name]  MRN: [id number] DOB: [birth year]  Chief Complaint/ HPI  [name],  80 y.o. , female presents for their Follow-Up CCM visit with the clinical pharmacist via telephone due to COVID-19 Pandemic.  PCP : Binnie Rail, MD  Their chronic conditions include: HTN, CAD, T2DM, HLD, GERD, IBS, seizure disorder, allergies, arthritis  Pt reports she takes care of herself, but does get tired doing household chores. He did have a fall in November, still has pain from that- R arm pain, does stretches with both arms.    Office Visits: 01/23/20 Dr Quay Burow OV: continue to hold HCTZ and potassium, monitor BP.  08/12/19 Dr Quay Burow Ok EdwardsRolan Lipa working better than amitiza, gave samples and sent refills. Rx'd meclizine for vertigo. Started omeprazole due to famotidine ineffective.  Consult Visit:  02/20/20 NP Cecilie Kicks (cardiology): reviewed CATH results from hospital, do not explain chest pain, images similar to 2019. Risk factor modification rec'd, no med changes.  02/04/20 [facility] - R shoulder injection given.  01/05/20 - 01/06/20 Hospital admission: chest pain, LHC 7/12, rec'd aggressive risk factor modification. CP likely musculoskeletal. Scheduled PPI. Hold HCTZ (and KCl) at discharge due to soft BP inpatient.  Allergies  Allergen Reactions  . Cymbalta [Duloxetine Hcl] Hives and Itching  . Effexor [Venlafaxine] Nausea Only  . Other Other (See Comments)    "SEEDED" food due to stomach issues  . Statins Nausea And Vomiting  . Penicillins Hives and Rash    Has patient had a PCN reaction causing immediate rash, facial/tongue/throat swelling, SOB or lightheadedness with hypotension: Yes Has patient had a PCN reaction causing severe rash involving mucus membranes or skin necrosis: No Has patient had a PCN reaction that required hospitalization: No Has patient had a PCN reaction occurring within the last 10 years: Yes If all  of the above answers are "NO", then may proceed with Cephalosporin use.    Medications: Outpatient Encounter Medications as of 07/17/2020  Medication Sig Note  . KLOR-CON M20 20 MEQ tablet TAKE 1 TABLET (20 MEQ TOTAL) BY MOUTH 3 (THREE) TIMES DAILY.   Marland Kitchen acetaminophen (TYLENOL) 500 MG tablet Take 500 mg by mouth every 6 (six) hours as needed for headache (pain).   Marland Kitchen albuterol (PROAIR HFA) 108 (90 BASE) MCG/ACT inhaler Inhale 2 puffs into the lungs every 6 (six) hours as needed for wheezing or shortness of breath.    . Alpha-D-Galactosidase (BEANO PO) Take 1-2 tablets by mouth daily as needed (for gas).   Marland Kitchen aspirin EC 81 MG tablet Take 1 tablet (81 mg total) by mouth daily.   Marland Kitchen azelastine (ASTELIN) 0.1 % nasal spray Place 2 sprays into both nostrils 2 (two) times daily as needed for rhinitis. Use in each nostril as directed   . blood glucose meter kit and supplies KIT Dispense based on patient and insurance preference. Use up to four times daily as directed. (FOR ICD-10: E11.9).   . clonazePAM (KLONOPIN) 0.5 MG tablet TAKE 1/2 TO 1 TABLET TWICE A DAY AS NEEDED FOR ANXIETY (Patient taking differently: Take 0.5 mg by mouth 2 (two) times daily as needed for anxiety. )   . ezetimibe (ZETIA) 10 MG tablet TAKE ONE TABLET BY MOUTH ONCE DAILY   . glucose blood (ONETOUCH ULTRA) test strip USE UP TO 4 TIMES DAILY AS DIRECTED   . JARDIANCE 10 MG TABS tablet TAKE 1 TABLET BY MOUTH EVERY DAY (Patient taking differently: Take 10 mg by mouth  daily. ) 06/02/2020: [employer] Cares PAP  . Lancets MISC Use to check blood sugars up to 4 times daily as directed. E11.9   . linaclotide (LINZESS) 145 MCG CAPS capsule Take 145 mcg by mouth daily before breakfast. PRN - samples   . lubiprostone ([name]) 24 MCG capsule TAKE 1 CAPSULE (24 MCG TOTAL) BY MOUTH 2 (TWO) TIMES DAILY WITH A MEAL. 06/02/2020: [employer] PAP  . meclizine (ANTIVERT) 12.5 MG tablet TAKE 1 TABLET (12.5 MG TOTAL) BY MOUTH 3 (THREE) TIMES DAILY AS NEEDED FOR DIZZINESS.    . Multiple Vitamin (MULTIVITAMIN WITH MINERALS) TABS tablet Take 1 tablet by mouth daily after lunch.   . nitroGLYCERIN (NITROSTAT) 0.4 MG SL tablet Place 1 tablet (0.4 mg total) under the tongue every 5 (five) minutes as needed for chest pain. 01/05/2020: 2 tablets on 7/2 and 2 tablets on 7/8  . omeprazole (PRILOSEC) 20 MG capsule TAKE ONE CAPSULE BY MOUTH EVERY MORNING   . OVER THE COUNTER MEDICATION Place 1 drop into both eyes 2 (two) times daily as needed (dry eyes). Over the counter eye drop for itching   . OXcarbazepine (TRILEPTAL) 150 MG tablet TAKE 1 TABLET BY MOUTH TWICE A DAY (Patient taking differently: Take 150 mg by mouth 2 (two) times daily. )   . OZEMPIC, 0.25 OR 0.5 MG/DOSE, 2 MG/1.5ML SOPN Inject 0.375 mLs (0.5 mg total) into the skin once a week. 06/02/2020: [employer] C[employer] PAP  . Polyvinyl Alcohol-Povidone (REFRESH OP) Place 1 drop into both eyes daily as needed (dry eyes).   . pregabalin (LYRICA) 75 MG capsule TAKE 1 CAPSULE (75 MG TOTAL) BY MOUTH 3 (THREE) TIMES DAILY.   . Probiotic Product (PROBIOTIC PO) Take 1 capsule by mouth daily after breakfast.    . rosuvastatin (CRESTOR) 5 MG tablet TAKE 1 TABLET BY MOUTH EVERY DAY   . sennosides-docusate sodium (SENOKOT-S) 8.6-50 MG tablet Take 1-2 tablets by mouth daily as needed for constipation.   . [DISCONTINUED] Calcium Carbonate (CALCIUM 500 PO) Take 1 capsule by mouth every other day.     No facility-administered encounter medications on file as of 07/17/2020.   Wt Readings from Last 3 Encounters:  04/06/20 163 lb (73.9 kg)  02/20/20 159 lb 12.8 oz (72.5 kg)  01/23/20 162 lb (73.5 kg)   Lab Results  Component Value Date   CREATININE 0.76 01/23/2020   BUN 13 01/23/2020   GFR 96.68 08/12/2019   GFRNONAA 76 01/23/2020   GFRAA 88 01/23/2020   NA 140 01/23/2020   K 3.6 01/23/2020   CALCIUM 10.3 01/23/2020   CO2 29 01/23/2020    Current Diagnosis/Assessment:   Goals Addressed   None     Diabetes   A1c goal <  7%  Recent Relevant Labs: Lab Results  Component Value Date/Time   HGBA1C 6.3 (H) 01/23/2020 11:11 AM   HGBA1C 6.9 (H) 08/12/2019 02:12 PM   GFR 96.68 08/12/2019 02:12 PM   GFR 95.26 02/06/2019 12:20 PM   MICROALBUR 6.7 (H) 02/06/2019 12:20 PM   MICROALBUR 2.6 (H) 02/07/2018 04:24 PM   Eye exam:  Lab Results  Component Value Date/Time   HMDIABEYEEXA No Retinopathy 11/01/2018 12:00 AM    Last diabetic Foot exam: No results found for: HMDIABFOOTEX   Checking BG: Daily  Recent FBG Readings: 92-102  Patient has failed these meds in past: Farxiga, glipizide, metformin Patient is currently controlled on the following medications:   Jardiance 10 mg daily ([employer] Cares - delivered to pt)  Ozempic 0.5 mg  weekly [employer] - delivered to office)  We discussed: diet and exercise extensively, pt is approved for both Jardiance and Ozempic PAP; however she has been getting [facility] several months. Discussed these medications are refilled directly from manufacturer. Working on Careers information officer for 6122.  Plan  Continue current medications and control with diet and exercise  Renew PAP for 2022   Hypertension   BP goal is:  <130/80  Office blood pressures are  BP Readings from Last 3 Encounters:  04/06/20 120/72  02/20/20 110/60  01/23/20 110/68   Patient has failed these meds in the past: n/a Patient is currently controlled on the following medications:   No medications  Patient checks BP at home daily  Patient home BP readings are ranging: SBP 119-136  We discussed diet and exercise extensively; pt was taken off of HCTZ and potassium after hospitalization July 2021. BP is doing well at home.  Plan  Continue control with diet and exercise     Hyperlipidemia/CAD   LDL goal < 70  Lipid Panel     Component Value Date/Time   CHOL 129 08/12/2019 1412   TRIG 133.0 08/12/2019 1412   HDL 55.60 08/12/2019 1412   CHOLHDL 2 08/12/2019 1412   VLDL  26.6 08/12/2019 1412   LDLCALC 46 08/12/2019 1412   LDLDIRECT 113.0 07/21/2017 1643   Hepatic Function Latest Ref Rng & Units 08/12/2019 02/06/2019 11/05/2018  Total Protein 6.0 - 8.3 g/dL 7.4 7.7 8.3  Albumin 3.5 - 5.2 g/dL 4.3 4.7 4.8  AST 0 - 37 U/L '28 19 22  ' ALT 0 - 35 U/L '27 15 16  ' Alk Phosphatase 39 - 117 U/L 62 56 57  Total Bilirubin 0.2 - 1.2 mg/dL 0.3 0.3 0.3  Bilirubin, Direct 0.0 - 0.3 mg/dL - - -   Patient has failed these meds in past: n/a Patient is currently controlled on the following medications:   rosuvastatin 5 mg daily,   ezetimibe 10 mg daily,   aspirin 81 mg daily,   nitroglycerin 0.4 mg SL prn  We discussed:  diet and exercise extensively; Cholesterol goals; benefits of statin for ASCVD risk reduction  Plan  Continue current medications and control with diet and exercise  Constipation   Patient has failed these meds in past: n/a Patient is currently controlled on the following medications:   Linzess 145 mcg daily prn (samples)  [name] 24 mcg daily?? - [facility]  Senna-docusate   Probiotic daily  Miralax PRN  Beano PRN  Gas-X PRN  We discussed: Pt uses Linzess sparingly, believes it is too strong for her. Pt is taking [name] twice a day, has [facility] and reports good control of constipation but it is expensive (~$50/month). She was approved for PAP earlier in the year but unfortunately has not been refilling med through PAP. It is now time to renew PAP for 2022.  Plan  Continue current medications  Renew [name] PAP for 2022  Neuropathy   Patient has failed these meds in past: n/a Patient is currently controlled on the following medications:   pregabalin 75 mg TID  We discussed: Pt reports medication does help with nerve pain. It is expensive in donut hole but pt is willing to pay.  Plan  Continue current medications  Medication Management   [facility] Does not  use pill box Pt endorses 99% compliance - misses dose about once a month  We discussed: Reviewed patient's [facility] medication and Epic medication profile assuring there are no discrepancies or gaps in therapy. Confirmed all fill dates appropriate and verified with patient that there is a sufficient quantity of all prescribed medications at home. Informed patient to call me any time if needing medications before scheduled deliveries.   Currently enrolled in patient assistance for: Jardiance ([employer] Cares) Ozempic ([employer]) [place] Bernita Buffy)  Unfortunately she has only been taking advantage of Ozempic PAP, others have been refilled through [facility] at high cost. Discussed PAP at length, advised her not refill with [facility] again. All maintenance medications will come through [facility].  Plan  Utilize [facility] pharmacy for medication synchronization, packaging and delivery      Follow up: 1 month phone visit  Charlene Brooke, PharmD, [facility] Clinical Pharmacist [facility] at [facility] [phone]

## 2020-07-27 ENCOUNTER — Telehealth: Payer: Self-pay | Admitting: Pharmacist

## 2020-07-27 NOTE — Progress Notes (Signed)
Chronic Care Management Pharmacy Assistant   Name: [name]  MRN: [id number] DOB: 1941-05-07  Reason for Encounter: Chart Review   PCP : Binnie Rail, MD  Allergies:   Allergies  Allergen Reactions  . Cymbalta [Duloxetine Hcl] Hives and Itching  . Effexor [Venlafaxine] Nausea Only  . Other Other (See Comments)    "SEEDED" food due to stomach issues  . Statins Nausea And Vomiting  . Penicillins Hives and Rash    Has patient had a PCN reaction causing immediate rash, facial/tongue/throat swelling, SOB or lightheadedness with hypotension: Yes Has patient had a PCN reaction causing severe rash involving mucus membranes or skin necrosis: No Has patient had a PCN reaction that required hospitalization: No Has patient had a PCN reaction occurring within the last 10 years: Yes If all of the above answers are "NO", then may proceed with Cephalosporin use.     Medications: Outpatient Encounter Medications as of 07/27/2020  Medication Sig Note  . KLOR-CON M20 20 MEQ tablet TAKE 1 TABLET (20 MEQ TOTAL) BY MOUTH 3 (THREE) TIMES DAILY.   Marland Kitchen acetaminophen (TYLENOL) 500 MG tablet Take 500 mg by mouth every 6 (six) hours as needed for headache (pain).   Marland Kitchen albuterol (PROAIR HFA) 108 (90 BASE) MCG/ACT inhaler Inhale 2 puffs into the lungs every 6 (six) hours as needed for wheezing or shortness of breath.    . Alpha-D-Galactosidase (BEANO PO) Take 1-2 tablets by mouth daily as needed (for gas).   Marland Kitchen aspirin EC 81 MG tablet Take 1 tablet (81 mg total) by mouth daily.   Marland Kitchen azelastine (ASTELIN) 0.1 % nasal spray Place 2 sprays into both nostrils 2 (two) times daily as needed for rhinitis. Use in each nostril as directed   . blood glucose meter kit and supplies KIT Dispense based on patient and insurance preference. Use up to four times daily as directed. (FOR ICD-10: E11.9).   . clonazePAM (KLONOPIN) 0.5 MG tablet TAKE 1/2 TO 1 TABLET TWICE A DAY AS NEEDED FOR ANXIETY (Patient taking  differently: Take 0.5 mg by mouth 2 (two) times daily as needed for anxiety. )   . ezetimibe (ZETIA) 10 MG tablet TAKE ONE TABLET BY MOUTH ONCE DAILY   . glucose blood (ONETOUCH ULTRA) test strip USE UP TO 4 TIMES DAILY AS DIRECTED   . JARDIANCE 10 MG TABS tablet TAKE 1 TABLET BY MOUTH EVERY DAY (Patient taking differently: Take 10 mg by mouth daily. ) 06/02/2020: BI Cares PAP  . Lancets MISC Use to check blood sugars up to 4 times daily as directed. E11.9   . linaclotide (LINZESS) 145 MCG CAPS capsule Take 145 mcg by mouth daily before breakfast. PRN - samples   . lubiprostone ([name]) 24 MCG capsule TAKE 1 CAPSULE (24 MCG TOTAL) BY MOUTH 2 (TWO) TIMES DAILY WITH A MEAL. 06/02/2020: Taked[employer] PAP  . meclizine (ANTIVERT) 12.5 MG tablet TAKE 1 TABLET (12.5 MG TOTAL) BY MOUTH 3 (THREE) TIMES DAILY AS NEEDED FOR DIZZINESS.   . Multiple Vitamin (MULTIVITAMIN WITH MINERALS) TABS tablet Take 1 tablet by mouth daily after lunch.   . nitroGLYCERIN (NITROSTAT) 0.4 MG SL tablet Place 1 tablet (0.4 mg total) under the tongue every 5 (five) minutes as needed for chest pain. 01/05/2020: 2 tablets on 7/2 and 2 tablets on 7/8  . omeprazole (PRILOSEC) 20 MG capsule TAKE ONE CAPSULE BY MOUTH EVERY MORNING   . OVER THE COUNTER MEDICATION Place 1 drop into both eyes 2 (two) times  daily as needed (dry eyes). Over the counter eye drop for itching   . OXcarbazepine (TRILEPTAL) 150 MG tablet TAKE 1 TABLET BY MOUTH TWICE A DAY (Patient taking differently: Take 150 mg by mouth 2 (two) times daily. )   . OZEMPIC, 0.25 OR 0.5 MG/DOSE, 2 MG/1.5ML SOPN Inject 0.375 mLs (0.5 mg total) into the skin once a week. 06/02/2020: [employer] Cares PAP  . Polyvinyl Alcohol-Povidone (REFRESH OP) Place 1 drop into both eyes daily as needed (dry eyes).   . pregabalin (LYRICA) 75 MG capsule TAKE 1 CAPSULE (75 MG TOTAL) BY MOUTH 3 (THREE) TIMES DAILY.   . Probiotic Product (PROBIOTIC PO) Take 1 capsule by mouth daily after breakfast.    . rosuvastatin  (CRESTOR) 5 MG tablet TAKE 1 TABLET BY MOUTH EVERY DAY   . sennosides-docusate sodium (SENOKOT-S) 8.6-50 MG tablet Take 1-2 tablets by mouth daily as needed for constipation.   . [DISCONTINUED] Calcium Carbonate (CALCIUM 500 PO) Take 1 capsule by mouth every other day.     No facility-administered encounter medications on file as of 07/27/2020.    Current Diagnosis: Patient Active Problem List   Diagnosis Date Noted  . Change in stool 01/23/2020  . Hypokalemia 01/06/2020  . Unstable angina ([place]) 01/05/2020  . CAD (coronary artery disease) 05/28/2018  . Neck pain 07/21/2017  . Dysuria 04/10/2017  . Decreased hearing of right ear 02/01/2017  . Primary osteoarthritis of left knee 01/12/2017  . Constipation 01/10/2017  . Hypercalcemia 10/12/2016  . Chest pain 10/12/2016  . DOE (dyspnea on exertion) 10/12/2016  . Chronic back pain 08/23/2016  . Hair loss 08/23/2016  . Diabetes ([place]) 07/26/2016  . Anxiety 09/14/2015  . Diverticulitis large intestine w/o perforation or abscess w/o bleeding 05/12/2014  . Essential hypertension 05/12/2014  . Diabetic neuropathy ([place]) 05/12/2014  . Seizure disorder ([place]) 05/12/2014  . Hepatic steatosis 05/12/2014  . Cerebrovascular disease, unspecified 03/13/2013  . Allergic rhinitis 03/13/2013  . GOITER, MULTINODULAR 05/18/2009  . VERTIGO 04/13/2009  . Irritable bowel syndrome 03/20/2009  . Dyslipidemia 03/18/2009  . GERD 03/18/2009    Goals Addressed   None     Follow-Up:  Pharmacist Review   Reviewed chart for medication changes and adherence.   No gaps in adherence identified. Patient has follow up scheduled with pharmacy team. No further action required.  Wendy Poet, [place] [phone]

## 2020-08-04 DIAGNOSIS — M542 Cervicalgia: Secondary | ICD-10-CM | POA: Diagnosis not present

## 2020-08-04 DIAGNOSIS — M25511 Pain in right shoulder: Secondary | ICD-10-CM | POA: Diagnosis not present

## 2020-08-04 DIAGNOSIS — M25512 Pain in left shoulder: Secondary | ICD-10-CM | POA: Diagnosis not present

## 2020-08-05 ENCOUNTER — Telehealth: Payer: Self-pay | Admitting: Pharmacist

## 2020-08-05 NOTE — Progress Notes (Signed)
Chronic Care Management Pharmacy Assistant   Name: Gyselle Matthew  MRN: [id number] DOB: [birth year]  Reason for Encounter: Medication Review  Patient Questions:  1.  Have you seen any other providers since your last visit? No  2.  Any changes in your medicines or health? No    PCP : Binnie Rail, MD  Allergies:   Allergies  Allergen Reactions  . Cymbalta [Duloxetine Hcl] Hives and Itching  . Effexor [Venlafaxine] Nausea Only  . Other Other (See Comments)    "SEEDED" food due to stomach issues  . Statins Nausea And Vomiting  . Penicillins Hives and Rash    Has patient had a PCN reaction causing immediate rash, facial/tongue/throat swelling, SOB or lightheadedness with hypotension: Yes Has patient had a PCN reaction causing severe rash involving mucus membranes or skin necrosis: No Has patient had a PCN reaction that required hospitalization: No Has patient had a PCN reaction occurring within the last 10 years: Yes If all of the above answers are "NO", then may proceed with Cephalosporin use.     Medications: Outpatient Encounter Medications as of 08/05/2020  Medication Sig Note  . KLOR-CON M20 20 MEQ tablet TAKE 1 TABLET (20 MEQ TOTAL) BY MOUTH 3 (THREE) TIMES DAILY.   Marland Kitchen acetaminophen (TYLENOL) 500 MG tablet Take 500 mg by mouth every 6 (six) hours as needed for headache (pain).   Marland Kitchen albuterol (PROAIR HFA) 108 (90 BASE) MCG/ACT inhaler Inhale 2 puffs into the lungs every 6 (six) hours as needed for wheezing or shortness of breath.    . Alpha-D-Galactosidase (BEANO PO) Take 1-2 tablets by mouth daily as needed (for gas).   Marland Kitchen aspirin EC 81 MG tablet Take 1 tablet (81 mg total) by mouth daily.   Marland Kitchen azelastine (ASTELIN) 0.1 % nasal spray Place 2 sprays into both nostrils 2 (two) times daily as needed for rhinitis. Use in each nostril as directed   . blood glucose meter kit and supplies KIT Dispense based on patient and insurance preference. Use up to four times daily as  directed. (FOR ICD-10: E11.9).   . clonazePAM (KLONOPIN) 0.5 MG tablet TAKE 1/2 TO 1 TABLET TWICE A DAY AS NEEDED FOR ANXIETY (Patient taking differently: Take 0.5 mg by mouth 2 (two) times daily as needed for anxiety. )   . ezetimibe (ZETIA) 10 MG tablet TAKE ONE TABLET BY MOUTH ONCE DAILY   . glucose blood (ONETOUCH ULTRA) test strip USE UP TO 4 TIMES DAILY AS DIRECTED   . JARDIANCE 10 MG TABS tablet TAKE 1 TABLET BY MOUTH EVERY DAY (Patient taking differently: Take 10 mg by mouth daily. ) 06/02/2020: [employer] Cares PAP  . Lancets MISC Use to check blood sugars up to 4 times daily as directed. E11.9   . linaclotide (LINZESS) 145 MCG CAPS capsule Take 145 mcg by mouth daily before breakfast. PRN - samples   . lubiprostone ([name]) 24 MCG capsule TAKE 1 CAPSULE (24 MCG TOTAL) BY MOUTH 2 (TWO) TIMES DAILY WITH A MEAL. 06/02/2020: [employer]aked[employer] PAP  . meclizine (ANTIVERT) 12.5 MG tablet TAKE 1 TABLET (12.5 MG TOTAL) BY MOUTH 3 (THREE) TIMES DAILY AS NEEDED FOR DIZZINESS.   . Multiple Vitamin (MULTIVITAMIN WITH MINERALS) TABS tablet Take 1 tablet by mouth daily after lunch.   . nitroGLYCERIN (NITROSTAT) 0.4 MG SL tablet Place 1 tablet (0.4 mg total) under the tongue every 5 (five) minutes as needed for chest pain. 01/05/2020: 2 tablets on 7/2 and 2 tablets on 7/8  .  omeprazole (PRILOSEC) 20 MG capsule TAKE ONE CAPSULE BY MOUTH EVERY MORNING   . OVER THE COUNTER MEDICATION Place 1 drop into both eyes 2 (two) times daily as needed (dry eyes). Over the counter eye drop for itching   . OXcarbazepine (TRILEPTAL) 150 MG tablet TAKE 1 TABLET BY MOUTH TWICE A DAY (Patient taking differently: Take 150 mg by mouth 2 (two) times daily. )   . OZEMPIC, 0.25 OR 0.5 MG/DOSE, 2 MG/1.5ML SOPN Inject 0.375 mLs (0.5 mg total) into the skin once a week. 06/02/2020: [employer] Cares PAP  . Polyvinyl Alcohol-Povidone (REFRESH OP) Place 1 drop into both eyes daily as needed (dry eyes).   . pregabalin (LYRICA) 75 MG capsule TAKE 1 CAPSULE (75 MG  TOTAL) BY MOUTH 3 (THREE) TIMES DAILY.   . Probiotic Product (PROBIOTIC PO) Take 1 capsule by mouth daily after breakfast.    . rosuvastatin (CRESTOR) 5 MG tablet TAKE 1 TABLET BY MOUTH EVERY DAY   . sennosides-docusate sodium (SENOKOT-S) 8.6-50 MG tablet Take 1-2 tablets by mouth daily as needed for constipation.   . [DISCONTINUED] Calcium Carbonate (CALCIUM 500 PO) Take 1 capsule by mouth every other day.     No facility-administered encounter medications on file as of 08/05/2020.    Current Diagnosis: Patient Active Problem List   Diagnosis Date Noted  . Change in stool 01/23/2020  . Hypokalemia 01/06/2020  . Unstable angina ([place]) 01/05/2020  . CAD (coronary artery disease) 05/28/2018  . Neck pain 07/21/2017  . Dysuria 04/10/2017  . Decreased hearing of right ear 02/01/2017  . Primary osteoarthritis of left knee 01/12/2017  . Constipation 01/10/2017  . Hypercalcemia 10/12/2016  . Chest pain 10/12/2016  . DOE (dyspnea on exertion) 10/12/2016  . Chronic back pain 08/23/2016  . Hair loss 08/23/2016  . Diabetes ([place]) 07/26/2016  . Anxiety 09/14/2015  . Diverticulitis large intestine w/o perforation or abscess w/o bleeding 05/12/2014  . Essential hypertension 05/12/2014  . Diabetic neuropathy ([place]) 05/12/2014  . Seizure disorder ([place]) 05/12/2014  . Hepatic steatosis 05/12/2014  . Cerebrovascular disease, unspecified 03/13/2013  . Allergic rhinitis 03/13/2013  . GOITER, MULTINODULAR 05/18/2009  . VERTIGO 04/13/2009  . Irritable bowel syndrome 03/20/2009  . Dyslipidemia 03/18/2009  . GERD 03/18/2009    Goals Addressed   None     Follow-Up:  Coordination of Enhanced Pharmacy Services   Reviewed chart for medication changes ahead of medication coordination call.  No OVs, Consults, or hospital visits since last care coordination call/Pharmacist visit.  No medication changes indicated   BP Readings from Last 3 Encounters:  04/06/20 120/72  02/20/20 110/60  01/23/20  110/68    Lab Results  Component Value Date   HGBA1C 6.3 (H) 01/23/2020     Patient obtains medications through Vials  30 Days   Last adherence delivery included:  Ezetimibe 10 mg 1 tab daily Pregabalin 75 mg 1 cap three times daily Omeprazole 20 mg 1 cap every morning  Rosuvastatin 5 mg 1 tab daily at bedtime Oxcarbazepine 150 mg 1 tab every morning and bedtime  Patient is due for next adherence delivery on: 08/21/20. Called patient and reviewed medications and coordinated delivery.  This delivery to include: Ezetimibe 10 mg 1 tab daily Pregabalin 75 mg 1 cap three times daily Omeprazole 20 mg 1 cap every morning  Rosuvastatin 5 mg 1 tab daily at bedtime Oxcarbazepine 150 mg 1 tab every morning and bedtime   Confirmed delivery date of 08/21/2020, advised patient that pharmacy will contact them  the morning of delivery.   Wendy Poet, [place] [phone]

## 2020-08-14 ENCOUNTER — Other Ambulatory Visit: Payer: Self-pay | Admitting: Internal Medicine

## 2020-08-14 ENCOUNTER — Other Ambulatory Visit: Payer: Self-pay | Admitting: Neurology

## 2020-08-26 ENCOUNTER — Other Ambulatory Visit: Payer: Self-pay | Admitting: Internal Medicine

## 2020-08-27 ENCOUNTER — Other Ambulatory Visit: Payer: Self-pay | Admitting: Internal Medicine

## 2020-08-30 NOTE — Progress Notes (Signed)
Subjective:    Patient ID: [name], female    DOB: 10-Apr-1941, 80 y.o.   MRN: [id number]  HPI The patient is here for follow up of their chronic medical problems, including DM, diabetic neuropathy, CAD, hyperlipidemia, constipation, anxiety, gerd    She fell a while ago ( last year) and injured her shoulders.  She feels something is not right in her head.  When she holds her head down her right frontal region hurts and the pain runs down her face and all the way down her body.  She felt heaviness in her chest this morning.  She had heartburn this morning.    The numbness / neuropathy is terrible. Her neuropathy has gotten worse. She feels tingling and burning all over.   Her thinking does not feel as clear.  She can not always think about when she needs to do.  She worries about dementia.       Medications and allergies reviewed with patient and updated if appropriate.  Patient Active Problem List   Diagnosis Date Noted   Change in stool 01/23/2020   Hypokalemia 01/06/2020   Unstable angina ([place]) 01/05/2020   CAD (coronary artery disease) 05/28/2018   Neck pain 07/21/2017   Decreased hearing of right ear 02/01/2017   Primary osteoarthritis of left knee 01/12/2017   Constipation 01/10/2017   Hypercalcemia 10/12/2016   Chest pain 10/12/2016   DOE (dyspnea on exertion) 10/12/2016   Chronic back pain 08/23/2016   Hair loss 08/23/2016   Diabetes ([place]) 07/26/2016   Anxiety 09/14/2015   Diverticulitis large intestine w/o perforation or abscess w/o bleeding 05/12/2014   Essential hypertension 05/12/2014   Diabetic neuropathy ([place]) 05/12/2014   Seizure disorder ([place]) 05/12/2014   Hepatic steatosis 05/12/2014   Cerebrovascular disease, unspecified 03/13/2013   Allergic rhinitis 03/13/2013   GOITER, MULTINODULAR 05/18/2009   VERTIGO 04/13/2009   Irritable bowel syndrome 03/20/2009   Dyslipidemia 03/18/2009   GERD 03/18/2009     Current Outpatient Medications on File Prior to Visit  Medication Sig Dispense Refill   acetaminophen (TYLENOL) 500 MG tablet Take 500 mg by mouth every 6 (six) hours as needed for headache (pain).     albuterol (VENTOLIN HFA) 108 (90 Base) MCG/ACT inhaler Inhale 2 puffs into the lungs every 6 (six) hours as needed for wheezing or shortness of breath.     Alpha-D-Galactosidase (BEANO PO) Take 1-2 tablets by mouth daily as needed (for gas).     aspirin EC 81 MG tablet Take 1 tablet (81 mg total) by mouth daily. 90 tablet 3   azelastine (ASTELIN) 0.1 % nasal spray Place 2 sprays into both nostrils 2 (two) times daily as needed for rhinitis. Use in each nostril as directed     clonazePAM (KLONOPIN) 0.5 MG tablet TAKE 1/2 TO 1 TABLET TWICE A DAY AS NEEDED FOR ANXIETY (Patient taking differently: Take 0.5 mg by mouth 2 (two) times daily as needed for anxiety.) 60 tablet 0   ezetimibe (ZETIA) 10 MG tablet TAKE ONE TABLET BY MOUTH ONCE DAILY 90 tablet 1   glucose blood (ONETOUCH ULTRA) test strip USE UP TO 4 TIMES DAILY AS DIRECTED 400 strip 3   JARDIANCE 10 MG TABS tablet TAKE 1 TABLET BY MOUTH EVERY DAY 30 tablet 5   KLOR-CON M20 20 MEQ tablet TAKE 1 TABLET (20 MEQ TOTAL) BY MOUTH 3 (THREE) TIMES DAILY. 270 tablet 1   Lancets MISC Use to check blood sugars up to 4 times daily  as directed. E11.9 400 each 3   linaclotide (LINZESS) 145 MCG CAPS capsule Take 145 mcg by mouth daily before breakfast. PRN - samples     lubiprostone ([name]) 24 MCG capsule TAKE 1 CAPSULE (24 MCG TOTAL) BY MOUTH 2 (TWO) TIMES DAILY WITH A MEAL. 180 capsule 1   meclizine (ANTIVERT) 12.5 MG tablet TAKE 1 TABLET (12.5 MG TOTAL) BY MOUTH 3 (THREE) TIMES DAILY AS NEEDED FOR DIZZINESS. 30 tablet 0   Multiple Vitamin (MULTIVITAMIN WITH MINERALS) TABS tablet Take 1 tablet by mouth daily after lunch.     nitroGLYCERIN (NITROSTAT) 0.4 MG SL tablet Place 1 tablet (0.4 mg total) under the tongue every 5 (five) minutes  as needed for chest pain. 25 tablet 2   Nortriptyline HCl (PAMELOR PO) nortriptyline     omeprazole (PRILOSEC) 20 MG capsule TAKE ONE CAPSULE BY MOUTH EVERY MORNING 90 capsule 1   OVER THE COUNTER MEDICATION Place 1 drop into both eyes 2 (two) times daily as needed (dry eyes). Over the counter eye drop for itching     OXcarbazepine (TRILEPTAL) 150 MG tablet TAKE 1 TABLET BY MOUTH TWICE A DAY (Patient taking differently: Take 150 mg by mouth 2 (two) times daily.) 180 tablet 0   OZEMPIC, 0.25 OR 0.5 MG/DOSE, 2 MG/1.5ML SOPN Inject 0.375 mLs (0.5 mg total) into the skin once a week. 1.5 mL 2   Polyvinyl Alcohol-Povidone (REFRESH OP) Place 1 drop into both eyes daily as needed (dry eyes).     pregabalin (LYRICA) 75 MG capsule TAKE ONE CAPSULE BY MOUTH EVERY MORNING and TAKE ONE CAPSULE BY MOUTH AT NOON and TAKE ONE CAPSULE BY MOUTH EVERY EVENING 90 capsule 4   Probiotic Product (PROBIOTIC PO) Take 1 capsule by mouth daily after breakfast.     rosuvastatin (CRESTOR) 5 MG tablet TAKE ONE TABLET BY MOUTH EVERYDAY AT BEDTIME 90 tablet 4   sennosides-docusate sodium (SENOKOT-S) 8.6-50 MG tablet Take 1-2 tablets by mouth daily as needed for constipation.     [DISCONTINUED] Calcium Carbonate (CALCIUM 500 PO) Take 1 capsule by mouth every other day.      No current facility-administered medications on file prior to visit.    Past Medical History:  Diagnosis Date   Allergic rhinitis, cause unspecified 03/13/2013   Anxiety    Cerebrovascular disease, unspecified 03/13/2013   Atrophy and small vessel dz noted, MR brain 2009   Depression    Diabetes mellitus, type 2 ([place])    Diverticulosis of colon 03/2010 hosp   Dyslipidemia    GERD (gastroesophageal reflux disease)    Hypertension    Neuropathy    OSA on CPAP    Osteoarthritis of shoulder region    and Knee   Seizure disorder ([place])    onset 11 years ago; repeated 2013    Past Surgical History:  Procedure Laterality Date    ABDOMINAL HYSTERECTOMY  1970's   Partial   APPENDECTOMY     CHOLECYSTECTOMY     LEFT HEART CATH AND CORONARY ANGIOGRAPHY N/A 05/28/2018   Procedure: LEFT HEART CATH AND CORONARY ANGIOGRAPHY;  Surgeon: Belva Crome, MD;  Location: [facility];  Service: Cardiovascular;  Laterality: N/A;   LEFT HEART CATH AND CORONARY ANGIOGRAPHY N/A 01/06/2020   Procedure: LEFT HEART CATH AND CORONARY ANGIOGRAPHY;  Surgeon: Belva Crome, MD;  Location: [facility];  Service: Cardiovascular;  Laterality: N/A;   LUMBAR EPIDURAL INJECTION Left 08/25/2017   SHOULDER SURGERY  2008   LT, post  fall    TONSILLECTOMY AND ADENOIDECTOMY     TOTAL KNEE ARTHROPLASTY Left 01/12/2017   Procedure: LEFT TOTAL KNEE ARTHROPLASTY;  Surgeon: Susa Day, MD;  Location: [facility];  Service: Orthopedics;  Laterality: Left;  120 mins    Social History   Socioeconomic History   Marital status: Widowed    Spouse name: Not on file   Number of children: 2   Years of education: Not on file   Highest education level: Not on file  Occupational History   Occupation: retired  Tobacco Use   Smoking status: Former Smoker    Quit date: 10/19/1985    Years since quitting: 34.8   Smokeless tobacco: Never Used  Vaping Use   Vaping Use: Never used  Substance and Sexual Activity   Alcohol use: Yes    Alcohol/week: 0.0 standard drinks    Comment: occasionally    Drug use: No   Sexual activity: Never  Other Topics Concern   Not on file  Social History Narrative   Patient lives in a one story home.  Has 2 children.  Retired from [employer].   Social Determinants of Health   Financial Resource Strain: Medium Risk   Difficulty of Paying Living Expenses: Somewhat hard  Food Insecurity: No Food Insecurity   Worried About Charity fundraiser in the Last Year: Never true   Ran Out of Food in the Last Year: Never true  Transportation Needs: No Transportation Needs   Lack of Transportation  (Medical): No   Lack of Transportation (Non-Medical): No  Physical Activity: Not on file  Stress: Not on file  Social Connections: Not on file    Family History  Problem Relation Age of Onset   Arthritis Mother    Heart disease Father    Arthritis Other        Grandmother   Diabetes Other        Grandmother   Colon cancer Neg Hx    Stomach cancer Neg Hx    Pancreatic cancer Neg Hx     Review of Systems  Constitutional: Negative for fever.  HENT: Positive for tinnitus.   Respiratory: Positive for shortness of breath. Negative for cough and wheezing.   Cardiovascular: Positive for palpitations (occ). Negative for chest pain and leg swelling.  Musculoskeletal: Positive for arthralgias.  Neurological: Positive for headaches.  Psychiatric/Behavioral: Positive for sleep disturbance.       Objective:   Vitals:   08/31/20 1125  BP: 116/76  Pulse: 80  Temp: 98.6 F (37 C)  SpO2: 95%   BP Readings from Last 3 Encounters:  08/31/20 116/76  04/06/20 120/72  02/20/20 110/60   Wt Readings from Last 3 Encounters:  08/31/20 163 lb (73.9 kg)  04/06/20 163 lb (73.9 kg)  02/20/20 159 lb 12.8 oz (72.5 kg)   Body mass index is 29.81 kg/m.   Physical Exam    Constitutional: Appears well-developed and well-nourished. No distress.  HENT:  Head: Normocephalic and atraumatic.  Neck: Neck supple. No tracheal deviation present. No thyromegaly present.  No cervical lymphadenopathy Cardiovascular: Normal rate, regular rhythm and normal heart sounds.   No murmur heard. No carotid bruit .  No edema Pulmonary/Chest: Effort normal and breath sounds normal. No respiratory distress. No has no wheezes. No rales.  Skin: Skin is warm and dry. Not diaphoretic.  Psychiatric: Normal mood and affect. Behavior is normal.      Assessment & Plan:    See Problem List for Assessment  and Plan of chronic medical problems.    This visit occurred during the SARS-CoV-2 public health  emergency.  Safety protocols were in place, including screening questions prior to the visit, additional usage of staff PPE, and extensive cleaning of exam room while observing appropriate contact time as indicated for disinfecting solutions.

## 2020-08-30 NOTE — Patient Instructions (Addendum)
  Blood work was ordered.     Medications changes include :   Restart omeprazole  Your prescription(s) have been submitted to your pharmacy. Please take as directed and contact our office if you believe you are having problem(s) with the medication(s).   A referral was ordered for a neuropyschologist for memory testing.       Someone from their office will call you to schedule an appointment.    A Ct of your head was ordered.     Follow up with Dr Posey Pronto for your seizures.     Please followup in 6 months

## 2020-08-31 ENCOUNTER — Ambulatory Visit (INDEPENDENT_AMBULATORY_CARE_PROVIDER_SITE_OTHER): Payer: HMO | Admitting: Internal Medicine

## 2020-08-31 ENCOUNTER — Encounter: Payer: Self-pay | Admitting: Internal Medicine

## 2020-08-31 ENCOUNTER — Other Ambulatory Visit: Payer: Self-pay

## 2020-08-31 ENCOUNTER — Telehealth: Payer: Self-pay | Admitting: Pharmacist

## 2020-08-31 VITALS — BP 116/76 | HR 80 | Temp 98.6°F | Ht 62.0 in | Wt 163.0 lb

## 2020-08-31 DIAGNOSIS — F419 Anxiety disorder, unspecified: Secondary | ICD-10-CM

## 2020-08-31 DIAGNOSIS — E785 Hyperlipidemia, unspecified: Secondary | ICD-10-CM

## 2020-08-31 DIAGNOSIS — S0990XA Unspecified injury of head, initial encounter: Secondary | ICD-10-CM | POA: Diagnosis not present

## 2020-08-31 DIAGNOSIS — R413 Other amnesia: Secondary | ICD-10-CM | POA: Diagnosis not present

## 2020-08-31 DIAGNOSIS — E1142 Type 2 diabetes mellitus with diabetic polyneuropathy: Secondary | ICD-10-CM | POA: Diagnosis not present

## 2020-08-31 DIAGNOSIS — K219 Gastro-esophageal reflux disease without esophagitis: Secondary | ICD-10-CM | POA: Diagnosis not present

## 2020-08-31 DIAGNOSIS — I2510 Atherosclerotic heart disease of native coronary artery without angina pectoris: Secondary | ICD-10-CM

## 2020-08-31 DIAGNOSIS — I10 Essential (primary) hypertension: Secondary | ICD-10-CM

## 2020-08-31 DIAGNOSIS — G40909 Epilepsy, unspecified, not intractable, without status epilepticus: Secondary | ICD-10-CM

## 2020-08-31 DIAGNOSIS — K5900 Constipation, unspecified: Secondary | ICD-10-CM

## 2020-08-31 LAB — MICROALBUMIN / CREATININE URINE RATIO
Creatinine,U: 74.8 mg/dL
Microalb Creat Ratio: 4.2 mg/g (ref 0.0–30.0)
Microalb, Ur: 3.1 mg/dL — ABNORMAL HIGH (ref 0.0–1.9)

## 2020-08-31 LAB — COMPREHENSIVE METABOLIC PANEL
ALT: 37 U/L — ABNORMAL HIGH (ref 0–35)
AST: 33 U/L (ref 0–37)
Albumin: 4.3 g/dL (ref 3.5–5.2)
Alkaline Phosphatase: 73 U/L (ref 39–117)
BUN: 14 mg/dL (ref 6–23)
CO2: 31 mEq/L (ref 19–32)
Calcium: 10.4 mg/dL (ref 8.4–10.5)
Chloride: 100 mEq/L (ref 96–112)
Creatinine, Ser: 0.76 mg/dL (ref 0.40–1.20)
GFR: 75.44 mL/min (ref >60.00)
Glucose, Bld: 118 mg/dL — ABNORMAL HIGH (ref 70–99)
Potassium: 3.9 mEq/L (ref 3.5–5.1)
Sodium: 139 mEq/L (ref 135–145)
Total Bilirubin: 0.3 mg/dL (ref 0.2–1.2)
Total Protein: 7.5 g/dL (ref 6.0–8.3)

## 2020-08-31 LAB — CBC WITH DIFFERENTIAL/PLATELET
Basophils Absolute: 0.1 10*3/uL (ref 0.0–0.1)
Basophils Relative: 1.1 % (ref 0.0–3.0)
Eosinophils Absolute: 0.1 10*3/uL (ref 0.0–0.7)
Eosinophils Relative: 2.2 % (ref 0.0–5.0)
HCT: 44.7 % (ref 36.0–46.0)
Hemoglobin: 14.9 g/dL (ref 12.0–15.0)
Lymphocytes Relative: 47.3 % — ABNORMAL HIGH (ref 12.0–46.0)
Lymphs Abs: 3 10*3/uL (ref 0.7–4.0)
MCHC: 33.3 g/dL (ref 30.0–36.0)
MCV: 92.1 fl (ref 78.0–100.0)
Monocytes Absolute: 0.5 10*3/uL (ref 0.1–1.0)
Monocytes Relative: 7.8 % (ref 3.0–12.0)
Neutro Abs: 2.6 10*3/uL (ref 1.4–7.7)
Neutrophils Relative %: 41.6 % — ABNORMAL LOW (ref 43.0–77.0)
Platelets: 215 10*3/uL (ref 150.0–400.0)
RBC: 4.85 Mil/uL (ref 3.87–5.11)
RDW: 13.8 % (ref 11.5–15.5)
WBC: 6.3 10*3/uL (ref 4.0–10.5)

## 2020-08-31 LAB — VITAMIN B12: Vitamin B-12: 1013 pg/mL — ABNORMAL HIGH (ref 211–911)

## 2020-08-31 LAB — LIPID PANEL
Cholesterol: 148 mg/dL (ref 0–200)
HDL: 68 mg/dL (ref >39.00)
LDL Cholesterol: 61 mg/dL (ref 0–99)
NonHDL: 79.64
Total CHOL/HDL Ratio: 2
Triglycerides: 94 mg/dL (ref 0.0–149.0)
VLDL: 18.8 mg/dL (ref 0.0–40.0)

## 2020-08-31 LAB — HEMOGLOBIN A1C: Hgb A1c MFr Bld: 7.4 % — ABNORMAL HIGH (ref 4.6–6.5)

## 2020-08-31 MED ORDER — OMEPRAZOLE 20 MG PO CPDR: 20.0000 mg | DELAYED_RELEASE_CAPSULE | Freq: Every morning | ORAL | 1 refills | Status: DC

## 2020-08-31 MED ORDER — OXCARBAZEPINE 150 MG PO TABS: 150.0000 mg | ORAL_TABLET | Freq: Two times a day (BID) | ORAL | 0 refills | Status: DC

## 2020-08-31 NOTE — Assessment & Plan Note (Signed)
Chronic Fairly controlled Continue [name] 24 mcg twice daily

## 2020-08-31 NOTE — Assessment & Plan Note (Signed)
Chronic Check lipids, CMP Continue Zetia 10 mg daily and Crestor 5 mg daily

## 2020-08-31 NOTE — Assessment & Plan Note (Signed)
Chronic Lab Results  Component Value Date   HGBA1C 6.3 (H) 01/23/2020   Sugars have been controlled Continue Ozempic 0.5 mg weekly, Jardiance daily Check A1c, urine microalbumin

## 2020-08-31 NOTE — Assessment & Plan Note (Signed)
Acute She is concerned about her memory and worries about dementia I agree that she does need a full evaluation-she could have some memory issues She is independent with her ADLs.  She lives with her son, but he does travel a lot and she is home alone a lot of the time. Will refer to neuropsychology CT of the head Vitamin B12 level

## 2020-08-31 NOTE — Assessment & Plan Note (Signed)
Chronic Controlled, stable Continue clonazepam 0.25 mg as needed only-encouraged her to use this the least amount possible.  She does not take it often

## 2020-08-31 NOTE — Assessment & Plan Note (Signed)
Chronic GERD has not been controlled-she is not taking the omeprazole for some reason, but states when she was taking that it was well controlled Restart omeprazole 20 mg daily-refill sent to pharmacy

## 2020-08-31 NOTE — Assessment & Plan Note (Signed)
Chronic This has gotten worse per patient Check B12 level Continue Lyrica 75 mg 3 times daily Has not tolerated Cymbalta or Effexor.  Gabapentin and nortriptyline were not effective

## 2020-08-31 NOTE — Assessment & Plan Note (Signed)
Chronic Has been following with Dr. Posey Pronto, but has not seen her in a while She is currently on medication-no recent seizure activity I will refill her Trileptal 150 mg twice daily for now Advised that she needs to follow-up with Dr. Posey Pronto for further refills

## 2020-08-31 NOTE — Assessment & Plan Note (Signed)
Chronic Following with cardiology No symptoms suggestive of angina Continue aspirin 81 mg daily, Zetia 10 mg daily, Crestor 5 mg daily CBC, CMP, lipids

## 2020-08-31 NOTE — Telephone Encounter (Signed)
Patient presented for PCP visit today and asked to see pharmacist to discuss patient assistance.  Patient has [employer] and reports copays for Ozempic, Jardiance and [name] are cost prohibitive at this time.  Reviewed application process for [employer], [place] at [employer] patient assistance program. Patient meets income/out of pocket spend criteria for the program. Patient provided proof of income and signed applications. Will collaborate with prescriber Dr Quay Burow for the provider portion of application. Once completed, application will be submitted via Fax    Charlton Haws, [facility]

## 2020-08-31 NOTE — Assessment & Plan Note (Signed)
Subacute She fell sometime last year and thinks she injured her head in addition to her shoulders and neck Still having chronic neck and shoulder issues-has seen orthopedics Has been experiencing since that time right frontal head pain that radiates down her body-does not make much neurological sense, but given that there was some head trauma I will go ahead and get a CT scan She also mentions some memory concerns and this will also help evaluate that as well

## 2020-09-01 ENCOUNTER — Other Ambulatory Visit: Payer: Self-pay | Admitting: Internal Medicine

## 2020-09-01 ENCOUNTER — Encounter: Payer: Self-pay | Admitting: Psychology

## 2020-09-01 MED ORDER — EMPAGLIFLOZIN 25 MG PO TABS: 25.0000 mg | ORAL_TABLET | Freq: Every day | ORAL | 1 refills | Status: DC

## 2020-09-11 ENCOUNTER — Ambulatory Visit (INDEPENDENT_AMBULATORY_CARE_PROVIDER_SITE_OTHER)
Admission: RE | Admit: 2020-09-11 | Discharge: 2020-09-11 | Disposition: A | Discharge status: Home / Self Care | Payer: HMO | Source: Ambulatory Visit | Attending: Internal Medicine | Admitting: Internal Medicine

## 2020-09-11 ENCOUNTER — Other Ambulatory Visit: Payer: Self-pay

## 2020-09-11 DIAGNOSIS — R42 Dizziness and giddiness: Secondary | ICD-10-CM | POA: Diagnosis not present

## 2020-09-11 DIAGNOSIS — S0990XA Unspecified injury of head, initial encounter: Secondary | ICD-10-CM | POA: Diagnosis not present

## 2020-09-15 ENCOUNTER — Telehealth: Payer: Self-pay | Admitting: Internal Medicine

## 2020-09-15 ENCOUNTER — Telehealth: Payer: Self-pay | Admitting: Pharmacist

## 2020-09-15 NOTE — Progress Notes (Signed)
Chronic Care Management Pharmacy Assistant   Name: [name]  MRN: [id number] DOB: [birth year]   Reason for Encounter: Medication Coordination Call    Recent office visits:  08/31/20 Dr. Quay Burow  Recent consult visits:  None ID  Hospital visits:  None in previous 6 months  Medications: Outpatient Encounter Medications as of 09/15/2020  Medication Sig Note  . acetaminophen (TYLENOL) 500 MG tablet Take 500 mg by mouth every 6 (six) hours as needed for headache (pain).   Marland Kitchen albuterol (VENTOLIN HFA) 108 (90 Base) MCG/ACT inhaler Inhale 2 puffs into the lungs every 6 (six) hours as needed for wheezing or shortness of breath.   . Alpha-D-Galactosidase (BEANO PO) Take 1-2 tablets by mouth daily as needed (for gas).   Marland Kitchen aspirin EC 81 MG tablet Take 1 tablet (81 mg total) by mouth daily.   Marland Kitchen azelastine (ASTELIN) 0.1 % nasal spray Place 2 sprays into both nostrils 2 (two) times daily as needed for rhinitis. Use in each nostril as directed   . clonazePAM (KLONOPIN) 0.5 MG tablet TAKE 1/2 TO 1 TABLET TWICE A DAY AS NEEDED FOR ANXIETY (Patient taking differently: Take 0.5 mg by mouth 2 (two) times daily as needed for anxiety.)   . empagliflozin (JARDIANCE) 25 MG TABS tablet Take 1 tablet (25 mg total) by mouth daily before breakfast.   . ezetimibe (ZETIA) 10 MG tablet TAKE ONE TABLET BY MOUTH ONCE DAILY   . glucose blood (ONETOUCH ULTRA) test strip USE UP TO 4 TIMES DAILY AS DIRECTED   . KLOR-CON M20 20 MEQ tablet TAKE 1 TABLET (20 MEQ TOTAL) BY MOUTH 3 (THREE) TIMES DAILY.   Marland Kitchen Lancets MISC Use to check blood sugars up to 4 times daily as directed. E11.9   . lubiprostone ([name]) 24 MCG capsule TAKE 1 CAPSULE (24 MCG TOTAL) BY MOUTH 2 (TWO) TIMES DAILY WITH A MEAL. 06/02/2020: [employer] PAP  . meclizine (ANTIVERT) 12.5 MG tablet TAKE 1 TABLET (12.5 MG TOTAL) BY MOUTH 3 (THREE) TIMES DAILY AS NEEDED FOR DIZZINESS.   . Multiple Vitamin (MULTIVITAMIN WITH MINERALS) TABS tablet Take 1 tablet  by mouth daily after lunch.   . nitroGLYCERIN (NITROSTAT) 0.4 MG SL tablet Place 1 tablet (0.4 mg total) under the tongue every 5 (five) minutes as needed for chest pain. 01/05/2020: 2 tablets on 7/2 and 2 tablets on 7/8  . omeprazole (PRILOSEC) 20 MG capsule Take 1 capsule (20 mg total) by mouth every morning.   Marland Kitchen OVER THE COUNTER MEDICATION Place 1 drop into both eyes 2 (two) times daily as needed (dry eyes). Over the counter eye drop for itching   . OXcarbazepine (TRILEPTAL) 150 MG tablet Take 1 tablet (150 mg total) by mouth 2 (two) times daily.   Marland Kitchen OZEMPIC, 0.25 OR 0.5 MG/DOSE, 2 MG/1.5ML SOPN Inject 0.375 mLs (0.5 mg total) into the skin once a week. 06/02/2020: [employer] PAP  . Polyvinyl Alcohol-Povidone (REFRESH OP) Place 1 drop into both eyes daily as needed (dry eyes).   . pregabalin (LYRICA) 75 MG capsule TAKE ONE CAPSULE BY MOUTH EVERY MORNING and TAKE ONE CAPSULE BY MOUTH AT NOON and TAKE ONE CAPSULE BY MOUTH EVERY EVENING   . Probiotic Product (PROBIOTIC PO) Take 1 capsule by mouth daily after breakfast.   . rosuvastatin (CRESTOR) 5 MG tablet TAKE ONE TABLET BY MOUTH EVERYDAY AT BEDTIME   . sennosides-docusate sodium (SENOKOT-S) 8.6-50 MG tablet Take 1-2 tablets by mouth daily as needed for constipation.   . [  DISCONTINUED] Calcium Carbonate (CALCIUM 500 PO) Take 1 capsule by mouth every other day.     No facility-administered encounter medications on file as of 09/15/2020.    Reviewed chart for medication changes ahead of medication coordination call.  No OVs, Consults, or hospital visits since last care coordination call/Pharmacist visit. (If appropriate, list visit date, provider name)  No medication changes indicated OR if recent visit, treatment plan here.  BP Readings from Last 3 Encounters:  08/31/20 116/76  04/06/20 120/72  02/20/20 110/60    Lab Results  Component Value Date   HGBA1C 7.4 (H) 08/31/2020     Patient obtains medications through Vials  30 Days   Last  adherence delivery included:  Ezetimibe 10 mg 1 tab daily Pregabalin 75 mg 1 cap three times daily Omeprazole 20 mg 1 cap every morning  Rosuvastatin 5 mg 1 tab daily at bedtime Oxcarbazepine 150 mg 1 tab every morning and bedtime    Patient is due for next adherence delivery on: 09/21/20. Called patient and reviewed medications and coordinated delivery.  This delivery to include: Ezetimibe 10 mg 1 tab daily Pregabalin 75 mg 1 cap three times daily Omeprazole 20 mg 1 cap every morning  Rosuvastatin 5 mg 1 tab daily at bedtime   Patient declined the following medications Jardiance and Oxcarbazepine has enough on hand   Confirmed delivery date of 09/21/20, advised patient that pharmacy will contact them the morning of delivery.   Wendy Poet, [place] [phone]   Time spent:40 minutes, Telephone call, review chart notes and documention

## 2020-09-15 NOTE — Telephone Encounter (Signed)
Is her pain only occurring when she puts her head down or is it all the time?    The pain could be coming from her neck and radiating up to her head or could be a rebound headache from taking tylenol daily.

## 2020-09-15 NOTE — Telephone Encounter (Signed)
Spoke with patient today.  She notes she does feel the pain when she puts her head down.  If pain is coming from her neck she is wanting to know what she can do.  Also instead of taking Tylenol she wanted to know what else you might recommend regarding the headaches.

## 2020-09-15 NOTE — Telephone Encounter (Signed)
-----   Message from Marcina Millard, [place] sent at 09/14/2020  5:06 PM EDT ----- Results given to patient. She wants to know what she can take for the pain in her head. She is taking Tylenol not helping and Aleve upsets her stomach.   Please advise

## 2020-09-16 NOTE — Telephone Encounter (Signed)
Her headache could be sinus related.  Any congestion or sinus pressure?  Has she tried using her nasal spray?  She could try some Coricidin cold products to see if that helps with some of the sinus congestion if she is having any.

## 2020-09-17 NOTE — Telephone Encounter (Signed)
Received fax from [facility] that [name] has been approved through 06/26/21.  Received fax from [place] that [name] has been approved through 06/26/21.  Still awaiting approval from [employer] regarding Ozempic approval.

## 2020-09-17 NOTE — Telephone Encounter (Signed)
Spoke with patient today and recommendations given. She will try Coricidin to see if she can tell a difference. She does take allergy/sinus pills but will see if this helps.

## 2020-09-24 ENCOUNTER — Telehealth: Payer: Self-pay | Admitting: Pharmacist

## 2020-09-24 NOTE — Progress Notes (Signed)
A call was made to [facility] to check on the status of the patient's patient assistance application for Ozempic.  Spoke with representative Darylene Price) who had to update the provider's [employer] number which had expired. Representative went ahead and processed the application and the patient has been approved through 06/26/21. The rep stated that it will take 10-14 business days to be delivered to Dr. Quay Burow office. The patient will have 2 orders remaining.   [place] Pharmacist Assistant [phone]

## 2020-09-24 NOTE — Telephone Encounter (Cosign Needed)
Error

## 2020-10-01 DIAGNOSIS — E119 Type 2 diabetes mellitus without complications: Secondary | ICD-10-CM | POA: Diagnosis not present

## 2020-10-01 DIAGNOSIS — H52203 Unspecified astigmatism, bilateral: Secondary | ICD-10-CM | POA: Diagnosis not present

## 2020-10-01 DIAGNOSIS — Z961 Presence of intraocular lens: Secondary | ICD-10-CM | POA: Diagnosis not present

## 2020-10-01 DIAGNOSIS — H524 Presbyopia: Secondary | ICD-10-CM | POA: Diagnosis not present

## 2020-10-01 DIAGNOSIS — H25812 Combined forms of age-related cataract, left eye: Secondary | ICD-10-CM | POA: Diagnosis not present

## 2020-10-01 DIAGNOSIS — H5213 Myopia, bilateral: Secondary | ICD-10-CM | POA: Diagnosis not present

## 2020-10-06 ENCOUNTER — Telehealth: Payer: Self-pay | Admitting: Pharmacist

## 2020-10-08 NOTE — Progress Notes (Signed)
Chronic Care Management Pharmacy Assistant   Name: [name]  MRN: [id number] DOB: 12/24/1940   Reason for Encounter: Medication Coordination Call    Recent office visits:  None ID  Recent consult visits:  None ID  Hospital visits:  None in previous 6 months  Medications: Outpatient Encounter Medications as of 10/06/2020  Medication Sig Note  . acetaminophen (TYLENOL) 500 MG tablet Take 500 mg by mouth every 6 (six) hours as needed for headache (pain).   Marland Kitchen albuterol (VENTOLIN HFA) 108 (90 Base) MCG/ACT inhaler Inhale 2 puffs into the lungs every 6 (six) hours as needed for wheezing or shortness of breath.   . Alpha-D-Galactosidase (BEANO PO) Take 1-2 tablets by mouth daily as needed (for gas).   Marland Kitchen aspirin EC 81 MG tablet Take 1 tablet (81 mg total) by mouth daily.   Marland Kitchen azelastine (ASTELIN) 0.1 % nasal spray Place 2 sprays into both nostrils 2 (two) times daily as needed for rhinitis. Use in each nostril as directed   . clonazePAM (KLONOPIN) 0.5 MG tablet TAKE 1/2 TO 1 TABLET TWICE A DAY AS NEEDED FOR ANXIETY (Patient taking differently: Take 0.5 mg by mouth 2 (two) times daily as needed for anxiety.)   . empagliflozin (JARDIANCE) 25 MG TABS tablet Take 1 tablet (25 mg total) by mouth daily before breakfast.   . ezetimibe (ZETIA) 10 MG tablet TAKE ONE TABLET BY MOUTH ONCE DAILY   . glucose blood (ONETOUCH ULTRA) test strip USE UP TO 4 TIMES DAILY AS DIRECTED   . KLOR-CON M20 20 MEQ tablet TAKE 1 TABLET (20 MEQ TOTAL) BY MOUTH 3 (THREE) TIMES DAILY.   Marland Kitchen Lancets MISC Use to check blood sugars up to 4 times daily as directed. E11.9   . lubiprostone ([name]) 24 MCG capsule TAKE 1 CAPSULE (24 MCG TOTAL) BY MOUTH 2 (TWO) TIMES DAILY WITH A MEAL. 06/02/2020: [employer] PAP  . meclizine (ANTIVERT) 12.5 MG tablet TAKE 1 TABLET (12.5 MG TOTAL) BY MOUTH 3 (THREE) TIMES DAILY AS NEEDED FOR DIZZINESS.   . Multiple Vitamin (MULTIVITAMIN WITH MINERALS) TABS tablet Take 1 tablet by mouth  daily after lunch.   . nitroGLYCERIN (NITROSTAT) 0.4 MG SL tablet Place 1 tablet (0.4 mg total) under the tongue every 5 (five) minutes as needed for chest pain. 01/05/2020: 2 tablets on 7/2 and 2 tablets on 7/8  . omeprazole (PRILOSEC) 20 MG capsule Take 1 capsule (20 mg total) by mouth every morning.   Marland Kitchen OVER THE COUNTER MEDICATION Place 1 drop into both eyes 2 (two) times daily as needed (dry eyes). Over the counter eye drop for itching   . OXcarbazepine (TRILEPTAL) 150 MG tablet Take 1 tablet (150 mg total) by mouth 2 (two) times daily.   Marland Kitchen OZEMPIC, 0.25 OR 0.5 MG/DOSE, 2 MG/1.5ML SOPN Inject 0.375 mLs (0.5 mg total) into the skin once a week. 06/02/2020: [facility] PAP  . Polyvinyl Alcohol-Povidone (REFRESH OP) Place 1 drop into both eyes daily as needed (dry eyes).   . pregabalin (LYRICA) 75 MG capsule TAKE ONE CAPSULE BY MOUTH EVERY MORNING and TAKE ONE CAPSULE BY MOUTH AT NOON and TAKE ONE CAPSULE BY MOUTH EVERY EVENING   . Probiotic Product (PROBIOTIC PO) Take 1 capsule by mouth daily after breakfast.   . rosuvastatin (CRESTOR) 5 MG tablet TAKE ONE TABLET BY MOUTH EVERYDAY AT BEDTIME   . sennosides-docusate sodium (SENOKOT-S) 8.6-50 MG tablet Take 1-2 tablets by mouth daily as needed for constipation.    No  facility-administered encounter medications on file as of 10/06/2020.    Reviewed chart for medication changes ahead of medication coordination call.  No OVs, Consults, or hospital visits since last care coordination call/Pharmacist visit. (If appropriate, list visit date, provider name)  No medication changes indicated OR if recent visit, treatment plan here.  BP Readings from Last 3 Encounters:  08/31/20 116/76  04/06/20 120/72  02/20/20 110/60    Lab Results  Component Value Date   HGBA1C 7.4 (H) 08/31/2020     Patient obtains medications through Vials  30 Days   Last adherence delivery included:  Ezetimibe 10 mg 1 tab daily Pregabalin 75 mg 1 cap three times  daily Omeprazole 20 mg 1 cap every morning  Rosuvastatin 5 mg 1 tab daily at bedtime  Patient is due for next adherence delivery on: 10/21/20. Called patient and reviewed medications and coordinated delivery.  This delivery to include: Ezetimibe 10 mg 1 tab daily Pregabalin 75 mg 1 cap three times daily Omeprazole 20 mg 1 cap every morning  Rosuvastatin 5 mg 1 tab daily at bedtime   Patient declined the following medications jardiance, has plenty on hand for this month   Confirmed delivery date of 10/21/20, advised patient that pharmacy will contact them the morning of delivery.   [place] Pharmacist Assistant [phone]  Time spent:21

## 2020-10-12 ENCOUNTER — Telehealth: Payer: Self-pay

## 2020-10-12 NOTE — Telephone Encounter (Signed)
Spoke with patient today. She will come by this week to pick up her medication.  Medication placed in fridge with immunizations fridge in lab area.

## 2020-10-15 NOTE — Progress Notes (Signed)
Spoke with patient to remind her that 2 weeks before she runs out of her Jardiance she needs to call [facility] (gave her the number) to get refills sent to her home. Patient states that she understands and will call them before she runs out of medication.  Wendy Poet, CPA  Time spent:5

## 2020-10-23 NOTE — Progress Notes (Signed)
Chronic Care Management Pharmacy Assistant   Name: Valerye Kobus  MRN: [id number] DOB: [birth year]    Reason for Encounter: Chart Review    Medications: Outpatient Encounter Medications as of 10/06/2020  Medication Sig Note  . acetaminophen (TYLENOL) 500 MG tablet Take 500 mg by mouth every 6 (six) hours as needed for headache (pain).   Marland Kitchen albuterol (VENTOLIN HFA) 108 (90 Base) MCG/ACT inhaler Inhale 2 puffs into the lungs every 6 (six) hours as needed for wheezing or shortness of breath.   . Alpha-D-Galactosidase (BEANO PO) Take 1-2 tablets by mouth daily as needed (for gas).   Marland Kitchen aspirin EC 81 MG tablet Take 1 tablet (81 mg total) by mouth daily.   Marland Kitchen azelastine (ASTELIN) 0.1 % nasal spray Place 2 sprays into both nostrils 2 (two) times daily as needed for rhinitis. Use in each nostril as directed   . clonazePAM (KLONOPIN) 0.5 MG tablet TAKE 1/2 TO 1 TABLET TWICE A DAY AS NEEDED FOR ANXIETY (Patient taking differently: Take 0.5 mg by mouth 2 (two) times daily as needed for anxiety.)   . empagliflozin (JARDIANCE) 25 MG TABS tablet Take 1 tablet (25 mg total) by mouth daily before breakfast.   . ezetimibe (ZETIA) 10 MG tablet TAKE ONE TABLET BY MOUTH ONCE DAILY   . glucose blood (ONETOUCH ULTRA) test strip USE UP TO 4 TIMES DAILY AS DIRECTED   . KLOR-CON M20 20 MEQ tablet TAKE 1 TABLET (20 MEQ TOTAL) BY MOUTH 3 (THREE) TIMES DAILY.   Marland Kitchen Lancets MISC Use to check blood sugars up to 4 times daily as directed. E11.9   . lubiprostone ([name]) 24 MCG capsule TAKE 1 CAPSULE (24 MCG TOTAL) BY MOUTH 2 (TWO) TIMES DAILY WITH A MEAL. 06/02/2020: [employer] PAP  . meclizine (ANTIVERT) 12.5 MG tablet TAKE 1 TABLET (12.5 MG TOTAL) BY MOUTH 3 (THREE) TIMES DAILY AS NEEDED FOR DIZZINESS.   . Multiple Vitamin (MULTIVITAMIN WITH MINERALS) TABS tablet Take 1 tablet by mouth daily after lunch.   . nitroGLYCERIN (NITROSTAT) 0.4 MG SL tablet Place 1 tablet (0.4 mg total) under the tongue every 5 (five)  minutes as needed for chest pain. 01/05/2020: 2 tablets on 7/2 and 2 tablets on 7/8  . omeprazole (PRILOSEC) 20 MG capsule Take 1 capsule (20 mg total) by mouth every morning.   Marland Kitchen OVER THE COUNTER MEDICATION Place 1 drop into both eyes 2 (two) times daily as needed (dry eyes). Over the counter eye drop for itching   . OXcarbazepine (TRILEPTAL) 150 MG tablet Take 1 tablet (150 mg total) by mouth 2 (two) times daily.   Marland Kitchen OZEMPIC, 0.25 OR 0.5 MG/DOSE, 2 MG/1.5ML SOPN Inject 0.375 mLs (0.5 mg total) into the skin once a week. 06/02/2020: [facility] [facility] PAP  . Polyvinyl Alcohol-Povidone (REFRESH OP) Place 1 drop into both eyes daily as needed (dry eyes).   . pregabalin (LYRICA) 75 MG capsule TAKE ONE CAPSULE BY MOUTH EVERY MORNING and TAKE ONE CAPSULE BY MOUTH AT NOON and TAKE ONE CAPSULE BY MOUTH EVERY EVENING   . Probiotic Product (PROBIOTIC PO) Take 1 capsule by mouth daily after breakfast.   . rosuvastatin (CRESTOR) 5 MG tablet TAKE ONE TABLET BY MOUTH EVERYDAY AT BEDTIME   . sennosides-docusate sodium (SENOKOT-S) 8.6-50 MG tablet Take 1-2 tablets by mouth daily as needed for constipation.    No facility-administered encounter medications on file as of 10/06/2020.    Reviewed chart for medication changes and adherence.   No gaps in  adherence identified. Patient has follow up scheduled with pharmacy team. No further action required.  [place] Pharmacist Assistant [phone]  Time spent:9

## 2020-11-02 ENCOUNTER — Telehealth: Payer: Self-pay | Admitting: Pharmacist

## 2020-11-02 DIAGNOSIS — I2510 Atherosclerotic heart disease of native coronary artery without angina pectoris: Secondary | ICD-10-CM

## 2020-11-02 NOTE — Progress Notes (Addendum)
Chronic Care Management Pharmacy Assistant   Name: [name]  MRN: [id number] DOB: Nov 13, 1940   Reason for Encounter: Medication Coordination Call    Medications: Outpatient Encounter Medications as of 11/02/2020  Medication Sig Note   acetaminophen (TYLENOL) 500 MG tablet Take 500 mg by mouth every 6 (six) hours as needed for headache (pain).    albuterol (VENTOLIN HFA) 108 (90 Base) MCG/ACT inhaler Inhale 2 puffs into the lungs every 6 (six) hours as needed for wheezing or shortness of breath.    Alpha-D-Galactosidase (BEANO PO) Take 1-2 tablets by mouth daily as needed (for gas).    aspirin EC 81 MG tablet Take 1 tablet (81 mg total) by mouth daily.    azelastine (ASTELIN) 0.1 % nasal spray Place 2 sprays into both nostrils 2 (two) times daily as needed for rhinitis. Use in each nostril as directed    clonazePAM (KLONOPIN) 0.5 MG tablet TAKE 1/2 TO 1 TABLET TWICE A DAY AS NEEDED FOR ANXIETY (Patient taking differently: Take 0.5 mg by mouth 2 (two) times daily as needed for anxiety.)    empagliflozin (JARDIANCE) 25 MG TABS tablet Take 1 tablet (25 mg total) by mouth daily before breakfast.    ezetimibe (ZETIA) 10 MG tablet TAKE ONE TABLET BY MOUTH ONCE DAILY    glucose blood (ONETOUCH ULTRA) test strip USE UP TO 4 TIMES DAILY AS DIRECTED    KLOR-CON M20 20 MEQ tablet TAKE 1 TABLET (20 MEQ TOTAL) BY MOUTH 3 (THREE) TIMES DAILY.    Lancets MISC Use to check blood sugars up to 4 times daily as directed. E11.9    lubiprostone ([name]) 24 MCG capsule TAKE 1 CAPSULE (24 MCG TOTAL) BY MOUTH 2 (TWO) TIMES DAILY WITH A MEAL. 06/02/2020: [employer] PAP   meclizine (ANTIVERT) 12.5 MG tablet TAKE 1 TABLET (12.5 MG TOTAL) BY MOUTH 3 (THREE) TIMES DAILY AS NEEDED FOR DIZZINESS.    Multiple Vitamin (MULTIVITAMIN WITH MINERALS) TABS tablet Take 1 tablet by mouth daily after lunch.    nitroGLYCERIN (NITROSTAT) 0.4 MG SL tablet Place 1 tablet (0.4 mg total) under the tongue every 5 (five) minutes  as needed for chest pain. 01/05/2020: 2 tablets on 7/2 and 2 tablets on 7/8   omeprazole (PRILOSEC) 20 MG capsule Take 1 capsule (20 mg total) by mouth every morning.    OVER THE COUNTER MEDICATION Place 1 drop into both eyes 2 (two) times daily as needed (dry eyes). Over the counter eye drop for itching    OXcarbazepine (TRILEPTAL) 150 MG tablet Take 1 tablet (150 mg total) by mouth 2 (two) times daily.    OZEMPIC, 0.25 OR 0.5 MG/DOSE, 2 MG/1.5ML SOPN Inject 0.375 mLs (0.5 mg total) into the skin once a week. 06/02/2020: [facility] PAP   Polyvinyl Alcohol-Povidone (REFRESH OP) Place 1 drop into both eyes daily as needed (dry eyes).    pregabalin (LYRICA) 75 MG capsule TAKE ONE CAPSULE BY MOUTH EVERY MORNING and TAKE ONE CAPSULE BY MOUTH AT NOON and TAKE ONE CAPSULE BY MOUTH EVERY EVENING    Probiotic Product (PROBIOTIC PO) Take 1 capsule by mouth daily after breakfast.    rosuvastatin (CRESTOR) 5 MG tablet TAKE ONE TABLET BY MOUTH EVERYDAY AT BEDTIME    sennosides-docusate sodium (SENOKOT-S) 8.6-50 MG tablet Take 1-2 tablets by mouth daily as needed for constipation.    [DISCONTINUED] Calcium Carbonate (CALCIUM 500 PO) Take 1 capsule by mouth every other day.     No facility-administered encounter medications on file  as of 11/02/2020.    Reviewed chart prior to disease state call. Spoke with patient regarding BP  Recent Office Vitals: BP Readings from Last 3 Encounters:  08/31/20 116/76  04/06/20 120/72  02/20/20 110/60   Pulse Readings from Last 3 Encounters:  08/31/20 80  04/06/20 75  02/20/20 (!) 56    Wt Readings from Last 3 Encounters:  08/31/20 163 lb (73.9 kg)  04/06/20 163 lb (73.9 kg)  02/20/20 159 lb 12.8 oz (72.5 kg)     Kidney Function Lab Results  Component Value Date/Time   CREATININE 0.76 08/31/2020 12:26 PM   CREATININE 0.76 01/23/2020 11:11 AM   CREATININE 0.77 01/06/2020 03:19 AM   GFR 75.44 08/31/2020 12:26 PM   GFRNONAA 76 01/23/2020 11:11 AM   GFRAA 88  01/23/2020 11:11 AM    BMP Latest Ref Rng & Units 08/31/2020 01/23/2020 01/06/2020  Glucose 70 - 99 mg/dL 118(H) 84 126(H)  BUN 6 - 23 mg/dL 14 13 14   Creatinine 0.40 - 1.20 mg/dL 0.76 0.76 0.77  BUN/Creat Ratio 6 - 22 (calc) - NOT APPLICABLE -  Sodium 564 - 145 mEq/L 139 140 137  Potassium 3.5 - 5.1 mEq/L 3.9 3.6 3.1(L)  Chloride 96 - 112 mEq/L 100 100 99  CO2 19 - 32 mEq/L 31 29 28   Calcium 8.4 - 10.5 mg/dL 10.4 10.3 9.7    Reviewed chart for medication changes ahead of medication coordination call.  No OVs, Consults, or hospital visits since last care coordination call / Pharmacist visit. No medication changes indicated  BP Readings from Last 3 Encounters:  08/31/20 116/76  04/06/20 120/72  02/20/20 110/60    Lab Results  Component Value Date   HGBA1C 7.4 (H) 08/31/2020     Patient obtains medications through Vials  30 Days   Last adherence delivery included (date: 09/21/20): Ezetimibe 10 mg 1 tab daily Pregabalin 75 mg 1 cap three times daily Omeprazole 20 mg 1 cap every morning  Rosuvastatin 5 mg 1 tab daily at bedtime   Patient is due for next adherence delivery on: 11/18/20 Ezetimibe 10 mg 1 tab daily Pregabalin 75 mg 1 cap three times daily Omeprazole 20 mg 1 cap every morning  Rosuvastatin 5 mg 1 tab daily at bedtime Oxcarbazepine 150 mg 1 tab 150 mg 2x daily   Called patient and reviewed medication needs.   Patient need refills for: Ezetimbe-CPP to requests   Star Rating Drugs: Rouvastatin - last fill 10/01/20    Orinda Kenner, [place] Clinical Pharmacists Assistant [phone]

## 2020-11-06 ENCOUNTER — Other Ambulatory Visit: Payer: Self-pay

## 2020-11-06 ENCOUNTER — Encounter: Payer: Self-pay | Admitting: Psychology

## 2020-11-06 ENCOUNTER — Ambulatory Visit: Payer: HMO | Admitting: Psychology

## 2020-11-06 ENCOUNTER — Ambulatory Visit (INDEPENDENT_AMBULATORY_CARE_PROVIDER_SITE_OTHER): Payer: HMO | Admitting: Psychology

## 2020-11-06 DIAGNOSIS — I679 Cerebrovascular disease, unspecified: Secondary | ICD-10-CM

## 2020-11-06 DIAGNOSIS — G3184 Mild cognitive impairment, so stated: Secondary | ICD-10-CM

## 2020-11-06 DIAGNOSIS — G40909 Epilepsy, unspecified, not intractable, without status epilepticus: Secondary | ICD-10-CM | POA: Diagnosis not present

## 2020-11-06 DIAGNOSIS — G4733 Obstructive sleep apnea (adult) (pediatric): Secondary | ICD-10-CM | POA: Diagnosis not present

## 2020-11-06 DIAGNOSIS — F0670 Mild neurocognitive disorder due to known physiological condition without behavioral disturbance: Secondary | ICD-10-CM | POA: Insufficient documentation

## 2020-11-06 DIAGNOSIS — I10 Essential (primary) hypertension: Secondary | ICD-10-CM | POA: Insufficient documentation

## 2020-11-06 DIAGNOSIS — R4189 Other symptoms and signs involving cognitive functions and awareness: Secondary | ICD-10-CM

## 2020-11-06 HISTORY — DX: Mild neurocognitive disorder due to known physiological condition without behavioral disturbance: F06.70

## 2020-11-06 NOTE — Progress Notes (Signed)
NEUROPSYCHOLOGICAL EVALUATION [place]. [facility] Department of Neurology  Date of Evaluation: Nov 06, 2020  Reason for Referral:   [name] is a 80 y.o. right-handed African-American female referred by Billey Gosling, M.D., to characterize her current cognitive functioning and assist with diagnostic clarity and treatment planning in the context of subjective cognitive decline.   Assessment and Plan:   Clinical Impression(s): [name]'s pattern of performance is suggestive of primary impairments surrounding processing speed and visuospatial abilities. To a lesser extent, impairments were additionally seen across both executive functioning and phonemic fluency. Performance variability was exhibited across semantic fluency, confrontation naming, and both retrieval and consolidation aspects of verbal and visual memory. Performance was appropriate across basic attention, receptive language, safety/judgment, and encoding (i.e., learning) aspects of verbal memory. [name] denied difficulties completing instrumental activities of daily living (ADLs) independently and her daughter was in agreement. As such, given evidence for cognitive dysfunction described above, she meets criteria for a Mild Neurocognitive Disorder ("mild cognitive impairment") at the present time.  The etiology of ongoing cognitive dysfunction is unclear at the present time and likely multifactorial in nature. [name] does have a history of numerous cardiovascular ailments, as well as type II diabetes, and a primary vascular etiology remains a possibility. Deficits across processing speed, more complex attention, executive functioning, and retrieval aspects of memory would fall in line with expectations. However, [name] does not have a history of a cerebrovascular infarct and neuroimaging did not suggest noteworthy small vessel ischemic changes, which would certainly be expected given the extent of  certain impairments. Vascular-related deficits could certainly be exacerbated by her seizure history, as well as report of chronic, debilitating neuropathy and untreated obstructive sleep apnea. Specific to the former, research has suggested that nocturnal seizures have a propensity of being frontally located which could certainly impact executive functions. Additionally, ongoing uncorrected hearing loss may also be artificially lowering some scores given ongoing sensory deficits. It remains possible that current deficits are related to a combination of these factors described above.   More specific to concerns surrounding potential neurodegenerative processes, Lewy body dementia does seem to warrant additional monitoring over time. Prominent impairments in visuospatial abilities, coupled with additional weakness/impairment in processing speed, attention/concentration, and executive functioning is the expected pattern with this condition. It is reassuring to me that [name] does not demonstrate any behavioral characteristics which commonly accompany this condition (e.g., fully-formed visual hallucinations, REM sleep behaviors, or parkinsonian features). Gait abnormalities could certainly be better accounted for by ongoing neuropathy. However, the absence of these behavioral characteristics does not eliminate the chance of this illness and it may be that she is in very early stages at the present time. Variability across retrieval aspects of memory and confrontation naming could raise concerns for Alzheimer's disease. However, her pattern of deficits does not suggest rapid forgetting or a consistent memory storage deficit at the present time, making this diagnosis less compelling. I do not see evidence for frontotemporal dementia and there was no report of behavioral characteristics concerning for more rare parkinsonian conditions such as corticobasal degeneration or progressive supranuclear palsy. Continued  medical monitoring will be important moving forward.  Recommendations: A repeat neuropsychological evaluation in 24 months (or sooner if functional decline is noted or Lewy body symptoms emerge) is recommended to assess the trajectory of future cognitive decline should it occur. This will also aid in future efforts towards improved diagnostic clarity.  A repeat brain MRI would be beneficial  as her most recent scan was completed in 2016. This would allow for a more updated understanding of cerebrovascular contributions to her current presentation.   [name] should be referred for an audiologic evaluation and likely be fitted with hearing aids given ongoing hearing loss. There have been a few studies which have linked uncorrected hearing loss with the development of dementia. As such, this is important to actively treat. I would also encourage her to re-establish care with her neurologist (Dr. Posey Pronto) given that testing did reveal objective cognitive deficits which need to be monitored over time.   I strongly encourage [name] to utilize her CPAP machine nightly given her history of obstructive sleep apnea. This condition can certainly exacerbate cognitive dysfunction. If left untreated, it will also increase her risk for heart attack, stroke, and dementia.   Should there be a progression of her current deficits over time, [name] is unlikely to regain any independent living skills lost. Therefore, it is recommended that she remain as involved as possible in all aspects of household chores, finances, and medication management, with supervision to ensure adequate performance if necessary. She will likely benefit from the establishment and maintenance of a routine in order to maximize her functional abilities over time.  It will be beneficial for [name] to have another person with her when in situations where she may need to process information, weigh the pros and cons of different options, and make  decisions, in order to ensure that she fully understands and recalls all information to be considered.  [name] and her daughter stated that she does not drive and has not done so for an extended period of time. While performance across neurocognitive testing is admittedly not a strong predictor of an individual's safety operating a motor vehicle, I do feel that continued abstinence from driving is important given deficits in processing speed, executive functioning, and visuospatial abilities.  [name] is encouraged to attend to lifestyle factors for brain health (e.g., regular physical exercise, good nutrition habits, regular participation in cognitively-stimulating activities, and general stress management techniques), which are likely to have benefits for both emotional adjustment and cognition. Optimal control of vascular risk factors (including safe cardiovascular exercise and adherence to dietary recommendations) is encouraged. Continued participation in activities which provide mental stimulation and social interaction is also recommended.   When learning new information, she would benefit from information being broken up into small, manageable pieces. She may also find it helpful to articulate the material in her own words and in a context to promote encoding at the onset of a new task. This material may need to be repeated multiple times to promote encoding.  Memory can be improved using external strategies such as written notes in a consistently used memory journal, visual and nonverbal auditory cues such as a calendar on the refrigerator, or appointments with alarm, such as on a cell phone.  To address problems with processing speed, she may wish to consider:   -Ensuring that she is alerted when essential material or instructions are being presented   -Adjusting the speed at which new information is presented   -Allowing for more time in comprehending, processing, and responding in  conversation  To address problems with fluctuating attention, she may wish to consider:   -Avoiding external distractions when needing to concentrate   -Limiting exposure to fast paced environments with multiple sensory demands   -Writing down complicated information and using checklists   -Attempting and completing one task  at a time (i.e., no multi-tasking)   -Verbalizing aloud each step of a task to maintain focus   -Reducing the amount of information considered at one time  Review of Records:   [name] was seen by [facility] Narda Amber, D.O.) most recently on 07/09/2018 for follow-up of various medical conditions. She has been diabetic since early 2000 and was initially taking insulin and then transitioned to oral medications. In 2008, she began having numbness of the toes which worsened since 2012. She also reported tingling and numbness of the feet, ankles, and lower legs.On rare occasions she will have a burning sensation of the face. At that time, she was taking gabapentin 600mg  three times daily which seemed to help; however, this did not completely alleviate the pain.This was gradually titrated to 800 mg 3 times daily. Unfortunately, she could not tolerate this due to complaints of hives. In 2013, she underwent EMG of the upper which showed bilateral CTS.   In 2009 she started having rare spells of nocturnal generalized seizures with her last seizure occurring in 2014. She does not recall any details due to her being asleep. Her husband would reportedly tell her that she was shaking and biting her tongue.No history of urinary or bowel incontinence was reported. Seizures were said to last around five minutes. When she awoke, she would appear tired and confused.She had been taking oxcarbazepine 225mg  twice daily and denied any side effects. During her visit in October 2018, she was offered nortriptyline but did not tolerate this. At that time, she was taking gabapentin 800mg  three  times daily and extra 100mg  as needed to help ease her pain. She was noted to have some imbalance and will use a cane as needed. No recent falls were reported.  [name] was seen by her PCP Billey Gosling, M.D.) on 08/31/2020 for follow-up of type II diabetes, diabetic neuropathy, CAD, hyperlipidemia, constipation, generalized anxiety, and GERD. She reported falling about a year ago and injured her shoulders. She also noted that her head does not feel right in that her right frontal region seems to hurt when she holds her head down, with pain then running down her face and all the way down her body. Ongoing numbness and neuropathy was said to be "terrible" and progressively worsened. She reported feeling tingling and burning sensations all over. She also reported that her thinking does not seem to be as clear and that she cannot always think about what she needs to do. In line with this, she did express specific concerns about dementia. Ultimately, [name] was referred for a comprehensive neuropsychological evaluation to characterize her cognitive abilities and to assist with diagnostic clarity and treatment planning.   Head CT on 10/23/2001 in the context of a seizure was negative. Head CT on 11/22/2002 in the context of a seizure was negative. Brain MRI on 11/23/2002 revealed mild supratentorial atrophy without evidence for a focal or acute lesion. Head CT on 05/09/2005 in the context of a seizure was negative. Head CT on 10/24/2006 in the context of a suspected TIA was negative. Brain MRI on 10/24/2006 revealed atrophy and small vessel disease, minimally progressive since 2004. Brain MRA on 10/24/2006 did not reveal any flow-limiting stenosis or vascular abnormality. Brain MRI on 09/27/2014 in the context of worsening dizziness and nausea revealed a few scattered hyperintensities consistent with small vessel ischemic disease. Head CT on 09/13/2020 in the context of a fall in the year prior revealed mild generalized  cerebral atrophy, stable  relative to her prior 2016 MRI.   Past Medical History:  Diagnosis Date  . Abdominal distension, gaseous 04/10/2017  . Allergic rhinitis 03/13/2013  . CAD (coronary artery disease) 05/28/2018  . Cerebrovascular disease   . Chest pain 10/12/2016  . Degeneration of lumbar intervertebral disc 08/02/2017  . Diabetic neuropathy 05/12/2014   Did not tolerated cymbalta - hives, itching Tried nortriptyline - not effective Did not tolerate Effexor-nausea gabapentin-not effective   . Diverticulitis of large intestine 05/12/2014  . Dyslipidemia   . Gastroesophageal reflux disease 03/18/2009  . Hearing loss 02/01/2017  . Hypercalcemia 10/12/2016  . Hypertension   . Hypokalemia 01/06/2020  . Insomnia 03/20/2009  . Irritable bowel syndrome 03/20/2009   With chronic constipation  . Laryngopharyngeal reflux (LPR) 09/27/2016  . Lumbar pain 08/23/2016  . Myofascial pain 05/19/2015  . Neck pain 09/27/2016  . Non-toxic multinodular goiter 05/18/2009  . Obstructive sleep apnea 03/18/2009   Not using CPAP  . Osteoarthritis of left knee 01/12/2017  . Osteoarthritis of shoulder region   . Pain in joint of right shoulder 01/21/2019  . Presbycusis of both ears 09/27/2016  . Seizure disorder   . Situational anxiety   . Spondylolisthesis 09/20/2017  . Steatosis of liver 05/12/2014  . Type 2 diabetes mellitus 05/12/2014  . Unstable angina 01/05/2020  . Vertigo 04/13/2009    Past Surgical History:  Procedure Laterality Date  . ABDOMINAL HYSTERECTOMY  1970's   Partial  . APPENDECTOMY    . CHOLECYSTECTOMY    . LEFT HEART CATH AND CORONARY ANGIOGRAPHY N/A 05/28/2018   Procedure: LEFT HEART CATH AND CORONARY ANGIOGRAPHY;  Surgeon: Belva Crome, MD;  Location: [facility];  Service: Cardiovascular;  Laterality: N/A;  . LEFT HEART CATH AND CORONARY ANGIOGRAPHY N/A 01/06/2020   Procedure: LEFT HEART CATH AND CORONARY ANGIOGRAPHY;  Surgeon: Belva Crome, MD;  Location: [facility];   Service: Cardiovascular;  Laterality: N/A;  . LUMBAR EPIDURAL INJECTION Left 08/25/2017  . SHOULDER SURGERY  2008   LT, post fall   . TONSILLECTOMY AND ADENOIDECTOMY    . TOTAL KNEE ARTHROPLASTY Left 01/12/2017   Procedure: LEFT TOTAL KNEE ARTHROPLASTY;  Surgeon: Susa Day, MD;  Location: [facility];  Service: Orthopedics;  Laterality: Left;  120 mins    Current Outpatient Medications:  .  acetaminophen (TYLENOL) 500 MG tablet, Take 500 mg by mouth every 6 (six) hours as needed for headache (pain)., Disp: , Rfl:  .  albuterol (VENTOLIN HFA) 108 (90 Base) MCG/ACT inhaler, Inhale 2 puffs into the lungs every 6 (six) hours as needed for wheezing or shortness of breath., Disp: , Rfl:  .  Alpha-D-Galactosidase (BEANO PO), Take 1-2 tablets by mouth daily as needed (for gas)., Disp: , Rfl:  .  aspirin EC 81 MG tablet, Take 1 tablet (81 mg total) by mouth daily., Disp: 90 tablet, Rfl: 3 .  azelastine (ASTELIN) 0.1 % nasal spray, Place 2 sprays into both nostrils 2 (two) times daily as needed for rhinitis. Use in each nostril as directed, Disp: , Rfl:  .  clonazePAM (KLONOPIN) 0.5 MG tablet, TAKE 1/2 TO 1 TABLET TWICE A DAY AS NEEDED FOR ANXIETY (Patient taking differently: Take 0.5 mg by mouth 2 (two) times daily as needed for anxiety.), Disp: 60 tablet, Rfl: 0 .  empagliflozin (JARDIANCE) 25 MG TABS tablet, Take 1 tablet (25 mg total) by mouth daily before breakfast., Disp: 90 tablet, Rfl: 1 .  ezetimibe (ZETIA) 10 MG tablet, TAKE ONE  TABLET BY MOUTH ONCE DAILY, Disp: 90 tablet, Rfl: 1 .  glucose blood (ONETOUCH ULTRA) test strip, USE UP TO 4 TIMES DAILY AS DIRECTED, Disp: 400 strip, Rfl: 3 .  KLOR-CON M20 20 MEQ tablet, TAKE 1 TABLET (20 MEQ TOTAL) BY MOUTH 3 (THREE) TIMES DAILY., Disp: 270 tablet, Rfl: 1 .  Lancets MISC, Use to check blood sugars up to 4 times daily as directed. E11.9, Disp: 400 each, Rfl: 3 .  lubiprostone ([name]) 24 MCG capsule, TAKE 1 CAPSULE (24 MCG TOTAL) BY MOUTH 2 (TWO) TIMES  DAILY WITH A MEAL., Disp: 180 capsule, Rfl: 1 .  meclizine (ANTIVERT) 12.5 MG tablet, TAKE 1 TABLET (12.5 MG TOTAL) BY MOUTH 3 (THREE) TIMES DAILY AS NEEDED FOR DIZZINESS., Disp: 30 tablet, Rfl: 0 .  Multiple Vitamin (MULTIVITAMIN WITH MINERALS) TABS tablet, Take 1 tablet by mouth daily after lunch., Disp: , Rfl:  .  nitroGLYCERIN (NITROSTAT) 0.4 MG SL tablet, Place 1 tablet (0.4 mg total) under the tongue every 5 (five) minutes as needed for chest pain., Disp: 25 tablet, Rfl: 2 .  omeprazole (PRILOSEC) 20 MG capsule, Take 1 capsule (20 mg total) by mouth every morning., Disp: 90 capsule, Rfl: 1 .  OVER THE COUNTER MEDICATION, Place 1 drop into both eyes 2 (two) times daily as needed (dry eyes). Over the counter eye drop for itching, Disp: , Rfl:  .  OXcarbazepine (TRILEPTAL) 150 MG tablet, Take 1 tablet (150 mg total) by mouth 2 (two) times daily., Disp: 180 tablet, Rfl: 0 .  OZEMPIC, 0.25 OR 0.5 MG/DOSE, 2 MG/1.5ML SOPN, Inject 0.375 mLs (0.5 mg total) into the skin once a week., Disp: 1.5 mL, Rfl: 2 .  Polyvinyl Alcohol-Povidone (REFRESH OP), Place 1 drop into both eyes daily as needed (dry eyes)., Disp: , Rfl:  .  pregabalin (LYRICA) 75 MG capsule, TAKE ONE CAPSULE BY MOUTH EVERY MORNING and TAKE ONE CAPSULE BY MOUTH AT NOON and TAKE ONE CAPSULE BY MOUTH EVERY EVENING, Disp: 90 capsule, Rfl: 4 .  Probiotic Product (PROBIOTIC PO), Take 1 capsule by mouth daily after breakfast., Disp: , Rfl:  .  rosuvastatin (CRESTOR) 5 MG tablet, TAKE ONE TABLET BY MOUTH EVERYDAY AT BEDTIME, Disp: 90 tablet, Rfl: 4 .  sennosides-docusate sodium (SENOKOT-S) 8.6-50 MG tablet, Take 1-2 tablets by mouth daily as needed for constipation., Disp: , Rfl:   Clinical Interview:   The following information was obtained during a clinical interview with [name] and her daughter prior to cognitive testing.  Cognitive Symptoms: Decreased short-term memory: Endorsed. [name] appeared to minimize memory concerns,  acknowledging that she will misplace or lose things in her residence, but that memory lapses are "not big things." Her daughter noted that there are some concerns surrounding [name] being unable to recall names or details of past conversations in the moment. [name]. Juhasz clarified that these things will generally come to her with time. Memory dysfunction was said to be present for the past year or so and has gradually worsened over time.  Decreased long-term memory: Denied. Decreased attention/concentration: Denied. Reduced processing speed: Endorsed "a little bit." Difficulties with executive functions: Denied. Trouble with impulsivity or overt personality changes was also denied.  Difficulties with emotion regulation: Denied. Difficulties with receptive language: Denied assuming she can hear the source of the sound adequately.  Difficulties with word finding: Endorsed "occasionally."  Decreased visuoperceptual ability: Denied.  Difficulties completing ADLs: Denied. [name]. Rotar currently lives with family but denied trouble managing medications or finances  independently. She does not drive, but her abstaining from driving was not in any way due to cognitive concerns. Her daughter was in agreement with this assessment.    Additional Medical History: History of traumatic brain injury/concussion: Denied. However, she did acknowledge a fall that occurred within the past year which resulted in shoulder pain. During the current interview, she denied hitting her head or ever being diagnosed with a concussion stemming from this event. No other potential head injuries were reported.  History of stroke: Denied. History of seizure activity: Endorsed. As stated above, nocturnal seizure activity was said to be present since around 2009. The cause for seizure activity was unknown. [name]. Cart noted that she was asleep during these events and that she would be unaware of them occurring until woken up by her husband. She  would then note some confusion and the presence of a tongue bite. Her and her daughter noted that seizure activity has not been present since around 2015 and is currently managed well with medications.  History of known exposure to toxins: Denied. Symptoms of chronic pain: Endorsed. Consistent with medical records, [name]. Chiao reported significant and often debilitating neuropathic pain. Symptoms were said to especially impact her lower extremities and impact balance. She stated that she "hurts all day, every day."  Experience of frequent headaches/migraines: Denied. However, she did report occasional headache symptoms. This frequency was said to have diminished naturally over time. Exacerbations may be related to seasonal allergies.  Frequent instances of dizziness/vertigo: Symptoms of vertigo/dizziness were said to occur occasionally. [name]. Rourke reported having medications which she takes as needed which have thus far been effective. The cause of these spells was unknown.   Sensory changes: She reported utilizing reading glasses with positive effect and is awaiting a new prescription. She also acknowledged ongoing hearing loss, stating that she doesn't "have hearing aids but need them." She reported over the past month or so observing a diminished sense of taste for an unknown reason. No changes surrounding her sense of smell was reported.  Balance/coordination difficulties: Endorsed. She described her balance as being "off." This was generally attributed to symptoms of neuropathy and may also be influenced by vertigo/dizzy spells. She will occasionally utilize a cane for added stabilization while ambulating. Outside of the fall described above, she denied any recent falls or a history of frequent falling behaviors.   Other motor difficulties: Denied.  Sleep History: Estimated hours obtained each night: 7-9 hours.  Difficulties falling asleep: Endorsed. Difficulties staying asleep: Endorsed. Trouble with  falling and staying asleep was generally attributed to pain symptoms and overall physical discomfort.  Feels rested and refreshed upon awakening: Endorsed "most mornings."   History of snoring: Endorsed. History of waking up gasping for air: Endorsed. Witnessed breath cessation while asleep: Endorsed. [name]. Ten[place]aca did not report a history of obstructive sleep apnea initially. However, he daughter stated that she had been diagnosed with this condition in the past, which is consistent with medical records. When brought up, [name]. Giudice then acknowledged this history, stating that she had not used her CPAP machine for many years.   History of vivid dreaming: Denied. Excessive movement while asleep: Denied. Instances of acting out her dreams: Denied.  Psychiatric/Behavioral Health History: Depression: Acutely, she described her mood as "pretty good" overall. However, she and her daughter did acknowledge that an extended family member recently passed away and that there has been some grief and sadness surrounding this event. To her knowledge, [name]. Bearman denied any previous concerns  surrounding major depressive disorder. Current or remote suicidal ideation, intent, or plan was denied.  Anxiety: Endorsed. She acknowledged a longstanding history of mild, generalized anxiety symptoms. More acutely, symptoms were said to be more situational and present "here and there." She does utilize anxiety-based medications as needed with positive effect.  Mania: Denied. Trauma History: Denied. Visual/auditory hallucinations: Denied. Delusional thoughts: Denied.  Tobacco: Denied. Alcohol: She denied current alcohol consumption as well as a history of problematic alcohol abuse or dependence.  Recreational drugs: Denied. Caffeine: She reported consuming one cup of coffee in the morning.   Family History: Problem Relation Age of Onset  . Arthritis Mother   . Heart disease Father   . Arthritis Other        Grandmother  .  Diabetes Other        Grandmother  . Colon cancer Neg Hx   . Stomach cancer Neg Hx   . Pancreatic cancer Neg Hx    This information was confirmed by [name]. Lacinda Axon.  Academic/Vocational History: Highest level of educational attainment: 16 years. 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 She described herself as an average (B/C, few A) student in academic settings. Math was noted as a likely relative weakness.  History of developmental delay: Denied. History of grade repetition: Denied. Enrollment in special education courses: Denied. History of LD/ADHD: Denied.  Employment: Retired. 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   Evaluation Results:   Behavioral Observations: [name]. Fenster was accompanied by her daughter, arrived to her appointment on time, and was appropriately dressed and groomed. She appeared alert and oriented. Observed gait and station were somewhat slowed but overall within normal limits. She did not utilize a cane during the current appointment. Gross motor functioning appeared intact upon informal observation and no abnormal movements (e.g., tremors) were noted. Her affect was generally relaxed and positive. Hearing loss was apparent during the interview, causing [name]. Shams to move her chair closer to myself to better hear my questions. Spontaneous speech was fluent and word finding difficulties were generally not observed during interview. Thought processes were coherent, organized, and normal in content. Insight into her cognitive difficulties appeared somewhat limited in that she appeared to minimize ongoing dysfunction despite objective testing revealing greater levels of concern.   During testing, hearing loss was again noted. An amplification device (i.e., pocket  talker) became available at the mid-way point and was utilized with success from that point onward. Prior to this, it was not believed that hearing loss significantly impacted testing procedures as she progressed through tasks without noted issue. Some more complex tasks did require instruction clarification due to mild confusion (TMT B, D-KEFS Color-Word). Sustained attention was appropriate. Processing appeared diminished; this was especially notable while assessing receptive language. Task engagement was adequate and she persisted when challenged. Overall, [name]. Mcgrory was cooperative with the clinical interview and subsequent testing procedures.   Adequacy of Effort: The validity of neuropsychological testing is limited by the extent to which the individual being tested may be assumed to have exerted adequate effort during testing. [name]. Defalco expressed her intention to perform to the best of her abilities and exhibited adequate task engagement and persistence. Scores across stand-alone and embedded performance validity measures were within expectation. As such, the results of the current evaluation are believed to be a valid  representation of [name]. Muriel's current cognitive functioning.  Test Results: [name]. Shaughnessy was fully oriented at the time of the current evaluation.  Intellectual abilities based upon educational and vocational attainment were estimated to be in the average range. Premorbid abilities were estimated to be within the below average range based upon a single-word reading test.   Processing speed was generally exceptionally low. Basic attention was average. More complex attention (e.g., working memory) was well below average to below average. Executive functioning was variable, ranging from the exceptionally low to below average normative ranges. She did perform in the average range across a task assessing safety and judgment.  Assessed receptive language abilities were below average. Assessed  expressive language was variable. Phonemic fluency was well below average, semantic fluency was well below average to average, and confrontation naming was average on a screening instrument but exceptionally low across a more comprehensive assessment.    Assessed visuospatial/visuoconstructional abilities were exceptionally low to well below average. Points were lost on her drawing of a clock due to poor numerical spatial arrangement, the numbers 1 and 2 being placed twice, and incorrect hand placement. Points were lost on her copy of a complex figure due to distortions and incorrect spatial properties of several internal and external aspects.    Learning (i.e., encoding) of novel verbal information was below average. Spontaneous delayed recall (i.e., retrieval) of previously learned information was exceptionally low to below average. Retention rates were 71% across a story learning task, 14% across a list learning task, and 27% across a figure drawing task. Performance across recognition tasks was also exceptionally low to below average, suggesting some evidence for information consolidation.   Results of emotional screening instruments suggested that recent symptoms of generalized anxiety were in the mild range, while symptoms of depression were within normal limits. A screening instrument assessing recent sleep quality suggested the presence of minimal sleep dysfunction.  Tables of Scores:   Note: This summary of test scores accompanies the interpretive report and should not be considered in isolation without reference to the appropriate sections in the text. Descriptors are based on appropriate normative data and may be adjusted based on clinical judgment. The terms "impaired" and "within normal limits (WNL)" are used when a more specific level of functioning cannot be determined.       Validity Testing:   DESCRIPTOR       Dot Counting Test: --- --- Within Expectation  RBANS Effort Index: --- ---  Within Expectation  WAIS-IV Reliable Digit Span: --- --- Within Expectation       Orientation:      Raw Score Percentile   NAB Orientation, Form 1 29/29 --- ---       Cognitive Screening:           Raw Score Percentile   SLUMS: 15/30 --- ---       RBANS, Form A: Standard Score/ Scaled Score Percentile   Total Score 64 1 Exceptionally Low  Immediate Memory 81 10 Below Average    List Learning 7 16 Below Average    Story Memory 6 9 Below Average  Visuospatial/Constructional 62 1 Exceptionally Low    Figure Copy 5 5 Well Below Average    Line Orientation 6/20 <2 Exceptionally Low  Language 85 16 Below Average    Picture Naming 9/10 26-50 Average    Semantic Fluency 4 2 Well Below Average  Attention 75 5 Well Below Average    Digit Span 11 63 Average    Coding  1 <1 Exceptionally Low  Delayed Memory 56 <1 Exceptionally Low    List Recall 1/10 3-9 Well Below Average    List Recognition 15/20 <2 Exceptionally Low    Story Recall 6 9 Below Average    Story Recognition 9/12 16-26 Below Average    Figure Recall 4 2 Well Below Average    Figure Recognition 4/8 9-20 Below Average       Intellectual Functioning:           Standard Score Percentile   Test of Premorbid Functioning: 86 18 Below Average       Attention/Executive Function:          Trail Making Test (TMT): Raw Score  (T Score) Percentile     Part A 96 secs.,  1 error (24) <1 Exceptionally Low    Part B Discontinued --- Impaired         Scaled Score Percentile   WAIS-IV Digit Span: 6 9 Below Average    Forward 10 50 Average    Backward 6 9 Below Average    Sequencing 4 2 Well Below Average        Scaled Score Percentile   WAIS-IV Similarities: 5 5 Well Below Average       D-KEFS Color-Word Interference Test: Raw Score (Scaled Score) Percentile     Color Naming 57 secs. (1) <1 Exceptionally Low    Word Reading 34 secs. (6) 9 Below Average    Inhibition 92 secs. (7) 16 Below Average      Total Errors 4 errors  (9) 37 Average    Inhibition/Switching 123 secs. (4) 2 Well Below Average      Total Errors 18 errors (1) <1 Exceptionally Low       NAB Executive Functions Module, Form 1: T Score Percentile     Judgment 53 62 Average       Language:          Verbal Fluency Test: Raw Score  (T Score) Percentile     Phonemic Fluency (FAS) 16 (32) 4 Well Below Average    Animal Fluency 13 (45) 31 Average        NAB Language Module, Form 1: T Score Percentile     Auditory Comprehension 37 9 Below Average    Naming 22/31 (20) <1 Exceptionally Low       Visuospatial/Visuoconstruction:      Raw Score Percentile   Clock Drawing: 6/10 --- Impaired       Mood and Personality:      Raw Score Percentile   Geriatric Depression Scale: 4 --- Within Normal Limits  Geriatric Anxiety Scale: 12 --- Mild    Somatic 7 --- Mild    Cognitive 3 --- Mild    Affective 2 --- Minimal       Additional Questionnaires:      Raw Score Percentile   PROMIS Sleep Disturbance Questionnaire: 20 --- None to Slight   Informed Consent and Coding/Compliance:   The current evaluation represents a clinical evaluation for the purposes previously outlined by the referral source and is in no way reflective of a forensic evaluation.   [name]. Smiling was provided with a verbal description of the nature and purpose of the present neuropsychological evaluation. Also reviewed were the foreseeable risks and/or discomforts and benefits of the procedure, limits of confidentiality, and mandatory reporting requirements of this provider. The patient was given the opportunity to ask questions and receive answers about the evaluation. Oral consent to participate  was provided by the patient.   This evaluation was conducted by Christia Reading, Ph.D., licensed clinical neuropsychologist. [name]. Becherer completed a clinical interview with Dr. Melvyn Novas, billed as one unit [phone], and 160 minutes of cognitive testing and scoring, billed as one unit [phone] and four  additional units 96139. Psychometrist Milana Kidney, B.S., assisted Dr. Melvyn Novas with test administration and scoring procedures. As a separate and discrete service, Dr. Melvyn Novas spent a total of 165 minutes in interpretation and report writing billed as one unit [phone] and two units 96133.

## 2020-11-06 NOTE — Progress Notes (Signed)
   Psychometrician Note   Cognitive testing was administered to [name] by [name], B.S. (psychometrist) under the supervision of Dr. Christia Coffey, Ph.D., licensed psychologist on 11/06/20. [name] did not appear overtly distressed by the testing session per behavioral observation or responses across self-report questionnaires. Rest breaks were offered.    The battery of tests administered was selected by Dr. Christia Coffey, Ph.D. with consideration to [name]'s current level of functioning, the nature of her symptoms, emotional and behavioral responses during interview, level of literacy, observed level of motivation/effort, and the nature of the referral question. This battery was communicated to the psychometrist. Communication between Dr. Christia Coffey, Ph.D. and the psychometrist was ongoing throughout the evaluation and Dr. Christia Coffey, Ph.D. was immediately accessible at all times. Dr. Christia Coffey, Ph.D. provided supervision to the psychometrist on the date of this service to the extent necessary to assure the quality of all services provided.    [name] will return within approximately 1-2 weeks for an interactive feedback session with Dr. Melvyn Coffey at which time her test performances, clinical impressions, and treatment recommendations will be reviewed in detail. [name] understands she can contact our office should she require our assistance before this time.  A total of 160 minutes of billable time were spent face-to-face with Ms. Power by the psychometrist. This includes both test administration and scoring time. Billing for these services is reflected in the clinical report generated by Dr. Christia Coffey, Ph.D.  This note reflects time spent with the psychometrician and does not include test scores or any clinical interpretations made by Dr. Melvyn Coffey. The full report will follow in a separate note.

## 2020-11-08 MED ORDER — EZETIMIBE 10 MG PO TABS: 10.0000 mg | ORAL_TABLET | Freq: Every day | ORAL | 0 refills | Status: DC

## 2020-11-08 NOTE — Addendum Note (Signed)
Addended by: Charlton Haws on: 11/08/2020 06:56 PM   Modules accepted: Orders

## 2020-11-09 NOTE — Telephone Encounter (Cosign Needed)
Spoke with [facility] and they stated it was too early for refill, they will process the requests and hold until June 7th.

## 2020-11-10 DIAGNOSIS — H25812 Combined forms of age-related cataract, left eye: Secondary | ICD-10-CM | POA: Diagnosis not present

## 2020-11-10 DIAGNOSIS — H43811 Vitreous degeneration, right eye: Secondary | ICD-10-CM | POA: Diagnosis not present

## 2020-11-10 DIAGNOSIS — H524 Presbyopia: Secondary | ICD-10-CM | POA: Diagnosis not present

## 2020-11-10 DIAGNOSIS — E119 Type 2 diabetes mellitus without complications: Secondary | ICD-10-CM | POA: Diagnosis not present

## 2020-11-10 DIAGNOSIS — H52223 Regular astigmatism, bilateral: Secondary | ICD-10-CM | POA: Diagnosis not present

## 2020-11-15 ENCOUNTER — Telehealth: Payer: Self-pay | Admitting: Internal Medicine

## 2020-11-17 ENCOUNTER — Other Ambulatory Visit: Payer: Self-pay

## 2020-11-17 ENCOUNTER — Ambulatory Visit (INDEPENDENT_AMBULATORY_CARE_PROVIDER_SITE_OTHER): Payer: HMO | Admitting: Psychology

## 2020-11-17 DIAGNOSIS — I679 Cerebrovascular disease, unspecified: Secondary | ICD-10-CM

## 2020-11-17 DIAGNOSIS — G3184 Mild cognitive impairment, so stated: Secondary | ICD-10-CM | POA: Diagnosis not present

## 2020-11-17 DIAGNOSIS — E1142 Type 2 diabetes mellitus with diabetic polyneuropathy: Secondary | ICD-10-CM

## 2020-11-17 DIAGNOSIS — G40909 Epilepsy, unspecified, not intractable, without status epilepticus: Secondary | ICD-10-CM

## 2020-11-17 DIAGNOSIS — G4733 Obstructive sleep apnea (adult) (pediatric): Secondary | ICD-10-CM | POA: Diagnosis not present

## 2020-11-17 DIAGNOSIS — F0670 Mild neurocognitive disorder due to known physiological condition without behavioral disturbance: Secondary | ICD-10-CM

## 2020-11-17 NOTE — Progress Notes (Signed)
   Neuropsychology Feedback Session [name]. [facility]  Reason for Referral:   [name] a 80 y.o. right-handed African-American female referred by Billey Gosling, M.D.,to characterize hercurrent cognitive functioning and assist with diagnostic clarity and treatment planning in the context of subjective cognitive decline.   Feedback:   [name] completed a comprehensive neuropsychological evaluation on 11/06/2020. Please refer to that encounter for the full report and recommendations. Briefly, results suggested primary impairments surrounding processing speed and visuospatial abilities. To a lesser extent, impairments were additionally seen across both executive functioning and phonemic fluency. Performance variability was exhibited across semantic fluency, confrontation naming, and both retrieval and consolidation aspects of verbal and visual memory. Performance was appropriate across basic attention, receptive language, safety/judgment, and encoding (i.e., learning) aspects of verbal memory. The etiology of ongoing cognitive dysfunction is unclear at the present time and likely multifactorial in nature. [name] does have a history of numerous cardiovascular ailments, as well as type II diabetes, and a primary vascular etiology remains a possibility. Deficits across processing speed, more complex attention, executive functioning, and retrieval aspects of memory would fall in line with expectations. Vascular-related deficits could certainly be exacerbated by her seizure history, as well as report of chronic, debilitating neuropathy and untreated obstructive sleep apnea. It remains possible that current deficits are related to a combination of these factors described above. Despite this, continued monitoring would be prudent surrounding an underlying neurodegenerative condition.   [name] was unaccompanied during the current feedback session.  Content of the current session focused on the results of her neuropsychological evaluation. [name] was given the opportunity to ask questions and her questions were answered. She was encouraged to reach out should additional questions arise. A copy of her report was provided at the conclusion of the visit.      25 minutes were spent conducting the current feedback session with [name], billed as one unit [phone].

## 2020-11-19 MED ORDER — CLONAZEPAM 0.5 MG PO TABS: ORAL_TABLET | ORAL | 0 refills | Status: DC

## 2020-11-19 NOTE — Telephone Encounter (Signed)
1.Medication Requested: clonazePAM (KLONOPIN) 0.5 MG tablet    2. Pharmacy (Name, Street, [place]): [id number] - [place], [place]  3. On Med List: yes   4. Last Visit with PCP: 08-31-20  5. Next visit date with PCP: n/a    Agent: Please be advised that RX refills may take up to 3 business days. We ask that you follow-up with your pharmacy.

## 2020-11-19 NOTE — Addendum Note (Signed)
Addended by: Binnie Rail on: 11/19/2020 12:04 PM   Modules accepted: Orders

## 2020-11-20 ENCOUNTER — Other Ambulatory Visit: Payer: Self-pay | Admitting: Internal Medicine

## 2020-11-24 ENCOUNTER — Telehealth: Payer: Self-pay | Admitting: Pharmacist

## 2020-11-24 NOTE — Progress Notes (Signed)
Chronic Care Management Pharmacy Assistant   Name: [name]  MRN: [id number] DOB: [birth year]   Medications: Outpatient Encounter Medications as of 11/24/2020  Medication Sig Note  . acetaminophen (TYLENOL) 500 MG tablet Take 500 mg by mouth every 6 (six) hours as needed for headache (pain).   Marland Kitchen albuterol (VENTOLIN HFA) 108 (90 Base) MCG/ACT inhaler Inhale 2 puffs into the lungs every 6 (six) hours as needed for wheezing or shortness of breath.   . Alpha-D-Galactosidase (BEANO PO) Take 1-2 tablets by mouth daily as needed (for gas).   Marland Kitchen aspirin EC 81 MG tablet Take 1 tablet (81 mg total) by mouth daily.   Marland Kitchen azelastine (ASTELIN) 0.1 % nasal spray Place 2 sprays into both nostrils 2 (two) times daily as needed for rhinitis. Use in each nostril as directed   . clonazePAM (KLONOPIN) 0.5 MG tablet TAKE 1/2 TO 1 TABLET TWICE A DAY AS NEEDED FOR ANXIETY   . empagliflozin (JARDIANCE) 25 MG TABS tablet Take 1 tablet (25 mg total) by mouth daily before breakfast.   . ezetimibe (ZETIA) 10 MG tablet Take 1 tablet (10 mg total) by mouth daily.   Marland Kitchen glucose blood (ONETOUCH ULTRA) test strip USE UP TO 4 TIMES DAILY AS DIRECTED   . KLOR-CON M20 20 MEQ tablet TAKE 1 TABLET (20 MEQ TOTAL) BY MOUTH 3 (THREE) TIMES DAILY.   Marland Kitchen Lancets MISC Use to check blood sugars up to 4 times daily as directed. E11.9   . lubiprostone ([name]) 24 MCG capsule TAKE 1 CAPSULE (24 MCG TOTAL) BY MOUTH 2 (TWO) TIMES DAILY WITH A MEAL.   Marland Kitchen meclizine (ANTIVERT) 12.5 MG tablet TAKE 1 TABLET BY MOUTH 3 TIMES DAILY AS NEEDED FOR DIZZINESS.   . Multiple Vitamin (MULTIVITAMIN WITH MINERALS) TABS tablet Take 1 tablet by mouth daily after lunch.   . nitroGLYCERIN (NITROSTAT) 0.4 MG SL tablet Place 1 tablet (0.4 mg total) under the tongue every 5 (five) minutes as needed for chest pain. 01/05/2020: 2 tablets on 7/2 and 2 tablets on 7/8  . omeprazole (PRILOSEC) 20 MG capsule Take 1 capsule (20 mg total) by mouth every morning.    Marland Kitchen OVER THE COUNTER MEDICATION Place 1 drop into both eyes 2 (two) times daily as needed (dry eyes). Over the counter eye drop for itching   . OXcarbazepine (TRILEPTAL) 150 MG tablet Take ONE tablet by MOUTH TWO times daily.   Marland Kitchen OZEMPIC, 0.25 OR 0.5 MG/DOSE, 2 MG/1.5ML SOPN Inject 0.375 mLs (0.5 mg total) into the skin once a week. 06/02/2020: Novo Cares PAP  . Polyvinyl Alcohol-Povidone (REFRESH OP) Place 1 drop into both eyes daily as needed (dry eyes).   . pregabalin (LYRICA) 75 MG capsule TAKE ONE CAPSULE BY MOUTH EVERY MORNING and TAKE ONE CAPSULE BY MOUTH AT NOON and TAKE ONE CAPSULE BY MOUTH EVERY EVENING   . Probiotic Product (PROBIOTIC PO) Take 1 capsule by mouth daily after breakfast.   . rosuvastatin (CRESTOR) 5 MG tablet TAKE ONE TABLET BY MOUTH EVERYDAY AT BEDTIME   . sennosides-docusate sodium (SENOKOT-S) 8.6-50 MG tablet Take 1-2 tablets by mouth daily as needed for constipation.   . [DISCONTINUED] Calcium Carbonate (CALCIUM 500 PO) Take 1 capsule by mouth every other day.     No facility-administered encounter medications on file as of 11/24/2020.    Pharmacist Review  Reviewed chart for medication changes and adherence.  Recent OV, Consult or Hospital visit: Patient saw Dr. Hazle Coca on 11/06/20 and 11/16/20 No  medication changes indicated  No gaps in adherence identified. Patient has follow up scheduled with pharmacy team. No further action required.   [place] Pharmacist Assistant [phone]  Time spent:5

## 2020-12-04 ENCOUNTER — Telehealth: Payer: Self-pay | Admitting: Pharmacist

## 2020-12-07 NOTE — Progress Notes (Addendum)
Chronic Care Management Pharmacy Assistant   Name: [name]  MRN: [id number] DOB: [birth year]   Reason for Encounter: Medication Review    Recent office visits:  None ID  Recent consult visits:  11/06/20 and 11/16/20 Dr. Hazle Coca, Neurology  Hospital visits:  None in previous 6 months  Medications: Outpatient Encounter Medications as of 12/04/2020  Medication Sig Note   acetaminophen (TYLENOL) 500 MG tablet Take 500 mg by mouth every 6 (six) hours as needed for headache (pain).    albuterol (VENTOLIN HFA) 108 (90 Base) MCG/ACT inhaler Inhale 2 puffs into the lungs every 6 (six) hours as needed for wheezing or shortness of breath.    Alpha-D-Galactosidase (BEANO PO) Take 1-2 tablets by mouth daily as needed (for gas).    aspirin EC 81 MG tablet Take 1 tablet (81 mg total) by mouth daily.    azelastine (ASTELIN) 0.1 % nasal spray Place 2 sprays into both nostrils 2 (two) times daily as needed for rhinitis. Use in each nostril as directed    clonazePAM (KLONOPIN) 0.5 MG tablet TAKE 1/2 TO 1 TABLET TWICE A DAY AS NEEDED FOR ANXIETY    empagliflozin (JARDIANCE) 25 MG TABS tablet Take 1 tablet (25 mg total) by mouth daily before breakfast.    ezetimibe (ZETIA) 10 MG tablet Take 1 tablet (10 mg total) by mouth daily.    glucose blood (ONETOUCH ULTRA) test strip USE UP TO 4 TIMES DAILY AS DIRECTED    KLOR-CON M20 20 MEQ tablet TAKE 1 TABLET (20 MEQ TOTAL) BY MOUTH 3 (THREE) TIMES DAILY.    Lancets MISC Use to check blood sugars up to 4 times daily as directed. E11.9    lubiprostone ([name]) 24 MCG capsule TAKE 1 CAPSULE (24 MCG TOTAL) BY MOUTH 2 (TWO) TIMES DAILY WITH A MEAL.    meclizine (ANTIVERT) 12.5 MG tablet TAKE 1 TABLET BY MOUTH 3 TIMES DAILY AS NEEDED FOR DIZZINESS.    Multiple Vitamin (MULTIVITAMIN WITH MINERALS) TABS tablet Take 1 tablet by mouth daily after lunch.    nitroGLYCERIN (NITROSTAT) 0.4 MG SL tablet Place 1 tablet (0.4 mg total) under the tongue  every 5 (five) minutes as needed for chest pain. 01/05/2020: 2 tablets on 7/2 and 2 tablets on 7/8   omeprazole (PRILOSEC) 20 MG capsule Take 1 capsule (20 mg total) by mouth every morning.    OVER THE COUNTER MEDICATION Place 1 drop into both eyes 2 (two) times daily as needed (dry eyes). Over the counter eye drop for itching    OXcarbazepine (TRILEPTAL) 150 MG tablet Take ONE tablet by MOUTH TWO times daily.    OZEMPIC, 0.25 OR 0.5 MG/DOSE, 2 MG/1.5ML SOPN Inject 0.375 mLs (0.5 mg total) into the skin once a week. 06/02/2020: Novo Cares PAP   Polyvinyl Alcohol-Povidone (REFRESH OP) Place 1 drop into both eyes daily as needed (dry eyes).    pregabalin (LYRICA) 75 MG capsule TAKE ONE CAPSULE BY MOUTH EVERY MORNING and TAKE ONE CAPSULE BY MOUTH AT NOON and TAKE ONE CAPSULE BY MOUTH EVERY EVENING    Probiotic Product (PROBIOTIC PO) Take 1 capsule by mouth daily after breakfast.    rosuvastatin (CRESTOR) 5 MG tablet TAKE ONE TABLET BY MOUTH EVERYDAY AT BEDTIME    sennosides-docusate sodium (SENOKOT-S) 8.6-50 MG tablet Take 1-2 tablets by mouth daily as needed for constipation.    No facility-administered encounter medications on file as of 12/04/2020.    Pharmacist Review Reviewed chart for medication changes ahead of  medication coordination call.  No OVs, Consults, or hospital visits since last care coordination call/Pharmacist visit. (If appropriate, list visit date, provider name)  No medication changes indicated OR if recent visit, treatment plan here.  BP Readings from Last 3 Encounters:  08/31/20 116/76  04/06/20 120/72  02/20/20 110/60    Lab Results  Component Value Date   HGBA1C 7.4 (H) 08/31/2020     Patient obtains medications through Vials  30 Days   Last adherence delivery included:  Ezetimibe 10 mg 1 tab daily Pregabalin 75 mg 1 cap three times daily Omeprazole 20 mg 1 cap every morning Rosuvastatin 5 mg 1 tab daily at bedtime Oxcarbazepine 150 mg 1 tab 150 mg 2x daily      Patient is due for next adherence delivery on: 12/16/20. Called patient and reviewed medications and coordinated delivery.  This delivery to include:  Ezetimibe 10 mg 1 tab daily Pregabalin 75 mg 1 cap three times daily Omeprazole 20 mg 1 cap every morning Rosuvastatin 5 mg 1 tab daily at bedtime Oxcarbazepine 150 mg 1 tab 150 mg 2x daily Potassium Cl ER 20 MEQ 1 tab 3 times daily    Patient needs refills for None ID.  Confirmed delivery date of 12/16/20, advised patient that pharmacy will contact them the morning of delivery.     [facility]: [place] Pharmacist Assistant [phone]   Time spent:44

## 2020-12-14 ENCOUNTER — Other Ambulatory Visit: Payer: Self-pay | Admitting: Internal Medicine

## 2021-01-03 ENCOUNTER — Other Ambulatory Visit: Payer: Self-pay | Admitting: Internal Medicine

## 2021-01-05 ENCOUNTER — Telehealth: Payer: Self-pay | Admitting: Pharmacist

## 2021-01-05 NOTE — Progress Notes (Signed)
Chronic Care Management Pharmacy Assistant   Name: [name]  MRN: [id number] DOB: [birth year]   Reason for Encounter: Medication Review    Recent office visits:  None ID  Recent consult visits:  None ID  Hospital visits:  None in previous 6 months  Medications: Outpatient Encounter Medications as of 01/05/2021  Medication Sig Note   acetaminophen (TYLENOL) 500 MG tablet Take 500 mg by mouth every 6 (six) hours as needed for headache (pain).    albuterol (VENTOLIN HFA) 108 (90 Base) MCG/ACT inhaler Inhale 2 puffs into the lungs every 6 (six) hours as needed for wheezing or shortness of breath.    Alpha-D-Galactosidase (BEANO PO) Take 1-2 tablets by mouth daily as needed (for gas).    aspirin EC 81 MG tablet Take 1 tablet (81 mg total) by mouth daily.    azelastine (ASTELIN) 0.1 % nasal spray Place 2 sprays into both nostrils 2 (two) times daily as needed for rhinitis. Use in each nostril as directed    clonazePAM (KLONOPIN) 0.5 MG tablet TAKE 1/2 TO 1 TABLET TWICE A DAY AS NEEDED FOR ANXIETY    empagliflozin (JARDIANCE) 25 MG TABS tablet Take 1 tablet (25 mg total) by mouth daily before breakfast.    ezetimibe (ZETIA) 10 MG tablet Take 1 tablet (10 mg total) by mouth daily.    glucose blood (ONETOUCH ULTRA) test strip USE UP TO 4 TIMES DAILY AS DIRECTED    Lancets MISC Use to check blood sugars up to 4 times daily as directed. E11.9    lubiprostone ([name]) 24 MCG capsule TAKE 1 CAPSULE (24 MCG TOTAL) BY MOUTH 2 (TWO) TIMES DAILY WITH A MEAL.    meclizine (ANTIVERT) 12.5 MG tablet TAKE 1 TABLET BY MOUTH 3 TIMES DAILY AS NEEDED FOR DIZZINESS.    Multiple Vitamin (MULTIVITAMIN WITH MINERALS) TABS tablet Take 1 tablet by mouth daily after lunch.    nitroGLYCERIN (NITROSTAT) 0.4 MG SL tablet Place 1 tablet (0.4 mg total) under the tongue every 5 (five) minutes as needed for chest pain. 01/05/2020: 2 tablets on 7/2 and 2 tablets on 7/8   omeprazole (PRILOSEC) 20 MG capsule  Take 1 capsule (20 mg total) by mouth every morning.    OVER THE COUNTER MEDICATION Place 1 drop into both eyes 2 (two) times daily as needed (dry eyes). Over the counter eye drop for itching    OXcarbazepine (TRILEPTAL) 150 MG tablet Take ONE tablet by MOUTH TWO times daily.    OZEMPIC, 0.25 OR 0.5 MG/DOSE, 2 MG/1.5ML SOPN Inject 0.375 mLs (0.5 mg total) into the skin once a week. 06/02/2020: Novo Cares PAP   Polyvinyl Alcohol-Povidone (REFRESH OP) Place 1 drop into both eyes daily as needed (dry eyes).    potassium chloride SA (KLOR-CON) 20 MEQ tablet TAKE ONE TABLET BY MOUTH THREE TIMES DAILY    pregabalin (LYRICA) 75 MG capsule TAKE ONE CAPSULE BY MOUTH MORNING,NOON, & NIGHT    Probiotic Product (PROBIOTIC PO) Take 1 capsule by mouth daily after breakfast.    rosuvastatin (CRESTOR) 5 MG tablet TAKE ONE TABLET BY MOUTH EVERYDAY AT BEDTIME    sennosides-docusate sodium (SENOKOT-S) 8.6-50 MG tablet Take 1-2 tablets by mouth daily as needed for constipation.    [DISCONTINUED] Calcium Carbonate (CALCIUM 500 PO) Take 1 capsule by mouth every other day.     No facility-administered encounter medications on file as of 01/05/2021.   Pharmacist Review  Reviewed chart for medication changes ahead of medication coordination call.  No OVs, Consults, or hospital visits since last care coordination call/Pharmacist visit.  No medication changes indicated  BP Readings from Last 3 Encounters:  08/31/20 116/76  04/06/20 120/72  02/20/20 110/60    Lab Results  Component Value Date   HGBA1C 7.4 (H) 08/31/2020     Patient obtains medications through Vials  30 Days   Last adherence delivery included:  Ezetimibe 10 mg 1 tab daily Pregabalin 75 mg 1 cap three times daily Omeprazole 20 mg 1 cap every morning Rosuvastatin 5 mg 1 tab daily at bedtime Oxcarbazepine 150 mg 1 tab 150 mg 2x daily Potassium Cl ER 20 MEQ 1 tab 3 times daily   Patient is due for next adherence delivery on: 01/14/21.  Called  patient and reviewed medications and coordinated delivery. This delivery to include: Ezetimibe 10 mg 1 tab daily Pregabalin 75 mg 1 cap three times daily Omeprazole 20 mg 1 cap every morning Oxcarbazepine 150 mg 1 tab 150 mg 2x daily Potassium Cl ER 20 MEQ 1 tab 3 times daily   Patient declined the following medications rosuvastatin, patient states she has 50 tabs on hand  Patient needs refills for None ID.  Confirmed delivery date of 01/14/21, advised patient that pharmacy will contact them the morning of delivery.    [facility] Pharmacist Assistant [phone]   Time spent:27

## 2021-02-01 ENCOUNTER — Telehealth: Payer: Self-pay | Admitting: Pharmacist

## 2021-02-01 NOTE — Progress Notes (Signed)
Chronic Care Management Pharmacy Assistant   Name: [name]  MRN: [id number] DOB: [birth year]   Reason for Encounter: Medication [facility] visits:  None in previous 6 months  Medications: Outpatient Encounter Medications as of 02/01/2021  Medication Sig Note   acetaminophen (TYLENOL) 500 MG tablet Take 500 mg by mouth every 6 (six) hours as needed for headache (pain).    albuterol (VENTOLIN HFA) 108 (90 Base) MCG/ACT inhaler Inhale 2 puffs into the lungs every 6 (six) hours as needed for wheezing or shortness of breath.    Alpha-D-Galactosidase (BEANO PO) Take 1-2 tablets by mouth daily as needed (for gas).    aspirin EC 81 MG tablet Take 1 tablet (81 mg total) by mouth daily.    azelastine (ASTELIN) 0.1 % nasal spray Place 2 sprays into both nostrils 2 (two) times daily as needed for rhinitis. Use in each nostril as directed    clonazePAM (KLONOPIN) 0.5 MG tablet TAKE 1/2 TO 1 TABLET TWICE A DAY AS NEEDED FOR ANXIETY    empagliflozin (JARDIANCE) 25 MG TABS tablet Take 1 tablet (25 mg total) by mouth daily before breakfast.    ezetimibe (ZETIA) 10 MG tablet Take 1 tablet (10 mg total) by mouth daily.    glucose blood (ONETOUCH ULTRA) test strip USE UP TO 4 TIMES DAILY AS DIRECTED    Lancets MISC Use to check blood sugars up to 4 times daily as directed. E11.9    lubiprostone ([name]) 24 MCG capsule TAKE 1 CAPSULE (24 MCG TOTAL) BY MOUTH 2 (TWO) TIMES DAILY WITH A MEAL.    meclizine (ANTIVERT) 12.5 MG tablet TAKE 1 TABLET BY MOUTH 3 TIMES DAILY AS NEEDED FOR DIZZINESS.    Multiple Vitamin (MULTIVITAMIN WITH MINERALS) TABS tablet Take 1 tablet by mouth daily after lunch.    nitroGLYCERIN (NITROSTAT) 0.4 MG SL tablet Place 1 tablet (0.4 mg total) under the tongue every 5 (five) minutes as needed for chest pain. 01/05/2020: 2 tablets on 7/2 and 2 tablets on 7/8   omeprazole (PRILOSEC) 20 MG capsule Take 1 capsule (20 mg total) by mouth every morning.    OVER THE  COUNTER MEDICATION Place 1 drop into both eyes 2 (two) times daily as needed (dry eyes). Over the counter eye drop for itching    OXcarbazepine (TRILEPTAL) 150 MG tablet Take ONE tablet by MOUTH TWO times daily.    OZEMPIC, 0.25 OR 0.5 MG/DOSE, 2 MG/1.5ML SOPN Inject 0.375 mLs (0.5 mg total) into the skin once a week. 06/02/2020: Novo Cares PAP   Polyvinyl Alcohol-Povidone (REFRESH OP) Place 1 drop into both eyes daily as needed (dry eyes).    potassium chloride SA (KLOR-CON) 20 MEQ tablet TAKE ONE TABLET BY MOUTH THREE TIMES DAILY    pregabalin (LYRICA) 75 MG capsule TAKE ONE CAPSULE BY MOUTH MORNING,NOON, & NIGHT    Probiotic Product (PROBIOTIC PO) Take 1 capsule by mouth daily after breakfast.    rosuvastatin (CRESTOR) 5 MG tablet TAKE ONE TABLET BY MOUTH EVERYDAY AT BEDTIME    sennosides-docusate sodium (SENOKOT-S) 8.6-50 MG tablet Take 1-2 tablets by mouth daily as needed for constipation.    [DISCONTINUED] Calcium Carbonate (CALCIUM 500 PO) Take 1 capsule by mouth every other day.     No facility-administered encounter medications on file as of 02/01/2021.    Reviewed chart for medication changes ahead of medication coordination call.  No OVs, Consults, or hospital visits since last care coordination call/Pharmacist visit. (If appropriate, list visit  date, provider name)  No medication changes indicated OR if recent visit, treatment plan here.  BP Readings from Last 3 Encounters:  08/31/20 116/76  04/06/20 120/72  02/20/20 110/60    Lab Results  Component Value Date   HGBA1C 7.4 (H) 08/31/2020     Patient obtains medications through Vials  30 Days   Last adherence delivery included:  Ezetimibe 10 mg 1 tab daily Pregabalin 75 mg 1 cap three times daily Omeprazole 20 mg 1 cap every morning Oxcarbazepine 150 mg 1 tab 150 mg 2x daily Potassium Cl ER 20 MEQ 1 tab 3 times daily  Patient is due for next adherence delivery on: 02/12/21.  Called patient and reviewed medications and  coordinated delivery. This delivery to include: Ezetimibe 10 mg 1 tab daily Pregabalin 75 mg 1 cap three times daily Omeprazole 20 mg 1 cap every morning Oxcarbazepine 150 mg 1 tab 150 mg 2x daily Potassium Cl ER 20 MEQ 1 tab 3 times daily Patient needs refills for ezetimbie 10 mg.  Confirmed delivery date of 02/12/21, advised patient that pharmacy will contact them the morning of delivery.   Star Rating Drugs: Rosuvastatin 5 mg last fill 12/14/20 30 ds Jardiance 25 mg last fill 10/28/20 [facility] Pharmacist Assistant [phone]   Time spent:25

## 2021-02-09 ENCOUNTER — Other Ambulatory Visit: Payer: Self-pay | Admitting: Internal Medicine

## 2021-02-09 DIAGNOSIS — I2510 Atherosclerotic heart disease of native coronary artery without angina pectoris: Secondary | ICD-10-CM

## 2021-02-10 ENCOUNTER — Telehealth: Payer: Self-pay | Admitting: Internal Medicine

## 2021-02-10 NOTE — Telephone Encounter (Signed)
    Patient requesting call to discuss OZEMPIC, 0.25 OR 0.5 MG/DOSE, 2 MG/1.5ML SOPN  Please call

## 2021-02-12 DIAGNOSIS — H25012 Cortical age-related cataract, left eye: Secondary | ICD-10-CM | POA: Diagnosis not present

## 2021-02-12 DIAGNOSIS — H43811 Vitreous degeneration, right eye: Secondary | ICD-10-CM | POA: Diagnosis not present

## 2021-02-12 DIAGNOSIS — H2512 Age-related nuclear cataract, left eye: Secondary | ICD-10-CM | POA: Diagnosis not present

## 2021-02-12 DIAGNOSIS — E119 Type 2 diabetes mellitus without complications: Secondary | ICD-10-CM | POA: Diagnosis not present

## 2021-02-16 NOTE — Progress Notes (Signed)
Refill request form for Ozempic 0.5 mg has been completed and sent to [facility] for signature.    Charlene Brooke, CPP notified  Margaretmary Dys, [facility] Pharmacy Assistant [phone]

## 2021-03-01 ENCOUNTER — Other Ambulatory Visit: Payer: Self-pay | Admitting: Internal Medicine

## 2021-03-04 ENCOUNTER — Encounter: Payer: Self-pay | Admitting: Internal Medicine

## 2021-03-04 NOTE — Progress Notes (Signed)
Subjective:    Patient ID: [name], female    DOB: 03-Dec-1940, 80 y.o.   MRN: [id number]  This visit occurred during the SARS-CoV-2 public health emergency.  Safety protocols were in place, including screening questions prior to the visit, additional usage of staff PPE, and extensive cleaning of exam room while observing appropriate contact time as indicated for disinfecting solutions.     HPI The patient is here for follow up of their chronic medical problems, including DM, diabetic neuropathy, CAD, hichol, constipation, anxiety, gerd   Sugar today was 122.  Wants to try freestyle libre if covered.   Had a rash near medial eyes and forehead.    Has a rash on left buttocks x 3 weeks.  It itches. Uncomfortable - no pain.  Using cortisone 10 for itch.  Gets to be a cluster.    Some pain with urination.     Medications and allergies reviewed with patient and updated if appropriate.  Patient Active Problem List   Diagnosis Date Noted   Mild neurocognitive disorder due to multiple etiologies 11/06/2020   Change in stool 01/23/2020   Hypokalemia 01/06/2020   Unstable angina 01/05/2020   Pain in joint of right shoulder 01/21/2019   CAD (coronary artery disease) 05/28/2018   Spondylolisthesis 09/20/2017   Degeneration of lumbar intervertebral disc 08/02/2017   Abdominal distension, gaseous 04/10/2017   Hearing loss 02/01/2017   Osteoarthritis of left knee 01/12/2017   Constipation 01/10/2017   Hypercalcemia 10/12/2016   Chest pain 10/12/2016   Dyspnea on exertion 10/12/2016   Neck pain 09/27/2016   Laryngopharyngeal reflux (LPR) 09/27/2016   Presbycusis of both ears 09/27/2016   Lumbar pain 08/23/2016   Hair loss 08/23/2016   Situational anxiety    Myofascial pain 05/19/2015   Diverticulitis of large intestine 05/12/2014   Diabetic neuropathy 05/12/2014   Seizure disorder ([place]) 05/12/2014   Steatosis of liver 05/12/2014   Type 2 diabetes mellitus  05/12/2014   Cerebrovascular disease 03/13/2013   Allergic rhinitis 03/13/2013   Non-toxic multinodular goiter 05/18/2009   Vertigo 04/13/2009   Irritable bowel syndrome 03/20/2009   Insomnia 03/20/2009   Dyslipidemia 03/18/2009   Obstructive sleep apnea 03/18/2009   Gastroesophageal reflux disease 03/18/2009    Current Outpatient Medications on File Prior to Visit  Medication Sig Dispense Refill   acetaminophen (TYLENOL) 500 MG tablet Take 500 mg by mouth every 6 (six) hours as needed for headache (pain).     albuterol (VENTOLIN HFA) 108 (90 Base) MCG/ACT inhaler Inhale 2 puffs into the lungs every 6 (six) hours as needed for wheezing or shortness of breath.     Alpha-D-Galactosidase (BEANO PO) Take 1-2 tablets by mouth daily as needed (for gas).     aspirin EC 81 MG tablet Take 1 tablet (81 mg total) by mouth daily. 90 tablet 3   azelastine (ASTELIN) 0.1 % nasal spray Place 2 sprays into both nostrils 2 (two) times daily as needed for rhinitis. Use in each nostril as directed     clonazePAM (KLONOPIN) 0.5 MG tablet TAKE 1/2 TO 1 TABLET TWICE A DAY AS NEEDED FOR ANXIETY 60 tablet 0   empagliflozin (JARDIANCE) 25 MG TABS tablet Take 1 tablet (25 mg total) by mouth daily before breakfast. 90 tablet 1   ezetimibe (ZETIA) 10 MG tablet TAKE ONE TABLET BY MOUTH DAILY 90 tablet 0   glucose blood (ONETOUCH ULTRA) test strip USE UP TO 4 TIMES DAILY AS DIRECTED 400 strip  3   Lancets MISC Use to check blood sugars up to 4 times daily as directed. E11.9 400 each 3   lubiprostone ([name]) 24 MCG capsule TAKE 1 CAPSULE (24 MCG TOTAL) BY MOUTH 2 (TWO) TIMES DAILY WITH A MEAL. 180 capsule 1   meclizine (ANTIVERT) 12.5 MG tablet TAKE 1 TABLET BY MOUTH 3 TIMES DAILY AS NEEDED FOR DIZZINESS. 30 tablet 0   Multiple Vitamin (MULTIVITAMIN WITH MINERALS) TABS tablet Take 1 tablet by mouth daily after lunch.     omeprazole (PRILOSEC) 20 MG capsule TAKE ONE CAPSULE BY MOUTH EVERY MORNING 90 capsule 1   OVER THE  COUNTER MEDICATION Place 1 drop into both eyes 2 (two) times daily as needed (dry eyes). Over the counter eye drop for itching     OXcarbazepine (TRILEPTAL) 150 MG tablet Take ONE tablet by MOUTH TWO times daily. 180 tablet 0   OZEMPIC, 0.25 OR 0.5 MG/DOSE, 2 MG/1.5ML SOPN Inject 0.375 mLs (0.5 mg total) into the skin once a week. 1.5 mL 2   Polyvinyl Alcohol-Povidone (REFRESH OP) Place 1 drop into both eyes daily as needed (dry eyes).     potassium chloride SA (KLOR-CON) 20 MEQ tablet TAKE ONE TABLET BY MOUTH THREE TIMES DAILY 270 tablet 1   pregabalin (LYRICA) 75 MG capsule TAKE ONE CAPSULE BY MOUTH MORNING,NOON, & NIGHT 90 capsule 4   Probiotic Product (PROBIOTIC PO) Take 1 capsule by mouth daily after breakfast.     rosuvastatin (CRESTOR) 5 MG tablet TAKE ONE TABLET BY MOUTH EVERYDAY AT BEDTIME 90 tablet 4   sennosides-docusate sodium (SENOKOT-S) 8.6-50 MG tablet Take 1-2 tablets by mouth daily as needed for constipation.     [DISCONTINUED] Calcium Carbonate (CALCIUM 500 PO) Take 1 capsule by mouth every other day.      No current facility-administered medications on file prior to visit.    Past Medical History:  Diagnosis Date   Abdominal distension, gaseous 04/10/2017   Allergic rhinitis 03/13/2013   CAD (coronary artery disease) 05/28/2018   Cerebrovascular disease    Chest pain 10/12/2016   Degeneration of lumbar intervertebral disc 08/02/2017   Diabetic neuropathy 05/12/2014   Did not tolerated cymbalta - hives, itching Tried nortriptyline - not effective Did not tolerate Effexor-nausea gabapentin-not effective    Diverticulitis of large intestine 05/12/2014   Dyslipidemia    Gastroesophageal reflux disease 03/18/2009   Hearing loss 02/01/2017   Hypercalcemia 10/12/2016   Hypertension    Hypokalemia 01/06/2020   Insomnia 03/20/2009   Irritable bowel syndrome 03/20/2009   With chronic constipation   Laryngopharyngeal reflux (LPR) 09/27/2016   Lumbar pain 08/23/2016   Mild neurocognitive  disorder due to multiple etiologies 11/06/2020   Myofascial pain 05/19/2015   Neck pain 09/27/2016   Non-toxic multinodular goiter 05/18/2009   Obstructive sleep apnea 03/18/2009   Not using CPAP   Osteoarthritis of left knee 01/12/2017   Osteoarthritis of shoulder region    Pain in joint of right shoulder 01/21/2019   Presbycusis of both ears 09/27/2016   Seizure disorder    Situational anxiety    Spondylolisthesis 09/20/2017   Steatosis of liver 05/12/2014   Type 2 diabetes mellitus 05/12/2014   Unstable angina 01/05/2020   Vertigo 04/13/2009    Past Surgical History:  Procedure Laterality Date   ABDOMINAL HYSTERECTOMY  1970's   Partial   APPENDECTOMY     CHOLECYSTECTOMY     LEFT HEART CATH AND CORONARY ANGIOGRAPHY N/A 05/28/2018   Procedure: LEFT HEART CATH AND CORONARY  ANGIOGRAPHY;  Surgeon: Belva Crome, MD;  Location: [facility];  Service: Cardiovascular;  Laterality: N/A;   LEFT HEART CATH AND CORONARY ANGIOGRAPHY N/A 01/06/2020   Procedure: LEFT HEART CATH AND CORONARY ANGIOGRAPHY;  Surgeon: Belva Crome, MD;  Location: [facility];  Service: Cardiovascular;  Laterality: N/A;   LUMBAR EPIDURAL INJECTION Left 08/25/2017   SHOULDER SURGERY  2008   LT, post fall    TONSILLECTOMY AND ADENOIDECTOMY     TOTAL KNEE ARTHROPLASTY Left 01/12/2017   Procedure: LEFT TOTAL KNEE ARTHROPLASTY;  Surgeon: Susa Day, MD;  Location: [facility];  Service: Orthopedics;  Laterality: Left;  120 mins    Social History   Socioeconomic History   Marital status: Widowed    Spouse name: Not on file   Number of children: 2   Years of education: 16   Highest education level: Bachelor's degree (e.g., BA, AB, BS)  Occupational History   Occupation: Retired    Comment: admin at [employer]  Tobacco Use   Smoking status: Former    Types: Cigarettes    Quit date: 10/19/1985    Years since quitting: 35.4   Smokeless tobacco: Never  Vaping Use   Vaping Use: Never used  Substance and Sexual  Activity   Alcohol use: Yes    Alcohol/week: 0.0 standard drinks    Comment: rare glass of wine   Drug use: No   Sexual activity: Never  Other Topics Concern   Not on file  Social History Narrative   Patient lives in a one story home.  Has 2 children.  Retired from [employer].   Social Determinants of Health   Financial Resource Strain: Not on file  Food Insecurity: Not on file  Transportation Needs: Not on file  Physical Activity: Not on file  Stress: Not on file  Social Connections: Not on file    Family History  Problem Relation Age of Onset   Arthritis Mother    Heart disease Father    Arthritis Other        Grandmother   Diabetes Other        Grandmother   Colon cancer Neg Hx    Stomach cancer Neg Hx    Pancreatic cancer Neg Hx     Review of Systems  Constitutional:  Negative for fever.  HENT:  Positive for hearing loss.   Eyes:  Negative for visual disturbance.  Respiratory:  Negative for cough, shortness of breath and wheezing.   Cardiovascular:  Negative for chest pain, palpitations (occ depending on activity) and leg swelling.  Gastrointestinal:  Positive for constipation.       No gerd  Neurological:  Positive for dizziness (occ) and headaches (occ).      Objective:   Vitals:   03/05/21 1309  BP: 114/80  Pulse: 62  Temp: 98 F (36.7 C)  SpO2: 96%   BP Readings from Last 3 Encounters:  03/05/21 114/80  08/31/20 116/76  04/06/20 120/72   Wt Readings from Last 3 Encounters:  03/05/21 168 lb (76.2 kg)  08/31/20 163 lb (73.9 kg)  04/06/20 163 lb (73.9 kg)   Body mass index is 30.73 kg/m.   Physical Exam    Constitutional: Appears well-developed and well-nourished. No distress.  HENT:  Head: Normocephalic and atraumatic.  Neck: Neck supple. No tracheal deviation present. No thyromegaly present.  No cervical lymphadenopathy Cardiovascular: Normal rate, regular rhythm and normal heart sounds.   No murmur heard. No carotid bruit .  No  edema Pulmonary/Chest: Effort normal and breath sounds normal. No respiratory distress. No has no wheezes. No rales.  Skin: Skin is warm and dry. Not diaphoretic.  Psychiatric: Normal mood and affect. Behavior is normal.      Assessment & Plan:    See Problem List for Assessment and Plan of chronic medical problems.

## 2021-03-04 NOTE — Patient Instructions (Addendum)
  Blood work was ordered.     MRi of your brain was ordered.    Flu immunization administered today.     Medications changes include :  valtrex 1000 mg twice daily for one week.  Try linzess 72 mcg for  your constipation.   Your prescription(s) have been submitted to your pharmacy. Please take as directed and contact our office if you believe you are having problem(s) with the medication(s).   Please followup in 6 months   Hearing evaluation for your insurance is [place].  To take advantage of this benefit, you must use a [facility] provider and schedule appointments by calling [facility] at [phone].

## 2021-03-05 ENCOUNTER — Telehealth: Payer: Self-pay | Admitting: Pharmacist

## 2021-03-05 ENCOUNTER — Other Ambulatory Visit: Payer: Self-pay

## 2021-03-05 ENCOUNTER — Ambulatory Visit (INDEPENDENT_AMBULATORY_CARE_PROVIDER_SITE_OTHER): Payer: HMO | Admitting: Internal Medicine

## 2021-03-05 VITALS — BP 114/80 | HR 62 | Temp 98.0°F | Ht 62.0 in | Wt 168.0 lb

## 2021-03-05 DIAGNOSIS — F0670 Mild neurocognitive disorder due to known physiological condition without behavioral disturbance: Secondary | ICD-10-CM

## 2021-03-05 DIAGNOSIS — K5900 Constipation, unspecified: Secondary | ICD-10-CM | POA: Diagnosis not present

## 2021-03-05 DIAGNOSIS — E1142 Type 2 diabetes mellitus with diabetic polyneuropathy: Secondary | ICD-10-CM

## 2021-03-05 DIAGNOSIS — K219 Gastro-esophageal reflux disease without esophagitis: Secondary | ICD-10-CM

## 2021-03-05 DIAGNOSIS — B009 Herpesviral infection, unspecified: Secondary | ICD-10-CM | POA: Diagnosis not present

## 2021-03-05 DIAGNOSIS — E785 Hyperlipidemia, unspecified: Secondary | ICD-10-CM | POA: Diagnosis not present

## 2021-03-05 DIAGNOSIS — G3184 Mild cognitive impairment, so stated: Secondary | ICD-10-CM | POA: Diagnosis not present

## 2021-03-05 DIAGNOSIS — R300 Dysuria: Secondary | ICD-10-CM

## 2021-03-05 DIAGNOSIS — I2510 Atherosclerotic heart disease of native coronary artery without angina pectoris: Secondary | ICD-10-CM | POA: Diagnosis not present

## 2021-03-05 DIAGNOSIS — I10 Essential (primary) hypertension: Secondary | ICD-10-CM | POA: Diagnosis not present

## 2021-03-05 LAB — COMPREHENSIVE METABOLIC PANEL
ALT: 23 U/L (ref 0–35)
AST: 27 U/L (ref 0–37)
Albumin: 4.3 g/dL (ref 3.5–5.2)
Alkaline Phosphatase: 68 U/L (ref 39–117)
BUN: 15 mg/dL (ref 6–23)
CO2: 31 mEq/L (ref 19–32)
Calcium: 10.2 mg/dL (ref 8.4–10.5)
Chloride: 100 mEq/L (ref 96–112)
Creatinine, Ser: 0.8 mg/dL (ref 0.40–1.20)
GFR: 70.68 mL/min (ref >60.00)
Glucose, Bld: 99 mg/dL (ref 70–99)
Potassium: 4 mEq/L (ref 3.5–5.1)
Sodium: 138 mEq/L (ref 135–145)
Total Bilirubin: 0.4 mg/dL (ref 0.2–1.2)
Total Protein: 7.1 g/dL (ref 6.0–8.3)

## 2021-03-05 LAB — URINALYSIS, ROUTINE W REFLEX MICROSCOPIC
Bilirubin Urine: NEGATIVE
Hgb urine dipstick: NEGATIVE
Ketones, ur: NEGATIVE
Leukocytes,Ua: NEGATIVE
Nitrite: NEGATIVE
RBC / HPF: NONE SEEN (ref 0–?)
Specific Gravity, Urine: 1.02 (ref 1.000–1.030)
Total Protein, Urine: NEGATIVE
Urine Glucose: >=1000 — AB
Urobilinogen, UA: 0.2 (ref 0.0–1.0)
pH: 5.5 (ref 5.0–8.0)

## 2021-03-05 LAB — HEMOGLOBIN A1C: Hgb A1c MFr Bld: 7 % — ABNORMAL HIGH (ref 4.6–6.5)

## 2021-03-05 MED ORDER — POTASSIUM CHLORIDE CRYS ER 20 MEQ PO TBCR: 20.0000 meq | EXTENDED_RELEASE_TABLET | Freq: Two times a day (BID) | ORAL | 1 refills | Status: DC

## 2021-03-05 MED ORDER — FREESTYLE LIBRE READER DEVI: 0 refills | Status: DC

## 2021-03-05 MED ORDER — FREESTYLE LIBRE SENSOR SYSTEM MISC: 11 refills | Status: DC

## 2021-03-05 MED ORDER — VALACYCLOVIR HCL 1 G PO TABS: 1000.0000 mg | ORAL_TABLET | Freq: Two times a day (BID) | ORAL | 0 refills | Status: AC

## 2021-03-05 MED ORDER — NITROGLYCERIN 0.4 MG SL SUBL: 0.4000 mg | SUBLINGUAL_TABLET | SUBLINGUAL | 2 refills | Status: DC | PRN

## 2021-03-05 NOTE — Assessment & Plan Note (Signed)
Chronic Controlled, stable Continue omeprazole 20 mg daily 

## 2021-03-05 NOTE — Assessment & Plan Note (Signed)
Chronic Check lipid panel  Continue zetia 10 mg qd, crestor 5 mg qd Regular exercise and healthy diet encouraged

## 2021-03-05 NOTE — Assessment & Plan Note (Addendum)
Chronic Not controlled amitiza not helpful,  linzess 145 mcg too strong Trial of linzess 72 mcg daily - samples given

## 2021-03-05 NOTE — Assessment & Plan Note (Addendum)
Chronic Following with cardiology Occasional chest pain - take NTG Continue aspirin 81 mg daily, Zetia 10 mg daily, Crestor 5 mg daily Blood pressure well controlled

## 2021-03-05 NOTE — Progress Notes (Signed)
Chronic Care Management Pharmacy Assistant   Name: [name]  MRN: [id number] DOB: May 14, 1941   Reason for Encounter: Medication Review    Recent office visits:  None ID  Recent consult visits:  None ID  Hospital visits:  None in previous 6 months  Medications: Outpatient Encounter Medications as of 03/05/2021  Medication Sig Note   acetaminophen (TYLENOL) 500 MG tablet Take 500 mg by mouth every 6 (six) hours as needed for headache (pain).    albuterol (VENTOLIN HFA) 108 (90 Base) MCG/ACT inhaler Inhale 2 puffs into the lungs every 6 (six) hours as needed for wheezing or shortness of breath.    Alpha-D-Galactosidase (BEANO PO) Take 1-2 tablets by mouth daily as needed (for gas).    aspirin EC 81 MG tablet Take 1 tablet (81 mg total) by mouth daily.    azelastine (ASTELIN) 0.1 % nasal spray Place 2 sprays into both nostrils 2 (two) times daily as needed for rhinitis. Use in each nostril as directed    clonazePAM (KLONOPIN) 0.5 MG tablet TAKE 1/2 TO 1 TABLET TWICE A DAY AS NEEDED FOR ANXIETY    empagliflozin (JARDIANCE) 25 MG TABS tablet Take 1 tablet (25 mg total) by mouth daily before breakfast.    ezetimibe (ZETIA) 10 MG tablet TAKE ONE TABLET BY MOUTH DAILY    glucose blood (ONETOUCH ULTRA) test strip USE UP TO 4 TIMES DAILY AS DIRECTED    Lancets MISC Use to check blood sugars up to 4 times daily as directed. E11.9    lubiprostone ([name]) 24 MCG capsule TAKE 1 CAPSULE (24 MCG TOTAL) BY MOUTH 2 (TWO) TIMES DAILY WITH A MEAL.    meclizine (ANTIVERT) 12.5 MG tablet TAKE 1 TABLET BY MOUTH 3 TIMES DAILY AS NEEDED FOR DIZZINESS.    Multiple Vitamin (MULTIVITAMIN WITH MINERALS) TABS tablet Take 1 tablet by mouth daily after lunch.    nitroGLYCERIN (NITROSTAT) 0.4 MG SL tablet Place 1 tablet (0.4 mg total) under the tongue every 5 (five) minutes as needed for chest pain. 01/05/2020: 2 tablets on 7/2 and 2 tablets on 7/8   omeprazole (PRILOSEC) 20 MG capsule TAKE ONE  CAPSULE BY MOUTH EVERY MORNING    OVER THE COUNTER MEDICATION Place 1 drop into both eyes 2 (two) times daily as needed (dry eyes). Over the counter eye drop for itching    OXcarbazepine (TRILEPTAL) 150 MG tablet Take ONE tablet by MOUTH TWO times daily.    OZEMPIC, 0.25 OR 0.5 MG/DOSE, 2 MG/1.5ML SOPN Inject 0.375 mLs (0.5 mg total) into the skin once a week. 06/02/2020: [employer] Cares PAP   Polyvinyl Alcohol-Povidone (REFRESH OP) Place 1 drop into both eyes daily as needed (dry eyes).    potassium chloride SA (KLOR-CON) 20 MEQ tablet TAKE ONE TABLET BY MOUTH THREE TIMES DAILY    pregabalin (LYRICA) 75 MG capsule TAKE ONE CAPSULE BY MOUTH MORNING,NOON, & NIGHT    Probiotic Product (PROBIOTIC PO) Take 1 capsule by mouth daily after breakfast.    rosuvastatin (CRESTOR) 5 MG tablet TAKE ONE TABLET BY MOUTH EVERYDAY AT BEDTIME    sennosides-docusate sodium (SENOKOT-S) 8.6-50 MG tablet Take 1-2 tablets by mouth daily as needed for constipation.    [DISCONTINUED] Calcium Carbonate (CALCIUM 500 PO) Take 1 capsule by mouth every other day.     No facility-administered encounter medications on file as of 03/05/2021.    Reviewed chart for medication changes ahead of medication coordination call.  No OVs, Consults, or hospital visits since last  care coordination call/Pharmacist visit.   No medication changes indicated  BP Readings from Last 3 Encounters:  08/31/20 116/76  04/06/20 120/72  02/20/20 110/60    Lab Results  Component Value Date   HGBA1C 7.4 (H) 08/31/2020     Patient obtains medications through Vials  30 Days   Last adherence delivery included:  Ezetimibe 10 mg 1 tab daily Pregabalin 75 mg 1 cap three times daily Omeprazole 20 mg 1 cap every morning Oxcarbazepine 150 mg 1 tab 150 mg 2x daily Potassium Cl ER 20 MEQ 1 tab 3 times daily   Patient is due for next adherence delivery on: 03/15/21.  Called patient and reviewed medications and coordinated delivery. This delivery to  include: Ezetimibe 10 mg 1 tab daily Pregabalin 75 mg 1 cap three times daily Omeprazole 20 mg 1 cap every morning Oxcarbazepine 150 mg 1 tab 150 mg 2x daily Potassium Cl ER 20 MEQ 1 tab 3 times daily Rosuvastatin 5 mg 1 tab at bedtime   Patient needs refills for none noted.  Confirmed delivery date of 03/15/21, advised patient that pharmacy will contact them the morning of delivery.   [place] Pharmacist Assistant [phone]   Time spent:28

## 2021-03-05 NOTE — Assessment & Plan Note (Addendum)
Chronic Lab Results  Component Value Date   HGBA1C 7.4 (H) 08/31/2020   Not ideally controlled Check a1c today and will adjust meds Continue jardiance 25 mg daily, ozempic 0.5 mg - can increase to 1 mg weekly if needed

## 2021-03-05 NOTE — Assessment & Plan Note (Addendum)
Chronic Continue lyrica 75 mg tid

## 2021-03-06 NOTE — Assessment & Plan Note (Signed)
Saw Dr Melvyn Novas 5/22  Will get MRI as recommended to evaluate for vascular dz Has had hearing evaluated - can not afford hearing aids -- her insurances uses a specific company that may be affordable - she will contact them

## 2021-03-06 NOTE — Assessment & Plan Note (Signed)
Acute  Cluster of blister R buttock - pain, itching, burning sensation Start valtrex 1000 mg bdc x 1 week Ck hsv titers

## 2021-03-06 NOTE — Assessment & Plan Note (Signed)
Acute UA, UCx

## 2021-03-08 LAB — URINE CULTURE

## 2021-03-08 LAB — HSV 1 ANTIBODY, IGG: HSV 1 Glycoprotein G Ab, IgG: 1.95 index — ABNORMAL HIGH

## 2021-03-08 LAB — HSV 2 ANTIBODY, IGG: HSV 2 Glycoprotein G Ab, IgG: 21.7 index — ABNORMAL HIGH

## 2021-03-18 ENCOUNTER — Telehealth: Payer: Self-pay

## 2021-03-18 NOTE — Telephone Encounter (Signed)
Patient samples arrived today and patient notified that samples that arrived.  Placed items in fridge in the back.  Patient's name on bag with copy of packing slip.

## 2021-03-24 ENCOUNTER — Ambulatory Visit
Admission: RE | Admit: 2021-03-24 | Discharge: 2021-03-24 | Disposition: A | Discharge status: Home / Self Care | Payer: HMO | Source: Ambulatory Visit | Attending: Internal Medicine | Admitting: Internal Medicine

## 2021-03-24 ENCOUNTER — Other Ambulatory Visit: Payer: Self-pay

## 2021-03-24 DIAGNOSIS — F0670 Mild neurocognitive disorder due to known physiological condition without behavioral disturbance: Secondary | ICD-10-CM

## 2021-03-24 DIAGNOSIS — R519 Headache, unspecified: Secondary | ICD-10-CM | POA: Diagnosis not present

## 2021-03-24 DIAGNOSIS — G3184 Mild cognitive impairment, so stated: Secondary | ICD-10-CM

## 2021-03-26 ENCOUNTER — Other Ambulatory Visit: Payer: Self-pay | Admitting: Internal Medicine

## 2021-03-26 DIAGNOSIS — H7493 Unspecified disorder of middle ear and mastoid, bilateral: Secondary | ICD-10-CM

## 2021-04-05 ENCOUNTER — Telehealth: Payer: Self-pay | Admitting: Pharmacist

## 2021-04-05 DIAGNOSIS — E1142 Type 2 diabetes mellitus with diabetic polyneuropathy: Secondary | ICD-10-CM

## 2021-04-05 NOTE — Progress Notes (Addendum)
Chronic Care Management Pharmacy Assistant   Name: [name]  MRN: [id number] DOB: 05/18/1941    Reason for Encounter: Medication Review    Recent office visits:  03/05/21 Binnie Rail, MD-PCP (hypertension) med changes:valACYclovir (VALTREX) 1000 MG tablet and Linzess 72 mcg, orders: blood work and MRI   Recent consult visits:  None ID  Hospital visits:  None in previous 6 months  Medications: Outpatient Encounter Medications as of 04/05/2021  Medication Sig Note   acetaminophen (TYLENOL) 500 MG tablet Take 500 mg by mouth every 6 (six) hours as needed for headache (pain).    albuterol (VENTOLIN HFA) 108 (90 Base) MCG/ACT inhaler Inhale 2 puffs into the lungs every 6 (six) hours as needed for wheezing or shortness of breath.    Alpha-D-Galactosidase (BEANO PO) Take 1-2 tablets by mouth daily as needed (for gas).    aspirin EC 81 MG tablet Take 1 tablet (81 mg total) by mouth daily.    azelastine (ASTELIN) 0.1 % nasal spray Place 2 sprays into both nostrils 2 (two) times daily as needed for rhinitis. Use in each nostril as directed    clonazePAM (KLONOPIN) 0.5 MG tablet TAKE 1/2 TO 1 TABLET TWICE A DAY AS NEEDED FOR ANXIETY    Continuous Blood Gluc Receiver (FREESTYLE LIBRE READER) DEVI UAD to check sugars E11.9    Continuous Blood Gluc Sensor (FREESTYLE LIBRE SENSOR SYSTEM) MISC UAD to check sugars E11.9    empagliflozin (JARDIANCE) 25 MG TABS tablet Take 1 tablet (25 mg total) by mouth daily before breakfast.    ezetimibe (ZETIA) 10 MG tablet TAKE ONE TABLET BY MOUTH DAILY    glucose blood (ONETOUCH ULTRA) test strip USE UP TO 4 TIMES DAILY AS DIRECTED    Lancets MISC Use to check blood sugars up to 4 times daily as directed. E11.9    meclizine (ANTIVERT) 12.5 MG tablet TAKE 1 TABLET BY MOUTH 3 TIMES DAILY AS NEEDED FOR DIZZINESS.    Multiple Vitamin (MULTIVITAMIN WITH MINERALS) TABS tablet Take 1 tablet by mouth daily after lunch.    nitroGLYCERIN (NITROSTAT)  0.4 MG SL tablet Place 1 tablet (0.4 mg total) under the tongue every 5 (five) minutes as needed for chest pain.    omeprazole (PRILOSEC) 20 MG capsule TAKE ONE CAPSULE BY MOUTH EVERY MORNING    OVER THE COUNTER MEDICATION Place 1 drop into both eyes 2 (two) times daily as needed (dry eyes). Over the counter eye drop for itching    OXcarbazepine (TRILEPTAL) 150 MG tablet Take ONE tablet by MOUTH TWO times daily.    OZEMPIC, 0.25 OR 0.5 MG/DOSE, 2 MG/1.5ML SOPN Inject 0.375 mLs (0.5 mg total) into the skin once a week. 06/02/2020: [facility] Cares PAP   Polyvinyl Alcohol-Povidone (REFRESH OP) Place 1 drop into both eyes daily as needed (dry eyes).    potassium chloride SA (KLOR-CON) 20 MEQ tablet Take 1 tablet (20 mEq total) by mouth 2 (two) times daily.    pregabalin (LYRICA) 75 MG capsule TAKE ONE CAPSULE BY MOUTH MORNING,NOON, & NIGHT    Probiotic Product (PROBIOTIC PO) Take 1 capsule by mouth daily after breakfast.    rosuvastatin (CRESTOR) 5 MG tablet TAKE ONE TABLET BY MOUTH EVERYDAY AT BEDTIME    sennosides-docusate sodium (SENOKOT-S) 8.6-50 MG tablet Take 1-2 tablets by mouth daily as needed for constipation.    [DISCONTINUED] Calcium Carbonate (CALCIUM 500 PO) Take 1 capsule by mouth every other day.     No facility-administered encounter medications on  file as of 04/05/2021.   Reviewed chart for medication changes ahead of medication coordination call.  No OVs, Consults, or hospital visits since last care coordination call/Pharmacist visit. (If appropriate, list visit date, provider name)  No medication changes indicated OR if recent visit, treatment plan here.  BP Readings from Last 3 Encounters:  03/05/21 114/80  08/31/20 116/76  04/06/20 120/72    Lab Results  Component Value Date   HGBA1C 7.0 (H) 03/05/2021     Patient obtains medications through Vials  30 Days    Last adherence delivery included Ezetimibe 10 mg 1 tab daily Pregabalin 75 mg 1 cap three times  daily Omeprazole 20 mg 1 cap every morning Oxcarbazepine 150 mg 1 tab 150 mg 2x daily Potassium Cl ER 20 MEQ 1 tab 3 times daily Rosuvastatin 5 mg 1 tab at bedtime   Patient is due for next adherence delivery on: 04/14/21.  Called patient and reviewed medications and coordinated delivery. This delivery to include: Ezetimibe 10 mg 1 tab daily Pregabalin 75 mg 1 cap three times daily Omeprazole 20 mg 1 cap every morning Oxcarbazepine 150 mg 1 tab 150 mg 2x daily Potassium Cl ER 20 MEQ 1 tab 3 times daily Rosuvastatin 5 mg 1 tab at bedtime Nitroglycerin 0.4 mg 1 tab under tongue as needed for chest pain Freestyle libre sensors  Patient needs refills for none noted.  Patient needs Jardiance 25 mg refilled through [employer].  Confirmed delivery date of 04/14/21, advised patient that pharmacy will contact them the morning of delivery.  [place] Pharmacist Assistant [phone]

## 2021-04-06 ENCOUNTER — Telehealth: Payer: Self-pay

## 2021-04-06 NOTE — Progress Notes (Signed)
Chronic Care Management Pharmacy Assistant   Name: [name]  MRN: [id number] DOB: [birth year]    Reason for Encounter: Disease State   Conditions to be addressed/monitored: general   Recent office visits:  None ID  Recent consult visits:  None ID  Hospital visits:  None in previous 6 months  Medications: Outpatient Encounter Medications as of 04/06/2021  Medication Sig Note   acetaminophen (TYLENOL) 500 MG tablet Take 500 mg by mouth every 6 (six) hours as needed for headache (pain).    albuterol (VENTOLIN HFA) 108 (90 Base) MCG/ACT inhaler Inhale 2 puffs into the lungs every 6 (six) hours as needed for wheezing or shortness of breath.    Alpha-D-Galactosidase (BEANO PO) Take 1-2 tablets by mouth daily as needed (for gas).    aspirin EC 81 MG tablet Take 1 tablet (81 mg total) by mouth daily.    azelastine (ASTELIN) 0.1 % nasal spray Place 2 sprays into both nostrils 2 (two) times daily as needed for rhinitis. Use in each nostril as directed    clonazePAM (KLONOPIN) 0.5 MG tablet TAKE 1/2 TO 1 TABLET TWICE A DAY AS NEEDED FOR ANXIETY    Continuous Blood Gluc Receiver (FREESTYLE LIBRE READER) DEVI UAD to check sugars E11.9    Continuous Blood Gluc Sensor (FREESTYLE LIBRE SENSOR SYSTEM) MISC UAD to check sugars E11.9    empagliflozin (JARDIANCE) 25 MG TABS tablet Take 1 tablet (25 mg total) by mouth daily before breakfast.    ezetimibe (ZETIA) 10 MG tablet TAKE ONE TABLET BY MOUTH DAILY    glucose blood (ONETOUCH ULTRA) test strip USE UP TO 4 TIMES DAILY AS DIRECTED    Lancets MISC Use to check blood sugars up to 4 times daily as directed. E11.9    meclizine (ANTIVERT) 12.5 MG tablet TAKE 1 TABLET BY MOUTH 3 TIMES DAILY AS NEEDED FOR DIZZINESS.    Multiple Vitamin (MULTIVITAMIN WITH MINERALS) TABS tablet Take 1 tablet by mouth daily after lunch.    nitroGLYCERIN (NITROSTAT) 0.4 MG SL tablet Place 1 tablet (0.4 mg total) under the tongue every 5 (five) minutes as  needed for chest pain.    omeprazole (PRILOSEC) 20 MG capsule TAKE ONE CAPSULE BY MOUTH EVERY MORNING    OVER THE COUNTER MEDICATION Place 1 drop into both eyes 2 (two) times daily as needed (dry eyes). Over the counter eye drop for itching    OXcarbazepine (TRILEPTAL) 150 MG tablet Take ONE tablet by MOUTH TWO times daily.    OZEMPIC, 0.25 OR 0.5 MG/DOSE, 2 MG/1.5ML SOPN Inject 0.375 mLs (0.5 mg total) into the skin once a week. 06/02/2020: Novo Cares PAP   Polyvinyl Alcohol-Povidone (REFRESH OP) Place 1 drop into both eyes daily as needed (dry eyes).    potassium chloride SA (KLOR-CON) 20 MEQ tablet Take 1 tablet (20 mEq total) by mouth 2 (two) times daily.    pregabalin (LYRICA) 75 MG capsule TAKE ONE CAPSULE BY MOUTH MORNING,NOON, & NIGHT    Probiotic Product (PROBIOTIC PO) Take 1 capsule by mouth daily after breakfast.    rosuvastatin (CRESTOR) 5 MG tablet TAKE ONE TABLET BY MOUTH EVERYDAY AT BEDTIME    sennosides-docusate sodium (SENOKOT-S) 8.6-50 MG tablet Take 1-2 tablets by mouth daily as needed for constipation.    [DISCONTINUED] Calcium Carbonate (CALCIUM 500 PO) Take 1 capsule by mouth every other day.     No facility-administered encounter medications on file as of 04/06/2021.   Have you had any problems recently with your  health? Patient reports that she has been having some issues with her ears full of fluid and it is making it hard for her to hear. Patient states that she will be seeing an ear, nose throat doctor 05/28/21 to assess that problem  Have you had any problems with your pharmacy? Patient states that she does not have any problems with getting her medications or the cost of medications from the pharmacy  What issues or side effects are you having with your medications? Patient states that she does not have any side effects from medications  What would you like me to pass along to [facility] for them to help you with? Patient states that she is doing well other than  her ears being stop up she is fine  What can we do to take care of you better?  Patient states not at this time  Care Gaps: Colonoscopy-NA Diabetic Foot Exam-02/25/21 Mammogram-NA Ophthalmology-11/01/18 Dexa Scan - NA Annual Well Visit -  Micro albumin-3.1, 08/31/20 Hemoglobin A1c- 7.0, 03/05/21  Star Rating Drugs: Jardiance 25 mg-last fill 03/17/21 30 ds Rosuvastatin 5 mg-last fill 03/11/21 30 ds Ozempic 0.5 mg-last fill   [place] Pharmacist Assistant [phone]

## 2021-04-08 MED ORDER — EMPAGLIFLOZIN 25 MG PO TABS: 25.0000 mg | ORAL_TABLET | Freq: Every day | ORAL | 3 refills | Status: DC

## 2021-04-08 NOTE — Telephone Encounter (Signed)
Contacted [facility] to request refill for Jardiance. They are out of refills and need a new Rx. Printed and faxed new Rx.

## 2021-04-08 NOTE — Addendum Note (Signed)
Addended by: Charlton Haws on: 04/08/2021 04:31 PM   Modules accepted: Orders

## 2021-04-08 NOTE — Progress Notes (Signed)
    Chronic Care Management Pharmacy Assistant   Name: Gordana Kewley  MRN: [id number] DOB: 06-15-1941  Spoke with representative from [facility] about Jardiance refills who stated that patient does not have anymore refills and last refill was in March. New prescription has to be sent in with refills and shipment will be mailed to patient address.  [place] Pharmacist Assistant [phone]

## 2021-04-28 ENCOUNTER — Other Ambulatory Visit: Payer: Self-pay

## 2021-04-28 ENCOUNTER — Ambulatory Visit (INDEPENDENT_AMBULATORY_CARE_PROVIDER_SITE_OTHER): Payer: HMO

## 2021-04-28 DIAGNOSIS — Z0000 Encounter for general adult medical examination without abnormal findings: Secondary | ICD-10-CM

## 2021-04-28 NOTE — Progress Notes (Signed)
I connected with [name] today by telephone and verified that I am speaking with the correct person using two identifiers. Location patient: home Location provider: work Persons participating in the virtual visit: patient, provider.   I discussed the limitations, risks, security and privacy concerns of performing an evaluation and management service by telephone and the availability of in person appointments. I also discussed with the patient that there may be a patient responsible charge related to this service. The patient expressed understanding and verbally consented to this telephonic visit.    Interactive audio and video telecommunications were attempted between this provider and patient, however failed, due to patient having technical difficulties OR patient did not have access to video capability.  We continued and completed visit with audio only.  Some vital signs may be absent or patient reported.   Time Spent with patient on telephone encounter: 40 minutes  Subjective:   [name] is a 80 y.o. female who presents for Medicare Annual (Subsequent) preventive examination.  Review of Systems     Cardiac Risk Factors include: advanced age (>94men, >43 women);diabetes mellitus;dyslipidemia;family history of premature cardiovascular disease;hypertension     Objective:    There were no vitals filed for this visit. There is no height or weight on file to calculate BMI.  Advanced Directives 04/28/2021 01/06/2020 01/22/2019 11/05/2018 05/26/2018 05/26/2018 04/14/2018  Does Patient Have a Medical Advance Directive? No Yes No Yes No No No  Type of Advance Directive - Living will;Healthcare Power of [place];Living will - - -  Does patient want to make changes to medical advance directive? - No - Patient declined - - - - -  Copy of [place] in Chart? - No - copy requested - No - copy requested - - -  Would patient like  information on creating a medical advance directive? No - Patient declined - Yes (MAU/Ambulatory/Procedural Areas - Information given) - Yes (Inpatient - patient requests chaplain consult to create a medical advance directive) No - Patient declined -    Current Medications (verified) Outpatient Encounter Medications as of 04/28/2021  Medication Sig   acetaminophen (TYLENOL) 500 MG tablet Take 500 mg by mouth every 6 (six) hours as needed for headache (pain).   albuterol (VENTOLIN HFA) 108 (90 Base) MCG/ACT inhaler Inhale 2 puffs into the lungs every 6 (six) hours as needed for wheezing or shortness of breath.   Alpha-D-Galactosidase (BEANO PO) Take 1-2 tablets by mouth daily as needed (for gas).   aspirin EC 81 MG tablet Take 1 tablet (81 mg total) by mouth daily.   azelastine (ASTELIN) 0.1 % nasal spray Place 2 sprays into both nostrils 2 (two) times daily as needed for rhinitis. Use in each nostril as directed   clonazePAM (KLONOPIN) 0.5 MG tablet TAKE 1/2 TO 1 TABLET TWICE A DAY AS NEEDED FOR ANXIETY   Continuous Blood Gluc Receiver (FREESTYLE LIBRE READER) DEVI UAD to check sugars E11.9   Continuous Blood Gluc Sensor (FREESTYLE LIBRE SENSOR SYSTEM) MISC UAD to check sugars E11.9   empagliflozin (JARDIANCE) 25 MG TABS tablet Take 1 tablet (25 mg total) by mouth daily before breakfast. Via [employer] pt assistance   ezetimibe (ZETIA) 10 MG tablet TAKE ONE TABLET BY MOUTH DAILY   glucose blood (ONETOUCH ULTRA) test strip USE UP TO 4 TIMES DAILY AS DIRECTED   Lancets MISC Use to check blood sugars up to 4 times daily as directed. E11.9   meclizine (ANTIVERT)  12.5 MG tablet TAKE 1 TABLET BY MOUTH 3 TIMES DAILY AS NEEDED FOR DIZZINESS.   Multiple Vitamin (MULTIVITAMIN WITH MINERALS) TABS tablet Take 1 tablet by mouth daily after lunch.   nitroGLYCERIN (NITROSTAT) 0.4 MG SL tablet Place 1 tablet (0.4 mg total) under the tongue every 5 (five) minutes as needed for chest pain.   omeprazole (PRILOSEC) 20  MG capsule TAKE ONE CAPSULE BY MOUTH EVERY MORNING   OVER THE COUNTER MEDICATION Place 1 drop into both eyes 2 (two) times daily as needed (dry eyes). Over the counter eye drop for itching   OXcarbazepine (TRILEPTAL) 150 MG tablet Take ONE tablet by MOUTH TWO times daily.   OZEMPIC, 0.25 OR 0.5 MG/DOSE, 2 MG/1.5ML SOPN Inject 0.375 mLs (0.5 mg total) into the skin once a week.   Polyvinyl Alcohol-Povidone (REFRESH OP) Place 1 drop into both eyes daily as needed (dry eyes).   potassium chloride SA (KLOR-CON) 20 MEQ tablet Take 1 tablet (20 mEq total) by mouth 2 (two) times daily.   pregabalin (LYRICA) 75 MG capsule TAKE ONE CAPSULE BY MOUTH MORNING,NOON, & NIGHT   Probiotic Product (PROBIOTIC PO) Take 1 capsule by mouth daily after breakfast.   rosuvastatin (CRESTOR) 5 MG tablet TAKE ONE TABLET BY MOUTH EVERYDAY AT BEDTIME   sennosides-docusate sodium (SENOKOT-S) 8.6-50 MG tablet Take 1-2 tablets by mouth daily as needed for constipation.   [DISCONTINUED] Calcium Carbonate (CALCIUM 500 PO) Take 1 capsule by mouth every other day.    No facility-administered encounter medications on file as of 04/28/2021.    Allergies (verified) Cymbalta [duloxetine hcl], Effexor [venlafaxine], Linzess [linaclotide], Other, Statins, and Penicillins   History: Past Medical History:  Diagnosis Date   Abdominal distension, gaseous 04/10/2017   Allergic rhinitis 03/13/2013   CAD (coronary artery disease) 05/28/2018   Cerebrovascular disease    Chest pain 10/12/2016   Degeneration of lumbar intervertebral disc 08/02/2017   Diabetic neuropathy 05/12/2014   Did not tolerated cymbalta - hives, itching Tried nortriptyline - not effective Did not tolerate Effexor-nausea gabapentin-not effective    Diverticulitis of large intestine 05/12/2014   Dyslipidemia    Gastroesophageal reflux disease 03/18/2009   Hearing loss 02/01/2017   Hypercalcemia 10/12/2016   Hypertension    Hypokalemia 01/06/2020   Insomnia 03/20/2009    Irritable bowel syndrome 03/20/2009   With chronic constipation   Laryngopharyngeal reflux (LPR) 09/27/2016   Lumbar pain 08/23/2016   Mild neurocognitive disorder due to multiple etiologies 11/06/2020   Myofascial pain 05/19/2015   Neck pain 09/27/2016   Non-toxic multinodular goiter 05/18/2009   Obstructive sleep apnea 03/18/2009   Not using CPAP   Osteoarthritis of left knee 01/12/2017   Osteoarthritis of shoulder region    Pain in joint of right shoulder 01/21/2019   Presbycusis of both ears 09/27/2016   Seizure disorder    Situational anxiety    Spondylolisthesis 09/20/2017   Steatosis of liver 05/12/2014   Type 2 diabetes mellitus 05/12/2014   Unstable angina 01/05/2020   Vertigo 04/13/2009   Past Surgical History:  Procedure Laterality Date   ABDOMINAL HYSTERECTOMY  1970's   Partial   APPENDECTOMY     CHOLECYSTECTOMY     LEFT HEART CATH AND CORONARY ANGIOGRAPHY N/A 05/28/2018   Procedure: LEFT HEART CATH AND CORONARY ANGIOGRAPHY;  Surgeon: Belva Crome, MD;  Location: [facility];  Service: Cardiovascular;  Laterality: N/A;   LEFT HEART CATH AND CORONARY ANGIOGRAPHY N/A 01/06/2020   Procedure: LEFT HEART CATH AND CORONARY ANGIOGRAPHY;  Surgeon: Belva Crome, MD;  Location: [facility];  Service: Cardiovascular;  Laterality: N/A;   LUMBAR EPIDURAL INJECTION Left 08/25/2017   SHOULDER SURGERY  2008   LT, post fall    TONSILLECTOMY AND ADENOIDECTOMY     TOTAL KNEE ARTHROPLASTY Left 01/12/2017   Procedure: LEFT TOTAL KNEE ARTHROPLASTY;  Surgeon: Susa Day, MD;  Location: [facility];  Service: Orthopedics;  Laterality: Left;  120 mins   Family History  Problem Relation Age of Onset   Arthritis Mother    Heart disease Father    Arthritis Other        Grandmother   Diabetes Other        Grandmother   Colon cancer Neg Hx    Stomach cancer Neg Hx    Pancreatic cancer Neg Hx    Social History   Socioeconomic History   Marital status: Widowed    Spouse name: Not  on file   Number of children: 2   Years of education: 16   Highest education level: Bachelor's degree (e.g., BA, AB, BS)  Occupational History   Occupation: Retired    Comment: admin at [employer]  Tobacco Use   Smoking status: Former    Types: Cigarettes    Quit date: 10/19/1985    Years since quitting: 35.5   Smokeless tobacco: Never  Vaping Use   Vaping Use: Never used  Substance and Sexual Activity   Alcohol use: Yes    Alcohol/week: 0.0 standard drinks    Comment: rare glass of wine   Drug use: No   Sexual activity: Never  Other Topics Concern   Not on file  Social History Narrative   Patient lives in a one story home.  Has 2 children.  Retired from [employer].   Social Determinants of Health   Financial Resource Strain: Low Risk    Difficulty of Paying Living Expenses: Not hard at all  Food Insecurity: No Food Insecurity   Worried About Charity fundraiser in the Last Year: Never true   [place] in the Last Year: Never true  Transportation Needs: No Transportation Needs   Lack of Transportation (Medical): No   Lack of Transportation (Non-Medical): No  Physical Activity: Sufficiently Active   Days of Exercise per Week: 5 days   Minutes of Exercise per Session: 30 min  Stress: No Stress Concern Present   Feeling of Stress : Not at all  Social Connections: Socially Integrated   Frequency of Communication with Friends and Family: More than three times a week   Frequency of Social Gatherings with Friends and Family: More than three times a week   Attends Religious Services: More than 4 times per year   Active Member of [employer] or Organizations: Yes   Attends Music therapist: More than 4 times per year   Marital Status: Married    Tobacco Counseling Counseling given: Not Answered   Clinical Intake:  Pre-visit preparation completed: Yes  Pain : No/denies pain     Nutritional Risks: None Diabetes: Yes CBG done?: No Did pt. bring in CBG monitor  from home?: No  How often do you need to have someone help you when you read instructions, pamphlets, or other written materials from your doctor or pharmacy?: 1 - Never What is the last grade level you completed in school?: Bachelor's Degree from [employer]  Diabetic? yes  Interpreter Needed?: No  Information entered by :: Lisette Abu, LPN  Activities of Daily Living In your present state of health, do you have any difficulty performing the following activities: 04/28/2021 03/05/2021  Hearing? N N  Vision? N N  Difficulty concentrating or making decisions? N N  Walking or climbing stairs? N N  Dressing or bathing? N N  Doing errands, shopping? N N  Preparing Food and eating ? N -  Using the Toilet? N -  In the past six months, have you accidently leaked urine? N -  Do you have problems with loss of bowel control? N -  Managing your Medications? N -  Housekeeping or managing your Housekeeping? N -  Some recent data might be hidden    Patient Care Team: Binnie Rail, MD as PCP - General (Internal Medicine) Belva Crome, MD as PCP - Cardiology (Cardiology) Susa Day, MD (Orthopedic Surgery) Izora Gala, MD Blanch Media, MD (Neurology) Satira Sark, MD (Cardiology) Chesley Mires, MD (Pulmonary Disease) Erline Levine, MD (Neurosurgery) Druscilla Brownie, MD (Dermatology) Charlton Haws, [facility] as Pharmacist (Pharmacist) Katy Fitch, Darlina Guys, MD as Consulting Physician (Ophthalmology)  Indicate any recent Medical Services you may have received from other than [facility] providers in the past year (date may be approximate).     Assessment:   This is a routine wellness examination for Nakesha.  Hearing/Vision screen Hearing Screening - Comments:: Patient denied any hearing difficulty.   No hearing aids.  Vision Screening - Comments:: Patient wears corrective glasses/contacts.  Eye exam done annually by: Debbra Riding, MD.  Dietary  issues and exercise activities discussed: Current Exercise Habits: Home exercise routine, Type of exercise: walking;stretching;Other - see comments (exercise mat), Time (Minutes): 30, Frequency (Times/Week): 5, Weekly Exercise (Minutes/Week): 150, Intensity: Mild, Exercise limited by: neurologic condition(s)   Goals Addressed               This Visit's Progress     Patient Stated (pt-stated)        My goal is to continue to work on my HgA1C and arthritis.  By watching my sugar intake and doing exercises to eliminate the arthritis pain.      Depression Screen PHQ 2/9 Scores 04/28/2021 02/06/2020 08/13/2019 01/22/2019 11/05/2018 10/11/2017 04/10/2017  PHQ - 2 Score 0 0 0 0 0 1 0  PHQ- 9 Score - - - - - 4 -    Fall Risk Fall Risk  04/28/2021 08/31/2020 11/05/2018 07/09/2018 10/11/2017  Falls in the past year? 0 0 0 0 No  Number falls in past yr: 0 0 0 0 -  Injury with Fall? 0 0 - 0 -  Risk for fall due to : No Fall Risks No Fall Risks - - Impaired mobility  Follow up Falls evaluation completed Falls evaluation completed - Falls evaluation completed -    FALL RISK PREVENTION PERTAINING TO THE HOME:  Any stairs in or around the home? No  If so, are there any without handrails? No  Home free of loose throw rugs in walkways, pet beds, electrical cords, etc? Yes  Adequate lighting in your home to reduce risk of falls? Yes   ASSISTIVE DEVICES UTILIZED TO PREVENT FALLS:  Life alert? Yes  Use of a cane, walker or w/c? Yes  Grab bars in the bathroom? Yes  Shower chair or bench in shower? No  Elevated toilet seat or a handicapped toilet? No   TIMED UP AND GO:  Was the test performed? No .  Length of time to ambulate 10 feet: n/a sec.  Gait slow and steady with assistive device  Cognitive Function: Patient has current diagnosis of mild neurocognitive impairment.   MMSE - Mini Mental State Exam 11/05/2018 10/11/2017  Orientation to time 5 5  Orientation to Place 5 5  Registration 3 3   Attention/ Calculation 4 5  Recall 3 2  Language- name 2 objects 2 2  Language- repeat 1 1  Language- follow 3 step command 3 3  Language- read & follow direction 1 1  Write a sentence 1 1  Copy design 1 1  Total score 29 29        Immunizations Immunization History  Administered Date(s) Administered   Fluad Quad(high Dose 65+) 03/20/2019   Influenza Split 03/28/2011, 02/26/2012, 03/20/2013   Influenza Whole 02/25/2009, 04/01/2010   Influenza, High Dose Seasonal PF 03/03/2017, 05/28/2018, 03/12/2020   Influenza-Unspecified 09/05/2014, 03/27/2016, 02/25/2017, 03/05/2021   PFIZER Comirnaty(Gray Top)Covid-19 Tri-Sucrose Vaccine 11/15/2020   PFIZER(Purple Top)SARS-COV-2 Vaccination 07/16/2019, 08/06/2019, 03/23/2020   Pneumococcal Conjugate-13 09/14/2015   Pneumococcal Polysaccharide-23 06/28/2007, 10/11/2017   Tetanus 05/02/2012   Zoster Recombinat (Shingrix) 01/27/2020, 05/19/2020    TDAP status: Due, Education has been provided regarding the importance of this vaccine. Advised may receive this vaccine at local pharmacy or Health Dept. Aware to provide a copy of the vaccination record if obtained from local pharmacy or Health Dept. Verbalized acceptance and understanding.  Flu Vaccine status: Up to date  Pneumococcal vaccine status: Up to date  Covid-19 vaccine status: Completed vaccines  Qualifies for Shingles Vaccine? Yes   Zostavax completed No   Shingrix Completed?: Yes  Screening Tests Health Maintenance  Topic Date Due   Hepatitis C Screening  Never done   OPHTHALMOLOGY EXAM  11/01/2019   COVID-19 Vaccine (5 - Booster for Pfizer series) 01/10/2021   URINE MICROALBUMIN  08/31/2021   HEMOGLOBIN A1C  09/02/2021   FOOT EXAM  02/25/2022   TETANUS/TDAP  05/02/2022   Pneumonia Vaccine 8+ Years old  Completed   INFLUENZA VACCINE  Completed   DEXA SCAN  Completed   Zoster Vaccines- Shingrix  Completed   HPV VACCINES  Aged Out    Health Maintenance  Health  Maintenance Due  Topic Date Due   Hepatitis C Screening  Never done   OPHTHALMOLOGY EXAM  11/01/2019   COVID-19 Vaccine (5 - Booster for Pfizer series) 01/10/2021    Colorectal cancer screening: No longer required.   Mammogram status: Completed 05/30/2019. Repeat every year  Bone Density status: Completed 01/17/2018. Results reflect: Bone density results: OSTEOPENIA. Repeat every 2-3 years.  Lung Cancer Screening: (Low Dose CT Chest recommended if Age 53-80 years, 30 pack-year currently smoking OR have quit w/in 15years.) does not qualify.   Lung Cancer Screening Referral: no  Additional Screening:  Hepatitis C Screening: does qualify; Completed no  Vision Screening: Recommended annual ophthalmology exams for early detection of glaucoma and other disorders of the eye. Is the patient up to date with their annual eye exam?  Yes  Who is the provider or what is the name of the office in which the patient attends annual eye exams? Richard Wyatt Portela, MD. If pt is not established with a provider, would they like to be referred to a provider to establish care? No .   Dental Screening: Recommended annual dental exams for proper oral hygiene  Community Resource Referral / Chronic Care Management: CRR required this visit?  No   CCM required this visit?  No      Plan:  I have personally reviewed and noted the following in the patient's chart:   Medical and social history Use of alcohol, tobacco or illicit drugs  Current medications and supplements including opioid prescriptions.  Functional ability and status Nutritional status Physical activity Advanced directives List of other physicians Hospitalizations, surgeries, and ER visits in previous 12 months Vitals Screenings to include cognitive, depression, and falls Referrals and appointments  In addition, I have reviewed and discussed with patient certain preventive protocols, quality metrics, and best practice  recommendations. A written personalized care plan for preventive services as well as general preventive health recommendations were provided to patient.     Sheral Flow, LPN   [id number]/08/[id number]   Nurse Notes:  There were no vitals filed for this visit. There is no height or weight on file to calculate BMI. Hearing Screening - Comments:: Patient denied any hearing difficulty.   No hearing aids.  Vision Screening - Comments:: Patient wears corrective glasses/contacts.  Eye exam done annually by: Debbra Riding, MD.

## 2021-05-03 ENCOUNTER — Telehealth: Payer: Self-pay | Admitting: Pharmacist

## 2021-05-03 DIAGNOSIS — K5900 Constipation, unspecified: Secondary | ICD-10-CM

## 2021-05-03 NOTE — Progress Notes (Addendum)
Chronic Care Management Pharmacy Assistant   Name: [name]  MRN: [id number] DOB: [birth year]   Reason for Encounter: Medication Review    Recent office visits:  None ID  Recent consult visits:  None ID  Hospital visits:  None in previous 6 months  Medications: Outpatient Encounter Medications as of 05/03/2021  Medication Sig Note   acetaminophen (TYLENOL) 500 MG tablet Take 500 mg by mouth every 6 (six) hours as needed for headache (pain).    albuterol (VENTOLIN HFA) 108 (90 Base) MCG/ACT inhaler Inhale 2 puffs into the lungs every 6 (six) hours as needed for wheezing or shortness of breath.    Alpha-D-Galactosidase (BEANO PO) Take 1-2 tablets by mouth daily as needed (for gas).    aspirin EC 81 MG tablet Take 1 tablet (81 mg total) by mouth daily.    azelastine (ASTELIN) 0.1 % nasal spray Place 2 sprays into both nostrils 2 (two) times daily as needed for rhinitis. Use in each nostril as directed    clonazePAM (KLONOPIN) 0.5 MG tablet TAKE 1/2 TO 1 TABLET TWICE A DAY AS NEEDED FOR ANXIETY    Continuous Blood Gluc Receiver (FREESTYLE LIBRE READER) DEVI UAD to check sugars E11.9    Continuous Blood Gluc Sensor (FREESTYLE LIBRE SENSOR SYSTEM) MISC UAD to check sugars E11.9    empagliflozin (JARDIANCE) 25 MG TABS tablet Take 1 tablet (25 mg total) by mouth daily before breakfast. Via [employer] pt assistance    ezetimibe (ZETIA) 10 MG tablet TAKE ONE TABLET BY MOUTH DAILY    glucose blood (ONETOUCH ULTRA) test strip USE UP TO 4 TIMES DAILY AS DIRECTED    Lancets MISC Use to check blood sugars up to 4 times daily as directed. E11.9    meclizine (ANTIVERT) 12.5 MG tablet TAKE 1 TABLET BY MOUTH 3 TIMES DAILY AS NEEDED FOR DIZZINESS.    Multiple Vitamin (MULTIVITAMIN WITH MINERALS) TABS tablet Take 1 tablet by mouth daily after lunch.    nitroGLYCERIN (NITROSTAT) 0.4 MG SL tablet Place 1 tablet (0.4 mg total) under the tongue every 5 (five) minutes as needed for chest pain.     omeprazole (PRILOSEC) 20 MG capsule TAKE ONE CAPSULE BY MOUTH EVERY MORNING    OVER THE COUNTER MEDICATION Place 1 drop into both eyes 2 (two) times daily as needed (dry eyes). Over the counter eye drop for itching    OXcarbazepine (TRILEPTAL) 150 MG tablet Take ONE tablet by MOUTH TWO times daily.    OZEMPIC, 0.25 OR 0.5 MG/DOSE, 2 MG/1.5ML SOPN Inject 0.375 mLs (0.5 mg total) into the skin once a week. 06/02/2020: [facility] [facility] PAP   Polyvinyl Alcohol-Povidone (REFRESH OP) Place 1 drop into both eyes daily as needed (dry eyes).    potassium chloride SA (KLOR-CON) 20 MEQ tablet Take 1 tablet (20 mEq total) by mouth 2 (two) times daily.    pregabalin (LYRICA) 75 MG capsule TAKE ONE CAPSULE BY MOUTH MORNING,NOON, & NIGHT    Probiotic Product (PROBIOTIC PO) Take 1 capsule by mouth daily after breakfast.    rosuvastatin (CRESTOR) 5 MG tablet TAKE ONE TABLET BY MOUTH EVERYDAY AT BEDTIME    sennosides-docusate sodium (SENOKOT-S) 8.6-50 MG tablet Take 1-2 tablets by mouth daily as needed for constipation.    [DISCONTINUED] Calcium Carbonate (CALCIUM 500 PO) Take 1 capsule by mouth every other day.     No facility-administered encounter medications on file as of 05/03/2021.    BP Readings from Last 3 Encounters:  03/05/21 114/80  08/31/20 116/76  04/06/20 120/72    Lab Results  Component Value Date   HGBA1C 7.0 (H) 03/05/2021      Last adherence delivery date:04/07/21      Patient is due for next adherence delivery on: 05/13/21  Spoke with patient on 05/03/21 reviewed medications and coordinated delivery.  This delivery to include: Vials  30 Days  Ezetimibe 10 mg 1 tab daily Pregabalin 75 mg 1 cap three times daily Omeprazole 20 mg 1 cap every morning Oxcarbazepine 150 mg 1 tab 150 mg 2x daily Rosuvastatin 5 mg 1 tab at bedtime Nitroglycerin 0.4 mg 1 tab under tongue as needed for chest pain Freestyle libre sensors  Patient declined the following medications this month:Potassium,  patient states she has plenty on hand  Any concerns about your medications? No  How often do you forget or accidentally miss a dose?  Patient states that sometimes she can forget but it maybe because she got busy  Do you use a pillbox? No  Is patient in packaging No    Refills requested from providers include:Oxcarbazepine 150 mg-cpp to request  Confirmed delivery date of 05/13/21, advised patient that pharmacy will contact them the morning of delivery.  Recent blood pressure readings are as follows:NA   Recent blood glucose readings are as follows:123- 05/02/21   Annual wellness visit in last year? No Most Recent BP reading:114/80-03/05/21  If Diabetic: Most recent A1C reading:03/05/21 Last eye exam / retinopathy screening:11/01/18 Last diabetic foot exam: 02/25/21  Star Rating Drugs: Rosuvastatin 5 mg-last fill 04/07/21 30 ds  Ethelene Hal Clinical Pharmacist Assistant [phone]

## 2021-05-06 ENCOUNTER — Other Ambulatory Visit: Payer: Self-pay | Admitting: Internal Medicine

## 2021-05-06 DIAGNOSIS — I2510 Atherosclerotic heart disease of native coronary artery without angina pectoris: Secondary | ICD-10-CM

## 2021-05-11 ENCOUNTER — Telehealth: Payer: Self-pay

## 2021-05-11 MED ORDER — LUBIPROSTONE 24 MCG PO CAPS: 24.0000 ug | ORAL_CAPSULE | Freq: Two times a day (BID) | ORAL | 0 refills | Status: DC

## 2021-05-11 NOTE — Progress Notes (Signed)
    Chronic Care Management Pharmacy Assistant   Name: [name]  MRN: [id number] DOB: 1941-04-22  Patient is currently enrolled in  [place] cares for [place], [employer] for  [place] and Bernita Buffy for [employer]  patient assistance program until date: 06/26/21 Patient would like to re-enroll in patient assistance for the next calendar year.  Renewal forms were completed on behalf of the patient. Patient has requested to receive forms in the mail to complete. Forms will be printed and mailed to patient.Patient stated she will fill out and mail back to the office to be faxed along with proof of income.  [place] Pharmacist Assistant [phone]

## 2021-05-11 NOTE — Addendum Note (Signed)
Addended by: Charlton Haws on: 05/11/2021 02:20 PM   Modules accepted: Orders

## 2021-05-11 NOTE — Telephone Encounter (Signed)
Pt reports she has been taking [name] and would like it refilled. She can get this free through [place] pt assistance. Ordered refill to [facility].

## 2021-05-13 ENCOUNTER — Telehealth: Payer: HMO

## 2021-05-18 ENCOUNTER — Telehealth: Payer: HMO

## 2021-05-18 ENCOUNTER — Other Ambulatory Visit: Payer: Self-pay | Admitting: Internal Medicine

## 2021-05-28 DIAGNOSIS — H6123 Impacted cerumen, bilateral: Secondary | ICD-10-CM | POA: Diagnosis not present

## 2021-05-28 DIAGNOSIS — J310 Chronic rhinitis: Secondary | ICD-10-CM | POA: Diagnosis not present

## 2021-05-28 DIAGNOSIS — H6983 Other specified disorders of Eustachian tube, bilateral: Secondary | ICD-10-CM | POA: Diagnosis not present

## 2021-05-28 DIAGNOSIS — J343 Hypertrophy of nasal turbinates: Secondary | ICD-10-CM | POA: Diagnosis not present

## 2021-05-28 DIAGNOSIS — H6523 Chronic serous otitis media, bilateral: Secondary | ICD-10-CM | POA: Diagnosis not present

## 2021-06-08 ENCOUNTER — Other Ambulatory Visit: Payer: Self-pay | Admitting: Internal Medicine

## 2021-06-10 ENCOUNTER — Telehealth: Payer: HMO

## 2021-06-14 ENCOUNTER — Telehealth: Payer: Self-pay | Admitting: Internal Medicine

## 2021-06-14 DIAGNOSIS — K5900 Constipation, unspecified: Secondary | ICD-10-CM

## 2021-06-14 NOTE — Telephone Encounter (Signed)
Patient requesting a call back to discuss lubiprostone ([name]) 24 MCG capsule  No other information was given  Please call patient

## 2021-06-16 MED ORDER — LUBIPROSTONE 24 MCG PO CAPS: 24.0000 ug | ORAL_CAPSULE | Freq: Two times a day (BID) | ORAL | 0 refills | Status: DC

## 2021-06-16 NOTE — Telephone Encounter (Signed)
She should be doing 0.5 mg weekly.  This is through PAP.  Daniel-can we increase her Ozempic to 1 mg weekly-do we need to do anything different for her PAP.

## 2021-06-22 NOTE — Telephone Encounter (Signed)
Spoke with patient, explained that she is due for renewal of novocares pap - will plan to increase ozempic to 1mg  weekly once approved, will continue current 0.5mg  weekly dose for now  Forms have been mailed to patient, once completed forms are received from patient will fax novocare app to manufacturer

## 2021-06-30 ENCOUNTER — Telehealth: Payer: HMO

## 2021-07-09 ENCOUNTER — Other Ambulatory Visit: Payer: Self-pay | Admitting: Internal Medicine

## 2021-07-28 ENCOUNTER — Other Ambulatory Visit: Payer: HMO

## 2021-07-28 ENCOUNTER — Other Ambulatory Visit: Payer: Self-pay

## 2021-07-28 ENCOUNTER — Other Ambulatory Visit: Payer: Self-pay | Admitting: Internal Medicine

## 2021-07-28 ENCOUNTER — Ambulatory Visit (INDEPENDENT_AMBULATORY_CARE_PROVIDER_SITE_OTHER): Payer: HMO

## 2021-07-28 ENCOUNTER — Telehealth: Payer: HMO

## 2021-07-28 DIAGNOSIS — R300 Dysuria: Secondary | ICD-10-CM

## 2021-07-28 DIAGNOSIS — I2510 Atherosclerotic heart disease of native coronary artery without angina pectoris: Secondary | ICD-10-CM

## 2021-07-28 DIAGNOSIS — I10 Essential (primary) hypertension: Secondary | ICD-10-CM

## 2021-07-28 DIAGNOSIS — E785 Hyperlipidemia, unspecified: Secondary | ICD-10-CM

## 2021-07-28 DIAGNOSIS — E1142 Type 2 diabetes mellitus with diabetic polyneuropathy: Secondary | ICD-10-CM

## 2021-07-28 MED ORDER — FLUCONAZOLE 150 MG PO TABS: 150.0000 mg | ORAL_TABLET | Freq: Once | ORAL | 0 refills | Status: DC

## 2021-07-28 MED ORDER — FLUCONAZOLE 150 MG PO TABS: 150.0000 mg | ORAL_TABLET | Freq: Once | ORAL | 0 refills | Status: AC

## 2021-07-28 NOTE — Patient Instructions (Addendum)
Visit Information  Following are the goals we discussed today:   Manage My Medicine   Timeframe:  Long-Range Goal Priority:  Medium Start Date:    07/28/2021                         Expected End Date:    07/28/2022                   Follow Up Date 10/25/2021   - call for medicine refill 2 or 3 days before it runs out - call if I am sick and can't take my medicine - keep a list of all the medicines I take; vitamins and herbals too - learn to read medicine labels - use a pillbox to sort medicine    Why is this important?   These steps will help you keep on track with your medicines.  Plan: Telephone follow up appointment with care management team member scheduled for:  3 months  The patient has been provided with contact information for the care management team and has been advised to call with any health related questions or concerns.   Tomasa Blase, PharmD Clinical Pharmacist, [facility]   Please call the care guide team at [phone] if you need to cancel or reschedule your appointment.   Print copy of patient instructions, educational materials, and care plan provided in person.

## 2021-07-28 NOTE — Progress Notes (Signed)
Chronic Care Management Pharmacy Note  07/28/2021 Name:  [name] MRN:  [id number] DOB:  21-Sep-1940  Summary: -Patient reports compliance to current medications  -BG has been better controlled averaging 100-120 - highest patient could recall was 237  -PAP applications for amitiza, ozempic, and jardiance filled out  -Patient notes to discharge / pain in groin region x 1 month - increase in urinary frequency  - (likely yeast infection / UTI - on jardiance)  Recommendations/Changes made from today's visit: -Recommending no changes to medications, patient to continue ozempic 0.49m weekly - as recent BG has been improved, would expect next A1c to be <7.0% - should next A1c be elevated from goal can increase ozempic to 170mweekly  -fluconazole rx'd by PCP for patient, urine test ordered for possible UTI - patient to make clinic aware should symptoms persist  -Will update patient on PAP applications when applications are processed  -Patient to make appointment with cardiology as it has been >1 year since she has seen, and PCP as she is overdue for visit   Plan: -F/u in 3 months   Subjective: [name] an [age].o. year old female who is a primary patient of Burns, StClaudina LickMD.  The CCM team was consulted for assistance with disease management and care coordination needs.    Engaged with patient face to face for follow up visit in response to provider referral for pharmacy case management and/or care coordination services.   Consent to Services:  The patient was given the following information about Chronic Care Management services today, agreed to services, and gave verbal consent: 1. CCM service includes personalized support from designated clinical staff supervised by the primary care provider, including individualized plan of care and coordination with other care providers 2. 24/7 contact phone numbers for assistance for urgent and routine care needs. 3. Service will  only be billed when office clinical staff spend 20 minutes or more in a month to coordinate care. 4. Only one practitioner may furnish and bill the service in a calendar month. 5.The patient may stop CCM services at any time (effective at the end of the month) by phone call to the office staff. 6. The patient will be responsible for cost sharing (co-pay) of up to 20% of the service fee (after annual deductible is met). Patient agreed to services and consent obtained.  Patient Care Team: BuBinnie RailMD as PCP - General (Internal Medicine) SmBelva CromeMD as PCP - Cardiology (Cardiology) BeSusa DayMD (Orthopedic Surgery) RoIzora GalaMD ReBlanch MediaMD (Neurology) McSatira SarkMD (Cardiology) SoChesley MiresMD (Pulmonary Disease) StErline LevineMD (Neurosurgery) LuDruscilla BrownieMD (Dermatology) GrKaty FitchRiDarlina GuysMD as Consulting Physician (Ophthalmology) SzTomasa BlaseRPWalnut Creek Endoscopy Center LLCPharmacist)  Recent office visits: 03/05/2021 - Dr. BuQuay Burow HSV - valtrex x 1 week, linzess 7219mdaily for constipation  Recent consult visits: None in previous 6 months   Hospital visits: None in previous 6 months  Objective:  Lab Results  Component Value Date   CREATININE 0.80 03/05/2021   BUN 15 03/05/2021   GFR 70.68 03/05/2021   GFRNONAA 76 01/23/2020   GFRAA 88 01/23/2020   NA 138 03/05/2021   K 4.0 03/05/2021   CALCIUM 10.2 03/05/2021   CO2 31 03/05/2021   GLUCOSE 99 03/05/2021    Lab Results  Component Value Date/Time   HGBA1C 7.0 (H) 03/05/2021 02:23 PM   HGBA1C 7.4 (H) 08/31/2020 12:26 PM  GFR 70.68 03/05/2021 02:23 PM   GFR 75.44 08/31/2020 12:26 PM   MICROALBUR 3.1 (H) 08/31/2020 12:26 PM   MICROALBUR 6.7 (H) 02/06/2019 12:20 PM    Last diabetic Eye exam:  Lab Results  Component Value Date/Time   HMDIABEYEEXA No Retinopathy 11/01/2018 12:00 AM    Last diabetic Foot exam:  No results found for: HMDIABFOOTEX   Lab Results  Component Value Date    CHOL 148 08/31/2020   HDL 68.00 08/31/2020   LDLCALC 61 08/31/2020   LDLDIRECT 113.0 07/21/2017   TRIG 94.0 08/31/2020   CHOLHDL 2 08/31/2020    Hepatic Function Latest Ref Rng & Units 03/05/2021 08/31/2020 08/12/2019  Total Protein 6.0 - 8.3 g/dL 7.1 7.5 7.4  Albumin 3.5 - 5.2 g/dL 4.3 4.3 4.3  AST 0 - 37 U/L 27 33 28  ALT 0 - 35 U/L 23 37(H) 27  Alk Phosphatase 39 - 117 U/L 68 73 62  Total Bilirubin 0.2 - 1.2 mg/dL 0.4 0.3 0.3  Bilirubin, Direct 0.0 - 0.3 mg/dL - - -    Lab Results  Component Value Date/Time   TSH 1.39 02/06/2019 12:20 PM   TSH 3.53 07/21/2017 04:43 PM    CBC Latest Ref Rng & Units 08/31/2020 01/06/2020 01/05/2020  WBC 4.0 - 10.5 K/uL 6.3 7.3 6.2  Hemoglobin 12.0 - 15.0 g/dL 14.9 13.9 14.3  Hematocrit 36.0 - 46.0 % 44.7 41.8 44.1  Platelets 150.0 - 400.0 K/uL 215.0 234 248    No results found for: VD25OH  Clinical ASCVD: No  The 10-year ASCVD risk score (Arnett DK, et al., 2019) is: 28.8%   Values used to calculate the score:     Age: 81 years     Sex: Female     Is Non-Hispanic African American: Yes     Diabetic: Yes     Tobacco smoker: No     Systolic Blood Pressure: 540 mmHg     Is BP treated: Yes     HDL Cholesterol: 68 mg/dL     Total Cholesterol: 148 mg/dL    Depression screen [facility] 2/9 04/28/2021 02/06/2020 08/13/2019  Decreased Interest 0 0 0  Down, Depressed, Hopeless 0 0 0  PHQ - 2 Score 0 0 0  Altered sleeping - - -  Tired, decreased energy - - -  Change in appetite - - -  Feeling bad or failure about yourself  - - -  Trouble concentrating - - -  Moving slowly or fidgety/restless - - -  Suicidal thoughts - - -  PHQ-9 Score - - -  Difficult doing work/chores - - -  Some recent data might be hidden    Social History   Tobacco Use  Smoking Status Former   Types: Cigarettes   Quit date: 10/19/1985   Years since quitting: 35.7  Smokeless Tobacco Never   BP Readings from Last 3 Encounters:  03/05/21 114/80  08/31/20 116/76   04/06/20 120/72   Pulse Readings from Last 3 Encounters:  03/05/21 62  08/31/20 80  04/06/20 75   Wt Readings from Last 3 Encounters:  03/05/21 168 lb (76.2 kg)  08/31/20 163 lb (73.9 kg)  04/06/20 163 lb (73.9 kg)   BMI Readings from Last 3 Encounters:  03/05/21 30.73 kg/m  08/31/20 29.81 kg/m  04/06/20 29.81 kg/m    Assessment/Interventions: Review of patient past medical history, allergies, medications, health status, including review of consultants reports, laboratory and other test data, was performed as part of comprehensive evaluation  and provision of chronic care management services.   SDOH:  (Social Determinants of Health) assessments and interventions performed: Yes  SDOH Screenings   Alcohol Screen: Low Risk    Last Alcohol Screening Score (AUDIT): 1  Depression (PHQ2-9): Low Risk    PHQ-2 Score: 0  Financial Resource Strain: Low Risk    Difficulty of Paying Living Expenses: Not hard at all  Food Insecurity: No Food Insecurity   Worried About Charity fundraiser in the Last Year: Never true   Ran Out of Food in the Last Year: Never true  Housing: Low Risk    Last Housing Risk Score: 0  Physical Activity: Sufficiently Active   Days of Exercise per Week: 5 days   Minutes of Exercise per Session: 30 min  Social Connections: Engineer, building services of Communication with Friends and Family: More than three times a week   Frequency of Social Gatherings with Friends and Family: More than three times a week   Attends Religious Services: More than 4 times per year   Active Member of [employer] or Organizations: Yes   Attends Music therapist: More than 4 times per year   Marital Status: Married  Stress: No Stress Concern Present   Feeling of Stress : Not at all  Tobacco Use: Medium Risk   Smoking Tobacco Use: Former   Smokeless Tobacco Use: Never   Passive Exposure: Not on Pensions consultant Needs: No Transportation Needs   Lack of  Transportation (Medical): No   Lack of Transportation (Non-Medical): No    CCM Care Plan  Allergies  Allergen Reactions   Cymbalta [Duloxetine Hcl] Hives and Itching   Effexor [Venlafaxine] Nausea Only   Linzess [Linaclotide]     diarrhea   Other Other (See Comments)    "SEEDED" food due to stomach issues   Statins Nausea And Vomiting   Penicillins Hives and Rash    Has patient had a PCN reaction causing immediate rash, facial/tongue/throat swelling, SOB or lightheadedness with hypotension: Yes Has patient had a PCN reaction causing severe rash involving mucus membranes or skin necrosis: No Has patient had a PCN reaction that required hospitalization: No Has patient had a PCN reaction occurring within the last 10 years: Yes If all of the above answers are "NO", then may proceed with Cephalosporin use.     Medications Reviewed Today     Reviewed by Tomasa Blase, [facility] (Pharmacist) on 07/28/21 at 1020  Med List Status: <None>   Medication Order Taking? Sig Documenting Provider Last Dose Status Informant  acetaminophen (TYLENOL) 500 MG tablet [id number] Yes Take 500 mg by mouth every 6 (six) hours as needed for headache (pain). [provider] Taking Active Multiple Informants  albuterol (VENTOLIN HFA) 108 (90 Base) MCG/ACT inhaler [id number] Yes Inhale 2 puffs into the lungs every 6 (six) hours as needed for wheezing or shortness of breath. [provider] Taking Active Multiple Informants  aspirin EC 81 MG tablet [id number] Yes Take 1 tablet (81 mg total) by mouth daily. Belva Crome, MD Taking Active Multiple Informants    Discontinued 09/26/14 2148 (Inpatient Standard) clonazePAM (KLONOPIN) 0.5 MG tablet [id number] Yes TAKE 1/2 TO 1 TABLET TWICE A DAY AS NEEDED FOR ANXIETY Burns, Claudina Lick, MD Taking Active   Continuous Blood Gluc Receiver (FREESTYLE LIBRE READER) DEVI [id number] Yes UAD to check sugars E11.9 Binnie Rail, MD Taking Active   Continuous Blood Gluc  Sensor ([place]) [place] [id number]  Yes UAD to check sugars E11.9 Binnie Rail, MD Taking Active   empagliflozin (JARDIANCE) 25 MG TABS tablet [id number] Yes Take 1 tablet (25 mg total) by mouth daily before breakfast. Via [employer] pt assistance Binnie Rail, MD Taking Active   ezetimibe (ZETIA) 10 MG tablet [id number] Yes TAKE ONE TABLET BY MOUTH ONCE DAILY Burns, Claudina Lick, MD Taking Active   fluticasone (FLONASE) 50 MCG/ACT nasal spray [id number] Yes Place 2 sprays into both nostrils in the morning and at bedtime. [provider] Taking Active   glucose blood (ONETOUCH ULTRA) test strip [id number] Yes USE UP TO 4 TIMES DAILY AS DIRECTED Binnie Rail, MD Taking Active Multiple Informants  Lancets MISC [id number] Yes Use to check blood sugars up to 4 times daily as directed. E11.9 Binnie Rail, MD Taking Active Multiple Informants  lubiprostone ([name]) 24 MCG capsule [id number] Yes Take 1 capsule (24 mcg total) by mouth 2 (two) times daily with a meal. Burns, Claudina Lick, MD Taking Active   meclizine (ANTIVERT) 12.5 MG tablet [id number] Yes TAKE 1 TABLET BY MOUTH 3 TIMES A DAY AS NEEDED FOR DIZZINESS Burns, Claudina Lick, MD Taking Active   Multiple Vitamin (MULTIVITAMIN WITH MINERALS) TABS tablet [id number] Yes Take 1 tablet by mouth daily after lunch. [provider] Taking Active Multiple Informants  nitroGLYCERIN (NITROSTAT) 0.4 MG SL tablet [id number] Yes Place 1 tablet (0.4 mg total) under the tongue every 5 (five) minutes as needed for chest pain. Binnie Rail, MD Taking Active   omeprazole (PRILOSEC) 20 MG capsule [id number] Yes TAKE ONE CAPSULE BY MOUTH EVERY MORNING Binnie Rail, MD Taking Active   OVER THE [place] [id number] Yes Place 1 drop into both eyes 2 (two) times daily as needed (dry eyes). Over the counter eye drop for itching [provider] Taking Active Multiple Informants  OXcarbazepine (TRILEPTAL) 150 MG tablet [id number] Yes TAKE ONE  TABLET BY MOUTH TWICE DAILY Burns, Claudina Lick, MD Taking Active   OZEMPIC, 0.25 OR 0.5 MG/DOSE, 2 MG/1.5ML SOPN [id number] Yes Inject 0.375 mLs (0.5 mg total) into the skin once a week. Binnie Rail, MD Taking Active   Polyvinyl Alcohol-Povidone [facility] OP) [id number] Yes Place 1 drop into both eyes daily as needed (dry eyes). [provider] Taking Active Multiple Informants  potassium chloride SA (KLOR-CON) 20 MEQ tablet [id number] Yes Take 1 tablet (20 mEq total) by mouth 2 (two) times daily. Binnie Rail, MD Taking Active   pregabalin (LYRICA) 75 MG capsule [id number] Yes TAKE ONE CAPSULE BY MOUTH EVERY MORNING, NOON AND night Binnie Rail, MD Taking Active   Probiotic Product (PROBIOTIC PO) [id number] Yes Take 1 capsule by mouth daily after breakfast. [provider] Taking Active Multiple Informants           Med Note Iva Lento, COLLEEN E   Fri Feb 01, 2019  4:07 PM)    rosuvastatin (CRESTOR) 5 MG tablet [id number] Yes TAKE ONE TABLET BY MOUTH EVERYDAY AT BEDTIME Binnie Rail, MD Taking Active             Patient Active Problem List   Diagnosis Date Noted   Herpes simplex infection 03/05/2021   Mild neurocognitive disorder due to multiple etiologies 11/06/2020   Change in stool 01/23/2020   Hypokalemia 01/06/2020   Unstable angina 01/05/2020   Pain in joint of right shoulder 01/21/2019   CAD (coronary artery disease) 05/28/2018   Spondylolisthesis 09/20/2017   Degeneration of lumbar intervertebral disc  08/02/2017   Abdominal distension, gaseous 04/10/2017   Dysuria 04/10/2017   Hearing loss 02/01/2017   Osteoarthritis of left knee 01/12/2017   Constipation 01/10/2017   Hypercalcemia 10/12/2016   Chest pain 10/12/2016   Dyspnea on exertion 10/12/2016   Neck pain 09/27/2016   Laryngopharyngeal reflux (LPR) 09/27/2016   Presbycusis of both ears 09/27/2016   Lumbar pain 08/23/2016   Hair loss 08/23/2016   Situational anxiety    Myofascial pain 05/19/2015    Diverticulitis of large intestine 05/12/2014   Diabetic neuropathy 05/12/2014   Seizure disorder ([place]) 05/12/2014   Steatosis of liver 05/12/2014   Type 2 diabetes mellitus 05/12/2014   Cerebrovascular disease 03/13/2013   Allergic rhinitis 03/13/2013   Non-toxic multinodular goiter 05/18/2009   Vertigo 04/13/2009   Irritable bowel syndrome 03/20/2009   Insomnia 03/20/2009   Dyslipidemia 03/18/2009   Obstructive sleep apnea 03/18/2009   Gastroesophageal reflux disease 03/18/2009    Immunization History  Administered Date(s) Administered   Fluad Quad(high Dose 65+) 03/20/2019   Influenza Split 03/28/2011, 02/26/2012, 03/20/2013   Influenza Whole 02/25/2009, 04/01/2010   Influenza, High Dose Seasonal PF 03/03/2017, 05/28/2018, 03/12/2020   Influenza-Unspecified 09/05/2014, 03/27/2016, 02/25/2017, 03/05/2021   PFIZER Comirnaty(Gray Top)Covid-19 Tri-Sucrose Vaccine 11/15/2020   PFIZER(Purple Top)SARS-COV-2 Vaccination 07/16/2019, 08/06/2019, 03/23/2020   Pneumococcal Conjugate-13 09/14/2015   Pneumococcal Polysaccharide-23 06/28/2007, 10/11/2017   Tetanus 05/02/2012   Zoster Recombinat (Shingrix) 01/27/2020, 05/19/2020    Conditions to be addressed/monitored:  Hypertension, Hyperlipidemia, Diabetes, and Coronary Artery Disease  Care Plan : CCM Pharmacy Care Plan  Updates made by Tomasa Blase, RPH since 07/28/2021 12:00 AM     Problem: HTN, HLD, CAD, DM   Priority: High  Onset Date: 07/28/2021     Long-Range Goal: Disease Management   Start Date: 07/28/2021  Expected End Date: 07/28/2022  This Visit's Progress: On track  Priority: High  Note:   Current Barriers:  Chronic Disease Management support, education, and care coordination needs related to Hypertension, Hyperlipidemia, Diabetes, and Coronary Artery Disease   Hypertension Controlled- most recent office visit measurements within goal BP Readings from Last 3 Encounters:  03/05/21 114/80  08/31/20 116/76  04/06/20  120/72  Pharmacist Clinical Goal(s): Patient will work with PharmD and providers to maintain BP goal <130/80 Current regimen:  No medications Interventions: Discussed BP goals and benefits of medications for prevention of heart attack / stroke Patient self care activities - Patient will: Check BP as needed, document, and provide at future appointments Ensure daily salt intake < 2300 mg/day  Hyperlipidemia / Coronary Artery Disease Controlled  Lab Results  Component Value Date   [place] 61 08/31/2020  Pharmacist Clinical Goal(s): Patient will work with PharmD and providers to maintain LDL goal < 70 Current regimen:  rosuvastatin 5 mg daily ezetimibe 10 mg daily aspirin 81 mg daily nitroglycerin 0.4 mg as needed  Interventions: Discussed cholesterol goals and benefits of medications for prevention of heart attack / stroke Patient self care activities -Patient will: Continue current medications  Diabetes Controlled  -Checking BG at least BID - averaging 100-120 highest she could recall was 130 - x 1 instance of hypoglycemia in the 70's - due to skipping / small meal at lunch  Lab Results  Component Value Date   HGBA1C 7.0 (H) 03/05/2021  Pharmacist Clinical Goal(s): Patient will work with PharmD and providers to maintain A1c goal <7% Current regimen:  Jardiance 25 mg daily (via [employer] - delivered to patient) Ozempic 0.5 mg weekly (via [employer] -  delivered to office) Interventions: Patient reports discharge / pain in groin area x 1 month, notes to increase in urination as well - fluconazole rx'd by PCP - urine test ordered for patient  Recommending to continue current medications as BG has improved, would expect next A1c to be <%7.0 Patient self care activities - Patient will: Check blood sugar twice daily, document, and provide at future appointments Contact provider with any episodes of hypoglycemia Contact [employer] [facility] for refills of Jardiance.  Contact office for refills  of Ozempic via [employer]  Medication management Pharmacist Clinical Goal(s): Patient will work with PharmD and providers to achieve optimal medication adherence Current pharmacy: [facility] Inter[facility] Comprehensive medication review performed. Utilize [facility] pharmacy for medication synchronization, packaging and delivery Renew PAP for Anthonette Legato, [name] for 2023 Patient self care activities - Patient will: Focus on medication adherence by pill packs Take medications as prescribed Report any questions or concerns to PharmD and/or provider(s)       Medication Assistance:  PAP applications for amitiza, ozempic, and jardiance completed today   Compliance/Adherence/Medication fill history: Care Gaps: Hep C screening Ophthalmology exam COVID boost   Patient's preferred pharmacy is:  [facility] - [place], [place] - [address] [place] [address] Phone: [phone] Fax: [phone]  [id number] [place] [id number]: [phone]: [phone]  Uses pill box? Yes Pt endorses 100% compliance  Care Plan and Follow Up Patient Decision:  Patient agrees to Care Plan and Follow-up.  Plan: Telephone follow up appointment with care management team member scheduled for:  3 months  The patient has been provided with contact information for the care management team and has been advised to call with any health related questions or concerns.   DTomasa Blase PharmD Clinical Pharmacist, [place]

## 2021-07-29 ENCOUNTER — Other Ambulatory Visit: Payer: Self-pay | Admitting: Internal Medicine

## 2021-07-29 DIAGNOSIS — H9201 Otalgia, right ear: Secondary | ICD-10-CM | POA: Diagnosis not present

## 2021-07-29 DIAGNOSIS — J343 Hypertrophy of nasal turbinates: Secondary | ICD-10-CM | POA: Diagnosis not present

## 2021-07-29 DIAGNOSIS — J310 Chronic rhinitis: Secondary | ICD-10-CM | POA: Diagnosis not present

## 2021-07-30 ENCOUNTER — Telehealth: Payer: Self-pay

## 2021-07-30 LAB — URINE CULTURE: Result:: NO GROWTH

## 2021-07-30 NOTE — Telephone Encounter (Signed)
Pt called back for results. I advise the pt that Dr. Quay Burow said her Urine Culture showed no infection.  Pt understood and had no questions  FYI

## 2021-08-05 ENCOUNTER — Other Ambulatory Visit: Payer: Self-pay | Admitting: Internal Medicine

## 2021-08-05 DIAGNOSIS — I2510 Atherosclerotic heart disease of native coronary artery without angina pectoris: Secondary | ICD-10-CM

## 2021-08-06 ENCOUNTER — Telehealth: Payer: Self-pay

## 2021-08-06 NOTE — Progress Notes (Signed)
° ° °  Chronic Care Management Pharmacy Assistant   Name: [name]  MRN: [id number] DOB: Apr 20, 1941  Contacted [facility]  to follow up on patient assistance application for Jardiance. Per representative at [facility] cares states  that the patient checked that she has medicare and private insurance. Because she has private insurance patient will have to fax in [place] from the pharmacy for 2022. Called patient to ask if she can get copays from the pharmacy and bring to the office to be faxed to [facility]. Left message with patient to return call.     [place] Pharmacist Assistant [phone]

## 2021-08-09 ENCOUNTER — Emergency Department (HOSPITAL_BASED_OUTPATIENT_CLINIC_OR_DEPARTMENT_OTHER)
Admission: EM | Admit: 2021-08-09 | Discharge: 2021-08-09 | Disposition: A | Discharge status: Home / Self Care | Payer: HMO | Attending: Emergency Medicine | Admitting: Emergency Medicine

## 2021-08-09 ENCOUNTER — Encounter (HOSPITAL_BASED_OUTPATIENT_CLINIC_OR_DEPARTMENT_OTHER): Payer: Self-pay

## 2021-08-09 ENCOUNTER — Emergency Department (HOSPITAL_BASED_OUTPATIENT_CLINIC_OR_DEPARTMENT_OTHER): Payer: HMO

## 2021-08-09 ENCOUNTER — Other Ambulatory Visit: Payer: Self-pay

## 2021-08-09 DIAGNOSIS — R52 Pain, unspecified: Secondary | ICD-10-CM

## 2021-08-09 DIAGNOSIS — R110 Nausea: Secondary | ICD-10-CM | POA: Insufficient documentation

## 2021-08-09 DIAGNOSIS — E119 Type 2 diabetes mellitus without complications: Secondary | ICD-10-CM | POA: Diagnosis not present

## 2021-08-09 DIAGNOSIS — I10 Essential (primary) hypertension: Secondary | ICD-10-CM | POA: Diagnosis not present

## 2021-08-09 DIAGNOSIS — M25552 Pain in left hip: Secondary | ICD-10-CM | POA: Insufficient documentation

## 2021-08-09 DIAGNOSIS — R109 Unspecified abdominal pain: Secondary | ICD-10-CM | POA: Diagnosis not present

## 2021-08-09 DIAGNOSIS — R0600 Dyspnea, unspecified: Secondary | ICD-10-CM | POA: Insufficient documentation

## 2021-08-09 DIAGNOSIS — Z7982 Long term (current) use of aspirin: Secondary | ICD-10-CM | POA: Insufficient documentation

## 2021-08-09 DIAGNOSIS — Z79899 Other long term (current) drug therapy: Secondary | ICD-10-CM | POA: Insufficient documentation

## 2021-08-09 DIAGNOSIS — R079 Chest pain, unspecified: Secondary | ICD-10-CM | POA: Diagnosis not present

## 2021-08-09 DIAGNOSIS — R1032 Left lower quadrant pain: Secondary | ICD-10-CM | POA: Diagnosis not present

## 2021-08-09 DIAGNOSIS — Z20822 Contact with and (suspected) exposure to covid-19: Secondary | ICD-10-CM | POA: Diagnosis not present

## 2021-08-09 DIAGNOSIS — R0602 Shortness of breath: Secondary | ICD-10-CM | POA: Diagnosis not present

## 2021-08-09 LAB — CBC
HCT: 43.9 % (ref 36.0–46.0)
Hemoglobin: 14.5 g/dL (ref 12.0–15.0)
MCH: 30.7 pg (ref 26.0–34.0)
MCHC: 33 g/dL (ref 30.0–36.0)
MCV: 93 fL (ref 80.0–100.0)
Platelets: 225 10*3/uL (ref 150–400)
RBC: 4.72 MIL/uL (ref 3.87–5.11)
RDW: 13.2 % (ref 11.5–15.5)
WBC: 7.2 10*3/uL (ref 4.0–10.5)
nRBC: 0 % (ref 0.0–0.2)

## 2021-08-09 LAB — TROPONIN I (HIGH SENSITIVITY)
Troponin I (High Sensitivity): 3 ng/L (ref <18)
Troponin I (High Sensitivity): 3 ng/L (ref <18)

## 2021-08-09 LAB — COMPREHENSIVE METABOLIC PANEL
ALT: 25 U/L (ref 0–44)
AST: 30 U/L (ref 15–41)
Albumin: 4.1 g/dL (ref 3.5–5.0)
Alkaline Phosphatase: 59 U/L (ref 38–126)
Anion gap: 11 (ref 5–15)
BUN: 14 mg/dL (ref 8–23)
CO2: 26 mmol/L (ref 22–32)
Calcium: 10 mg/dL (ref 8.9–10.3)
Chloride: 101 mmol/L (ref 98–111)
Creatinine, Ser: 0.64 mg/dL (ref 0.44–1.00)
GFR, Estimated: >60 mL/min (ref >60)
Glucose, Bld: 83 mg/dL (ref 70–99)
Potassium: 4.3 mmol/L (ref 3.5–5.1)
Sodium: 138 mmol/L (ref 135–145)
Total Bilirubin: 0.4 mg/dL (ref 0.3–1.2)
Total Protein: 7.4 g/dL (ref 6.5–8.1)

## 2021-08-09 LAB — URINALYSIS, ROUTINE W REFLEX MICROSCOPIC
Bilirubin Urine: NEGATIVE
Glucose, UA: >=500 mg/dL — AB
Hgb urine dipstick: NEGATIVE
Ketones, ur: NEGATIVE mg/dL
Leukocytes,Ua: NEGATIVE
Nitrite: NEGATIVE
Protein, ur: NEGATIVE mg/dL
Specific Gravity, Urine: 1.02 (ref 1.005–1.030)
pH: 5.5 (ref 5.0–8.0)

## 2021-08-09 LAB — LIPASE, BLOOD: Lipase: 91 U/L — ABNORMAL HIGH (ref 11–51)

## 2021-08-09 LAB — URINALYSIS, MICROSCOPIC (REFLEX)

## 2021-08-09 LAB — RESP PANEL BY RT-PCR (FLU A&B, COVID) ARPGX2
Influenza A by PCR: NEGATIVE
Influenza B by PCR: NEGATIVE
SARS Coronavirus 2 by RT PCR: NEGATIVE

## 2021-08-09 MED ORDER — METHOCARBAMOL 500 MG PO TABS: 1000.0000 mg | ORAL_TABLET | Freq: Every day | ORAL | 0 refills | Status: AC

## 2021-08-09 MED ORDER — SODIUM CHLORIDE 0.9 % IV BOLUS
1000.0000 mL | Freq: Once | INTRAVENOUS | Status: AC
  Administered 2021-08-09: 1000 mL via INTRAVENOUS

## 2021-08-09 MED ORDER — ACETAMINOPHEN 325 MG PO TABS
650.0000 mg | ORAL_TABLET | Freq: Once | ORAL | Status: AC
  Administered 2021-08-09: 650 mg via ORAL
  Filled 2021-08-09: qty 2

## 2021-08-09 MED ORDER — KETOROLAC TROMETHAMINE 15 MG/ML IJ SOLN
15.0000 mg | Freq: Once | INTRAMUSCULAR | Status: AC
  Administered 2021-08-09: 15 mg via INTRAVENOUS
  Filled 2021-08-09: qty 1

## 2021-08-09 MED ORDER — FAMOTIDINE IN NACL 20-0.9 MG/50ML-% IV SOLN
20.0000 mg | Freq: Once | INTRAVENOUS | Status: AC
  Administered 2021-08-09: 20 mg via INTRAVENOUS
  Filled 2021-08-09: qty 50

## 2021-08-09 MED ORDER — ACETAMINOPHEN 325 MG PO TABS: 650.0000 mg | ORAL_TABLET | Freq: Four times a day (QID) | ORAL | 0 refills | Status: DC | PRN

## 2021-08-09 MED ORDER — LIDOCAINE 5 % EX PTCH: 1.0000 | MEDICATED_PATCH | Freq: Every day | CUTANEOUS | 0 refills | Status: DC | PRN

## 2021-08-09 MED ORDER — IBUPROFEN 600 MG PO TABS: 600.0000 mg | ORAL_TABLET | Freq: Four times a day (QID) | ORAL | 0 refills | Status: DC | PRN

## 2021-08-09 MED ORDER — ONDANSETRON HCL 4 MG/2ML IJ SOLN
4.0000 mg | Freq: Once | INTRAMUSCULAR | Status: AC
  Administered 2021-08-09: 4 mg via INTRAVENOUS
  Filled 2021-08-09: qty 2

## 2021-08-09 MED ORDER — IOHEXOL 300 MG/ML  SOLN
100.0000 mL | Freq: Once | INTRAMUSCULAR | Status: AC | PRN
  Administered 2021-08-09: 100 mL via INTRAVENOUS

## 2021-08-09 MED ORDER — LIDOCAINE 5 % EX PTCH
1.0000 | MEDICATED_PATCH | CUTANEOUS | Status: DC
  Administered 2021-08-09: 1 via TRANSDERMAL
  Filled 2021-08-09: qty 1

## 2021-08-09 NOTE — Discharge Instructions (Addendum)
It was a pleasure caring for you today in the emergency department.  Please alternate Tylenol and Motrin as prescribed every 4-6 hours for the next 2 days.  Take with food.   Please return to the emergency department for any worsening or worrisome symptoms.

## 2021-08-09 NOTE — ED Triage Notes (Signed)
Pt c/o left flank, left abd and left hip/groin pain x 4 days-c/o SOB started this am-NAD-slow steady gait

## 2021-08-09 NOTE — ED Notes (Signed)
Patient ambulated in room.  Steady gait noted.  Reports pain is improving.

## 2021-08-09 NOTE — ED Provider Notes (Signed)
[facility] EMERGENCY DEPARTMENT Provider Note   CSN: [id number] Arrival date & time: 08/09/21  1058     History  Chief Complaint  Patient presents with   Flank Pain    [name] is a 81 y.o. female.  This is a 81 y.o. female  with significant medical history as below, including dyslipidemia, HTN, IBS, reticulosis who presents to the ED with complaint of a myriad of complaints including left-sided flank pain, left hip pain, mild dyspnea, arthralgias.  Nausea without emesis.  Location: Left hip, left flank Duration: 3 days Onset: Gradual Timing: Constant Description: Aching, sharp, stabbing Severity: Mild Exacerbating/Alleviating Factors: Improved with Motrin Associated Symptoms: See above Pertinent Negatives: No fevers, chills, vomiting, no black stool or bright red per rectum.  No dysuria or hematuria.  No suspicious oral intake, no recent sick contacts or travel.  No trauma.  No numbness or tingling.    Past Medical History: 04/10/2017: Abdominal distension, gaseous 03/13/2013: Allergic rhinitis 05/28/2018: CAD (coronary artery disease) No date: Cerebrovascular disease 10/12/2016: Chest pain 08/02/2017: Degeneration of lumbar intervertebral disc 05/12/2014: Diabetic neuropathy     Comment:  Did not tolerated cymbalta - hives, itching Tried               nortriptyline - not effective Did not tolerate               Effexor-nausea gabapentin-not effective  05/12/2014: Diverticulitis of large intestine No date: Dyslipidemia 03/18/2009: Gastroesophageal reflux disease 02/01/2017: Hearing loss 10/12/2016: Hypercalcemia No date: Hypertension 01/06/2020: Hypokalemia 03/20/2009: Insomnia 03/20/2009: Irritable bowel syndrome     Comment:  With chronic constipation 09/27/2016: Laryngopharyngeal reflux (LPR) 08/23/2016: Lumbar pain 11/06/2020: Mild neurocognitive disorder due to multiple etiologies 05/19/2015: Myofascial pain 09/27/2016: Neck pain 05/18/2009:  Non-toxic multinodular goiter 03/18/2009: Obstructive sleep apnea     Comment:  Not using CPAP 01/12/2017: Osteoarthritis of left knee No date: Osteoarthritis of shoulder region 01/21/2019: Pain in joint of right shoulder 09/27/2016: Presbycusis of both ears No date: Seizure disorder No date: Situational anxiety 09/20/2017: Spondylolisthesis 05/12/2014: Steatosis of liver 05/12/2014: Type 2 diabetes mellitus 01/05/2020: Unstable angina 04/13/2009: Vertigo  Past Surgical History: 1970's: ABDOMINAL HYSTERECTOMY     Comment:  Partial No date: APPENDECTOMY No date: CHOLECYSTECTOMY 05/28/2018: LEFT HEART CATH AND CORONARY ANGIOGRAPHY; N/A     Comment:  Procedure: LEFT HEART CATH AND CORONARY ANGIOGRAPHY;                Surgeon: Belva Crome, MD;  Location: [facility]               LAB;  Service: Cardiovascular;  Laterality: N/A; 01/06/2020: LEFT HEART CATH AND CORONARY ANGIOGRAPHY; N/A     Comment:  Procedure: LEFT HEART CATH AND CORONARY ANGIOGRAPHY;                Surgeon: Belva Crome, MD;  Location: [facility]               LAB;  Service: Cardiovascular;  Laterality: N/A; 08/25/2017: LUMBAR EPIDURAL INJECTION; Left 2008: SHOULDER SURGERY     Comment:  LT, post fall  No date: TONSILLECTOMY AND ADENOIDECTOMY 01/12/2017: TOTAL KNEE ARTHROPLASTY; Left     Comment:  Procedure: LEFT TOTAL KNEE ARTHROPLASTY;  Surgeon:               Susa Day, MD;  Location: [facility];  Service:               Orthopedics;  Laterality: Left;  120 mins    The history is provided by the patient. No language interpreter was used.  Flank Pain Associated symptoms include shortness of breath. Pertinent negatives include no chest pain, no abdominal pain and no headaches.      Home Medications Prior to Admission medications   Medication Sig Start Date End Date Taking? Authorizing Provider  acetaminophen (TYLENOL) 325 MG tablet Take 2 tablets (650 mg total) by mouth every 6 (six) hours as needed  (alternate with ibuprofen). 08/09/21  Yes Wynona Dove A, DO  ibuprofen (ADVIL) 600 MG tablet Take 1 tablet (600 mg total) by mouth every 6 (six) hours as needed (alternate with acetaminophen). 08/09/21  Yes Wynona Dove A, DO  lidocaine (LIDODERM) 5 % Place 1 patch onto the skin daily as needed. Remove & Discard patch within 12 hours or as directed by MD 08/09/21  Yes Jeanell Sparrow, DO  methocarbamol (ROBAXIN) 500 MG tablet Take 2 tablets (1,000 mg total) by mouth at bedtime for 5 days. 08/09/21 08/14/21 Yes Jeanell Sparrow, DO  acetaminophen (TYLENOL) 500 MG tablet Take 500 mg by mouth every 6 (six) hours as needed for headache (pain).    [provider]  albuterol (VENTOLIN HFA) 108 (90 Base) MCG/ACT inhaler Inhale 2 puffs into the lungs every 6 (six) hours as needed for wheezing or shortness of breath.    [provider]  aspirin EC 81 MG tablet Take 1 tablet (81 mg total) by mouth daily. 04/17/18   Belva Crome, MD  clonazePAM (KLONOPIN) 0.5 MG tablet TAKE 1/2 TO 1 TABLET TWICE A DAY AS NEEDED FOR ANXIETY 11/19/20   Binnie Rail, MD  Continuous Blood Gluc Receiver (FREESTYLE LIBRE READER) DEVI UAD to check sugars E11.9 03/05/21   Binnie Rail, MD  Continuous Blood Gluc Sensor ([place]) MISC UAD to check sugars E11.9 03/05/21   Binnie Rail, MD  empagliflozin (JARDIANCE) 25 MG TABS tablet Take 1 tablet (25 mg total) by mouth daily before breakfast. Via [facility] pt assistance 04/08/21   Binnie Rail, MD  ezetimibe (ZETIA) 10 MG tablet TAKE ONE TABLET BY MOUTH ONCE DAILY 08/06/21   Binnie Rail, MD  fluticasone (FLONASE) 50 MCG/ACT nasal spray Place 2 sprays into both nostrils in the morning and at bedtime.    [provider]  glucose blood (ONETOUCH ULTRA) test strip USE UP TO 4 TIMES DAILY AS DIRECTED 10/15/19   Binnie Rail, MD  Lancets MISC Use to check blood sugars up to 4 times daily as directed. E11.9 10/15/19   Binnie Rail, MD   lubiprostone ([name]) 24 MCG capsule Take 1 capsule (24 mcg total) by mouth 2 (two) times daily with a meal. 06/16/21   Burns, Claudina Lick, MD  meclizine (ANTIVERT) 12.5 MG tablet TAKE 1 TABLET BY MOUTH 3 TIMES A DAY AS NEEDED FOR DIZZINESS 05/19/21   Binnie Rail, MD  Multiple Vitamin (MULTIVITAMIN WITH MINERALS) TABS tablet Take 1 tablet by mouth daily after lunch.    [provider]  nitroGLYCERIN (NITROSTAT) 0.4 MG SL tablet Place 1 tablet (0.4 mg total) under the tongue every 5 (five) minutes as needed for chest pain. 03/05/21   Binnie Rail, MD  omeprazole (PRILOSEC) 20 MG capsule TAKE ONE CAPSULE BY MOUTH EVERY MORNING 03/02/21   Burns, Claudina Lick, MD  OVER THE COUNTER MEDICATION Place 1 drop into both eyes 2 (two) times daily as needed (dry eyes).  Over the counter eye drop for itching    [provider]  OXcarbazepine (TRILEPTAL) 150 MG tablet TAKE ONE TABLET BY MOUTH TWICE DAILY 05/06/21   Burns, Claudina Lick, MD  OZEMPIC, 0.25 OR 0.5 MG/DOSE, 2 MG/1.5ML SOPN Inject 0.375 mLs (0.5 mg total) into the skin once a week. 07/09/21   Binnie Rail, MD  Polyvinyl Alcohol-Povidone (REFRESH OP) Place 1 drop into both eyes daily as needed (dry eyes).    [provider]  potassium chloride SA (KLOR-CON M) 20 MEQ tablet TAKE ONE TABLET BY MOUTH THREE TIMES DAILY 07/29/21   Binnie Rail, MD  pregabalin (LYRICA) 75 MG capsule TAKE ONE CAPSULE BY MOUTH EVERY MORNING, NOON AND night 06/08/21   Binnie Rail, MD  Probiotic Product (PROBIOTIC PO) Take 1 capsule by mouth daily after breakfast.    [provider]  rosuvastatin (CRESTOR) 5 MG tablet TAKE ONE TABLET BY MOUTH EVERYDAY AT BEDTIME 08/17/20   Binnie Rail, MD  Calcium Carbonate (CALCIUM 500 PO) Take 1 capsule by mouth every other day.   09/26/14  [provider]      Allergies    Cymbalta [duloxetine hcl], Effexor [venlafaxine], Linzess [linaclotide], Other, Statins, and Penicillins    Review of Systems    Review of Systems  Constitutional:  Positive for appetite change. Negative for activity change and fever.  HENT:  Negative for facial swelling and trouble swallowing.   Eyes:  Negative for discharge and redness.  Respiratory:  Positive for shortness of breath. Negative for cough.   Cardiovascular:  Negative for chest pain and palpitations.  Gastrointestinal:  Positive for nausea. Negative for abdominal pain.  Genitourinary:  Positive for flank pain. Negative for dysuria.  Musculoskeletal:  Positive for arthralgias. Negative for back pain and gait problem.       Hip pain left  Skin:  Negative for pallor and rash.  Neurological:  Negative for syncope and headaches.   Physical Exam Updated Vital Signs BP (!) 154/69 (BP Location: Left Arm)    Pulse (!) 52    Temp 98.3 F (36.8 C) (Oral)    Resp 18    Ht '5\' 2"'  (1.575 m)    Wt 76.2 kg    SpO2 100%    BMI 30.73 kg/m  Physical Exam Vitals and nursing note reviewed.  Constitutional:      General: She is not in acute distress.    Appearance: Normal appearance.  HENT:     Head: Normocephalic and atraumatic.     Right Ear: External ear normal.     Left Ear: External ear normal.     Nose: Nose normal.     Mouth/Throat:     Mouth: Mucous membranes are moist.  Eyes:     General: No scleral icterus.       Right eye: No discharge.        Left eye: No discharge.  Cardiovascular:     Rate and Rhythm: Regular rhythm. Bradycardia present.     Pulses: Normal pulses.          Radial pulses are 2+ on the right side and 2+ on the left side.       Dorsalis pedis pulses are 2+ on the right side and 2+ on the left side.     Heart sounds: Normal heart sounds.     Comments: Sinus brady Pulmonary:     Effort: Pulmonary effort is normal. No respiratory distress.     Breath sounds:  Normal breath sounds.  Abdominal:     General: Abdomen is flat.     Tenderness: There is no abdominal tenderness.  Musculoskeletal:        General: Normal range of  motion.     Cervical back: Normal range of motion.     Right lower leg: No edema.     Left lower leg: No edema.       Legs:     Comments: Reduced range of motion to left hip secondary to discomfort.  No cellulitic changes to area of discomfort.  Neurovascular intact  No midline spinous process tenderness to percussion or palpation.  No crepitus or step-off  Skin:    General: Skin is warm and dry.     Capillary Refill: Capillary refill takes less than 2 seconds.  Neurological:     Mental Status: She is alert and oriented to person, place, and time.     GCS: GCS eye subscore is 4. GCS verbal subscore is 5. GCS motor subscore is 6.     Cranial Nerves: Cranial nerves 2-12 are intact.     Sensory: Sensation is intact.     Motor: Motor function is intact.     Coordination: Coordination is intact.     Gait: Gait is intact.  Psychiatric:        Mood and Affect: Mood normal.        Behavior: Behavior normal.    ED Results / Procedures / Treatments   Labs (all labs ordered are listed, but only abnormal results are displayed) Labs Reviewed  URINALYSIS, ROUTINE W REFLEX MICROSCOPIC - Abnormal; Notable for the following components:      Result Value   Color, Urine COLORLESS (*)    Glucose, UA >=500 (*)    All other components within normal limits  URINALYSIS, MICROSCOPIC (REFLEX) - Abnormal; Notable for the following components:   Bacteria, UA RARE (*)    All other components within normal limits  LIPASE, BLOOD - Abnormal; Notable for the following components:   Lipase 91 (*)    All other components within normal limits  RESP PANEL BY RT-PCR (FLU A&B, COVID) ARPGX2  CBC  COMPREHENSIVE METABOLIC PANEL  TROPONIN I (HIGH SENSITIVITY)  TROPONIN I (HIGH SENSITIVITY)    EKG EKG Interpretation  Date/Time:  Monday August 09 2021 11:22:04 EST Ventricular Rate:  62 PR Interval:  156 QRS Duration: 82 QT Interval:  426 QTC Calculation: 432 R Axis:   63 Text Interpretation: Normal  sinus rhythm Normal ECG When compared with ECG of 06-Jan-2020 09:04, no significant change noted Confirmed by Madalyn Rob [phone]) on 08/09/2021 2:31:16 PM  Radiology CT ABDOMEN PELVIS W CONTRAST  Result Date: 08/09/2021 CLINICAL DATA:  Left-sided abdominal and flank pain. Some hip pain on left. Four days. Shortness of breath. EXAM: CT ABDOMEN AND PELVIS WITH CONTRAST TECHNIQUE: Multidetector CT imaging of the abdomen and pelvis was performed using the standard protocol following bolus administration of intravenous contrast. RADIATION DOSE REDUCTION: This exam was performed according to the departmental dose-optimization program which includes automated exposure control, adjustment of the mA and/or kV according to patient size and/or use of iterative reconstruction technique. CONTRAST:  194m OMNIPAQUE IOHEXOL 300 MG/ML  SOLN COMPARISON:  CT abdomen and pelvis 04/12/2017 FINDINGS: Lower chest: Lung bases are unremarkable. Hepatobiliary: Smooth liver contours. No focal liver mass is identified. The gallbladder is again surgically absent. No intrahepatic or extrahepatic biliary ductal dilatation. Pancreas: No mass or inflammatory fat stranding. No pancreatic ductal dilatation  is seen. Spleen: Normal in size without focal abnormality. Adrenals/Urinary Tract: Adrenal glands are unremarkable. The kidneys enhance uniformly and are symmetric in size without hydronephrosis. No renal stone is seen. No renal mass is seen. No focal urinary bladder wall thickening. Stomach/Bowel: Moderate diverticulosis throughout the descending colon and proximal sigmoid colon. Mild diverticulosis within the more proximal colon. Moderate stool is seen throughout the colon. The terminal ileum is unremarkable. The appendix is again not well identified however no inflammatory changes are seen around the cecum to indicate secondary signs of acute appendicitis. No bowel wall thickening or inflammatory change. No dilated loops of bowel to  indicate bowel obstruction. Vascular/Lymphatic: No abdominal aortic aneurysm. Moderate atherosclerotic calcifications within the abdominal aorta. No mesenteric, retroperitoneal, or pelvic lymphadenopathy. Reproductive: The uterus is again surgically absent. No adnexal mass is identified. Other: Tiny fat containing umbilical hernia is similar to prior although there Is now mild fluid present.No abdominopelvic ascites. No pneumoperitoneum. Musculoskeletal: Moderate to severe multilevel degenerative disc and endplate changes throughout the visualized thoracolumbar spine. Mild retrolisthesis of L2 on L3, unchanged. IMPRESSION:: IMPRESSION: 1. No significant change compared to 04/12/2017. 2. No acute abnormality is seen within the abdomen or pelvis. 3. Moderate diverticulosis without evidence of acute diverticulitis. Electronically Signed   By: Yvonne Kendall M.D.   On: 08/09/2021 18:08   DG Chest Portable 1 View  Result Date: 08/09/2021 CLINICAL DATA:  Left flank and abdominal pain for 4 days, short of breath since this morning EXAM: PORTABLE CHEST 1 VIEW COMPARISON:  01/05/2020 FINDINGS: Single frontal view of the chest demonstrates an unremarkable cardiac silhouette. No acute airspace disease, effusion, or pneumothorax. No acute bony abnormalities. IMPRESSION: 1. No acute intrathoracic process. Electronically Signed   By: Randa Ngo M.D.   On: 08/09/2021 16:45   DG HIP UNILAT WITH PELVIS 2-3 VIEWS LEFT  Result Date: 08/09/2021 CLINICAL DATA:  Left hip pain for 4 days. EXAM: DG HIP (WITH OR WITHOUT PELVIS) 2-3V LEFT COMPARISON:  None. FINDINGS: There is no evidence of hip fracture or dislocation. Mild joint space narrowing with marginal osteophytes. Heterotopic ossification about the greater trochanter. IMPRESSION: 1.  No evidence of fracture or dislocation. 2.  Mild hip osteoarthritis. Electronically Signed   By: Keane Police D.O.   On: 08/09/2021 16:48    Procedures Procedures    Medications Ordered  in ED Medications  lidocaine (LIDODERM) 5 % 1 patch (1 patch Transdermal Patch Applied 08/09/21 1733)  acetaminophen (TYLENOL) tablet 650 mg (650 mg Oral Given 08/09/21 1728)  famotidine (PEPCID) IVPB 20 mg premix (0 mg Intravenous Stopped 08/09/21 1903)  ondansetron (ZOFRAN) injection 4 mg (4 mg Intravenous Given 08/09/21 1804)  sodium chloride 0.9 % bolus 1,000 mL (0 mLs Intravenous Stopped 08/09/21 1941)  iohexol (OMNIPAQUE) 300 MG/ML solution 100 mL (100 mLs Intravenous Contrast Given 08/09/21 1734)  ketorolac (TORADOL) 15 MG/ML injection 15 mg (15 mg Intravenous Given 08/09/21 1910)    ED Course/ Medical Decision Making/ A&P                           Medical Decision Making Amount and/or Complexity of Data Reviewed Labs: ordered. Radiology: ordered.  Risk OTC drugs. Prescription drug management.    CC: mx complaints  This patient presents to the Emergency Department for the above complaint. This involves an extensive number of treatment options and is a complaint that carries with it a high risk of complications and morbidity. Vital signs were reviewed.  Serious etiologies considered.  Record review:  Previous records obtained and reviewed   Additional history obtained from son/caretaker  Medical and surgical history as noted above.   Work up as above, notable for:  Labs & imaging results that were available during my care of the patient were reviewed by me and considered in my medical decision making.   I ordered imaging studies which included CT abdomen pelvis, chest x-ray, hip left x-ray and reviewed imaging which showed diverticulosis without diverticulitis.  Osteophytes left hip  Cardiac monitoring reviewed and interpreted personally which shows NSR  Social determinants of health include - N/a  Labs reviewed and are stable.  Urinalysis does not demonstrate acute infection.  Viral swabs negative.  Management: Analgesics, lidocaine patch.  Reassessment:  Patient  reports significant treatment following intervention.  She is ambulatory with a steady gait.  Improve range of motion to left hip.  Imaging of hip negative.  Very low suspicion for occult fracture given patient's ability to ambulate with a steady gait.  No trauma.  Significant improvement to discomfort following intervention.  The patient improved significantly and was discharged in stable condition. Detailed discussions were had with the patient regarding current findings, and need for close f/u with PCP or on call doctor. The patient has been instructed to return immediately if the symptoms worsen in any way for re-evaluation. Patient verbalized understanding and is in agreement with current care plan. All questions answered prior to discharge.      This chart was dictated using voice recognition software.  Despite best efforts to proofread,  errors can occur which can change the documentation meaning.         Final Clinical Impression(s) / ED Diagnoses Final diagnoses:  Left hip pain  Left flank pain    Rx / DC Orders ED Discharge Orders          Ordered    methocarbamol (ROBAXIN) 500 MG tablet  Daily at bedtime        08/09/21 2017    lidocaine (LIDODERM) 5 %  Daily PRN        08/09/21 2017    acetaminophen (TYLENOL) 325 MG tablet  Every 6 hours PRN        08/09/21 2017    ibuprofen (ADVIL) 600 MG tablet  Every 6 hours PRN        08/09/21 2017              Jeanell Sparrow, DO 08/09/21 2022

## 2021-08-09 NOTE — ED Notes (Signed)
Lab draw attempt x 2 after triage unsuccessful.   Patient tol. fair.

## 2021-08-09 NOTE — Progress Notes (Addendum)
° ° °  Chronic Care Management Pharmacy Assistant   Name: Aarika Moon  MRN: [id number] DOB: 1940/11/11  [facility]   to follow up on patient assistance application for Amitza. Per representative at  [facility]  states patient has been approved starting 07/30/21 through 06/26/22.   [place]  to follow up on patient assistance application for Ozempic. Per representative at [employer] patient has been approved starting 08/01/21 through 06/26/22. Patient also needs her insurance cards front and back faxed to [employer] to be put on file.  Contacted [facility]  to follow up on patient assistance application for Jardiance. Per representative at [facility] cares states patient has been approved starting 08/09/21 through 06/26/22    [facility] Pharmacist Assistant [phone]

## 2021-08-11 NOTE — Progress Notes (Signed)
Subjective:    Patient ID: [name], female    DOB: 03-24-1941, 81 y.o.   MRN: [id number]  This visit occurred during the SARS-CoV-2 public health emergency.  Safety protocols were in place, including screening questions prior to the visit, additional usage of staff PPE, and extensive cleaning of exam room while observing appropriate contact time as indicated for disinfecting solutions.     HPI The patient is here for follow up from the ED.  ED on 2/13 for flank pain.  Had left sided flank pain x 3 days, left hip pain, mild SOB, joint pain and nausea w/o emesis.   Her flank pain was constant and mild.  W/u in ED, EKG, UA, cbc, cmp, troponin x 2, Hip xray, CXR negative. Lipasse elevated.  Ct A/P - no acute findings.  She received IVF, zofran, pepcid and ketoralac.  Symptoms improved with treatment in ED.  Pain started in her posterior left hip and she has pain in her left groin.  She thinks she sprained a muscle there.  She had to adjust her mattress the other day - she lifted up and pushed the mattress and felt a little something in the left hip area.    She is putting on lidocaine patches there.  She feels a little pain, but it is better.  She is taking ibuprofen and sometimes tylenol for it.  She is taking methocarbamol daily x 5 days.  Tomorrow is her last day.  She has a dry cough.  It happens in the morning and in the afternoon or evening.  She is having frequent GERD symptoms.  Her neuropathy is not controlled.  She states some dysuria.  She has a little bit of a vaginal discharge and wonder if she still has a yeast infection.  The discharge is pale yellow.  She denies any itching but did have some pain and she used a benzocaine spray.  Medications and allergies reviewed with patient and updated if appropriate.  Patient Active Problem List   Diagnosis Date Noted   Herpes simplex infection 03/05/2021   Mild neurocognitive disorder due to multiple etiologies  11/06/2020   Change in stool 01/23/2020   Hypokalemia 01/06/2020   Unstable angina 01/05/2020   Pain in joint of right shoulder 01/21/2019   CAD (coronary artery disease) 05/28/2018   Spondylolisthesis 09/20/2017   Degeneration of lumbar intervertebral disc 08/02/2017   Abdominal distension, gaseous 04/10/2017   Dysuria 04/10/2017   Hearing loss 02/01/2017   Osteoarthritis of left knee 01/12/2017   Constipation 01/10/2017   Hypercalcemia 10/12/2016   Chest pain 10/12/2016   Dyspnea on exertion 10/12/2016   Neck pain 09/27/2016   Laryngopharyngeal reflux (LPR) 09/27/2016   Presbycusis of both ears 09/27/2016   Lumbar pain 08/23/2016   Hair loss 08/23/2016   Situational anxiety    Myofascial pain 05/19/2015   Diverticulitis of large intestine 05/12/2014   Diabetic neuropathy 05/12/2014   Seizure disorder ([place]) 05/12/2014   Steatosis of liver 05/12/2014   Type 2 diabetes mellitus 05/12/2014   Cerebrovascular disease 03/13/2013   Allergic rhinitis 03/13/2013   Non-toxic multinodular goiter 05/18/2009   Vertigo 04/13/2009   Irritable bowel syndrome 03/20/2009   Insomnia 03/20/2009   Dyslipidemia 03/18/2009   Obstructive sleep apnea 03/18/2009   Gastroesophageal reflux disease 03/18/2009    Current Outpatient Medications on File Prior to Visit  Medication Sig Dispense Refill   acetaminophen (TYLENOL) 325 MG tablet Take 2 tablets (650 mg total) by  mouth every 6 (six) hours as needed (alternate with ibuprofen). 36 tablet 0   acetaminophen (TYLENOL) 500 MG tablet Take 500 mg by mouth every 6 (six) hours as needed for headache (pain).     albuterol (VENTOLIN HFA) 108 (90 Base) MCG/ACT inhaler Inhale 2 puffs into the lungs every 6 (six) hours as needed for wheezing or shortness of breath.     aspirin EC 81 MG tablet Take 1 tablet (81 mg total) by mouth daily. 90 tablet 3   clonazePAM (KLONOPIN) 0.5 MG tablet TAKE 1/2 TO 1 TABLET TWICE A DAY AS NEEDED FOR ANXIETY 60 tablet 0    Continuous Blood Gluc Receiver (FREESTYLE LIBRE READER) DEVI UAD to check sugars E11.9 1 each 0   Continuous Blood Gluc Sensor ([address]) MISC UAD to check sugars E11.9 28 each 11   empagliflozin (JARDIANCE) 25 MG TABS tablet Take 1 tablet (25 mg total) by mouth daily before breakfast. Via [employer] pt assistance 90 tablet 3   ezetimibe (ZETIA) 10 MG tablet TAKE ONE TABLET BY MOUTH ONCE DAILY 90 tablet 0   fluticasone (FLONASE) 50 MCG/ACT nasal spray Place 2 sprays into both nostrils in the morning and at bedtime.     glucose blood (ONETOUCH ULTRA) test strip USE UP TO 4 TIMES DAILY AS DIRECTED 400 strip 3   ibuprofen (ADVIL) 600 MG tablet Take 1 tablet (600 mg total) by mouth every 6 (six) hours as needed (alternate with acetaminophen). 30 tablet 0   Lancets MISC Use to check blood sugars up to 4 times daily as directed. E11.9 400 each 3   lidocaine (LIDODERM) 5 % Place 1 patch onto the skin daily as needed. Remove & Discard patch within 12 hours or as directed by MD 15 patch 0   lubiprostone ([name]) 24 MCG capsule Take 1 capsule (24 mcg total) by mouth 2 (two) times daily with a meal. 180 capsule 0   meclizine (ANTIVERT) 12.5 MG tablet TAKE 1 TABLET BY MOUTH 3 TIMES A DAY AS NEEDED FOR DIZZINESS 30 tablet 0   methocarbamol (ROBAXIN) 500 MG tablet Take 2 tablets (1,000 mg total) by mouth at bedtime for 5 days. 10 tablet 0   Multiple Vitamin (MULTIVITAMIN WITH MINERALS) TABS tablet Take 1 tablet by mouth daily after lunch.     nitroGLYCERIN (NITROSTAT) 0.4 MG SL tablet Place 1 tablet (0.4 mg total) under the tongue every 5 (five) minutes as needed for chest pain. 25 tablet 2   omeprazole (PRILOSEC) 20 MG capsule TAKE ONE CAPSULE BY MOUTH EVERY MORNING 90 capsule 1   OVER THE COUNTER MEDICATION Place 1 drop into both eyes 2 (two) times daily as needed (dry eyes). Over the counter eye drop for itching     OXcarbazepine (TRILEPTAL) 150 MG tablet TAKE ONE TABLET BY MOUTH TWICE DAILY  180 tablet 1   OZEMPIC, 0.25 OR 0.5 MG/DOSE, 2 MG/1.5ML SOPN Inject 0.375 mLs (0.5 mg total) into the skin once a week. 1.5 mL 2   Polyvinyl Alcohol-Povidone (REFRESH OP) Place 1 drop into both eyes daily as needed (dry eyes).     potassium chloride SA (KLOR-CON M) 20 MEQ tablet TAKE ONE TABLET BY MOUTH THREE TIMES DAILY 270 tablet 1   pregabalin (LYRICA) 75 MG capsule TAKE ONE CAPSULE BY MOUTH EVERY MORNING, NOON AND night 90 capsule 4   Probiotic Product (PROBIOTIC PO) Take 1 capsule by mouth daily after breakfast.     rosuvastatin (CRESTOR) 5 MG tablet TAKE ONE  TABLET BY MOUTH EVERYDAY AT BEDTIME 90 tablet 4   [DISCONTINUED] Calcium Carbonate (CALCIUM 500 PO) Take 1 capsule by mouth every other day.      No current facility-administered medications on file prior to visit.    Past Medical History:  Diagnosis Date   Abdominal distension, gaseous 04/10/2017   Allergic rhinitis 03/13/2013   CAD (coronary artery disease) 05/28/2018   Cerebrovascular disease    Chest pain 10/12/2016   Degeneration of lumbar intervertebral disc 08/02/2017   Diabetic neuropathy 05/12/2014   Did not tolerated cymbalta - hives, itching Tried nortriptyline - not effective Did not tolerate Effexor-nausea gabapentin-not effective    Diverticulitis of large intestine 05/12/2014   Dyslipidemia    Gastroesophageal reflux disease 03/18/2009   Hearing loss 02/01/2017   Hypercalcemia 10/12/2016   Hypertension    Hypokalemia 01/06/2020   Insomnia 03/20/2009   Irritable bowel syndrome 03/20/2009   With chronic constipation   Laryngopharyngeal reflux (LPR) 09/27/2016   Lumbar pain 08/23/2016   Mild neurocognitive disorder due to multiple etiologies 11/06/2020   Myofascial pain 05/19/2015   Neck pain 09/27/2016   Non-toxic multinodular goiter 05/18/2009   Obstructive sleep apnea 03/18/2009   Not using CPAP   Osteoarthritis of left knee 01/12/2017   Osteoarthritis of shoulder region    Pain in joint of right shoulder 01/21/2019    Presbycusis of both ears 09/27/2016   Seizure disorder    Situational anxiety    Spondylolisthesis 09/20/2017   Steatosis of liver 05/12/2014   Type 2 diabetes mellitus 05/12/2014   Unstable angina 01/05/2020   Vertigo 04/13/2009    Past Surgical History:  Procedure Laterality Date   ABDOMINAL HYSTERECTOMY  1970's   Partial   APPENDECTOMY     CHOLECYSTECTOMY     LEFT HEART CATH AND CORONARY ANGIOGRAPHY N/A 05/28/2018   Procedure: LEFT HEART CATH AND CORONARY ANGIOGRAPHY;  Surgeon: Belva Crome, MD;  Location: [facility];  Service: Cardiovascular;  Laterality: N/A;   LEFT HEART CATH AND CORONARY ANGIOGRAPHY N/A 01/06/2020   Procedure: LEFT HEART CATH AND CORONARY ANGIOGRAPHY;  Surgeon: Belva Crome, MD;  Location: [facility];  Service: Cardiovascular;  Laterality: N/A;   LUMBAR EPIDURAL INJECTION Left 08/25/2017   SHOULDER SURGERY  2008   LT, post fall    TONSILLECTOMY AND ADENOIDECTOMY     TOTAL KNEE ARTHROPLASTY Left 01/12/2017   Procedure: LEFT TOTAL KNEE ARTHROPLASTY;  Surgeon: Susa Day, MD;  Location: [facility];  Service: Orthopedics;  Laterality: Left;  120 mins    Social History   Socioeconomic History   Marital status: Widowed    Spouse name: Not on file   Number of children: 2   Years of education: 16   Highest education level: Bachelor's degree (e.g., BA, AB, BS)  Occupational History   Occupation: Retired    Comment: admin at [employer]  Tobacco Use   Smoking status: Former    Types: Cigarettes    Quit date: 10/19/1985    Years since quitting: 35.8   Smokeless tobacco: Never  Vaping Use   Vaping Use: Never used  Substance and Sexual Activity   Alcohol use: Yes    Alcohol/week: 0.0 standard drinks    Comment: rare glass of wine   Drug use: No   Sexual activity: Never  Other Topics Concern   Not on file  Social History Narrative   Patient lives in a one story home.  Has 2 children.  Retired from [employer].  Social Determinants of Health    Financial Resource Strain: Low Risk    Difficulty of Paying Living Expenses: Not hard at all  Food Insecurity: No Food Insecurity   Worried About Charity fundraiser in the Last Year: Never true   [place] in the Last Year: Never true  Transportation Needs: No Transportation Needs   Lack of Transportation (Medical): No   Lack of Transportation (Non-Medical): No  Physical Activity: Sufficiently Active   Days of Exercise per Week: 5 days   Minutes of Exercise per Session: 30 min  Stress: No Stress Concern Present   Feeling of Stress : Not at all  Social Connections: Socially Integrated   Frequency of Communication with Friends and Family: More than three times a week   Frequency of Social Gatherings with Friends and Family: More than three times a week   Attends Religious Services: More than 4 times per year   Active Member of [employer] or Organizations: Yes   Attends Music therapist: More than 4 times per year   Marital Status: Married    Family History  Problem Relation Age of Onset   Arthritis Mother    Heart disease Father    Arthritis Other        Grandmother   Diabetes Other        Grandmother   Colon cancer Neg Hx    Stomach cancer Neg Hx    Pancreatic cancer Neg Hx     Review of Systems  Constitutional:  Negative for fever.  Respiratory:  Positive for cough (dry).   Gastrointestinal:        Jerrye Bushy - feels it every other day  Genitourinary:  Positive for dysuria, frequency (chronic), urgency and vaginal discharge (pale yellow). Negative for hematuria.      Objective:   Vitals:   08/12/21 1028  BP: 112/78  Pulse: 63  Temp: 98.5 F (36.9 C)  SpO2: 95%   BP Readings from Last 3 Encounters:  08/12/21 112/78  08/09/21 130/75  03/05/21 114/80   Wt Readings from Last 3 Encounters:  08/12/21 172 lb 12.8 oz (78.4 kg)  08/09/21 167 lb 15.9 oz (76.2 kg)  03/05/21 168 lb (76.2 kg)   Body mass index is 31.61 kg/m.   Physical Exam     Constitutional: Appears well-developed and well-nourished. No distress.  HENT:  Head: Normocephalic and atraumatic.  Neck: Neck supple. No tracheal deviation present. No thyromegaly present.  No cervical lymphadenopathy Cardiovascular: Normal rate, regular rhythm and normal heart sounds.   No murmur heard. No carotid bruit .  No edema Pulmonary/Chest: Effort normal and breath sounds normal. No respiratory distress. No has no wheezes. No rales. Musculoskeletal: Some tenderness with palpation left groin radiating to posterior hip-increased with movement Skin: Skin is warm and dry. Not diaphoretic.  Psychiatric: Normal mood and affect. Behavior is normal.    Lab Results  Component Value Date   WBC 7.2 08/09/2021   HGB 14.5 08/09/2021   HCT 43.9 08/09/2021   PLT 225 08/09/2021   GLUCOSE 83 08/09/2021   CHOL 148 08/31/2020   TRIG 94.0 08/31/2020   HDL 68.00 08/31/2020   LDLDIRECT 113.0 07/21/2017   LDLCALC 61 08/31/2020   ALT 25 08/09/2021   AST 30 08/09/2021   NA 138 08/09/2021   K 4.3 08/09/2021   CL 101 08/09/2021   CREATININE 0.64 08/09/2021   BUN 14 08/09/2021   CO2 26 08/09/2021   TSH 1.39 02/06/2019  INR 1.00 01/03/2017   HGBA1C 7.0 (H) 03/05/2021   MICROALBUR 3.1 (H) 08/31/2020    CT ABDOMEN PELVIS W CONTRAST CLINICAL DATA:  Left-sided abdominal and flank pain. Some hip pain on left. Four days. Shortness of breath.  EXAM: CT ABDOMEN AND PELVIS WITH CONTRAST  TECHNIQUE: Multidetector CT imaging of the abdomen and pelvis was performed using the standard protocol following bolus administration of intravenous contrast.  RADIATION DOSE REDUCTION: This exam was performed according to the departmental dose-optimization program which includes automated exposure control, adjustment of the mA and/or kV according to patient size and/or use of iterative reconstruction technique.  CONTRAST:  184mL OMNIPAQUE IOHEXOL 300 MG/ML  SOLN  COMPARISON:  CT abdomen and pelvis  04/12/2017  FINDINGS: Lower chest: Lung bases are unremarkable.  Hepatobiliary: Smooth liver contours. No focal liver mass is identified. The gallbladder is again surgically absent. No intrahepatic or extrahepatic biliary ductal dilatation.  Pancreas: No mass or inflammatory fat stranding. No pancreatic ductal dilatation is seen.  Spleen: Normal in size without focal abnormality.  Adrenals/Urinary Tract: Adrenal glands are unremarkable. The kidneys enhance uniformly and are symmetric in size without hydronephrosis. No renal stone is seen. No renal mass is seen. No focal urinary bladder wall thickening.  Stomach/Bowel: Moderate diverticulosis throughout the descending colon and proximal sigmoid colon. Mild diverticulosis within the more proximal colon. Moderate stool is seen throughout the colon. The terminal ileum is unremarkable. The appendix is again not well identified however no inflammatory changes are seen around the cecum to indicate secondary signs of acute appendicitis. No bowel wall thickening or inflammatory change. No dilated loops of bowel to indicate bowel obstruction.  Vascular/Lymphatic: No abdominal aortic aneurysm. Moderate atherosclerotic calcifications within the abdominal aorta. No mesenteric, retroperitoneal, or pelvic lymphadenopathy.  Reproductive: The uterus is again surgically absent. No adnexal mass is identified.  Other: Tiny fat containing umbilical hernia is similar to prior although there Is now mild fluid present.No abdominopelvic ascites. No pneumoperitoneum.  Musculoskeletal: Moderate to severe multilevel degenerative disc and endplate changes throughout the visualized thoracolumbar spine. Mild retrolisthesis of L2 on L3, unchanged.  IMPRESSION:: IMPRESSION: 1. No significant change compared to 04/12/2017. 2. No acute abnormality is seen within the abdomen or pelvis. 3. Moderate diverticulosis without evidence of acute  diverticulitis.  Electronically Signed   By: Yvonne Kendall M.D.   On: 08/09/2021 18:08 DG HIP UNILAT WITH PELVIS 2-3 VIEWS LEFT CLINICAL DATA:  Left hip pain for 4 days.  EXAM: DG HIP (WITH OR WITHOUT PELVIS) 2-3V LEFT  COMPARISON:  None.  FINDINGS: There is no evidence of hip fracture or dislocation. Mild joint space narrowing with marginal osteophytes. Heterotopic ossification about the greater trochanter.  IMPRESSION: 1.  No evidence of fracture or dislocation.  2.  Mild hip osteoarthritis.  Electronically Signed   By: Keane Police D.O.   On: 08/09/2021 16:48 DG Chest Portable 1 View CLINICAL DATA:  Left flank and abdominal pain for 4 days, short of breath since this morning  EXAM: PORTABLE CHEST 1 VIEW  COMPARISON:  01/05/2020  FINDINGS: Single frontal view of the chest demonstrates an unremarkable cardiac silhouette. No acute airspace disease, effusion, or pneumothorax. No acute bony abnormalities.  IMPRESSION: 1. No acute intrathoracic process.  Electronically Signed   By: Randa Ngo M.D.   On: 08/09/2021 16:45     Assessment & Plan:    See Problem List for Assessment and Plan of chronic medical problems.     Follow-up in 4 weeks

## 2021-08-12 ENCOUNTER — Encounter: Payer: Self-pay | Admitting: Internal Medicine

## 2021-08-12 ENCOUNTER — Other Ambulatory Visit: Payer: Self-pay

## 2021-08-12 ENCOUNTER — Ambulatory Visit (INDEPENDENT_AMBULATORY_CARE_PROVIDER_SITE_OTHER): Payer: HMO | Admitting: Internal Medicine

## 2021-08-12 VITALS — BP 112/78 | HR 63 | Temp 98.5°F | Ht 62.0 in | Wt 172.8 lb

## 2021-08-12 DIAGNOSIS — B3731 Acute candidiasis of vulva and vagina: Secondary | ICD-10-CM | POA: Diagnosis not present

## 2021-08-12 DIAGNOSIS — K219 Gastro-esophageal reflux disease without esophagitis: Secondary | ICD-10-CM | POA: Diagnosis not present

## 2021-08-12 DIAGNOSIS — R300 Dysuria: Secondary | ICD-10-CM | POA: Diagnosis not present

## 2021-08-12 DIAGNOSIS — E1142 Type 2 diabetes mellitus with diabetic polyneuropathy: Secondary | ICD-10-CM

## 2021-08-12 LAB — URINALYSIS, ROUTINE W REFLEX MICROSCOPIC
Bilirubin Urine: NEGATIVE
Hgb urine dipstick: NEGATIVE
Ketones, ur: NEGATIVE
Leukocytes,Ua: NEGATIVE
Nitrite: NEGATIVE
RBC / HPF: NONE SEEN (ref 0–?)
Specific Gravity, Urine: 1.02 (ref 1.000–1.030)
Total Protein, Urine: NEGATIVE
Urine Glucose: >=1000 — AB
Urobilinogen, UA: 0.2 (ref 0.0–1.0)
pH: 5.5 (ref 5.0–8.0)

## 2021-08-12 MED ORDER — PREGABALIN 100 MG PO CAPS: 100.0000 mg | ORAL_CAPSULE | Freq: Three times a day (TID) | ORAL | 0 refills | Status: DC

## 2021-08-12 MED ORDER — OMEPRAZOLE 40 MG PO CPDR: 40.0000 mg | DELAYED_RELEASE_CAPSULE | Freq: Every day | ORAL | 1 refills | Status: DC

## 2021-08-12 MED ORDER — FLUCONAZOLE 150 MG PO TABS: 150.0000 mg | ORAL_TABLET | Freq: Once | ORAL | 0 refills | Status: AC

## 2021-08-12 NOTE — Assessment & Plan Note (Signed)
Acute ?  Yeast infection She is on Jardiance which increases her risk Her some of her symptoms did improve after fluconazole 150 mg x 1 We will repeat fluconazole 150 mg x 1 If symptoms persist no UTI may need to see GYN

## 2021-08-12 NOTE — Assessment & Plan Note (Signed)
Chronic Not ideally controlled Increase lyrica to 100 mg TID

## 2021-08-12 NOTE — Patient Instructions (Addendum)
° ° ° °  Urine tests were ordered.     Medications changes include :   diflucan 150 mg x 1 for possible yeast infection, increase lyrica to 100 mg three times a day, increase omeprazole to 40 mg daily   Your prescription(s) have been sent to your pharmacy.     Return in about 4 weeks (around 09/09/2021) for follow up.

## 2021-08-12 NOTE — Assessment & Plan Note (Signed)
Acute Check UA, urine culture

## 2021-08-12 NOTE — Assessment & Plan Note (Signed)
Chronic Not controlled Possible cause of her cough Increase omeprazole to 40 mg daily

## 2021-08-13 LAB — URINE CULTURE: Result:: NO GROWTH

## 2021-08-24 ENCOUNTER — Ambulatory Visit: Payer: HMO | Admitting: Internal Medicine

## 2021-08-24 DIAGNOSIS — E1142 Type 2 diabetes mellitus with diabetic polyneuropathy: Secondary | ICD-10-CM

## 2021-08-24 DIAGNOSIS — I10 Essential (primary) hypertension: Secondary | ICD-10-CM | POA: Diagnosis not present

## 2021-08-24 DIAGNOSIS — I2510 Atherosclerotic heart disease of native coronary artery without angina pectoris: Secondary | ICD-10-CM

## 2021-08-24 DIAGNOSIS — E785 Hyperlipidemia, unspecified: Secondary | ICD-10-CM

## 2021-08-31 ENCOUNTER — Telehealth: Payer: Self-pay

## 2021-08-31 NOTE — Telephone Encounter (Signed)
Pt is requesting a CB. Pt CB [phone]. ?Pt has questions about  ?pregabalin (LYRICA) 100 MG capsule ?OZEMPIC, 0.25 OR 0.5 MG/DOSE, 2 MG/1.5ML SOPN ?Wants Dr. Quay Burow recommendation of the last 5th COVID Booster ? ?Please advise ?

## 2021-09-01 NOTE — Telephone Encounter (Signed)
Spoke with patient and all questions answered.

## 2021-09-08 ENCOUNTER — Encounter: Payer: Self-pay | Admitting: Internal Medicine

## 2021-09-08 NOTE — Patient Instructions (Addendum)
? ? ? ?  Blood work was ordered.   ? ? ?Medications changes include :   increase ozempic to 1 mg weekly ? ? ?Your prescription(s) have been sent to your pharmacy.  ? ? ?A referral was ordered for a gynecologist and gastroenterology ( Dr Fuller Plan).     Someone from that office will call you to schedule an appointment.  ? ? ?Return in about 6 months (around 03/12/2022) for CPE. ? ?

## 2021-09-08 NOTE — Progress Notes (Signed)
? ? ? ? ?Subjective:  ? ? Patient ID: [name], female    DOB: 1941-01-27, 81 y.o.   MRN: [id number] ? ?This visit occurred during the SARS-CoV-2 public health emergency.  Safety protocols were in place, including screening questions prior to the visit, additional usage of staff PPE, and extensive cleaning of exam room while observing appropriate contact time as indicated for disinfecting solutions.   ? ? ?HPI ?[name] is here for follow up of her chronic medical problems, including diabetic neuropathy, GERD, DM, hld, situational anxiety ? ? ?4 weeks ago we increased her lyrica to 100 mg tid and omeprazole to 40 mg daily.  She is tolerating increased doses well and feels that it has helped. ? ?Medications and allergies reviewed with patient and updated if appropriate. ? ?Current Outpatient Medications on File Prior to Visit  ?Medication Sig Dispense Refill  ? acetaminophen (TYLENOL) 325 MG tablet Take 2 tablets (650 mg total) by mouth every 6 (six) hours as needed (alternate with ibuprofen). 36 tablet 0  ? acetaminophen (TYLENOL) 500 MG tablet Take 500 mg by mouth every 6 (six) hours as needed for headache (pain).    ? albuterol (VENTOLIN HFA) 108 (90 Base) MCG/ACT inhaler Inhale 2 puffs into the lungs every 6 (six) hours as needed for wheezing or shortness of breath.    ? aspirin EC 81 MG tablet Take 1 tablet (81 mg total) by mouth daily. 90 tablet 3  ? clonazePAM (KLONOPIN) 0.5 MG tablet TAKE 1/2 TO 1 TABLET TWICE A DAY AS NEEDED FOR ANXIETY 60 tablet 0  ? Continuous Blood Gluc Receiver (FREESTYLE LIBRE READER) DEVI UAD to check sugars E11.9 1 each 0  ? Continuous Blood Gluc Sensor ([place]) MISC UAD to check sugars E11.9 28 each 11  ? empagliflozin (JARDIANCE) 25 MG TABS tablet Take 1 tablet (25 mg total) by mouth daily before breakfast. Via [employer] pt assistance 90 tablet 3  ? ezetimibe (ZETIA) 10 MG tablet TAKE ONE TABLET BY MOUTH ONCE DAILY 90 tablet 0  ? fluticasone (FLONASE)  50 MCG/ACT nasal spray Place 2 sprays into both nostrils in the morning and at bedtime.    ? glucose blood (ONETOUCH ULTRA) test strip USE UP TO 4 TIMES DAILY AS DIRECTED 400 strip 3  ? ibuprofen (ADVIL) 600 MG tablet Take 1 tablet (600 mg total) by mouth every 6 (six) hours as needed (alternate with acetaminophen). 30 tablet 0  ? Lancets MISC Use to check blood sugars up to 4 times daily as directed. E11.9 400 each 3  ? lidocaine (LIDODERM) 5 % Place 1 patch onto the skin daily as needed. Remove & Discard patch within 12 hours or as directed by MD 15 patch 0  ? lubiprostone ([name]) 24 MCG capsule Take 1 capsule (24 mcg total) by mouth 2 (two) times daily with a meal. 180 capsule 0  ? meclizine (ANTIVERT) 12.5 MG tablet TAKE 1 TABLET BY MOUTH 3 TIMES A DAY AS NEEDED FOR DIZZINESS 30 tablet 0  ? Multiple Vitamin (MULTIVITAMIN WITH MINERALS) TABS tablet Take 1 tablet by mouth daily after lunch.    ? nitroGLYCERIN (NITROSTAT) 0.4 MG SL tablet Place 1 tablet (0.4 mg total) under the tongue every 5 (five) minutes as needed for chest pain. 25 tablet 2  ? omeprazole (PRILOSEC) 40 MG capsule Take 1 capsule (40 mg total) by mouth daily. 90 capsule 1  ? OVER THE COUNTER MEDICATION Place 1 drop into both eyes 2 (two)  times daily as needed (dry eyes). Over the counter eye drop for itching    ? OXcarbazepine (TRILEPTAL) 150 MG tablet TAKE ONE TABLET BY MOUTH TWICE DAILY 180 tablet 1  ? OZEMPIC, 0.25 OR 0.5 MG/DOSE, 2 MG/1.5ML SOPN Inject 0.375 mLs (0.5 mg total) into the skin once a week. 1.5 mL 2  ? Polyvinyl Alcohol-Povidone (REFRESH OP) Place 1 drop into both eyes daily as needed (dry eyes).    ? potassium chloride SA (KLOR-CON M) 20 MEQ tablet TAKE ONE TABLET BY MOUTH THREE TIMES DAILY 270 tablet 1  ? pregabalin (LYRICA) 100 MG capsule Take 1 capsule (100 mg total) by mouth 3 (three) times daily. 90 capsule 0  ? Probiotic Product (PROBIOTIC PO) Take 1 capsule by mouth daily after breakfast.    ? rosuvastatin (CRESTOR) 5 MG  tablet TAKE ONE TABLET BY MOUTH EVERYDAY AT BEDTIME 90 tablet 4  ? [DISCONTINUED] Calcium Carbonate (CALCIUM 500 PO) Take 1 capsule by mouth every other day.     ? ?No current facility-administered medications on file prior to visit.  ? ? ? ?Review of Systems  ?Constitutional:  Negative for chills and fever.  ?Respiratory:  Negative for cough, shortness of breath and wheezing.   ?Cardiovascular:  Negative for chest pain, palpitations and leg swelling.  ?Genitourinary:  Positive for vaginal discharge (yellow).  ?     No vaginal/vulvar itch  ?Neurological:  Negative for light-headedness and headaches.  ? ?   ?Objective:  ? ?Vitals:  ? 09/09/21 1050  ?BP: 112/68  ?Pulse: (!) 58  ?Temp: 98.5 ?F (36.9 ?C)  ?SpO2: 96%  ? ?BP Readings from Last 3 Encounters:  ?09/09/21 112/68  ?08/12/21 112/78  ?08/09/21 130/75  ? ?Wt Readings from Last 3 Encounters:  ?09/09/21 174 lb 9.6 oz (79.2 kg)  ?08/12/21 172 lb 12.8 oz (78.4 kg)  ?08/09/21 167 lb 15.9 oz (76.2 kg)  ? ?Body mass index is 31.93 kg/m?. ? ?  ?Physical Exam ?Constitutional:   ?   General: She is not in acute distress. ?   Appearance: Normal appearance.  ?HENT:  ?   Head: Normocephalic and atraumatic.  ?Eyes:  ?   Conjunctiva/sclera: Conjunctivae normal.  ?Cardiovascular:  ?   Rate and Rhythm: Normal rate and regular rhythm.  ?   Heart sounds: Normal heart sounds. No murmur heard. ?Pulmonary:  ?   Effort: Pulmonary effort is normal. No respiratory distress.  ?   Breath sounds: Normal breath sounds. No wheezing.  ?Musculoskeletal:  ?   Cervical back: Neck supple.  ?   Right lower leg: No edema.  ?   Left lower leg: No edema.  ?Lymphadenopathy:  ?   Cervical: No cervical adenopathy.  ?Skin: ?   Findings: No rash.  ?Neurological:  ?   Mental Status: She is alert. Mental status is at baseline.  ?Psychiatric:     ?   Mood and Affect: Mood normal.     ?   Behavior: Behavior normal.  ? ?   ? ?Lab Results  ?Component Value Date  ? WBC 7.2 08/09/2021  ? HGB 14.5 08/09/2021  ? HCT  43.9 08/09/2021  ? PLT 225 08/09/2021  ? GLUCOSE 83 08/09/2021  ? CHOL 148 08/31/2020  ? TRIG 94.0 08/31/2020  ? HDL 68.00 08/31/2020  ? LDLDIRECT 113.0 07/21/2017  ? [place] 61 08/31/2020  ? ALT 25 08/09/2021  ? AST 30 08/09/2021  ? NA 138 08/09/2021  ? K 4.3 08/09/2021  ? CL 101 08/09/2021  ?  CREATININE 0.64 08/09/2021  ? BUN 14 08/09/2021  ? CO2 26 08/09/2021  ? TSH 1.39 02/06/2019  ? INR 1.00 01/03/2017  ? HGBA1C 7.0 (H) 03/05/2021  ? MICROALBUR 3.1 (H) 08/31/2020  ? ? ? ?Assessment & Plan:  ? ? ?See Problem List for Assessment and Plan of chronic medical problems.  ? ? ?

## 2021-09-09 ENCOUNTER — Ambulatory Visit (INDEPENDENT_AMBULATORY_CARE_PROVIDER_SITE_OTHER): Payer: HMO | Admitting: Internal Medicine

## 2021-09-09 ENCOUNTER — Other Ambulatory Visit: Payer: Self-pay | Admitting: Internal Medicine

## 2021-09-09 ENCOUNTER — Other Ambulatory Visit: Payer: Self-pay

## 2021-09-09 VITALS — BP 112/68 | HR 58 | Temp 98.5°F | Ht 62.0 in | Wt 174.6 lb

## 2021-09-09 DIAGNOSIS — I2510 Atherosclerotic heart disease of native coronary artery without angina pectoris: Secondary | ICD-10-CM

## 2021-09-09 DIAGNOSIS — Z1159 Encounter for screening for other viral diseases: Secondary | ICD-10-CM

## 2021-09-09 DIAGNOSIS — F418 Other specified anxiety disorders: Secondary | ICD-10-CM

## 2021-09-09 DIAGNOSIS — K219 Gastro-esophageal reflux disease without esophagitis: Secondary | ICD-10-CM

## 2021-09-09 DIAGNOSIS — Z1211 Encounter for screening for malignant neoplasm of colon: Secondary | ICD-10-CM

## 2021-09-09 DIAGNOSIS — R300 Dysuria: Secondary | ICD-10-CM | POA: Diagnosis not present

## 2021-09-09 DIAGNOSIS — E785 Hyperlipidemia, unspecified: Secondary | ICD-10-CM | POA: Diagnosis not present

## 2021-09-09 DIAGNOSIS — E1142 Type 2 diabetes mellitus with diabetic polyneuropathy: Secondary | ICD-10-CM

## 2021-09-09 DIAGNOSIS — N898 Other specified noninflammatory disorders of vagina: Secondary | ICD-10-CM

## 2021-09-09 LAB — URINALYSIS, ROUTINE W REFLEX MICROSCOPIC
Bilirubin Urine: NEGATIVE
Hgb urine dipstick: NEGATIVE
Ketones, ur: NEGATIVE
Leukocytes,Ua: NEGATIVE
Nitrite: NEGATIVE
RBC / HPF: NONE SEEN (ref 0–?)
Specific Gravity, Urine: 1.015 (ref 1.000–1.030)
Total Protein, Urine: NEGATIVE
Urine Glucose: >=1000 — AB
Urobilinogen, UA: 0.2 (ref 0.0–1.0)
pH: 5.5 (ref 5.0–8.0)

## 2021-09-09 LAB — COMPREHENSIVE METABOLIC PANEL
ALT: 21 U/L (ref 0–35)
AST: 26 U/L (ref 0–37)
Albumin: 4.7 g/dL (ref 3.5–5.2)
Alkaline Phosphatase: 75 U/L (ref 39–117)
BUN: 17 mg/dL (ref 6–23)
CO2: 33 mEq/L — ABNORMAL HIGH (ref 19–32)
Calcium: 10.4 mg/dL (ref 8.4–10.5)
Chloride: 101 mEq/L (ref 96–112)
Creatinine, Ser: 0.8 mg/dL (ref 0.40–1.20)
GFR: 70.42 mL/min (ref >60.00)
Glucose, Bld: 106 mg/dL — ABNORMAL HIGH (ref 70–99)
Potassium: 3.9 mEq/L (ref 3.5–5.1)
Sodium: 141 mEq/L (ref 135–145)
Total Bilirubin: 0.4 mg/dL (ref 0.2–1.2)
Total Protein: 7.9 g/dL (ref 6.0–8.3)

## 2021-09-09 LAB — MICROALBUMIN / CREATININE URINE RATIO
Creatinine,U: 55.1 mg/dL
Microalb Creat Ratio: 5 mg/g (ref 0.0–30.0)
Microalb, Ur: 2.7 mg/dL — ABNORMAL HIGH (ref 0.0–1.9)

## 2021-09-09 LAB — LIPID PANEL
Cholesterol: 176 mg/dL (ref 0–200)
HDL: 71.8 mg/dL (ref >39.00)
LDL Cholesterol: 83 mg/dL (ref 0–99)
NonHDL: 104.57
Total CHOL/HDL Ratio: 2
Triglycerides: 107 mg/dL (ref 0.0–149.0)
VLDL: 21.4 mg/dL (ref 0.0–40.0)

## 2021-09-09 LAB — HEMOGLOBIN A1C: Hgb A1c MFr Bld: 7.1 % — ABNORMAL HIGH (ref 4.6–6.5)

## 2021-09-09 MED ORDER — SEMAGLUTIDE (1 MG/DOSE) 4 MG/3ML ~~LOC~~ SOPN: 1.0000 mg | PEN_INJECTOR | SUBCUTANEOUS | 1 refills | Status: DC

## 2021-09-09 NOTE — Assessment & Plan Note (Signed)
Chronic Regular exercise and healthy diet encouraged Check lipid panel  Continue Zetia 10 mg daily, Crestor 5 mg daily 

## 2021-09-09 NOTE — Assessment & Plan Note (Signed)
Chronic ?Controlled, stable ?Continue clonazepam 05 mg bid prn -- does not take often ? ?

## 2021-09-09 NOTE — Assessment & Plan Note (Signed)
Chronic ?Lab Results  ?Component Value Date  ? HGBA1C 7.0 (H) 03/05/2021  ? ?Sugars  controlled ?Check A1c, urine microalbumin today ?Continue ozempic but increase to 1 mg weekly, continue jardiance 25 mg daily ?Stressed regular exercise, diabetic diet ? ? ?

## 2021-09-09 NOTE — Assessment & Plan Note (Signed)
Chronic ?Improved with increased dose of omeprazole ?GERD controlled ?Continue omeprazole 40 mg daily  ? ?

## 2021-09-09 NOTE — Assessment & Plan Note (Signed)
Chronic ?No symptoms consistent with angina ?She plans on following up with her cardiologist since its been a while since she has seen him ?Continue aspirin 81 mg daily, Zetia 10 mg daily and Crestor 5 mg daily, Jardiance 25 mg daily ?Blood pressure well controlled ?She plans on joining the gym and will exercise more.  Work on weight loss ?

## 2021-09-09 NOTE — Assessment & Plan Note (Signed)
Subacute ?No improvement with fluconazole ?Does not sound typical of a yeast infection, but she is on Jardiance ?We will refer to GYN for further evaluation ?

## 2021-09-09 NOTE — Assessment & Plan Note (Signed)
Chronic ?Improved with increased lyrica dose ?Continue lyrica 100 mg TID ?

## 2021-09-16 LAB — CULTURE, URINE COMPREHENSIVE

## 2021-09-16 LAB — HEPATITIS C ANTIBODY
Hepatitis C Ab: NONREACTIVE
SIGNAL TO CUT-OFF: 0.02 (ref <1.00)

## 2021-09-27 ENCOUNTER — Telehealth: Payer: Self-pay

## 2021-09-27 NOTE — Telephone Encounter (Signed)
Patient assistance arrived today for Ozempic. ? ?Patient notified and meds left in fridge with vaccines in nurse station. ?

## 2021-10-06 ENCOUNTER — Other Ambulatory Visit: Payer: Self-pay | Admitting: Internal Medicine

## 2021-10-06 DIAGNOSIS — K5900 Constipation, unspecified: Secondary | ICD-10-CM

## 2021-10-26 ENCOUNTER — Other Ambulatory Visit: Payer: Self-pay | Admitting: Internal Medicine

## 2021-10-27 ENCOUNTER — Telehealth: Payer: HMO

## 2021-10-27 NOTE — Progress Notes (Deleted)
Chronic Care Management Pharmacy Note  10/27/2021 Name:  [name] MRN:  [id number] DOB:  08/02/1940  Summary: -Patient reports compliance to current medications  -BG has been better controlled averaging 100-120 - highest patient could recall was 629  -PAP applications for amitiza, ozempic, and jardiance filled out  -Patient notes to discharge / pain in groin region x 1 month - increase in urinary frequency  - (likely yeast infection / UTI - on jardiance)  Recommendations/Changes made from today's visit: -Recommending no changes to medications, patient to continue ozempic 0.29m weekly - as recent BG has been improved, would expect next A1c to be <7.0% - should next A1c be elevated from goal can increase ozempic to 166mweekly  -fluconazole rx'd by PCP for patient, urine test ordered for possible UTI - patient to make clinic aware should symptoms persist  -Will update patient on PAP applications when applications are processed  -Patient to make appointment with cardiology as it has been >1 year since she has seen, and PCP as she is overdue for visit   Plan: -F/u in 3 months   Subjective: [name] an [age].o. year old female who is a primary patient of Burns, StClaudina LickMD.  The CCM team was consulted for assistance with disease management and care coordination needs.    Engaged with patient face to face for follow up visit in response to provider referral for pharmacy case management and/or care coordination services.   Consent to Services:  The patient was given the following information about Chronic Care Management services today, agreed to services, and gave verbal consent: 1. CCM service includes personalized support from designated clinical staff supervised by the primary care provider, including individualized plan of care and coordination with other care providers 2. 24/7 contact phone numbers for assistance for urgent and routine care needs. 3. Service will  only be billed when office clinical staff spend 20 minutes or more in a month to coordinate care. 4. Only one practitioner may furnish and bill the service in a calendar month. 5.The patient may stop CCM services at any time (effective at the end of the month) by phone call to the office staff. 6. The patient will be responsible for cost sharing (co-pay) of up to 20% of the service fee (after annual deductible is met). Patient agreed to services and consent obtained.  Patient Care Team: BuBinnie RailMD as PCP - General (Internal Medicine) SmBelva CromeMD as PCP - Cardiology (Cardiology) BeSusa DayMD (Orthopedic Surgery) RoIzora GalaMD ReBlanch MediaMD (Neurology) McSatira SarkMD (Cardiology) SoChesley MiresMD (Pulmonary Disease) StErline LevineMD (Neurosurgery) LuDruscilla BrownieMD (Dermatology) GrDebbra RidingMD as Consulting Physician (Ophthalmology) SzTomasa BlaseRPKahi MohalaPharmacist)  Recent office visits: 09/09/2021 - Dr. BuQuay Burow- vaginal discharge - no improvement with fluconazole - referred to GYN - increased ozempic to 94m36meekly  08/12/2021 - Dr. BurQuay Burowdysuria - repeat fluconazole x 1 dose - lyrica increased to 100m38mD, omeprazole increase to 40mg10mly  - f/u in 4 weeks   Recent consult visits: None in previous 6 months   Hospital visits: 08/09/2021 - ER visit for flank pain - imaging of hip negative - low suspicion of fracture - rx'd IBU, lidocaine patches, methocarbamol, and APAP on discharge   Objective:  Lab Results  Component Value Date   CREATININE 0.80 09/09/2021   BUN 17 09/09/2021   GFR 70.42 09/09/2021  GFRNONAA >60 08/09/2021   GFRAA 88 01/23/2020   NA 141 09/09/2021   K 3.9 09/09/2021   CALCIUM 10.4 09/09/2021   CO2 33 (H) 09/09/2021   GLUCOSE 106 (H) 09/09/2021    Lab Results  Component Value Date/Time   HGBA1C 7.1 (H) 09/09/2021 11:46 AM   HGBA1C 7.0 (H) 03/05/2021 02:23 PM   GFR 70.42 09/09/2021 11:46 AM   GFR  70.68 03/05/2021 02:23 PM   MICROALBUR 2.7 (H) 09/09/2021 11:46 AM   MICROALBUR 3.1 (H) 08/31/2020 12:26 PM    Last diabetic Eye exam:  Lab Results  Component Value Date/Time   HMDIABEYEEXA No Retinopathy 11/01/2018 12:00 AM    Last diabetic Foot exam:  No results found for: HMDIABFOOTEX   Lab Results  Component Value Date   CHOL 176 09/09/2021   HDL 71.80 09/09/2021   LDLCALC 83 09/09/2021   LDLDIRECT 113.0 07/21/2017   TRIG 107.0 09/09/2021   CHOLHDL 2 09/09/2021       Latest Ref Rng & Units 09/09/2021   11:46 AM 08/09/2021    4:50 PM 03/05/2021    2:23 PM  Hepatic Function  Total Protein 6.0 - 8.3 g/dL 7.9   7.4   7.1    Albumin 3.5 - 5.2 g/dL 4.7   4.1   4.3    AST 0 - 37 U/L _0 ALT 0 - 35 U/L _1 Alk Phosphatase 39 - 117 U/L 75   59   68    Total Bilirubin 0.2 - 1.2 mg/dL 0.4   0.4   0.4      Lab Results  Component Value Date/Time   TSH 1.39 02/06/2019 12:20 PM   TSH 3.53 07/21/2017 04:43 PM       Latest Ref Rng & Units 08/09/2021    3:11 PM 08/31/2020   12:26 PM 01/06/2020    3:19 AM  CBC  WBC 4.0 - 10.5 K/uL 7.2   6.3   7.3    Hemoglobin 12.0 - 15.0 g/dL 14.5   14.9   13.9    Hematocrit 36.0 - 46.0 % 43.9   44.7   41.8    Platelets 150 - 400 K/uL 225   215.0   234      No results found for: VD25OH  Clinical ASCVD: No  The 10-year ASCVD risk score (Arnett DK, et al., 2019) is: 33.2%   Values used to calculate the score:     Age: 38 years     Sex: Female     Is Non-Hispanic African American: Yes     Diabetic: Yes     Tobacco smoker: No     Systolic Blood Pressure: 834 mmHg     Is BP treated: Yes     HDL Cholesterol: 71.8 mg/dL     Total Cholesterol: 176 mg/dL       08/13/2021   10:09 AM 08/13/2021   10:08 AM 08/13/2021   10:07 AM  Depression screen PHQ 2/9  Decreased Interest 0 1 1  Down, Depressed, Hopeless 0 0 0  PHQ - 2 Score 0 1 1  Altered sleeping  1   Tired, decreased energy  0   Change in appetite  0   Feeling  bad or failure about yourself   0   Trouble concentrating  0   Moving slowly or fidgety/restless  0   Suicidal thoughts  0  PHQ-9 Score  2   Difficult doing work/chores  Not difficult at all     Social History   Tobacco Use  Smoking Status Former   Types: Cigarettes   Quit date: 10/19/1985   Years since quitting: 36.0  Smokeless Tobacco Never   BP Readings from Last 3 Encounters:  09/09/21 112/68  08/12/21 112/78  08/09/21 130/75   Pulse Readings from Last 3 Encounters:  09/09/21 (!) 58  08/12/21 63  08/09/21 (!) 54   Wt Readings from Last 3 Encounters:  09/09/21 174 lb 9.6 oz (79.2 kg)  08/12/21 172 lb 12.8 oz (78.4 kg)  08/09/21 167 lb 15.9 oz (76.2 kg)   BMI Readings from Last 3 Encounters:  09/09/21 31.93 kg/m  08/12/21 31.61 kg/m  08/09/21 30.73 kg/m    Assessment/Interventions: Review of patient past medical history, allergies, medications, health status, including review of consultants reports, laboratory and other test data, was performed as part of comprehensive evaluation and provision of chronic care management services.   SDOH:  (Social Determinants of Health) assessments and interventions performed: Yes  SDOH Screenings   Alcohol Screen: Low Risk    Last Alcohol Screening Score (AUDIT): 1  Depression (PHQ2-9): Low Risk    PHQ-2 Score: 0  Financial Resource Strain: Low Risk    Difficulty of Paying Living Expenses: Not hard at all  Food Insecurity: No Food Insecurity   Worried About Charity fundraiser in the Last Year: Never true   Ran Out of Food in the Last Year: Never true  Housing: Low Risk    Last Housing Risk Score: 0  Physical Activity: Sufficiently Active   Days of Exercise per Week: 5 days   Minutes of Exercise per Session: 30 min  Social Connections: Engineer, building services of Communication with Friends and Family: More than three times a week   Frequency of Social Gatherings with Friends and Family: More than three times a  week   Attends Religious Services: More than 4 times per year   Active Member of [employer] or Organizations: Yes   Attends Music therapist: More than 4 times per year   Marital Status: Married  Stress: No Stress Concern Present   Feeling of Stress : Not at all  Tobacco Use: Medium Risk   Smoking Tobacco Use: Former   Smokeless Tobacco Use: Never   Passive Exposure: Not on Pensions consultant Needs: No Transportation Needs   Lack of Transportation (Medical): No   Lack of Transportation (Non-Medical): No    CCM Care Plan  Allergies  Allergen Reactions   Cymbalta [Duloxetine Hcl] Hives and Itching   Effexor [Venlafaxine] Nausea Only   Linzess [Linaclotide]     diarrhea   Other Other (See Comments)    "SEEDED" food due to stomach issues   Statins Nausea And Vomiting   Penicillins Hives and Rash    Has patient had a PCN reaction causing immediate rash, facial/tongue/throat swelling, SOB or lightheadedness with hypotension: Yes Has patient had a PCN reaction causing severe rash involving mucus membranes or skin necrosis: No Has patient had a PCN reaction that required hospitalization: No Has patient had a PCN reaction occurring within the last 10 years: Yes If all of the above answers are "NO", then may proceed with Cephalosporin use.     Medications Reviewed Today     Reviewed by Binnie Rail, MD (Physician) on 09/08/21 at 2101  Med List Status: <None>   Medication Order Taking?  Sig Documenting Provider Last Dose Status Informant  acetaminophen (TYLENOL) 325 MG tablet [id number]  Take 2 tablets (650 mg total) by mouth every 6 (six) hours as needed (alternate with ibuprofen). Jeanell Sparrow, DO  Active   acetaminophen (TYLENOL) 500 MG tablet [id number]  Take 500 mg by mouth every 6 (six) hours as needed for headache (pain). [provider]  Active Multiple Informants  albuterol (VENTOLIN HFA) 108 (90 Base) MCG/ACT inhaler [id number]  Inhale 2 puffs into the  lungs every 6 (six) hours as needed for wheezing or shortness of breath. [provider]  Active Multiple Informants  aspirin EC 81 MG tablet [id number]  Take 1 tablet (81 mg total) by mouth daily. Belva Crome, MD  Active Multiple Informants    Discontinued 09/26/14 2148 (Inpatient Standard) clonazePAM (KLONOPIN) 0.5 MG tablet [id number]  TAKE 1/2 TO 1 TABLET TWICE A DAY AS NEEDED FOR ANXIETY Burns, Claudina Lick, MD  Active   Continuous Blood Gluc Receiver (FREESTYLE LIBRE READER) DEVI [id number]  UAD to check sugars E11.9 Binnie Rail, MD  Active   Continuous Blood Gluc Sensor ([place]) MISC [id number]  UAD to check sugars E11.9 Binnie Rail, MD  Active   empagliflozin (JARDIANCE) 25 MG TABS tablet [id number]  Take 1 tablet (25 mg total) by mouth daily before breakfast. Via [employer] pt assistance Binnie Rail, MD  Active   ezetimibe (ZETIA) 10 MG tablet [id number]  TAKE ONE TABLET BY MOUTH ONCE DAILY Binnie Rail, MD  Active   fluticasone (FLONASE) 50 MCG/ACT nasal spray [id number]  Place 2 sprays into both nostrils in the morning and at bedtime. [provider]  Active   glucose blood (ONETOUCH ULTRA) test strip [id number]  USE UP TO 4 TIMES DAILY AS DIRECTED Burns, Claudina Lick, MD  Active Multiple Informants  ibuprofen (ADVIL) 600 MG tablet [id number]  Take 1 tablet (600 mg total) by mouth every 6 (six) hours as needed (alternate with acetaminophen). Jeanell Sparrow, DO  Active   Lancets MISC [id number]  Use to check blood sugars up to 4 times daily as directed. E11.9 Binnie Rail, MD  Active Multiple Informants  lidocaine (LIDODERM) 5 % [id number]  Place 1 patch onto the skin daily as needed. Remove & Discard patch within 12 hours or as directed by MD Jeanell Sparrow, DO  Active   lubiprostone ([name]) 24 MCG capsule [id number]  Take 1 capsule (24 mcg total) by mouth 2 (two) times daily with a meal. Burns, Claudina Lick, MD  Active   meclizine (ANTIVERT) 12.5 MG tablet  [id number]  TAKE 1 TABLET BY MOUTH 3 TIMES A DAY AS NEEDED FOR DIZZINESS Burns, Claudina Lick, MD  Active   Multiple Vitamin (MULTIVITAMIN WITH MINERALS) TABS tablet [id number]  Take 1 tablet by mouth daily after lunch. [provider]  Active Multiple Informants  nitroGLYCERIN (NITROSTAT) 0.4 MG SL tablet [id number]  Place 1 tablet (0.4 mg total) under the tongue every 5 (five) minutes as needed for chest pain. Binnie Rail, MD  Active   omeprazole (PRILOSEC) 40 MG capsule [id number]  Take 1 capsule (40 mg total) by mouth daily. Binnie Rail, MD  Active   OVER THE COUNTER MEDICATION [id number]  Place 1 drop into both eyes 2 (two) times daily as needed (dry eyes). Over the counter eye drop for itching [provider]  Active Multiple Informants  OXcarbazepine (TRILEPTAL) 150 MG tablet [id number]  TAKE ONE TABLET BY MOUTH  TWICE DAILY Burns, Claudina Lick, MD  Active   OZEMPIC, 0.25 OR 0.5 MG/DOSE, 2 MG/1.5ML SOPN [id number]  Inject 0.375 mLs (0.5 mg total) into the skin once a week. Binnie Rail, MD  Active   Polyvinyl Alcohol-Povidone [facility] OP) [id number]  Place 1 drop into both eyes daily as needed (dry eyes). [provider]  Active Multiple Informants  potassium chloride SA (KLOR-CON M) 20 MEQ tablet [id number]  TAKE ONE TABLET BY MOUTH THREE TIMES DAILY Burns, Claudina Lick, MD  Active   pregabalin (LYRICA) 100 MG capsule [id number]  Take 1 capsule (100 mg total) by mouth 3 (three) times daily. Binnie Rail, MD  Active   Probiotic Product (PROBIOTIC PO) [id number]  Take 1 capsule by mouth daily after breakfast. [provider]  Active Multiple Informants           Med Note (SUMME, COLLEEN E   Fri Feb 01, 2019  4:07 PM)    rosuvastatin (CRESTOR) 5 MG tablet [id number]  TAKE ONE TABLET BY MOUTH EVERYDAY AT BEDTIME Binnie Rail, MD  Active             Patient Active Problem List   Diagnosis Date Noted   Vaginal discharge 09/09/2021   Herpes simplex infection 03/05/2021    Mild neurocognitive disorder due to multiple etiologies 11/06/2020   Change in stool 01/23/2020   Hypokalemia 01/06/2020   Unstable angina 01/05/2020   Pain in joint of right shoulder 01/21/2019   CAD (coronary artery disease) 05/28/2018   Spondylolisthesis 09/20/2017   Degeneration of lumbar intervertebral disc 08/02/2017   Abdominal distension, gaseous 04/10/2017   Hearing loss 02/01/2017   Osteoarthritis of left knee 01/12/2017   Constipation 01/10/2017   Hypercalcemia 10/12/2016   Chest pain 10/12/2016   Dyspnea on exertion 10/12/2016   Neck pain 09/27/2016   Laryngopharyngeal reflux (LPR) 09/27/2016   Presbycusis of both ears 09/27/2016   Lumbar pain 08/23/2016   Hair loss 08/23/2016   Situational anxiety    Myofascial pain 05/19/2015   Diverticulitis of large intestine 05/12/2014   Diabetic neuropathy 05/12/2014   Seizure disorder ([place]) 05/12/2014   Steatosis of liver 05/12/2014   Type 2 diabetes mellitus 05/12/2014   Cerebrovascular disease 03/13/2013   Allergic rhinitis 03/13/2013   Non-toxic multinodular goiter 05/18/2009   Vertigo 04/13/2009   Irritable bowel syndrome 03/20/2009   Insomnia 03/20/2009   Dyslipidemia 03/18/2009   Obstructive sleep apnea 03/18/2009   Gastroesophageal reflux disease 03/18/2009    Immunization History  Administered Date(s) Administered   Fluad Quad(high Dose 65+) 03/20/2019   Influenza Split 03/28/2011, 02/26/2012, 03/20/2013   Influenza Whole 02/25/2009, 04/01/2010   Influenza, High Dose Seasonal PF 03/03/2017, 05/28/2018, 03/12/2020   Influenza-Unspecified 09/05/2014, 03/27/2016, 02/25/2017, 03/05/2021   PFIZER Comirnaty(Gray Top)Covid-19 Tri-Sucrose Vaccine 11/15/2020   PFIZER(Purple Top)SARS-COV-2 Vaccination 07/16/2019, 08/06/2019, 03/23/2020   Pneumococcal Conjugate-13 09/14/2015   Pneumococcal Polysaccharide-23 06/28/2007, 10/11/2017   Tetanus 05/02/2012   Zoster Recombinat (Shingrix) 01/27/2020, 05/19/2020     Conditions to be addressed/monitored:  Hypertension, Hyperlipidemia, Diabetes, and Coronary Artery Disease  There are no care plans that you recently modified to display for this patient.      Medication Assistance:  PAP applications for amitiza, ozempic, and jardiance completed today   Compliance/Adherence/Medication fill history: Care Gaps: Hep C screening Ophthalmology exam COVID boost   Patient's preferred pharmacy is:  [facility] - [place], [place] - [address] [place] [address] Phone:  [phone] Fax: [phone]  [id number] [address] [place]Phone: [phone]: [phone]  Uses pill box? Yes Pt endorses 100% compliance  Care Plan and Follow Up Patient Decision:  Patient agrees to Care Plan and Follow-up.  Plan: Telephone follow up appointment with care management team member scheduled for:  3 months  The patient has been provided with contact information for the care management team and has been advised to call with any health related questions or concerns.   DTomasa Blase PharmD Clinical Pharmacist, [place]

## 2021-10-28 ENCOUNTER — Other Ambulatory Visit: Payer: Self-pay | Admitting: Internal Medicine

## 2021-10-28 NOTE — Telephone Encounter (Signed)
Check [place] registry last filled  10/08/2021. Pls advise.../lmb ? ?

## 2021-11-08 ENCOUNTER — Other Ambulatory Visit: Payer: Self-pay | Admitting: Internal Medicine

## 2021-11-08 DIAGNOSIS — I2510 Atherosclerotic heart disease of native coronary artery without angina pectoris: Secondary | ICD-10-CM

## 2021-11-09 ENCOUNTER — Telehealth: Payer: Self-pay

## 2021-11-09 NOTE — Telephone Encounter (Signed)
Patient informed today that patient assistance came in today. ? ?Ozempic placed in the fridge today. ?

## 2021-11-16 ENCOUNTER — Encounter: Payer: Self-pay | Admitting: Gastroenterology

## 2021-11-26 ENCOUNTER — Other Ambulatory Visit: Payer: Self-pay | Admitting: Internal Medicine

## 2021-12-23 ENCOUNTER — Encounter: Payer: Self-pay | Admitting: Gastroenterology

## 2021-12-23 ENCOUNTER — Ambulatory Visit: Payer: HMO | Admitting: Gastroenterology

## 2021-12-23 VITALS — BP 120/70 | HR 77 | Ht 62.0 in | Wt 170.0 lb

## 2021-12-23 DIAGNOSIS — R194 Change in bowel habit: Secondary | ICD-10-CM | POA: Diagnosis not present

## 2021-12-23 DIAGNOSIS — K219 Gastro-esophageal reflux disease without esophagitis: Secondary | ICD-10-CM

## 2021-12-23 DIAGNOSIS — K582 Mixed irritable bowel syndrome: Secondary | ICD-10-CM | POA: Diagnosis not present

## 2021-12-23 MED ORDER — DICYCLOMINE HCL 10 MG PO CAPS: 10.0000 mg | ORAL_CAPSULE | Freq: Three times a day (TID) | ORAL | 11 refills | Status: DC

## 2021-12-23 MED ORDER — NA SULFATE-K SULFATE-MG SULF 17.5-3.13-1.6 GM/177ML PO SOLN: 1.0000 | Freq: Once | ORAL | 0 refills | Status: AC

## 2021-12-23 NOTE — Progress Notes (Signed)
    Assessment     Suspected IBS with alternating pattern. R/O IBD, colorectal neoplasms GERD   Recommendations    Schedule colonoscopy. The risks (including bleeding, perforation, infection, missed lesions, medication reactions and possible hospitalization or surgery if complications occur), benefits, and alternatives to colonoscopy with possible biopsy and possible polypectomy were discussed with the patient and they consent to proceed.   Dicyclomine 10 mg tid ac Review low FODMAP diet and avoid foods that exacerbate diarrhea Hold Amitza when having diarrhea Continue omeprazole 40 mg qd, TUMS prn and follow antireflux measures    HPI    This is a 81 year old female with a history of CAD, DM, OSA, seizure disorder, GERD and IBS.  She was previously evaluated in October 2021 for IBS and GERD.  She previously underwent colonoscopy in November 2010 which showed mild sigmoid diverticulosis and 2 small hyperplastic colon polyps.  She relates long-term problems with constipation alternating with diarrhea.  She is maintained on [name] which partially controls her constipation. She takes a senna laxative intermittently.  She notes frequent lower abdominal discomfort and frequent abdominal bloating.  Her symptoms have not significantly changed for several years.  She is maintained on omeprazole 40 mg daily for GERD and states she has reflux symptoms a few days per week that responds to Tums.  No other gastrointestinal complaints. Denies weight loss, change in stool caliber, melena, hematochezia, nausea, vomiting, dysphagia, chest pain.    Labs / Imaging       Latest Ref Rng & Units 09/09/2021   11:46 AM 08/09/2021    4:50 PM 03/05/2021    2:23 PM  Hepatic Function  Total Protein 6.0 - 8.3 g/dL 7.9  7.4  7.1   Albumin 3.5 - 5.2 g/dL 4.7  4.1  4.3   AST 0 - 37 U/L _0 ALT 0 - 35 U/L _1 Alk Phosphatase 39 - 117 U/L 75  59  68   Total Bilirubin 0.2 - 1.2 mg/dL 0.4  0.4  0.4         Latest Ref Rng & Units 08/09/2021    3:11 PM 08/31/2020   12:26 PM 01/06/2020    3:19 AM  CBC  WBC 4.0 - 10.5 K/uL 7.2  6.3  7.3   Hemoglobin 12.0 - 15.0 g/dL 14.5  14.9  13.9   Hematocrit 36.0 - 46.0 % 43.9  44.7  41.8   Platelets 150 - 400 K/uL 225  215.0  234     Current Medications, Allergies, Past Medical History, Past Surgical History, Family History and Social History were reviewed in [employer] record.   Physical Exam: General: Well developed, well nourished, no acute distress Head: Normocephalic and atraumatic Eyes: Sclerae anicteric, EOMI Ears: Normal auditory acuity Mouth: Not examined Lungs: Clear throughout to auscultation Heart: Regular rate and rhythm; no murmurs, rubs or bruits Abdomen: Soft, non tender and non distended. No masses, hepatosplenomegaly or hernias noted. Normal Bowel sounds Rectal: Deferred to colonoscopy Musculoskeletal: Symmetrical with no gross deformities  Pulses:  Normal pulses noted Extremities: No clubbing, cyanosis, edema or deformities noted Neurological: Alert oriented x 4, grossly nonfocal Psychological:  Alert and cooperative. Normal mood and affect   [name] Plan, MD 12/23/2021, 8:38 AM

## 2021-12-23 NOTE — Patient Instructions (Addendum)
We have sent the following medications to your pharmacy for you to pick up at your convenience: dicyclomine.  You have been given a low fod-map diet to follow.   Patient advised to avoid spicy, acidic, citrus, chocolate, mints, fruit and fruit juices.  Limit the intake of caffeine, alcohol and Soda.  Don't exercise too soon after eating.  Don't lie down within 3-4 hours of eating.  Elevate the head of your bed.  Please remember to hold [name] when you are experiencing diarrhea.   You have been scheduled for a colonoscopy. Please follow written instructions given to you at your visit today.  Please pick up your prep supplies at the pharmacy within the next 1-3 days. If you use inhalers (even only as needed), please bring them with you on the day of your procedure.  Due to recent changes in healthcare laws, you may see the results of your imaging and laboratory studies on MyChart before your provider has had a chance to review them.  We understand that in some cases there may be results that are confusing or concerning to you. Not all laboratory results come back in the same time frame and the provider may be waiting for multiple results in order to interpret others.  Please give [place] 48 hours in order for your provider to thoroughly review all the results before contacting the office for clarification of your results.   The [facility] providers would like to encourage you to use [facility] to communicate with providers for non-urgent requests or questions.  Due to long hold times on the telephone, sending your provider a message by [facility] may be a faster and more efficient way to get a response.  Please allow 48 business hours for a response.  Please remember that this is for non-urgent requests.    Thank you for choosing me and [facility].  Pricilla Riffle. Dagoberto Ligas., MD., Mar[facility] Regal

## 2021-12-27 ENCOUNTER — Other Ambulatory Visit: Payer: Self-pay | Admitting: Internal Medicine

## 2022-01-04 ENCOUNTER — Other Ambulatory Visit: Payer: Self-pay | Admitting: Internal Medicine

## 2022-01-19 ENCOUNTER — Ambulatory Visit (AMBULATORY_SURGERY_CENTER): Payer: HMO | Admitting: Gastroenterology

## 2022-01-19 ENCOUNTER — Encounter: Payer: Self-pay | Admitting: Gastroenterology

## 2022-01-19 VITALS — BP 141/73 | HR 61 | Temp 98.0°F | Resp 13 | Ht 62.0 in | Wt 170.0 lb

## 2022-01-19 DIAGNOSIS — R194 Change in bowel habit: Secondary | ICD-10-CM

## 2022-01-19 DIAGNOSIS — K5730 Diverticulosis of large intestine without perforation or abscess without bleeding: Secondary | ICD-10-CM | POA: Diagnosis not present

## 2022-01-19 DIAGNOSIS — K6389 Other specified diseases of intestine: Secondary | ICD-10-CM

## 2022-01-19 DIAGNOSIS — K5792 Diverticulitis of intestine, part unspecified, without perforation or abscess without bleeding: Secondary | ICD-10-CM | POA: Diagnosis not present

## 2022-01-19 MED ORDER — SODIUM CHLORIDE 0.9 % IV SOLN: 500.0000 mL | Freq: Once | INTRAVENOUS | Status: DC

## 2022-01-19 NOTE — Progress Notes (Signed)
Pt's states no medical or surgical changes since previsit or office visit. 

## 2022-01-19 NOTE — Patient Instructions (Signed)

## 2022-01-19 NOTE — Progress Notes (Signed)
See 12/23/2021 H&P, no changes

## 2022-01-19 NOTE — Progress Notes (Signed)
Sedate, gd SR, tolerated procedure well, VSS, report to RN 

## 2022-01-19 NOTE — Op Note (Signed)
[facility] Patient Name: [name] Procedure Date: 01/19/2022 2:52 PM MRN: [id number] Endoscopist: Ladene Artist , MD Age: 81 Referring MD:  Date of Birth: 05-Oct-1940 Gender: Female Account #: [id number] Procedure:                Colonoscopy Indications:              Change in bowel habits Medicines:                Monitored Anesthesia Care Procedure:                Pre-Anesthesia Assessment:                           - Prior to the procedure, a History and Physical                            was performed, and patient medications and                            allergies were reviewed. The patient's tolerance of                            previous anesthesia was also reviewed. The risks                            and benefits of the procedure and the sedation                            options and risks were discussed with the patient.                            All questions were answered, and informed consent                            was obtained. Prior Anticoagulants: The patient has                            taken no previous anticoagulant or antiplatelet                            agents. ASA Grade Assessment: III - A patient with                            severe systemic disease. After reviewing the risks                            and benefits, the patient was deemed in                            satisfactory condition to undergo the procedure.                           After obtaining informed consent, the colonoscope  was passed under direct vision. Throughout the                            procedure, the patient's blood pressure, pulse, and                            oxygen saturations were monitored continuously. The                            CF [id number] #[id number] was introduced through the anus                            and advanced to the the cecum, identified by                            appendiceal orifice and ileocecal  valve. The                            ileocecal valve, appendiceal orifice, and rectum                            were photographed. The quality of the bowel                            preparation was adequate. The colonoscopy was                            performed without difficulty. The patient tolerated                            the procedure well. Scope In: 2:55:01 PM Scope Out: 3:15:35 PM Scope Withdrawal Time: 0 hours 15 minutes 16 seconds  Total Procedure Duration: 0 hours 20 minutes 34 seconds  Findings:                 The perianal and digital rectal examinations were                            normal.                           Scattered medium-mouthed diverticula were found in                            the right colon. There was no evidence of                            diverticular bleeding.                           Multiple small and medium-mouthed diverticula were                            found in the left colon. There was evidence of  diverticular spasm. There was no evidence of                            diverticular bleeding.                           A patchy area of mild melanosis was found in the                            sigmoid colon and in the descending colon.                           The exam was otherwise without abnormality on                            direct and retroflexion views. Complications:            No immediate complications. Estimated blood loss:                            None. Estimated Blood Loss:     Estimated blood loss: none. Impression:               - Mild diverticulosis in the right colon.                           - Moderate diverticulosis in the left colon.                           - Mild melanosis in the colon.                           - The examination was otherwise normal on direct                            and retroflexion views.                           - No specimens  collected. Recommendation:           - Patient has a contact number available for                            emergencies. The signs and symptoms of potential                            delayed complications were discussed with the                            patient. Return to normal activities tomorrow.                            Written discharge instructions were provided to the                            patient.                           -  High fiber diet.                           - Continue present medications.                           - No repeat colonoscopy due to age and the absence                            of colonic polyps.                           - Return to PCP for ongoing care. Ladene Artist, MD 01/19/2022 3:21:25 PM This report has been signed electronically.

## 2022-01-20 ENCOUNTER — Telehealth: Payer: Self-pay | Admitting: *Deleted

## 2022-01-20 NOTE — Telephone Encounter (Signed)
Mailbox full on f/u call

## 2022-01-30 ENCOUNTER — Other Ambulatory Visit: Payer: Self-pay | Admitting: Internal Medicine

## 2022-02-03 ENCOUNTER — Other Ambulatory Visit: Payer: Self-pay | Admitting: Internal Medicine

## 2022-02-03 DIAGNOSIS — I2510 Atherosclerotic heart disease of native coronary artery without angina pectoris: Secondary | ICD-10-CM

## 2022-02-03 DIAGNOSIS — K5900 Constipation, unspecified: Secondary | ICD-10-CM

## 2022-02-25 ENCOUNTER — Telehealth: Payer: Self-pay

## 2022-02-25 NOTE — Telephone Encounter (Signed)
LM for pt to call back and schedule AWV-S with either [place] or Nurse AWV. Must be scheduled anytime after 04/29/22.

## 2022-02-28 ENCOUNTER — Other Ambulatory Visit: Payer: Self-pay | Admitting: Internal Medicine

## 2022-02-28 DIAGNOSIS — E1142 Type 2 diabetes mellitus with diabetic polyneuropathy: Secondary | ICD-10-CM

## 2022-03-09 ENCOUNTER — Other Ambulatory Visit: Payer: Self-pay | Admitting: Internal Medicine

## 2022-03-10 ENCOUNTER — Other Ambulatory Visit: Payer: Self-pay | Admitting: Internal Medicine

## 2022-03-13 ENCOUNTER — Encounter: Payer: Self-pay | Admitting: Internal Medicine

## 2022-03-13 NOTE — Progress Notes (Unsigned)
Subjective:    Patient ID: [name], female    DOB: [birth year], 81 y.o.   MRN: [id number]     HPI [name] is here for follow up of her chronic medical problems, including DM, hld, GERD, diabetic neuropathy, CAD, anxiety, hypokalemia  She is not swallowing well.  She has difficulty with bread, but not apple sauce.  She has GERD 2-3 times a week.   She has soreness on the right side of her neck, which is not new.    Vaginal discharge - yellow, dysuria sometimes, some itchy, vulvar swelling.    Chronic lower back pain  - sees Dr Lindon Romp about steroid injections    Medications and allergies reviewed with patient and updated if appropriate.  Current Outpatient Medications on File Prior to Visit  Medication Sig Dispense Refill   acetaminophen (TYLENOL) 500 MG tablet Take 500 mg by mouth every 6 (six) hours as needed for headache (pain).     albuterol (VENTOLIN HFA) 108 (90 Base) MCG/ACT inhaler Inhale 2 puffs into the lungs every 6 (six) hours as needed for wheezing or shortness of breath.     aspirin EC 81 MG tablet Take 1 tablet (81 mg total) by mouth daily. 90 tablet 3   clonazePAM (KLONOPIN) 0.5 MG tablet TAKE 1/2 TO 1 TABLET TWICE A DAY AS NEEDED FOR ANXIETY 60 tablet 3   Continuous Blood Gluc Receiver (FREESTYLE LIBRE READER) DEVI UAD to check sugars E11.9 1 each 0   Continuous Blood Gluc Sensor (FREESTYLE LIBRE 2 SENSOR) MISC Apply new sensor every 14 days to monitor blood glucose. Remove old sensor before applying new one. 1 each 11   dicyclomine (BENTYL) 10 MG capsule Take 1 capsule (10 mg total) by mouth 3 (three) times daily before meals. 90 capsule 11   ezetimibe (ZETIA) 10 MG tablet TAKE ONE TABLET BY MOUTH ONCE DAILY 90 tablet 0   fluticasone (FLONASE) 50 MCG/ACT nasal spray Place 2 sprays into both nostrils in the morning and at bedtime.     glucose blood (ONETOUCH ULTRA) test strip USE UP TO 4 TIMES DAILY AS DIRECTED 400 strip 3   ibuprofen (ADVIL)  600 MG tablet Take 1 tablet (600 mg total) by mouth every 6 (six) hours as needed (alternate with acetaminophen). 30 tablet 0   JARDIANCE 25 MG TABS tablet TAKE ONE TABLET BY MOUTH DAILY BEFORE BREAKFAST 90 tablet 1   Lancets MISC Use to check blood sugars up to 4 times daily as directed. E11.9 400 each 3   lidocaine (LIDODERM) 5 % Place 1 patch onto the skin daily as needed. Remove & Discard patch within 12 hours or as directed by MD 15 patch 0   lubiprostone ([name]) 24 MCG capsule TAKE ONE CAPSULE BY MOUTH TWICE DAILY WITH A MEAL 180 capsule 0   meclizine (ANTIVERT) 12.5 MG tablet TAKE 1 TABLET BY MOUTH THREE TIMES A DAY AS NEEDED FOR DIZZINESS 30 tablet 0   Multiple Vitamin (MULTIVITAMIN WITH MINERALS) TABS tablet Take 1 tablet by mouth daily after lunch.     nitroGLYCERIN (NITROSTAT) 0.4 MG SL tablet Place 1 tablet (0.4 mg total) under the tongue every 5 (five) minutes as needed for chest pain. 25 tablet 2   omeprazole (PRILOSEC) 40 MG capsule TAKE ONE CAPSULE BY MOUTH ONCE DAILY 90 capsule 1   OVER THE COUNTER MEDICATION Place 1 drop into both eyes 2 (two) times daily as needed (dry eyes). Over the counter eye drop for  itching     OXcarbazepine (TRILEPTAL) 150 MG tablet TAKE ONE TABLET BY MOUTH TWICE DAILY 180 tablet 1   Polyvinyl Alcohol-Povidone (REFRESH OP) Place 1 drop into both eyes daily as needed (dry eyes).     potassium chloride SA (KLOR-CON M) 20 MEQ tablet TAKE ONE TABLET BY MOUTH THREE TIMES DAILY 270 tablet 1   pregabalin (LYRICA) 100 MG capsule TAKE ONE CAPSULE BY MOUTH THREE TIMES DAILY 90 capsule 1   Probiotic Product (PROBIOTIC PO) Take 1 capsule by mouth daily after breakfast.     rosuvastatin (CRESTOR) 5 MG tablet TAKE ONE TABLET BY MOUTH EVERYDAY AT BEDTIME 90 tablet 4   Semaglutide, 1 MG/DOSE, 4 MG/3ML SOPN Inject 1 mg as directed once a week. 9 mL 1   [DISCONTINUED] Calcium Carbonate (CALCIUM 500 PO) Take 1 capsule by mouth every other day.      No current  facility-administered medications on file prior to visit.     Review of Systems  Constitutional:  Negative for fever.  HENT:  Positive for trouble swallowing.   Respiratory:  Positive for cough (every once in a while). Negative for shortness of breath and wheezing.   Cardiovascular:  Positive for chest pain (once in a while). Negative for palpitations and leg swelling.  Gastrointestinal:  Positive for abdominal pain.       Gerd  Genitourinary:  Positive for vaginal discharge.       Vulvar swelling  Neurological:  Positive for headaches (occ). Negative for light-headedness.       Objective:   Vitals:   03/14/22 1055  BP: 108/68  Pulse: 82  Temp: 98.2 F (36.8 C)  SpO2: 95%   BP Readings from Last 3 Encounters:  03/14/22 108/68  01/19/22 (!) 141/73  12/23/21 120/70   Wt Readings from Last 3 Encounters:  03/14/22 167 lb (75.8 kg)  01/19/22 170 lb (77.1 kg)  12/23/21 170 lb (77.1 kg)   Body mass index is 30.54 kg/m.    Physical Exam Constitutional:      General: She is not in acute distress.    Appearance: Normal appearance.  HENT:     Head: Normocephalic and atraumatic.  Eyes:     Conjunctiva/sclera: Conjunctivae normal.  Neck:     Vascular: No carotid bruit.  Cardiovascular:     Rate and Rhythm: Normal rate and regular rhythm.     Heart sounds: Normal heart sounds. No murmur heard. Pulmonary:     Effort: Pulmonary effort is normal. No respiratory distress.     Breath sounds: Normal breath sounds. No wheezing.  Musculoskeletal:     Cervical back: Neck supple. Tenderness (right neck - ? adenopathy) present.     Right lower leg: No edema.     Left lower leg: No edema.  Skin:    General: Skin is warm and dry.     Findings: No rash.  Neurological:     Mental Status: She is alert. Mental status is at baseline.  Psychiatric:        Mood and Affect: Mood normal.        Behavior: Behavior normal.        Lab Results  Component Value Date   WBC 7.2  08/09/2021   HGB 14.5 08/09/2021   HCT 43.9 08/09/2021   PLT 225 08/09/2021   GLUCOSE 106 (H) 09/09/2021   CHOL 176 09/09/2021   TRIG 107.0 09/09/2021   HDL 71.80 09/09/2021   LDLDIRECT 113.0 07/21/2017   LDLCALC 83 09/09/2021  ALT 21 09/09/2021   AST 26 09/09/2021   NA 141 09/09/2021   K 3.9 09/09/2021   CL 101 09/09/2021   CREATININE 0.80 09/09/2021   BUN 17 09/09/2021   CO2 33 (H) 09/09/2021   TSH 1.39 02/06/2019   INR 1.00 01/03/2017   HGBA1C 7.1 (H) 09/09/2021   MICROALBUR 2.7 (H) 09/09/2021     Assessment & Plan:    See Problem List for Assessment and Plan of chronic medical problems.

## 2022-03-13 NOTE — Patient Instructions (Addendum)
    Flu vaccine given today.    Blood work was ordered.     Medications changes include :   fluconazole 150 mg every 3 days x 2 doses.  Anti-yeast cream  - apply into vagina nightly for 3 nights as needed for yeast infections.  Start famotidine 40 mg once daily to help control your heartburn   Your prescription(s) have been sent to your pharmacy.      Return in about 6 months (around 09/12/2022) for Physical Exam.

## 2022-03-14 ENCOUNTER — Telehealth: Payer: Self-pay | Admitting: Internal Medicine

## 2022-03-14 ENCOUNTER — Ambulatory Visit (INDEPENDENT_AMBULATORY_CARE_PROVIDER_SITE_OTHER): Payer: HMO | Admitting: Internal Medicine

## 2022-03-14 VITALS — BP 108/68 | HR 82 | Temp 98.2°F | Ht 62.0 in | Wt 167.0 lb

## 2022-03-14 DIAGNOSIS — I2510 Atherosclerotic heart disease of native coronary artery without angina pectoris: Secondary | ICD-10-CM

## 2022-03-14 DIAGNOSIS — E785 Hyperlipidemia, unspecified: Secondary | ICD-10-CM | POA: Diagnosis not present

## 2022-03-14 DIAGNOSIS — Z23 Encounter for immunization: Secondary | ICD-10-CM | POA: Diagnosis not present

## 2022-03-14 DIAGNOSIS — R300 Dysuria: Secondary | ICD-10-CM | POA: Diagnosis not present

## 2022-03-14 DIAGNOSIS — E876 Hypokalemia: Secondary | ICD-10-CM | POA: Diagnosis not present

## 2022-03-14 DIAGNOSIS — E1142 Type 2 diabetes mellitus with diabetic polyneuropathy: Secondary | ICD-10-CM

## 2022-03-14 DIAGNOSIS — F418 Other specified anxiety disorders: Secondary | ICD-10-CM

## 2022-03-14 DIAGNOSIS — N898 Other specified noninflammatory disorders of vagina: Secondary | ICD-10-CM

## 2022-03-14 DIAGNOSIS — K219 Gastro-esophageal reflux disease without esophagitis: Secondary | ICD-10-CM

## 2022-03-14 LAB — MICROALBUMIN / CREATININE URINE RATIO
Creatinine,U: 91.8 mg/dL
Microalb Creat Ratio: 3.6 mg/g (ref 0.0–30.0)
Microalb, Ur: 3.3 mg/dL — ABNORMAL HIGH (ref 0.0–1.9)

## 2022-03-14 LAB — COMPREHENSIVE METABOLIC PANEL
ALT: 17 U/L (ref 0–35)
AST: 22 U/L (ref 0–37)
Albumin: 4.2 g/dL (ref 3.5–5.2)
Alkaline Phosphatase: 88 U/L (ref 39–117)
BUN: 14 mg/dL (ref 6–23)
CO2: 29 mEq/L (ref 19–32)
Calcium: 10.5 mg/dL (ref 8.4–10.5)
Chloride: 100 mEq/L (ref 96–112)
Creatinine, Ser: 0.82 mg/dL (ref 0.40–1.20)
GFR: 68.12 mL/min (ref >60.00)
Glucose, Bld: 81 mg/dL (ref 70–99)
Potassium: 3.8 mEq/L (ref 3.5–5.1)
Sodium: 140 mEq/L (ref 135–145)
Total Bilirubin: 0.3 mg/dL (ref 0.2–1.2)
Total Protein: 8 g/dL (ref 6.0–8.3)

## 2022-03-14 LAB — CBC WITH DIFFERENTIAL/PLATELET
Basophils Absolute: 0 10*3/uL (ref 0.0–0.1)
Basophils Relative: 0.5 % (ref 0.0–3.0)
Eosinophils Absolute: 0.1 10*3/uL (ref 0.0–0.7)
Eosinophils Relative: 1.5 % (ref 0.0–5.0)
HCT: 43.3 % (ref 36.0–46.0)
Hemoglobin: 14.4 g/dL (ref 12.0–15.0)
Lymphocytes Relative: 34.9 % (ref 12.0–46.0)
Lymphs Abs: 2.4 10*3/uL (ref 0.7–4.0)
MCHC: 33.2 g/dL (ref 30.0–36.0)
MCV: 91.7 fl (ref 78.0–100.0)
Monocytes Absolute: 0.6 10*3/uL (ref 0.1–1.0)
Monocytes Relative: 8.4 % (ref 3.0–12.0)
Neutro Abs: 3.8 10*3/uL (ref 1.4–7.7)
Neutrophils Relative %: 54.7 % (ref 43.0–77.0)
Platelets: 234 10*3/uL (ref 150.0–400.0)
RBC: 4.73 Mil/uL (ref 3.87–5.11)
RDW: 13.4 % (ref 11.5–15.5)
WBC: 6.9 10*3/uL (ref 4.0–10.5)

## 2022-03-14 LAB — LIPID PANEL
Cholesterol: 184 mg/dL (ref 0–200)
HDL: 58.3 mg/dL (ref >39.00)
LDL Cholesterol: 92 mg/dL (ref 0–99)
NonHDL: 125.48
Total CHOL/HDL Ratio: 3
Triglycerides: 168 mg/dL — ABNORMAL HIGH (ref 0.0–149.0)
VLDL: 33.6 mg/dL (ref 0.0–40.0)

## 2022-03-14 LAB — URINALYSIS, ROUTINE W REFLEX MICROSCOPIC
Bilirubin Urine: NEGATIVE
Hgb urine dipstick: NEGATIVE
Ketones, ur: NEGATIVE
Leukocytes,Ua: NEGATIVE
Nitrite: NEGATIVE
RBC / HPF: NONE SEEN (ref 0–?)
Specific Gravity, Urine: 1.02 (ref 1.000–1.030)
Total Protein, Urine: NEGATIVE
Urine Glucose: >=1000 — AB
Urobilinogen, UA: 0.2 (ref 0.0–1.0)
pH: 5.5 (ref 5.0–8.0)

## 2022-03-14 LAB — HEMOGLOBIN A1C: Hgb A1c MFr Bld: 6.8 % — ABNORMAL HIGH (ref 4.6–6.5)

## 2022-03-14 MED ORDER — TERCONAZOLE 0.8 % VA CREA: 1.0000 | TOPICAL_CREAM | Freq: Every day | VAGINAL | 2 refills | Status: DC

## 2022-03-14 MED ORDER — FLUCONAZOLE 150 MG PO TABS: 150.0000 mg | ORAL_TABLET | ORAL | 0 refills | Status: DC

## 2022-03-14 MED ORDER — FAMOTIDINE 40 MG PO TABS: 40.0000 mg | ORAL_TABLET | Freq: Every day | ORAL | 5 refills | Status: DC

## 2022-03-14 NOTE — Assessment & Plan Note (Signed)
Chronic GERD not ideally controlled and having some dysphagia Continue omeprazole 40 mg daily Start Pepcid 40 mg daily in the evening or at night Discussed if there is no improvement or if her symptoms worsen she needs to see Dr. Fuller Plan

## 2022-03-14 NOTE — Assessment & Plan Note (Signed)
Chronic Regular exercise and healthy diet encouraged Check lipid panel  Continue Zetia 10 mg daily, Crestor 5 mg daily 

## 2022-03-14 NOTE — Assessment & Plan Note (Signed)
Chronic GERD controlled Continue omeprazole 40 mg daily 

## 2022-03-14 NOTE — Addendum Note (Signed)
Addended by: Marcina Millard on: 03/14/2022 01:28 PM   Modules accepted: Orders

## 2022-03-14 NOTE — Assessment & Plan Note (Signed)
Chronic Continue Lyrica 100 mg 3 times daily

## 2022-03-14 NOTE — Assessment & Plan Note (Signed)
Chronic Controlled, Stable Continue clonazepam 0.25 mg-0.5 mg twice daily as needed

## 2022-03-14 NOTE — Telephone Encounter (Signed)
Patient has questions about the diflucan

## 2022-03-14 NOTE — Assessment & Plan Note (Signed)
Chronic CMP Continue potassium 20 mEq 3 times daily

## 2022-03-14 NOTE — Assessment & Plan Note (Signed)
Chronic  Lab Results  Component Value Date   HGBA1C 7.1 (H) 09/09/2021   Sugars fairly controlled Testing sugars 1 times a day Check A1c Continue Jardiance 25 mg daily, Ozempic 1 mg weekly Stressed regular exercise, diabetic diet

## 2022-03-14 NOTE — Assessment & Plan Note (Signed)
Acute Probable yeast infection-likely secondary to Jardiance Diflucan 150 mg every 72 hours x2 doses Care terconazole 0.8% cream nightly x3 nights-use as needed She would like to continue the Jardiance for now-if were not able to control yeast infections may need to consider changing to something different

## 2022-03-14 NOTE — Assessment & Plan Note (Signed)
Chronic Following with cardiology No symptoms consistent with angina Continue aspirin 81 mg daily, Zetia 10 mg daily, Crestor 5 mg daily and Jardiance 25 mg daily Blood pressure well controlled

## 2022-03-16 LAB — URINE CULTURE

## 2022-03-28 ENCOUNTER — Other Ambulatory Visit: Payer: Self-pay | Admitting: Internal Medicine

## 2022-04-20 ENCOUNTER — Telehealth: Payer: Self-pay

## 2022-04-20 NOTE — Telephone Encounter (Signed)
Mailed Novo Nordisk renewal application to patient home. 

## 2022-04-27 ENCOUNTER — Other Ambulatory Visit: Payer: Self-pay | Admitting: Internal Medicine

## 2022-04-29 NOTE — Progress Notes (Unsigned)
Subjective:   [name] is a 81 y.o. female who presents for Medicare Annual (Subsequent) preventive examination. I connected with  [name] on 05/02/22 by a audio enabled telemedicine application and verified that I am speaking with the correct person using two identifiers.  Patient Location: Home  Provider Location: Home Office  I discussed the limitations of evaluation and management by telemedicine. The patient expressed understanding and agreed to proceed.  Review of Systems    Deferred to PCP Cardiac Risk Factors include: diabetes mellitus;advanced age (>50mn, >>30women);dyslipidemia;hypertension     Objective:    Today's Vitals   05/02/22 1409  PainSc: 6    There is no height or weight on file to calculate BMI.     05/02/2022    2:19 PM 08/09/2021   11:21 AM 04/28/2021    4:12 PM 01/06/2020    2:30 AM 01/22/2019    4:57 PM 11/05/2018    1:37 PM 05/26/2018    7:00 PM  Advanced Directives  Does Patient Have a Medical Advance Directive? Yes Yes No Yes No Yes No  Type of [place]   Living will;Healthcare Power of [place]   Does patient want to make changes to medical advance directive?    No - Patient declined     Copy of [place] Chart? No - copy requested   No - copy requested  No - copy requested   Would patient like information on creating a medical advance directive? No - Patient declined  No - Patient declined  Yes (MAU/Ambulatory/Procedural Areas - Information given)  Yes (Inpatient - patient requests chaplain consult to create a medical advance directive)    Current Medications (verified) Outpatient Encounter Medications as of 05/02/2022  Medication Sig   acetaminophen (TYLENOL) 500 MG tablet Take 500 mg by mouth every 6 (six) hours as needed for headache (pain).   albuterol (VENTOLIN HFA) 108 (90 Base) MCG/ACT inhaler Inhale  2 puffs into the lungs every 6 (six) hours as needed for wheezing or shortness of breath.   aspirin EC 81 MG tablet Take 1 tablet (81 mg total) by mouth daily.   clonazePAM (KLONOPIN) 0.5 MG tablet TAKE 1/2 TO 1 TABLET TWICE A DAY AS NEEDED FOR ANXIETY   Continuous Blood Gluc Receiver (FREESTYLE LIBRE READER) DEVI UAD to check sugars E11.9   Continuous Blood Gluc Sensor (FREESTYLE LIBRE 2 SENSOR) MISC Apply new sensor every 14 days to monitor blood glucose. Remove old sensor before applying new one.   dicyclomine (BENTYL) 10 MG capsule Take 1 capsule (10 mg total) by mouth 3 (three) times daily before meals.   ezetimibe (ZETIA) 10 MG tablet TAKE ONE TABLET BY MOUTH ONCE DAILY   famotidine (PEPCID) 40 MG tablet Take 1 tablet (40 mg total) by mouth daily.   fluticasone (FLONASE) 50 MCG/ACT nasal spray Place 2 sprays into both nostrils in the morning and at bedtime.   glucose blood (ONETOUCH ULTRA) test strip USE UP TO 4 TIMES DAILY AS DIRECTED   ibuprofen (ADVIL) 600 MG tablet Take 1 tablet (600 mg total) by mouth every 6 (six) hours as needed (alternate with acetaminophen).   JARDIANCE 25 MG TABS tablet TAKE ONE TABLET BY MOUTH DAILY BEFORE BREAKFAST   Lancets MISC Use to check blood sugars up to 4 times daily as directed. E11.9   lidocaine (LIDODERM) 5 % Place 1 patch onto the skin daily  as needed. Remove & Discard patch within 12 hours or as directed by MD   lubiprostone ([name]) 24 MCG capsule TAKE ONE CAPSULE BY MOUTH TWICE DAILY WITH A MEAL   meclizine (ANTIVERT) 12.5 MG tablet TAKE 1 TABLET BY MOUTH THREE TIMES A DAY AS NEEDED FOR DIZZINESS   Multiple Vitamin (MULTIVITAMIN WITH MINERALS) TABS tablet Take 1 tablet by mouth daily after lunch.   nitroGLYCERIN (NITROSTAT) 0.4 MG SL tablet Place 1 tablet (0.4 mg total) under the tongue every 5 (five) minutes as needed for chest pain.   omeprazole (PRILOSEC) 40 MG capsule TAKE ONE CAPSULE BY MOUTH ONCE DAILY   OVER THE COUNTER MEDICATION Place 1  drop into both eyes 2 (two) times daily as needed (dry eyes/ pataday). Over the counter eye drop for itching   OXcarbazepine (TRILEPTAL) 150 MG tablet TAKE ONE TABLET BY MOUTH TWICE DAILY   OZEMPIC, 1 MG/DOSE, 4 MG/3ML SOPN INJECT ONE MG AS DIRECTED ONCE A WEEK   Polyvinyl Alcohol-Povidone (REFRESH OP) Place 1 drop into both eyes daily as needed (dry eyes).   potassium chloride SA (KLOR-CON M) 20 MEQ tablet TAKE ONE TABLET BY MOUTH THREE TIMES DAILY   pregabalin (LYRICA) 100 MG capsule TAKE ONE CAPSULE BY MOUTH THREE TIMES DAILY   Probiotic Product (PROBIOTIC PO) Take 1 capsule by mouth daily after breakfast.   rosuvastatin (CRESTOR) 5 MG tablet TAKE ONE TABLET BY MOUTH EVERYDAY AT BEDTIME   terconazole (TERAZOL 3) 0.8 % vaginal cream Place 1 applicator vaginally at bedtime. Use for 3 nights as needed   fluconazole (DIFLUCAN) 150 MG tablet Take 1 tablet (150 mg total) by mouth every 3 (three) days. (Patient not taking: Reported on 05/02/2022)   [DISCONTINUED] Calcium Carbonate (CALCIUM 500 PO) Take 1 capsule by mouth every other day.    No facility-administered encounter medications on file as of 05/02/2022.    Allergies (verified) Cymbalta [duloxetine hcl], Effexor [venlafaxine], Linzess [linaclotide], Other, Statins, and Penicillins   History: Past Medical History:  Diagnosis Date   Abdominal distension, gaseous 04/10/2017   Allergic rhinitis 03/13/2013   CAD (coronary artery disease) 05/28/2018   Cerebrovascular disease    Chest pain 10/12/2016   Degeneration of lumbar intervertebral disc 08/02/2017   Diabetic neuropathy 05/12/2014   Did not tolerated cymbalta - hives, itching Tried nortriptyline - not effective Did not tolerate Effexor-nausea gabapentin-not effective    Diverticulitis of large intestine 05/12/2014   Dyslipidemia    Gastroesophageal reflux disease 03/18/2009   Hearing loss 02/01/2017   Hypercalcemia 10/12/2016   Hypertension    Hypokalemia 01/06/2020   Insomnia 03/20/2009    Irritable bowel syndrome 03/20/2009   With chronic constipation   Laryngopharyngeal reflux (LPR) 09/27/2016   Lumbar pain 08/23/2016   Mild neurocognitive disorder due to multiple etiologies 11/06/2020   Myofascial pain 05/19/2015   Neck pain 09/27/2016   Non-toxic multinodular goiter 05/18/2009   Obstructive sleep apnea 03/18/2009   Not using CPAP   Osteoarthritis of left knee 01/12/2017   Osteoarthritis of shoulder region    Pain in joint of right shoulder 01/21/2019   Presbycusis of both ears 09/27/2016   Seizure disorder    Situational anxiety    Spondylolisthesis 09/20/2017   Steatosis of liver 05/12/2014   Type 2 diabetes mellitus 05/12/2014   Unstable angina 01/05/2020   Vertigo 04/13/2009   Past Surgical History:  Procedure Laterality Date   ABDOMINAL HYSTERECTOMY  1970's   Partial   APPENDECTOMY     CHOLECYSTECTOMY  LEFT HEART CATH AND CORONARY ANGIOGRAPHY N/A 05/28/2018   Procedure: LEFT HEART CATH AND CORONARY ANGIOGRAPHY;  Surgeon: Belva Crome, MD;  Location: [facility];  Service: Cardiovascular;  Laterality: N/A;   LEFT HEART CATH AND CORONARY ANGIOGRAPHY N/A 01/06/2020   Procedure: LEFT HEART CATH AND CORONARY ANGIOGRAPHY;  Surgeon: Belva Crome, MD;  Location: [facility];  Service: Cardiovascular;  Laterality: N/A;   LUMBAR EPIDURAL INJECTION Left 08/25/2017   SHOULDER SURGERY  2008   LT, post fall    TONSILLECTOMY AND ADENOIDECTOMY     TOTAL KNEE ARTHROPLASTY Left 01/12/2017   Procedure: LEFT TOTAL KNEE ARTHROPLASTY;  Surgeon: Susa Day, MD;  Location: [facility];  Service: Orthopedics;  Laterality: Left;  120 mins   Family History  Problem Relation Age of Onset   Arthritis Mother    Heart disease Father    Arthritis Other        Grandmother   Diabetes Other        Grandmother   Colon cancer Neg Hx    Stomach cancer Neg Hx    Pancreatic cancer Neg Hx    Social History   Socioeconomic History   Marital status: Widowed    Spouse name:  Not on file   Number of children: 2   Years of education: 16   Highest education level: Bachelor's degree (e.g., BA, AB, BS)  Occupational History   Occupation: Retired    Comment: admin at [employer]  Tobacco Use   Smoking status: Former    Types: Cigarettes    Quit date: 10/19/1985    Years since quitting: 36.5   Smokeless tobacco: Never  Vaping Use   Vaping Use: Never used  Substance and Sexual Activity   Alcohol use: Yes    Alcohol/week: 0.0 standard drinks of alcohol    Comment: rare glass of Ly Wass   Drug use: No   Sexual activity: Never  Other Topics Concern   Not on file  Social History Narrative   Patient lives in a one story home.  Has 2 children.  Retired from [employer].   Social Determinants of Health   Financial Resource Strain: Low Risk  (05/02/2022)   Overall Financial Resource Strain (CARDIA)    Difficulty of Paying Living Expenses: Not very hard  Food Insecurity: No Food Insecurity (05/02/2022)   Hunger Vital Sign    Worried About Running Out of Food in the Last Year: Never true    Ran Out of Food in the Last Year: Never true  Transportation Needs: No Transportation Needs (05/02/2022)   PRAPARE - Hydrologist (Medical): No    Lack of Transportation (Non-Medical): No  Physical Activity: Inactive (05/02/2022)   Exercise Vital Sign    Days of Exercise per Week: 0 days    Minutes of Exercise per Session: 0 min  Stress: No Stress Concern Present (05/02/2022)   [place]    Feeling of Stress : Only a little  Social Connections: Moderately Integrated (05/02/2022)   Social Connection and Isolation Panel [NHANES]    Frequency of Communication with Friends and Family: More than three times a week    Frequency of Social Gatherings with Friends and Family: More than three times a week    Attends Religious Services: More than 4 times per year    Active Member of [employer] or Organizations:  Yes    Attends Archivist Meetings:  More than 4 times per year    Marital Status: Widowed    Tobacco Counseling Counseling given: Not Answered   Clinical Intake:  Pre-visit preparation completed: Yes  Pain : 0-10 Pain Score: 6  Pain Type: Chronic pain Pain Location: Back Pain Descriptors / Indicators: Aching, Discomfort, Constant Pain Relieving Factors: medication  Pain Relieving Factors: medication  Nutritional Status: BMI 25 -29 Overweight Nutritional Risks: None Diabetes: Yes CBG done?: No (phone visit) Did pt. bring in CBG monitor from home?: No (phone visit)  How often do you need to have someone help you when you read instructions, pamphlets, or other written materials from your doctor or pharmacy?: 1 - Never What is the last grade level you completed in school?: college  Diabetic?yes Nutrition Risk Assessment:  Has the patient had any N/V/D within the last 2 months?  No  Does the patient have any non-healing wounds?  No  Has the patient had any unintentional weight loss or weight gain?  No   Diabetes:  Is the patient diabetic?  Yes  If diabetic, was a CBG obtained today?  No , phone visit Did the patient bring in their glucometer from home?  No , phone visit How often do you monitor your CBG's? CGM.   Financial Strains and Diabetes Management:  Are you having any financial strains with the device, your supplies or your medication? Yes .  Does the patient want to be seen by Chronic Care Management for management of their diabetes?  Yes  Would the patient like to be referred to a Nutritionist or for Diabetic Management?  Yes   Diabetic Exams:  Diabetic Eye Exam: Completed patient states every 6 months Dr. Katy Fitch  Diabetic Foot Exam: Overdue, Pt has been advised about the importance in completing this exam. Pt is scheduled for diabetic foot exam on deferred to PCP.   Interpreter Needed?: No  Information entered by :: Emelia Loron RN   Activities  of Daily Living    05/02/2022    2:16 PM  In your present state of health, do you have any difficulty performing the following activities:  Hearing? 1  Comment reports hearing loss but cannot afford hearing aids  Vision? 0  Difficulty concentrating or making decisions? 0  Walking or climbing stairs? 0  Dressing or bathing? 0  Doing errands, shopping? 0  Preparing Food and eating ? N  Using the Toilet? N  In the past six months, have you accidently leaked urine? N  Do you have problems with loss of bowel control? N  Managing your Medications? N  Managing your Finances? N  Housekeeping or managing your Housekeeping? N    Patient Care Team: Binnie Rail, MD as PCP - General (Internal Medicine) Belva Crome, MD as PCP - Cardiology (Cardiology) Susa Day, MD (Orthopedic Surgery) Izora Gala, MD Blanch Media, MD (Neurology) Satira Sark, MD (Cardiology) Chesley Mires, MD (Pulmonary Disease) Erline Levine, MD (Neurosurgery) Druscilla Brownie, MD (Dermatology) Katy Fitch, Darlina Guys, MD as Consulting Physician (Ophthalmology) Delice Bison, Darnelle Maffucci, [facility] (Inactive) (Pharmacist)  Indicate any recent Medical Services you may have received from other than [facility] providers in the past year (date may be approximate).     Assessment:   This is a routine wellness examination for [name].  Hearing/Vision screen No results found.  Dietary issues and exercise activities discussed: Current Exercise Habits: The patient does not participate in regular exercise at present, Exercise limited by: orthopedic condition(s)   Goals Addressed  This Visit's Progress    Patient Stated       Maintain current health status.      Depression Screen    05/02/2022    2:35 PM 03/14/2022    1:25 PM 03/14/2022   11:05 AM 08/13/2021   10:09 AM 08/13/2021   10:08 AM 08/13/2021   10:07 AM 04/28/2021    3:52 PM  PHQ 2/9 Scores  PHQ - 2 Score 0 0 0 0 1 1 0  PHQ- 9 Score     2       Fall Risk    05/02/2022    2:20 PM 03/14/2022   11:01 AM 08/13/2021   10:07 AM 04/28/2021    4:14 PM 08/31/2020   11:34 AM  Fall Risk   Falls in the past year? 0 0 0 0 0  Number falls in past yr: 0 0 0 0 0  Injury with Fall? 0 0 0 0 0  Risk for fall due to : Impaired balance/gait;Impaired mobility;Impaired vision;History of fall(s) No Fall Risks No Fall Risks No Fall Risks No Fall Risks  Follow up Falls evaluation completed Falls evaluation completed Falls evaluation completed Falls evaluation completed Falls evaluation completed    [place]:  Any stairs in or around the home? Yes  If so, are there any without handrails? Yes  Home free of loose throw rugs in walkways, pet beds, electrical cords, etc? Yes  Adequate lighting in your home to reduce risk of falls? Yes   ASSISTIVE DEVICES UTILIZED TO PREVENT FALLS:  Life alert? No  Use of a cane, walker or w/c? Yes  Grab bars in the bathroom? Yes  Shower chair or bench in shower? Yes  Elevated toilet seat or a handicapped toilet? No   Cognitive Function:    11/05/2018   11:56 AM 10/11/2017    4:51 PM  MMSE - Mini Mental State Exam  Orientation to time 5 5  Orientation to Place 5 5  Registration 3 3  Attention/ Calculation 4 5  Recall 3 2  Language- name 2 objects 2 2  Language- repeat 1 1  Language- follow 3 step command 3 3  Language- read & follow direction 1 1  Write a sentence 1 1  Copy design 1 1  Total score 29 29        05/02/2022    2:21 PM  6CIT Screen  What Year? 0 points  What month? 0 points  What time? 0 points  Count back from 20 0 points  Months in reverse 0 points  Repeat phrase 0 points  Total Score 0 points    Immunizations Immunization History  Administered Date(s) Administered   Fluad Quad(high Dose 65+) 03/20/2019, 03/14/2022   Influenza Split 03/28/2011, 02/26/2012, 03/20/2013   Influenza Whole 02/25/2009, 04/01/2010   Influenza, High Dose Seasonal  PF 03/03/2017, 05/28/2018, 03/12/2020   Influenza-Unspecified 09/05/2014, 03/27/2016, 02/25/2017, 03/05/2021   PFIZER Comirnaty(Gray Top)Covid-19 Tri-Sucrose Vaccine 11/15/2020   PFIZER(Purple Top)SARS-COV-2 Vaccination 07/16/2019, 08/06/2019, 03/23/2020   Pneumococcal Conjugate-13 09/14/2015   Pneumococcal Polysaccharide-23 06/28/2007, 10/11/2017   Tetanus 05/02/2012   Unspecified SARS-COV-2 Vaccination 04/26/2022   Zoster Recombinat (Shingrix) 01/27/2020, 05/19/2020    TDAP status: Up to date  Flu Vaccine status: Up to date  Pneumococcal vaccine status: Up to date  Covid-19 vaccine status: Completed vaccines  Qualifies for Shingles Vaccine? Yes   Zostavax completed No   Shingrix Completed?: Yes  Screening Tests Health Maintenance  Topic Date  Due   OPHTHALMOLOGY EXAM  11/01/2019   FOOT EXAM  02/25/2022   TETANUS/TDAP  05/02/2022   COVID-19 Vaccine (6 - Pfizer series) 06/21/2022   HEMOGLOBIN A1C  09/12/2022   Diabetic kidney evaluation - GFR measurement  03/15/2023   Diabetic kidney evaluation - Urine ACR  03/15/2023   Medicare Annual Wellness (AWV)  05/03/2023   Pneumonia Vaccine 38+ Years old  Completed   INFLUENZA VACCINE  Completed   DEXA SCAN  Completed   Hepatitis C Screening  Completed   Zoster Vaccines- Shingrix  Completed   HPV VACCINES  Aged Out   COLONOSCOPY (Pts 45-7yr Insurance coverage will need to be confirmed)  Discontinued    Health Maintenance  Health Maintenance Due  Topic Date Due   OPHTHALMOLOGY EXAM  11/01/2019   FOOT EXAM  02/25/2022   TETANUS/TDAP  05/02/2022    Colorectal cancer screening: Type of screening: Colonoscopy. Completed 01/19/22. Repeat every never years  Mammogram status: No longer required due to age.  Bone Density status: Completed 01/17/18. Results reflect: Bone density results: OSTEOPENIA. Repeat every 2 years.  Lung Cancer Screening: (Low Dose CT Chest recommended if Age 79-80years, 30 pack-year currently smoking OR  have quit w/in 15years.) does not qualify.   Additional Screening:  Hepatitis C Screening: does qualify; Completed 09/09/21  Vision Screening: Recommended annual ophthalmology exams for early detection of glaucoma and other disorders of the eye. Is the patient up to date with their annual eye exam?  Yes  Who is the provider or what is the name of the office in which the patient attends annual eye exams? Dr. GKaty FitchIf pt is not established with a provider, would they like to be referred to a provider to establish care?  N/A .   Dental Screening: Recommended annual dental exams for proper oral hygiene  Community Resource Referral / Chronic Care Management: CRR required this visit?  No   CCM required this visit?  No      Plan:     I have personally reviewed and noted the following in the patient's chart:   Medical and social history Use of alcohol, tobacco or illicit drugs  Current medications and supplements including opioid prescriptions. Patient is not currently taking opioid prescriptions. Functional ability and status Nutritional status Physical activity Advanced directives List of other physicians Hospitalizations, surgeries, and ER visits in previous 12 months Vitals Screenings to include cognitive, depression, and falls Referrals and appointments  In addition, I have reviewed and discussed with patient certain preventive protocols, quality metrics, and best practice recommendations. A written personalized care plan for preventive services as well as general preventive health recommendations were provided to patient.     JMichiel Cowboy RN   05/02/2022   Nurse Notes:  Ms. CSheard, Thank you for taking time to come for your Medicare Wellness Visit. I appreciate your ongoing commitment to your health goals. Please review the following plan we discussed and let me know if I can assist you in the future.   These are the goals we discussed:  Goals       Patient Stated      Patient Stated (pt-stated)      My goal is to continue to work on my HgA1C and arthritis.  By watching my sugar intake and doing exercises to eliminate the arthritis pain.      Other     Manage My Medicine      Timeframe:  Long-Range Goal Priority:  Medium  Start Date:    07/28/2021                         Expected End Date:    07/28/2022                   Follow Up Date 10/25/2021   - call for medicine refill 2 or 3 days before it runs out - call if I am sick and can't take my medicine - keep a list of all the medicines I take; vitamins and herbals too - learn to read medicine labels - use a pillbox to sort medicine    Why is this important?   These steps will help you keep on track with your medicines.   Notes:       Patient Stated      Maintain current health status.      Pharmacy Care Plan      CARE PLAN ENTRY (see longitudinal plan of care for additional care plan information)  Current Barriers:  Chronic Disease Management support, education, and care coordination needs related to Hypertension, Hyperlipidemia, Diabetes, and Coronary Artery Disease   Hypertension BP Readings from Last 3 Encounters:  02/20/20 110/60  01/23/20 110/68  01/06/20 (!) 109/55  Pharmacist Clinical Goal(s): Over the next 30 days, patient will work with PharmD and providers to maintain BP goal <130/80 Current regimen:  No medications Interventions: Discussed BP goals and benefits of medications for prevention of heart attack / stroke Patient self care activities - Over the next 30 days, patient will: Check BP as needed, document, and provide at future appointments Ensure daily salt intake < 2300 mg/day  Hyperlipidemia / CAD Lab Results  Component Value Date/Time   LDLCALC 46 08/12/2019 02:12 PM   LDLDIRECT 113.0 07/21/2017 04:43 PM  Pharmacist Clinical Goal(s): Over the next 30 days, patient will work with PharmD and providers to maintain LDL goal < 70 Current regimen:  rosuvastatin 5 mg  daily ezetimibe 10 mg daily aspirin 81 mg daily nitroglycerin 0.4 mg as needed Interventions: Discussed cholesterol goals and benefits of medications for prevention of heart attack / stroke Patient self care activities - Over the next 30 days, patient will: Continue current medications  Diabetes Lab Results  Component Value Date/Time   HGBA1C 6.3 (H) 01/23/2020 11:11 AM   HGBA1C 6.9 (H) 08/12/2019 02:12 PM  Pharmacist Clinical Goal(s): Over the next 30 days, patient will work with PharmD and providers to maintain A1c goal <7% Current regimen:  Jardiance 10 mg daily (via [place] - delivered to patient) Ozempic 0.5 mg weekly (via [employer] - delivered to office) Interventions: Discussed blood sugar goals and benefits of medications for prevention of diabetic complications Patient self care activities - Over the next 30 days, patient will: Check blood sugar twice daily, document, and provide at future appointments Contact provider with any episodes of hypoglycemia Contact [facility] for refills of Jardiance.  Contact office for refills of Ozempic via [employer]  Medication management Pharmacist Clinical Goal(s): Over the next 30 days, patient will work with PharmD and providers to achieve optimal medication adherence Current pharmacy: [facility] Interventions Comprehensive medication review performed. Utilize [facility] pharmacy for medication synchronization, packaging and delivery Renew PAP for Ozempic, Jardiance, [place] for 2022 Patient self care activities - Over the next 30 days, patient will: Focus on medication adherence by pill packs Take medications as prescribed Report any questions or concerns to PharmD and/or provider(s)  Please see  past updates related to this goal by clicking on the "Past Updates" button in the selected goal         This is a list of the screening recommended for you and due dates:  Health Maintenance  Topic Date Due   Eye exam for diabetics   11/01/2019   Complete foot exam   02/25/2022   Tetanus Vaccine  05/02/2022   COVID-19 Vaccine (6 - Pfizer series) 06/21/2022   Hemoglobin A1C  09/12/2022   Yearly kidney function blood test for diabetes  03/15/2023   Yearly kidney health urinalysis for diabetes  03/15/2023   Medicare Annual Wellness Visit  05/03/2023   Pneumonia Vaccine  Completed   Flu Shot  Completed   DEXA scan (bone density measurement)  Completed   Hepatitis C Screening: USPSTF Recommendation to screen - Ages 58-79 yo.  Completed   Zoster (Shingles) Vaccine  Completed   HPV Vaccine  Aged Out   Colon Cancer Screening  Discontinued

## 2022-04-29 NOTE — Patient Instructions (Signed)

## 2022-05-02 ENCOUNTER — Ambulatory Visit (INDEPENDENT_AMBULATORY_CARE_PROVIDER_SITE_OTHER): Payer: HMO | Admitting: *Deleted

## 2022-05-02 DIAGNOSIS — Z0000 Encounter for general adult medical examination without abnormal findings: Secondary | ICD-10-CM

## 2022-05-04 ENCOUNTER — Other Ambulatory Visit: Payer: Self-pay | Admitting: Internal Medicine

## 2022-05-04 DIAGNOSIS — K5900 Constipation, unspecified: Secondary | ICD-10-CM

## 2022-05-09 ENCOUNTER — Other Ambulatory Visit: Payer: Self-pay | Admitting: Internal Medicine

## 2022-05-26 ENCOUNTER — Telehealth: Payer: Self-pay

## 2022-05-26 NOTE — Telephone Encounter (Signed)
Spoke with patient today and patient assistance placed in fridge.

## 2022-06-03 ENCOUNTER — Other Ambulatory Visit: Payer: Self-pay | Admitting: Internal Medicine

## 2022-06-03 DIAGNOSIS — I2510 Atherosclerotic heart disease of native coronary artery without angina pectoris: Secondary | ICD-10-CM

## 2022-07-11 ENCOUNTER — Other Ambulatory Visit: Payer: Self-pay | Admitting: Internal Medicine

## 2022-07-26 ENCOUNTER — Other Ambulatory Visit: Payer: Self-pay | Admitting: Internal Medicine

## 2022-08-03 NOTE — Progress Notes (Unsigned)
Subjective:    Patient ID: [name], female    DOB: 08-04-1940, 82 y.o.   MRN: [id number]     HPI Lateefa is here for follow up of her chronic medical problems, including DM, hld, GERD, diabetic neuropathy, CAD, anxiety, hypokalemia, constipation, OSA, seizure disorder  Occasional left chest heaviness, dull pain.  Has taken ntg.  The ntg will make it go away, but it comes back.   She has felt it this morning.  She thinks it is more with activity.  She does have a history of unstable angina.   Neuropathy is killing her - can feel it in the face, arms, in her legs.  Occasionally feels itchy all over.  She is taking her medication as prescribed.  She is not sure if there is anything else that can be done for this.  Having vaginal discharge - yellowish yesterday.  She does have some itching at times.  The medication had given her in the past and does help.  Sugars are well-controlled at home.  She is having frustrations and anxiety regarding not being able to do what she used to be able to do.  She cannot clean the house like she used to [place] like she used to.  Medications and allergies reviewed with patient and updated if appropriate.  Current Outpatient Medications on File Prior to Visit  Medication Sig Dispense Refill   acetaminophen (TYLENOL) 500 MG tablet Take 500 mg by mouth every 6 (six) hours as needed for headache (pain).     albuterol (VENTOLIN HFA) 108 (90 Base) MCG/ACT inhaler Inhale 2 puffs into the lungs every 6 (six) hours as needed for wheezing or shortness of breath.     aspirin EC 81 MG tablet Take 1 tablet (81 mg total) by mouth daily. 90 tablet 3   clonazePAM (KLONOPIN) 0.5 MG tablet TAKE 1/2 TO 1 TABLET TWICE A DAY AS NEEDED FOR ANXIETY 60 tablet 3   Continuous Blood Gluc Receiver (FREESTYLE LIBRE READER) DEVI UAD to check sugars E11.9 1 each 0   Continuous Blood Gluc Sensor (FREESTYLE LIBRE 2 SENSOR) MISC Apply new sensor every 14 days to monitor  blood glucose. Remove old sensor before applying new one. 1 each 11   dicyclomine (BENTYL) 10 MG capsule Take 1 capsule (10 mg total) by mouth 3 (three) times daily before meals. 90 capsule 11   ezetimibe (ZETIA) 10 MG tablet TAKE ONE TABLET BY MOUTH ONCE DAILY 90 tablet 1   famotidine (PEPCID) 40 MG tablet Take 1 tablet (40 mg total) by mouth daily. 30 tablet 5   fluticasone (FLONASE) 50 MCG/ACT nasal spray Place 2 sprays into both nostrils in the morning and at bedtime.     glucose blood (ONETOUCH ULTRA) test strip USE UP TO 4 TIMES DAILY AS DIRECTED 400 strip 3   ibuprofen (ADVIL) 600 MG tablet Take 1 tablet (600 mg total) by mouth every 6 (six) hours as needed (alternate with acetaminophen). 30 tablet 0   JARDIANCE 25 MG TABS tablet TAKE ONE TABLET BY MOUTH DAILY BEFORE BREAKFAST 90 tablet 1   Lancets MISC Use to check blood sugars up to 4 times daily as directed. E11.9 400 each 3   lidocaine (LIDODERM) 5 % Place 1 patch onto the skin daily as needed. Remove & Discard patch within 12 hours or as directed by MD 15 patch 0   lubiprostone ([name]) 24 MCG capsule TAKE ONE CAPSULE BY MOUTH TWICE DAILY WITH A MEAL  180 capsule 0   meclizine (ANTIVERT) 12.5 MG tablet TAKE 1 TABLET BY MOUTH THREE TIMES A DAY AS NEEDED FOR DIZZINESS 30 tablet 0   Multiple Vitamin (MULTIVITAMIN WITH MINERALS) TABS tablet Take 1 tablet by mouth daily after lunch.     nitroGLYCERIN (NITROSTAT) 0.4 MG SL tablet Place 1 tablet (0.4 mg total) under the tongue every 5 (five) minutes as needed for chest pain. 25 tablet 2   omeprazole (PRILOSEC) 40 MG capsule TAKE ONE CAPSULE BY MOUTH ONCE DAILY 90 capsule 1   OVER THE COUNTER MEDICATION Place 1 drop into both eyes 2 (two) times daily as needed (dry eyes/ pataday). Over the counter eye drop for itching     OXcarbazepine (TRILEPTAL) 150 MG tablet TAKE ONE TABLET BY MOUTH TWICE DAILY 180 tablet 1   OZEMPIC, 1 MG/DOSE, 4 MG/3ML SOPN INJECT ONE MG AS DIRECTED ONCE A WEEK 9 mL 1    Polyvinyl Alcohol-Povidone (REFRESH OP) Place 1 drop into both eyes daily as needed (dry eyes).     potassium chloride SA (KLOR-CON M) 20 MEQ tablet TAKE ONE TABLET BY MOUTH THREE TIMES DAILY 270 tablet 1   pregabalin (LYRICA) 100 MG capsule TAKE ONE CAPSULE BY MOUTH THREE TIMES DAILY 90 capsule 1   Probiotic Product (PROBIOTIC PO) Take 1 capsule by mouth daily after breakfast.     rosuvastatin (CRESTOR) 5 MG tablet TAKE ONE TABLET BY MOUTH EVERYDAY AT BEDTIME 90 tablet 4   terconazole (TERAZOL 3) 0.8 % vaginal cream Place 1 applicator vaginally at bedtime. Use for 3 nights as needed 20 g 2   [DISCONTINUED] Calcium Carbonate (CALCIUM 500 PO) Take 1 capsule by mouth every other day.      No current facility-administered medications on file prior to visit.     Review of Systems  Constitutional:  Negative for fever.  Respiratory:  Positive for cough. Negative for shortness of breath and wheezing.   Cardiovascular:  Positive for chest pain. Negative for palpitations and leg swelling.  Genitourinary:  Positive for dysuria (yesterday), frequency and vaginal discharge (yellow in color). Negative for hematuria and vaginal bleeding.       Mild genital itch  Neurological:  Positive for dizziness and headaches.       Objective:   Vitals:   08/04/22 1041  BP: 128/80  Pulse: 63  Temp: 98 F (36.7 C)  SpO2: 100%   BP Readings from Last 3 Encounters:  08/04/22 128/80  03/14/22 108/68  01/19/22 (!) 141/73   Wt Readings from Last 3 Encounters:  08/04/22 166 lb (75.3 kg)  03/14/22 167 lb (75.8 kg)  01/19/22 170 lb (77.1 kg)   Body mass index is 30.36 kg/m.    Physical Exam Constitutional:      General: She is not in acute distress.    Appearance: Normal appearance.  HENT:     Head: Normocephalic and atraumatic.  Eyes:     Conjunctiva/sclera: Conjunctivae normal.  Cardiovascular:     Rate and Rhythm: Normal rate and regular rhythm.     Heart sounds: Normal heart sounds. No murmur  heard. Pulmonary:     Effort: Pulmonary effort is normal. No respiratory distress.     Breath sounds: Normal breath sounds. No wheezing.  Musculoskeletal:     Cervical back: Neck supple.     Right lower leg: No edema.     Left lower leg: No edema.  Lymphadenopathy:     Cervical: No cervical adenopathy.  Skin:    General:  Skin is warm and dry.     Findings: No rash.  Neurological:     Mental Status: She is alert. Mental status is at baseline.  Psychiatric:        Mood and Affect: Mood normal.        Behavior: Behavior normal.        Lab Results  Component Value Date   WBC 6.9 03/14/2022   HGB 14.4 03/14/2022   HCT 43.3 03/14/2022   PLT 234.0 03/14/2022   GLUCOSE 81 03/14/2022   CHOL 184 03/14/2022   TRIG 168.0 (H) 03/14/2022   HDL 58.30 03/14/2022   LDLDIRECT 113.0 07/21/2017   LDLCALC 92 03/14/2022   ALT 17 03/14/2022   AST 22 03/14/2022   NA 140 03/14/2022   K 3.8 03/14/2022   CL 100 03/14/2022   CREATININE 0.82 03/14/2022   BUN 14 03/14/2022   CO2 29 03/14/2022   TSH 1.39 02/06/2019   INR 1.00 01/03/2017   HGBA1C 6.8 (H) 03/14/2022   MICROALBUR 3.3 (H) 03/14/2022     Assessment & Plan:    See Problem List for Assessment and Plan of chronic medical problems.

## 2022-08-03 NOTE — Patient Instructions (Addendum)
      Blood work was ordered.   The lab is on the first floor.    Medications changes include :   fluconazole 150 mg x 1.   Stop jardiance.     A referral was ordered for cardiology.     Someone will call you to schedule an appointment.    Return in about 3 months (around 11/02/2022) for Physical Exam.

## 2022-08-04 ENCOUNTER — Encounter: Payer: Self-pay | Admitting: Internal Medicine

## 2022-08-04 ENCOUNTER — Ambulatory Visit (INDEPENDENT_AMBULATORY_CARE_PROVIDER_SITE_OTHER): Payer: HMO | Admitting: Internal Medicine

## 2022-08-04 VITALS — BP 128/80 | HR 63 | Temp 98.0°F | Ht 62.0 in | Wt 166.0 lb

## 2022-08-04 DIAGNOSIS — E1142 Type 2 diabetes mellitus with diabetic polyneuropathy: Secondary | ICD-10-CM

## 2022-08-04 DIAGNOSIS — K5900 Constipation, unspecified: Secondary | ICD-10-CM

## 2022-08-04 DIAGNOSIS — R079 Chest pain, unspecified: Secondary | ICD-10-CM

## 2022-08-04 DIAGNOSIS — G40909 Epilepsy, unspecified, not intractable, without status epilepticus: Secondary | ICD-10-CM | POA: Diagnosis not present

## 2022-08-04 DIAGNOSIS — R300 Dysuria: Secondary | ICD-10-CM | POA: Diagnosis not present

## 2022-08-04 DIAGNOSIS — F418 Other specified anxiety disorders: Secondary | ICD-10-CM

## 2022-08-04 DIAGNOSIS — E785 Hyperlipidemia, unspecified: Secondary | ICD-10-CM

## 2022-08-04 DIAGNOSIS — N898 Other specified noninflammatory disorders of vagina: Secondary | ICD-10-CM

## 2022-08-04 DIAGNOSIS — G4733 Obstructive sleep apnea (adult) (pediatric): Secondary | ICD-10-CM

## 2022-08-04 DIAGNOSIS — K219 Gastro-esophageal reflux disease without esophagitis: Secondary | ICD-10-CM | POA: Diagnosis not present

## 2022-08-04 DIAGNOSIS — I2510 Atherosclerotic heart disease of native coronary artery without angina pectoris: Secondary | ICD-10-CM | POA: Diagnosis not present

## 2022-08-04 DIAGNOSIS — E876 Hypokalemia: Secondary | ICD-10-CM

## 2022-08-04 DIAGNOSIS — I2089 Other forms of angina pectoris: Secondary | ICD-10-CM

## 2022-08-04 LAB — COMPREHENSIVE METABOLIC PANEL
ALT: 20 U/L (ref 0–35)
AST: 24 U/L (ref 0–37)
Albumin: 4.4 g/dL (ref 3.5–5.2)
Alkaline Phosphatase: 76 U/L (ref 39–117)
BUN: 13 mg/dL (ref 6–23)
CO2: 31 mEq/L (ref 19–32)
Calcium: 10.3 mg/dL (ref 8.4–10.5)
Chloride: 101 mEq/L (ref 96–112)
Creatinine, Ser: 0.8 mg/dL (ref 0.40–1.20)
GFR: 69.98 mL/min (ref >60.00)
Glucose, Bld: 101 mg/dL — ABNORMAL HIGH (ref 70–99)
Potassium: 4.1 mEq/L (ref 3.5–5.1)
Sodium: 140 mEq/L (ref 135–145)
Total Bilirubin: 0.3 mg/dL (ref 0.2–1.2)
Total Protein: 7.7 g/dL (ref 6.0–8.3)

## 2022-08-04 LAB — URINALYSIS, ROUTINE W REFLEX MICROSCOPIC
Bilirubin Urine: NEGATIVE
Hgb urine dipstick: NEGATIVE
Ketones, ur: NEGATIVE
Leukocytes,Ua: NEGATIVE
Nitrite: NEGATIVE
RBC / HPF: NONE SEEN (ref 0–?)
Specific Gravity, Urine: 1.02 (ref 1.000–1.030)
Total Protein, Urine: NEGATIVE
Urine Glucose: >=1000 — AB
Urobilinogen, UA: 0.2 (ref 0.0–1.0)
pH: 5.5 (ref 5.0–8.0)

## 2022-08-04 LAB — CBC WITH DIFFERENTIAL/PLATELET
Basophils Absolute: 0 10*3/uL (ref 0.0–0.1)
Basophils Relative: 0.3 % (ref 0.0–3.0)
Eosinophils Absolute: 0.1 10*3/uL (ref 0.0–0.7)
Eosinophils Relative: 1.2 % (ref 0.0–5.0)
HCT: 44.6 % (ref 36.0–46.0)
Hemoglobin: 14.8 g/dL (ref 12.0–15.0)
Lymphocytes Relative: 40.3 % (ref 12.0–46.0)
Lymphs Abs: 2.7 10*3/uL (ref 0.7–4.0)
MCHC: 33.1 g/dL (ref 30.0–36.0)
MCV: 91.2 fl (ref 78.0–100.0)
Monocytes Absolute: 0.6 10*3/uL (ref 0.1–1.0)
Monocytes Relative: 9.1 % (ref 3.0–12.0)
Neutro Abs: 3.3 10*3/uL (ref 1.4–7.7)
Neutrophils Relative %: 49.1 % (ref 43.0–77.0)
Platelets: 260 10*3/uL (ref 150.0–400.0)
RBC: 4.89 Mil/uL (ref 3.87–5.11)
RDW: 13.9 % (ref 11.5–15.5)
WBC: 6.8 10*3/uL (ref 4.0–10.5)

## 2022-08-04 LAB — LIPID PANEL
Cholesterol: 242 mg/dL — ABNORMAL HIGH (ref 0–200)
HDL: 63.9 mg/dL (ref >39.00)
LDL Cholesterol: 147 mg/dL — ABNORMAL HIGH (ref 0–99)
NonHDL: 177.99
Total CHOL/HDL Ratio: 4
Triglycerides: 157 mg/dL — ABNORMAL HIGH (ref 0.0–149.0)
VLDL: 31.4 mg/dL (ref 0.0–40.0)

## 2022-08-04 LAB — MICROALBUMIN / CREATININE URINE RATIO
Creatinine,U: 84.2 mg/dL
Microalb Creat Ratio: 4.6 mg/g (ref 0.0–30.0)
Microalb, Ur: 3.9 mg/dL — ABNORMAL HIGH (ref 0.0–1.9)

## 2022-08-04 LAB — HEMOGLOBIN A1C: Hgb A1c MFr Bld: 6.8 % — ABNORMAL HIGH (ref 4.6–6.5)

## 2022-08-04 MED ORDER — FLUCONAZOLE 150 MG PO TABS: 150.0000 mg | ORAL_TABLET | Freq: Once | ORAL | 0 refills | Status: AC

## 2022-08-04 NOTE — Assessment & Plan Note (Signed)
Acute Has had issues with yeast infections over the past several months Likely related Jardiance Fluconazole 150 mg p.o. x 1 Stop Jardiance Follow-up in 3 months

## 2022-08-04 NOTE — Assessment & Plan Note (Signed)
Acute Experiencing left-sided chest pain/pressure that seems to be related to activity Concerning for angina Responsive to nitroglycerin Needs to establish with a new cardiologist-was seeing Dr. Tamala Julian Urgent referral ordered

## 2022-08-04 NOTE — Assessment & Plan Note (Signed)
Chronic Regular exercise and healthy diet encouraged Check lipid panel  Continue Zetia 10 mg daily, Crestor 5 mg daily

## 2022-08-04 NOTE — Assessment & Plan Note (Addendum)
Chronic Pain is not ideally controlled Has not tolerated gabapentin, nortriptyline, Cymbalta and Effexor in the past Not sure of increasing the Lyrica dose at this point would help and I do worry about side effects Also on ox carbamazepine for seizures Continue Lyrica 100 mg 3 times daily Stressed maintaining good sugar control

## 2022-08-04 NOTE — Assessment & Plan Note (Addendum)
Chronic Denies any seizure-like activity Controlled Continue oxycarbamazepine 150 mg twice daily Advised follow up with Dr Posey Pronto

## 2022-08-04 NOTE — Assessment & Plan Note (Signed)
Acute Experiencing some dysuria as well eyes vaginal discharge UA, urine culture to rule out UTI

## 2022-08-04 NOTE — Assessment & Plan Note (Signed)
Chronic Using CPAP nightly 

## 2022-08-04 NOTE — Assessment & Plan Note (Signed)
Chronic Controlled, Stable Continue clonazepam 0.25-0.5 mg twice daily as needed

## 2022-08-04 NOTE — Assessment & Plan Note (Signed)
Chronic GERD controlled Continue omeprazole 40 mg daily 

## 2022-08-04 NOTE — Assessment & Plan Note (Addendum)
Chronic Eating prunes, drinking cranberry juice Occasionally takes senokot prn

## 2022-08-04 NOTE — Assessment & Plan Note (Signed)
Chronic CMP Continue potassium 20 mill equivalents 3 times a day

## 2022-08-04 NOTE — Assessment & Plan Note (Addendum)
Chronic   Lab Results  Component Value Date   HGBA1C 6.8 (H) 03/14/2022   Sugars controlled Testing sugars 1 times a day Check A1c  Continue Ozempic 1 mg weekly Will stop Jardiance given possible recurrent yeast infections-will have her follow-up in 3 months to reevaluate her sugars at that time Discussed that her sugars may elevate and we may need to add another medication Stressed regular exercise, diabetic diet

## 2022-08-04 NOTE — Assessment & Plan Note (Addendum)
Chronic Following with cardiology-but has not seen them in a while Having left-sided chest pain/pressure that is responsive to nitroglycerin-concern for unstable angina Continue aspirin 81 mg daily, Crestor 5 mg daily, Zetia 10 mg daily She does need to establish with a new cardiologist as Dr. Tamala Julian has retired-referral ordered since she is having that chest pain should be seen as soon as possible

## 2022-08-05 LAB — URINE CULTURE

## 2022-08-25 ENCOUNTER — Other Ambulatory Visit: Payer: Self-pay

## 2022-08-25 ENCOUNTER — Other Ambulatory Visit: Payer: Self-pay | Admitting: Internal Medicine

## 2022-08-25 DIAGNOSIS — E1142 Type 2 diabetes mellitus with diabetic polyneuropathy: Secondary | ICD-10-CM

## 2022-09-02 ENCOUNTER — Other Ambulatory Visit: Payer: Self-pay

## 2022-09-02 ENCOUNTER — Other Ambulatory Visit: Payer: Self-pay | Admitting: Internal Medicine

## 2022-09-02 DIAGNOSIS — K5900 Constipation, unspecified: Secondary | ICD-10-CM

## 2022-09-15 ENCOUNTER — Other Ambulatory Visit: Payer: Self-pay | Admitting: Internal Medicine

## 2022-10-19 ENCOUNTER — Encounter: Payer: Self-pay | Admitting: Cardiology

## 2022-10-19 ENCOUNTER — Ambulatory Visit: Payer: PPO | Attending: Cardiology | Admitting: Cardiology

## 2022-10-19 VITALS — BP 98/62 | HR 71 | Ht 62.0 in | Wt 165.4 lb

## 2022-10-19 DIAGNOSIS — R079 Chest pain, unspecified: Secondary | ICD-10-CM | POA: Diagnosis not present

## 2022-10-19 DIAGNOSIS — I2510 Atherosclerotic heart disease of native coronary artery without angina pectoris: Secondary | ICD-10-CM

## 2022-10-19 NOTE — Patient Instructions (Signed)
Medication Instructions:  Your physician recommends that you continue on your current medications as directed. Please refer to the Current Medication list given to you today.  *If you need a refill on your cardiac medications before your next appointment, please call your pharmacy*  Follow-Up: At [facility], you and your health needs are our priority.  As part of our continuing mission to provide you with exceptional heart care, we have created designated Provider Care Teams.  These Care Teams include your primary Cardiologist (physician) and Advanced Practice Providers (APPs -  Physician Assistants and Nurse Practitioners) who all work together to provide you with the care you need, when you need it.  We recommend signing up for the patient portal called "MyChart".  Sign up information is provided on this After Visit Summary.  MyChart is used to connect with patients for Virtual Visits (Telemedicine).  Patients are able to view lab/test results, encounter notes, upcoming appointments, etc.  Non-urgent messages can be sent to your provider as well.   To learn more about what you can do with MyChart, go to [email or web address].    Your next appointment:   1 year(s)  Provider:   Dr. Anne Fu

## 2022-10-19 NOTE — Progress Notes (Signed)
Cardiology Office Note:    Date:  10/19/2022   ID:  [name], [name] June 24, 1941, MRN [id number]  PCP:  Pincus Sanes, MD   [facility] Providers Cardiologist:  Donato Schultz, MD     Referring MD: Pincus Sanes, MD    History of Present Illness:    [name] is a 82 y.o. female former patient of Dr. Verdis Prime is here for follow-up of coronary artery disease.  Last seen in 2021, she had coronary catheterization stable.   Cardiac cath with clacified LM, LAD and pLCX.  Patent LM, patent LAD, Large branching ramus intermedius with ostial 75% narrowing in the more anterior subbranch, unchanged from 2019   Tortuous circumflex with ostial 65 to 75% stenosis, unchanged from prior.  The ostial vessel was calcified.  Anterior origin right coronary.  Widely patent without obstructive disease.   Dr. Katrinka Blazing recommended alternative explanations for chest pain with no EKG changes or enzyme elevation. To use aggressive risk factor modification. Echo during that time frame with EF 60-65%, no RWMA, mild LVH, G1DD. PPI continued daily. BB was stopped. BP soft so HCTZ and K+ held at discharge.   Has had some occasional chest discomfort.  Does not appear to be exertional.  She does enjoy walking outside.  Misses her husband [name], widowed.  He was also a patient of Dr. Katrinka Blazing.  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  Past Medical History:  Diagnosis Date   Abdominal distension, gaseous 04/10/2017   Allergic rhinitis 03/13/2013   CAD (coronary artery disease) 05/28/2018   Cerebrovascular disease    Chest pain 10/12/2016   Degeneration of lumbar intervertebral disc 08/02/2017   Diabetic neuropathy 05/12/2014   Did not tolerated cymbalta - hives, itching Tried nortriptyline - not effective Did not tolerate Effexor-nausea gabapentin-not effective    Diverticulitis of large intestine 05/12/2014   Dyslipidemia    Gastroesophageal reflux  disease 03/18/2009   Hearing loss 02/01/2017   Hypercalcemia 10/12/2016   Hypertension    Hypokalemia 01/06/2020   Insomnia 03/20/2009   Irritable bowel syndrome 03/20/2009   With chronic constipation   Laryngopharyngeal reflux (LPR) 09/27/2016   Lumbar pain 08/23/2016   Mild neurocognitive disorder due to multiple etiologies 11/06/2020   Myofascial pain 05/19/2015   Neck pain 09/27/2016   Non-toxic multinodular goiter 05/18/2009   Obstructive sleep apnea 03/18/2009   Not using CPAP   Osteoarthritis of left knee 01/12/2017   Osteoarthritis of shoulder region    Pain in joint of right shoulder 01/21/2019   Presbycusis of both ears 09/27/2016   Seizure disorder    Situational anxiety    Spondylolisthesis 09/20/2017   Steatosis of liver 05/12/2014   Type 2 diabetes mellitus 05/12/2014   Unstable angina 01/05/2020   Vertigo 04/13/2009    Past Surgical History:  Procedure Laterality Date   ABDOMINAL HYSTERECTOMY  1970's   Partial   APPENDECTOMY     CHOLECYSTECTOMY     LEFT HEART CATH AND CORONARY ANGIOGRAPHY N/A 05/28/2018   Procedure: LEFT HEART CATH AND CORONARY ANGIOGRAPHY;  Surgeon: Lyn Records, MD;  Location: [facility];  Service: Cardiovascular;  Laterality: N/A;   LEFT HEART CATH AND CORONARY ANGIOGRAPHY N/A 01/06/2020   Procedure: LEFT HEART CATH AND CORONARY ANGIOGRAPHY;  Surgeon: Lyn Records, MD;  Location: [facility];  Service: Cardiovascular;  Laterality: N/A;   LUMBAR EPIDURAL INJECTION Left 08/25/2017   SHOULDER  SURGERY  2008   LT, post fall    TONSILLECTOMY AND ADENOIDECTOMY     TOTAL KNEE ARTHROPLASTY Left 01/12/2017   Procedure: LEFT TOTAL KNEE ARTHROPLASTY;  Surgeon: Jene Every, MD;  Location: [facility];  Service: Orthopedics;  Laterality: Left;  120 mins    Current Medications: Current Meds  Medication Sig   acetaminophen (TYLENOL) 500 MG tablet Take 500 mg by mouth every 6 (six) hours as needed for headache (pain).   albuterol (VENTOLIN HFA) 108 (90  Base) MCG/ACT inhaler Inhale 2 puffs into the lungs every 6 (six) hours as needed for wheezing or shortness of breath.   aspirin EC 81 MG tablet Take 1 tablet (81 mg total) by mouth daily.   clonazePAM (KLONOPIN) 0.5 MG tablet TAKE 1/2 TO 1 TABLET TWICE A DAY AS NEEDED FOR ANXIETY   Continuous Blood Gluc Sensor (FREESTYLE LIBRE 2 SENSOR) MISC Apply new sensor every 14 days to monitor blood glucose. Remove old sensor before applying new one.   dicyclomine (BENTYL) 10 MG capsule Take 1 capsule (10 mg total) by mouth 3 (three) times daily before meals.   ezetimibe (ZETIA) 10 MG tablet TAKE ONE TABLET BY MOUTH ONCE DAILY   famotidine (PEPCID) 40 MG tablet TAKE ONE TABLET BY MOUTH ONCE DAILY   fluticasone (FLONASE) 50 MCG/ACT nasal spray Place 2 sprays into both nostrils in the morning and at bedtime.   glucose blood (ONETOUCH ULTRA) test strip USE UP TO 4 TIMES DAILY AS DIRECTED   ibuprofen (ADVIL) 600 MG tablet Take 1 tablet (600 mg total) by mouth every 6 (six) hours as needed (alternate with acetaminophen).   Lancets MISC Use to check blood sugars up to 4 times daily as directed. E11.9   lidocaine (LIDODERM) 5 % Place 1 patch onto the skin daily as needed. Remove & Discard patch within 12 hours or as directed by MD   lubiprostone ([name]) 24 MCG capsule TAKE ONE CAPSULE BY MOUTH TWICE DAILY WITH A MEAL   meclizine (ANTIVERT) 12.5 MG tablet TAKE 1 TABLET BY MOUTH THREE TIMES A DAY AS NEEDED FOR DIZZINESS   Multiple Vitamin (MULTIVITAMIN WITH MINERALS) TABS tablet Take 1 tablet by mouth daily after lunch.   nitroGLYCERIN (NITROSTAT) 0.4 MG SL tablet Place 1 tablet (0.4 mg total) under the tongue every 5 (five) minutes as needed for chest pain.   omeprazole (PRILOSEC) 40 MG capsule TAKE ONE CAPSULE BY MOUTH ONCE DAILY   OVER THE COUNTER MEDICATION Place 1 drop into both eyes 2 (two) times daily as needed (dry eyes/ pataday). Over the counter eye drop for itching   OXcarbazepine (TRILEPTAL) 150 MG  tablet TAKE ONE TABLET BY MOUTH TWICE DAILY   OZEMPIC, 1 MG/DOSE, 4 MG/3ML SOPN INJECT ONE MG AS DIRECTED ONCE A WEEK   Polyvinyl Alcohol-Povidone (REFRESH OP) Place 1 drop into both eyes daily as needed (dry eyes).   potassium chloride SA (KLOR-CON M) 20 MEQ tablet TAKE ONE TABLET BY MOUTH THREE TIMES DAILY   pregabalin (LYRICA) 100 MG capsule TAKE ONE CAPSULE BY MOUTH THREE TIMES DAILY   Probiotic Product (PROBIOTIC PO) Take 1 capsule by mouth daily after breakfast.   rosuvastatin (CRESTOR) 5 MG tablet TAKE ONE TABLET BY MOUTH EVERYDAY AT BEDTIME   terconazole (TERAZOL 3) 0.8 % vaginal cream Place 1 applicator vaginally at bedtime. Use for 3 nights as needed     Allergies:   Cymbalta [duloxetine hcl], Effexor [venlafaxine], Linzess [linaclotide], Other, Statins, and Penicillins   Social History  Socioeconomic History   Marital status: Widowed    Spouse name: Not on file   Number of children: 2   Years of education: 16   Highest education level: Bachelor's degree (e.g., BA, AB, BS)  Occupational History   Occupation: Retired    Comment: admin at [employer]  Tobacco Use   Smoking status: Former    Types: Cigarettes    Quit date: 10/19/1985    Years since quitting: 37.0   Smokeless tobacco: Never  Vaping Use   Vaping Use: Never used  Substance and Sexual Activity   Alcohol use: Yes    Alcohol/week: 0.0 standard drinks of alcohol    Comment: rare glass of wine   Drug use: No   Sexual activity: Never  Other Topics Concern   Not on file  Social History Narrative   Patient lives in a one story home.  Has 2 children.  Retired from [employer].   Social Determinants of Health   Financial Resource Strain: Low Risk  (05/02/2022)   Overall Financial Resource Strain (CARDIA)    Difficulty of Paying Living Expenses: Not very hard  Food Insecurity: No Food Insecurity (05/02/2022)   Hunger Vital Sign    Worried About Running Out of Food in the Last Year: Never true    Ran Out of Food in the  Last Year: Never true  Transportation Needs: No Transportation Needs (05/02/2022)   PRAPARE - Administrator, Civil Service (Medical): No    Lack of Transportation (Non-Medical): No  Physical Activity: Inactive (05/02/2022)   Exercise Vital Sign    Days of Exercise per Week: 0 days    Minutes of Exercise per Session: 0 min  Stress: No Stress Concern Present (05/02/2022)   [employer] of Occupational Health - Occupational Stress Questionnaire    Feeling of Stress : Only a little  Social Connections: Moderately Integrated (05/02/2022)   Social Connection and Isolation Panel [NHANES]    Frequency of Communication with Friends and Family: More than three times a week    Frequency of Social Gatherings with Friends and Family: More than three times a week    Attends Religious Services: More than 4 times per year    Active Member of [employer] or Organizations: Yes    Attends Banker Meetings: More than 4 times per year    Marital Status: Widowed     Family History: The patient's family history includes Arthritis in her mother and another family member; Diabetes in an other family member; Heart disease in her father. There is no history of Colon cancer, Stomach cancer, or Pancreatic cancer.  ROS:   Please see the history of present illness.     All other systems reviewed and are negative.  EKGs/Labs/Other Studies Reviewed:    The following studies were reviewed today: Cardiac Studies & Procedures   CARDIAC CATHETERIZATION  CARDIAC CATHETERIZATION 01/06/2020  Narrative  Calcified left main, LAD, and proximal circumflex.  Widely patent left main  Widely patent LAD  Large branching ramus intermedius with ostial 75% narrowing in the more anterior subbranch, unchanged from 2019  Tortuous circumflex with ostial 65 to 75% stenosis, unchanged from prior.  The ostial vessel was calcified.  Anterior origin right coronary.  Widely patent without obstructive  disease.  Hyperdynamic left ventricular systolic function with EF greater than 65%.  EDP is normal.  RECOMMENDATIONS:   Persisting substernal chest pressure throughout the procedure with anatomy not demonstrating disease that could account for pain  at rest.  Images are similar to 2019.  She does have what would be a difficult to treat branch of the ramus intermedius and also ostial circumflex.  These regions are unchanged compared to 2019.  Aggressive risk factor modification.  Consider alternative explanations for continuous chest discomfort in absence of EKG findings and enzyme elevation.  Findings Coronary Findings Diagnostic  Dominance: Right  Left Anterior Descending There is mild diffuse disease throughout the vessel.  Ramus Intermedius Ramus lesion is 75% stenosed.  Left Circumflex Vessel is small. Ost Cx lesion is 70% stenosed.  First Obtuse Marginal Branch Vessel is small in size.  Second Obtuse Marginal Branch Vessel is small in size.  Intervention  No interventions have been documented.   CARDIAC CATHETERIZATION  CARDIAC CATHETERIZATION 05/28/2018  Narrative  The first diagonal is a large vessel that immediately branches into 2 equally sized subbranches.  The more medial branch contains ostial 90% stenosis.  Left main is widely patent  LAD contains diffuse 30% mid vessel stenosis.  Ostial circumflex contains 50 to 60% eccentric narrowing.  RCA contains mid eccentric 30% narrowing.  RCA also has a diagonal with origin and anterior takeoff.  Normal left ventricular systolic function with EF greater than 60% and normal filling pressures.  RECOMMENDATIONS:   Aspirin  Statin therapy  Diagonal branch is not a good interventional target due to ostial location/bifurcation.  Findings Coronary Findings Diagnostic  Dominance: Right  Left Anterior Descending Prox LAD to Mid LAD lesion is 30% stenosed.  Second Diagonal [employer] 2nd Diag lesion  is 90% stenosed.  Left Circumflex Ost Cx lesion is 50% stenosed.  Right Coronary Artery Prox RCA to Mid RCA lesion is 30% stenosed.  Intervention  No interventions have been documented.   STRESS TESTS  MYOCARDIAL PERFUSION IMAGING 11/16/2016  Narrative  Nuclear stress EF: 84%.  There was no ST segment deviation noted during stress.  No T wave inversion was noted during stress.  The study is normal.  This is a low risk study.  The left ventricular ejection fraction is hyperdynamic (>65%).   ECHOCARDIOGRAM  ECHOCARDIOGRAM COMPLETE 01/06/2020  Narrative ECHOCARDIOGRAM REPORT    Patient Name:   RONEISHA STERN Date of Exam: 01/06/2020 Medical Rec #:  [id number]             Height:       62.0 in Accession #:    [id number]            Weight:       154.3 lb Date of Birth:  Nov 24, 1940             BSA:          1.712 m Patient Age:    79 years              BP:           124/101 mmHg Patient Gender: F                     HR:           54 bpm. Exam Location:  Inpatient  Procedure: 2D Echo, Cardiac Doppler and Color Doppler  Indications:    Acute coronary syndrome I24.9  History:        Patient has prior history of Echocardiogram examinations, most recent 04/30/2009. CAD, Signs/Symptoms:Chest Pain and Dyspnea; Risk Factors:Hypertension, Dyslipidemia and Diabetes. Seizure.  Sonographer:    Sheralyn Boatman RDCS Referring Phys: [id number] Baruch Goldmann MATHEW   Sonographer  Comments: Technically difficult study due to poor echo windows. Image acquisition challenging due to respiratory motion. Patient was very sensitive to pressure from the probe. IMPRESSIONS   1. Left ventricular ejection fraction, by estimation, is 60 to 65%. The left ventricle has normal function. The left ventricle has no regional wall motion abnormalities. There is mild left ventricular hypertrophy. Left ventricular diastolic parameters are consistent with Grade I diastolic dysfunction (impaired relaxation). 2.  Right ventricular systolic function is normal. The right ventricular size is normal. Tricuspid regurgitation signal is inadequate for assessing PA pressure. 3. The mitral valve is normal in structure. Trivial mitral valve regurgitation. No evidence of mitral stenosis. 4. The aortic valve is tricuspid. Aortic valve regurgitation is not visualized. Mild aortic valve sclerosis is present, with no evidence of aortic valve stenosis. 5. The inferior vena cava is normal in size with greater than 50% respiratory variability, suggesting right atrial pressure of 3 mmHg.  FINDINGS Left Ventricle: Left ventricular ejection fraction, by estimation, is 60 to 65%. The left ventricle has normal function. The left ventricle has no regional wall motion abnormalities. The left ventricular internal cavity size was normal in size. There is mild left ventricular hypertrophy. Left ventricular diastolic parameters are consistent with Grade I diastolic dysfunction (impaired relaxation).  Right Ventricle: The right ventricular size is normal. Right ventricular systolic function is normal. Tricuspid regurgitation signal is inadequate for assessing PA pressure. The tricuspid regurgitant velocity is 2.03 m/s, and with an assumed right atrial pressure of 3 mmHg, the estimated right ventricular systolic pressure is 19.5 mmHg.  Left Atrium: Left atrial size was normal in size.  Right Atrium: Right atrial size was normal in size.  Pericardium: There is no evidence of pericardial effusion.  Mitral Valve: The mitral valve is normal in structure. Normal mobility of the mitral valve leaflets. Trivial mitral valve regurgitation. No evidence of mitral valve stenosis.  Tricuspid Valve: The tricuspid valve is normal in structure. Tricuspid valve regurgitation is trivial. No evidence of tricuspid stenosis.  Aortic Valve: The aortic valve is tricuspid. Aortic valve regurgitation is not visualized. Mild aortic valve sclerosis is present,  with no evidence of aortic valve stenosis.  Pulmonic Valve: The pulmonic valve was normal in structure. Pulmonic valve regurgitation is not visualized. No evidence of pulmonic stenosis.  Aorta: The aortic root is normal in size and structure.  Venous: The inferior vena cava is normal in size with greater than 50% respiratory variability, suggesting right atrial pressure of 3 mmHg.  IAS/Shunts: No atrial level shunt detected by color flow Doppler.   LEFT VENTRICLE PLAX 2D LVIDd:         2.90 cm     Diastology LVIDs:         1.60 cm     LV e' lateral:   8.05 cm/s LV PW:         1.10 cm     LV E/e' lateral: 4.7 LV IVS:        1.20 cm     LV e' medial:    6.09 cm/s LVOT diam:     1.80 cm     LV E/e' medial:  6.3 LV SV:         41 LV SV Index:   24 LVOT Area:     2.54 cm  LV Volumes (MOD) LV vol d, MOD A2C: 45.1 ml LV vol d, MOD A4C: 37.0 ml LV vol s, MOD A2C: 12.4 ml LV vol s, MOD A4C: 13.8 ml LV  SV MOD A2C:     32.7 ml LV SV MOD A4C:     37.0 ml LV SV MOD BP:      29.3 ml  RIGHT VENTRICLE             IVC RV S prime:     11.50 cm/s  IVC diam: 1.60 cm TAPSE (M-mode): 2.2 cm  LEFT ATRIUM             Index       RIGHT ATRIUM           Index LA diam:        2.90 cm 1.69 cm/m  RA Area:     10.10 cm LA Vol (A2C):   32.6 ml 19.04 ml/m RA Volume:   20.70 ml  12.09 ml/m LA Vol (A4C):   23.1 ml 13.49 ml/m LA Biplane Vol: 29.4 ml 17.17 ml/m AORTIC VALVE LVOT Vmax:   73.70 cm/s LVOT Vmean:  45.100 cm/s LVOT VTI:    0.160 m  AORTA Ao Root diam: 2.50 cm  MITRAL VALVE               TRICUSPID VALVE MV Area (PHT): 2.42 cm    TR Peak grad:   16.5 mmHg MV Decel Time: 313 msec    TR Vmax:        203.00 cm/s MV E velocity: 38.24 cm/s MV A velocity: 76.60 cm/s  SHUNTS MV E/A ratio:  0.50        Systemic VTI:  0.16 m Systemic Diam: 1.80 cm  Olga Millers MD Electronically signed by Olga Millers MD Signature Date/Time: 01/06/2020/1:39:40 PM    Final     CT SCANS  CT  CORONARY MORPH W/CTA COR W/SCORE 05/22/2018  Addendum 05/22/2018  5:57 PM ADDENDUM REPORT: 05/22/2018 17:55  EXAM: CT FFR ANALYSIS  FINDINGS: FFRct analysis was performed on the original cardiac CT angiogram dataset. Diagrammatic representation of the FFRct analysis is provided in a separate PDF document in PACS. This dictation was created using the PDF document and an interactive 3D model of the results. 3D model is not available in the EMR/PACS. Normal FFR range is >0.80.  1. Left Main:  No significant stenosis.  2. LAD: No significant stenosis. 3. D1: FFR <0.8. 4. LCX: No significant stenosis. 5. RCA: No significant stenosis.  IMPRESSION: 1. CT FFR analysis showed significant stenosis in the 1. diagonal artery but not in proximal LAD.   Electronically Signed By: Tobias Alexander On: 05/22/2018 17:55  Addendum 05/22/2018  9:53 AM ADDENDUM REPORT: 05/22/2018 09:50  CLINICAL DATA:  82 year old female, former smoker with h/o DM, hypertension and chest pain.  EXAM: Cardiac/Coronary  CT  TECHNIQUE: The patient was scanned on a [employer].  FINDINGS: A 120 kV prospective scan was triggered in the descending thoracic aorta at 111 HU's. Axial non-contrast 3 mm slices were carried out through the heart. The data set was analyzed on a dedicated work station and scored using the Agatson method. Gantry rotation speed was 250 msecs and collimation was .6 mm. No beta blockade and 0.8 mg of sl NTG was given. The 3D data set was reconstructed in 5% intervals of the 67-82 % of the R-R cycle. Diastolic phases were analyzed on a dedicated work station using MPR, MIP and VRT modes. The patient received 80 cc of contrast.  Aorta: Normal size. Mild diffuse calcifications in the aortic arch and descending aorta. No dissection.  Aortic Valve: Trileaflet. No calcifications, mild  leaflet thickening.  Coronary Arteries:  Normal coronary origin.  Right dominance.  RCA  is a large dominant artery that gives rise to PDA and PLA. There is moderate calcified ostial plaque with stenosis 50-69%. Proximal RCA has a long, moderate, mixed, predominantly calcified plaque with stenosis 50-69%. Mid RCA has a moderate focal non-calcified plaque with stenosis 50-69%. Distal RCA/PDA/PLA have minimal plaque.  Left main is a large artery that gives rise to LAD and LCX arteries. Left main has minimal plaque.  LAD is a medium caliber vessel that gives rise to one large diagonal artery. Ostial LAD has a moderate calcified plaque with stenosis 50-69%. Proximal LAD has a long, severe, mixed, predominantly calcified plaque with stenosis > 70% at the distal portion of the plaque. Mid to distal LAD has mild diffuse plaque < 50%.  D1 is a large artery that sub-branches. There is severe calcified plaque in the proximal portion with stenosis suspicious for > 70%.  LCX is a medium size non-dominant artery. There is moderate calcified ostial plaque with stenosis 50-69%.  Other findings:  Normal pulmonary vein drainage into the left atrium.  Normal let atrial appendage without a thrombus.  Normal size of the pulmonary artery.  IMPRESSION: 1. Coronary calcium score of 777. This was 95% percentile for age and sex matched control.  2. Normal coronary origin with right dominance.  3. Diffuse three vessel disease with suspicion for > 70% stenosis in the proximal LAD and proximal portion of the 1. diagonal artery. Cardiac catheterization is recommended.   Electronically Signed By: Tobias Alexander On: 05/22/2018 09:50  Narrative EXAM: OVER-READ INTERPRETATION  CT CHEST  The following report is an over-read performed by radiologist Dr. Charlett Nose of [facility] on 05/18/2018. This over-read does not include interpretation of cardiac or coronary anatomy or pathology. The coronary CTA interpretation by the cardiologist is attached.  COMPARISON:   08/16/2008  FINDINGS: Vascular: Heart is normal size. Visualized aorta is normal caliber. Scattered calcifications in the descending thoracic aorta.  Mediastinum/Nodes: No adenopathy in the lower mediastinum or hila.  Lungs/Pleura: Visualized lungs clear.  No effusions.  Upper Abdomen: Imaging into the upper abdomen shows no acute findings.  Musculoskeletal: Chest wall soft tissues are unremarkable. No acute bony abnormality.  IMPRESSION: No acute or significant extracardiac abnormality.  Electronically Signed: By: Charlett Nose M.D. On: 05/18/2018 12:34           EKG: 10/19/2022 sinus rhythm nonspecific T wave changes.  No significant change.  Recent Labs: 08/04/2022: ALT 20; BUN 13; Creatinine, Ser 0.80; Hemoglobin 14.8; Platelets 260.0; Potassium 4.1; Sodium 140  Recent Lipid Panel    Component Value Date/Time   CHOL 242 (H) 08/04/2022 1134   TRIG 157.0 (H) 08/04/2022 1134   HDL 63.90 08/04/2022 1134   CHOLHDL 4 08/04/2022 1134   VLDL 31.4 08/04/2022 1134   LDLCALC 147 (H) 08/04/2022 1134   LDLDIRECT 113.0 07/21/2017 1643     Risk Assessment/Calculations:               Physical Exam:    VS:  BP 98/62   Pulse 71   Ht 5\' 2"  (1.575 m)   Wt 165 lb 6.4 oz (75 kg)   SpO2 96%   BMI 30.25 kg/m     Wt Readings from Last 3 Encounters:  10/19/22 165 lb 6.4 oz (75 kg)  08/04/22 166 lb (75.3 kg)  03/14/22 167 lb (75.8 kg)     GEN:  Well nourished, well  developed in no acute distress HEENT: Normal NECK: No JVD; No carotid bruits LYMPHATICS: No lymphadenopathy CARDIAC: RRR, no murmurs, rubs, gallops RESPIRATORY:  Clear to auscultation without rales, wheezing or rhonchi  ABDOMEN: Soft, non-tender, non-distended MUSCULOSKELETAL:  No edema; No deformity  SKIN: Warm and dry NEUROLOGIC:  Alert and oriented x 3 PSYCHIATRIC:  Normal affect   ASSESSMENT:    1. Chest pain of uncertain etiology   2. Coronary artery disease involving native coronary artery of  native heart without angina pectoris    PLAN:    In order of problems listed above:  Chest discomfort -Continue to monitor.  If symptoms worsen or become more worrisome she will reach out to [place].  For now continue with current medication management as previously directed by Dr. Katrinka Blazing.  Coronary artery disease # Nonobstructive catheterization reviewed.            Medication Adjustments/Labs and Tests Ordered: Current medicines are reviewed at length with the patient today.  Concerns regarding medicines are outlined above.  Orders Placed This Encounter  Procedures   EKG 12-Lead   No orders of the defined types were placed in this encounter.   Patient Instructions  Medication Instructions:  Your physician recommends that you continue on your current medications as directed. Please refer to the Current Medication list given to you today.  *If you need a refill on your cardiac medications before your next appointment, please call your pharmacy*  Follow-Up: At [facility], you and your health needs are our priority.  As part of our continuing mission to provide you with exceptional heart care, we have created designated Provider Care Teams.  These Care Teams include your primary Cardiologist (physician) and Advanced Practice Providers (APPs -  Physician Assistants and Nurse Practitioners) who all work together to provide you with the care you need, when you need it.  We recommend signing up for the patient portal called "MyChart".  Sign up information is provided on this After Visit Summary.  MyChart is used to connect with patients for Virtual Visits (Telemedicine).  Patients are able to view lab/test results, encounter notes, upcoming appointments, etc.  Non-urgent messages can be sent to your provider as well.   To learn more about what you can do with MyChart, go to [email or web address].    Your next appointment:   1 year(s)  Provider:   Dr. Anne Fu     Signed, Donato Schultz, MD  10/19/2022 5:24 PM    [facility]

## 2022-10-25 ENCOUNTER — Other Ambulatory Visit: Payer: Self-pay | Admitting: Internal Medicine

## 2022-11-02 ENCOUNTER — Ambulatory Visit (INDEPENDENT_AMBULATORY_CARE_PROVIDER_SITE_OTHER): Payer: PPO | Admitting: Internal Medicine

## 2022-11-02 ENCOUNTER — Encounter: Payer: Self-pay | Admitting: Internal Medicine

## 2022-11-02 VITALS — BP 116/78 | HR 75 | Temp 98.7°F | Ht 62.0 in | Wt 164.0 lb

## 2022-11-02 DIAGNOSIS — E1142 Type 2 diabetes mellitus with diabetic polyneuropathy: Secondary | ICD-10-CM | POA: Diagnosis not present

## 2022-11-02 DIAGNOSIS — K5900 Constipation, unspecified: Secondary | ICD-10-CM

## 2022-11-02 DIAGNOSIS — R300 Dysuria: Secondary | ICD-10-CM | POA: Diagnosis not present

## 2022-11-02 DIAGNOSIS — M542 Cervicalgia: Secondary | ICD-10-CM | POA: Diagnosis not present

## 2022-11-02 DIAGNOSIS — I2510 Atherosclerotic heart disease of native coronary artery without angina pectoris: Secondary | ICD-10-CM

## 2022-11-02 DIAGNOSIS — G40909 Epilepsy, unspecified, not intractable, without status epilepticus: Secondary | ICD-10-CM

## 2022-11-02 DIAGNOSIS — E876 Hypokalemia: Secondary | ICD-10-CM

## 2022-11-02 DIAGNOSIS — F418 Other specified anxiety disorders: Secondary | ICD-10-CM

## 2022-11-02 DIAGNOSIS — E785 Hyperlipidemia, unspecified: Secondary | ICD-10-CM

## 2022-11-02 DIAGNOSIS — Z0000 Encounter for general adult medical examination without abnormal findings: Secondary | ICD-10-CM | POA: Diagnosis not present

## 2022-11-02 DIAGNOSIS — K219 Gastro-esophageal reflux disease without esophagitis: Secondary | ICD-10-CM

## 2022-11-02 DIAGNOSIS — K588 Other irritable bowel syndrome: Secondary | ICD-10-CM | POA: Diagnosis not present

## 2022-11-02 DIAGNOSIS — Z7985 Long-term (current) use of injectable non-insulin antidiabetic drugs: Secondary | ICD-10-CM | POA: Diagnosis not present

## 2022-11-02 DIAGNOSIS — E2839 Other primary ovarian failure: Secondary | ICD-10-CM

## 2022-11-02 DIAGNOSIS — Z13820 Encounter for screening for osteoporosis: Secondary | ICD-10-CM

## 2022-11-02 LAB — URINALYSIS, ROUTINE W REFLEX MICROSCOPIC
Bilirubin Urine: NEGATIVE
Hgb urine dipstick: NEGATIVE
Ketones, ur: NEGATIVE
Leukocytes,Ua: NEGATIVE
Nitrite: NEGATIVE
Specific Gravity, Urine: 1.025 (ref 1.000–1.030)
Total Protein, Urine: NEGATIVE
Urine Glucose: NEGATIVE
Urobilinogen, UA: 0.2 (ref 0.0–1.0)
pH: 6 (ref 5.0–8.0)

## 2022-11-02 LAB — MICROALBUMIN / CREATININE URINE RATIO
Creatinine,U: 163.3 mg/dL
Microalb Creat Ratio: 4.6 mg/g (ref 0.0–30.0)
Microalb, Ur: 7.5 mg/dL — ABNORMAL HIGH (ref 0.0–1.9)

## 2022-11-02 LAB — HEMOGLOBIN A1C: Hgb A1c MFr Bld: 6.8 % — ABNORMAL HIGH (ref 4.6–6.5)

## 2022-11-02 LAB — COMPREHENSIVE METABOLIC PANEL
ALT: 17 U/L (ref 0–35)
AST: 25 U/L (ref 0–37)
Albumin: 4.5 g/dL (ref 3.5–5.2)
Alkaline Phosphatase: 65 U/L (ref 39–117)
BUN: 15 mg/dL (ref 6–23)
CO2: 30 mEq/L (ref 19–32)
Calcium: 10.5 mg/dL (ref 8.4–10.5)
Chloride: 99 mEq/L (ref 96–112)
Creatinine, Ser: 0.72 mg/dL (ref 0.40–1.20)
GFR: 79.27 mL/min (ref >60.00)
Glucose, Bld: 78 mg/dL (ref 70–99)
Potassium: 3.8 mEq/L (ref 3.5–5.1)
Sodium: 139 mEq/L (ref 135–145)
Total Bilirubin: 0.3 mg/dL (ref 0.2–1.2)
Total Protein: 8 g/dL (ref 6.0–8.3)

## 2022-11-02 LAB — LIPID PANEL
Cholesterol: 231 mg/dL — ABNORMAL HIGH (ref 0–200)
HDL: 68 mg/dL (ref >39.00)
LDL Cholesterol: 136 mg/dL — ABNORMAL HIGH (ref 0–99)
NonHDL: 163.49
Total CHOL/HDL Ratio: 3
Triglycerides: 138 mg/dL (ref 0.0–149.0)
VLDL: 27.6 mg/dL (ref 0.0–40.0)

## 2022-11-02 MED ORDER — DOCUSATE SODIUM 100 MG PO CAPS: 300.0000 mg | ORAL_CAPSULE | Freq: Every day | ORAL | 11 refills | Status: DC

## 2022-11-02 NOTE — Assessment & Plan Note (Signed)
Chronic Regular exercise and healthy diet encouraged Check lipid panel  Continue Zetia 10 mg daily, Crestor 5 mg daily 

## 2022-11-02 NOTE — Assessment & Plan Note (Signed)
Persistent  Experiencing some dysuria as well eyes vaginal discharge and itching UA, urine culture to rule out UTI May need to see gyn for further eval

## 2022-11-02 NOTE — Assessment & Plan Note (Signed)
Chronic Following with cardiology Having some chest pain - has discussed with cardiology Continue aspirin 81 mg daily, Crestor 5 mg daily, Zetia 10 mg daily

## 2022-11-02 NOTE — Assessment & Plan Note (Signed)
Chronic   Lab Results  Component Value Date   HGBA1C 6.8 (H) 08/04/2022   Sugars controlled Check A1c, urine microalbumin Continue Ozempic 1 mg weekly Jardiance stopped to see if that would help GU symptoms at her last visit Stressed regular exercise, diabetic diet

## 2022-11-02 NOTE — Patient Instructions (Addendum)
Blood work was ordered.   The lab is on the first floor.    Medications changes include :   try taking 3 colace a day.  Eat your prunes daily.   Try drinking smooth move tea ( for constipation).    A bone density test was for the breast center.  Someone will call you to schedule an appointment.    A referral was ordered for Dr Suszanne Conners - ENT.     Return in about 6 months (around 05/05/2023) for follow up.     Health Maintenance, Female Adopting a healthy lifestyle and getting preventive care are important in promoting health and wellness. Ask your health care provider about: The right schedule for you to have regular tests and exams. Things you can do on your own to prevent diseases and keep yourself healthy. What should I know about diet, weight, and exercise? Eat a healthy diet  Eat a diet that includes plenty of vegetables, fruits, low-fat dairy products, and lean protein. Do not eat a lot of foods that are high in solid fats, added sugars, or sodium. Maintain a healthy weight Body mass index (BMI) is used to identify weight problems. It estimates body fat based on height and weight. Your health care provider can help determine your BMI and help you achieve or maintain a healthy weight. Get regular exercise Get regular exercise. This is one of the most important things you can do for your health. Most adults should: Exercise for at least 150 minutes each week. The exercise should increase your heart rate and make you sweat (moderate-intensity exercise). Do strengthening exercises at least twice a week. This is in addition to the moderate-intensity exercise. Spend less time sitting. Even light physical activity can be beneficial. Watch cholesterol and blood lipids Have your blood tested for lipids and cholesterol at 80 years of age, then have this test every 5 years. Have your cholesterol levels checked more often if: Your lipid or cholesterol levels are high. You are  older than 80 years of age. You are at high risk for heart disease. What should I know about cancer screening? Depending on your health history and family history, you may need to have cancer screening at various ages. This may include screening for: Breast cancer. Cervical cancer. Colorectal cancer. Skin cancer. Lung cancer. What should I know about heart disease, diabetes, and high blood pressure? Blood pressure and heart disease High blood pressure causes heart disease and increases the risk of stroke. This is more likely to develop in people who have high blood pressure readings or are overweight. Have your blood pressure checked: Every 3-5 years if you are 49-48 years of age. Every year if you are 67 years old or older. Diabetes Have regular diabetes screenings. This checks your fasting blood sugar level. Have the screening done: Once every three years after age 52 if you are at a normal weight and have a low risk for diabetes. More often and at a younger age if you are overweight or have a high risk for diabetes. What should I know about preventing infection? Hepatitis B If you have a higher risk for hepatitis B, you should be screened for this virus. Talk with your health care provider to find out if you are at risk for hepatitis B infection. Hepatitis C Testing is recommended for: Everyone born from [birth year] through 1963. Anyone with known risk factors for hepatitis C. Sexually transmitted infections (STIs) Get screened for STIs, including  gonorrhea and chlamydia, if: You are sexually active and are younger than 80 years of age. You are older than 80 years of age and your health care provider tells you that you are at risk for this type of infection. Your sexual activity has changed since you were last screened, and you are at increased risk for chlamydia or gonorrhea. Ask your health care provider if you are at risk. Ask your health care provider about whether you are at high risk  for HIV. Your health care provider may recommend a prescription medicine to help prevent HIV infection. If you choose to take medicine to prevent HIV, you should first get tested for HIV. You should then be tested every 3 months for as long as you are taking the medicine. Pregnancy If you are about to stop having your period (premenopausal) and you may become pregnant, seek counseling before you get pregnant. Take 400 to 800 micrograms (mcg) of folic acid every day if you become pregnant. Ask for birth control (contraception) if you want to prevent pregnancy. Osteoporosis and menopause Osteoporosis is a disease in which the bones lose minerals and strength with aging. This can result in bone fractures. If you are 20 years old or older, or if you are at risk for osteoporosis and fractures, ask your health care provider if you should: Be screened for bone loss. Take a calcium or vitamin D supplement to lower your risk of fractures. Be given hormone replacement therapy (HRT) to treat symptoms of menopause. Follow these instructions at home: Alcohol use Do not drink alcohol if: Your health care provider tells you not to drink. You are pregnant, may be pregnant, or are planning to become pregnant. If you drink alcohol: Limit how much you have to: 0-1 drink a day. Know how much alcohol is in your drink. In the [place] one drink equals one 12 oz bottle of beer (355 mL), one 5 oz glass of wine (148 mL), or one 1 oz glass of hard liquor (44 mL). Lifestyle Do not use any products that contain nicotine or tobacco. These products include cigarettes, chewing tobacco, and vaping devices, such as e-cigarettes. If you need help quitting, ask your health care provider. Do not use street drugs. Do not share needles. Ask your health care provider for help if you need support or information about quitting drugs. General instructions Schedule regular health, dental, and eye exams. Stay current with your  vaccines. Tell your health care provider if: You often feel depressed. You have ever been abused or do not feel safe at home. Summary Adopting a healthy lifestyle and getting preventive care are important in promoting health and wellness. Follow your health care provider's instructions about healthy diet, exercising, and getting tested or screened for diseases. Follow your health care provider's instructions on monitoring your cholesterol and blood pressure. This information is not intended to replace advice given to you by your health care provider. Make sure you discuss any questions you have with your health care provider. Document Revised: 11/02/2020 Document Reviewed: 11/02/2020 Elsevier Patient Education  2023 [employer].

## 2022-11-02 NOTE — Assessment & Plan Note (Signed)
Chronic Still with constipation and LLQ discomfort that improves after a BM Not taking anything consistently Not taking amitiza Stressed taking the stool softener daily - can take up to 3 colace / day Advised eating 2-3 prunes daily Increase water intake Advised to try smooth move tea

## 2022-11-02 NOTE — Assessment & Plan Note (Addendum)
Chronic GERD controlled Continue omeprazole 40 mg daily, pepcid 40 mg daily  

## 2022-11-02 NOTE — Assessment & Plan Note (Signed)
Chronic Denies any seizure-like activity Controlled Continue oxycarbamazepine 150 mg twice daily

## 2022-11-02 NOTE — Progress Notes (Signed)
Subjective:    Patient ID: [name], female    DOB: 12-Aug-1940, 82 y.o.   MRN: [id number]      HPI [name] is here for a Physical exam and her chronic medical problems.   Right neck soreness.  Present x 4-5 months.    Still with urine infection/yeast infection.    Medications and allergies reviewed with patient and updated if appropriate.  Current Outpatient Medications on File Prior to Visit  Medication Sig Dispense Refill   acetaminophen (TYLENOL) 500 MG tablet Take 500 mg by mouth every 6 (six) hours as needed for headache (pain).     albuterol (VENTOLIN HFA) 108 (90 Base) MCG/ACT inhaler Inhale 2 puffs into the lungs every 6 (six) hours as needed for wheezing or shortness of breath.     aspirin EC 81 MG tablet Take 1 tablet (81 mg total) by mouth daily. 90 tablet 3   clonazePAM (KLONOPIN) 0.5 MG tablet TAKE 1/2 TO 1 TABLET TWICE A DAY AS NEEDED FOR ANXIETY 60 tablet 3   Continuous Blood Gluc Sensor (FREESTYLE LIBRE 2 SENSOR) MISC Apply new sensor every 14 days to monitor blood glucose. Remove old sensor before applying new one. 1 each 11   dicyclomine (BENTYL) 10 MG capsule Take 1 capsule (10 mg total) by mouth 3 (three) times daily before meals. 90 capsule 11   ezetimibe (ZETIA) 10 MG tablet TAKE ONE TABLET BY MOUTH ONCE DAILY 90 tablet 1   famotidine (PEPCID) 40 MG tablet TAKE ONE TABLET BY MOUTH ONCE DAILY 30 tablet 5   fluticasone (FLONASE) 50 MCG/ACT nasal spray Place 2 sprays into both nostrils in the morning and at bedtime.     glucose blood (ONETOUCH ULTRA) test strip USE UP TO 4 TIMES DAILY AS DIRECTED 400 strip 3   ibuprofen (ADVIL) 600 MG tablet Take 1 tablet (600 mg total) by mouth every 6 (six) hours as needed (alternate with acetaminophen). 30 tablet 0   Lancets MISC Use to check blood sugars up to 4 times daily as directed. E11.9 400 each 3   lidocaine (LIDODERM) 5 % Place 1 patch onto the skin daily as needed. Remove & Discard patch within 12 hours  or as directed by MD 15 patch 0   meclizine (ANTIVERT) 12.5 MG tablet TAKE 1 TABLET BY MOUTH THREE TIMES A DAY AS NEEDED FOR DIZZINESS 30 tablet 0   Multiple Vitamin (MULTIVITAMIN WITH MINERALS) TABS tablet Take 1 tablet by mouth daily after lunch.     nitroGLYCERIN (NITROSTAT) 0.4 MG SL tablet Place 1 tablet (0.4 mg total) under the tongue every 5 (five) minutes as needed for chest pain. 25 tablet 2   omeprazole (PRILOSEC) 40 MG capsule TAKE ONE CAPSULE BY MOUTH ONCE DAILY 90 capsule 1   OVER THE COUNTER MEDICATION Place 1 drop into both eyes 2 (two) times daily as needed (dry eyes/ pataday). Over the counter eye drop for itching     OXcarbazepine (TRILEPTAL) 150 MG tablet TAKE ONE TABLET BY MOUTH TWICE DAILY 180 tablet 1   OZEMPIC, 1 MG/DOSE, 4 MG/3ML SOPN INJECT ONE MG AS DIRECTED ONCE A WEEK 9 mL 1   Polyvinyl Alcohol-Povidone (REFRESH OP) Place 1 drop into both eyes daily as needed (dry eyes).     potassium chloride SA (KLOR-CON M) 20 MEQ tablet TAKE ONE TABLET BY MOUTH THREE TIMES DAILY 270 tablet 1   pregabalin (LYRICA) 100 MG capsule TAKE ONE CAPSULE BY MOUTH THREE TIMES DAILY 90 capsule 1  Probiotic Product (PROBIOTIC PO) Take 1 capsule by mouth daily after breakfast.     rosuvastatin (CRESTOR) 5 MG tablet TAKE ONE TABLET BY MOUTH EVERYDAY AT BEDTIME 90 tablet 4   terconazole (TERAZOL 3) 0.8 % vaginal cream Place 1 applicator vaginally at bedtime. Use for 3 nights as needed 20 g 2   [DISCONTINUED] Calcium Carbonate (CALCIUM 500 PO) Take 1 capsule by mouth every other day.      No current facility-administered medications on file prior to visit.    Review of Systems  Constitutional:  Negative for fever.  Eyes:  Negative for visual disturbance.  Respiratory:  Positive for cough (occ, dry cough) and shortness of breath (if she goes too quick or does too much- not new). Negative for wheezing.   Cardiovascular:  Positive for chest pain (discussed with dr Anne Fu). Negative for palpitations  and leg swelling.  Gastrointestinal:  Positive for abdominal pain (LLQ soreness - 6-8 months - improved with good BM) and constipation. Negative for blood in stool and diarrhea.       Controlled- gerd only occ  Genitourinary:  Positive for vaginal discharge. Negative for dysuria.       Vulvar itching  Musculoskeletal:  Positive for back pain (lower back). Negative for arthralgias.  Skin:  Negative for rash.  Neurological:  Positive for light-headedness, numbness (b/l hands/feet - pain, N/T) and headaches.  Psychiatric/Behavioral:  Negative for dysphoric mood. The patient is nervous/anxious (situational).        Objective:   Vitals:   11/02/22 1258  BP: 116/78  Pulse: 75  Temp: 98.7 F (37.1 C)  SpO2: 98%   Filed Weights   11/02/22 1258  Weight: 164 lb (74.4 kg)   Body mass index is 30 kg/m.  BP Readings from Last 3 Encounters:  11/02/22 116/78  10/19/22 98/62  08/04/22 128/80    Wt Readings from Last 3 Encounters:  11/02/22 164 lb (74.4 kg)  10/19/22 165 lb 6.4 oz (75 kg)  08/04/22 166 lb (75.3 kg)       Physical Exam Constitutional: She appears well-developed and well-nourished. No distress.  HENT:  Head: Normocephalic and atraumatic.  Right Ear: External ear normal. Normal ear canal and TM Left Ear: External ear normal.  Normal ear canal and TM Mouth/Throat: Oropharynx is clear and moist.  Eyes: Conjunctivae normal.  Neck: Neck supple. No tracheal deviation present. No thyromegaly present.  No carotid bruit  Cardiovascular: Normal rate, regular rhythm and normal heart sounds.   No murmur heard.  No edema. Pulmonary/Chest: Effort normal and breath sounds normal. No respiratory distress. She has no wheezes. She has no rales.  Breast: deferred   Abdominal: Soft. She exhibits no distension. There is no tenderness.  Lymphadenopathy: She has no cervical adenopathy.  Skin: Skin is warm and dry. She is not diaphoretic.  Psychiatric: She has a normal mood and  affect. Her behavior is normal.     Lab Results  Component Value Date   WBC 6.8 08/04/2022   HGB 14.8 08/04/2022   HCT 44.6 08/04/2022   PLT 260.0 08/04/2022   GLUCOSE 101 (H) 08/04/2022   CHOL 242 (H) 08/04/2022   TRIG 157.0 (H) 08/04/2022   HDL 63.90 08/04/2022   LDLDIRECT 113.0 07/21/2017   LDLCALC 147 (H) 08/04/2022   ALT 20 08/04/2022   AST 24 08/04/2022   NA 140 08/04/2022   K 4.1 08/04/2022   CL 101 08/04/2022   CREATININE 0.80 08/04/2022   BUN 13 08/04/2022  CO2 31 08/04/2022   TSH 1.39 02/06/2019   INR 1.00 01/03/2017   HGBA1C 6.8 (H) 08/04/2022   MICROALBUR 3.9 (H) 08/04/2022         Assessment & Plan:   Physical exam: Screening blood work  ordered Exercise  walking Weight  slowly losing weight  - would benefit from a little more weight loss Substance abuse  none   Reviewed recommended immunizations.   Health Maintenance  Topic Date Due   DTaP/Tdap/Td (1 - Tdap) 05/03/2012   OPHTHALMOLOGY EXAM  11/01/2019   DEXA SCAN  01/17/2021   COVID-19 Vaccine (6 - 2023-24 season) 11/18/2022 (Originally 06/21/2022)   INFLUENZA VACCINE  01/26/2023   HEMOGLOBIN A1C  02/02/2023   FOOT EXAM  04/26/2023   Medicare Annual Wellness (AWV)  05/03/2023   Diabetic kidney evaluation - eGFR measurement  08/05/2023   Diabetic kidney evaluation - Urine ACR  08/05/2023   Pneumonia Vaccine 39+ Years old  Completed   Hepatitis C Screening  Completed   Zoster Vaccines- Shingrix  Completed   HPV VACCINES  Aged Out   COLONOSCOPY (Pts 45-14yrs Insurance coverage will need to be confirmed)  Discontinued          See Problem List for Assessment and Plan of chronic medical problems.

## 2022-11-02 NOTE — Assessment & Plan Note (Signed)
Has chronic constipation  Not taking stool softener daily - advised taking daily

## 2022-11-02 NOTE — Assessment & Plan Note (Signed)
Chronic CMP Continue potassium 20 mEq 3 times a day

## 2022-11-02 NOTE — Assessment & Plan Note (Signed)
Subacute Started 4-5 months ago Prominent LN on right upper precervical chain  Discussed [place] vs ENT eval ---  Referred to ENT per Pt preference

## 2022-11-02 NOTE — Assessment & Plan Note (Signed)
Chronic Pain is not ideally controlled Has not tolerated gabapentin, nortriptyline, Cymbalta and Effexor in the past Continue Lyrica 100 mg 3 times daily

## 2022-11-02 NOTE — Assessment & Plan Note (Signed)
Chronic Controlled, Stable Continue clonazepam 0.25-0.5 mg twice daily as needed - does not take often

## 2022-11-03 LAB — URINE CULTURE

## 2022-11-24 ENCOUNTER — Other Ambulatory Visit: Payer: Self-pay | Admitting: Internal Medicine

## 2022-11-24 DIAGNOSIS — I2510 Atherosclerotic heart disease of native coronary artery without angina pectoris: Secondary | ICD-10-CM

## 2022-11-30 ENCOUNTER — Other Ambulatory Visit: Payer: Self-pay | Admitting: Internal Medicine

## 2022-11-30 DIAGNOSIS — K5900 Constipation, unspecified: Secondary | ICD-10-CM

## 2022-12-06 ENCOUNTER — Other Ambulatory Visit: Payer: Self-pay | Admitting: Internal Medicine

## 2022-12-06 DIAGNOSIS — K5900 Constipation, unspecified: Secondary | ICD-10-CM

## 2022-12-08 ENCOUNTER — Other Ambulatory Visit: Payer: Self-pay | Admitting: Internal Medicine

## 2022-12-26 ENCOUNTER — Other Ambulatory Visit: Payer: Self-pay | Admitting: Gastroenterology

## 2023-01-23 ENCOUNTER — Telehealth: Payer: Self-pay | Admitting: Internal Medicine

## 2023-01-23 ENCOUNTER — Other Ambulatory Visit: Payer: Self-pay | Admitting: Internal Medicine

## 2023-01-23 DIAGNOSIS — K5900 Constipation, unspecified: Secondary | ICD-10-CM

## 2023-01-23 NOTE — Telephone Encounter (Signed)
Patient called and said her pharmacy told her that lubiprostone was denied when she requested a refill. She would like to know why it was denied. She would like a call back at [phone].

## 2023-01-24 NOTE — Telephone Encounter (Signed)
Rx pending-not sure where she wants it sent.  It was denied previously because it was no longer on her medication list-likely she told that she was not taking it at 1 point and it was taken off

## 2023-01-24 NOTE — Telephone Encounter (Signed)
lubiprostone ([name]) 24 MCG capsule  is not on the list. Is this ok refill.Marland KitchenRaechel Chute

## 2023-01-25 MED ORDER — LUBIPROSTONE 24 MCG PO CAPS
ORAL_CAPSULE | ORAL | 3 refills | Status: DC
Start: 2023-01-25 — End: 2023-05-08

## 2023-01-25 NOTE — Telephone Encounter (Signed)
Refill sent today.

## 2023-02-17 ENCOUNTER — Other Ambulatory Visit: Payer: Self-pay | Admitting: Internal Medicine

## 2023-02-21 ENCOUNTER — Other Ambulatory Visit: Payer: Self-pay | Admitting: Internal Medicine

## 2023-02-21 ENCOUNTER — Other Ambulatory Visit: Payer: Self-pay | Admitting: Gastroenterology

## 2023-02-24 ENCOUNTER — Other Ambulatory Visit (HOSPITAL_COMMUNITY): Payer: Self-pay

## 2023-02-24 ENCOUNTER — Telehealth: Payer: Self-pay

## 2023-02-24 NOTE — Telephone Encounter (Signed)
*  Primary  Pharmacy Patient Advocate Encounter   Received notification from CoverMyMeds that prior authorization for Ozempic (1 MG/DOSE) 4MG /3ML pen-injectors  is required/requested.   Insurance verification completed.   The patient is insured through [facility] .   Per test claim: PA required; PA submitted to [facility] via CoverMyMeds Key/confirmation #/EOC [id number] Status is pending

## 2023-02-27 ENCOUNTER — Other Ambulatory Visit (HOSPITAL_COMMUNITY): Payer: Self-pay

## 2023-03-02 ENCOUNTER — Other Ambulatory Visit: Payer: Self-pay | Admitting: Internal Medicine

## 2023-03-13 ENCOUNTER — Other Ambulatory Visit: Payer: Self-pay | Admitting: Internal Medicine

## 2023-03-16 ENCOUNTER — Other Ambulatory Visit: Payer: Self-pay | Admitting: Internal Medicine

## 2023-03-20 ENCOUNTER — Other Ambulatory Visit: Payer: Self-pay | Admitting: Internal Medicine

## 2023-04-07 NOTE — Telephone Encounter (Signed)
Orig request has been denied even with documentation of an A1C higher then 6.5.  Trying to resubmit with CMM Key: [facility]

## 2023-04-10 ENCOUNTER — Other Ambulatory Visit: Payer: Self-pay | Admitting: Internal Medicine

## 2023-04-13 ENCOUNTER — Telehealth: Payer: Self-pay | Admitting: Internal Medicine

## 2023-04-13 NOTE — Telephone Encounter (Signed)
Patient called [facility] and told them that her Pregablin was stolen.  [facility] would like [facility] to [facility] [place].

## 2023-04-14 MED ORDER — PREGABALIN 100 MG PO CAPS: 100.0000 mg | ORAL_CAPSULE | Freq: Three times a day (TID) | ORAL | 0 refills | Status: DC

## 2023-04-20 DIAGNOSIS — H04123 Dry eye syndrome of bilateral lacrimal glands: Secondary | ICD-10-CM | POA: Diagnosis not present

## 2023-04-20 DIAGNOSIS — E119 Type 2 diabetes mellitus without complications: Secondary | ICD-10-CM | POA: Diagnosis not present

## 2023-04-20 DIAGNOSIS — H25812 Combined forms of age-related cataract, left eye: Secondary | ICD-10-CM | POA: Diagnosis not present

## 2023-04-20 DIAGNOSIS — Z961 Presence of intraocular lens: Secondary | ICD-10-CM | POA: Diagnosis not present

## 2023-04-20 DIAGNOSIS — H1013 Acute atopic conjunctivitis, bilateral: Secondary | ICD-10-CM | POA: Diagnosis not present

## 2023-04-21 NOTE — Telephone Encounter (Signed)
Pharmacy Patient Advocate Encounter  Received notification from [facility] that Prior Authorization for Ozempic (1 MG/DOSE) 4MG /3ML pen-injectors has been APPROVED from 03-23-2023 to 03-22-2026   PA #/Case ID/Reference #: [id number]

## 2023-05-01 ENCOUNTER — Encounter (HOSPITAL_COMMUNITY): Payer: Self-pay | Admitting: Emergency Medicine

## 2023-05-01 ENCOUNTER — Other Ambulatory Visit: Payer: Self-pay

## 2023-05-01 ENCOUNTER — Inpatient Hospital Stay (HOSPITAL_COMMUNITY)
Admission: EM | Admit: 2023-05-01 | Discharge: 2023-05-04 | DRG: 392 | Disposition: A | Discharge status: Home / Self Care | Payer: PPO | Attending: Internal Medicine | Admitting: Internal Medicine

## 2023-05-01 ENCOUNTER — Emergency Department (HOSPITAL_COMMUNITY): Payer: PPO

## 2023-05-01 DIAGNOSIS — Z96652 Presence of left artificial knee joint: Secondary | ICD-10-CM | POA: Diagnosis present

## 2023-05-01 DIAGNOSIS — Z888 Allergy status to other drugs, medicaments and biological substances status: Secondary | ICD-10-CM

## 2023-05-01 DIAGNOSIS — Z833 Family history of diabetes mellitus: Secondary | ICD-10-CM

## 2023-05-01 DIAGNOSIS — F419 Anxiety disorder, unspecified: Secondary | ICD-10-CM | POA: Diagnosis present

## 2023-05-01 DIAGNOSIS — Z8261 Family history of arthritis: Secondary | ICD-10-CM

## 2023-05-01 DIAGNOSIS — H9113 Presbycusis, bilateral: Secondary | ICD-10-CM | POA: Diagnosis present

## 2023-05-01 DIAGNOSIS — R1012 Left upper quadrant pain: Secondary | ICD-10-CM | POA: Diagnosis not present

## 2023-05-01 DIAGNOSIS — Z90711 Acquired absence of uterus with remaining cervical stump: Secondary | ICD-10-CM

## 2023-05-01 DIAGNOSIS — Z880 Allergy status to penicillin: Secondary | ICD-10-CM

## 2023-05-01 DIAGNOSIS — K5732 Diverticulitis of large intestine without perforation or abscess without bleeding: Principal | ICD-10-CM | POA: Diagnosis present

## 2023-05-01 DIAGNOSIS — Z1152 Encounter for screening for COVID-19: Secondary | ICD-10-CM

## 2023-05-01 DIAGNOSIS — E785 Hyperlipidemia, unspecified: Secondary | ICD-10-CM | POA: Diagnosis present

## 2023-05-01 DIAGNOSIS — Z7901 Long term (current) use of anticoagulants: Secondary | ICD-10-CM

## 2023-05-01 DIAGNOSIS — Z87891 Personal history of nicotine dependence: Secondary | ICD-10-CM

## 2023-05-01 DIAGNOSIS — K5730 Diverticulosis of large intestine without perforation or abscess without bleeding: Secondary | ICD-10-CM | POA: Diagnosis not present

## 2023-05-01 DIAGNOSIS — I10 Essential (primary) hypertension: Secondary | ICD-10-CM | POA: Diagnosis present

## 2023-05-01 DIAGNOSIS — Z9049 Acquired absence of other specified parts of digestive tract: Secondary | ICD-10-CM

## 2023-05-01 DIAGNOSIS — I2510 Atherosclerotic heart disease of native coronary artery without angina pectoris: Secondary | ICD-10-CM | POA: Diagnosis present

## 2023-05-01 DIAGNOSIS — E876 Hypokalemia: Secondary | ICD-10-CM | POA: Diagnosis present

## 2023-05-01 DIAGNOSIS — Z79899 Other long term (current) drug therapy: Secondary | ICD-10-CM

## 2023-05-01 DIAGNOSIS — E119 Type 2 diabetes mellitus without complications: Secondary | ICD-10-CM

## 2023-05-01 DIAGNOSIS — K589 Irritable bowel syndrome without diarrhea: Secondary | ICD-10-CM | POA: Diagnosis present

## 2023-05-01 DIAGNOSIS — Z8249 Family history of ischemic heart disease and other diseases of the circulatory system: Secondary | ICD-10-CM

## 2023-05-01 DIAGNOSIS — E1140 Type 2 diabetes mellitus with diabetic neuropathy, unspecified: Secondary | ICD-10-CM

## 2023-05-01 DIAGNOSIS — F418 Other specified anxiety disorders: Secondary | ICD-10-CM | POA: Diagnosis present

## 2023-05-01 DIAGNOSIS — K5792 Diverticulitis of intestine, part unspecified, without perforation or abscess without bleeding: Secondary | ICD-10-CM | POA: Diagnosis not present

## 2023-05-01 DIAGNOSIS — G40909 Epilepsy, unspecified, not intractable, without status epilepticus: Secondary | ICD-10-CM

## 2023-05-01 DIAGNOSIS — Z7982 Long term (current) use of aspirin: Secondary | ICD-10-CM

## 2023-05-01 DIAGNOSIS — G4733 Obstructive sleep apnea (adult) (pediatric): Secondary | ICD-10-CM | POA: Diagnosis present

## 2023-05-01 LAB — COMPREHENSIVE METABOLIC PANEL
ALT: 21 U/L (ref 0–44)
AST: 29 U/L (ref 15–41)
Albumin: 3.9 g/dL (ref 3.5–5.0)
Alkaline Phosphatase: 46 U/L (ref 38–126)
Anion gap: 11 (ref 5–15)
BUN: 10 mg/dL (ref 8–23)
CO2: 24 mmol/L (ref 22–32)
Calcium: 9.8 mg/dL (ref 8.9–10.3)
Chloride: 100 mmol/L (ref 98–111)
Creatinine, Ser: 0.78 mg/dL (ref 0.44–1.00)
GFR, Estimated: >60 mL/min (ref >60)
Glucose, Bld: 91 mg/dL (ref 70–99)
Potassium: 3.8 mmol/L (ref 3.5–5.1)
Sodium: 135 mmol/L (ref 135–145)
Total Bilirubin: 0.7 mg/dL (ref <1.2)
Total Protein: 7.6 g/dL (ref 6.5–8.1)

## 2023-05-01 LAB — CBC WITH DIFFERENTIAL/PLATELET
Abs Immature Granulocytes: 0.03 10*3/uL (ref 0.00–0.07)
Basophils Absolute: 0 10*3/uL (ref 0.0–0.1)
Basophils Relative: 0 %
Eosinophils Absolute: 0 10*3/uL (ref 0.0–0.5)
Eosinophils Relative: 0 %
HCT: 38.4 % (ref 36.0–46.0)
Hemoglobin: 12.9 g/dL (ref 12.0–15.0)
Immature Granulocytes: 0 %
Lymphocytes Relative: 29 %
Lymphs Abs: 2.7 10*3/uL (ref 0.7–4.0)
MCH: 30 pg (ref 26.0–34.0)
MCHC: 33.6 g/dL (ref 30.0–36.0)
MCV: 89.3 fL (ref 80.0–100.0)
Monocytes Absolute: 0.7 10*3/uL (ref 0.1–1.0)
Monocytes Relative: 8 %
Neutro Abs: 5.8 10*3/uL (ref 1.7–7.7)
Neutrophils Relative %: 63 %
Platelets: 250 10*3/uL (ref 150–400)
RBC: 4.3 MIL/uL (ref 3.87–5.11)
RDW: 13.3 % (ref 11.5–15.5)
WBC: 9.2 10*3/uL (ref 4.0–10.5)
nRBC: 0 % (ref 0.0–0.2)

## 2023-05-01 LAB — RESP PANEL BY RT-PCR (RSV, FLU A&B, COVID)  RVPGX2
Influenza A by PCR: NEGATIVE
Influenza B by PCR: NEGATIVE
Resp Syncytial Virus by PCR: NEGATIVE
SARS Coronavirus 2 by RT PCR: NEGATIVE

## 2023-05-01 LAB — CBG MONITORING, ED: Glucose-Capillary: 86 mg/dL (ref 70–99)

## 2023-05-01 LAB — LIPASE, BLOOD: Lipase: 37 U/L (ref 11–51)

## 2023-05-01 MED ORDER — METRONIDAZOLE 500 MG/100ML IV SOLN: 500.0000 mg | Freq: Once | INTRAVENOUS | Status: DC

## 2023-05-01 MED ORDER — ONDANSETRON HCL 4 MG/2ML IJ SOLN
4.0000 mg | Freq: Once | INTRAMUSCULAR | Status: AC
  Administered 2023-05-01: 4 mg via INTRAVENOUS
  Filled 2023-05-01: qty 2

## 2023-05-01 MED ORDER — FENTANYL CITRATE PF 50 MCG/ML IJ SOSY
50.0000 ug | PREFILLED_SYRINGE | Freq: Once | INTRAMUSCULAR | Status: AC
  Administered 2023-05-01: 50 ug via INTRAVENOUS
  Filled 2023-05-01: qty 1

## 2023-05-01 MED ORDER — ACETAMINOPHEN 500 MG PO TABS
1000.0000 mg | ORAL_TABLET | Freq: Once | ORAL | Status: AC
  Administered 2023-05-01: 1000 mg via ORAL
  Filled 2023-05-01: qty 2

## 2023-05-01 MED ORDER — SODIUM CHLORIDE 0.9 % IV SOLN: 2.0000 g | Freq: Once | INTRAVENOUS | Status: DC

## 2023-05-01 MED ORDER — IOHEXOL 350 MG/ML SOLN
75.0000 mL | Freq: Once | INTRAVENOUS | Status: AC | PRN
  Administered 2023-05-01: 75 mL via INTRAVENOUS

## 2023-05-01 MED ORDER — METRONIDAZOLE 500 MG/100ML IV SOLN
500.0000 mg | Freq: Once | INTRAVENOUS | Status: AC
  Administered 2023-05-02: 500 mg via INTRAVENOUS
  Filled 2023-05-01: qty 100

## 2023-05-01 MED ORDER — CIPROFLOXACIN IN D5W 400 MG/200ML IV SOLN
400.0000 mg | Freq: Once | INTRAVENOUS | Status: AC
  Administered 2023-05-01: 400 mg via INTRAVENOUS
  Filled 2023-05-01: qty 200

## 2023-05-01 NOTE — Progress Notes (Signed)
This encounter was created in error - please disregard.  I called patient and she was in pain.  Her daughter was on the way to take her to the ER.  Patient will call office back to reschedule.

## 2023-05-01 NOTE — ED Provider Triage Note (Signed)
Emergency Medicine Provider Triage Evaluation Note  Ariele Vidrio , a 82 y.o. female  was evaluated in triage.  Pt complains of abdominal pain.  Review of Systems  Positive: LLQ pain, fever, decreased appetitive, NV Negative: Chest pain, shortness of breath  Physical Exam  BP 129/66   Pulse 82   Temp (!) 101.1 F (38.4 C) (Oral)   Resp 18   SpO2 100%  Gen:   Awake, no distress   Resp:  Normal effort  MSK:   Moves extremities without difficulty  Other:  Abdominal pain LLQ  Medical Decision Making  Medically screening exam initiated at 7:33 PM.  Appropriate orders placed.  Marybella Ethier was informed that the remainder of the evaluation will be completed by another provider, this initial triage assessment does not replace that evaluation, and the importance of remaining in the ED until their evaluation is complete.  Patient with past medical history of diabetes taking Ozempic once weekly presenting to emergency room with left lower quadrant pain, fever, decreased appetite nausea vomiting.  Patient does not have increased weakness but feels pretty fatigued.     Smitty Knudsen, [place] 05/01/23 [id number]

## 2023-05-01 NOTE — ED Triage Notes (Signed)
Pt reports LLQ abdominal pain and "pain all over my whole body." Denies n/v/d.

## 2023-05-01 NOTE — ED Provider Notes (Signed)
[place] EMERGENCY DEPARTMENT AT [facility] Provider Note   CSN: [id number] Arrival date & time: 05/01/23  1639     History {Add pertinent medical, surgical, social history, OB history to HPI:1} Chief Complaint  Patient presents with   Abdominal Pain    [name] is a 82 y.o. female.  The history is provided by the patient and a relative.  Abdominal Pain Pain location:  Generalized Pain severity:  Moderate Onset quality:  Gradual Timing:  Constant Progression:  Worsening Chronicity:  New Relieved by:  Nothing Associated symptoms: fever and nausea   Associated symptoms: no hematemesis, no hematochezia and no vomiting   Patient reports over the past 2 days she has had increasing abdominal pain and full body aches.  She also suspect she has a fever.  She has nausea but no vomiting.  Last bowel movement 2 days ago, no bloody or black stools.  No diarrhea. She has multiple distant abdominal surgeries including cholecystectomy, appendectomy and hysterectomy    Past Medical History:  Diagnosis Date   Abdominal distension, gaseous 04/10/2017   Allergic rhinitis 03/13/2013   CAD (coronary artery disease) 05/28/2018   Cerebrovascular disease    Chest pain 10/12/2016   Degeneration of lumbar intervertebral disc 08/02/2017   Diabetic neuropathy 05/12/2014   Did not tolerated cymbalta - hives, itching Tried nortriptyline - not effective Did not tolerate Effexor-nausea gabapentin-not effective    Diverticulitis of large intestine 05/12/2014   Dyslipidemia    Gastroesophageal reflux disease 03/18/2009   Hearing loss 02/01/2017   Hypercalcemia 10/12/2016   Hypertension    Hypokalemia 01/06/2020   Insomnia 03/20/2009   Irritable bowel syndrome 03/20/2009   With chronic constipation   Laryngopharyngeal reflux (LPR) 09/27/2016   Lumbar pain 08/23/2016   Mild neurocognitive disorder due to multiple etiologies 11/06/2020   Myofascial pain 05/19/2015   Neck pain 09/27/2016    Non-toxic multinodular goiter 05/18/2009   Obstructive sleep apnea 03/18/2009   Not using CPAP   Osteoarthritis of left knee 01/12/2017   Osteoarthritis of shoulder region    Pain in joint of right shoulder 01/21/2019   Presbycusis of both ears 09/27/2016   Seizure disorder    Situational anxiety    Spondylolisthesis 09/20/2017   Steatosis of liver 05/12/2014   Type 2 diabetes mellitus 05/12/2014   Unstable angina 01/05/2020   Vertigo 04/13/2009    Home Medications Prior to Admission medications   Medication Sig Start Date End Date Taking? Authorizing Provider  acetaminophen (TYLENOL) 500 MG tablet Take 500 mg by mouth every 6 (six) hours as needed for headache (pain).    [provider]  albuterol (VENTOLIN HFA) 108 (90 Base) MCG/ACT inhaler Inhale 2 puffs into the lungs every 6 (six) hours as needed for wheezing or shortness of breath.    [provider]  aspirin EC 81 MG tablet Take 1 tablet (81 mg total) by mouth daily. 04/17/18   Lyn Records, MD  clonazePAM (KLONOPIN) 0.5 MG tablet TAKE 1/2 TO 1 TABLET TWICE A DAY AS NEEDED FOR ANXIETY 11/29/21   Burns, Bobette Mo, MD  Continuous Glucose Sensor (FREESTYLE LIBRE 2 SENSOR) MISC APPLY NEW SENSOR EVERY 14 DAYS TO MONITOR BLOOD GLUCOSE.REMOVE OLD SENSOR BEFORE APPLYING NEW ONE 03/13/23   Pincus Sanes, MD  dicyclomine (BENTYL) 10 MG capsule TAKE 1 CAPSULE BY MOUTH 3 TIMES DAILY BEFORE MEALS *PLEASE SCHEDULE APPOINTMENT FOR FURTHER REFILLS* *REFILL REQUEST* 02/22/23   Meryl Dare, MD  docusate sodium (COLACE)  100 MG capsule Take 3 capsules (300 mg total) by mouth daily. 11/02/22   Pincus Sanes, MD  ezetimibe (ZETIA) 10 MG tablet TAKE ONE TABLET BY MOUTH ONCE DAILY 11/24/22   Pincus Sanes, MD  famotidine (PEPCID) 40 MG tablet TAKE ONE TABLET BY MOUTH DAILY 01/23/23   Pincus Sanes, MD  fluconazole (DIFLUCAN) 150 MG tablet TAKE 1 TABLET BY MOUTH EVERY 3 DAYS *REFILL REQUEST* 02/22/23   Burns, Bobette Mo, MD  fluticasone  (FLONASE) 50 MCG/ACT nasal spray Place 2 sprays into both nostrils in the morning and at bedtime.    [provider]  glucose blood (ONETOUCH ULTRA) test strip USE UP TO 4 TIMES DAILY AS DIRECTED 10/15/19   Pincus Sanes, MD  ibuprofen (ADVIL) 600 MG tablet Take 1 tablet (600 mg total) by mouth every 6 (six) hours as needed (alternate with acetaminophen). 08/09/21   Sloan Leiter, DO  Lancets MISC Use to check blood sugars up to 4 times daily as directed. E11.9 10/15/19   Pincus Sanes, MD  lidocaine (LIDODERM) 5 % Place 1 patch onto the skin daily as needed. Remove & Discard patch within 12 hours or as directed by MD 08/09/21   Tanda Rockers A, DO  lubiprostone ([name]) 24 MCG capsule TAKE ONE CAPSULE BY MOUTH TWICE DAILY WITH A MEAL 01/25/23   Burns, Bobette Mo, MD  meclizine (ANTIVERT) 12.5 MG tablet TAKE 1 TABLET BY MOUTH THREE TIMES A DAY AS NEEDED FOR DIZZINESS 06/03/22   Pincus Sanes, MD  Multiple Vitamin (MULTIVITAMIN WITH MINERALS) TABS tablet Take 1 tablet by mouth daily after lunch.    [provider]  nitroGLYCERIN (NITROSTAT) 0.4 MG SL tablet DISSOLVE 1 TABLET UNDER THE TONGUE AS NEEDED FOR CHEST PAIN EVERY 5 MINUTES UP TO 3 TIMES. IF NO RELIEF CALL 911. *REFILL REQUEST* 02/22/23   Pincus Sanes, MD  omeprazole (PRILOSEC) 40 MG capsule TAKE ONE CAPSULE BY MOUTH ONCE DAILY 01/23/23   Pincus Sanes, MD  OVER THE COUNTER MEDICATION Place 1 drop into both eyes 2 (two) times daily as needed (dry eyes/ pataday). Over the counter eye drop for itching    [provider]  OXcarbazepine (TRILEPTAL) 150 MG tablet TAKE 1 TABLET BY MOUTH TWICE DAILY 03/20/23   Burns, Bobette Mo, MD  OZEMPIC, 1 MG/DOSE, 4 MG/3ML SOPN INJECT 1MG  SUBCUTANEOUSLY ONCE WEEKLY *REFILL REQUEST* 02/17/23   Pincus Sanes, MD  Polyvinyl Alcohol-Povidone (REFRESH OP) Place 1 drop into both eyes daily as needed (dry eyes).    [provider]  potassium chloride SA (KLOR-CON M) 20 MEQ tablet TAKE 1 TABLET  BY MOUTH 3 TIMES DAILY 03/02/23   Pincus Sanes, MD  pregabalin (LYRICA) 100 MG capsule Take 1 capsule (100 mg total) by mouth 3 (three) times daily. 04/14/23   Pincus Sanes, MD  Probiotic Product (PROBIOTIC PO) Take 1 capsule by mouth daily after breakfast.    [provider]  rosuvastatin (CRESTOR) 5 MG tablet TAKE ONE TABLET BY MOUTH EVERYDAY AT BEDTIME 08/17/20   Burns, Bobette Mo, MD  terconazole (TERAZOL 3) 0.8 % vaginal cream INSERT 1 APPLICATORFUL VAGINALLY FOR 3 DAYS AS NEEDED 03/16/23   Pincus Sanes, MD  Calcium Carbonate (CALCIUM 500 PO) Take 1 capsule by mouth every other day.   09/26/14  [provider]      Allergies    Cymbalta [duloxetine hcl], Effexor [venlafaxine], Linzess [linaclotide], Other, Statins, and Penicillins    Review  of Systems   Review of Systems  Constitutional:  Positive for fever.  Gastrointestinal:  Positive for abdominal pain and nausea. Negative for hematemesis, hematochezia and vomiting.  Musculoskeletal:  Positive for myalgias.    Physical Exam Updated Vital Signs BP 116/61   Pulse 74   Temp 99 F (37.2 C)   Resp 16   SpO2 98%  Physical Exam CONSTITUTIONAL: Elderly, uncomfortable appearing HEAD: Normocephalic/atraumatic EYES: EOMI/PERRL ENMT: Mucous membranes moist NECK: supple no meningeal signs CV: S1/S2 noted, no murmurs/rubs/gallops noted LUNGS: Lungs are clear to auscultation bilaterally, no apparent distress ABDOMEN: soft, moderate LLQ tenderness, no rebound or guarding, bowel sounds noted throughout abdomen GU:no cva tenderness NEURO: Pt is awake/alert/appropriate, moves all extremitiesx4.  No facial droop.   EXTREMITIES: pulses normal/equal, full ROM SKIN: warm, color normal PSYCH: Anxious  ED Results / Procedures / Treatments   Labs (all labs ordered are listed, but only abnormal results are displayed) Labs Reviewed  RESP PANEL BY RT-PCR (RSV, FLU A&B, COVID)  [id number]  COMPREHENSIVE METABOLIC PANEL  CBC WITH  DIFFERENTIAL/PLATELET  LIPASE, BLOOD  URINALYSIS, ROUTINE W REFLEX MICROSCOPIC  CBG MONITORING, ED    EKG None  Radiology CT ABDOMEN PELVIS W CONTRAST  Result Date: 05/01/2023 CLINICAL DATA:  Left upper quadrant pain EXAM: CT ABDOMEN AND PELVIS WITH CONTRAST TECHNIQUE: Multidetector CT imaging of the abdomen and pelvis was performed using the standard protocol following bolus administration of intravenous contrast. RADIATION DOSE REDUCTION: This exam was performed according to the departmental dose-optimization program which includes automated exposure control, adjustment of the mA and/or kV according to patient size and/or use of iterative reconstruction technique. CONTRAST:  75mL OMNIPAQUE IOHEXOL 350 MG/ML SOLN COMPARISON:  None Available. FINDINGS: Lower chest: No acute abnormality Hepatobiliary: No focal liver abnormality is seen. Status post cholecystectomy. No biliary dilatation. Pancreas: No focal abnormality or ductal dilatation. Spleen: No focal abnormality.  Normal size. Adrenals/Urinary Tract: No adrenal abnormality. No focal renal abnormality. No stones or hydronephrosis. Urinary bladder is unremarkable. Stomach/Bowel: Colonic diverticulosis. Slight haziness/inflammation around the proximal sigmoid colon compatible with early active diverticulitis. Stomach and small bowel decompressed, unremarkable. Vascular/Lymphatic: Aortic atherosclerosis. No evidence of aneurysm or adenopathy. Reproductive: Prior hysterectomy.  No adnexal masses. Other: No free fluid or free air. Musculoskeletal: No acute bony abnormality. IMPRESSION: Colonic diverticulosis. Subtle inflammation around the proximal sigmoid colon compatible with early active diverticulitis. Aortic atherosclerosis. Electronically Signed   By: Charlett Nose M.D.   On: 05/01/2023 21:33    Procedures Procedures  {Document cardiac monitor, telemetry assessment procedure when appropriate:1}  Medications Ordered in ED Medications   ciprofloxacin (CIPRO) IVPB 400 mg (400 mg Intravenous New Bag/Given 05/01/23 2337)    And  metroNIDAZOLE (FLAGYL) IVPB 500 mg (has no administration in time range)  acetaminophen (TYLENOL) tablet 1,000 mg (1,000 mg Oral Given 05/01/23 1940)  iohexol (OMNIPAQUE) 350 MG/ML injection 75 mL (75 mLs Intravenous Contrast Given 05/01/23 2105)  fentaNYL (SUBLIMAZE) injection 50 mcg (50 mcg Intravenous Given 05/01/23 2331)  ondansetron (ZOFRAN) injection 4 mg (4 mg Intravenous Given 05/01/23 2331)    ED Course/ Medical Decision Making/ A&P   {   Click here for ABCD2, HEART and other calculatorsREFRESH Note before signing :1}                              Medical Decision Making Risk Prescription drug management. Decision regarding hospitalization.   This patient presents to the ED for concern of  abdominal pain, this involves an extensive number of treatment options, and is a complaint that carries with it a high risk of complications and morbidity.  The differential diagnosis includes but is not limited to pancreatitis, gastritis, peptic ulcer disease, bowel obstruction, bowel perforation, diverticulitis, AAA, ischemic bowel   Comorbidities that complicate the patient evaluation: Patient's presentation is complicated by their history of diabetes  Additional history obtained: Additional history obtained from family Records reviewed Primary Care Documents  Lab Tests: I Ordered, and personally interpreted labs.  The pertinent results include: Labs overall unremarkable  Imaging Studies ordered: I ordered imaging studies including CT scan abdomen pelvis   I independently visualized and interpreted imaging which showed diverticulitis I agree with the radiologist interpretation   Medicines ordered and prescription drug management: I ordered medication including fentanyl for pain Reevaluation of the patient after these medicines showed that the patient     {resolved/improved/worsened:23923::"improved"}  Test Considered: Patient is low risk / negative by ***, therefore do not feel that *** is indicated.  Critical Interventions:  ***  Consultations Obtained: I requested consultation with the {consultation:[id number]}, and discussed  findings as well as pertinent plan - they recommend: ***  Reevaluation: After the interventions noted above, I reevaluated the patient and found that they have :stayed the same  Complexity of problems addressed: Patient's presentation is most consistent with  acute presentation with potential threat to life or bodily function  Disposition: After consideration of the diagnostic results and the patient's response to treatment,  I feel that the patent would benefit from admission   .     {Document critical care time when appropriate:1} {Document review of labs and clinical decision tools ie heart score, Chads2Vasc2 etc:1}  {Document your independent review of radiology images, and any outside records:1} {Document your discussion with family members, caretakers, and with consultants:1} {Document social determinants of health affecting pt's care:1} {Document your decision making why or why not admission, treatments were needed:1} Final Clinical Impression(s) / ED Diagnoses Final diagnoses:  Diverticulitis    Rx / DC Orders ED Discharge Orders     None

## 2023-05-02 ENCOUNTER — Ambulatory Visit: Payer: PPO | Admitting: Internal Medicine

## 2023-05-02 DIAGNOSIS — K5792 Diverticulitis of intestine, part unspecified, without perforation or abscess without bleeding: Secondary | ICD-10-CM | POA: Diagnosis not present

## 2023-05-02 DIAGNOSIS — I10 Essential (primary) hypertension: Secondary | ICD-10-CM | POA: Diagnosis not present

## 2023-05-02 DIAGNOSIS — Z8261 Family history of arthritis: Secondary | ICD-10-CM | POA: Diagnosis not present

## 2023-05-02 DIAGNOSIS — I2510 Atherosclerotic heart disease of native coronary artery without angina pectoris: Secondary | ICD-10-CM

## 2023-05-02 DIAGNOSIS — G40909 Epilepsy, unspecified, not intractable, without status epilepticus: Secondary | ICD-10-CM | POA: Diagnosis not present

## 2023-05-02 DIAGNOSIS — Z87891 Personal history of nicotine dependence: Secondary | ICD-10-CM | POA: Diagnosis not present

## 2023-05-02 DIAGNOSIS — G4733 Obstructive sleep apnea (adult) (pediatric): Secondary | ICD-10-CM | POA: Diagnosis not present

## 2023-05-02 DIAGNOSIS — K5732 Diverticulitis of large intestine without perforation or abscess without bleeding: Secondary | ICD-10-CM | POA: Diagnosis present

## 2023-05-02 DIAGNOSIS — F418 Other specified anxiety disorders: Secondary | ICD-10-CM | POA: Diagnosis not present

## 2023-05-02 DIAGNOSIS — K5730 Diverticulosis of large intestine without perforation or abscess without bleeding: Secondary | ICD-10-CM | POA: Diagnosis not present

## 2023-05-02 DIAGNOSIS — Z9049 Acquired absence of other specified parts of digestive tract: Secondary | ICD-10-CM | POA: Diagnosis not present

## 2023-05-02 DIAGNOSIS — Z833 Family history of diabetes mellitus: Secondary | ICD-10-CM | POA: Diagnosis not present

## 2023-05-02 DIAGNOSIS — E1142 Type 2 diabetes mellitus with diabetic polyneuropathy: Secondary | ICD-10-CM | POA: Diagnosis not present

## 2023-05-02 DIAGNOSIS — E785 Hyperlipidemia, unspecified: Secondary | ICD-10-CM | POA: Diagnosis not present

## 2023-05-02 DIAGNOSIS — Z79899 Other long term (current) drug therapy: Secondary | ICD-10-CM | POA: Diagnosis not present

## 2023-05-02 DIAGNOSIS — E1140 Type 2 diabetes mellitus with diabetic neuropathy, unspecified: Secondary | ICD-10-CM | POA: Diagnosis not present

## 2023-05-02 DIAGNOSIS — R1012 Left upper quadrant pain: Secondary | ICD-10-CM | POA: Diagnosis not present

## 2023-05-02 DIAGNOSIS — Z8249 Family history of ischemic heart disease and other diseases of the circulatory system: Secondary | ICD-10-CM | POA: Diagnosis not present

## 2023-05-02 DIAGNOSIS — E876 Hypokalemia: Secondary | ICD-10-CM | POA: Diagnosis not present

## 2023-05-02 DIAGNOSIS — Z1152 Encounter for screening for COVID-19: Secondary | ICD-10-CM | POA: Diagnosis not present

## 2023-05-02 DIAGNOSIS — Z96652 Presence of left artificial knee joint: Secondary | ICD-10-CM | POA: Diagnosis not present

## 2023-05-02 DIAGNOSIS — Z7901 Long term (current) use of anticoagulants: Secondary | ICD-10-CM | POA: Diagnosis not present

## 2023-05-02 DIAGNOSIS — K589 Irritable bowel syndrome without diarrhea: Secondary | ICD-10-CM | POA: Diagnosis not present

## 2023-05-02 DIAGNOSIS — Z880 Allergy status to penicillin: Secondary | ICD-10-CM | POA: Diagnosis not present

## 2023-05-02 DIAGNOSIS — F419 Anxiety disorder, unspecified: Secondary | ICD-10-CM | POA: Diagnosis not present

## 2023-05-02 DIAGNOSIS — Z90711 Acquired absence of uterus with remaining cervical stump: Secondary | ICD-10-CM | POA: Diagnosis not present

## 2023-05-02 DIAGNOSIS — H9113 Presbycusis, bilateral: Secondary | ICD-10-CM | POA: Diagnosis not present

## 2023-05-02 DIAGNOSIS — Z888 Allergy status to other drugs, medicaments and biological substances status: Secondary | ICD-10-CM | POA: Diagnosis not present

## 2023-05-02 DIAGNOSIS — Z7982 Long term (current) use of aspirin: Secondary | ICD-10-CM | POA: Diagnosis not present

## 2023-05-02 LAB — BASIC METABOLIC PANEL
Anion gap: 10 (ref 5–15)
BUN: 7 mg/dL — ABNORMAL LOW (ref 8–23)
CO2: 25 mmol/L (ref 22–32)
Calcium: 9.3 mg/dL (ref 8.9–10.3)
Chloride: 102 mmol/L (ref 98–111)
Creatinine, Ser: 0.7 mg/dL (ref 0.44–1.00)
GFR, Estimated: >60 mL/min (ref >60)
Glucose, Bld: 99 mg/dL (ref 70–99)
Potassium: 3.3 mmol/L — ABNORMAL LOW (ref 3.5–5.1)
Sodium: 137 mmol/L (ref 135–145)

## 2023-05-02 LAB — CBC
HCT: 35 % — ABNORMAL LOW (ref 36.0–46.0)
Hemoglobin: 11.7 g/dL — ABNORMAL LOW (ref 12.0–15.0)
MCH: 29.3 pg (ref 26.0–34.0)
MCHC: 33.4 g/dL (ref 30.0–36.0)
MCV: 87.5 fL (ref 80.0–100.0)
Platelets: 218 10*3/uL (ref 150–400)
RBC: 4 MIL/uL (ref 3.87–5.11)
RDW: 13.5 % (ref 11.5–15.5)
WBC: 7.3 10*3/uL (ref 4.0–10.5)
nRBC: 0 % (ref 0.0–0.2)

## 2023-05-02 LAB — MAGNESIUM: Magnesium: 1.8 mg/dL (ref 1.7–2.4)

## 2023-05-02 LAB — GLUCOSE, CAPILLARY
Glucose-Capillary: 103 mg/dL — ABNORMAL HIGH (ref 70–99)
Glucose-Capillary: 110 mg/dL — ABNORMAL HIGH (ref 70–99)
Glucose-Capillary: 150 mg/dL — ABNORMAL HIGH (ref 70–99)
Glucose-Capillary: 118 mg/dL — ABNORMAL HIGH (ref 70–99)

## 2023-05-02 LAB — PHOSPHORUS: Phosphorus: 4 mg/dL (ref 2.5–4.6)

## 2023-05-02 LAB — CBG MONITORING, ED: Glucose-Capillary: 95 mg/dL (ref 70–99)

## 2023-05-02 MED ORDER — LEVOFLOXACIN IN D5W 500 MG/100ML IV SOLN
500.0000 mg | INTRAVENOUS | Status: DC
  Administered 2023-05-02 – 2023-05-04 (×3): 500 mg via INTRAVENOUS
  Filled 2023-05-02 (×3): qty 100

## 2023-05-02 MED ORDER — PREGABALIN 100 MG PO CAPS
100.0000 mg | ORAL_CAPSULE | Freq: Three times a day (TID) | ORAL | Status: DC
  Administered 2023-05-02 – 2023-05-04 (×7): 100 mg via ORAL
  Filled 2023-05-02 (×7): qty 1

## 2023-05-02 MED ORDER — CLONAZEPAM 0.5 MG PO TABS
0.5000 mg | ORAL_TABLET | Freq: Two times a day (BID) | ORAL | Status: DC | PRN
  Administered 2023-05-03: 0.5 mg via ORAL
  Filled 2023-05-02: qty 1

## 2023-05-02 MED ORDER — ENOXAPARIN SODIUM 40 MG/0.4ML IJ SOSY: 40.0000 mg | PREFILLED_SYRINGE | Freq: Every day | INTRAMUSCULAR | Status: DC

## 2023-05-02 MED ORDER — INSULIN ASPART 100 UNIT/ML IJ SOLN: 0.0000 [IU] | INTRAMUSCULAR | Status: DC

## 2023-05-02 MED ORDER — ACETAMINOPHEN 325 MG PO TABS
650.0000 mg | ORAL_TABLET | Freq: Four times a day (QID) | ORAL | Status: DC | PRN
  Administered 2023-05-02: 650 mg via ORAL
  Filled 2023-05-02: qty 2

## 2023-05-02 MED ORDER — ALBUTEROL SULFATE (2.5 MG/3ML) 0.083% IN NEBU: 3.0000 mL | INHALATION_SOLUTION | Freq: Four times a day (QID) | RESPIRATORY_TRACT | Status: DC | PRN

## 2023-05-02 MED ORDER — OXCARBAZEPINE 150 MG PO TABS
150.0000 mg | ORAL_TABLET | Freq: Two times a day (BID) | ORAL | Status: DC
  Administered 2023-05-02 – 2023-05-04 (×5): 150 mg via ORAL
  Filled 2023-05-02 (×6): qty 1

## 2023-05-02 MED ORDER — ACETAMINOPHEN 650 MG RE SUPP: 650.0000 mg | Freq: Four times a day (QID) | RECTAL | Status: DC | PRN

## 2023-05-02 MED ORDER — ENOXAPARIN SODIUM 40 MG/0.4ML IJ SOSY
40.0000 mg | PREFILLED_SYRINGE | Freq: Every day | INTRAMUSCULAR | Status: DC
Start: 2023-05-02 — End: 2023-05-04
  Administered 2023-05-02 – 2023-05-04 (×3): 40 mg via SUBCUTANEOUS
  Filled 2023-05-02 (×3): qty 0.4

## 2023-05-02 MED ORDER — ASPIRIN 81 MG PO TBEC
81.0000 mg | DELAYED_RELEASE_TABLET | Freq: Every day | ORAL | Status: DC
  Administered 2023-05-02 – 2023-05-04 (×3): 81 mg via ORAL
  Filled 2023-05-02 (×3): qty 1

## 2023-05-02 MED ORDER — FAMOTIDINE 20 MG PO TABS
40.0000 mg | ORAL_TABLET | Freq: Every day | ORAL | Status: DC
  Administered 2023-05-02 – 2023-05-04 (×3): 40 mg via ORAL
  Filled 2023-05-02 (×3): qty 2

## 2023-05-02 MED ORDER — INFLUENZA VAC A&B SURF ANT ADJ 0.5 ML IM SUSY
0.5000 mL | PREFILLED_SYRINGE | INTRAMUSCULAR | Status: DC
Start: 2023-05-03 — End: 2023-05-04
  Filled 2023-05-02: qty 0.5

## 2023-05-02 MED ORDER — METRONIDAZOLE 500 MG/100ML IV SOLN
500.0000 mg | Freq: Two times a day (BID) | INTRAVENOUS | Status: DC
  Administered 2023-05-02 – 2023-05-04 (×5): 500 mg via INTRAVENOUS
  Filled 2023-05-02 (×5): qty 100

## 2023-05-02 MED ORDER — ONDANSETRON HCL 4 MG/2ML IJ SOLN: 4.0000 mg | Freq: Four times a day (QID) | INTRAMUSCULAR | Status: DC | PRN

## 2023-05-02 MED ORDER — ONDANSETRON HCL 4 MG PO TABS
4.0000 mg | ORAL_TABLET | Freq: Four times a day (QID) | ORAL | Status: DC | PRN
  Administered 2023-05-02: 4 mg via ORAL
  Filled 2023-05-02: qty 1

## 2023-05-02 MED ORDER — OXYCODONE HCL 5 MG PO TABS
5.0000 mg | ORAL_TABLET | ORAL | Status: DC | PRN
  Administered 2023-05-02 – 2023-05-04 (×5): 5 mg via ORAL
  Filled 2023-05-02 (×5): qty 1

## 2023-05-02 MED ORDER — ROSUVASTATIN CALCIUM 5 MG PO TABS
5.0000 mg | ORAL_TABLET | Freq: Every day | ORAL | Status: DC
  Administered 2023-05-02 – 2023-05-03 (×2): 5 mg via ORAL
  Filled 2023-05-02 (×2): qty 1

## 2023-05-02 MED ORDER — FENTANYL CITRATE PF 50 MCG/ML IJ SOSY: 12.5000 ug | PREFILLED_SYRINGE | INTRAMUSCULAR | Status: DC | PRN

## 2023-05-02 MED ORDER — BOOST / RESOURCE BREEZE PO LIQD CUSTOM
1.0000 | Freq: Three times a day (TID) | ORAL | Status: DC
  Administered 2023-05-02 – 2023-05-03 (×6): 1 via ORAL

## 2023-05-02 NOTE — Progress Notes (Signed)
Pharmacy Antibiotic Note  [name] is a 82 y.o. female admitted on 05/01/2023 with concern for intra-abdominal infection. CT ab/pelv with evidence of diverticulitis. Noted to have penicillin allergy causing hives/rash with no history tolerating cephalosporins in the past. Pharmacy has been consulted for levofloxacin dosing.  Plan: Levofloxacin 500mg  q24h  F/u renal function, infectious work up and ability to transition to oral antibiotics     Temp (24hrs), Avg:99.6 F (37.6 C), Min:98.8 F (37.1 C), Max:101.1 F (38.4 C)  Recent Labs  Lab 05/01/23 1949  WBC 9.2  CREATININE 0.78    CrCl cannot be calculated (Unknown ideal weight.).    Allergies  Allergen Reactions   Cymbalta [Duloxetine Hcl] Hives and Itching   Effexor [Venlafaxine] Nausea Only   Hm Lidocaine Patch [Lidocaine] Other (See Comments)    Blisters the skin   Linzess [Linaclotide]     diarrhea   Other Other (See Comments)    "SEEDED" food due to stomach issues   Statins Nausea And Vomiting   Penicillins Hives and Rash    Has patient had a PCN reaction causing immediate rash, facial/tongue/throat swelling, SOB or lightheadedness with hypotension: Yes Has patient had a PCN reaction causing severe rash involving mucus membranes or skin necrosis: No Has patient had a PCN reaction that required hospitalization: No Has patient had a PCN reaction occurring within the last 10 years: Yes If all of the above answers are "NO", then may proceed with Cephalosporin use.     Antimicrobials this admission: Flagyl 11/5 > Cipro 11/4 x 1  Levofloxacin 11/5 >  Microbiology results: 11/4 resp panel: negative   Thank you for allowing pharmacy to be a part of this patient's care.  Marja Kays 05/02/2023 1:31 AM

## 2023-05-02 NOTE — Progress Notes (Signed)
Mobility Specialist Progress Note:    05/02/23 1440  Mobility  Activity Ambulated with assistance in hallway  Level of Assistance Contact guard assist, steadying assist  Assistive Device None  Distance Ambulated (ft) 150 ft  Activity Response Tolerated well  Mobility Referral Yes  $Mobility charge 1 Mobility  Mobility Specialist Start Time (ACUTE ONLY) 1420  Mobility Specialist Stop Time (ACUTE ONLY) 1440  Mobility Specialist Time Calculation (min) (ACUTE ONLY) 20 min   Pt received in bed, requesting assistance to go to bathroom. Ambulatedto bathroom, CGA for safety. Tolerated well, eager to ambulate in hallway. Asx throughout. Returned pt to room, all needs met, sitting up in chair with call bell in reach.    Feliciana Rossetti Mobility Specialist Please contact via Special educational needs teacher or  Rehab office at [phone]

## 2023-05-02 NOTE — Plan of Care (Signed)
Patient was seen and examined at bedside, still complaining of left lower quadrant abdominal pain, 9/10.  Denied any chest pain or palpitation no shortness of breath, we will continue current treatment and clear liquid diet for now.

## 2023-05-02 NOTE — ED Notes (Signed)
ED TO INPATIENT HANDOFF REPORT  ED Nurse Name and Phone #: [phone]  S Name/Age/Gender [name] 82 y.o. female Room/Bed: OTFC/OTF  Code Status   Code Status: Full Code  Home/SNF/Other Home Patient oriented to: self, place, time, and situation Is this baseline? Yes   Triage Complete: Triage complete  Chief Complaint Diverticulitis of sigmoid colon [K57.32]  Triage Note Pt reports LLQ abdominal pain and "pain all over my whole body." Denies n/v/d.   Allergies Allergies  Allergen Reactions   Cymbalta [Duloxetine Hcl] Hives and Itching   Effexor [Venlafaxine] Nausea Only   Hm Lidocaine Patch [Lidocaine] Other (See Comments)    Blisters the skin   Linzess [Linaclotide]     diarrhea   Other Other (See Comments)    "SEEDED" food due to stomach issues   Statins Nausea And Vomiting   Penicillins Hives and Rash    Has patient had a PCN reaction causing immediate rash, facial/tongue/throat swelling, SOB or lightheadedness with hypotension: Yes Has patient had a PCN reaction causing severe rash involving mucus membranes or skin necrosis: No Has patient had a PCN reaction that required hospitalization: No Has patient had a PCN reaction occurring within the last 10 years: Yes If all of the above answers are "NO", then may proceed with Cephalosporin use.     Level of Care/Admitting Diagnosis ED Disposition     ED Disposition  Admit   Condition  --   Comment  Hospital Area: [facility] [[id number]]  Level of Care: Med-Surg [16]  May admit patient to [facility] or [facility] if equivalent level of care is available:: Yes  Covid Evaluation: Asymptomatic - no recent exposure (last 10 days) testing not required  Diagnosis: Diverticulitis of sigmoid colon [657846]  Admitting Physician: Briscoe Deutscher [[id number]]  Attending Physician: Briscoe Deutscher [[id number]]  Certification:: I certify this patient will need inpatient services for at least 2  midnights  Expected Medical Readiness: 05/04/2023          B Medical/Surgery History Past Medical History:  Diagnosis Date   Abdominal distension, gaseous 04/10/2017   Allergic rhinitis 03/13/2013   CAD (coronary artery disease) 05/28/2018   Cerebrovascular disease    Chest pain 10/12/2016   Degeneration of lumbar intervertebral disc 08/02/2017   Diabetic neuropathy 05/12/2014   Did not tolerated cymbalta - hives, itching Tried nortriptyline - not effective Did not tolerate Effexor-nausea gabapentin-not effective    Diverticulitis of large intestine 05/12/2014   Dyslipidemia    Gastroesophageal reflux disease 03/18/2009   Hearing loss 02/01/2017   Hypercalcemia 10/12/2016   Hypertension    Hypokalemia 01/06/2020   Insomnia 03/20/2009   Irritable bowel syndrome 03/20/2009   With chronic constipation   Laryngopharyngeal reflux (LPR) 09/27/2016   Lumbar pain 08/23/2016   Mild neurocognitive disorder due to multiple etiologies 11/06/2020   Myofascial pain 05/19/2015   Neck pain 09/27/2016   Non-toxic multinodular goiter 05/18/2009   Obstructive sleep apnea 03/18/2009   Not using CPAP   Osteoarthritis of left knee 01/12/2017   Osteoarthritis of shoulder region    Pain in joint of right shoulder 01/21/2019   Presbycusis of both ears 09/27/2016   Seizure disorder    Situational anxiety    Spondylolisthesis 09/20/2017   Steatosis of liver 05/12/2014   Type 2 diabetes mellitus 05/12/2014   Unstable angina 01/05/2020   Vertigo 04/13/2009   Past Surgical History:  Procedure Laterality Date   ABDOMINAL HYSTERECTOMY  1970's   Partial  APPENDECTOMY     CHOLECYSTECTOMY     LEFT HEART CATH AND CORONARY ANGIOGRAPHY N/A 05/28/2018   Procedure: LEFT HEART CATH AND CORONARY ANGIOGRAPHY;  Surgeon: Lyn Records, MD;  Location: [facility];  Service: Cardiovascular;  Laterality: N/A;   LEFT HEART CATH AND CORONARY ANGIOGRAPHY N/A 01/06/2020   Procedure: LEFT HEART CATH AND CORONARY ANGIOGRAPHY;   Surgeon: Lyn Records, MD;  Location: [facility];  Service: Cardiovascular;  Laterality: N/A;   LUMBAR EPIDURAL INJECTION Left 08/25/2017   SHOULDER SURGERY  2008   LT, post fall    TONSILLECTOMY AND ADENOIDECTOMY     TOTAL KNEE ARTHROPLASTY Left 01/12/2017   Procedure: LEFT TOTAL KNEE ARTHROPLASTY;  Surgeon: Jene Every, MD;  Location: [facility];  Service: Orthopedics;  Laterality: Left;  120 mins     A IV Location/Drains/Wounds Patient Lines/Drains/Airways Status     Active Line/Drains/Airways     Name Placement date Placement time Site Days   Peripheral IV 05/01/23 20 G Distal;Posterior;Right Forearm 05/01/23  1948  Forearm  1            Intake/Output Last 24 hours No intake or output data in the 24 hours ending 05/02/23 0336  Labs/Imaging Results for orders placed or performed during the hospital encounter of 05/01/23 (from the past 48 hour(s))  POC CBG, ED     Status: None   Collection Time: 05/01/23  7:38 PM  Result Value Ref Range   Glucose-Capillary 86 70 - 99 mg/dL    Comment: Glucose reference range applies only to samples taken after fasting for at least 8 hours.  Resp panel by RT-PCR (RSV, Flu A&B, Covid) Anterior Nasal Swab     Status: None   Collection Time: 05/01/23  7:40 PM   Specimen: Anterior Nasal Swab  Result Value Ref Range   SARS Coronavirus 2 by RT PCR NEGATIVE NEGATIVE   Influenza A by PCR NEGATIVE NEGATIVE   Influenza B by PCR NEGATIVE NEGATIVE    Comment: (NOTE) The Xpert Xpress SARS-CoV-2/FLU/RSV plus assay is intended as an aid in the diagnosis of influenza from Nasopharyngeal swab specimens and should not be used as a sole basis for treatment. Nasal washings and aspirates are unacceptable for Xpert Xpress SARS-CoV-2/FLU/RSV testing.  Fact Sheet for Patients: BloggerCourse.com  Fact Sheet for Healthcare Providers: SeriousBroker.it  This test is not yet approved or cleared by the  [place] FDA and has been authorized for detection and/or diagnosis of SARS-CoV-2 by FDA under an Emergency Use Authorization (EUA). This EUA will remain in effect (meaning this test can be used) for the duration of the COVID-19 declaration under Section 564(b)(1) of the Act, 21 U.S.C. section 360bbb-3(b)(1), unless the authorization is terminated or revoked.     Resp Syncytial Virus by PCR NEGATIVE NEGATIVE    Comment: (NOTE) Fact Sheet for Patients: BloggerCourse.com  Fact Sheet for Healthcare Providers: SeriousBroker.it  This test is not yet approved or cleared by the [place] FDA and has been authorized for detection and/or diagnosis of SARS-CoV-2 by FDA under an Emergency Use Authorization (EUA). This EUA will remain in effect (meaning this test can be used) for the duration of the COVID-19 declaration under Section 564(b)(1) of the Act, 21 U.S.C. section 360bbb-3(b)(1), unless the authorization is terminated or revoked.  Performed at [facility], [address] [place], [place] [address]   Comprehensive metabolic panel     Status: None   Collection Time: 05/01/23  7:49  PM  Result Value Ref Range   Sodium 135 135 - 145 mmol/L   Potassium 3.8 3.5 - 5.1 mmol/L   Chloride 100 98 - 111 mmol/L   CO2 24 22 - 32 mmol/L   Glucose, Bld 91 70 - 99 mg/dL    Comment: Glucose reference range applies only to samples taken after fasting for at least 8 hours.   BUN 10 8 - 23 mg/dL   Creatinine, Ser 1.61 0.44 - 1.00 mg/dL   Calcium 9.8 8.9 - 09.6 mg/dL   Total Protein 7.6 6.5 - 8.1 g/dL   Albumin 3.9 3.5 - 5.0 g/dL   AST 29 15 - 41 U/L   ALT 21 0 - 44 U/L   Alkaline Phosphatase 46 38 - 126 U/L   Total Bilirubin 0.7 <1.2 mg/dL   GFR, Estimated >04 >54 mL/min    Comment: (NOTE) Calculated using the CKD-EPI Creatinine Equation (2021)    Anion gap 11 5 - 15    Comment: Performed at [facility], [address] [place], [place] [address]  CBC with Differential     Status: None   Collection Time: 05/01/23  7:49 PM  Result Value Ref Range   WBC 9.2 4.0 - 10.5 K/uL   RBC 4.30 3.87 - 5.11 MIL/uL   Hemoglobin 12.9 12.0 - 15.0 g/dL   HCT 91.4 78.2 - 95.6 %   MCV 89.3 80.0 - 100.0 fL   MCH 30.0 26.0 - 34.0 pg   MCHC 33.6 30.0 - 36.0 g/dL   RDW 21.3 08.6 - 57.8 %   Platelets 250 150 - 400 K/uL   nRBC 0.0 0.0 - 0.2 %   Neutrophils Relative % 63 %   Neutro Abs 5.8 1.7 - 7.7 K/uL   Lymphocytes Relative 29 %   Lymphs Abs 2.7 0.7 - 4.0 K/uL   Monocytes Relative 8 %   Monocytes Absolute 0.7 0.1 - 1.0 K/uL   Eosinophils Relative 0 %   Eosinophils Absolute 0.0 0.0 - 0.5 K/uL   Basophils Relative 0 %   Basophils Absolute 0.0 0.0 - 0.1 K/uL   Immature Granulocytes 0 %   Abs Immature Granulocytes 0.03 0.00 - 0.07 K/uL    Comment: Performed at [facility], [address] [place], [place] [address]  Lipase, blood     Status: None   Collection Time: 05/01/23  7:49 PM  Result Value Ref Range   Lipase 37 11 - 51 U/L    Comment: Performed at [facility], [address] [place], [place] [address]  CBG monitoring, ED     Status: None   Collection Time: 05/02/23  3:29 AM  Result Value Ref Range   Glucose-Capillary 95 70 - 99 mg/dL    Comment: Glucose reference range applies only to samples taken after fasting for at least 8 hours.   CT ABDOMEN PELVIS W CONTRAST  Result Date: 05/01/2023 CLINICAL DATA:  Left upper quadrant pain EXAM: CT ABDOMEN AND PELVIS WITH CONTRAST TECHNIQUE: Multidetector CT imaging of the abdomen and pelvis was performed using the standard protocol following bolus administration of intravenous contrast. RADIATION DOSE REDUCTION: This exam was performed according to the departmental dose-optimization program which includes automated exposure control, adjustment of the mA and/or kV according to patient size and/or use of iterative reconstruction technique. CONTRAST:  75mL  OMNIPAQUE IOHEXOL 350 MG/ML SOLN COMPARISON:  None Available. FINDINGS: Lower chest: No acute abnormality Hepatobiliary: No focal liver abnormality is  seen. Status post cholecystectomy. No biliary dilatation. Pancreas: No focal abnormality or ductal dilatation. Spleen: No focal abnormality.  Normal size. Adrenals/Urinary Tract: No adrenal abnormality. No focal renal abnormality. No stones or hydronephrosis. Urinary bladder is unremarkable. Stomach/Bowel: Colonic diverticulosis. Slight haziness/inflammation around the proximal sigmoid colon compatible with early active diverticulitis. Stomach and small bowel decompressed, unremarkable. Vascular/Lymphatic: Aortic atherosclerosis. No evidence of aneurysm or adenopathy. Reproductive: Prior hysterectomy.  No adnexal masses. Other: No free fluid or free air. Musculoskeletal: No acute bony abnormality. IMPRESSION: Colonic diverticulosis. Subtle inflammation around the proximal sigmoid colon compatible with early active diverticulitis. Aortic atherosclerosis. Electronically Signed   By: Charlett Nose M.D.   On: 05/01/2023 21:33    Pending Labs Unresulted Labs (From admission, onward)     Start     Ordered   05/09/23 0500  Creatinine, serum  (enoxaparin (LOVENOX)    CrCl >/= 30 ml/min)  Weekly,   R     Comments: while on enoxaparin therapy    05/02/23 0123   05/02/23 0500  Basic metabolic panel  Daily,   R      05/02/23 0123   05/02/23 0500  CBC  Daily,   R      05/02/23 0123   05/01/23 1933  Urinalysis, Routine w reflex microscopic -Urine, Clean Catch  Once,   URGENT       Question:  Specimen Source  Answer:  Urine, Clean Catch   05/01/23 1933   Pending  Creatinine, serum  (enoxaparin (LOVENOX)    CrCl >/= 30 ml/min)  Weekly,   R     Comments: while on enoxaparin therapy    Pending   Pending  Basic metabolic panel  Daily,   R      Pending   Pending  CBC  Daily,   R      Pending            Vitals/Pain Today's Vitals   05/02/23 0000 05/02/23  0030 05/02/23 0112 05/02/23 0330  BP: 113/60 111/61  120/64  Pulse: 69 69  (!) 56  Resp:    16  Temp:      TempSrc:      SpO2: 95% 95%  100%  Weight:   74.4 kg   PainSc:        Isolation Precautions No active isolations  Medications Medications  insulin aspart (novoLOG) injection 0-6 Units ( Subcutaneous Not Given 05/02/23 0336)  enoxaparin (LOVENOX) injection 40 mg (has no administration in time range)  acetaminophen (TYLENOL) tablet 650 mg (has no administration in time range)    Or  acetaminophen (TYLENOL) suppository 650 mg (has no administration in time range)  oxyCODONE (Oxy IR/ROXICODONE) immediate release tablet 5 mg (has no administration in time range)  fentaNYL (SUBLIMAZE) injection 12.5-50 mcg (has no administration in time range)  ondansetron (ZOFRAN) tablet 4 mg (has no administration in time range)    Or  ondansetron (ZOFRAN) injection 4 mg (has no administration in time range)  metroNIDAZOLE (FLAGYL) IVPB 500 mg (has no administration in time range)  levofloxacin (LEVAQUIN) IVPB 500 mg (has no administration in time range)  acetaminophen (TYLENOL) tablet 1,000 mg (1,000 mg Oral Given 05/01/23 1940)  iohexol (OMNIPAQUE) 350 MG/ML injection 75 mL (75 mLs Intravenous Contrast Given 05/01/23 2105)  fentaNYL (SUBLIMAZE) injection 50 mcg (50 mcg Intravenous Given 05/01/23 2331)  ondansetron (ZOFRAN) injection 4 mg (4 mg Intravenous Given 05/01/23 2331)  ciprofloxacin (CIPRO) IVPB 400 mg (0 mg Intravenous Stopped 05/02/23 0334)  And  metroNIDAZOLE (FLAGYL) IVPB 500 mg (0 mg Intravenous Stopped 05/02/23 0334)    Mobility walks with device     Focused Assessments    R Recommendations: See Admitting Provider Note  Report given to:   Additional Notes:

## 2023-05-02 NOTE — H&P (Signed)
History and Physical    [name] VFI:[id number] DOB: [birth year] DOA: 05/01/2023  PCP: Pincus Sanes, MD   Patient coming from: Home   Chief Complaint: LLQ abdominal pain   HPI: [name] is a 82 y.o. female with medical history significant for type 2 diabetes mellitus, anxiety, seizure disorder, IBS, and nonobstructive CAD who presents with LLQ abdominal pain.   Patient reports developing LLQ abdominal pain on 04/28/2023.  This has progressively worsened and become severe.  She has had nausea associated with this but no vomiting.  She denies any melena or hematochezia.  She did not realize that she was febrile.  ED Course: Upon arrival to the ED, patient is found to be febrile to 38.1 C and saturating well on room air with normal heart rate and stable blood pressure.  CMP and CBC are normal.  Lipase is normal.  CT is concerning for developing sigmoid diverticulitis.  Patient was treated with acetaminophen, fentanyl, Zofran, ciprofloxacin, and Flagyl in the ED.  Review of Systems:  All other systems reviewed and apart from HPI, are negative.  Past Medical History:  Diagnosis Date   Abdominal distension, gaseous 04/10/2017   Allergic rhinitis 03/13/2013   CAD (coronary artery disease) 05/28/2018   Cerebrovascular disease    Chest pain 10/12/2016   Degeneration of lumbar intervertebral disc 08/02/2017   Diabetic neuropathy 05/12/2014   Did not tolerated cymbalta - hives, itching Tried nortriptyline - not effective Did not tolerate Effexor-nausea gabapentin-not effective    Diverticulitis of large intestine 05/12/2014   Dyslipidemia    Gastroesophageal reflux disease 03/18/2009   Hearing loss 02/01/2017   Hypercalcemia 10/12/2016   Hypertension    Hypokalemia 01/06/2020   Insomnia 03/20/2009   Irritable bowel syndrome 03/20/2009   With chronic constipation   Laryngopharyngeal reflux (LPR) 09/27/2016   Lumbar pain 08/23/2016   Mild neurocognitive disorder due to  multiple etiologies 11/06/2020   Myofascial pain 05/19/2015   Neck pain 09/27/2016   Non-toxic multinodular goiter 05/18/2009   Obstructive sleep apnea 03/18/2009   Not using CPAP   Osteoarthritis of left knee 01/12/2017   Osteoarthritis of shoulder region    Pain in joint of right shoulder 01/21/2019   Presbycusis of both ears 09/27/2016   Seizure disorder    Situational anxiety    Spondylolisthesis 09/20/2017   Steatosis of liver 05/12/2014   Type 2 diabetes mellitus 05/12/2014   Unstable angina 01/05/2020   Vertigo 04/13/2009    Past Surgical History:  Procedure Laterality Date   ABDOMINAL HYSTERECTOMY  1970's   Partial   APPENDECTOMY     CHOLECYSTECTOMY     LEFT HEART CATH AND CORONARY ANGIOGRAPHY N/A 05/28/2018   Procedure: LEFT HEART CATH AND CORONARY ANGIOGRAPHY;  Surgeon: Lyn Records, MD;  Location: [facility];  Service: Cardiovascular;  Laterality: N/A;   LEFT HEART CATH AND CORONARY ANGIOGRAPHY N/A 01/06/2020   Procedure: LEFT HEART CATH AND CORONARY ANGIOGRAPHY;  Surgeon: Lyn Records, MD;  Location: [facility];  Service: Cardiovascular;  Laterality: N/A;   LUMBAR EPIDURAL INJECTION Left 08/25/2017   SHOULDER SURGERY  2008   LT, post fall    TONSILLECTOMY AND ADENOIDECTOMY     TOTAL KNEE ARTHROPLASTY Left 01/12/2017   Procedure: LEFT TOTAL KNEE ARTHROPLASTY;  Surgeon: Jene Every, MD;  Location: [facility];  Service: Orthopedics;  Laterality: Left;  120 mins    Social History:   reports that she quit smoking about 37 years ago. Her  smoking use included cigarettes. She has never used smokeless tobacco. She reports current alcohol use. She reports that she does not use drugs.  Allergies  Allergen Reactions   Cymbalta [Duloxetine Hcl] Hives and Itching   Effexor [Venlafaxine] Nausea Only   Hm Lidocaine Patch [Lidocaine] Other (See Comments)    Blisters the skin   Linzess [Linaclotide]     diarrhea   Other Other (See Comments)    "SEEDED" food due to  stomach issues   Statins Nausea And Vomiting   Penicillins Hives and Rash    Has patient had a PCN reaction causing immediate rash, facial/tongue/throat swelling, SOB or lightheadedness with hypotension: Yes Has patient had a PCN reaction causing severe rash involving mucus membranes or skin necrosis: No Has patient had a PCN reaction that required hospitalization: No Has patient had a PCN reaction occurring within the last 10 years: Yes If all of the above answers are "NO", then may proceed with Cephalosporin use.     Family History  Problem Relation Age of Onset   Arthritis Mother    Heart disease Father    Arthritis Other        Grandmother   Diabetes Other        Grandmother   Colon cancer Neg Hx    Stomach cancer Neg Hx    Pancreatic cancer Neg Hx      Prior to Admission medications   Medication Sig Start Date End Date Taking? Authorizing Provider  acetaminophen (TYLENOL) 500 MG tablet Take 500 mg by mouth every 6 (six) hours as needed for headache (pain).   Yes [provider]  albuterol (VENTOLIN HFA) 108 (90 Base) MCG/ACT inhaler Inhale 2 puffs into the lungs every 6 (six) hours as needed for wheezing or shortness of breath.   Yes [provider]  aspirin EC 81 MG tablet Take 1 tablet (81 mg total) by mouth daily. 04/17/18  Yes Lyn Records, MD  clonazePAM (KLONOPIN) 0.5 MG tablet TAKE 1/2 TO 1 TABLET TWICE A DAY AS NEEDED FOR ANXIETY 11/29/21  Yes Burns, Bobette Mo, MD  dicyclomine (BENTYL) 10 MG capsule TAKE 1 CAPSULE BY MOUTH 3 TIMES DAILY BEFORE MEALS *PLEASE SCHEDULE APPOINTMENT FOR FURTHER REFILLS* *REFILL REQUEST* Patient taking differently: Take 10 mg by mouth 2 (two) times daily before a meal. 02/22/23  Yes Meryl Dare, MD  docusate sodium (COLACE) 100 MG capsule Take 3 capsules (300 mg total) by mouth daily. Patient taking differently: Take 100 mg by mouth as needed for mild constipation. 11/02/22  Yes Burns, Bobette Mo, MD  ezetimibe (ZETIA) 10 MG  tablet TAKE ONE TABLET BY MOUTH ONCE DAILY 11/24/22  Yes Burns, Bobette Mo, MD  famotidine (PEPCID) 40 MG tablet TAKE ONE TABLET BY MOUTH DAILY 01/23/23  Yes Burns, Bobette Mo, MD  fluconazole (DIFLUCAN) 150 MG tablet TAKE 1 TABLET BY MOUTH EVERY 3 DAYS *REFILL REQUEST* 02/22/23  Yes Burns, Bobette Mo, MD  ketorolac (ACULAR) 0.5 % ophthalmic solution Place 1 drop into the left eye 4 (four) times daily. 04/20/23  Yes [provider]  lubiprostone ([name]) 24 MCG capsule TAKE ONE CAPSULE BY MOUTH TWICE DAILY WITH A MEAL Patient taking differently: Take 24 mcg by mouth 2 (two) times daily with a meal. 01/25/23  Yes Burns, Bobette Mo, MD  meclizine (ANTIVERT) 12.5 MG tablet TAKE 1 TABLET BY MOUTH THREE TIMES A DAY AS NEEDED FOR DIZZINESS Patient taking differently: Take 12.5 mg by mouth 3 (three) times daily as needed.  06/03/22  Yes Burns, Bobette Mo, MD  Multiple Vitamin (MULTIVITAMIN WITH MINERALS) TABS tablet Take 1 tablet by mouth daily after lunch.   Yes [provider]  nitroGLYCERIN (NITROSTAT) 0.4 MG SL tablet DISSOLVE 1 TABLET UNDER THE TONGUE AS NEEDED FOR CHEST PAIN EVERY 5 MINUTES UP TO 3 TIMES. IF NO RELIEF CALL 911. *REFILL REQUEST* Patient taking differently: Place 0.4 mg under the tongue every 5 (five) minutes as needed for chest pain (Up to 3 times). Keep on hand for when needed 02/22/23  Yes Burns, Bobette Mo, MD  ofloxacin (OCUFLOX) 0.3 % ophthalmic solution Place 1 drop into the left eye 4 (four) times daily. 04/20/23  Yes [provider]  omeprazole (PRILOSEC) 40 MG capsule TAKE ONE CAPSULE BY MOUTH ONCE DAILY Patient taking differently: Take 40 mg by mouth as needed. 01/23/23  Yes Burns, Bobette Mo, MD  OVER THE COUNTER MEDICATION Place 1 drop into both eyes 2 (two) times daily as needed (dry eyes/ pataday). Over the counter eye drop for itching   Yes [provider]  OXcarbazepine (TRILEPTAL) 150 MG tablet TAKE 1 TABLET BY MOUTH TWICE DAILY 03/20/23  Yes Burns, Bobette Mo, MD   OZEMPIC, 1 MG/DOSE, 4 MG/3ML SOPN INJECT 1MG  SUBCUTANEOUSLY ONCE WEEKLY *REFILL REQUEST* Patient taking differently: Inject 1 mg into the skin once a week. Inject on Monday 02/17/23  Yes Burns, Bobette Mo, MD  Polyvinyl Alcohol-Povidone (REFRESH OP) Place 1 drop into both eyes daily as needed (dry eyes).   Yes [provider]  potassium chloride SA (KLOR-CON M) 20 MEQ tablet TAKE 1 TABLET BY MOUTH 3 TIMES DAILY 03/02/23  Yes Burns, Bobette Mo, MD  prednisoLONE acetate (PRED FORTE) 1 % ophthalmic suspension Place 1 drop into the left eye 4 (four) times daily. 04/20/23  Yes [provider]  pregabalin (LYRICA) 100 MG capsule Take 1 capsule (100 mg total) by mouth 3 (three) times daily. 04/14/23  Yes Burns, Bobette Mo, MD  Probiotic Product (PROBIOTIC PO) Take 1 capsule by mouth daily after breakfast.   Yes [provider]  rosuvastatin (CRESTOR) 5 MG tablet TAKE ONE TABLET BY MOUTH EVERYDAY AT BEDTIME Patient taking differently: Take 5 mg by mouth at bedtime. 08/17/20  Yes Burns, Bobette Mo, MD  terconazole (TERAZOL 3) 0.8 % vaginal cream INSERT 1 APPLICATORFUL VAGINALLY FOR 3 DAYS AS NEEDED 03/16/23  Yes Burns, Bobette Mo, MD  fluticasone (FLONASE) 50 MCG/ACT nasal spray Place 2 sprays into both nostrils 2 (two) times daily as needed for allergies or rhinitis.    [provider]  Calcium Carbonate (CALCIUM 500 PO) Take 1 capsule by mouth every other day.   09/26/14  [provider]    Physical Exam: Vitals:   05/01/23 2151 05/01/23 2345 05/02/23 0000 05/02/23 0030  BP: 115/66 116/61 113/60 111/61  Pulse: 72 74 69 69  Resp: 16 16    Temp: 98.8 F (37.1 C) 99 F (37.2 C)    TempSrc:      SpO2: 99% 98% 95% 95%    Constitutional: NAD, no pallor or diaphoresis   Eyes: PERTLA, lids and conjunctivae normal ENMT: Mucous membranes are moist. Posterior pharynx clear of any exudate or lesions.   Neck: supple, no masses  Respiratory: no wheezing, no crackles. No accessory  muscle use.  Cardiovascular: S1 & S2 heard, regular rate and rhythm. No JVD. Abdomen: Soft, tender in LLQ, no guarding. Bowel sounds active.  Musculoskeletal: no clubbing / cyanosis. No joint deformity upper and lower  extremities.   Skin: no significant rashes, lesions, ulcers. Warm, dry, well-perfused. Neurologic: CN 2-12 grossly intact. Moving all extremities. Alert and oriented.  Psychiatric: Calm. Cooperative.    Labs and Imaging on Admission: I have personally reviewed following labs and imaging studies  CBC: Recent Labs  Lab 05/01/23 1949  WBC 9.2  NEUTROABS 5.8  HGB 12.9  HCT 38.4  MCV 89.3  PLT 250   Basic Metabolic Panel: Recent Labs  Lab 05/01/23 1949  NA 135  K 3.8  CL 100  CO2 24  GLUCOSE 91  BUN 10  CREATININE 0.78  CALCIUM 9.8   GFR: CrCl cannot be calculated (Unknown ideal weight.). Liver Function Tests: Recent Labs  Lab 05/01/23 1949  AST 29  ALT 21  ALKPHOS 46  BILITOT 0.7  PROT 7.6  ALBUMIN 3.9   Recent Labs  Lab 05/01/23 1949  LIPASE 37   No results for input(s): "AMMONIA" in the last 168 hours. Coagulation Profile: No results for input(s): "INR", "PROTIME" in the last 168 hours. Cardiac Enzymes: No results for input(s): "CKTOTAL", "CKMB", "CKMBINDEX", "TROPONINI" in the last 168 hours. BNP (last 3 results) No results for input(s): "PROBNP" in the last 8760 hours. HbA1C: No results for input(s): "HGBA1C" in the last 72 hours. CBG: Recent Labs  Lab 05/01/23 1938  GLUCAP 86   Lipid Profile: No results for input(s): "CHOL", "HDL", "LDLCALC", "TRIG", "CHOLHDL", "LDLDIRECT" in the last 72 hours. Thyroid Function Tests: No results for input(s): "TSH", "T4TOTAL", "FREET4", "T3FREE", "THYROIDAB" in the last 72 hours. Anemia Panel: No results for input(s): "VITAMINB12", "FOLATE", "FERRITIN", "TIBC", "IRON", "RETICCTPCT" in the last 72 hours. Urine analysis:    Component Value Date/Time   COLORURINE YELLOW 11/02/2022 1405    APPEARANCEUR CLEAR 11/02/2022 1405   LABSPEC 1.025 11/02/2022 1405   PHURINE 6.0 11/02/2022 1405   GLUCOSEU NEGATIVE 11/02/2022 1405   HGBUR NEGATIVE 11/02/2022 1405   BILIRUBINUR NEGATIVE 11/02/2022 1405   BILIRUBINUR neg 04/10/2017 1144   KETONESUR NEGATIVE 11/02/2022 1405   PROTEINUR NEGATIVE 08/09/2021 1123   UROBILINOGEN 0.2 11/02/2022 1405   NITRITE NEGATIVE 11/02/2022 1405   LEUKOCYTESUR NEGATIVE 11/02/2022 1405   Sepsis Labs: @LABRCNTIP (procalcitonin:4,lacticidven:4) ) Recent Results (from the past 240 hour(s))  Resp panel by RT-PCR (RSV, Flu A&B, Covid) Anterior Nasal Swab     Status: None   Collection Time: 05/01/23  7:40 PM   Specimen: Anterior Nasal Swab  Result Value Ref Range Status   SARS Coronavirus 2 by RT PCR NEGATIVE NEGATIVE Final   Influenza A by PCR NEGATIVE NEGATIVE Final   Influenza B by PCR NEGATIVE NEGATIVE Final    Comment: (NOTE) The Xpert Xpress SARS-CoV-2/FLU/RSV plus assay is intended as an aid in the diagnosis of influenza from Nasopharyngeal swab specimens and should not be used as a sole basis for treatment. Nasal washings and aspirates are unacceptable for Xpert Xpress SARS-CoV-2/FLU/RSV testing.  Fact Sheet for Patients: BloggerCourse.com  Fact Sheet for Healthcare Providers: SeriousBroker.it  This test is not yet approved or cleared by the [place] FDA and has been authorized for detection and/or diagnosis of SARS-CoV-2 by FDA under an Emergency Use Authorization (EUA). This EUA will remain in effect (meaning this test can be used) for the duration of the COVID-19 declaration under Section 564(b)(1) of the Act, 21 U.S.C. section 360bbb-3(b)(1), unless the authorization is terminated or revoked.     Resp Syncytial Virus by PCR NEGATIVE NEGATIVE Final    Comment: (NOTE) Fact Sheet for Patients: BloggerCourse.com  Fact  Sheet for Healthcare  Providers: SeriousBroker.it  This test is not yet approved or cleared by the [place] and has been authorized for detection and/or diagnosis of SARS-CoV-2 by FDA under an Emergency Use Authorization (EUA). This EUA will remain in effect (meaning this test can be used) for the duration of the COVID-19 declaration under Section 564(b)(1) of the Act, 21 U.S.C. section 360bbb-3(b)(1), unless the authorization is terminated or revoked.  Performed at [facility], [address] [place], [place] [address]      Radiological Exams on Admission: CT ABDOMEN PELVIS W CONTRAST  Result Date: 05/01/2023 CLINICAL DATA:  Left upper quadrant pain EXAM: CT ABDOMEN AND PELVIS WITH CONTRAST TECHNIQUE: Multidetector CT imaging of the abdomen and pelvis was performed using the standard protocol following bolus administration of intravenous contrast. RADIATION DOSE REDUCTION: This exam was performed according to the departmental dose-optimization program which includes automated exposure control, adjustment of the mA and/or kV according to patient size and/or use of iterative reconstruction technique. CONTRAST:  75mL OMNIPAQUE IOHEXOL 350 MG/ML SOLN COMPARISON:  None Available. FINDINGS: Lower chest: No acute abnormality Hepatobiliary: No focal liver abnormality is seen. Status post cholecystectomy. No biliary dilatation. Pancreas: No focal abnormality or ductal dilatation. Spleen: No focal abnormality.  Normal size. Adrenals/Urinary Tract: No adrenal abnormality. No focal renal abnormality. No stones or hydronephrosis. Urinary bladder is unremarkable. Stomach/Bowel: Colonic diverticulosis. Slight haziness/inflammation around the proximal sigmoid colon compatible with early active diverticulitis. Stomach and small bowel decompressed, unremarkable. Vascular/Lymphatic: Aortic atherosclerosis. No evidence of aneurysm or adenopathy. Reproductive: Prior hysterectomy.  No adnexal  masses. Other: No free fluid or free air. Musculoskeletal: No acute bony abnormality. IMPRESSION: Colonic diverticulosis. Subtle inflammation around the proximal sigmoid colon compatible with early active diverticulitis. Aortic atherosclerosis. Electronically Signed   By: Charlett Nose M.D.   On: 05/01/2023 21:33     Assessment/Plan   1. Sigmoid diverticulitis  - Febrile on presentation but not septic; no apparent perforation or abscess on CT  - Continue empiric antibiotics, supportive care, consider re-imaging if worsens or fails to improve   2. Type II DM   - A1c was 6.8% in May 2024  - Check CBGs and use low-intensity SSI for now    3. Anxiety  - Continue as-needed Klonopin    4. CAD  - No anginal complaints  - Continue ASA and Crestor   5. Seizure disorder  - Continue Trileptal    DVT prophylaxis: Lovenox  Code Status: Full  Level of Care: Level of care: Med-Surg Family Communication: None present   Disposition Plan:  Patient is from: home  Anticipated d/c is to: TBD Anticipated d/c date is: 05/04/23  Patient currently: Febrile and elderly, requiring inpatient treatment of diverticulitis   Consults called: None  Admission status: Inpatient     Briscoe Deutscher, MD [facility]  05/02/2023, 1:23 AM

## 2023-05-03 DIAGNOSIS — K5732 Diverticulitis of large intestine without perforation or abscess without bleeding: Secondary | ICD-10-CM | POA: Diagnosis not present

## 2023-05-03 LAB — GLUCOSE, CAPILLARY
Glucose-Capillary: 80 mg/dL (ref 70–99)
Glucose-Capillary: 91 mg/dL (ref 70–99)
Glucose-Capillary: 108 mg/dL — ABNORMAL HIGH (ref 70–99)
Glucose-Capillary: 114 mg/dL — ABNORMAL HIGH (ref 70–99)
Glucose-Capillary: 114 mg/dL — ABNORMAL HIGH (ref 70–99)
Glucose-Capillary: 135 mg/dL — ABNORMAL HIGH (ref 70–99)

## 2023-05-03 LAB — CBC
HCT: 38.2 % (ref 36.0–46.0)
Hemoglobin: 12.5 g/dL (ref 12.0–15.0)
MCH: 29.3 pg (ref 26.0–34.0)
MCHC: 32.7 g/dL (ref 30.0–36.0)
MCV: 89.7 fL (ref 80.0–100.0)
Platelets: 235 10*3/uL (ref 150–400)
RBC: 4.26 MIL/uL (ref 3.87–5.11)
RDW: 13.7 % (ref 11.5–15.5)
WBC: 5.7 10*3/uL (ref 4.0–10.5)
nRBC: 0 % (ref 0.0–0.2)

## 2023-05-03 LAB — MAGNESIUM: Magnesium: 2.1 mg/dL (ref 1.7–2.4)

## 2023-05-03 LAB — BASIC METABOLIC PANEL
Anion gap: 9 (ref 5–15)
BUN: 6 mg/dL — ABNORMAL LOW (ref 8–23)
CO2: 28 mmol/L (ref 22–32)
Calcium: 9.7 mg/dL (ref 8.9–10.3)
Chloride: 102 mmol/L (ref 98–111)
Creatinine, Ser: 0.81 mg/dL (ref 0.44–1.00)
GFR, Estimated: >60 mL/min (ref >60)
Glucose, Bld: 95 mg/dL (ref 70–99)
Potassium: 3.2 mmol/L — ABNORMAL LOW (ref 3.5–5.1)
Sodium: 139 mmol/L (ref 135–145)

## 2023-05-03 LAB — PHOSPHORUS: Phosphorus: 3.9 mg/dL (ref 2.5–4.6)

## 2023-05-03 MED ORDER — POTASSIUM CHLORIDE 20 MEQ PO PACK
40.0000 meq | PACK | Freq: Once | ORAL | Status: AC
  Administered 2023-05-03: 40 meq via ORAL
  Filled 2023-05-03: qty 2

## 2023-05-03 NOTE — Progress Notes (Signed)
PROGRESS NOTE    Deltha Bernales  ZOX:[id number] DOB: 02-14-1941 DOA: 05/01/2023 PCP: Pincus Sanes, MD    Brief Narrative:  This 82 years old female with PMH significant for type 2 diabetes, anxiety, seizure disorder, IBS, nonobstructive CAD presented in the ED with complaints of left lower quadrant abdominal pain that started on 04/28/2023.  This has progressively worsened and became severe.  Patient also reports associated nausea but denies any vomiting.  Denies any fever, chills. CT abdomen concerning for developing sigmoid diverticulitis.  Patient is started on antibiotics ciprofloxacin and Flagyl in the ED and admitted for further evaluation.  Assessment & Plan:   Principal Problem:   Diverticulitis of sigmoid colon Active Problems:   Seizure disorder (HCC)   Situational anxiety   Type 2 diabetes mellitus   CAD (coronary artery disease)  Sigmoid diverticulitis; Patient presented with LLL abdominal pain, she was febrile on presentation but not septic No apparent perforation or abscess seen on CT scan. Continue empiric antibiotics, supportive care, consider re-imaging if worsens or fails to improve . Patient reports improvement but still has LLQ tenderness. Tolerated clear liquid diet, advance as tolerated.   Type II DM: HbA1c was 6.8% in May 2024  Continue  low-intensity SSI for now     Anxiety  Continue as-needed Klonopin     CAD  No anginal complaints  Continue ASA and Crestor    Seizure disorder  Continue Trileptal      DVT prophylaxis: Lovenox Code Status: Full code Family Communication: Daughter at bed side Disposition Plan:     Status is: Inpatient Remains inpatient appropriate because: Admitted for sigmoid diverticulitis which is improving.  Continued on antibiotics. Anticipated discharge home 05/04/2023    Consultants:  None  Procedures: None  Antimicrobials:  Anti-infectives (From admission, onward)    Start     Dose/Rate Route  Frequency Ordered Stop   05/02/23 1000  metroNIDAZOLE (FLAGYL) IVPB 500 mg        500 mg 100 mL/hr over 60 Minutes Intravenous 2 times daily 05/02/23 0123 05/09/23 0959   05/02/23 1000  levofloxacin (LEVAQUIN) IVPB 500 mg        500 mg 100 mL/hr over 60 Minutes Intravenous Every 24 hours 05/02/23 0224 05/09/23 0959   05/01/23 2330  cefTRIAXone (ROCEPHIN) 2 g in sodium chloride 0.9 % 100 mL IVPB  Status:  Discontinued       Placed in "And" Linked Group   2 g 200 mL/hr over 30 Minutes Intravenous  Once 05/01/23 2322 05/01/23 2322   05/01/23 2330  metroNIDAZOLE (FLAGYL) IVPB 500 mg  Status:  Discontinued       Placed in "And" Linked Group   500 mg 100 mL/hr over 60 Minutes Intravenous  Once 05/01/23 2322 05/01/23 2322   05/01/23 2330  ciprofloxacin (CIPRO) IVPB 400 mg       Placed in "And" Linked Group   400 mg 200 mL/hr over 60 Minutes Intravenous  Once 05/01/23 2324 05/02/23 0334   05/01/23 2330  metroNIDAZOLE (FLAGYL) IVPB 500 mg       Placed in "And" Linked Group   500 mg 100 mL/hr over 60 Minutes Intravenous  Once 05/01/23 2324 05/02/23 0334      Subjective: Patient was seen and examined at bedside.  Overnight events noted. Patient continued to reports left lower quadrant abdominal pain, denies any nausea and vomiting.   Objective: Vitals:   05/02/23 2033 05/03/23 0300 05/03/23 0443 05/03/23 0744  BP: 122/66  Marland Kitchen)  142/70 118/69  Pulse: 61  60 60  Resp: 16  17 17   Temp: 97.8 F (36.6 C)  (!) 97.5 F (36.4 C) 98 F (36.7 C)  TempSrc: Oral  Oral   SpO2: 100%  100% 100%  Weight:  69.9 kg    Height:        Intake/Output Summary (Last 24 hours) at 05/03/2023 1409 Last data filed at 05/02/2023 1800 Gross per 24 hour  Intake 200 ml  Output --  Net 200 ml   Filed Weights   05/02/23 0112 05/02/23 0414 05/03/23 0300  Weight: 74.4 kg 69.9 kg 69.9 kg    Examination:  General exam: Appears calm and comfortable, not in any distress. Respiratory system: CTA bilaterally.  Respiratory effort normal. RR 15 Cardiovascular system: S1 & S2 heard, RRR. No JVD, murmurs, rubs, gallops or clicks. No pedal edema. Gastrointestinal system: Abdomen is non distended, soft , mildly tender LLQ.  Normal bowel sounds heard. Central nervous system: Alert and oriented x 3. No focal neurological deficits. Extremities: Symmetric 5 x 5 power. Skin: No rashes, lesions or ulcers Psychiatry: Judgement and insight appear normal. Mood & affect appropriate.     Data Reviewed: I have personally reviewed following labs and imaging studies  CBC: Recent Labs  Lab 05/01/23 1949 05/02/23 0547 05/03/23 0520  WBC 9.2 7.3 5.7  NEUTROABS 5.8  --   --   HGB 12.9 11.7* 12.5  HCT 38.4 35.0* 38.2  MCV 89.3 87.5 89.7  PLT 250 218 235   Basic Metabolic Panel: Recent Labs  Lab 05/01/23 1949 05/02/23 0547 05/02/23 0647 05/03/23 0520  NA 135 137  --  139  K 3.8 3.3*  --  3.2*  CL 100 102  --  102  CO2 24 25  --  28  GLUCOSE 91 99  --  95  BUN 10 7*  --  6*  CREATININE 0.78 0.70  --  0.81  CALCIUM 9.8 9.3  --  9.7  MG  --   --  1.8 2.1  PHOS  --   --  4.0 3.9   GFR: Estimated Creatinine Clearance: 50.7 mL/min (by C-G formula based on SCr of 0.81 mg/dL). Liver Function Tests: Recent Labs  Lab 05/01/23 1949  AST 29  ALT 21  ALKPHOS 46  BILITOT 0.7  PROT 7.6  ALBUMIN 3.9   Recent Labs  Lab 05/01/23 1949  LIPASE 37   No results for input(s): "AMMONIA" in the last 168 hours. Coagulation Profile: No results for input(s): "INR", "PROTIME" in the last 168 hours. Cardiac Enzymes: No results for input(s): "CKTOTAL", "CKMB", "CKMBINDEX", "TROPONINI" in the last 168 hours. BNP (last 3 results) No results for input(s): "PROBNP" in the last 8760 hours. HbA1C: No results for input(s): "HGBA1C" in the last 72 hours. CBG: Recent Labs  Lab 05/02/23 1707 05/02/23 2313 05/03/23 0402 05/03/23 0742 05/03/23 1141  GLUCAP 150* 118* 80 91 108*   Lipid Profile: No results for  input(s): "CHOL", "HDL", "LDLCALC", "TRIG", "CHOLHDL", "LDLDIRECT" in the last 72 hours. Thyroid Function Tests: No results for input(s): "TSH", "T4TOTAL", "FREET4", "T3FREE", "THYROIDAB" in the last 72 hours. Anemia Panel: No results for input(s): "VITAMINB12", "FOLATE", "FERRITIN", "TIBC", "IRON", "RETICCTPCT" in the last 72 hours. Sepsis Labs: No results for input(s): "PROCALCITON", "LATICACIDVEN" in the last 168 hours.  Recent Results (from the past 240 hour(s))  Resp panel by RT-PCR (RSV, Flu A&B, Covid) Anterior Nasal Swab     Status: None   Collection Time:  05/01/23  7:40 PM   Specimen: Anterior Nasal Swab  Result Value Ref Range Status   SARS Coronavirus 2 by RT PCR NEGATIVE NEGATIVE Final   Influenza A by PCR NEGATIVE NEGATIVE Final   Influenza B by PCR NEGATIVE NEGATIVE Final    Comment: (NOTE) The Xpert Xpress SARS-CoV-2/FLU/RSV plus assay is intended as an aid in the diagnosis of influenza from Nasopharyngeal swab specimens and should not be used as a sole basis for treatment. Nasal washings and aspirates are unacceptable for Xpert Xpress SARS-CoV-2/FLU/RSV testing.  Fact Sheet for Patients: BloggerCourse.com  Fact Sheet for Healthcare Providers: SeriousBroker.it  This test is not yet approved or cleared by the [place] FDA and has been authorized for detection and/or diagnosis of SARS-CoV-2 by FDA under an Emergency Use Authorization (EUA). This EUA will remain in effect (meaning this test can be used) for the duration of the COVID-19 declaration under Section 564(b)(1) of the Act, 21 U.S.C. section 360bbb-3(b)(1), unless the authorization is terminated or revoked.     Resp Syncytial Virus by PCR NEGATIVE NEGATIVE Final    Comment: (NOTE) Fact Sheet for Patients: BloggerCourse.com  Fact Sheet for Healthcare Providers: SeriousBroker.it  This test is not yet  approved or cleared by the [place] FDA and has been authorized for detection and/or diagnosis of SARS-CoV-2 by FDA under an Emergency Use Authorization (EUA). This EUA will remain in effect (meaning this test can be used) for the duration of the COVID-19 declaration under Section 564(b)(1) of the Act, 21 U.S.C. section 360bbb-3(b)(1), unless the authorization is terminated or revoked.  Performed at [facility], [address] [place], [place] [address]     Radiology Studies: CT ABDOMEN PELVIS W CONTRAST  Result Date: 05/01/2023 CLINICAL DATA:  Left upper quadrant pain EXAM: CT ABDOMEN AND PELVIS WITH CONTRAST TECHNIQUE: Multidetector CT imaging of the abdomen and pelvis was performed using the standard protocol following bolus administration of intravenous contrast. RADIATION DOSE REDUCTION: This exam was performed according to the departmental dose-optimization program which includes automated exposure control, adjustment of the mA and/or kV according to patient size and/or use of iterative reconstruction technique. CONTRAST:  75mL OMNIPAQUE IOHEXOL 350 MG/ML SOLN COMPARISON:  None Available. FINDINGS: Lower chest: No acute abnormality Hepatobiliary: No focal liver abnormality is seen. Status post cholecystectomy. No biliary dilatation. Pancreas: No focal abnormality or ductal dilatation. Spleen: No focal abnormality.  Normal size. Adrenals/Urinary Tract: No adrenal abnormality. No focal renal abnormality. No stones or hydronephrosis. Urinary bladder is unremarkable. Stomach/Bowel: Colonic diverticulosis. Slight haziness/inflammation around the proximal sigmoid colon compatible with early active diverticulitis. Stomach and small bowel decompressed, unremarkable. Vascular/Lymphatic: Aortic atherosclerosis. No evidence of aneurysm or adenopathy. Reproductive: Prior hysterectomy.  No adnexal masses. Other: No free fluid or free air. Musculoskeletal: No acute bony abnormality. IMPRESSION:  Colonic diverticulosis. Subtle inflammation around the proximal sigmoid colon compatible with early active diverticulitis. Aortic atherosclerosis. Electronically Signed   By: Charlett Nose M.D.   On: 05/01/2023 21:33    Scheduled Meds:  aspirin EC  81 mg Oral Daily   enoxaparin (LOVENOX) injection  40 mg Subcutaneous Daily   famotidine  40 mg Oral Daily   feeding supplement  1 Container Oral TID BM   influenza vaccine adjuvanted  0.5 mL Intramuscular Tomorrow-1000   insulin aspart  0-6 Units Subcutaneous Q4H   OXcarbazepine  150 mg Oral BID   pregabalin  100 mg Oral TID   rosuvastatin  5 mg Oral QHS   Continuous Infusions:  levofloxacin (LEVAQUIN) IV 500 mg (05/03/23 1301)   metronidazole 500 mg (05/03/23 1110)     LOS: 1 day    Time spent: 50 mins    Willeen Niece, MD [facility]   If 7PM-7AM, please contact night-coverage

## 2023-05-03 NOTE — Progress Notes (Signed)
Mobility Specialist Progress Note:   05/03/23 1053  Mobility  Activity Ambulated with assistance in hallway  Level of Assistance Contact guard assist, steadying assist  Assistive Device None  Distance Ambulated (ft) 220 ft  Activity Response Tolerated well  Mobility Referral Yes  $Mobility charge 1 Mobility  Mobility Specialist Start Time (ACUTE ONLY) 1047  Mobility Specialist Stop Time (ACUTE ONLY) 1053  Mobility Specialist Time Calculation (min) (ACUTE ONLY) 6 min   Pt received sitting EOB, agreeable to mobility session. Tolerated well, asx throughout. CGA with gait belt for safety, no AD. Returned pt to room, all needs met.   Feliciana Rossetti Mobility Specialist Please contact via Special educational needs teacher or  Rehab office at [phone]

## 2023-05-04 ENCOUNTER — Other Ambulatory Visit (HOSPITAL_COMMUNITY): Payer: Self-pay

## 2023-05-04 ENCOUNTER — Telehealth (HOSPITAL_COMMUNITY): Payer: Self-pay | Admitting: Pharmacy Technician

## 2023-05-04 DIAGNOSIS — G40909 Epilepsy, unspecified, not intractable, without status epilepticus: Secondary | ICD-10-CM | POA: Diagnosis not present

## 2023-05-04 DIAGNOSIS — K5732 Diverticulitis of large intestine without perforation or abscess without bleeding: Secondary | ICD-10-CM | POA: Diagnosis not present

## 2023-05-04 DIAGNOSIS — F418 Other specified anxiety disorders: Secondary | ICD-10-CM | POA: Diagnosis not present

## 2023-05-04 DIAGNOSIS — I2510 Atherosclerotic heart disease of native coronary artery without angina pectoris: Secondary | ICD-10-CM | POA: Diagnosis not present

## 2023-05-04 DIAGNOSIS — E876 Hypokalemia: Secondary | ICD-10-CM

## 2023-05-04 LAB — BASIC METABOLIC PANEL
Anion gap: 10 (ref 5–15)
BUN: 9 mg/dL (ref 8–23)
CO2: 26 mmol/L (ref 22–32)
Calcium: 9.5 mg/dL (ref 8.9–10.3)
Chloride: 101 mmol/L (ref 98–111)
Creatinine, Ser: 0.67 mg/dL (ref 0.44–1.00)
GFR, Estimated: >60 mL/min (ref >60)
Glucose, Bld: 104 mg/dL — ABNORMAL HIGH (ref 70–99)
Potassium: 3.6 mmol/L (ref 3.5–5.1)
Sodium: 137 mmol/L (ref 135–145)

## 2023-05-04 LAB — CBC
HCT: 36.7 % (ref 36.0–46.0)
Hemoglobin: 12.1 g/dL (ref 12.0–15.0)
MCH: 30.1 pg (ref 26.0–34.0)
MCHC: 33 g/dL (ref 30.0–36.0)
MCV: 91.3 fL (ref 80.0–100.0)
Platelets: 238 10*3/uL (ref 150–400)
RBC: 4.02 MIL/uL (ref 3.87–5.11)
RDW: 13.4 % (ref 11.5–15.5)
WBC: 4.7 10*3/uL (ref 4.0–10.5)
nRBC: 0 % (ref 0.0–0.2)

## 2023-05-04 LAB — GLUCOSE, CAPILLARY
Glucose-Capillary: 91 mg/dL (ref 70–99)
Glucose-Capillary: 88 mg/dL (ref 70–99)
Glucose-Capillary: 92 mg/dL (ref 70–99)

## 2023-05-04 LAB — PHOSPHORUS: Phosphorus: 2.9 mg/dL (ref 2.5–4.6)

## 2023-05-04 LAB — MAGNESIUM: Magnesium: 1.9 mg/dL (ref 1.7–2.4)

## 2023-05-04 MED ORDER — METRONIDAZOLE 500 MG PO TABS
500.0000 mg | ORAL_TABLET | Freq: Two times a day (BID) | ORAL | 0 refills | Status: AC
  Filled 2023-05-04: qty 14, 7d supply, fill #0

## 2023-05-04 MED ORDER — DOCUSATE SODIUM 100 MG PO CAPS
100.0000 mg | ORAL_CAPSULE | Freq: Two times a day (BID) | ORAL | Status: DC
  Administered 2023-05-04: 100 mg via ORAL
  Filled 2023-05-04: qty 1

## 2023-05-04 MED ORDER — OXYCODONE HCL 5 MG PO TABS
5.0000 mg | ORAL_TABLET | Freq: Four times a day (QID) | ORAL | 0 refills | Status: DC | PRN
  Filled 2023-05-04: qty 20, 5d supply, fill #0

## 2023-05-04 MED ORDER — ONDANSETRON HCL 4 MG PO TABS
4.0000 mg | ORAL_TABLET | Freq: Three times a day (TID) | ORAL | 0 refills | Status: AC | PRN
  Filled 2023-05-04: qty 30, 10d supply, fill #0

## 2023-05-04 MED ORDER — CEFUROXIME AXETIL 500 MG PO TABS
500.0000 mg | ORAL_TABLET | Freq: Two times a day (BID) | ORAL | 0 refills | Status: AC
  Filled 2023-05-04: qty 14, 7d supply, fill #0

## 2023-05-04 MED ORDER — OZEMPIC (1 MG/DOSE) 4 MG/3ML ~~LOC~~ SOPN: 1.0000 mg | PEN_INJECTOR | SUBCUTANEOUS | Status: DC

## 2023-05-04 NOTE — Hospital Course (Signed)
HPI: [name] is a 82 y.o. female with medical history significant for type 2 diabetes mellitus, anxiety, seizure disorder, IBS, and nonobstructive CAD who presents with LLQ abdominal pain.    Patient reports developing LLQ abdominal pain on 04/28/2023.  This has progressively worsened and become severe.  She has had nausea associated with this but no vomiting.  She denies any melena or hematochezia.  She did not realize that she was febrile.   ED Course: Upon arrival to the ED, patient is found to be febrile to 38.1 C and saturating well on room air with normal heart rate and stable blood pressure.  CMP and CBC are normal.  Lipase is normal.  CT is concerning for developing sigmoid diverticulitis.   Patient was treated with acetaminophen, fentanyl, Zofran, ciprofloxacin, and Flagyl in the ED.  Significant Events: Admitted 05/01/2023 for sigmoid diverticulitis   Significant Labs:   Significant Imaging Studies: Admission CT abd shows Colonic diverticulosis. Subtle inflammation around the proximal sigmoid colon compatible with early active diverticulitis  Antibiotic Therapy: Anti-infectives (From admission, onward)    Start     Dose/Rate Route Frequency Ordered Stop   05/02/23 1000  metroNIDAZOLE (FLAGYL) IVPB 500 mg        500 mg 100 mL/hr over 60 Minutes Intravenous 2 times daily 05/02/23 0123 05/09/23 0959   05/02/23 1000  levofloxacin (LEVAQUIN) IVPB 500 mg        500 mg 100 mL/hr over 60 Minutes Intravenous Every 24 hours 05/02/23 0224 05/09/23 0959   05/01/23 2330  cefTRIAXone (ROCEPHIN) 2 g in sodium chloride 0.9 % 100 mL IVPB  Status:  Discontinued       Placed in "And" Linked Group   2 g 200 mL/hr over 30 Minutes Intravenous  Once 05/01/23 2322 05/01/23 2322   05/01/23 2330  metroNIDAZOLE (FLAGYL) IVPB 500 mg  Status:  Discontinued       Placed in "And" Linked Group   500 mg 100 mL/hr over 60 Minutes Intravenous  Once 05/01/23 2322 05/01/23 2322   05/01/23 2330   ciprofloxacin (CIPRO) IVPB 400 mg       Placed in "And" Linked Group   400 mg 200 mL/hr over 60 Minutes Intravenous  Once 05/01/23 2324 05/02/23 0334   05/01/23 2330  metroNIDAZOLE (FLAGYL) IVPB 500 mg       Placed in "And" Linked Group   500 mg 100 mL/hr over 60 Minutes Intravenous  Once 05/01/23 2324 05/02/23 0334       Procedures:   Consultants:

## 2023-05-04 NOTE — Plan of Care (Signed)
  Problem: Education: Goal: Ability to describe self-care measures that may prevent or decrease complications (Diabetes Survival Skills Education) will improve Outcome: Completed/Met Goal: Individualized Educational Video(s) Outcome: Completed/Met   Problem: Coping: Goal: Ability to adjust to condition or change in health will improve Outcome: Completed/Met   Problem: Fluid Volume: Goal: Ability to maintain a balanced intake and output will improve Outcome: Completed/Met   Problem: Health Behavior/Discharge Planning: Goal: Ability to identify and utilize available resources and services will improve Outcome: Completed/Met Goal: Ability to manage health-related needs will improve Outcome: Completed/Met   Problem: Metabolic: Goal: Ability to maintain appropriate glucose levels will improve Outcome: Completed/Met   Problem: Nutritional: Goal: Maintenance of adequate nutrition will improve Outcome: Completed/Met Goal: Progress toward achieving an optimal weight will improve Outcome: Completed/Met   Problem: Skin Integrity: Goal: Risk for impaired skin integrity will decrease Outcome: Completed/Met   Problem: Tissue Perfusion: Goal: Adequacy of tissue perfusion will improve Outcome: Completed/Met   Problem: Education: Goal: Knowledge of General Education information will improve Description: Including pain rating scale, medication(s)/side effects and non-pharmacologic comfort measures Outcome: Completed/Met   Problem: Health Behavior/Discharge Planning: Goal: Ability to manage health-related needs will improve Outcome: Completed/Met   Problem: Clinical Measurements: Goal: Ability to maintain clinical measurements within normal limits will improve Outcome: Completed/Met Goal: Will remain free from infection Outcome: Completed/Met Goal: Diagnostic test results will improve Outcome: Completed/Met Goal: Respiratory complications will improve Outcome: Completed/Met Goal:  Cardiovascular complication will be avoided Outcome: Completed/Met   Problem: Activity: Goal: Risk for activity intolerance will decrease Outcome: Completed/Met   Problem: Nutrition: Goal: Adequate nutrition will be maintained Outcome: Completed/Met   Problem: Coping: Goal: Level of anxiety will decrease Outcome: Completed/Met   Problem: Elimination: Goal: Will not experience complications related to bowel motility Outcome: Completed/Met Goal: Will not experience complications related to urinary retention Outcome: Completed/Met   Problem: Pain Management: Goal: General experience of comfort will improve Outcome: Completed/Met   Problem: Safety: Goal: Ability to remain free from injury will improve Outcome: Completed/Met   Problem: Skin Integrity: Goal: Risk for impaired skin integrity will decrease Outcome: Completed/Met

## 2023-05-04 NOTE — Assessment & Plan Note (Signed)
Admitted for sigmoid diverticulitis. No perforation. Started on IV levaquin and flagyl  05-04-2023 tolerating soft/low residual diet. Taking oxycodone for pain. On RA. No fevers. No leukocytosis. Lives with her son. Stable for DC to home. Complete additional 7 days of abx. For a total of 10 days of abx therapy. F/u with PCP within 1 week. Hold ozempic until seen by PCP.

## 2023-05-04 NOTE — Subjective & Objective (Signed)
Pt seen and examined. No complaints. Tolerating soft diet without difficulty. Afebrile. No leukocytosis.  Pt lives at home with her son. Has assistance at home.

## 2023-05-04 NOTE — Progress Notes (Signed)
Mobility Specialist Progress Note:   05/04/23 1030  Mobility  Activity Ambulated with assistance in hallway  Level of Assistance Contact guard assist, steadying assist  Assistive Device None  Distance Ambulated (ft) 300 ft  Activity Response Tolerated well  Mobility Referral Yes  $Mobility charge 1 Mobility  Mobility Specialist Start Time (ACUTE ONLY) 1030  Mobility Specialist Stop Time (ACUTE ONLY) 1040  Mobility Specialist Time Calculation (min) (ACUTE ONLY) 10 min   Pt received in bed, agreeable to mobility session. Ambulated with CGA in hallway. Tolerated well, c/o dizziness at beginning of session (rated 3/10). Returned pt to room, all needs met.   Feliciana Rossetti Mobility Specialist Please contact via Special educational needs teacher or  Rehab office at [phone]

## 2023-05-04 NOTE — Plan of Care (Signed)
  Problem: Coping: Goal: Ability to adjust to condition or change in health will improve Outcome: Progressing   Problem: Metabolic: Goal: Ability to maintain appropriate glucose levels will improve Outcome: Progressing   Problem: Skin Integrity: Goal: Risk for impaired skin integrity will decrease Outcome: Progressing   Problem: Education: Goal: Knowledge of General Education information will improve Description: Including pain rating scale, medication(s)/side effects and non-pharmacologic comfort measures Outcome: Progressing

## 2023-05-04 NOTE — Progress Notes (Addendum)
Transition of Care [facility]) - Inpatient Brief Assessment   Patient Details  Name: [name] MRN: [id number] Date of Birth: 1940/08/14  Transition of Care [facility] CM/SW Contact:    Janae Bridgeman, RN Phone Number: 05/04/2023, 12:00 PM   Clinical Narrative: Patient was admitted to the hospital with diverticulitis of sigmoid colon and plans to discharge home when medically stable.  The patient lives with her son at the home.  DME at the home includes Ephraim Hamburger and crutches.  Patient states that her PCP is Dr. Lawerance Bach and requests appointment the first of next week since she has a scheduled eye surgery planned for 05/12/2023.  I called and office and schedule a hospital follow up on Monday, 05/08/2023 at 3 pm.  Patient plans to discharge home with the daughter today when stable for discharge.   Transition of Care Asessment: Insurance and Status: (P) Insurance coverage has been reviewed Patient has primary care physician: (P) Yes Home environment has been reviewed: (P) From home with son Prior level of function:: (P) Independent Prior/Current Home Services: (P) No current home services Social Determinants of Health Reivew: (P) SDOH reviewed interventions complete Readmission risk has been reviewed: (P) Yes Transition of care needs: (P) transition of care needs identified, TOC will continue to follow

## 2023-05-04 NOTE — Progress Notes (Signed)
PROGRESS NOTE    [name]  ONG:[id number] DOB: Apr 05, 1941 DOA: 05/01/2023 PCP: Pincus Sanes, MD  Subjective: Pt seen and examined. No complaints. Tolerating soft diet without difficulty. Afebrile. No leukocytosis.  Pt lives at home with her son. Has assistance at home.   Hospital Course: HPI: [name] is a 82 y.o. female with medical history significant for type 2 diabetes mellitus, anxiety, seizure disorder, IBS, and nonobstructive CAD who presents with LLQ abdominal pain.    Patient reports developing LLQ abdominal pain on 04/28/2023.  This has progressively worsened and become severe.  She has had nausea associated with this but no vomiting.  She denies any melena or hematochezia.  She did not realize that she was febrile.   ED Course: Upon arrival to the ED, patient is found to be febrile to 38.1 C and saturating well on room air with normal heart rate and stable blood pressure.  CMP and CBC are normal.  Lipase is normal.  CT is concerning for developing sigmoid diverticulitis.   Patient was treated with acetaminophen, fentanyl, Zofran, ciprofloxacin, and Flagyl in the ED.  Significant Events: Admitted 05/01/2023 for sigmoid diverticulitis   Significant Labs:   Significant Imaging Studies: Admission CT abd shows Colonic diverticulosis. Subtle inflammation around the proximal sigmoid colon compatible with early active diverticulitis  Antibiotic Therapy: Anti-infectives (From admission, onward)    Start     Dose/Rate Route Frequency Ordered Stop   05/02/23 1000  metroNIDAZOLE (FLAGYL) IVPB 500 mg        500 mg 100 mL/hr over 60 Minutes Intravenous 2 times daily 05/02/23 0123 05/09/23 0959   05/02/23 1000  levofloxacin (LEVAQUIN) IVPB 500 mg        500 mg 100 mL/hr over 60 Minutes Intravenous Every 24 hours 05/02/23 0224 05/09/23 0959   05/01/23 2330  cefTRIAXone (ROCEPHIN) 2 g in sodium chloride 0.9 % 100 mL IVPB  Status:  Discontinued        Placed in "And" Linked Group   2 g 200 mL/hr over 30 Minutes Intravenous  Once 05/01/23 2322 05/01/23 2322   05/01/23 2330  metroNIDAZOLE (FLAGYL) IVPB 500 mg  Status:  Discontinued       Placed in "And" Linked Group   500 mg 100 mL/hr over 60 Minutes Intravenous  Once 05/01/23 2322 05/01/23 2322   05/01/23 2330  ciprofloxacin (CIPRO) IVPB 400 mg       Placed in "And" Linked Group   400 mg 200 mL/hr over 60 Minutes Intravenous  Once 05/01/23 2324 05/02/23 0334   05/01/23 2330  metroNIDAZOLE (FLAGYL) IVPB 500 mg       Placed in "And" Linked Group   500 mg 100 mL/hr over 60 Minutes Intravenous  Once 05/01/23 2324 05/02/23 0334       Procedures:   Consultants:     Assessment and Plan: * Diverticulitis of sigmoid colon Admitted for sigmoid diverticulitis. No perforation. Started on IV levaquin and flagyl  05-04-2023 tolerating soft/low residual diet. Taking oxycodone for pain. On RA. No fevers. No leukocytosis. Lives with her son. Stable for DC to home. Complete additional 7 days of abx. For a total of 10 days of abx therapy. F/u with PCP within 1 week. Hold ozempic until seen by PCP.  CAD (coronary artery disease) Stable. On asa and crestor.  Type 2 diabetes mellitus Stable.  Situational anxiety Stable. On klonopin prn.  Seizure disorder (HCC) Stable. On trileptal.    DVT prophylaxis: enoxaparin (LOVENOX) injection  40 mg Start: 05/02/23 1000     Code Status: Full Code Family Communication: no family at bedside. Pt is decisional Disposition Plan: return home Reason for continuing need for hospitalization: medically stable for Dc.  Objective: Vitals:   05/03/23 2007 05/04/23 0426 05/04/23 0600 05/04/23 0738  BP: (!) 105/54 (!) 121/55  128/70  Pulse: 63 (!) 53  (!) 56  Resp: 18 18  16   Temp: (!) 97.3 F (36.3 C) 98.1 F (36.7 C)  (!) 97.3 F (36.3 C)  TempSrc:    Oral  SpO2: 99% 100%  100%  Weight:   69.2 kg   Height:       No intake or output data in  the 24 hours ending 05/04/23 0912 Filed Weights   05/02/23 0414 05/03/23 0300 05/04/23 0600  Weight: 69.9 kg 69.9 kg 69.2 kg    Examination:  Physical Exam Vitals and nursing note reviewed.  Constitutional:      General: She is not in acute distress.    Appearance: She is not toxic-appearing or diaphoretic.  HENT:     Head: Normocephalic and atraumatic.     Nose: Nose normal.  Cardiovascular:     Rate and Rhythm: Normal rate and regular rhythm.  Pulmonary:     Effort: Pulmonary effort is normal.     Breath sounds: Normal breath sounds.  Abdominal:     General: Bowel sounds are normal. There is no distension.     Palpations: Abdomen is soft.     Tenderness: There is no guarding or rebound.     Comments: Mild tenderness LLQ. No rebound or guarding  Skin:    General: Skin is warm and dry.     Capillary Refill: Capillary refill takes less than 2 seconds.  Neurological:     General: No focal deficit present.     Mental Status: She is alert and oriented to person, place, and time.     Comments: Slightly hard of hearing Very firm handshake!     Data Reviewed: I have personally reviewed following labs and imaging studies  CBC: Recent Labs  Lab 05/01/23 1949 05/02/23 0547 05/03/23 0520 05/04/23 0716  WBC 9.2 7.3 5.7 4.7  NEUTROABS 5.8  --   --   --   HGB 12.9 11.7* 12.5 12.1  HCT 38.4 35.0* 38.2 36.7  MCV 89.3 87.5 89.7 91.3  PLT 250 218 235 238   Basic Metabolic Panel: Recent Labs  Lab 05/01/23 1949 05/02/23 0547 05/02/23 0647 05/03/23 0520 05/04/23 0716  NA 135 137  --  139 137  K 3.8 3.3*  --  3.2* 3.6  CL 100 102  --  102 101  CO2 24 25  --  28 26  GLUCOSE 91 99  --  95 104*  BUN 10 7*  --  6* 9  CREATININE 0.78 0.70  --  0.81 0.67  CALCIUM 9.8 9.3  --  9.7 9.5  MG  --   --  1.8 2.1 1.9  PHOS  --   --  4.0 3.9 2.9   GFR: Estimated Creatinine Clearance: 51.1 mL/min (by C-G formula based on SCr of 0.67 mg/dL). Liver Function Tests: Recent Labs  Lab  05/01/23 1949  AST 29  ALT 21  ALKPHOS 46  BILITOT 0.7  PROT 7.6  ALBUMIN 3.9   Recent Labs  Lab 05/01/23 1949  LIPASE 37   CBG: Recent Labs  Lab 05/03/23 1723 05/03/23 2010 05/03/23 2328 05/04/23 0401 05/04/23 5188  GLUCAP 114* 114* 135* 91 88    Recent Results (from the past 240 hour(s))  Resp panel by RT-PCR (RSV, Flu A&B, Covid) Anterior Nasal Swab     Status: None   Collection Time: 05/01/23  7:40 PM   Specimen: Anterior Nasal Swab  Result Value Ref Range Status   SARS Coronavirus 2 by RT PCR NEGATIVE NEGATIVE Final   Influenza A by PCR NEGATIVE NEGATIVE Final   Influenza B by PCR NEGATIVE NEGATIVE Final    Comment: (NOTE) The Xpert Xpress SARS-CoV-2/FLU/RSV plus assay is intended as an aid in the diagnosis of influenza from Nasopharyngeal swab specimens and should not be used as a sole basis for treatment. Nasal washings and aspirates are unacceptable for Xpert Xpress SARS-CoV-2/FLU/RSV testing.  Fact Sheet for Patients: BloggerCourse.com  Fact Sheet for Healthcare Providers: SeriousBroker.it  This test is not yet approved or cleared by the [place] FDA and has been authorized for detection and/or diagnosis of SARS-CoV-2 by FDA under an Emergency Use Authorization (EUA). This EUA will remain in effect (meaning this test can be used) for the duration of the COVID-19 declaration under Section 564(b)(1) of the Act, 21 U.S.C. section 360bbb-3(b)(1), unless the authorization is terminated or revoked.     Resp Syncytial Virus by PCR NEGATIVE NEGATIVE Final    Comment: (NOTE) Fact Sheet for Patients: BloggerCourse.com  Fact Sheet for Healthcare Providers: SeriousBroker.it  This test is not yet approved or cleared by the [place] FDA and has been authorized for detection and/or diagnosis of SARS-CoV-2 by FDA under an Emergency Use Authorization  (EUA). This EUA will remain in effect (meaning this test can be used) for the duration of the COVID-19 declaration under Section 564(b)(1) of the Act, 21 U.S.C. section 360bbb-3(b)(1), unless the authorization is terminated or revoked.  Performed at [facility], [address], [place], [place] [address]      Radiology Studies: No results found.  Scheduled Meds:  aspirin EC  81 mg Oral Daily   docusate sodium  100 mg Oral BID   enoxaparin (LOVENOX) injection  40 mg Subcutaneous Daily   famotidine  40 mg Oral Daily   feeding supplement  1 Container Oral TID BM   influenza vaccine adjuvanted  0.5 mL Intramuscular Tomorrow-1000   insulin aspart  0-6 Units Subcutaneous Q4H   OXcarbazepine  150 mg Oral BID   pregabalin  100 mg Oral TID   rosuvastatin  5 mg Oral QHS   Continuous Infusions:  levofloxacin (LEVAQUIN) IV 500 mg (05/03/23 1301)   metronidazole 500 mg (05/03/23 2118)     LOS: 2 days   Time spent: 40 minutes  Carollee Herter, DO  [facility]  05/04/2023, 9:12 AM

## 2023-05-04 NOTE — Assessment & Plan Note (Signed)
Stable. On trileptal.

## 2023-05-04 NOTE — Assessment & Plan Note (Signed)
Serum potassium repleted. Repeat K is normal. Resolved.

## 2023-05-04 NOTE — Assessment & Plan Note (Signed)
Stable

## 2023-05-04 NOTE — Assessment & Plan Note (Signed)
Stable. On asa and crestor.

## 2023-05-04 NOTE — Discharge Summary (Signed)
[facility] Physician Discharge Summary   Patient name: [name]  Admit date:     05/01/2023  Discharge date: 05/04/2023  Attending Physician: Briscoe Deutscher [[id number]]  Discharge Physician: Carollee Herter   PCP: Pincus Sanes, MD  Admitted From: Home  Disposition:  Home  Recommendations for Outpatient Follow-up:  Follow up with PCP in 1-2 weeks Hold ozempic until seen by PCP in office to restart after diverticulitis treatment has completed  Home Health:No Equipment/Devices: None  Discharge Condition:Stable CODE STATUS:FULL Diet recommendation:  soft/low residual diet Fluid Restriction: None  Hospital Summary: HPI: [name] is a 82 y.o. female with medical history significant for type 2 diabetes mellitus, anxiety, seizure disorder, IBS, and nonobstructive CAD who presents with LLQ abdominal pain.    Patient reports developing LLQ abdominal pain on 04/28/2023.  This has progressively worsened and become severe.  She has had nausea associated with this but no vomiting.  She denies any melena or hematochezia.  She did not realize that she was febrile.   ED Course: Upon arrival to the ED, patient is found to be febrile to 38.1 C and saturating well on room air with normal heart rate and stable blood pressure.  CMP and CBC are normal.  Lipase is normal.  CT is concerning for developing sigmoid diverticulitis.   Patient was treated with acetaminophen, fentanyl, Zofran, ciprofloxacin, and Flagyl in the ED.  Significant Events: Admitted 05/01/2023 for sigmoid diverticulitis   Significant Labs:   Significant Imaging Studies: Admission CT abd shows Colonic diverticulosis. Subtle inflammation around the proximal sigmoid colon compatible with early active diverticulitis  Antibiotic Therapy: Anti-infectives (From admission, onward)    Start     Dose/Rate Route Frequency Ordered Stop   05/02/23 1000  metroNIDAZOLE (FLAGYL) IVPB 500 mg        500 mg 100  mL/hr over 60 Minutes Intravenous 2 times daily 05/02/23 0123 05/09/23 0959   05/02/23 1000  levofloxacin (LEVAQUIN) IVPB 500 mg        500 mg 100 mL/hr over 60 Minutes Intravenous Every 24 hours 05/02/23 0224 05/09/23 0959   05/01/23 2330  cefTRIAXone (ROCEPHIN) 2 g in sodium chloride 0.9 % 100 mL IVPB  Status:  Discontinued       Placed in "And" Linked Group   2 g 200 mL/hr over 30 Minutes Intravenous  Once 05/01/23 2322 05/01/23 2322   05/01/23 2330  metroNIDAZOLE (FLAGYL) IVPB 500 mg  Status:  Discontinued       Placed in "And" Linked Group   500 mg 100 mL/hr over 60 Minutes Intravenous  Once 05/01/23 2322 05/01/23 2322   05/01/23 2330  ciprofloxacin (CIPRO) IVPB 400 mg       Placed in "And" Linked Group   400 mg 200 mL/hr over 60 Minutes Intravenous  Once 05/01/23 2324 05/02/23 0334   05/01/23 2330  metroNIDAZOLE (FLAGYL) IVPB 500 mg       Placed in "And" Linked Group   500 mg 100 mL/hr over 60 Minutes Intravenous  Once 05/01/23 2324 05/02/23 0334       Procedures:   Consultants:    Hospital Course by Problem: * Diverticulitis of sigmoid colon Admitted for sigmoid diverticulitis. No perforation. Started on IV levaquin and flagyl  05-04-2023 tolerating soft/low residual diet. Taking oxycodone for pain. On RA. No fevers. No leukocytosis. Lives with her son. Stable for DC to home. Complete additional 7 days of abx. For a total of 10 days of abx therapy.  F/u with PCP within 1 week. Hold ozempic until seen by PCP.  CAD (coronary artery disease) Stable. On asa and crestor.  Type 2 diabetes mellitus Stable.  Situational anxiety Stable. On klonopin prn.  Seizure disorder (HCC) Stable. On trileptal.  Hypokalemia Serum potassium repleted. Repeat K is normal. Resolved.    Discharge Diagnoses:  Principal Problem:   Diverticulitis of sigmoid colon Active Problems:   Seizure disorder (HCC)   Situational anxiety   Type 2 diabetes mellitus   CAD (coronary artery  disease)   Hypokalemia   Discharge Instructions  Discharge Instructions     Call MD for:  difficulty breathing, headache or visual disturbances   Complete by: As directed    Call MD for:  extreme fatigue   Complete by: As directed    Call MD for:  hives   Complete by: As directed    Call MD for:  persistant dizziness or light-headedness   Complete by: As directed    Call MD for:  persistant nausea and vomiting   Complete by: As directed    Call MD for:  redness, tenderness, or signs of infection (pain, swelling, redness, odor or green/yellow discharge around incision site)   Complete by: As directed    Call MD for:  severe uncontrolled pain   Complete by: As directed    Call MD for:  temperature >100.4   Complete by: As directed    DIET SOFT   Complete by: As directed    Discharge instructions   Complete by: As directed    1. Followup with your primary care provider(Dr. Lawerance Bach) within 1 week of discharge from the hospital 2. Do NOT take Ozempic until you have been evaluated by Dr. Lawerance Bach in clinic in 1 week 3. Stay on a soft/low residual diet until you have completed your antibiotics   Increase activity slowly   Complete by: As directed       Allergies as of 05/04/2023       Reactions   Cymbalta [duloxetine Hcl] Hives, Itching   Effexor [venlafaxine] Nausea Only   Hm Lidocaine Patch [lidocaine] Other (See Comments)   Blisters the skin   Linzess [linaclotide]    diarrhea   Other Other (See Comments)   "SEEDED" food due to stomach issues   Statins Nausea And Vomiting   Penicillins Hives, Rash   Has patient had a PCN reaction causing immediate rash, facial/tongue/throat swelling, SOB or lightheadedness with hypotension: Yes Has patient had a PCN reaction causing severe rash involving mucus membranes or skin necrosis: No Has patient had a PCN reaction that required hospitalization: No Has patient had a PCN reaction occurring within the last 10 years: Yes If all of the  above answers are "NO", then may proceed with Cephalosporin use.        Medication List     TAKE these medications    acetaminophen 500 MG tablet Commonly known as: TYLENOL Take 500 mg by mouth every 6 (six) hours as needed for headache (pain).   albuterol 108 (90 Base) MCG/ACT inhaler Commonly known as: VENTOLIN HFA Inhale 2 puffs into the lungs every 6 (six) hours as needed for wheezing or shortness of breath.   aspirin EC 81 MG tablet Take 1 tablet (81 mg total) by mouth daily.   cefUROXime 500 MG tablet Commonly known as: CEFTIN Take 1 tablet (500 mg total) by mouth 2 (two) times daily with a meal for 7 days.   clonazePAM 0.5 MG tablet Commonly known  as: KLONOPIN TAKE 1/2 TO 1 TABLET TWICE A DAY AS NEEDED FOR ANXIETY   dicyclomine 10 MG capsule Commonly known as: BENTYL TAKE 1 CAPSULE BY MOUTH 3 TIMES DAILY BEFORE MEALS *PLEASE SCHEDULE APPOINTMENT FOR FURTHER REFILLS* *REFILL REQUEST* What changed: See the new instructions.   docusate sodium 100 MG capsule Commonly known as: Colace Take 3 capsules (300 mg total) by mouth daily. What changed:  how much to take when to take this reasons to take this   ezetimibe 10 MG tablet Commonly known as: ZETIA TAKE ONE TABLET BY MOUTH ONCE DAILY   famotidine 40 MG tablet Commonly known as: PEPCID TAKE ONE TABLET BY MOUTH DAILY   fluconazole 150 MG tablet Commonly known as: DIFLUCAN TAKE 1 TABLET BY MOUTH EVERY 3 DAYS *REFILL REQUEST*   fluticasone 50 MCG/ACT nasal spray Commonly known as: FLONASE Place 2 sprays into both nostrils 2 (two) times daily as needed for allergies or rhinitis.   ketorolac 0.5 % ophthalmic solution Commonly known as: ACULAR Place 1 drop into the left eye 4 (four) times daily.   lubiprostone 24 MCG capsule Commonly known as: [name] TAKE ONE CAPSULE BY MOUTH TWICE DAILY WITH A MEAL What changed:  how much to take how to take this when to take this additional instructions    meclizine 12.5 MG tablet Commonly known as: ANTIVERT TAKE 1 TABLET BY MOUTH THREE TIMES A DAY AS NEEDED FOR DIZZINESS What changed: See the new instructions.   metroNIDAZOLE 500 MG tablet Commonly known as: Flagyl Take 1 tablet (500 mg total) by mouth 2 (two) times daily for 7 days.   multivitamin with minerals Tabs tablet Take 1 tablet by mouth daily after lunch.   nitroGLYCERIN 0.4 MG SL tablet Commonly known as: NITROSTAT DISSOLVE 1 TABLET UNDER THE TONGUE AS NEEDED FOR CHEST PAIN EVERY 5 MINUTES UP TO 3 TIMES. IF NO RELIEF CALL 911. *REFILL REQUEST* What changed: See the new instructions.   ofloxacin 0.3 % ophthalmic solution Commonly known as: OCUFLOX Place 1 drop into the left eye 4 (four) times daily.   omeprazole 40 MG capsule Commonly known as: PRILOSEC TAKE ONE CAPSULE BY MOUTH ONCE DAILY What changed:  when to take this reasons to take this   ondansetron 4 MG tablet Commonly known as: Zofran Take 1 tablet (4 mg total) by mouth every 8 (eight) hours as needed for nausea or vomiting.   OVER THE COUNTER MEDICATION Place 1 drop into both eyes 2 (two) times daily as needed (dry eyes/ pataday). Over the counter eye drop for itching   OXcarbazepine 150 MG tablet Commonly known as: TRILEPTAL TAKE 1 TABLET BY MOUTH TWICE DAILY   oxyCODONE 5 MG immediate release tablet Commonly known as: Oxy IR/ROXICODONE Take 1 tablet (5 mg total) by mouth every 6 (six) hours as needed for moderate pain (pain score 4-6).   Ozempic (1 MG/DOSE) 4 MG/3ML Sopn Generic drug: Semaglutide (1 MG/DOSE) Inject 1 mg into the skin every 7 (seven) days. Start taking on: May 15, 2023 What changed:  See the new instructions. These instructions start on May 15, 2023. If you are unsure what to do until then, ask your doctor or other care provider.   potassium chloride SA 20 MEQ tablet Commonly known as: KLOR-CON M TAKE 1 TABLET BY MOUTH 3 TIMES DAILY   prednisoLONE acetate 1 %  ophthalmic suspension Commonly known as: PRED FORTE Place 1 drop into the left eye 4 (four) times daily.   pregabalin 100 MG  capsule Commonly known as: LYRICA Take 1 capsule (100 mg total) by mouth 3 (three) times daily.   PROBIOTIC PO Take 1 capsule by mouth daily after breakfast.   REFRESH OP Place 1 drop into both eyes daily as needed (dry eyes).   rosuvastatin 5 MG tablet Commonly known as: CRESTOR TAKE ONE TABLET BY MOUTH EVERYDAY AT BEDTIME What changed: See the new instructions.   terconazole 0.8 % vaginal cream Commonly known as: TERAZOL 3 INSERT 1 APPLICATORFUL VAGINALLY FOR 3 DAYS AS NEEDED        Allergies  Allergen Reactions   Cymbalta [Duloxetine Hcl] Hives and Itching   Effexor [Venlafaxine] Nausea Only   Hm Lidocaine Patch [Lidocaine] Other (See Comments)    Blisters the skin   Linzess [Linaclotide]     diarrhea   Other Other (See Comments)    "SEEDED" food due to stomach issues   Statins Nausea And Vomiting   Penicillins Hives and Rash    Has patient had a PCN reaction causing immediate rash, facial/tongue/throat swelling, SOB or lightheadedness with hypotension: Yes Has patient had a PCN reaction causing severe rash involving mucus membranes or skin necrosis: No Has patient had a PCN reaction that required hospitalization: No Has patient had a PCN reaction occurring within the last 10 years: Yes If all of the above answers are "NO", then may proceed with Cephalosporin use.     Discharge Exam: Vitals:   05/04/23 0426 05/04/23 0738  BP: (!) 121/55 128/70  Pulse: (!) 53 (!) 56  Resp: 18 16  Temp: 98.1 F (36.7 C) (!) 97.3 F (36.3 C)  SpO2: 100% 100%    Physical Exam Vitals and nursing note reviewed.  Constitutional:      General: She is not in acute distress.    Appearance: She is not toxic-appearing or diaphoretic.  HENT:     Head: Normocephalic and atraumatic.     Nose: Nose normal.  Cardiovascular:     Rate and Rhythm: Normal rate  and regular rhythm.  Pulmonary:     Effort: Pulmonary effort is normal.     Breath sounds: Normal breath sounds.  Abdominal:     General: Bowel sounds are normal. There is no distension.     Palpations: Abdomen is soft.     Tenderness: There is no guarding or rebound.     Comments: Mild tenderness LLQ. No rebound or guarding  Skin:    General: Skin is warm and dry.     Capillary Refill: Capillary refill takes less than 2 seconds.  Neurological:     General: No focal deficit present.     Mental Status: She is alert and oriented to person, place, and time.     Comments: Slightly hard of hearing Very firm handshake!     The results of significant diagnostics from this hospitalization (including imaging, microbiology, ancillary and laboratory) are listed below for reference.    Microbiology: Recent Results (from the past 240 hour(s))  Resp panel by RT-PCR (RSV, Flu A&B, Covid) Anterior Nasal Swab     Status: None   Collection Time: 05/01/23  7:40 PM   Specimen: Anterior Nasal Swab  Result Value Ref Range Status   SARS Coronavirus 2 by RT PCR NEGATIVE NEGATIVE Final   Influenza A by PCR NEGATIVE NEGATIVE Final   Influenza B by PCR NEGATIVE NEGATIVE Final    Comment: (NOTE) The Xpert Xpress SARS-CoV-2/FLU/RSV plus assay is intended as an aid in the diagnosis of influenza from Nasopharyngeal  swab specimens and should not be used as a sole basis for treatment. Nasal washings and aspirates are unacceptable for Xpert Xpress SARS-CoV-2/FLU/RSV testing.  Fact Sheet for Patients: BloggerCourse.com  Fact Sheet for Healthcare Providers: SeriousBroker.it  This test is not yet approved or cleared by the [place] FDA and has been authorized for detection and/or diagnosis of SARS-CoV-2 by FDA under an Emergency Use Authorization (EUA). This EUA will remain in effect (meaning this test can be used) for the duration of the COVID-19  declaration under Section 564(b)(1) of the Act, 21 U.S.C. section 360bbb-3(b)(1), unless the authorization is terminated or revoked.     Resp Syncytial Virus by PCR NEGATIVE NEGATIVE Final    Comment: (NOTE) Fact Sheet for Patients: BloggerCourse.com  Fact Sheet for Healthcare Providers: SeriousBroker.it  This test is not yet approved or cleared by the [place] FDA and has been authorized for detection and/or diagnosis of SARS-CoV-2 by FDA under an Emergency Use Authorization (EUA). This EUA will remain in effect (meaning this test can be used) for the duration of the COVID-19 declaration under Section 564(b)(1) of the Act, 21 U.S.C. section 360bbb-3(b)(1), unless the authorization is terminated or revoked.  Performed at [facility], [address], [place], [place] [address]      Labs:  Basic Metabolic Panel: Recent Labs  Lab 05/01/23 1949 05/02/23 0547 05/02/23 0647 05/03/23 0520 05/04/23 0716  NA 135 137  --  139 137  K 3.8 3.3*  --  3.2* 3.6  CL 100 102  --  102 101  CO2 24 25  --  28 26  GLUCOSE 91 99  --  95 104*  BUN 10 7*  --  6* 9  CREATININE 0.78 0.70  --  0.81 0.67  CALCIUM 9.8 9.3  --  9.7 9.5  MG  --   --  1.8 2.1 1.9  PHOS  --   --  4.0 3.9 2.9   Liver Function Tests: Recent Labs  Lab 05/01/23 1949  AST 29  ALT 21  ALKPHOS 46  BILITOT 0.7  PROT 7.6  ALBUMIN 3.9   Recent Labs  Lab 05/01/23 1949  LIPASE 37    CBC: Recent Labs  Lab 05/01/23 1949 05/02/23 0547 05/03/23 0520 05/04/23 0716  WBC 9.2 7.3 5.7 4.7  NEUTROABS 5.8  --   --   --   HGB 12.9 11.7* 12.5 12.1  HCT 38.4 35.0* 38.2 36.7  MCV 89.3 87.5 89.7 91.3  PLT 250 218 235 238   CBG: Recent Labs  Lab 05/03/23 1723 05/03/23 2010 05/03/23 2328 05/04/23 0401 05/04/23 0823  GLUCAP 114* 114* 135* 91 88   Urinalysis    Component Value Date/Time   COLORURINE YELLOW 11/02/2022 1405   APPEARANCEUR CLEAR  11/02/2022 1405   LABSPEC 1.025 11/02/2022 1405   PHURINE 6.0 11/02/2022 1405   GLUCOSEU NEGATIVE 11/02/2022 1405   HGBUR NEGATIVE 11/02/2022 1405   BILIRUBINUR NEGATIVE 11/02/2022 1405   BILIRUBINUR neg 04/10/2017 1144   KETONESUR NEGATIVE 11/02/2022 1405   PROTEINUR NEGATIVE 08/09/2021 1123   UROBILINOGEN 0.2 11/02/2022 1405   NITRITE NEGATIVE 11/02/2022 1405   LEUKOCYTESUR NEGATIVE 11/02/2022 1405   Sepsis Labs Recent Labs  Lab 05/01/23 1949 05/02/23 0547 05/03/23 0520 05/04/23 0716  WBC 9.2 7.3 5.7 4.7   Microbiology Recent Results (from the past 240 hour(s))  Resp panel by RT-PCR (RSV, Flu A&B, Covid) Anterior Nasal Swab     Status: None   Collection Time: 05/01/23  7:40 PM   Specimen: Anterior Nasal Swab  Result Value Ref Range Status   SARS Coronavirus 2 by RT PCR NEGATIVE NEGATIVE Final   Influenza A by PCR NEGATIVE NEGATIVE Final   Influenza B by PCR NEGATIVE NEGATIVE Final    Comment: (NOTE) The Xpert Xpress SARS-CoV-2/FLU/RSV plus assay is intended as an aid in the diagnosis of influenza from Nasopharyngeal swab specimens and should not be used as a sole basis for treatment. Nasal washings and aspirates are unacceptable for Xpert Xpress SARS-CoV-2/FLU/RSV testing.  Fact Sheet for Patients: BloggerCourse.com  Fact Sheet for Healthcare Providers: SeriousBroker.it  This test is not yet approved or cleared by the [place] FDA and has been authorized for detection and/or diagnosis of SARS-CoV-2 by FDA under an Emergency Use Authorization (EUA). This EUA will remain in effect (meaning this test can be used) for the duration of the COVID-19 declaration under Section 564(b)(1) of the Act, 21 U.S.C. section 360bbb-3(b)(1), unless the authorization is terminated or revoked.     Resp Syncytial Virus by PCR NEGATIVE NEGATIVE Final    Comment: (NOTE) Fact Sheet for  Patients: BloggerCourse.com  Fact Sheet for Healthcare Providers: SeriousBroker.it  This test is not yet approved or cleared by the [place] FDA and has been authorized for detection and/or diagnosis of SARS-CoV-2 by FDA under an Emergency Use Authorization (EUA). This EUA will remain in effect (meaning this test can be used) for the duration of the COVID-19 declaration under Section 564(b)(1) of the Act, 21 U.S.C. section 360bbb-3(b)(1), unless the authorization is terminated or revoked.  Performed at [facility], [address] [place], [place] [address]     Procedures/Studies: CT ABDOMEN PELVIS W CONTRAST  Result Date: 05/01/2023 CLINICAL DATA:  Left upper quadrant pain EXAM: CT ABDOMEN AND PELVIS WITH CONTRAST TECHNIQUE: Multidetector CT imaging of the abdomen and pelvis was performed using the standard protocol following bolus administration of intravenous contrast. RADIATION DOSE REDUCTION: This exam was performed according to the departmental dose-optimization program which includes automated exposure control, adjustment of the mA and/or kV according to patient size and/or use of iterative reconstruction technique. CONTRAST:  75mL OMNIPAQUE IOHEXOL 350 MG/ML SOLN COMPARISON:  None Available. FINDINGS: Lower chest: No acute abnormality Hepatobiliary: No focal liver abnormality is seen. Status post cholecystectomy. No biliary dilatation. Pancreas: No focal abnormality or ductal dilatation. Spleen: No focal abnormality.  Normal size. Adrenals/Urinary Tract: No adrenal abnormality. No focal renal abnormality. No stones or hydronephrosis. Urinary bladder is unremarkable. Stomach/Bowel: Colonic diverticulosis. Slight haziness/inflammation around the proximal sigmoid colon compatible with early active diverticulitis. Stomach and small bowel decompressed, unremarkable. Vascular/Lymphatic: Aortic atherosclerosis. No evidence of  aneurysm or adenopathy. Reproductive: Prior hysterectomy.  No adnexal masses. Other: No free fluid or free air. Musculoskeletal: No acute bony abnormality. IMPRESSION: Colonic diverticulosis. Subtle inflammation around the proximal sigmoid colon compatible with early active diverticulitis. Aortic atherosclerosis. Electronically Signed   By: Charlett Nose M.D.   On: 05/01/2023 21:33    Time coordinating discharge: 40 mins  SIGNED:  Carollee Herter, DO [facility] 05/04/23, 9:16 AM

## 2023-05-04 NOTE — Assessment & Plan Note (Signed)
Stable. On klonopin prn.

## 2023-05-04 NOTE — Telephone Encounter (Signed)
Pharmacy Patient Advocate Encounter   Received notification that prior authorization for Ondansetron HCl 4MG  tablets is required/requested.   Insurance verification completed.   The patient is insured through [facility] ADVANTAGE/RX ADVANCE .   Per test claim: PA required; PA submitted to above mentioned insurance via CoverMyMeds Key/confirmation #/EOC [id number] Status is pending

## 2023-05-05 ENCOUNTER — Telehealth: Payer: Self-pay

## 2023-05-05 ENCOUNTER — Inpatient Hospital Stay: Admission: RE | Admit: 2023-05-05 | Payer: PPO | Source: Ambulatory Visit

## 2023-05-05 NOTE — Transitions of Care (Post Inpatient/ED Visit) (Signed)
   05/05/2023  Name: [name] MRN: [id number] DOB: 04/20/1941  Today's TOC FU Call Status: Today's TOC FU Call Status:: Unsuccessful Call (1st Attempt) Unsuccessful Call (1st Attempt) Date: 05/05/23  Attempted to reach the patient regarding the most recent Inpatient/ED visit.  Follow Up Plan: Additional outreach attempts will be made to reach the patient to complete the Transitions of Care (Post Inpatient/ED visit) call.   Jodelle Gross RN, BSN, CCM RN Care Manager  Transitions of Care  [facility]  [phone]

## 2023-05-07 NOTE — Patient Instructions (Addendum)
   Your A1c was checked today.     Flu immunization administered today.     Medications changes include :   continue to hold the ozempic.     Take tylenol as needed for pain - avoid the oxycodone (pain medication) if possible unless pain is severe since this causes constipation   Restart amitiza for the constipation if you are not already taking it - one pill twice a day - new prescription sent to pharmacy.   You can try taking magnesium glycinate 350 mg for the constipation     Return in about 6 months (around 11/05/2023) for Physical Exam.

## 2023-05-07 NOTE — Progress Notes (Unsigned)
Subjective:    Patient ID: [name], female    DOB: [birth year], 82 y.o.   MRN: [id number]     HPI [name] is here for follow up from the hospital.     Admitted 05/01/23 - 05/04/23 for diverticulitis  Recommendations for Outpatient Follow-up:  Follow up with PCP in 1-2 weeks Hold ozempic until seen by PCP in office to restart after diverticulitis treatment has completed  Presented with LLQ abdominal pain which started a few days prior.  It was progressively getting worse.  She had nausea but no vomiting, fever, melena or hematochezia.    In ED she was febrile.  Cbc, cmp, lipase normal.  Ct concerning for diverticulitis.  Received fentanyl, zofran, cipro and flagyl in ED  Diverticulitis -  Treated with IV levaquin and flagyl Tolerated soft/low residual diet Oxycodone for pain Stable - d/c to home 7 additional days of abx - total 10 days of abx Hold ozempic for now  CAD, DM, anxiety, seizure d/o, hypokalemia -  stable    She is taking the antibiotics.  She denies abdominal pain.  She does have some bloating.    Taking oxycodone as needed.  Her last BM was at least 8 days ago.      Medications and allergies reviewed with patient and updated if appropriate.  Current Outpatient Medications on File Prior to Visit  Medication Sig Dispense Refill   acetaminophen (TYLENOL) 500 MG tablet Take 500 mg by mouth every 6 (six) hours as needed for headache (pain).     albuterol (VENTOLIN HFA) 108 (90 Base) MCG/ACT inhaler Inhale 2 puffs into the lungs every 6 (six) hours as needed for wheezing or shortness of breath.     aspirin EC 81 MG tablet Take 1 tablet (81 mg total) by mouth daily. 90 tablet 3   cefUROXime (CEFTIN) 500 MG tablet Take 1 tablet (500 mg total) by mouth 2 (two) times daily with a meal for 7 days. 14 tablet 0   clonazePAM (KLONOPIN) 0.5 MG tablet TAKE 1/2 TO 1 TABLET TWICE A DAY AS NEEDED FOR ANXIETY 60 tablet 3   dicyclomine (BENTYL) 10 MG capsule  TAKE 1 CAPSULE BY MOUTH 3 TIMES DAILY BEFORE MEALS *PLEASE SCHEDULE APPOINTMENT FOR FURTHER REFILLS* *REFILL REQUEST* (Patient taking differently: Take 10 mg by mouth 2 (two) times daily before a meal.) 90 capsule 10   docusate sodium (COLACE) 100 MG capsule Take 3 capsules (300 mg total) by mouth daily. (Patient taking differently: Take 100 mg by mouth as needed for mild constipation.) 90 capsule 11   ezetimibe (ZETIA) 10 MG tablet TAKE ONE TABLET BY MOUTH ONCE DAILY 90 tablet 1   famotidine (PEPCID) 40 MG tablet TAKE ONE TABLET BY MOUTH DAILY 30 tablet 5   fluticasone (FLONASE) 50 MCG/ACT nasal spray Place 2 sprays into both nostrils 2 (two) times daily as needed for allergies or rhinitis.     ketorolac (ACULAR) 0.5 % ophthalmic solution Place 1 drop into the left eye 4 (four) times daily.     lubiprostone ([name]) 24 MCG capsule TAKE ONE CAPSULE BY MOUTH TWICE DAILY WITH A MEAL (Patient taking differently: Take 24 mcg by mouth 2 (two) times daily with a meal.) 180 capsule 3   meclizine (ANTIVERT) 12.5 MG tablet TAKE 1 TABLET BY MOUTH THREE TIMES A DAY AS NEEDED FOR DIZZINESS (Patient taking differently: Take 12.5 mg by mouth 3 (three) times daily as needed.) 30 tablet 0   metroNIDAZOLE (FLAGYL)  500 MG tablet Take 1 tablet (500 mg total) by mouth 2 (two) times daily for 7 days. 14 tablet 0   Multiple Vitamin (MULTIVITAMIN WITH MINERALS) TABS tablet Take 1 tablet by mouth daily after lunch.     nitroGLYCERIN (NITROSTAT) 0.4 MG SL tablet DISSOLVE 1 TABLET UNDER THE TONGUE AS NEEDED FOR CHEST PAIN EVERY 5 MINUTES UP TO 3 TIMES. IF NO RELIEF CALL 911. *REFILL REQUEST* (Patient taking differently: Place 0.4 mg under the tongue every 5 (five) minutes as needed for chest pain (Up to 3 times). Keep on hand for when needed) 25 tablet 10   ofloxacin (OCUFLOX) 0.3 % ophthalmic solution Place 1 drop into the left eye 4 (four) times daily.     omeprazole (PRILOSEC) 40 MG capsule TAKE ONE CAPSULE BY MOUTH ONCE  DAILY (Patient taking differently: Take 40 mg by mouth as needed.) 90 capsule 1   ondansetron (ZOFRAN) 4 MG tablet Take 1 tablet (4 mg total) by mouth every 8 (eight) hours as needed for nausea or vomiting. 30 tablet 0   OVER THE COUNTER MEDICATION Place 1 drop into both eyes 2 (two) times daily as needed (dry eyes/ pataday). Over the counter eye drop for itching     OXcarbazepine (TRILEPTAL) 150 MG tablet TAKE 1 TABLET BY MOUTH TWICE DAILY 60 tablet 10   oxyCODONE (OXY IR/ROXICODONE) 5 MG immediate release tablet Take 1 tablet (5 mg total) by mouth every 6 (six) hours as needed for moderate pain (pain score 4-6). 20 tablet 0   Polyvinyl Alcohol-Povidone (REFRESH OP) Place 1 drop into both eyes daily as needed (dry eyes).     potassium chloride SA (KLOR-CON M) 20 MEQ tablet TAKE 1 TABLET BY MOUTH 3 TIMES DAILY 90 tablet 10   prednisoLONE acetate (PRED FORTE) 1 % ophthalmic suspension Place 1 drop into the left eye 4 (four) times daily.     pregabalin (LYRICA) 100 MG capsule Take 1 capsule (100 mg total) by mouth 3 (three) times daily. 90 capsule 0   Probiotic Product (PROBIOTIC PO) Take 1 capsule by mouth daily after breakfast.     rosuvastatin (CRESTOR) 5 MG tablet TAKE ONE TABLET BY MOUTH EVERYDAY AT BEDTIME (Patient taking differently: Take 5 mg by mouth at bedtime.) 90 tablet 4   [START ON 05/15/2023] Semaglutide, 1 MG/DOSE, (OZEMPIC, 1 MG/DOSE,) 4 MG/3ML SOPN Inject 1 mg into the skin every 7 (seven) days.     terconazole (TERAZOL 3) 0.8 % vaginal cream INSERT 1 APPLICATORFUL VAGINALLY FOR 3 DAYS AS NEEDED 20 g 10   [DISCONTINUED] Calcium Carbonate (CALCIUM 500 PO) Take 1 capsule by mouth every other day.      No current facility-administered medications on file prior to visit.     Review of Systems  Constitutional:  Positive for appetite change (decreased). Negative for fever.  Respiratory:  Positive for shortness of breath (maybe a little). Negative for cough and wheezing.    Cardiovascular:  Positive for palpitations (occ). Negative for chest pain and leg swelling.  Gastrointestinal:  Positive for abdominal distention, constipation and nausea. Negative for abdominal pain.       No gerd  Neurological:  Positive for light-headedness (mild) and headaches (mild).       Objective:   Vitals:   05/08/23 1500  BP: 118/74  Pulse: 95  Temp: 98.1 F (36.7 C)  SpO2: 96%   BP Readings from Last 3 Encounters:  05/08/23 118/74  05/04/23 128/70  11/02/22 116/78   Wt Readings from  Last 3 Encounters:  05/08/23 151 lb (68.5 kg)  05/04/23 152 lb 8.9 oz (69.2 kg)  11/02/22 164 lb (74.4 kg)   Body mass index is 27.62 kg/m.    Physical Exam Constitutional:      General: She is not in acute distress.    Appearance: Normal appearance. She is not ill-appearing.  HENT:     Head: Normocephalic and atraumatic.  Cardiovascular:     Rate and Rhythm: Normal rate and regular rhythm.  Pulmonary:     Effort: Pulmonary effort is normal. No respiratory distress.     Breath sounds: Normal breath sounds. No wheezing or rales.  Abdominal:     General: There is no distension.     Palpations: Abdomen is soft.     Tenderness: There is abdominal tenderness (Tenderness in left lower quadrant, also tenderness across upper abdomen but is more mild). There is no guarding or rebound.  Musculoskeletal:     Right lower leg: No edema.     Left lower leg: No edema.  Skin:    General: Skin is warm and dry.  Neurological:     Mental Status: She is alert.  Psychiatric:        Mood and Affect: Mood normal.        Lab Results  Component Value Date   WBC 4.7 05/04/2023   HGB 12.1 05/04/2023   HCT 36.7 05/04/2023   PLT 238 05/04/2023   GLUCOSE 104 (H) 05/04/2023   CHOL 231 (H) 11/02/2022   TRIG 138.0 11/02/2022   HDL 68.00 11/02/2022   LDLDIRECT 113.0 07/21/2017   LDLCALC 136 (H) 11/02/2022   ALT 21 05/01/2023   AST 29 05/01/2023   NA 137 05/04/2023   K 3.6 05/04/2023    CL 101 05/04/2023   CREATININE 0.67 05/04/2023   BUN 9 05/04/2023   CO2 26 05/04/2023   TSH 1.39 02/06/2019   INR 1.00 01/03/2017   HGBA1C 6.8 (H) 11/02/2022   MICROALBUR 7.5 (H) 11/02/2022     Assessment & Plan:    See Problem List for Assessment and Plan of chronic medical problems.

## 2023-05-08 ENCOUNTER — Ambulatory Visit (INDEPENDENT_AMBULATORY_CARE_PROVIDER_SITE_OTHER): Payer: PPO | Admitting: Internal Medicine

## 2023-05-08 ENCOUNTER — Telehealth: Payer: Self-pay

## 2023-05-08 ENCOUNTER — Encounter: Payer: Self-pay | Admitting: Internal Medicine

## 2023-05-08 ENCOUNTER — Other Ambulatory Visit: Payer: Self-pay | Admitting: Internal Medicine

## 2023-05-08 ENCOUNTER — Other Ambulatory Visit (HOSPITAL_COMMUNITY): Payer: Self-pay

## 2023-05-08 VITALS — BP 118/74 | HR 95 | Temp 98.1°F | Ht 62.0 in | Wt 151.0 lb

## 2023-05-08 DIAGNOSIS — I2510 Atherosclerotic heart disease of native coronary artery without angina pectoris: Secondary | ICD-10-CM | POA: Diagnosis not present

## 2023-05-08 DIAGNOSIS — E785 Hyperlipidemia, unspecified: Secondary | ICD-10-CM

## 2023-05-08 DIAGNOSIS — E1142 Type 2 diabetes mellitus with diabetic polyneuropathy: Secondary | ICD-10-CM | POA: Diagnosis not present

## 2023-05-08 DIAGNOSIS — K5900 Constipation, unspecified: Secondary | ICD-10-CM | POA: Diagnosis not present

## 2023-05-08 DIAGNOSIS — K5732 Diverticulitis of large intestine without perforation or abscess without bleeding: Secondary | ICD-10-CM

## 2023-05-08 DIAGNOSIS — Z23 Encounter for immunization: Secondary | ICD-10-CM

## 2023-05-08 DIAGNOSIS — F418 Other specified anxiety disorders: Secondary | ICD-10-CM

## 2023-05-08 LAB — POCT GLYCOSYLATED HEMOGLOBIN (HGB A1C)
HbA1c POC (<> result, manual entry): 6.1 % (ref 4.0–5.6)
HbA1c, POC (controlled diabetic range): 6.1 % (ref 0.0–7.0)
HbA1c, POC (prediabetic range): 6.1 % (ref 5.7–6.4)
Hemoglobin A1C: 6.1 % — AB (ref 4.0–5.6)

## 2023-05-08 MED ORDER — LUBIPROSTONE 24 MCG PO CAPS: ORAL_CAPSULE | ORAL | 1 refills | Status: DC

## 2023-05-08 NOTE — Assessment & Plan Note (Signed)
Chronic Regular exercise and healthy diet encouraged Continue Zetia 10 mg daily, Crestor 5 mg daily

## 2023-05-08 NOTE — Assessment & Plan Note (Addendum)
Chronic A1c today-  Lab Results  Component Value Date   HGBA1C 6.1 (A) 05/08/2023   HGBA1C 6.1 05/08/2023   HGBA1C 6.1 05/08/2023   HGBA1C 6.1 05/08/2023   Sugars controlled Been off Ozempic Given decreased appetite and constipation continue to hold Ozempic

## 2023-05-08 NOTE — Assessment & Plan Note (Addendum)
Acute On flagyl, Ceftin-advised to complete full course Taking oxycodone as needed for pain-advised to only take for severe pain since it can cause constipation Take Tylenol for mild-moderate pain Continue low fiber diet for now

## 2023-05-08 NOTE — Assessment & Plan Note (Signed)
Chronic Following with cardiology Having some chest pain - has discussed with cardiology Continue aspirin 81 mg daily, Crestor 5 mg daily, Zetia 10 mg daily

## 2023-05-08 NOTE — Transitions of Care (Post Inpatient/ED Visit) (Signed)
   05/08/2023  Name: [name] MRN: [id number] DOB: [birth year]  Today's TOC FU Call Status: Today's TOC FU Call Status:: Unsuccessful Call (2nd Attempt) Unsuccessful Call (2nd Attempt) Date: 05/08/23  Attempted to reach the patient regarding the most recent Inpatient/ED visit.  Follow Up Plan: Additional outreach attempts will be made to reach the patient to complete the Transitions of Care (Post Inpatient/ED visit) call.   Jodelle Gross RN, BSN, CCM RN Care Manager  Transitions of Care  [facility]  [phone]

## 2023-05-08 NOTE — Assessment & Plan Note (Addendum)
Chronic Taking stool softener daily - advised to take max dose Taking miralax daily - just started - take daily Restart amitiza twice daily Can add magnesium daily Continue increased water intake Keep moving

## 2023-05-08 NOTE — Assessment & Plan Note (Signed)
Chronic Controlled, Stable Continue clonazepam 0.25-0.5 mg twice daily as needed - does not take often

## 2023-05-11 ENCOUNTER — Other Ambulatory Visit (HOSPITAL_COMMUNITY): Payer: Self-pay

## 2023-05-12 DIAGNOSIS — H25812 Combined forms of age-related cataract, left eye: Secondary | ICD-10-CM | POA: Diagnosis not present

## 2023-05-14 ENCOUNTER — Other Ambulatory Visit: Payer: Self-pay | Admitting: Internal Medicine

## 2023-05-14 DIAGNOSIS — I2510 Atherosclerotic heart disease of native coronary artery without angina pectoris: Secondary | ICD-10-CM

## 2023-05-16 ENCOUNTER — Ambulatory Visit (INDEPENDENT_AMBULATORY_CARE_PROVIDER_SITE_OTHER): Payer: PPO | Admitting: *Deleted

## 2023-05-16 DIAGNOSIS — Z0000 Encounter for general adult medical examination without abnormal findings: Secondary | ICD-10-CM | POA: Diagnosis not present

## 2023-05-16 NOTE — Progress Notes (Signed)
Subjective:   [name] is a 82 y.o. female who presents for Medicare Annual (Subsequent) preventive examination.  Visit Complete: Virtual I connected with  Jodie Echevaria on 05/16/23 by a audio enabled telemedicine application and verified that I am speaking with the correct person using two identifiers.  Patient Location: Home  Provider Location: Home Office  I discussed the limitations of evaluation and management by telemedicine. The patient expressed understanding and agreed to proceed.  Vital Signs: Because this visit was a virtual/telehealth visit, some criteria may be missing or patient reported. Any vitals not documented were not able to be obtained and vitals that have been documented are patient reported.  Cardiac Risk Factors include: advanced age (>55men, >57 women);diabetes mellitus;family history of premature cardiovascular disease     Objective:    There were no vitals filed for this visit. There is no height or weight on file to calculate BMI.     05/16/2023    1:53 PM 05/02/2023    3:51 AM 05/01/2023    6:28 PM 05/02/2022    2:19 PM 08/09/2021   11:21 AM 04/28/2021    4:12 PM 01/06/2020    2:30 AM  Advanced Directives  Does Patient Have a Medical Advance Directive? Yes Yes Yes Yes Yes No Yes  Type of Advance Directive Healthcare Power of Attorney Living will Living will Healthcare Power of [place];Living will   Living will;Healthcare Power of Attorney  Does patient want to make changes to medical advance directive?  No - Patient declined     No - Patient declined  Copy of Healthcare Power of Attorney in Chart? Yes - validated most recent copy scanned in chart (See row information)   No - copy requested   No - copy requested  Would patient like information on creating a medical advance directive?    No - Patient declined  No - Patient declined     Current Medications (verified) Outpatient Encounter Medications as of 05/16/2023  Medication Sig    acetaminophen (TYLENOL) 500 MG tablet Take 500 mg by mouth every 6 (six) hours as needed for headache (pain).   albuterol (VENTOLIN HFA) 108 (90 Base) MCG/ACT inhaler Inhale 2 puffs into the lungs every 6 (six) hours as needed for wheezing or shortness of breath.   aspirin EC 81 MG tablet Take 1 tablet (81 mg total) by mouth daily.   clonazePAM (KLONOPIN) 0.5 MG tablet TAKE 1/2 TO 1 TABLET TWICE A DAY AS NEEDED FOR ANXIETY   dicyclomine (BENTYL) 10 MG capsule TAKE 1 CAPSULE BY MOUTH 3 TIMES DAILY BEFORE MEALS *PLEASE SCHEDULE APPOINTMENT FOR FURTHER REFILLS* *REFILL REQUEST* (Patient taking differently: Take 10 mg by mouth 2 (two) times daily before a meal.)   docusate sodium (COLACE) 100 MG capsule Take 3 capsules (300 mg total) by mouth daily. (Patient taking differently: Take 100 mg by mouth as needed for mild constipation.)   ezetimibe (ZETIA) 10 MG tablet TAKE 1 TABLET BY MOUTH ONCE DAILY   famotidine (PEPCID) 40 MG tablet TAKE ONE TABLET BY MOUTH DAILY   fluticasone (FLONASE) 50 MCG/ACT nasal spray Place 2 sprays into both nostrils 2 (two) times daily as needed for allergies or rhinitis.   ketorolac (ACULAR) 0.5 % ophthalmic solution Place 1 drop into the left eye 4 (four) times daily.   lubiprostone ([name]) 24 MCG capsule TAKE ONE CAPSULE BY MOUTH TWICE DAILY WITH A MEAL   meclizine (ANTIVERT) 12.5 MG tablet TAKE 1 TABLET BY MOUTH THREE TIMES  A DAY AS NEEDED FOR DIZZINESS (Patient taking differently: Take 12.5 mg by mouth 3 (three) times daily as needed.)   Multiple Vitamin (MULTIVITAMIN WITH MINERALS) TABS tablet Take 1 tablet by mouth daily after lunch.   nitroGLYCERIN (NITROSTAT) 0.4 MG SL tablet DISSOLVE 1 TABLET UNDER THE TONGUE AS NEEDED FOR CHEST PAIN EVERY 5 MINUTES UP TO 3 TIMES. IF NO RELIEF CALL 911. *REFILL REQUEST* (Patient taking differently: Place 0.4 mg under the tongue every 5 (five) minutes as needed for chest pain (Up to 3 times). Keep on hand for when needed)   ofloxacin  (OCUFLOX) 0.3 % ophthalmic solution Place 1 drop into the left eye 4 (four) times daily.   omeprazole (PRILOSEC) 40 MG capsule TAKE ONE CAPSULE BY MOUTH ONCE DAILY (Patient taking differently: Take 40 mg by mouth as needed.)   ondansetron (ZOFRAN) 4 MG tablet Take 1 tablet (4 mg total) by mouth every 8 (eight) hours as needed for nausea or vomiting.   OVER THE COUNTER MEDICATION Place 1 drop into both eyes 2 (two) times daily as needed (dry eyes/ pataday). Over the counter eye drop for itching   OXcarbazepine (TRILEPTAL) 150 MG tablet TAKE 1 TABLET BY MOUTH TWICE DAILY   oxyCODONE (OXY IR/ROXICODONE) 5 MG immediate release tablet Take 1 tablet (5 mg total) by mouth every 6 (six) hours as needed for moderate pain (pain score 4-6).   Polyvinyl Alcohol-Povidone (REFRESH OP) Place 1 drop into both eyes daily as needed (dry eyes).   potassium chloride SA (KLOR-CON M) 20 MEQ tablet TAKE 1 TABLET BY MOUTH 3 TIMES DAILY   prednisoLONE acetate (PRED FORTE) 1 % ophthalmic suspension Place 1 drop into the left eye 4 (four) times daily.   pregabalin (LYRICA) 100 MG capsule TAKE ONE (1) CAPSULE BY MOUTH 3 TIMES DAILY   Probiotic Product (PROBIOTIC PO) Take 1 capsule by mouth daily after breakfast.   rosuvastatin (CRESTOR) 5 MG tablet TAKE ONE TABLET BY MOUTH EVERYDAY AT BEDTIME (Patient taking differently: Take 5 mg by mouth at bedtime.)   terconazole (TERAZOL 3) 0.8 % vaginal cream INSERT 1 APPLICATORFUL VAGINALLY FOR 3 DAYS AS NEEDED   [DISCONTINUED] Calcium Carbonate (CALCIUM 500 PO) Take 1 capsule by mouth every other day.    No facility-administered encounter medications on file as of 05/16/2023.    Allergies (verified) Cymbalta [duloxetine hcl], Effexor [venlafaxine], Hm lidocaine patch [lidocaine], Linzess [linaclotide], Other, Statins, and Penicillins   History: Past Medical History:  Diagnosis Date   Abdominal distension, gaseous 04/10/2017   Allergic rhinitis 03/13/2013   CAD (coronary artery  disease) 05/28/2018   Cerebrovascular disease    Chest pain 10/12/2016   Degeneration of lumbar intervertebral disc 08/02/2017   Diabetic neuropathy 05/12/2014   Did not tolerated cymbalta - hives, itching Tried nortriptyline - not effective Did not tolerate Effexor-nausea gabapentin-not effective    Diverticulitis of large intestine 05/12/2014   Dyslipidemia    Gastroesophageal reflux disease 03/18/2009   Hearing loss 02/01/2017   Hypercalcemia 10/12/2016   Hypertension    Hypokalemia 01/06/2020   Insomnia 03/20/2009   Irritable bowel syndrome 03/20/2009   With chronic constipation   Laryngopharyngeal reflux (LPR) 09/27/2016   Lumbar pain 08/23/2016   Mild neurocognitive disorder due to multiple etiologies 11/06/2020   Myofascial pain 05/19/2015   Neck pain 09/27/2016   Non-toxic multinodular goiter 05/18/2009   Obstructive sleep apnea 03/18/2009   Not using CPAP   Osteoarthritis of left knee 01/12/2017   Osteoarthritis of shoulder region  Pain in joint of right shoulder 01/21/2019   Presbycusis of both ears 09/27/2016   Seizure disorder    Situational anxiety    Spondylolisthesis 09/20/2017   Steatosis of liver 05/12/2014   Type 2 diabetes mellitus 05/12/2014   Unstable angina 01/05/2020   Vertigo 04/13/2009   Past Surgical History:  Procedure Laterality Date   ABDOMINAL HYSTERECTOMY  1970's   Partial   APPENDECTOMY     CHOLECYSTECTOMY     LEFT HEART CATH AND CORONARY ANGIOGRAPHY N/A 05/28/2018   Procedure: LEFT HEART CATH AND CORONARY ANGIOGRAPHY;  Surgeon: Lyn Records, MD;  Location: [facility];  Service: Cardiovascular;  Laterality: N/A;   LEFT HEART CATH AND CORONARY ANGIOGRAPHY N/A 01/06/2020   Procedure: LEFT HEART CATH AND CORONARY ANGIOGRAPHY;  Surgeon: Lyn Records, MD;  Location: [facility];  Service: Cardiovascular;  Laterality: N/A;   LUMBAR EPIDURAL INJECTION Left 08/25/2017   SHOULDER SURGERY  2008   LT, post fall    TONSILLECTOMY AND ADENOIDECTOMY      TOTAL KNEE ARTHROPLASTY Left 01/12/2017   Procedure: LEFT TOTAL KNEE ARTHROPLASTY;  Surgeon: Jene Every, MD;  Location: [facility];  Service: Orthopedics;  Laterality: Left;  120 mins   Family History  Problem Relation Age of Onset   Arthritis Mother    Heart disease Father    Arthritis Other        Grandmother   Diabetes Other        Grandmother   Colon cancer Neg Hx    Stomach cancer Neg Hx    Pancreatic cancer Neg Hx    Social History   Socioeconomic History   Marital status: Widowed    Spouse name: Not on file   Number of children: 2   Years of education: 16   Highest education level: Bachelor's degree (e.g., BA, AB, BS)  Occupational History   Occupation: Retired    Comment: admin at [employer]  Tobacco Use   Smoking status: Former    Current packs/day: 0.00    Types: Cigarettes    Quit date: 10/19/1985    Years since quitting: 37.5   Smokeless tobacco: Never  Vaping Use   Vaping status: Never Used  Substance and Sexual Activity   Alcohol use: Yes    Alcohol/week: 0.0 standard drinks of alcohol    Comment: rare glass of wine   Drug use: No   Sexual activity: Never  Other Topics Concern   Not on file  Social History Narrative   Patient lives in a one story home.  Has 2 children.  Retired from [employer].   Social Determinants of Health   Financial Resource Strain: Low Risk  (05/16/2023)   Overall Financial Resource Strain (CARDIA)    Difficulty of Paying Living Expenses: Not hard at all  Food Insecurity: No Food Insecurity (05/16/2023)   Hunger Vital Sign    Worried About Running Out of Food in the Last Year: Never true    Ran Out of Food in the Last Year: Never true  Transportation Needs: No Transportation Needs (05/16/2023)   PRAPARE - Administrator, Civil Service (Medical): No    Lack of Transportation (Non-Medical): No  Physical Activity: Insufficiently Active (05/16/2023)   Exercise Vital Sign    Days of Exercise per Week: 3 days    Minutes of  Exercise per Session: 30 min  Stress: No Stress Concern Present (05/16/2023)   [employer] of Occupational Health - Occupational Stress  Questionnaire    Feeling of Stress : Not at all  Social Connections: Moderately Integrated (05/16/2023)   Social Connection and Isolation Panel [NHANES]    Frequency of Communication with Friends and Family: More than three times a week    Frequency of Social Gatherings with Friends and Family: Once a week    Attends Religious Services: More than 4 times per year    Active Member of [employer] or Organizations: Yes    Attends Banker Meetings: 1 to 4 times per year    Marital Status: Widowed    Tobacco Counseling Counseling given: Not Answered   Clinical Intake:  Pre-visit preparation completed: Yes  Pain : No/denies pain     Diabetes: Yes CBG done?: No Did pt. bring in CBG monitor from home?: No  How often do you need to have someone help you when you read instructions, pamphlets, or other written materials from your doctor or pharmacy?: 1 - Never  Interpreter Needed?: No  Information entered by :: Remi Haggard LPN   Activities of Daily Living    05/16/2023    2:01 PM 05/02/2023    3:51 AM  In your present state of health, do you have any difficulty performing the following activities:  Hearing? 1 1  Comment  HOH  Vision? 0 0  Difficulty concentrating or making decisions? 0 0  Walking or climbing stairs? 1   Dressing or bathing? 0   Doing errands, shopping? 1 1  Preparing Food and eating ? N   Using the Toilet? N   In the past six months, have you accidently leaked urine? N   Do you have problems with loss of bowel control? Y   Managing your Medications? N   Managing your Finances? N   Housekeeping or managing your Housekeeping? N     Patient Care Team: Pincus Sanes, MD as PCP - General (Internal Medicine) Jene Every, MD (Orthopedic Surgery) Serena Colonel, MD Fenton Malling, MD  (Neurology) Jonelle Sidle, MD (Cardiology) Coralyn Helling, MD (Inactive) (Pulmonary Disease) Maeola Harman, MD (Neurosurgery) Cherlyn Roberts, MD (Dermatology) Dione Booze, Bertram Millard, MD as Consulting Physician (Ophthalmology) Erlene Quan, Vinnie Level, [facility] (Inactive) (Pharmacist)  Indicate any recent Medical Services you may have received from other than [facility] providers in the past year (date may be approximate).     Assessment:   This is a routine wellness examination for Eisele.  Hearing/Vision screen Hearing Screening - Comments:: Does have trouble hearing  No hearing aids  Vision Screening - Comments:: Cataract out of left eye  groat    Goals Addressed             This Visit's Progress    Patient Stated       To be happy and healthy       Depression Screen    05/16/2023    2:00 PM 08/04/2022   10:41 AM 05/02/2022    2:35 PM 03/14/2022    1:25 PM 03/14/2022   11:05 AM 08/13/2021   10:09 AM 08/13/2021   10:08 AM  PHQ 2/9 Scores  PHQ - 2 Score 0 0 0 0 0 0 1  PHQ- 9 Score 0 2     2    Fall Risk    08/04/2022   10:40 AM 05/02/2022    2:20 PM 03/14/2022   11:01 AM 08/13/2021   10:07 AM 04/28/2021    4:14 PM  Fall Risk   Falls in the past year? 0 0  0 0 0  Number falls in past yr: 0 0 0 0 0  Injury with Fall? 0 0 0 0 0  Risk for fall due to : No Fall Risks Impaired balance/gait;Impaired mobility;Impaired vision;History of fall(s) No Fall Risks No Fall Risks No Fall Risks  Follow up Falls evaluation completed Falls evaluation completed Falls evaluation completed Falls evaluation completed Falls evaluation completed    MEDICARE RISK AT HOME: Medicare Risk at Home Any stairs in or around the home?: No If so, are there any without handrails?: No Home free of loose throw rugs in walkways, pet beds, electrical cords, etc?: Yes Adequate lighting in your home to reduce risk of falls?: Yes Life alert?: No Use of a cane, walker or w/c?: Yes Grab bars in the bathroom?:  Yes Shower chair or bench in shower?: No Elevated toilet seat or a handicapped toilet?: Yes  TIMED UP AND GO:  Was the test performed?  No    Cognitive Function:    11/05/2018   11:56 AM 10/11/2017    4:51 PM  MMSE - Mini Mental State Exam  Orientation to time 5 5  Orientation to Place 5 5  Registration 3 3  Attention/ Calculation 4 5  Recall 3 2  Language- name 2 objects 2 2  Language- repeat 1 1  Language- follow 3 step command 3 3  Language- read & follow direction 1 1  Write a sentence 1 1  Copy design 1 1  Total score 29 29        05/16/2023    1:56 PM 05/02/2022    2:21 PM  6CIT Screen  What Year? 0 points 0 points  What month? 0 points 0 points  What time? 0 points 0 points  Count back from 20 0 points 0 points  Months in reverse 0 points 0 points  Repeat phrase 4 points 0 points  Total Score 4 points 0 points    Immunizations Immunization History  Administered Date(s) Administered   Fluad Quad(high Dose 65+) 03/20/2019, 03/14/2022   Fluad Trivalent(High Dose 65+) 05/08/2023   Influenza Split 03/28/2011, 02/26/2012, 03/20/2013   Influenza Whole 02/25/2009, 04/01/2010   Influenza, High Dose Seasonal PF 03/03/2017, 05/28/2018, 03/12/2020   Influenza-Unspecified 09/05/2014, 03/27/2016, 02/25/2017, 03/05/2021   PFIZER Comirnaty(Gray Top)Covid-19 Tri-Sucrose Vaccine 11/15/2020   PFIZER(Purple Top)SARS-COV-2 Vaccination 07/16/2019, 08/06/2019, 03/23/2020   Pneumococcal Conjugate-13 09/14/2015   Pneumococcal Polysaccharide-23 06/28/2007, 10/11/2017   Tetanus 05/02/2012   Unspecified SARS-COV-2 Vaccination 04/26/2022   Zoster Recombinant(Shingrix) 01/27/2020, 05/19/2020    TDAP status: Due, Education has been provided regarding the importance of this vaccine. Advised may receive this vaccine at local pharmacy or Health Dept. Aware to provide a copy of the vaccination record if obtained from local pharmacy or Health Dept. Verbalized acceptance and  understanding.  Flu Vaccine status: Up to date  Pneumococcal vaccine status: Up to date  Covid-19 vaccine status: Declined, Education has been provided regarding the importance of this vaccine but patient still declined. Advised may receive this vaccine at local pharmacy or Health Dept.or vaccine clinic. Aware to provide a copy of the vaccination record if obtained from local pharmacy or Health Dept. Verbalized acceptance and understanding.  Qualifies for Shingles Vaccine? Yes   Zostavax completed No   Shingrix Completed?: Yes  Screening Tests Health Maintenance  Topic Date Due   OPHTHALMOLOGY EXAM  11/01/2019   DEXA SCAN  01/17/2021   FOOT EXAM  05/17/2023 (Originally 04/26/2023)   COVID-19 Vaccine (6 - 2023-24 season) 06/01/2023 (  Originally 02/26/2023)   DTaP/Tdap/Td (1 - Tdap) 05/15/2024 (Originally 05/03/2012)   Diabetic kidney evaluation - Urine ACR  11/02/2023   HEMOGLOBIN A1C  11/05/2023   Diabetic kidney evaluation - eGFR measurement  05/03/2024   Medicare Annual Wellness (AWV)  05/15/2024   Pneumonia Vaccine 68+ Years old  Completed   INFLUENZA VACCINE  Completed   Zoster Vaccines- Shingrix  Completed   HPV VACCINES  Aged Out   Colonoscopy  Discontinued   Hepatitis C Screening  Discontinued    Health Maintenance  Health Maintenance Due  Topic Date Due   OPHTHALMOLOGY EXAM  11/01/2019   DEXA SCAN  01/17/2021    Colorectal cancer screening: No longer required.   Mammogram status: No longer required due to  .  Bone Density status: Completed  . Results reflect: Bone density results: OSTEOPOROSIS. Repeat every 2 years.  Lung Cancer Screening: (Low Dose CT Chest recommended if Age 29-80 years, 20 pack-year currently smoking OR have quit w/in 15years.) does not qualify.   Lung Cancer Screening Referral:   Additional Screening:  Hepatitis C Screening  never done  Vision Screening: Recommended annual ophthalmology exams for early detection of glaucoma and other  disorders of the eye. Is the patient up to date with their annual eye exam?  Yes  Who is the provider or what is the name of the office in which the patient attends annual eye exams? [facility] If pt is not established with a provider, would they like to be referred to a provider to establish care? No .   Dental Screening: Recommended annual dental exams for proper oral hygiene  Nutrition Risk Assessment:  Has the patient had any N/V/D within the last 2 months?  No  Does the patient have any non-healing wounds?  No  Has the patient had any unintentional weight loss or weight gain?  No   Diabetes:  Is the patient diabetic?  Yes  If diabetic, was a CBG obtained today?  No  Did the patient bring in their glucometer from home?  No  How often do you monitor your CBG's? 1 x  Day.   Financial Strains and Diabetes Management:  Are you having any financial strains with the device, your supplies or your medication? No .  Does the patient want to be seen by Chronic Care Management for management of their diabetes?  No  Would the patient like to be referred to a Nutritionist or for Diabetic Management?  No   Diabetic Exams:  Diabetic Eye Exam: Completed  Pt has been advised about the importance in completing this exam  Diabetic Foot Exam: . Pt has been advised about the importance in completing this exam.  Community Resource Referral / Chronic Care Management: CRR required this visit?  No   CCM required this visit?  No     Plan:     I have personally reviewed and noted the following in the patient's chart:   Medical and social history Use of alcohol, tobacco or illicit drugs  Current medications and supplements including opioid prescriptions. Patient is currently taking opioid prescriptions. Information provided to patient regarding non-opioid alternatives. Patient advised to discuss non-opioid treatment plan with their provider. Functional ability and status Nutritional  status Physical activity Advanced directives List of other physicians Hospitalizations, surgeries, and ER visits in previous 12 months Vitals Screenings to include cognitive, depression, and falls Referrals and appointments  In addition, I have reviewed and discussed with patient certain preventive protocols, quality metrics, and best  practice recommendations. A written personalized care plan for preventive services as well as general preventive health recommendations were provided to patient.     Remi Haggard, LPN   [id number]/03/[id number]   After Visit Summary: (MyChart) Due to this being a telephonic visit, the after visit summary with patients personalized plan was offered to patient via MyChart   Nurse Notes:

## 2023-05-16 NOTE — Patient Instructions (Signed)
Ms. [name] , Thank you for taking time to come for your Medicare Wellness Visit. I appreciate your ongoing commitment to your health goals. Please review the following plan we discussed and let me know if I can assist you in the future.   Screening recommendations/referrals: Colonoscopy: no longer required Mammogram: no longer required Bone Density: up to date Recommended yearly ophthalmology/optometry visit for glaucoma screening and checkup Recommended yearly dental visit for hygiene and checkup  Vaccinations: Influenza vaccine: up to date Pneumococcal vaccine: up to date Tdap vaccine: Education provided Shingles vaccine: up to date    Advanced directives: up to date    Preventive Care 65 Years and Older, Female Preventive care refers to lifestyle choices and visits with your health care provider that can promote health and wellness. What does preventive care include? A yearly physical exam. This is also called an annual well check. Dental exams once or twice a year. Routine eye exams. Ask your health care provider how often you should have your eyes checked. Personal lifestyle choices, including: Daily care of your teeth and gums. Regular physical activity. Eating a healthy diet. Avoiding tobacco and drug use. Limiting alcohol use. Practicing safe sex. Taking low-dose aspirin every day. Taking vitamin and mineral supplements as recommended by your health care provider. What happens during an annual well check? The services and screenings done by your health care provider during your annual well check will depend on your age, overall health, lifestyle risk factors, and family history of disease. Counseling  Your health care provider may ask you questions about your: Alcohol use. Tobacco use. Drug use. Emotional well-being. Home and relationship well-being. Sexual activity. Eating habits. History of falls. Memory and ability to understand (cognition). Work and work  Astronomer. Reproductive health. Screening  You may have the following tests or measurements: Height, weight, and BMI. Blood pressure. Lipid and cholesterol levels. These may be checked every 5 years, or more frequently if you are over 41 years old. Skin check. Lung cancer screening. You may have this screening every year starting at age 15 if you have a 30-pack-year history of smoking and currently smoke or have quit within the past 15 years. Fecal occult blood test (FOBT) of the stool. You may have this test every year starting at age 73. Flexible sigmoidoscopy or colonoscopy. You may have a sigmoidoscopy every 5 years or a colonoscopy every 10 years starting at age 41. Hepatitis C blood test. Hepatitis B blood test. Sexually transmitted disease (STD) testing. Diabetes screening. This is done by checking your blood sugar (glucose) after you have not eaten for a while (fasting). You may have this done every 1-3 years. Bone density scan. This is done to screen for osteoporosis. You may have this done starting at age 32. Mammogram. This may be done every 1-2 years. Talk to your health care provider about how often you should have regular mammograms. Talk with your health care provider about your test results, treatment options, and if necessary, the need for more tests. Vaccines  Your health care provider may recommend certain vaccines, such as: Influenza vaccine. This is recommended every year. Tetanus, diphtheria, and acellular pertussis (Tdap, Td) vaccine. You may need a Td booster every 10 years. Zoster vaccine. You may need this after age 59. Pneumococcal 13-valent conjugate (PCV13) vaccine. One dose is recommended after age 48. Pneumococcal polysaccharide (PPSV23) vaccine. One dose is recommended after age 27. Talk to your health care provider about which screenings and vaccines you need and how  often you need them. This information is not intended to replace advice given to you by  your health care provider. Make sure you discuss any questions you have with your health care provider. Document Released: 07/10/2015 Document Revised: 03/02/2016 Document Reviewed: 04/14/2015 Elsevier Interactive Patient Education  2017 [employer].  Fall Prevention in the Home Falls can cause injuries. They can happen to people of all ages. There are many things you can do to make your home safe and to help prevent falls. What can I do on the outside of my home? Regularly fix the edges of walkways and driveways and fix any cracks. Remove anything that might make you trip as you walk through a door, such as a raised step or threshold. Trim any bushes or trees on the path to your home. Use bright outdoor lighting. Clear any walking paths of anything that might make someone trip, such as rocks or tools. Regularly check to see if handrails are loose or broken. Make sure that both sides of any steps have handrails. Any raised decks and porches should have guardrails on the edges. Have any leaves, snow, or ice cleared regularly. Use sand or salt on walking paths during winter. Clean up any spills in your garage right away. This includes oil or grease spills. What can I do in the bathroom? Use night lights. Install grab bars by the toilet and in the tub and shower. Do not use towel bars as grab bars. Use non-skid mats or decals in the tub or shower. If you need to sit down in the shower, use a plastic, non-slip stool. Keep the floor dry. Clean up any water that spills on the floor as soon as it happens. Remove soap buildup in the tub or shower regularly. Attach bath mats securely with double-sided non-slip rug tape. Do not have throw rugs and other things on the floor that can make you trip. What can I do in the bedroom? Use night lights. Make sure that you have a light by your bed that is easy to reach. Do not use any sheets or blankets that are too big for your bed. They should not hang  down onto the floor. Have a firm chair that has side arms. You can use this for support while you get dressed. Do not have throw rugs and other things on the floor that can make you trip. What can I do in the kitchen? Clean up any spills right away. Avoid walking on wet floors. Keep items that you use a lot in easy-to-reach places. If you need to reach something above you, use a strong step stool that has a grab bar. Keep electrical cords out of the way. Do not use floor polish or wax that makes floors slippery. If you must use wax, use non-skid floor wax. Do not have throw rugs and other things on the floor that can make you trip. What can I do with my stairs? Do not leave any items on the stairs. Make sure that there are handrails on both sides of the stairs and use them. Fix handrails that are broken or loose. Make sure that handrails are as long as the stairways. Check any carpeting to make sure that it is firmly attached to the stairs. Fix any carpet that is loose or worn. Avoid having throw rugs at the top or bottom of the stairs. If you do have throw rugs, attach them to the floor with carpet tape. Make sure that you have a  light switch at the top of the stairs and the bottom of the stairs. If you do not have them, ask someone to add them for you. What else can I do to help prevent falls? Wear shoes that: Do not have high heels. Have rubber bottoms. Are comfortable and fit you well. Are closed at the toe. Do not wear sandals. If you use a stepladder: Make sure that it is fully opened. Do not climb a closed stepladder. Make sure that both sides of the stepladder are locked into place. Ask someone to hold it for you, if possible. Clearly mark and make sure that you can see: Any grab bars or handrails. First and last steps. Where the edge of each step is. Use tools that help you move around (mobility aids) if they are needed. These  include: Canes. Walkers. Scooters. Crutches. Turn on the lights when you go into a dark area. Replace any light bulbs as soon as they burn out. Set up your furniture so you have a clear path. Avoid moving your furniture around. If any of your floors are uneven, fix them. If there are any pets around you, be aware of where they are. Review your medicines with your doctor. Some medicines can make you feel dizzy. This can increase your chance of falling. Ask your doctor what other things that you can do to help prevent falls. This information is not intended to replace advice given to you by your health care provider. Make sure you discuss any questions you have with your health care provider. Document Released: 04/09/2009 Document Revised: 11/19/2015 Document Reviewed: 07/18/2014 Elsevier Interactive Patient Education  2017 [employer].

## 2023-05-18 ENCOUNTER — Other Ambulatory Visit (HOSPITAL_COMMUNITY): Payer: Self-pay

## 2023-06-01 ENCOUNTER — Other Ambulatory Visit: Payer: Self-pay | Admitting: Internal Medicine

## 2023-06-02 ENCOUNTER — Other Ambulatory Visit: Payer: Self-pay | Admitting: Internal Medicine

## 2023-06-13 ENCOUNTER — Other Ambulatory Visit: Payer: Self-pay | Admitting: Internal Medicine

## 2023-07-20 ENCOUNTER — Other Ambulatory Visit: Payer: Self-pay | Admitting: Internal Medicine

## 2023-07-24 ENCOUNTER — Telehealth: Payer: Self-pay

## 2023-07-24 NOTE — Telephone Encounter (Signed)
Copied from CRM [id number]. Topic: Clinical - Medication Question >> Jul 24, 2023  2:27 PM Kathryne Eriksson wrote: Reason for CRM: lubiprostone ([name]) 24 MCG capsule , pregabalin (LYRICA) 100 MG capsule >> Jul 24, 2023  2:34 PM Kathryne Eriksson wrote: Patient states she was taken off of Ozempic and wanted to know if Pincus Sanes, MD was placing her back on it and if so when. Patient also has some concerns with the other 2 medications, lubiprostone ([name]) 24 MCG capsule , pregabalin (LYRICA) 100 MG capsule. Patient is requesting for someone to call her back at [phone]

## 2023-07-27 ENCOUNTER — Other Ambulatory Visit: Payer: Self-pay

## 2023-07-27 DIAGNOSIS — K5900 Constipation, unspecified: Secondary | ICD-10-CM

## 2023-07-27 MED ORDER — LUBIPROSTONE 24 MCG PO CAPS: ORAL_CAPSULE | ORAL | 1 refills | Status: DC

## 2023-07-28 ENCOUNTER — Other Ambulatory Visit: Payer: Self-pay

## 2023-07-28 MED ORDER — MECLIZINE HCL 12.5 MG PO TABS: 12.5000 mg | ORAL_TABLET | Freq: Three times a day (TID) | ORAL | 3 refills | Status: DC

## 2023-07-28 MED ORDER — CLONAZEPAM 0.5 MG PO TABS: ORAL_TABLET | ORAL | 3 refills | Status: DC

## 2023-07-28 MED ORDER — OZEMPIC (0.25 OR 0.5 MG/DOSE) 2 MG/1.5ML ~~LOC~~ SOPN: 0.2500 mg | PEN_INJECTOR | SUBCUTANEOUS | 0 refills | Status: DC

## 2023-07-28 NOTE — Addendum Note (Signed)
Addended by: Pincus Sanes on: 07/28/2023 12:10 PM   Modules accepted: Orders

## 2023-07-28 NOTE — Telephone Encounter (Signed)
We can restart the Ozempic as long as her constipation is controlled.  Since she has been off of it we should start at either the point to 5 or the 0.5-that she have a preference.?

## 2023-08-09 ENCOUNTER — Other Ambulatory Visit: Payer: Self-pay | Admitting: Internal Medicine

## 2023-08-09 ENCOUNTER — Telehealth: Payer: Self-pay | Admitting: Internal Medicine

## 2023-08-09 NOTE — Telephone Encounter (Signed)
Copied from CRM [id number]. Topic: Clinical - Prescription Issue >> Aug 09, 2023  2:47 PM Sim Boast F wrote: Reason for CRM: Patient requesting to speak to Dr. Lawerance Bach nurse, says she spoke to her a week and a half ago and still has not received medications - she forgot names of meds but one is for her anxiety and the other is for dizziness.  Says she needs them sent to [facility] in [place] but I only see location in [facility] and [place]?

## 2023-08-14 ENCOUNTER — Other Ambulatory Visit: Payer: Self-pay

## 2023-08-14 ENCOUNTER — Other Ambulatory Visit: Payer: Self-pay | Admitting: Internal Medicine

## 2023-08-14 DIAGNOSIS — K5900 Constipation, unspecified: Secondary | ICD-10-CM

## 2023-08-14 MED ORDER — PREGABALIN 100 MG PO CAPS: 100.0000 mg | ORAL_CAPSULE | Freq: Three times a day (TID) | ORAL | 0 refills | Status: DC

## 2023-08-14 MED ORDER — LUBIPROSTONE 24 MCG PO CAPS: ORAL_CAPSULE | ORAL | 1 refills | Status: DC

## 2023-08-14 MED ORDER — MECLIZINE HCL 12.5 MG PO TABS: 12.5000 mg | ORAL_TABLET | Freq: Three times a day (TID) | ORAL | 3 refills | Status: DC

## 2023-08-14 NOTE — Addendum Note (Signed)
 Addended by: Pincus Sanes on: 08/14/2023 08:11 PM   Modules accepted: Orders

## 2023-08-14 NOTE — Telephone Encounter (Signed)
 Spoke with patient today.  Scripts were faxed the end of January and fax conformation received.  Refaxed again today with patient on the phone.

## 2023-08-16 ENCOUNTER — Other Ambulatory Visit: Payer: PPO

## 2023-08-18 ENCOUNTER — Telehealth: Payer: Self-pay

## 2023-08-18 ENCOUNTER — Telehealth: Payer: Self-pay | Admitting: Internal Medicine

## 2023-08-18 NOTE — Telephone Encounter (Signed)
 Copied from CRM [id number]. Topic: General - Other >> Aug 18, 2023  9:55 AM [place] J wrote:  Reason for CRM: Patient needs to speak with Dr. Juanell Fairly medical assistant. Patient didn't specify reasoning but would like a call back at earliest convenience.

## 2023-08-18 NOTE — Telephone Encounter (Signed)
 Pt is requesting a call back from [place]. Pt would not give any additional info. Please call Carla: [phone]

## 2023-08-18 NOTE — Telephone Encounter (Signed)
 Spoke with patient today concerning mail order script for her Lubiprostone.  Contacted [facility] and spoke with [facility] today.  He verified fax number was correct and that they would contact patient to get shipment set up for her 180 day supply.

## 2023-11-05 ENCOUNTER — Other Ambulatory Visit: Payer: Self-pay | Admitting: Internal Medicine

## 2023-11-07 ENCOUNTER — Encounter: Payer: Self-pay | Admitting: Internal Medicine

## 2023-11-07 NOTE — Patient Instructions (Addendum)
 Blood work was ordered.       Medications changes include :   start taking a stool softener daily to prevent constipation.  Start eating prunes or drink the prune juice.    Clobetasol steroid cream - twice daily as needed for itching of vulva     Return in about 6 months (around 05/10/2024) for follow up.    Health Maintenance, Female Adopting a healthy lifestyle and getting preventive care are important in promoting health and wellness. Ask your health care provider about: The right schedule for you to have regular tests and exams. Things you can do on your own to prevent diseases and keep yourself healthy. What should I know about diet, weight, and exercise? Eat a healthy diet  Eat a diet that includes plenty of vegetables, fruits, low-fat dairy products, and lean protein. Do not eat a lot of foods that are high in solid fats, added sugars, or sodium. Maintain a healthy weight Body mass index (BMI) is used to identify weight problems. It estimates body fat based on height and weight. Your health care provider can help determine your BMI and help you achieve or maintain a healthy weight. Get regular exercise Get regular exercise. This is one of the most important things you can do for your health. Most adults should: Exercise for at least 150 minutes each week. The exercise should increase your heart rate and make you sweat (moderate-intensity exercise). Do strengthening exercises at least twice a week. This is in addition to the moderate-intensity exercise. Spend less time sitting. Even light physical activity can be beneficial. Watch cholesterol and blood lipids Have your blood tested for lipids and cholesterol at 81 years of age, then have this test every 5 years. Have your cholesterol levels checked more often if: Your lipid or cholesterol levels are high. You are older than 81 years of age. You are at high risk for heart disease. What should I know about cancer  screening? Depending on your health history and family history, you may need to have cancer screening at various ages. This may include screening for: Breast cancer. Cervical cancer. Colorectal cancer. Skin cancer. Lung cancer. What should I know about heart disease, diabetes, and high blood pressure? Blood pressure and heart disease High blood pressure causes heart disease and increases the risk of stroke. This is more likely to develop in people who have high blood pressure readings or are overweight. Have your blood pressure checked: Every 3-5 years if you are 62-71 years of age. Every year if you are 19 years old or older. Diabetes Have regular diabetes screenings. This checks your fasting blood sugar level. Have the screening done: Once every three years after age 9 if you are at a normal weight and have a low risk for diabetes. More often and at a younger age if you are overweight or have a high risk for diabetes. What should I know about preventing infection? Hepatitis B If you have a higher risk for hepatitis B, you should be screened for this virus. Talk with your health care provider to find out if you are at risk for hepatitis B infection. Hepatitis C Testing is recommended for: Everyone born from [birth year] through 1963. Anyone with known risk factors for hepatitis C. Sexually transmitted infections (STIs) Get screened for STIs, including gonorrhea and chlamydia, if: You are sexually active and are younger than 81 years of age. You are older than 81 years of age and your health care  provider tells you that you are at risk for this type of infection. Your sexual activity has changed since you were last screened, and you are at increased risk for chlamydia or gonorrhea. Ask your health care provider if you are at risk. Ask your health care provider about whether you are at high risk for HIV. Your health care provider may recommend a prescription medicine to help prevent HIV  infection. If you choose to take medicine to prevent HIV, you should first get tested for HIV. You should then be tested every 3 months for as long as you are taking the medicine. Pregnancy If you are about to stop having your period (premenopausal) and you may become pregnant, seek counseling before you get pregnant. Take 400 to 800 micrograms (mcg) of folic acid every day if you become pregnant. Ask for birth control (contraception) if you want to prevent pregnancy. Osteoporosis and menopause Osteoporosis is a disease in which the bones lose minerals and strength with aging. This can result in bone fractures. If you are 23 years old or older, or if you are at risk for osteoporosis and fractures, ask your health care provider if you should: Be screened for bone loss. Take a calcium  or vitamin D supplement to lower your risk of fractures. Be given hormone replacement therapy (HRT) to treat symptoms of menopause. Follow these instructions at home: Alcohol  use Do not drink alcohol  if: Your health care provider tells you not to drink. You are pregnant, may be pregnant, or are planning to become pregnant. If you drink alcohol : Limit how much you have to: 0-1 drink a day. Know how much alcohol  is in your drink. In the [place] one drink equals one 12 oz bottle of beer (355 mL), one 5 oz glass of wine (148 mL), or one 1 oz glass of hard liquor (44 mL). Lifestyle Do not use any products that contain nicotine or tobacco. These products include cigarettes, chewing tobacco, and vaping devices, such as e-cigarettes. If you need help quitting, ask your health care provider. Do not use street drugs. Do not share needles. Ask your health care provider for help if you need support or information about quitting drugs. General instructions Schedule regular health, dental, and eye exams. Stay current with your vaccines. Tell your health care provider if: You often feel depressed. You have ever been abused  or do not feel safe at home. Summary Adopting a healthy lifestyle and getting preventive care are important in promoting health and wellness. Follow your health care provider's instructions about healthy diet, exercising, and getting tested or screened for diseases. Follow your health care provider's instructions on monitoring your cholesterol and blood pressure. This information is not intended to replace advice given to you by your health care provider. Make sure you discuss any questions you have with your health care provider. Document Revised: 11/02/2020 Document Reviewed: 11/02/2020 Elsevier Patient Education  2024 [employer].

## 2023-11-07 NOTE — Progress Notes (Unsigned)
 Subjective:    Patient ID: [name], female    DOB: 1940-09-15, 83 y.o.   MRN: [id number]      HPI [name] is here for a Physical exam and her chronic medical problems.   Her body hurts all over - she takes Tylenol  as needed.  She knows she has arthritis all over the place.  She walks as much as she can outside or inside the house.    She still has constipation.  Medications and allergies reviewed with patient and updated if appropriate.  Current Outpatient Medications on File Prior to Visit  Medication Sig Dispense Refill   acetaminophen  (TYLENOL ) 500 MG tablet Take 500 mg by mouth every 6 (six) hours as needed for headache (pain).     albuterol  (VENTOLIN  HFA) 108 (90 Base) MCG/ACT inhaler Inhale 2 puffs into the lungs every 6 (six) hours as needed for wheezing or shortness of breath.     aspirin  EC 81 MG tablet Take 1 tablet (81 mg total) by mouth daily. 90 tablet 3   clonazePAM  (KLONOPIN ) 0.5 MG tablet TAKE 1/2 TO 1 TABLET BY MOUTH TWICE A DAY AS NEEDED FOR ANXIETY 60 tablet 4   dicyclomine  (BENTYL ) 10 MG capsule TAKE 1 CAPSULE BY MOUTH 3 TIMES DAILY BEFORE MEALS *PLEASE SCHEDULE APPOINTMENT FOR FURTHER REFILLS* *REFILL REQUEST* (Patient taking differently: Take 10 mg by mouth 2 (two) times daily before a meal.) 90 capsule 10   docusate sodium  (COLACE) 100 MG capsule Take 3 capsules (300 mg total) by mouth daily. (Patient taking differently: Take 100 mg by mouth as needed for mild constipation.) 90 capsule 11   ezetimibe  (ZETIA ) 10 MG tablet TAKE 1 TABLET BY MOUTH ONCE DAILY 30 tablet 10   famotidine  (PEPCID ) 40 MG tablet TAKE 1 TABLET BY MOUTH ONCE DAILY 30 tablet 10   fluticasone  (FLONASE ) 50 MCG/ACT nasal spray Place 2 sprays into both nostrils 2 (two) times daily as needed for allergies or rhinitis.     meclizine  (ANTIVERT ) 12.5 MG tablet Take 1 tablet (12.5 mg total) by mouth 3 (three) times daily. 30 tablet 3   Multiple Vitamin (MULTIVITAMIN WITH MINERALS) TABS  tablet Take 1 tablet by mouth daily after lunch.     nitroGLYCERIN  (NITROSTAT ) 0.4 MG SL tablet DISSOLVE 1 TABLET UNDER THE TONGUE AS NEEDED FOR CHEST PAIN EVERY 5 MINUTES UP TO 3 TIMES. IF NO RELIEF CALL 911. *REFILL REQUEST* (Patient taking differently: Place 0.4 mg under the tongue every 5 (five) minutes as needed for chest pain (Up to 3 times). Keep on hand for when needed) 25 tablet 10   omeprazole  (PRILOSEC) 40 MG capsule TAKE 1 CAPSULE BY MOUTH ONCE DAILY 30 capsule 10   OVER THE COUNTER MEDICATION Place 1 drop into both eyes 2 (two) times daily as needed (dry eyes/ pataday). Over the counter eye drop for itching     OXcarbazepine  (TRILEPTAL ) 150 MG tablet TAKE 1 TABLET BY MOUTH TWICE DAILY 60 tablet 10   OZEMPIC , 0.25 OR 0.5 MG/DOSE, 2 MG/3ML SOPN INJECT 0.25 MG SUBCUTANEOUSLY ONCE A WEEK. 3 mL 10   potassium chloride  SA (KLOR-CON  M) 20 MEQ tablet TAKE 1 TABLET BY MOUTH 3 TIMES DAILY 90 tablet 10   pregabalin  (LYRICA ) 100 MG capsule Take 1 capsule (100 mg total) by mouth 3 (three) times daily. 270 capsule 0   Probiotic Product (PROBIOTIC PO) Take 1 capsule by mouth daily after breakfast.     rosuvastatin  (CRESTOR ) 5 MG tablet TAKE ONE TABLET  BY MOUTH EVERYDAY AT BEDTIME (Patient taking differently: Take 5 mg by mouth at bedtime.) 90 tablet 4   [DISCONTINUED] Calcium  Carbonate (CALCIUM  500 PO) Take 1 capsule by mouth every other day.      No current facility-administered medications on file prior to visit.    Review of Systems  Constitutional:  Negative for fever.  Eyes:  Negative for visual disturbance.  Respiratory:  Positive for cough (a little recently - allergies) and shortness of breath (a little at times). Negative for wheezing.   Cardiovascular:  Positive for chest pain (sometimes) and palpitations (for the past month). Negative for leg swelling.  Gastrointestinal:  Positive for constipation. Negative for abdominal pain, blood in stool and diarrhea.       Gerd controlled   Genitourinary:  Positive for vaginal discharge (a little - yellow). Negative for vaginal bleeding.       Vulvar itch  Musculoskeletal:  Positive for arthralgias and back pain.  Skin:  Negative for rash.  Neurological:  Positive for headaches. Negative for light-headedness.  Psychiatric/Behavioral:  Negative for dysphoric mood. The patient is not nervous/anxious.        Objective:   Vitals:   11/08/23 1410  BP: 124/76  Pulse: 67  Temp: 98.1 F (36.7 C)  SpO2: 97%   Filed Weights   11/08/23 1410  Weight: 161 lb (73 kg)   Body mass index is 29.45 kg/m.  BP Readings from Last 3 Encounters:  11/08/23 124/76  05/08/23 118/74  05/04/23 128/70    Wt Readings from Last 3 Encounters:  11/08/23 161 lb (73 kg)  05/08/23 151 lb (68.5 kg)  05/04/23 152 lb 8.9 oz (69.2 kg)       Physical Exam Constitutional: She appears well-developed and well-nourished. No distress.  HENT:  Head: Normocephalic and atraumatic.  Right Ear: External ear normal. Normal ear canal and TM Left Ear: External ear normal.  Normal ear canal and TM Mouth/Throat: Oropharynx is clear and moist.  Eyes: Conjunctivae normal.  Neck: Neck supple. No tracheal deviation present. No thyromegaly present.  No carotid bruit  Cardiovascular: Normal rate, regular rhythm and normal heart sounds.   No murmur heard.  No edema. Pulmonary/Chest: Effort normal and breath sounds normal. No respiratory distress. She has no wheezes. She has no rales.  Breast: deferred   Abdominal: Soft. She exhibits no distension. There is no tenderness.  Lymphadenopathy: She has no cervical adenopathy.  Skin: Skin is warm and dry. She is not diaphoretic.  Psychiatric: She has a normal mood and affect. Her behavior is normal.   Diabetic Foot Exam - Simple   Simple Foot Form Visual Inspection No deformities, no ulcerations, no other skin breakdown bilaterally: Yes Sensation Testing See comments: Yes Pulse Check Posterior Tibialis  and Dorsalis pulse intact bilaterally: Yes Comments Significant decreased sensation to light touch      Lab Results  Component Value Date   WBC 4.7 05/04/2023   HGB 12.1 05/04/2023   HCT 36.7 05/04/2023   PLT 238 05/04/2023   GLUCOSE 104 (H) 05/04/2023   CHOL 231 (H) 11/02/2022   TRIG 138.0 11/02/2022   HDL 68.00 11/02/2022   LDLDIRECT 113.0 07/21/2017   LDLCALC 136 (H) 11/02/2022   ALT 21 05/01/2023   AST 29 05/01/2023   NA 137 05/04/2023   K 3.6 05/04/2023   CL 101 05/04/2023   CREATININE 0.67 05/04/2023   BUN 9 05/04/2023   CO2 26 05/04/2023   TSH 1.39 02/06/2019   INR 1.00  01/03/2017   HGBA1C 6.1 (A) 05/08/2023   HGBA1C 6.1 05/08/2023   HGBA1C 6.1 05/08/2023   HGBA1C 6.1 05/08/2023   MICROALBUR 7.5 (H) 11/02/2022         Assessment & Plan:   Physical exam: Screening blood work  ordered Exercise  walking as much as she can Weight  ok for age Substance abuse  none   Reviewed recommended immunizations.   Health Maintenance  Topic Date Due   DEXA SCAN  01/17/2021   FOOT EXAM  04/26/2023   Diabetic kidney evaluation - Urine ACR  11/02/2023   HEMOGLOBIN A1C  11/05/2023   COVID-19 Vaccine (6 - 2024-25 season) 11/23/2023 (Originally 02/26/2023)   DTaP/Tdap/Td (1 - Tdap) 05/15/2024 (Originally 05/03/2012)   INFLUENZA VACCINE  01/26/2024   OPHTHALMOLOGY EXAM  04/19/2024   Diabetic kidney evaluation - eGFR measurement  05/03/2024   Medicare Annual Wellness (AWV)  05/15/2024   Pneumonia Vaccine 57+ Years old  Completed   Zoster Vaccines- Shingrix  Completed   HPV VACCINES  Aged Out   Meningococcal B Vaccine  Aged Out   Colonoscopy  Discontinued   Hepatitis C Screening  Discontinued          See Problem List for Assessment and Plan of chronic medical problems.

## 2023-11-08 ENCOUNTER — Ambulatory Visit (INDEPENDENT_AMBULATORY_CARE_PROVIDER_SITE_OTHER): Payer: PPO | Admitting: Internal Medicine

## 2023-11-08 VITALS — BP 124/76 | HR 67 | Temp 98.1°F | Ht 62.0 in | Wt 161.0 lb

## 2023-11-08 DIAGNOSIS — G40909 Epilepsy, unspecified, not intractable, without status epilepticus: Secondary | ICD-10-CM | POA: Diagnosis not present

## 2023-11-08 DIAGNOSIS — E1142 Type 2 diabetes mellitus with diabetic polyneuropathy: Secondary | ICD-10-CM

## 2023-11-08 DIAGNOSIS — I2510 Atherosclerotic heart disease of native coronary artery without angina pectoris: Secondary | ICD-10-CM

## 2023-11-08 DIAGNOSIS — Z0000 Encounter for general adult medical examination without abnormal findings: Secondary | ICD-10-CM

## 2023-11-08 DIAGNOSIS — E2839 Other primary ovarian failure: Secondary | ICD-10-CM | POA: Diagnosis not present

## 2023-11-08 DIAGNOSIS — E785 Hyperlipidemia, unspecified: Secondary | ICD-10-CM

## 2023-11-08 DIAGNOSIS — Z13820 Encounter for screening for osteoporosis: Secondary | ICD-10-CM

## 2023-11-08 DIAGNOSIS — F418 Other specified anxiety disorders: Secondary | ICD-10-CM

## 2023-11-08 DIAGNOSIS — E876 Hypokalemia: Secondary | ICD-10-CM | POA: Diagnosis not present

## 2023-11-08 DIAGNOSIS — N904 Leukoplakia of vulva: Secondary | ICD-10-CM

## 2023-11-08 DIAGNOSIS — K5900 Constipation, unspecified: Secondary | ICD-10-CM

## 2023-11-08 DIAGNOSIS — K219 Gastro-esophageal reflux disease without esophagitis: Secondary | ICD-10-CM | POA: Diagnosis not present

## 2023-11-08 LAB — CBC WITH DIFFERENTIAL/PLATELET
Basophils Absolute: 0 10*3/uL (ref 0.0–0.1)
Basophils Relative: 0.3 % (ref 0.0–3.0)
Eosinophils Absolute: 0.1 10*3/uL (ref 0.0–0.7)
Eosinophils Relative: 0.9 % (ref 0.0–5.0)
HCT: 39 % (ref 36.0–46.0)
Hemoglobin: 13.1 g/dL (ref 12.0–15.0)
Lymphocytes Relative: 48 % — ABNORMAL HIGH (ref 12.0–46.0)
Lymphs Abs: 3.3 10*3/uL (ref 0.7–4.0)
MCHC: 33.5 g/dL (ref 30.0–36.0)
MCV: 90.2 fl (ref 78.0–100.0)
Monocytes Absolute: 0.7 10*3/uL (ref 0.1–1.0)
Monocytes Relative: 10.5 % (ref 3.0–12.0)
Neutro Abs: 2.7 10*3/uL (ref 1.4–7.7)
Neutrophils Relative %: 40.3 % — ABNORMAL LOW (ref 43.0–77.0)
Platelets: 219 10*3/uL (ref 150.0–400.0)
RBC: 4.33 Mil/uL (ref 3.87–5.11)
RDW: 14.1 % (ref 11.5–15.5)
WBC: 6.8 10*3/uL (ref 4.0–10.5)

## 2023-11-08 LAB — LIPID PANEL
Cholesterol: 197 mg/dL (ref 0–200)
HDL: 67.4 mg/dL (ref >39.00)
LDL Cholesterol: 102 mg/dL — ABNORMAL HIGH (ref 0–99)
NonHDL: 129.31
Total CHOL/HDL Ratio: 3
Triglycerides: 135 mg/dL (ref 0.0–149.0)
VLDL: 27 mg/dL (ref 0.0–40.0)

## 2023-11-08 LAB — COMPREHENSIVE METABOLIC PANEL WITH GFR
ALT: 17 U/L (ref 0–35)
AST: 26 U/L (ref 0–37)
Albumin: 4.4 g/dL (ref 3.5–5.2)
Alkaline Phosphatase: 75 U/L (ref 39–117)
BUN: 12 mg/dL (ref 6–23)
CO2: 30 meq/L (ref 19–32)
Calcium: 10.2 mg/dL (ref 8.4–10.5)
Chloride: 99 meq/L (ref 96–112)
Creatinine, Ser: 0.72 mg/dL (ref 0.40–1.20)
GFR: 78.71 mL/min (ref >60.00)
Glucose, Bld: 51 mg/dL — ABNORMAL LOW (ref 70–99)
Potassium: 3.4 meq/L — ABNORMAL LOW (ref 3.5–5.1)
Sodium: 138 meq/L (ref 135–145)
Total Bilirubin: 0.3 mg/dL (ref 0.2–1.2)
Total Protein: 8 g/dL (ref 6.0–8.3)

## 2023-11-08 LAB — MICROALBUMIN / CREATININE URINE RATIO
Creatinine,U: 67.8 mg/dL
Microalb Creat Ratio: 44.1 mg/g — ABNORMAL HIGH (ref 0.0–30.0)
Microalb, Ur: 3 mg/dL — ABNORMAL HIGH (ref 0.0–1.9)

## 2023-11-08 LAB — TSH: TSH: 3.07 u[IU]/mL (ref 0.35–5.50)

## 2023-11-08 LAB — HEMOGLOBIN A1C: Hgb A1c MFr Bld: 7.2 % — ABNORMAL HIGH (ref 4.6–6.5)

## 2023-11-08 MED ORDER — CLOBETASOL PROPIONATE 0.05 % EX CREA: 1.0000 | TOPICAL_CREAM | Freq: Two times a day (BID) | CUTANEOUS | 2 refills | Status: DC | PRN

## 2023-11-08 NOTE — Assessment & Plan Note (Signed)
Chronic CMP Continue potassium 20 mEq 3 times a day

## 2023-11-08 NOTE — Assessment & Plan Note (Signed)
 Chronic Not currently taking anything for her constipation-takes a gentle laxative as needed Stressed that she takes a stool softener daily and advised that she start drinking prune juice or eating prunes on a daily basis which she does like Encouraged her to prevent the constipation and in the way this is natural as possible

## 2023-11-08 NOTE — Assessment & Plan Note (Signed)
Chronic GERD controlled Continue omeprazole 40 mg daily, pepcid 40 mg daily  

## 2023-11-08 NOTE — Assessment & Plan Note (Signed)
Chronic Following with cardiology Having some chest pain - has discussed with cardiology Continue aspirin 81 mg daily, Crestor 5 mg daily, Zetia 10 mg daily

## 2023-11-08 NOTE — Assessment & Plan Note (Addendum)
 Chronic Has significant decrease sensation on exam Has not tolerated gabapentin , nortriptyline , Cymbalta  and Effexor  in the past Continue Lyrica  100 mg 3 times daily

## 2023-11-08 NOTE — Assessment & Plan Note (Signed)
 Chronic A1c today-  Lab Results  Component Value Date   HGBA1C 6.1 (A) 05/08/2023   HGBA1C 6.1 05/08/2023   HGBA1C 6.1 05/08/2023   HGBA1C 6.1 05/08/2023   Sugars controlled Check A1c, urine microalbumin Continue  Ozempic  0.25 mg weekly

## 2023-11-08 NOTE — Assessment & Plan Note (Signed)
Chronic Controlled, Stable Continue clonazepam 0.25-0.5 mg twice daily as needed - does not take often

## 2023-11-08 NOTE — Assessment & Plan Note (Addendum)
 Presumed Itching on vulva, which she has thought was a yeast infection-Diflucan  has helped, but she keeps getting the symptoms again Deferred GYN evaluation Likely lichen sclerosis Start clobetasol cream daily as needed

## 2023-11-08 NOTE — Assessment & Plan Note (Signed)
 Chronic Regular exercise and healthy diet encouraged Check lipid panel, CMP, TSH Continue Zetia  10 mg daily, Crestor  5 mg daily

## 2023-11-08 NOTE — Assessment & Plan Note (Signed)
 Chronic Denies any seizure-like activity Controlled Continue oxycarbamazepine 150 mg twice daily Has not seen neurology in a while-advised neurology follow-up

## 2023-11-09 ENCOUNTER — Ambulatory Visit: Payer: Self-pay | Admitting: Internal Medicine

## 2023-11-10 ENCOUNTER — Other Ambulatory Visit: Payer: Self-pay

## 2023-11-17 ENCOUNTER — Other Ambulatory Visit: Payer: Self-pay

## 2023-11-17 MED ORDER — ROSUVASTATIN CALCIUM 5 MG PO TABS
5.0000 mg | ORAL_TABLET | Freq: Every evening | ORAL | 3 refills | Status: DC
  Filled 2024-05-16 (×2): qty 30, 30d supply, fill #0

## 2023-11-17 NOTE — Telephone Encounter (Signed)
 Copied from CRM [id number]. Topic: General - Other >> Nov 17, 2023 12:14 PM [place] A wrote: Reason for CRM: Patient was returning call from Nurse-

## 2023-11-21 ENCOUNTER — Ambulatory Visit (INDEPENDENT_AMBULATORY_CARE_PROVIDER_SITE_OTHER)
Admission: RE | Admit: 2023-11-21 | Discharge: 2023-11-21 | Disposition: A | Discharge status: Home / Self Care | Source: Ambulatory Visit | Attending: Internal Medicine

## 2023-11-21 DIAGNOSIS — E2839 Other primary ovarian failure: Secondary | ICD-10-CM

## 2023-11-21 DIAGNOSIS — Z13820 Encounter for screening for osteoporosis: Secondary | ICD-10-CM | POA: Diagnosis not present

## 2023-11-23 ENCOUNTER — Encounter: Payer: Self-pay | Admitting: Internal Medicine

## 2023-11-23 DIAGNOSIS — M810 Age-related osteoporosis without current pathological fracture: Secondary | ICD-10-CM | POA: Insufficient documentation

## 2023-11-30 MED ORDER — OZEMPIC (0.25 OR 0.5 MG/DOSE) 2 MG/3ML ~~LOC~~ SOPN
0.5000 mg | PEN_INJECTOR | SUBCUTANEOUS | 1 refills | Status: DC
Start: 2023-11-30 — End: 2024-05-02

## 2023-12-11 ENCOUNTER — Other Ambulatory Visit: Payer: Self-pay | Admitting: Internal Medicine

## 2023-12-16 LAB — LAB REPORT - SCANNED
Creatinine, POC: 0.66 mg/dL
EGFR: 88

## 2024-01-03 ENCOUNTER — Other Ambulatory Visit: Payer: Self-pay | Admitting: Internal Medicine

## 2024-01-10 ENCOUNTER — Ambulatory Visit: Payer: Self-pay

## 2024-01-10 NOTE — Telephone Encounter (Signed)
 FYI Only or Action Required?: FYI only for provider.  Patient was last seen in primary care on 11/08/2023 by Geofm Glade PARAS, MD.  Called Nurse Triage reporting Leg Pain.  Symptoms began several weeks ago.  Interventions attempted: Nothing.  Symptoms are: unchanged.  Triage Disposition: See PCP When Office is Open (Within 3 Days)  Patient/caregiver understands and will follow disposition?: Yes, will follow disposition  Copied from CRM [id number]. Topic: Clinical - Red Word Triage >> Jan 10, 2024  3:07 PM Gennette ORN wrote: Red Word that prompted transfer to Nurse Triage: Patient is having pain left side waist down her daughter thinks it's arthritis , she is in severe pain. Reason for Disposition  [1] MODERATE pain (e.g., interferes with normal activities, limping) AND [2] present > 3 days  Answer Assessment - Initial Assessment Questions 1. ONSET: When did the pain start?      L side pain waist down 2. LOCATION: Where is the pain located?      L side 3. PAIN: How bad is the pain?    (Scale 1-10; or mild, moderate, severe)     Moderate-severe 4. WORK OR EXERCISE: Has there been any recent work or exercise that involved this part of the body?     Hx of arthritis 5. CAUSE: What do you think is causing the leg pain?     arthritis 6. OTHER SYMPTOMS: Do you have any other symptoms? (e.g., chest pain, back pain, breathing difficulty, swelling, rash, fever, numbness, weakness)     denies  Protocols used: Leg Pain-A-AH

## 2024-01-11 ENCOUNTER — Ambulatory Visit (INDEPENDENT_AMBULATORY_CARE_PROVIDER_SITE_OTHER)

## 2024-01-11 ENCOUNTER — Other Ambulatory Visit (HOSPITAL_COMMUNITY): Payer: Self-pay

## 2024-01-11 ENCOUNTER — Encounter: Payer: Self-pay | Admitting: Internal Medicine

## 2024-01-11 ENCOUNTER — Ambulatory Visit: Admitting: Internal Medicine

## 2024-01-11 VITALS — BP 116/72 | HR 53 | Ht 62.0 in | Wt 160.6 lb

## 2024-01-11 DIAGNOSIS — M5442 Lumbago with sciatica, left side: Secondary | ICD-10-CM | POA: Diagnosis not present

## 2024-01-11 DIAGNOSIS — M5126 Other intervertebral disc displacement, lumbar region: Secondary | ICD-10-CM | POA: Diagnosis not present

## 2024-01-11 DIAGNOSIS — M47816 Spondylosis without myelopathy or radiculopathy, lumbar region: Secondary | ICD-10-CM | POA: Diagnosis not present

## 2024-01-11 DIAGNOSIS — I700 Atherosclerosis of aorta: Secondary | ICD-10-CM | POA: Diagnosis not present

## 2024-01-11 DIAGNOSIS — M810 Age-related osteoporosis without current pathological fracture: Secondary | ICD-10-CM

## 2024-01-11 DIAGNOSIS — I2583 Coronary atherosclerosis due to lipid rich plaque: Secondary | ICD-10-CM | POA: Diagnosis not present

## 2024-01-11 DIAGNOSIS — E1142 Type 2 diabetes mellitus with diabetic polyneuropathy: Secondary | ICD-10-CM

## 2024-01-11 DIAGNOSIS — Z7985 Long-term (current) use of injectable non-insulin antidiabetic drugs: Secondary | ICD-10-CM

## 2024-01-11 DIAGNOSIS — I2510 Atherosclerotic heart disease of native coronary artery without angina pectoris: Secondary | ICD-10-CM | POA: Diagnosis not present

## 2024-01-11 DIAGNOSIS — Z043 Encounter for examination and observation following other accident: Secondary | ICD-10-CM | POA: Diagnosis not present

## 2024-01-11 MED ORDER — CYCLOBENZAPRINE HCL 5 MG PO TABS
5.0000 mg | ORAL_TABLET | Freq: Three times a day (TID) | ORAL | 1 refills | Status: AC | PRN
  Filled 2024-01-11: qty 40, 14d supply, fill #0

## 2024-01-11 MED ORDER — PREDNISONE 10 MG PO TABS
ORAL_TABLET | ORAL | 0 refills | Status: AC
  Filled 2024-01-11: qty 18, 9d supply, fill #0

## 2024-01-11 MED ORDER — TRAMADOL HCL 50 MG PO TABS
50.0000 mg | ORAL_TABLET | Freq: Four times a day (QID) | ORAL | 0 refills | Status: AC | PRN
  Filled 2024-01-11: qty 30, 8d supply, fill #0

## 2024-01-11 MED ORDER — ALENDRONATE SODIUM 70 MG PO TABS
70.0000 mg | ORAL_TABLET | ORAL | 3 refills | Status: DC
  Filled 2024-01-11: qty 12, 84d supply, fill #0

## 2024-01-11 NOTE — Assessment & Plan Note (Signed)
 Pt needs cardiology re establishment, will refer,, cont same tx

## 2024-01-11 NOTE — Assessment & Plan Note (Signed)
 Severe by recent dxa - pt willing for trial fosamax  70 mg weekly

## 2024-01-11 NOTE — Patient Instructions (Addendum)
 Please take all new medication as prescribed- the tramadol  as needed for pain, the muscle relaxer as needed, as well as prednisone  as directed  Please take all new medication as prescribed - the generic for fosamax  70 mg once weekly  Please continue all other medications as before, and refills have been done if requested.  Please have the pharmacy call with any other refills you may need.  Please keep your appointments with your specialists as you may have planned  You will be contacted regarding the referral for: MRI Lumbar spine, and Cardiology  Please go to the XRAY Department in the first floor for the x-ray testing  You will be contacted by phone if any changes need to be made immediately.  Otherwise, you will receive a letter about your results with an explanation, but please check with MyChart first.  Please be sure to follow up with Dr Geofm in August as you mentioned

## 2024-01-11 NOTE — Progress Notes (Signed)
 Patient ID: [name], female   DOB: 11/01/1940, 83 y.o.   MRN: [id number]        Chief Complaint: follow up right low back pain, osteoporosis, CAD, dm       HPI:  [name] is a 83 y.o. female here with daughter with c/o new 6 wks onset left lower back pain with radiation to the left leg below the knee [place] to right hip about 1 mo ago but no other falls or trauma.  Has hx of osteoporosis severe by DXA may 2025 but not discussed about treatment it seems.  Also prior cardiology Dr Claudene has retired, pt has hx of CAD and will need further cardiology establishment.  Pt denies chest pain, increased sob or doe, wheezing, orthopnea, PND, increased LE swelling, palpitations, dizziness or syncope.   Pt denies polydipsia, polyuria, or new focal neuro s/s.         Wt Readings from Last 3 Encounters:  01/11/24 160 lb 9.6 oz (72.8 kg)  11/08/23 161 lb (73 kg)  05/08/23 151 lb (68.5 kg)   BP Readings from Last 3 Encounters:  01/11/24 116/72  11/08/23 124/76  05/08/23 118/74         Past Medical History:  Diagnosis Date   Abdominal distension, gaseous 04/10/2017   Allergic rhinitis 03/13/2013   CAD (coronary artery disease) 05/28/2018   Cerebrovascular disease    Chest pain 10/12/2016   Degeneration of lumbar intervertebral disc 08/02/2017   Diabetic neuropathy 05/12/2014   Did not tolerated cymbalta  - hives, itching Tried nortriptyline  - not effective Did not tolerate Effexor -nausea gabapentin -not effective    Diverticulitis of large intestine 05/12/2014   Dyslipidemia    Gastroesophageal reflux disease 03/18/2009   Hearing loss 02/01/2017   Hypercalcemia 10/12/2016   Hypertension    Hypokalemia 01/06/2020   Insomnia 03/20/2009   Irritable bowel syndrome 03/20/2009   With chronic constipation   Laryngopharyngeal reflux (LPR) 09/27/2016   Lumbar pain 08/23/2016   Mild neurocognitive disorder due to multiple etiologies 11/06/2020   Myofascial pain  05/19/2015   Neck pain 09/27/2016   Non-toxic multinodular goiter 05/18/2009   Obstructive sleep apnea 03/18/2009   Not using CPAP   Osteoarthritis of left knee 01/12/2017   Osteoarthritis of shoulder region    Pain in joint of right shoulder 01/21/2019   Presbycusis of both ears 09/27/2016   Seizure disorder    Situational anxiety    Spondylolisthesis 09/20/2017   Steatosis of liver 05/12/2014   Type 2 diabetes mellitus 05/12/2014   Unstable angina 01/05/2020   Vertigo 04/13/2009   Past Surgical History:  Procedure Laterality Date   ABDOMINAL HYSTERECTOMY  1970's   Partial   APPENDECTOMY     CHOLECYSTECTOMY     LEFT HEART CATH AND CORONARY ANGIOGRAPHY N/A 05/28/2018   Procedure: LEFT HEART CATH AND CORONARY ANGIOGRAPHY;  Surgeon: Claudene Victory ORN, MD;  Location: [facility];  Service: Cardiovascular;  Laterality: N/A;   LEFT HEART CATH AND CORONARY ANGIOGRAPHY N/A 01/06/2020   Procedure: LEFT HEART CATH AND CORONARY ANGIOGRAPHY;  Surgeon: Claudene Victory ORN, MD;  Location: [facility];  Service: Cardiovascular;  Laterality: N/A;   LUMBAR EPIDURAL INJECTION Left 08/25/2017   SHOULDER SURGERY  2008   LT, post fall    TONSILLECTOMY AND ADENOIDECTOMY     TOTAL KNEE ARTHROPLASTY Left 01/12/2017   Procedure: LEFT TOTAL KNEE ARTHROPLASTY;  Surgeon: Duwayne Purchase, MD;  Location: [facility];  Service: Orthopedics;  Laterality: Left;  120 mins    reports that she quit smoking about 38 years ago. Her smoking use included cigarettes. She has never used smokeless tobacco. She reports current alcohol  use. She reports that she does not use drugs. family history includes Arthritis in her mother and another family member; Diabetes in an other family member; Heart disease in her father. Allergies  Allergen Reactions   Cymbalta  [Duloxetine  Hcl] Hives and Itching   Effexor  [Venlafaxine ] Nausea Only   Hm Lidocaine  Patch [Lidocaine ] Other (See Comments)    Blisters the skin   Linzess  [Linaclotide ]      diarrhea   Other Other (See Comments)    SEEDED food due to stomach issues   Statins Nausea And Vomiting   Penicillins Hives and Rash    Has patient had a PCN reaction causing immediate rash, facial/tongue/throat swelling, SOB or lightheadedness with hypotension: Yes Has patient had a PCN reaction causing severe rash involving mucus membranes or skin necrosis: No Has patient had a PCN reaction that required hospitalization: No Has patient had a PCN reaction occurring within the last 10 years: Yes If all of the above answers are NO, then may proceed with Cephalosporin use.    Current Outpatient Medications on File Prior to Visit  Medication Sig Dispense Refill   acetaminophen  (TYLENOL ) 500 MG tablet Take 500 mg by mouth every 6 (six) hours as needed for headache (pain).     albuterol  (VENTOLIN  HFA) 108 (90 Base) MCG/ACT inhaler Inhale 2 puffs into the lungs every 6 (six) hours as needed for wheezing or shortness of breath.     aspirin  EC 81 MG tablet Take 1 tablet (81 mg total) by mouth daily. 90 tablet 3   clobetasol  cream (TEMOVATE ) 0.05 % APPLY 1 APPLICATION TOPICALLY TWICE DAILY AS NEEDED 30 g 11   clonazePAM  (KLONOPIN ) 0.5 MG tablet TAKE 1/2 TO 1 TABLET BY MOUTH TWICE A DAY AS NEEDED FOR ANXIETY 60 tablet 4   dicyclomine  (BENTYL ) 10 MG capsule TAKE 1 CAPSULE BY MOUTH 3 TIMES DAILY BEFORE MEALS *PLEASE SCHEDULE APPOINTMENT FOR FURTHER REFILLS* *REFILL REQUEST* (Patient taking differently: Take 10 mg by mouth 2 (two) times daily before a meal.) 90 capsule 10   docusate sodium  (COLACE) 100 MG capsule Take 3 capsules (300 mg total) by mouth daily. (Patient taking differently: Take 100 mg by mouth as needed for mild constipation.) 90 capsule 11   ezetimibe  (ZETIA ) 10 MG tablet TAKE 1 TABLET BY MOUTH ONCE DAILY 30 tablet 10   famotidine  (PEPCID ) 40 MG tablet TAKE 1 TABLET BY MOUTH ONCE DAILY 30 tablet 10   fluconazole  (DIFLUCAN ) 150 MG tablet TAKE 1 TABLET BY MOUTH EVERY 3 DAYS 2 tablet 11    fluticasone  (FLONASE ) 50 MCG/ACT nasal spray Place 2 sprays into both nostrils 2 (two) times daily as needed for allergies or rhinitis.     meclizine  (ANTIVERT ) 12.5 MG tablet Take 1 tablet (12.5 mg total) by mouth 3 (three) times daily. 30 tablet 3   Multiple Vitamin (MULTIVITAMIN WITH MINERALS) TABS tablet Take 1 tablet by mouth daily after lunch.     nitroGLYCERIN  (NITROSTAT ) 0.4 MG SL tablet DISSOLVE 1 TABLET UNDER THE TONGUE AS NEEDED FOR CHEST PAIN EVERY 5 MINUTES UP TO 3 TIMES. IF NO RELIEF CALL 911. *REFILL REQUEST* (Patient taking differently: Place 0.4 mg under the tongue every 5 (five) minutes as needed for chest pain (Up to 3 times). Keep on hand for when needed) 25 tablet  10   omeprazole  (PRILOSEC) 40 MG capsule TAKE 1 CAPSULE BY MOUTH ONCE DAILY 30 capsule 10   OVER THE COUNTER MEDICATION Place 1 drop into both eyes 2 (two) times daily as needed (dry eyes/ pataday). Over the counter eye drop for itching     OXcarbazepine  (TRILEPTAL ) 150 MG tablet TAKE 1 TABLET BY MOUTH TWICE DAILY 60 tablet 10   potassium chloride  SA (KLOR-CON  M) 20 MEQ tablet TAKE 1 TABLET BY MOUTH 3 TIMES DAILY 90 tablet 11   pregabalin  (LYRICA ) 100 MG capsule TAKE 1 TABLET BY MOUTH THREE TIMES DAILY 90 capsule 1   Probiotic Product (PROBIOTIC PO) Take 1 capsule by mouth daily after breakfast.     rosuvastatin  (CRESTOR ) 5 MG tablet Take 1 tablet (5 mg total) by mouth at bedtime. 90 tablet 3   Semaglutide ,0.25 or 0.5MG /DOS, (OZEMPIC , 0.25 OR 0.5 MG/DOSE,) 2 MG/3ML SOPN Inject 0.5 mg into the skin once a week. 0.25 mg [place] weekly x4 weeks, then 0.5 mg [place] weekly 6 mL 1   [DISCONTINUED] Calcium  Carbonate (CALCIUM  500 PO) Take 1 capsule by mouth every other day.      No current facility-administered medications on file prior to visit.        ROS:  All others reviewed and negative.  Objective        PE:  BP 116/72   Pulse (!) 53   Ht 5' 2 (1.575 m)   Wt 160 lb 9.6 oz (72.8 kg)   SpO2 98%   BMI 29.37 kg/m                  Constitutional: Pt appears in NAD               HENT: Head: NCAT.                Right Ear: External ear normal.                 Left Ear: External ear normal.                Eyes: . Pupils are equal, round, and reactive to light. Conjunctivae and EOM are normal               Nose: without d/c or deformity               Neck: Neck supple. Gross normal ROM               Cardiovascular: Normal rate and regular rhythm.                 Pulmonary/Chest: Effort normal and breath sounds without rales or wheezing.                Abd:  Soft, NT, ND, + BS, no organomegaly               Neurological: Pt is alert. At baseline orientation, motor abnormal with 4/5 LLE weakness; spine also with midline tender at the L3 level, also bilateral paraspinal spasm noted               Skin: Skin is warm. No rashes, no other new lesions, LE edema - none               Psychiatric: Pt behavior is normal without agitation   Micro: none  Cardiac tracings I have personally interpreted today:  none  Pertinent Radiological findings (summarize): none   Lab Results  Component Value Date  WBC 6.8 11/08/2023   HGB 13.1 11/08/2023   HCT 39.0 11/08/2023   PLT 219.0 11/08/2023   GLUCOSE 51 (L) 11/08/2023   CHOL 197 11/08/2023   TRIG 135.0 11/08/2023   HDL 67.40 11/08/2023   LDLDIRECT 113.0 07/21/2017   LDLCALC 102 (H) 11/08/2023   ALT 17 11/08/2023   AST 26 11/08/2023   NA 138 11/08/2023   K 3.4 (L) 11/08/2023   CL 99 11/08/2023   CREATININE 0.72 11/08/2023   BUN 12 11/08/2023   CO2 30 11/08/2023   TSH 3.07 11/08/2023   INR 1.00 01/03/2017   HGBA1C 7.2 (H) 11/08/2023   MICROALBUR 3.0 (H) 11/08/2023   Assessment/Plan:  Runell Kovich is a 82 y.o. Black or African American [2] female with  has a past medical history of Abdominal distension, gaseous (04/10/2017), Allergic rhinitis (03/13/2013), CAD (coronary artery disease) (05/28/2018), Cerebrovascular disease, Chest pain (10/12/2016),  Degeneration of lumbar intervertebral disc (08/02/2017), Diabetic neuropathy (05/12/2014), Diverticulitis of large intestine (05/12/2014), Dyslipidemia, Gastroesophageal reflux disease (03/18/2009), Hearing loss (02/01/2017), Hypercalcemia (10/12/2016), Hypertension, Hypokalemia (01/06/2020), Insomnia (03/20/2009), Irritable bowel syndrome (03/20/2009), Laryngopharyngeal reflux (LPR) (09/27/2016), Lumbar pain (08/23/2016), Mild neurocognitive disorder due to multiple etiologies (11/06/2020), Myofascial pain (05/19/2015), Neck pain (09/27/2016), Non-toxic multinodular goiter (05/18/2009), Obstructive sleep apnea (03/18/2009), Osteoarthritis of left knee (01/12/2017), Osteoarthritis of shoulder region, Pain in joint of right shoulder (01/21/2019), Presbycusis of both ears (09/27/2016), Seizure disorder, Situational anxiety, Spondylolisthesis (09/20/2017), Steatosis of liver (05/12/2014), Type 2 diabetes mellitus (05/12/2014), Unstable angina (01/05/2020), and Vertigo (04/13/2009).  CAD (coronary artery disease) Pt needs cardiology re establishment, will refer,, cont same tx  Type 2 diabetes mellitus Lab Results  Component Value Date   HGBA1C 7.2 (H) 11/08/2023   Stable, pt to continue current medical treatment ozempic  0.5 weekly   Osteoporosis Severe by recent dxa - pt willing for trial fosamax  70 mg weekly  Acute left-sided low back pain with left-sided sciatica Pt with persistent severe pain and now LLE weakness; also with recent fall one [place] the beach; today for tramadol  50 mg prn, prednisone  taper, muscle relaxer prn, also for LS spine plain films today r/o fx, and MRI LS Spine with consideration for ortho if needed  Followup: Return if symptoms worsen or fail to improve.  Lynwood Rush, MD 01/11/2024 1:45 PM [facility]

## 2024-01-11 NOTE — Assessment & Plan Note (Signed)
 Pt with persistent severe pain and now LLE weakness; also with recent fall [place]; today for tramadol  50 mg prn, prednisone  taper, muscle relaxer prn, also for LS spine plain films today r/o fx, and MRI LS Spine with consideration for ortho if needed

## 2024-01-11 NOTE — Assessment & Plan Note (Signed)
 Lab Results  Component Value Date   HGBA1C 7.2 (H) 11/08/2023   Stable, pt to continue current medical treatment ozempic  0.5 weekly

## 2024-01-18 ENCOUNTER — Ambulatory Visit: Payer: Self-pay | Admitting: Internal Medicine

## 2024-02-01 ENCOUNTER — Other Ambulatory Visit: Payer: Self-pay | Admitting: Internal Medicine

## 2024-02-12 ENCOUNTER — Encounter: Payer: Self-pay | Admitting: Internal Medicine

## 2024-02-12 NOTE — Progress Notes (Unsigned)
 Subjective:    Patient ID: [name], female    DOB: 02/11/1941, 83 y.o.   MRN: [id number]     HPI [name] is here for follow up of her chronic medical problems.  Saw Dr Norleen 7/17 for lower back pain that radiated to the left leg below the knee.  Had fallen 1 month prior.  She was prescribed tramadol , prednisone  taper, muscle relaxer.  X-rays ordered and MRI ordered.  X-ray showed multilevel osseous neural foraminal stenosis.  No acute fracture.   Has seen Dr Baird in the past @ [facility]  Medications and allergies reviewed with patient and updated if appropriate.  Current Outpatient Medications on File Prior to Visit  Medication Sig Dispense Refill   acetaminophen  (TYLENOL ) 500 MG tablet Take 500 mg by mouth every 6 (six) hours as needed for headache (pain).     albuterol  (VENTOLIN  HFA) 108 (90 Base) MCG/ACT inhaler Inhale 2 puffs into the lungs every 6 (six) hours as needed for wheezing or shortness of breath.     alendronate  (FOSAMAX ) 70 MG tablet Take 1 tablet (70 mg total) by mouth every 7 (seven) days. Take with a full glass of water on an empty stomach. 12 tablet 3   aspirin  EC 81 MG tablet Take 1 tablet (81 mg total) by mouth daily. 90 tablet 3   clobetasol  cream (TEMOVATE ) 0.05 % APPLY 1 APPLICATION TOPICALLY TWICE DAILY AS NEEDED 30 g 11   clonazePAM  (KLONOPIN ) 0.5 MG tablet TAKE 1/2 TO 1 TABLET BY MOUTH TWICE A DAY AS NEEDED FOR ANXIETY 60 tablet 4   cyclobenzaprine  (FLEXERIL ) 5 MG tablet Take 1 tablet (5 mg total) by mouth 3 (three) times daily as needed. 40 tablet 1   dicyclomine  (BENTYL ) 10 MG capsule TAKE 1 CAPSULE BY MOUTH 3 TIMES DAILY BEFORE MEALS *PLEASE SCHEDULE APPOINTMENT FOR FURTHER REFILLS* *REFILL REQUEST* (Patient taking differently: Take 10 mg by mouth 2 (two) times daily before a meal.) 90 capsule 10   docusate sodium  (COLACE) 100 MG capsule Take 3 capsules (300 mg total) by mouth daily. (Patient taking differently: Take 100 mg by mouth as  needed for mild constipation.) 90 capsule 11   ezetimibe  (ZETIA ) 10 MG tablet TAKE 1 TABLET BY MOUTH ONCE DAILY 30 tablet 10   famotidine  (PEPCID ) 40 MG tablet TAKE 1 TABLET BY MOUTH ONCE DAILY 30 tablet 10   fluconazole  (DIFLUCAN ) 150 MG tablet TAKE 1 TABLET BY MOUTH EVERY 3 DAYS 2 tablet 11   fluticasone  (FLONASE ) 50 MCG/ACT nasal spray Place 2 sprays into both nostrils 2 (two) times daily as needed for allergies or rhinitis.     meclizine  (ANTIVERT ) 12.5 MG tablet Take 1 tablet (12.5 mg total) by mouth 3 (three) times daily. 30 tablet 3   Multiple Vitamin (MULTIVITAMIN WITH MINERALS) TABS tablet Take 1 tablet by mouth daily after lunch.     nitroGLYCERIN  (NITROSTAT ) 0.4 MG SL tablet DISSOLVE 1 TABLET UNDER THE TONGUE AS NEEDED FOR CHEST PAIN EVERY 5 MINUTES UP TO 3 TIMES. IF NO RELIEF CALL 911. *REFILL REQUEST* (Patient taking differently: Place 0.4 mg under the tongue every 5 (five) minutes as needed for chest pain (Up to 3 times). Keep on hand for when needed) 25 tablet 10   omeprazole  (PRILOSEC) 40 MG capsule TAKE 1 CAPSULE BY MOUTH ONCE DAILY 30 capsule 10   OVER THE COUNTER MEDICATION Place 1 drop into both eyes 2 (two) times daily as needed (dry eyes/ pataday). Over the  counter eye drop for itching     OXcarbazepine  (TRILEPTAL ) 150 MG tablet TAKE 1 TABLET BY MOUTH TWICE DAILY 60 tablet 10   potassium chloride  SA (KLOR-CON  M) 20 MEQ tablet TAKE 1 TABLET BY MOUTH 3 TIMES DAILY 90 tablet 11   pregabalin  (LYRICA ) 100 MG capsule TAKE 1 CAPSULE BY MOUTH 3 TIMES DAILY 90 capsule 2   Probiotic Product (PROBIOTIC PO) Take 1 capsule by mouth daily after breakfast.     rosuvastatin  (CRESTOR ) 5 MG tablet Take 1 tablet (5 mg total) by mouth at bedtime. 90 tablet 3   Semaglutide ,0.25 or 0.5MG /DOS, (OZEMPIC , 0.25 OR 0.5 MG/DOSE,) 2 MG/3ML SOPN Inject 0.5 mg into the skin once a week. 0.25 mg [place] weekly x4 weeks, then 0.5 mg [place] weekly 6 mL 1   traMADol  (ULTRAM ) 50 MG tablet Take 1 tablet (50 mg total) by  mouth every 6 (six) hours as needed. 30 tablet 0   [DISCONTINUED] Calcium  Carbonate (CALCIUM  500 PO) Take 1 capsule by mouth every other day.      No current facility-administered medications on file prior to visit.     Review of Systems     Objective:  There were no vitals filed for this visit. BP Readings from Last 3 Encounters:  01/11/24 116/72  11/08/23 124/76  05/08/23 118/74   Wt Readings from Last 3 Encounters:  01/11/24 160 lb 9.6 oz (72.8 kg)  11/08/23 161 lb (73 kg)  05/08/23 151 lb (68.5 kg)   There is no height or weight on file to calculate BMI.    Physical Exam     Lab Results  Component Value Date   WBC 6.8 11/08/2023   HGB 13.1 11/08/2023   HCT 39.0 11/08/2023   PLT 219.0 11/08/2023   GLUCOSE 51 (L) 11/08/2023   CHOL 197 11/08/2023   TRIG 135.0 11/08/2023   HDL 67.40 11/08/2023   LDLDIRECT 113.0 07/21/2017   LDLCALC 102 (H) 11/08/2023   ALT 17 11/08/2023   AST 26 11/08/2023   NA 138 11/08/2023   K 3.4 (L) 11/08/2023   CL 99 11/08/2023   CREATININE 0.72 11/08/2023   BUN 12 11/08/2023   CO2 30 11/08/2023   TSH 3.07 11/08/2023   INR 1.00 01/03/2017   HGBA1C 7.2 (H) 11/08/2023   MICROALBUR 3.0 (H) 11/08/2023     Assessment & Plan:    See Problem List for Assessment and Plan of chronic medical problems.

## 2024-02-12 NOTE — Patient Instructions (Addendum)
      Follow up with Dr Baird for your back pain.     Call cardiology - Dr Kardie Tobb   [phone]    Medications changes include :   None    Return for follow up as scheduled.

## 2024-02-13 ENCOUNTER — Ambulatory Visit: Admitting: Internal Medicine

## 2024-02-13 VITALS — BP 114/80 | HR 60 | Temp 98.1°F | Ht 62.0 in | Wt 164.0 lb

## 2024-02-13 DIAGNOSIS — E1142 Type 2 diabetes mellitus with diabetic polyneuropathy: Secondary | ICD-10-CM | POA: Diagnosis not present

## 2024-02-13 DIAGNOSIS — M545 Low back pain, unspecified: Secondary | ICD-10-CM

## 2024-02-13 NOTE — Assessment & Plan Note (Signed)
 Chronic Has significant decrease sensation on exam Has not tolerated gabapentin , nortriptyline , Cymbalta  and Effexor  in the past Continue Lyrica  100 mg 3 times daily

## 2024-02-13 NOTE — Assessment & Plan Note (Addendum)
 Acute on chronic She fell in June, was seen last month  Lower back pain with radiating pain to the left knee S/p prednisone   Taking flexeril  5mg , tramadol  50 mg as needed, [place]  Back pain is a little better but still there MRI not done-she would like to see Dr. Baird before having the MRI done Continue tramadol  only as needed for severe pain-discussed concerns with long-term use and memory issues Continue muscle relaxer-Flexeril  5 mg twice daily as needed-only take if needed and it does seem to be helpful Already on Lyrica -continue current dose Follow up with Dr Baird

## 2024-02-13 NOTE — Assessment & Plan Note (Signed)
 Chronic A1c today-  Lab Results  Component Value Date   HGBA1C 7.2 (H) 11/08/2023   Sugars controlled for age She has been eating more sugar recently-ice cream and stressed that she needs to decrease that so that we do not need to adjust or change her medications Continue  Ozempic  0.5 mg weekly Follow-up as scheduled in November for blood work

## 2024-02-14 ENCOUNTER — Telehealth: Payer: Self-pay

## 2024-02-14 NOTE — Telephone Encounter (Signed)
 Copied from CRM #[id number]. Topic: General - Call Back - No Documentation >> Feb 14, 2024  3:01 PM Frederich PARAS wrote: Reason for CRM: pt needs a callback from [employer]. Callback # is [phone] please call pt at the earliest convience.

## 2024-02-16 NOTE — Telephone Encounter (Signed)
 Called pt to see if there is anything I could help her with in Karla's absence but no answer.

## 2024-02-19 NOTE — Telephone Encounter (Signed)
 Copied from CRM [id number]. Topic: General - Call Back - No Documentation >> Feb 19, 2024  1:30 PM Chiquita SQUIBB wrote: Reason for CRM: Patient is calling for [name], Marjorie did try contacting the patient back the other day due to Karla's absence. Patient is requesting a call back as soon as possible. Unable to reach CAL.

## 2024-02-20 NOTE — Telephone Encounter (Signed)
 Copied from CRM [id number]. Topic: General - Other >> Feb 20, 2024  9:44 AM [name] wrote: Reason for CRM: Patient called in stated she would like to speak with Tobias, has a question regarding a test  [phone]

## 2024-02-22 NOTE — Telephone Encounter (Signed)
 Message left for patient to return call to clinic.  If she calls back please see what test she is inquiring about and what she needs.  Thanks!

## 2024-02-23 ENCOUNTER — Telehealth: Payer: Self-pay

## 2024-02-23 NOTE — Telephone Encounter (Signed)
 Copied from CRM #[id number]. Topic: General - Other >> Feb 22, 2024  3:44 PM Burnard DEL wrote: Reason for CRM: Patient would like to talk to CMA about bone density test with Dr Baird.

## 2024-02-29 NOTE — Telephone Encounter (Signed)
 Message left for patient today to return call to clinic.  If she calls back please see what she is needing regarding Dr. Baird.

## 2024-03-04 ENCOUNTER — Other Ambulatory Visit: Payer: Self-pay | Admitting: Internal Medicine

## 2024-04-02 ENCOUNTER — Other Ambulatory Visit: Payer: Self-pay | Admitting: Internal Medicine

## 2024-04-04 DIAGNOSIS — Z961 Presence of intraocular lens: Secondary | ICD-10-CM | POA: Diagnosis not present

## 2024-04-04 DIAGNOSIS — E119 Type 2 diabetes mellitus without complications: Secondary | ICD-10-CM | POA: Diagnosis not present

## 2024-04-04 LAB — OPHTHALMOLOGY REPORT-SCANNED

## 2024-04-09 ENCOUNTER — Other Ambulatory Visit (HOSPITAL_COMMUNITY): Payer: Self-pay

## 2024-04-10 ENCOUNTER — Other Ambulatory Visit (HOSPITAL_COMMUNITY): Payer: Self-pay

## 2024-04-16 ENCOUNTER — Other Ambulatory Visit (HOSPITAL_COMMUNITY): Payer: Self-pay

## 2024-04-17 ENCOUNTER — Other Ambulatory Visit (HOSPITAL_COMMUNITY): Payer: Self-pay

## 2024-04-24 ENCOUNTER — Other Ambulatory Visit: Payer: Self-pay

## 2024-04-24 ENCOUNTER — Ambulatory Visit (INDEPENDENT_AMBULATORY_CARE_PROVIDER_SITE_OTHER)

## 2024-04-24 VITALS — Ht 62.5 in | Wt 164.0 lb

## 2024-04-24 DIAGNOSIS — Z0000 Encounter for general adult medical examination without abnormal findings: Secondary | ICD-10-CM

## 2024-04-24 DIAGNOSIS — H9193 Unspecified hearing loss, bilateral: Secondary | ICD-10-CM | POA: Diagnosis not present

## 2024-04-24 NOTE — Progress Notes (Signed)
 Subjective:   [name] is a 83 y.o. who presents for a Medicare Wellness preventive visit.  As a reminder, Annual Wellness Visits don't include a physical exam, and some assessments may be limited, especially if this visit is performed virtually. We may recommend an in-person follow-up visit with your provider if needed.  Visit Complete: Virtual I connected with  [name] on 04/24/24 by a audio enabled telemedicine application and verified that I am speaking with the correct person using two identifiers.  Patient Location: Home  Provider Location: Office/Clinic  I discussed the limitations of evaluation and management by telemedicine. The patient expressed understanding and agreed to proceed.  Vital Signs: Because this visit was a virtual/telehealth visit, some criteria may be missing or patient reported. Any vitals not documented were not able to be obtained and vitals that have been documented are patient reported.  VideoDeclined- This patient declined Librarian, academic. Therefore the visit was completed with audio only.  Persons Participating in Visit: Patient.  AWV Questionnaire: No: Patient Medicare AWV questionnaire was not completed prior to this visit.  Cardiac Risk Factors include: advanced age (>40men, >41 women);diabetes mellitus;dyslipidemia;Other (see comment), Risk factor comments: CAD; CVD     Objective:    Today's Vitals   04/24/24 1513  Weight: 164 lb (74.4 kg)  Height: 5' 2.5 (1.588 m)   Body mass index is 29.52 kg/m.     04/24/2024    3:14 PM 05/16/2023    1:53 PM 05/02/2023    3:51 AM 05/01/2023    6:28 PM 05/02/2022    2:19 PM 08/09/2021   11:21 AM 04/28/2021    4:12 PM  Advanced Directives  Does Patient Have a Medical Advance Directive? Yes Yes Yes Yes Yes Yes No  Type of Estate Agent of [place];Living will Healthcare Power of Attorney Living will Living will Healthcare  Power of [place];Living will    Does patient want to make changes to medical advance directive?   No - Patient declined      Copy of Healthcare Power of Attorney in Chart? No - copy requested Yes - validated most recent copy scanned in chart (See row information)   No - copy requested    Would patient like information on creating a medical advance directive?     No - Patient declined  No - Patient declined    Current Medications (verified) Outpatient Encounter Medications as of 04/24/2024  Medication Sig   acetaminophen  (TYLENOL ) 500 MG tablet Take 500 mg by mouth every 6 (six) hours as needed for headache (pain).   albuterol  (VENTOLIN  HFA) 108 (90 Base) MCG/ACT inhaler Inhale 2 puffs into the lungs every 6 (six) hours as needed for wheezing or shortness of breath.   alendronate  (FOSAMAX ) 70 MG tablet Take 1 tablet (70 mg total) by mouth every 7 (seven) days. Take with a full glass of water on an empty stomach.   aspirin  EC 81 MG tablet Take 1 tablet (81 mg total) by mouth daily.   clobetasol  cream (TEMOVATE ) 0.05 % APPLY 1 APPLICATION TOPICALLY TWICE DAILY AS NEEDED   clonazePAM  (KLONOPIN ) 0.5 MG tablet TAKE 1/2 TO 1 TABLET BY MOUTH TWICE A DAY AS NEEDED FOR ANXIETY   cyclobenzaprine  (FLEXERIL ) 5 MG tablet Take 1 tablet (5 mg total) by mouth 3 (three) times daily as needed.   dicyclomine  (BENTYL ) 10 MG capsule TAKE 1 CAPSULE BY MOUTH 3 TIMES DAILY BEFORE MEALS *PLEASE SCHEDULE APPOINTMENT FOR FURTHER REFILLS* *  REFILL REQUEST* (Patient taking differently: Take 10 mg by mouth 2 (two) times daily before a meal.)   docusate sodium  (COLACE) 100 MG capsule Take 3 capsules (300 mg total) by mouth daily. (Patient taking differently: Take 100 mg by mouth as needed for mild constipation.)   ezetimibe  (ZETIA ) 10 MG tablet TAKE 1 TABLET BY MOUTH ONCE DAILY   famotidine  (PEPCID ) 40 MG tablet TAKE 1 TABLET BY MOUTH ONCE DAILY   fluconazole  (DIFLUCAN ) 150 MG tablet TAKE 1 TABLET BY MOUTH EVERY 3 DAYS    fluticasone  (FLONASE ) 50 MCG/ACT nasal spray Place 2 sprays into both nostrils 2 (two) times daily as needed for allergies or rhinitis.   lubiprostone  ([name] ) 24 MCG capsule Take 24 mcg by mouth 2 (two) times daily.   meclizine  (ANTIVERT ) 12.5 MG tablet TAKE 1 TABLET (12.5 MG TOTAL) BY MOUTH 3 (THREE) TIMES DAILY.   Multiple Vitamin (MULTIVITAMIN WITH MINERALS) TABS tablet Take 1 tablet by mouth daily after lunch.   nitroGLYCERIN  (NITROSTAT ) 0.4 MG SL tablet DISSOLVE 1 TABLET UNDER THE TONGUE AS NEEDED FOR CHEST PAIN EVERY 5 MINUTES UP TO 3 TIMES. IF NO RELIEF CALL 911. *REFILL REQUEST*   omeprazole  (PRILOSEC) 40 MG capsule TAKE 1 CAPSULE BY MOUTH ONCE DAILY   OVER THE COUNTER MEDICATION Place 1 drop into both eyes 2 (two) times daily as needed (dry eyes/ pataday). Over the counter eye drop for itching   OXcarbazepine  (TRILEPTAL ) 150 MG tablet TAKE 1 TABLET BY MOUTH TWICE DAILY   potassium chloride  SA (KLOR-CON  M) 20 MEQ tablet TAKE 1 TABLET BY MOUTH 3 TIMES DAILY   pregabalin  (LYRICA ) 100 MG capsule TAKE 1 CAPSULE BY MOUTH 3 TIMES DAILY   Probiotic Product (PROBIOTIC PO) Take 1 capsule by mouth daily after breakfast.   rosuvastatin  (CRESTOR ) 5 MG tablet Take 1 tablet (5 mg total) by mouth at bedtime.   Semaglutide ,0.25 or 0.5MG /DOS, (OZEMPIC , 0.25 OR 0.5 MG/DOSE,) 2 MG/3ML SOPN Inject 0.5 mg into the skin once a week. 0.25 mg [place] weekly x4 weeks, then 0.5 mg [place] weekly   terconazole  (TERAZOL 3 ) 0.8 % vaginal cream INSERT 1 APPLICATORFUL VAGINALLY FOR 3 DAYS AS NEEDED   traMADol  (ULTRAM ) 50 MG tablet Take 1 tablet (50 mg total) by mouth every 6 (six) hours as needed.   [DISCONTINUED] Calcium  Carbonate (CALCIUM  500 PO) Take 1 capsule by mouth every other day.    No facility-administered encounter medications on file as of 04/24/2024.    Allergies (verified) Cymbalta  [duloxetine  hcl], Effexor  [venlafaxine ], Hm lidocaine  patch [lidocaine ], Linzess  [linaclotide ], Other, Statins, and Penicillins    History: Past Medical History:  Diagnosis Date   Abdominal distension, gaseous 04/10/2017   Allergic rhinitis 03/13/2013   CAD (coronary artery disease) 05/28/2018   Cerebrovascular disease    Chest pain 10/12/2016   Degeneration of lumbar intervertebral disc 08/02/2017   Diabetic neuropathy 05/12/2014   Did not tolerated cymbalta  - hives, itching Tried nortriptyline  - not effective Did not tolerate Effexor -nausea gabapentin -not effective    Diverticulitis of large intestine 05/12/2014   Dyslipidemia    Gastroesophageal reflux disease 03/18/2009   Hearing loss 02/01/2017   Hypercalcemia 10/12/2016   Hypertension    Hypokalemia 01/06/2020   Insomnia 03/20/2009   Irritable bowel syndrome 03/20/2009   With chronic constipation   Laryngopharyngeal reflux (LPR) 09/27/2016   Lumbar pain 08/23/2016   Mild neurocognitive disorder due to multiple etiologies 11/06/2020   Myofascial pain 05/19/2015   Neck pain 09/27/2016   Non-toxic multinodular goiter 05/18/2009  Obstructive sleep apnea 03/18/2009   Not using CPAP   Osteoarthritis of left knee 01/12/2017   Osteoarthritis of shoulder region    Pain in joint of right shoulder 01/21/2019   Presbycusis of both ears 09/27/2016   Seizure disorder    Situational anxiety    Spondylolisthesis 09/20/2017   Steatosis of liver 05/12/2014   Type 2 diabetes mellitus 05/12/2014   Unstable angina 01/05/2020   Vertigo 04/13/2009   Past Surgical History:  Procedure Laterality Date   ABDOMINAL HYSTERECTOMY  1970's   Partial   APPENDECTOMY     CHOLECYSTECTOMY     LEFT HEART CATH AND CORONARY ANGIOGRAPHY N/A 05/28/2018   Procedure: LEFT HEART CATH AND CORONARY ANGIOGRAPHY;  Surgeon: Claudene Victory ORN, MD;  Location: [facility];  Service: Cardiovascular;  Laterality: N/A;   LEFT HEART CATH AND CORONARY ANGIOGRAPHY N/A 01/06/2020   Procedure: LEFT HEART CATH AND CORONARY ANGIOGRAPHY;  Surgeon: Claudene Victory ORN, MD;  Location: [facility];  Service:  Cardiovascular;  Laterality: N/A;   LUMBAR EPIDURAL INJECTION Left 08/25/2017   SHOULDER SURGERY  2008   LT, post fall    TONSILLECTOMY AND ADENOIDECTOMY     TOTAL KNEE ARTHROPLASTY Left 01/12/2017   Procedure: LEFT TOTAL KNEE ARTHROPLASTY;  Surgeon: Duwayne Purchase, MD;  Location: [facility];  Service: Orthopedics;  Laterality: Left;  120 mins   Family History  Problem Relation Age of Onset   Arthritis Mother    Heart disease Father    Arthritis Other        Grandmother   Diabetes Other        Grandmother   Colon cancer Neg Hx    Stomach cancer Neg Hx    Pancreatic cancer Neg Hx    Social History   Socioeconomic History   Marital status: Widowed    Spouse name: Not on file   Number of children: 2   Years of education: 16   Highest education level: Bachelor's degree (e.g., BA, AB, BS)  Occupational History   Occupation: Retired    Comment: admin at [employer]  Tobacco Use   Smoking status: Former    Current packs/day: 0.00    Types: Cigarettes    Quit date: 10/19/1985    Years since quitting: 38.5   Smokeless tobacco: Never  Vaping Use   Vaping status: Never Used  Substance and Sexual Activity   Alcohol  use: Yes    Alcohol /week: 0.0 standard drinks of alcohol     Comment: rare glass of wine   Drug use: No   Sexual activity: Never  Other Topics Concern   Not on file  Social History Narrative   Patient lives in a one story home.  Has 2 children.  Retired from [employer].   Social Drivers of Corporate Investment Banker Strain: Low Risk  (04/24/2024)   Overall Financial Resource Strain (CARDIA)    Difficulty of Paying Living Expenses: Not hard at all  Food Insecurity: No Food Insecurity (04/24/2024)   Hunger Vital Sign    Worried About Running Out of Food in the Last Year: Never true    Ran Out of Food in the Last Year: Never true  Transportation Needs: No Transportation Needs (04/24/2024)   PRAPARE - Administrator, Civil Service (Medical): No    Lack of  Transportation (Non-Medical): No  Physical Activity: Insufficiently Active (04/24/2024)   Exercise Vital Sign    Days of Exercise per Week: 3 days  Minutes of Exercise per Session: 30 min  Stress: No Stress Concern Present (04/24/2024)   [employer] of Occupational Health - Occupational Stress Questionnaire    Feeling of Stress: Only a little  Social Connections: Moderately Integrated (04/24/2024)   Social Connection and Isolation Panel    Frequency of Communication with Friends and Family: More than three times a week    Frequency of Social Gatherings with Friends and Family: Once a week    Attends Religious Services: More than 4 times per year    Active Member of [employer] or Organizations: Yes    Attends Banker Meetings: 1 to 4 times per year    Marital Status: Widowed    Tobacco Counseling Counseling given: Not Answered    Clinical Intake:  Pre-visit preparation completed: Yes  Pain : No/denies pain     BMI - recorded: 29.52 Nutritional Status: BMI 25 -29 Overweight Nutritional Risks: None Diabetes: Yes CBG done?: No Did pt. bring in CBG monitor from home?: No  Lab Results  Component Value Date   HGBA1C 7.2 (H) 11/08/2023   HGBA1C 6.1 (A) 05/08/2023   HGBA1C 6.1 05/08/2023   HGBA1C 6.1 05/08/2023   HGBA1C 6.1 05/08/2023     How often do you need to have someone help you when you read instructions, pamphlets, or other written materials from your doctor or pharmacy?: 1 - Never  Interpreter Needed?: No  Information entered by :: Verdie Saba, CMA   Activities of Daily Living     04/24/2024    3:21 PM 05/16/2023    2:01 PM  In your present state of health, do you have any difficulty performing the following activities:  Hearing? 1 1  Comment referral to Audiologist   Vision? 0 0  Difficulty concentrating or making decisions? 0 0  Walking or climbing stairs? 1 1  Comment cane   Dressing or bathing? 0 0  Doing errands, shopping? 0 1   Preparing Food and eating ? N N  Using the Toilet? N N  In the past six months, have you accidently leaked urine? N N  Do you have problems with loss of bowel control? N Y  Managing your Medications? N N  Managing your Finances? N N  Housekeeping or managing your Housekeeping? N N    Patient Care Team: Geofm Glade PARAS, MD as PCP - General (Internal Medicine) Duwayne Purchase, MD (Orthopedic Surgery) Jesus Oliphant, MD Germaine Ozell CROME, MD (Neurology) Debera Jayson MATSU, MD (Cardiology) Shellia Oh, MD (Pulmonary Disease) Unice Pac, MD (Neurosurgery) Ivin Kocher, MD (Dermatology) Octavia, Charlie Hamilton, MD as Consulting Physician (Ophthalmology) Szabat, Toribio BROCKS, [facility] (Inactive) (Pharmacist)  I have updated your Care Teams any recent Medical Services you may have received from other providers in the past year.     Assessment:   This is a routine wellness examination for Lavene.  Hearing/Vision screen Hearing Screening - Comments:: Referral to an Audiology Vision Screening - Comments:: Wears rx glasses - up to date with routine eye exams with Charlie Octavia   Goals Addressed               This Visit's Progress     Patient Stated (pt-stated)        Patient stated she plans to continue to walk more       Depression Screen     04/24/2024    3:24 PM 02/13/2024   10:16 AM 11/08/2023    2:20 PM 05/16/2023    2:00 PM  08/04/2022   10:41 AM 05/02/2022    2:35 PM 03/14/2022    1:25 PM  PHQ 2/9 Scores  PHQ - 2 Score 0 0 0 0 0 0 0  PHQ- 9 Score 1 2 0 0 2      Fall Risk     04/24/2024    3:24 PM 02/13/2024   10:16 AM 11/08/2023    2:19 PM 08/04/2022   10:40 AM 05/02/2022    2:20 PM  Fall Risk   Falls in the past year? 0 1 0 0 0  Number falls in past yr: 0 0 0 0 0  Injury with Fall? 0 0 0 0 0  Risk for fall due to : No Fall Risks;Impaired balance/gait No Fall Risks No Fall Risks No Fall Risks Impaired balance/gait;Impaired mobility;Impaired vision;History of fall(s)   Follow up Falls evaluation completed;Falls prevention discussed Falls evaluation completed Falls evaluation completed Falls evaluation completed Falls evaluation completed      Data saved with a previous flowsheet row definition    MEDICARE RISK AT HOME:  Medicare Risk at Home Any stairs in or around the home?: No If so, are there any without handrails?: No Home free of loose throw rugs in walkways, pet beds, electrical cords, etc?: Yes Adequate lighting in your home to reduce risk of falls?: Yes Life alert?: No Use of a cane, walker or w/c?: Yes (cane/walker) Grab bars in the bathroom?: Yes Shower chair or bench in shower?: No Elevated toilet seat or a handicapped toilet?: No  TIMED UP AND GO:  Was the test performed?  No  Cognitive Function: 6CIT completed    11/05/2018   11:56 AM 10/11/2017    4:51 PM  MMSE - Mini Mental State Exam  Orientation to time 5 5  Orientation to Place 5 5  Registration 3 3  Attention/ Calculation 4 5  Recall 3 2  Language- name 2 objects 2 2  Language- repeat 1 1  Language- follow 3 step command 3 3  Language- read & follow direction 1 1  Write a sentence 1 1  Copy design 1 1  Total score 29 29        04/24/2024    3:27 PM 05/16/2023    1:56 PM 05/02/2022    2:21 PM  6CIT Screen  What Year? 0 points 0 points 0 points  What month? 0 points 0 points 0 points  What time? 0 points 0 points 0 points  Count back from 20 0 points 0 points 0 points  Months in reverse 0 points 0 points 0 points  Repeat phrase 0 points 4 points 0 points  Total Score 0 points 4 points 0 points    Immunizations Immunization History  Administered Date(s) Administered   Fluad Quad(high Dose 65+) 03/20/2019, 03/14/2022   Fluad Trivalent(High Dose 65+) 05/08/2023   INFLUENZA, HIGH DOSE SEASONAL PF 03/03/2017, 05/28/2018, 03/12/2020   Influenza Split 03/28/2011, 02/26/2012, 03/20/2013   Influenza Whole 02/25/2009, 04/01/2010   Influenza-Unspecified  09/05/2014, 03/27/2016, 02/25/2017, 03/05/2021   PFIZER Comirnaty(Gray Top)Covid-19 Tri-Sucrose Vaccine 11/15/2020   PFIZER(Purple Top)SARS-COV-2 Vaccination 07/16/2019, 08/06/2019, 03/23/2020   Pneumococcal Conjugate-13 09/14/2015   Pneumococcal Polysaccharide-23 06/28/2007, 10/11/2017   Tetanus 05/02/2012   Unspecified SARS-COV-2 Vaccination 04/26/2022   Zoster Recombinant(Shingrix) 01/27/2020, 05/19/2020    Screening Tests Health Maintenance  Topic Date Due   FOOT EXAM  04/26/2023   Influenza Vaccine  01/26/2024   COVID-19 Vaccine (6 - 2025-26 season) 02/26/2024   DTaP/Tdap/Td (1 - Tdap)  05/15/2024 (Originally 05/03/2012)   HEMOGLOBIN A1C  05/10/2024   Diabetic kidney evaluation - Urine ACR  11/07/2024   Diabetic kidney evaluation - eGFR measurement  12/15/2024   OPHTHALMOLOGY EXAM  04/04/2025   Medicare Annual Wellness (AWV)  04/24/2025   DEXA SCAN  11/21/2026   Pneumococcal Vaccine: 50+ Years  Completed   Zoster Vaccines- Shingrix  Completed   Meningococcal B Vaccine  Aged Out   Colonoscopy  Discontinued   Hepatitis C Screening  Discontinued    Health Maintenance Items Addressed: 04/24/2024  Additional Screening:  Vision Screening: Recommended annual ophthalmology exams for early detection of glaucoma and other disorders of the eye. Is the patient up to date with their annual eye exam?  Yes  Who is the provider or what is the name of the office in which the patient attends annual eye exams? Richard Glendia Groat with [facility]  Dental Screening: Recommended annual dental exams for proper oral hygiene  Community Resource Referral / Chronic Care Management: CRR required this visit?  No   CCM required this visit?  No   Plan:    I have personally reviewed and noted the following in the patient's chart:   Medical and social history Use of alcohol , tobacco or illicit drugs  Current medications and supplements including opioid prescriptions. Patient is currently  taking opioid prescriptions. Information provided to patient regarding non-opioid alternatives. Patient advised to discuss non-opioid treatment plan with their provider. Functional ability and status Nutritional status Physical activity Advanced directives List of other physicians Hospitalizations, surgeries, and ER visits in previous 12 months Vitals Screenings to include cognitive, depression, and falls Referrals and appointments  In addition, I have reviewed and discussed with patient certain preventive protocols, quality metrics, and best practice recommendations. A written personalized care plan for preventive services as well as general preventive health recommendations were provided to patient.   Verdie CHRISTELLA Saba, CMA   04/24/2024   After Visit Summary: (Declined) Due to this being a telephonic visit, with patients personalized plan was offered to patient but patient Declined AVS at this time   Notes: Nothing significant to report at this time.

## 2024-04-24 NOTE — Patient Instructions (Signed)
 Ms. [name],  Thank you for taking the time for your Medicare Wellness Visit. I appreciate your continued commitment to your health goals. Please review the care plan we discussed, and feel free to reach out if I can assist you further.  Medicare recommends these wellness visits once per year to help you and your care team stay ahead of potential health issues. These visits are designed to focus on prevention, allowing your provider to concentrate on managing your acute and chronic conditions during your regular appointments.  Please note that Annual Wellness Visits do not include a physical exam. Some assessments may be limited, especially if the visit was conducted virtually. If needed, we may recommend a separate in-person follow-up with your provider.  Ongoing Care Seeing your primary care provider every 3 to 6 months helps us  monitor your health and provide consistent, personalized care.   Referrals If a referral was made during today's visit and you haven't received any updates within two weeks, please contact the referred provider directly to check on the status.  Recommended Screenings:  Health Maintenance  Topic Date Due   Complete foot exam   04/26/2023   Flu Shot  01/26/2024   COVID-19 Vaccine (6 - 2025-26 season) 02/26/2024   DTaP/Tdap/Td vaccine (1 - Tdap) 05/15/2024*   Hemoglobin A1C  05/10/2024   Yearly kidney health urinalysis for diabetes  11/07/2024   Yearly kidney function blood test for diabetes  12/15/2024   Eye exam for diabetics  04/04/2025   Medicare Annual Wellness Visit  04/24/2025   DEXA scan (bone density measurement)  11/21/2026   Pneumococcal Vaccine for age over 69  Completed   Zoster (Shingles) Vaccine  Completed   Meningitis B Vaccine  Aged Out   Colon Cancer Screening  Discontinued   Hepatitis C Screening  Discontinued  *Topic was postponed. The date shown is not the original due date.       04/24/2024    3:14 PM  Advanced Directives  Does  Patient Have a Medical Advance Directive? Yes  Type of Estate Agent of [place];Living will  Copy of Healthcare Power of Attorney in Chart? No - copy requested   Advance Care Planning is important because it: Ensures you receive medical care that aligns with your values, goals, and preferences. Provides guidance to your family and loved ones, reducing the emotional burden of decision-making during critical moments.  Vision: Annual vision screenings are recommended for early detection of glaucoma, cataracts, and diabetic retinopathy. These exams can also reveal signs of chronic conditions such as diabetes and high blood pressure.  Dental: Annual dental screenings help detect early signs of oral cancer, gum disease, and other conditions linked to overall health, including heart disease and diabetes.  Managing Pain Without Opioids Opioids are strong medicines used to treat moderate to severe pain. For some people, especially those who have long-term (chronic) pain, opioids may not be the best choice for pain management due to: Side effects like nausea, constipation, and sleepiness. The risk of addiction (opioid use disorder). The longer you take opioids, the greater your risk of addiction. Pain that lasts for more than 3 months is called chronic pain. Managing chronic pain usually requires more than one approach and is often provided by a team of health care providers working together (multidisciplinary approach). Pain management may be done at a pain management center or pain clinic. How to manage pain without the use of opioids Use non-opioid medicines Non-opioid medicines for pain may include: Over-the-counter  or prescription non-steroidal anti-inflammatory drugs (NSAIDs). These may be the first medicines used for pain. They work well for muscle and bone pain, and they reduce swelling. Acetaminophen . This over-the-counter medicine may work well for milder pain but not  swelling. Antidepressants. These may be used to treat chronic pain. A certain type of antidepressant (tricyclics) is often used. These medicines are given in lower doses for pain than when used for depression. Anticonvulsants. These are usually used to treat seizures but may also reduce nerve (neuropathic) pain. Muscle relaxants. These relieve pain caused by sudden muscle tightening (spasms). You may also use a pain medicine that is applied to the skin as a patch, cream, or gel (topical analgesic), such as a numbing medicine. These may cause fewer side effects than medicines taken by mouth. Do certain therapies as directed Some therapies can help with pain management. They include: Physical therapy. You will do exercises to gain strength and flexibility. A physical therapist may teach you exercises to move and stretch parts of your body that are weak, stiff, or painful. You can learn these exercises at physical therapy visits and practice them at home. Physical therapy may also involve: Massage. Heat wraps or applying heat or cold to affected areas. Electrical signals that interrupt pain signals (transcutaneous electrical nerve stimulation, TENS). Weak lasers that reduce pain and swelling (low-level laser therapy). Signals from your body that help you learn to regulate pain (biofeedback). Occupational therapy. This helps you to learn ways to function at home and work with less pain. Recreational therapy. This involves trying new activities or hobbies, such as a physical activity or drawing. Mental health therapy, including: Cognitive behavioral therapy (CBT). This helps you learn coping skills for dealing with pain. Acceptance and commitment therapy (ACT) to change the way you think and react to pain. Relaxation therapies, including muscle relaxation exercises and mindfulness-based stress reduction. Pain management counseling. This may be individual, family, or group counseling.  Receive medical  treatments Medical treatments for pain management include: Nerve block injections. These may include a pain blocker and anti-inflammatory medicines. You may have injections: Near the spine to relieve chronic back or neck pain. Into joints to relieve back or joint pain. Into nerve areas that supply a painful area to relieve body pain. Into muscles (trigger point injections) to relieve some painful muscle conditions. A medical device placed near your spine to help block pain signals and relieve nerve pain or chronic back pain (spinal cord stimulation device). Acupuncture. Follow these instructions at home Medicines Take over-the-counter and prescription medicines only as told by your health care provider. If you are taking pain medicine, ask your health care providers about possible side effects to watch out for. Do not drive or use heavy machinery while taking prescription opioid pain medicine. Lifestyle  Do not use drugs or alcohol  to reduce pain. If you drink alcohol , limit how much you have to: 0-1 drink a day for women who are not pregnant. 0-2 drinks a day for men. Know how much alcohol  is in a drink. In the [place] one drink equals one 12 oz bottle of beer (355 mL), one 5 oz glass of wine (148 mL), or one 1 oz glass of hard liquor (44 mL). Do not use any products that contain nicotine or tobacco. These products include cigarettes, chewing tobacco, and vaping devices, such as e-cigarettes. If you need help quitting, ask your health care provider. Eat a healthy diet and maintain a healthy weight. Poor diet and excess weight may  make pain worse. Eat foods that are high in fiber. These include fresh fruits and vegetables, whole grains, and beans. Limit foods that are high in fat and processed sugars, such as fried and sweet foods. Exercise regularly. Exercise lowers stress and may help relieve pain. Ask your health care provider what activities and exercises are safe for you. If your health  care provider approves, join an exercise class that combines movement and stress reduction. Examples include yoga and tai chi. Get enough sleep. Lack of sleep may make pain worse. Lower stress as much as possible. Practice stress reduction techniques as told by your therapist. General instructions Work with all your pain management providers to find the treatments that work best for you. You are an important member of your pain management team. There are many things you can do to reduce pain on your own. Consider joining an online or in-person support group for people who have chronic pain. Keep all follow-up visits. This is important. Where to find more information You can find more information about managing pain without opioids from: American Academy of Pain Medicine: painmed.org Institute for Chronic Pain: instituteforchronicpain.org American Chronic Pain Association: theacpa.org Contact a health care provider if: You have side effects from pain medicine. Your pain gets worse or does not get better with treatments or home therapy. You are struggling with anxiety or depression. Summary Many types of pain can be managed without opioids. Chronic pain may respond better to pain management without opioids. Pain is best managed when you and a team of health care providers work together. Pain management without opioids may include non-opioid medicines, medical treatments, physical therapy, mental health therapy, and lifestyle changes. Tell your health care providers if your pain gets worse or is not being managed well enough. This information is not intended to replace advice given to you by your health care provider. Make sure you discuss any questions you have with your health care provider. Document Revised: 09/23/2020 Document Reviewed: 09/23/2020 Elsevier Patient Education  2024 [employer].

## 2024-04-30 ENCOUNTER — Other Ambulatory Visit (HOSPITAL_COMMUNITY): Payer: Self-pay

## 2024-04-30 ENCOUNTER — Encounter: Payer: Self-pay | Admitting: Internal Medicine

## 2024-04-30 MED ORDER — LUBIPROSTONE 24 MCG PO CAPS: 24.0000 ug | ORAL_CAPSULE | Freq: Two times a day (BID) | ORAL | 1 refills | Status: DC

## 2024-04-30 MED ORDER — LUBIPROSTONE 24 MCG PO CAPS
24.0000 ug | ORAL_CAPSULE | Freq: Two times a day (BID) | ORAL | 1 refills | Status: AC
  Filled 2024-05-16 (×2): qty 60, 30d supply, fill #0
  Filled 2024-06-17: qty 60, 30d supply, fill #1
  Filled 2024-07-19: qty 60, 30d supply, fill #2

## 2024-04-30 NOTE — Patient Instructions (Addendum)
     Flu immunization administered today.     You can get the tetanus and covid vaccines at the pharmacy.   Blood work was ordered.       Medications changes include :   None   Do not take any of your as needed medications if you do not need them - less of those medications are better   A referral was ordered for [facility] regarding your sleep apnea and [facility] for your seizure disorder and memory someone will call you to schedule an appointment.     Return in about 6 months (around 10/29/2024) for Physical Exam.

## 2024-04-30 NOTE — Progress Notes (Unsigned)
 Subjective:    Patient ID: [name], female    DOB: 29-Dec-1940, 83 y.o.   MRN: [id number]     HPI [name] is here for follow up of her chronic medical problems.  Discussed the use of AI scribe software for clinical note transcription with the patient, who gave verbal consent to proceed.  History of Present Illness [name] is an 83 year old female who presents for medication review and management.  She is here to review her current medications and discuss her vaccination status. She is considering getting a flu shot today and has questions about the COVID and tetanus vaccines, which she plans to get at the pharmacy. She has completed her pneumonia vaccinations.  She manages her medications through [facility], which mails her medications. She has experienced issues with missing medications in her packs and is considering transferring her prescriptions. She is unsure if she is taking alendronate  once a week. She takes Ozempic  once a week, a baby aspirin  daily, and clonazepam  as needed for anxiety, although she has been taking it more regularly than intended. She also has Flexeril  (cyclobenzaprine ) for muscle relaxation, which she uses as needed. She takes a stool softener and cholesterol medications regularly. She uses tramadol  for pain and Lyrica  for nerve pain. Omeprazole  is used for heartburn. Meclizine  is taken as needed for dizziness, which she associates with high salt intake. She takes [name]  (lubiprostone ) for constipation, but reports it is not fully controlled, requiring her to strain at times.  She reports difficulty swallowing pills, particularly on the left side of her throat, and sometimes uses applesauce to help swallow them. No issues with swallowing food. She experiences heaviness in her throat and occasional dizziness, especially when bending over. She sometimes uses nitroglycerin  for dizziness and heaviness in her chest, which she  associates with the dizziness.  She is working on a hearing plan and has noted difficulties with her hearing. She is not currently using her CPAP machine for sleep apnea due to discomfort. She has a history of seizure disorder and has previously seen neurology for this condition. She experiences occasional dizziness and has a history of heart issues, for which she has seen cardiology in the past.  She engages in some exercise indoors due to weather constraints and reports difficulty with outdoor activities due to temperature extremes. She is dealing with family issues, including a recent death and sickness, which have impacted her ability to manage phone communications.     Medications and allergies reviewed with patient and updated if appropriate.  Current Outpatient Medications on File Prior to Visit  Medication Sig Dispense Refill   acetaminophen  (TYLENOL ) 500 MG tablet Take 500 mg by mouth every 6 (six) hours as needed for headache (pain).     albuterol  (VENTOLIN  HFA) 108 (90 Base) MCG/ACT inhaler Inhale 2 puffs into the lungs every 6 (six) hours as needed for wheezing or shortness of breath.     alendronate  (FOSAMAX ) 70 MG tablet Take 1 tablet (70 mg total) by mouth every 7 (seven) days. Take with a full glass of water on an empty stomach. 12 tablet 3   aspirin  EC 81 MG tablet Take 1 tablet (81 mg total) by mouth daily. 90 tablet 3   clobetasol  cream (TEMOVATE ) 0.05 % APPLY 1 APPLICATION TOPICALLY TWICE DAILY AS NEEDED 30 g 11   clonazePAM  (KLONOPIN ) 0.5 MG tablet TAKE 1/2 TO 1 TABLET BY MOUTH TWICE A DAY AS NEEDED FOR ANXIETY  60 tablet 5   cyclobenzaprine  (FLEXERIL ) 5 MG tablet Take 1 tablet (5 mg total) by mouth 3 (three) times daily as needed. 40 tablet 1   dicyclomine  (BENTYL ) 10 MG capsule TAKE 1 CAPSULE BY MOUTH 3 TIMES DAILY BEFORE MEALS *PLEASE SCHEDULE APPOINTMENT FOR FURTHER REFILLS* *REFILL REQUEST* (Patient taking differently: Take 10 mg by mouth 2 (two) times daily before a  meal.) 90 capsule 10   docusate sodium  (COLACE) 100 MG capsule Take 3 capsules (300 mg total) by mouth daily. (Patient taking differently: Take 100 mg by mouth as needed for mild constipation.) 90 capsule 11   ezetimibe  (ZETIA ) 10 MG tablet TAKE 1 TABLET BY MOUTH ONCE DAILY 30 tablet 10   famotidine  (PEPCID ) 40 MG tablet TAKE 1 TABLET BY MOUTH ONCE DAILY 30 tablet 10   fluconazole  (DIFLUCAN ) 150 MG tablet TAKE 1 TABLET BY MOUTH EVERY 3 DAYS 2 tablet 11   fluticasone  (FLONASE ) 50 MCG/ACT nasal spray Place 2 sprays into both nostrils 2 (two) times daily as needed for allergies or rhinitis.     lubiprostone  ([name] ) 24 MCG capsule Take 24 mcg by mouth 2 (two) times daily.     lubiprostone  ([name] ) 24 MCG capsule Take 1 capsule (24 mcg total) by mouth 2 (two) times daily with meals 180 capsule 1   meclizine  (ANTIVERT ) 12.5 MG tablet TAKE 1 TABLET (12.5 MG TOTAL) BY MOUTH 3 (THREE) TIMES DAILY. 30 tablet 11   Multiple Vitamin (MULTIVITAMIN WITH MINERALS) TABS tablet Take 1 tablet by mouth daily after lunch.     nitroGLYCERIN  (NITROSTAT ) 0.4 MG SL tablet DISSOLVE 1 TABLET UNDER THE TONGUE AS NEEDED FOR CHEST PAIN EVERY 5 MINUTES UP TO 3 TIMES. IF NO RELIEF CALL 911. *REFILL REQUEST* 25 tablet 10   omeprazole  (PRILOSEC) 40 MG capsule TAKE 1 CAPSULE BY MOUTH ONCE DAILY 30 capsule 10   OXcarbazepine  (TRILEPTAL ) 150 MG tablet TAKE 1 TABLET BY MOUTH TWICE DAILY 180 tablet 1   potassium chloride  SA (KLOR-CON  M) 20 MEQ tablet TAKE 1 TABLET BY MOUTH 3 TIMES DAILY 90 tablet 11   pregabalin  (LYRICA ) 100 MG capsule TAKE 1 CAPSULE BY MOUTH 3 TIMES DAILY 90 capsule 2   Probiotic Product (PROBIOTIC PO) Take 1 capsule by mouth daily after breakfast.     rosuvastatin  (CRESTOR ) 5 MG tablet Take 1 tablet (5 mg total) by mouth at bedtime. 90 tablet 3   Semaglutide ,0.25 or 0.5MG /DOS, (OZEMPIC , 0.25 OR 0.5 MG/DOSE,) 2 MG/3ML SOPN Inject 0.25 mg under the skin once weekly for 4 weeks, then inject 0.5 mg  weekly 6 mL 1    terconazole  (TERAZOL 3 ) 0.8 % vaginal cream INSERT 1 APPLICATORFUL VAGINALLY FOR 3 DAYS AS NEEDED 20 g 11   traMADol  (ULTRAM ) 50 MG tablet Take 1 tablet (50 mg total) by mouth every 6 (six) hours as needed. 30 tablet 0   [DISCONTINUED] Calcium  Carbonate (CALCIUM  500 PO) Take 1 capsule by mouth every other day.      No current facility-administered medications on file prior to visit.     Review of Systems  Constitutional:  Negative for fever.  HENT:  Positive for trouble swallowing (pills only).   Respiratory:  Positive for cough. Negative for shortness of breath and wheezing.   Cardiovascular:  Negative for chest pain, palpitations and leg swelling.  Gastrointestinal:        [name] controlled  Neurological:  Positive for dizziness (occ). Negative for light-headedness and headaches.       Objective:   Vitals:  05/01/24 1028  BP: 118/80  Pulse: 82  Temp: 98.4 F (36.9 C)  SpO2: 93%   BP Readings from Last 3 Encounters:  05/01/24 118/80  02/13/24 114/80  01/11/24 116/72   Wt Readings from Last 3 Encounters:  05/01/24 163 lb (73.9 kg)  04/24/24 164 lb (74.4 kg)  02/13/24 164 lb (74.4 kg)   Body mass index is 29.34 kg/m.    Physical Exam Constitutional:      General: She is not in acute distress.    Appearance: Normal appearance.  HENT:     Head: Normocephalic and atraumatic.  Eyes:     Conjunctiva/sclera: Conjunctivae normal.  Cardiovascular:     Rate and Rhythm: Normal rate and regular rhythm.     Heart sounds: Normal heart sounds.  Pulmonary:     Effort: Pulmonary effort is normal. No respiratory distress.     Breath sounds: Normal breath sounds. No wheezing.  Musculoskeletal:     Cervical back: Neck supple.     Right lower leg: No edema.     Left lower leg: No edema.  Lymphadenopathy:     Cervical: No cervical adenopathy.  Skin:    General: Skin is warm and dry.     Findings: No rash.  Neurological:     Mental Status: She is alert. Mental status is at  baseline.  Psychiatric:        Mood and Affect: Mood normal.        Behavior: Behavior normal.        Lab Results  Component Value Date   WBC 6.8 11/08/2023   HGB 13.1 11/08/2023   HCT 39.0 11/08/2023   PLT 219.0 11/08/2023   GLUCOSE 51 (L) 11/08/2023   CHOL 197 11/08/2023   TRIG 135.0 11/08/2023   HDL 67.40 11/08/2023   LDLDIRECT 113.0 07/21/2017   LDLCALC 102 (H) 11/08/2023   ALT 17 11/08/2023   AST 26 11/08/2023   NA 138 11/08/2023   K 3.4 (L) 11/08/2023   CL 99 11/08/2023   CREATININE 0.72 11/08/2023   BUN 12 11/08/2023   CO2 30 11/08/2023   TSH 3.07 11/08/2023   INR 1.00 01/03/2017   HGBA1C 7.2 (H) 11/08/2023   MICROALBUR 3.0 (H) 11/08/2023     Assessment & Plan:    See Problem List for Assessment and Plan of chronic medical problems.

## 2024-05-01 ENCOUNTER — Telehealth: Payer: Self-pay

## 2024-05-01 ENCOUNTER — Ambulatory Visit: Admitting: Internal Medicine

## 2024-05-01 ENCOUNTER — Other Ambulatory Visit (HOSPITAL_COMMUNITY): Payer: Self-pay

## 2024-05-01 VITALS — BP 118/80 | HR 82 | Temp 98.4°F | Ht 62.5 in | Wt 163.0 lb

## 2024-05-01 DIAGNOSIS — K588 Other irritable bowel syndrome: Secondary | ICD-10-CM | POA: Diagnosis not present

## 2024-05-01 DIAGNOSIS — Z23 Encounter for immunization: Secondary | ICD-10-CM

## 2024-05-01 DIAGNOSIS — G40909 Epilepsy, unspecified, not intractable, without status epilepticus: Secondary | ICD-10-CM

## 2024-05-01 DIAGNOSIS — R1310 Dysphagia, unspecified: Secondary | ICD-10-CM | POA: Diagnosis not present

## 2024-05-01 DIAGNOSIS — E785 Hyperlipidemia, unspecified: Secondary | ICD-10-CM | POA: Diagnosis not present

## 2024-05-01 DIAGNOSIS — E1169 Type 2 diabetes mellitus with other specified complication: Secondary | ICD-10-CM

## 2024-05-01 DIAGNOSIS — E1142 Type 2 diabetes mellitus with diabetic polyneuropathy: Secondary | ICD-10-CM

## 2024-05-01 DIAGNOSIS — F418 Other specified anxiety disorders: Secondary | ICD-10-CM

## 2024-05-01 DIAGNOSIS — M810 Age-related osteoporosis without current pathological fracture: Secondary | ICD-10-CM | POA: Diagnosis not present

## 2024-05-01 DIAGNOSIS — E876 Hypokalemia: Secondary | ICD-10-CM

## 2024-05-01 DIAGNOSIS — I2510 Atherosclerotic heart disease of native coronary artery without angina pectoris: Secondary | ICD-10-CM | POA: Diagnosis not present

## 2024-05-01 DIAGNOSIS — K5900 Constipation, unspecified: Secondary | ICD-10-CM

## 2024-05-01 DIAGNOSIS — E1140 Type 2 diabetes mellitus with diabetic neuropathy, unspecified: Secondary | ICD-10-CM | POA: Diagnosis not present

## 2024-05-01 DIAGNOSIS — F0670 Mild neurocognitive disorder due to known physiological condition without behavioral disturbance: Secondary | ICD-10-CM

## 2024-05-01 DIAGNOSIS — K219 Gastro-esophageal reflux disease without esophagitis: Secondary | ICD-10-CM | POA: Diagnosis not present

## 2024-05-01 DIAGNOSIS — G4733 Obstructive sleep apnea (adult) (pediatric): Secondary | ICD-10-CM

## 2024-05-01 LAB — CBC WITH DIFFERENTIAL/PLATELET
Basophils Absolute: 0 K/uL (ref 0.0–0.1)
Basophils Relative: 0.6 % (ref 0.0–3.0)
Eosinophils Absolute: 0.1 K/uL (ref 0.0–0.7)
Eosinophils Relative: 1.2 % (ref 0.0–5.0)
HCT: 39 % (ref 36.0–46.0)
Hemoglobin: 13 g/dL (ref 12.0–15.0)
Lymphocytes Relative: 50.2 % — ABNORMAL HIGH (ref 12.0–46.0)
Lymphs Abs: 2.8 K/uL (ref 0.7–4.0)
MCHC: 33.5 g/dL (ref 30.0–36.0)
MCV: 91.3 fl (ref 78.0–100.0)
Monocytes Absolute: 0.6 K/uL (ref 0.1–1.0)
Monocytes Relative: 9.8 % (ref 3.0–12.0)
Neutro Abs: 2.1 K/uL (ref 1.4–7.7)
Neutrophils Relative %: 38.2 % — ABNORMAL LOW (ref 43.0–77.0)
Platelets: 232 K/uL (ref 150.0–400.0)
RBC: 4.27 Mil/uL (ref 3.87–5.11)
RDW: 13.5 % (ref 11.5–15.5)
WBC: 5.6 K/uL (ref 4.0–10.5)

## 2024-05-01 LAB — COMPREHENSIVE METABOLIC PANEL WITH GFR
ALT: 18 U/L (ref 0–35)
AST: 27 U/L (ref 0–37)
Albumin: 4.3 g/dL (ref 3.5–5.2)
Alkaline Phosphatase: 63 U/L (ref 39–117)
BUN: 11 mg/dL (ref 6–23)
CO2: 30 meq/L (ref 19–32)
Calcium: 10.1 mg/dL (ref 8.4–10.5)
Chloride: 102 meq/L (ref 96–112)
Creatinine, Ser: 0.77 mg/dL (ref 0.40–1.20)
GFR: 72.37 mL/min (ref >60.00)
Glucose, Bld: 91 mg/dL (ref 70–99)
Potassium: 3.9 meq/L (ref 3.5–5.1)
Sodium: 140 meq/L (ref 135–145)
Total Bilirubin: 0.2 mg/dL (ref 0.2–1.2)
Total Protein: 7.8 g/dL (ref 6.0–8.3)

## 2024-05-01 LAB — LIPID PANEL
Cholesterol: 207 mg/dL — ABNORMAL HIGH (ref 0–200)
HDL: 63 mg/dL (ref >39.00)
LDL Cholesterol: 123 mg/dL — ABNORMAL HIGH (ref 0–99)
NonHDL: 144.32
Total CHOL/HDL Ratio: 3
Triglycerides: 109 mg/dL (ref 0.0–149.0)
VLDL: 21.8 mg/dL (ref 0.0–40.0)

## 2024-05-01 LAB — HEMOGLOBIN A1C: Hgb A1c MFr Bld: 7.3 % — ABNORMAL HIGH (ref 4.6–6.5)

## 2024-05-01 MED ORDER — MECLIZINE HCL 12.5 MG PO TABS: 12.5000 mg | ORAL_TABLET | Freq: Three times a day (TID) | ORAL | Status: DC | PRN

## 2024-05-01 MED ORDER — DOCUSATE SODIUM 100 MG PO CAPS: 100.0000 mg | ORAL_CAPSULE | Freq: Every day | ORAL | Status: AC

## 2024-05-01 NOTE — Assessment & Plan Note (Addendum)
 Chronic Not taking Fosamax  70 mg weekly-started 12/2023 - ? Never picked it up She will let us  know which pharmacy she is switching to so that we can start this Encouraged regular exercise Stressed taking calcium  and vitamin D daily Check vitamin D level

## 2024-05-01 NOTE — Assessment & Plan Note (Addendum)
 Chronic Not using CPAP nightly Refer to pulm  - may benefit from a different mask or machine Stressed to her the importance of treating her sleep apnea to help prevent further memory issues

## 2024-05-01 NOTE — Assessment & Plan Note (Signed)
Chronic CMP Continue potassium 20 mEq 3 times a day

## 2024-05-01 NOTE — Assessment & Plan Note (Addendum)
 Has chronic constipation Taking [name]  twice daily, but still not ideally controlled Not taking stool softener daily - advised taking daily Taking dicyclomine  3 times daily

## 2024-05-01 NOTE — Assessment & Plan Note (Signed)
 Chronic Has significant decrease sensation on exam Has not tolerated gabapentin , nortriptyline , Cymbalta  and Effexor  in the past Continue Lyrica  100 mg 3 times daily

## 2024-05-01 NOTE — Assessment & Plan Note (Signed)
 Saw Dr Richie 10/2020  She admits that her memory is not great Stressed that she needs to be using her CPAP nightly which she is not currently doing-states she may need a new machine-referral to pulmonary who she used to see in the past for this Has poor hearing and is looking into getting hearing aids-stressed that she needs to get hearing aids as this can also help prevent worsening of her memory Encouraged regular exercise Encouraged her to only take as needed medications when they are needed-discussed that less of them is better and some of those can potentially affect memory

## 2024-05-01 NOTE — Assessment & Plan Note (Addendum)
 Chronic Associated with diabetic neuropathy  Lab Results  Component Value Date   HGBA1C 7.2 (H) 11/08/2023   Sugars controlled for age Check A1c Continue  Ozempic  0.5 mg weekly

## 2024-05-01 NOTE — Assessment & Plan Note (Addendum)
 Chronic Not ideally controlled Taking [name]  24 mcg twice daily Stressed that she takes a stool softener daily

## 2024-05-01 NOTE — Assessment & Plan Note (Addendum)
 Chronic Controlled, Stable Continue clonazepam  0.25-0.5 mg twice daily as needed-she is taking this regularly and advised her only to take as needed.  Discussed concerns with affecting memory Encouraged her to take half a pill only Goal would be to get her off of this completely

## 2024-05-01 NOTE — Assessment & Plan Note (Signed)
 Chronic Regular exercise and healthy diet encouraged Check lipid panel, CMP, TSH Continue Zetia  10 mg daily, Crestor  5 mg daily

## 2024-05-01 NOTE — Assessment & Plan Note (Signed)
 Chronic Denies any seizure-like activity Controlled Continue oxycarbamazepine 150 mg twice daily Has not seen neurology in a while-advised neurology follow-up

## 2024-05-01 NOTE — Assessment & Plan Note (Addendum)
 Chronic Following with cardiology-uses nitroglycerin  as needed which is effective Having some chest pain - has discussed with cardiology Continue aspirin  81 mg daily, Crestor  5 mg daily, Zetia  10 mg daily

## 2024-05-01 NOTE — Assessment & Plan Note (Signed)
Chronic GERD controlled Continue omeprazole 40 mg daily, pepcid 40 mg daily  

## 2024-05-01 NOTE — Telephone Encounter (Signed)
 Copied from CRM [id number]. Topic: General - Other >> May 01, 2024  2:45 PM [name] wrote: Reason for CRM: Patient called in regarding needing address for medication for the pharmacy wanted this sent to [name] and Dr.Burns   [place] [facility] [address] [address] Phone: [phone] Fax: [phone]

## 2024-05-01 NOTE — Assessment & Plan Note (Signed)
 New States she has difficulty swallowing pills-especially the larger ones Denies any difficulty with food or liquids Encouraged her to take 1 pill at a time and to use applesauce to help get them down

## 2024-05-02 ENCOUNTER — Other Ambulatory Visit: Payer: Self-pay

## 2024-05-02 ENCOUNTER — Other Ambulatory Visit (HOSPITAL_COMMUNITY): Payer: Self-pay

## 2024-05-02 DIAGNOSIS — M810 Age-related osteoporosis without current pathological fracture: Secondary | ICD-10-CM | POA: Diagnosis not present

## 2024-05-02 DIAGNOSIS — R1310 Dysphagia, unspecified: Secondary | ICD-10-CM | POA: Diagnosis not present

## 2024-05-02 DIAGNOSIS — E785 Hyperlipidemia, unspecified: Secondary | ICD-10-CM | POA: Diagnosis not present

## 2024-05-02 DIAGNOSIS — K219 Gastro-esophageal reflux disease without esophagitis: Secondary | ICD-10-CM | POA: Diagnosis not present

## 2024-05-02 DIAGNOSIS — Z23 Encounter for immunization: Secondary | ICD-10-CM | POA: Diagnosis not present

## 2024-05-02 DIAGNOSIS — E1169 Type 2 diabetes mellitus with other specified complication: Secondary | ICD-10-CM | POA: Diagnosis not present

## 2024-05-02 DIAGNOSIS — E1142 Type 2 diabetes mellitus with diabetic polyneuropathy: Secondary | ICD-10-CM | POA: Diagnosis not present

## 2024-05-02 DIAGNOSIS — E876 Hypokalemia: Secondary | ICD-10-CM | POA: Diagnosis not present

## 2024-05-02 DIAGNOSIS — I2510 Atherosclerotic heart disease of native coronary artery without angina pectoris: Secondary | ICD-10-CM

## 2024-05-02 DIAGNOSIS — E1140 Type 2 diabetes mellitus with diabetic neuropathy, unspecified: Secondary | ICD-10-CM | POA: Diagnosis not present

## 2024-05-02 DIAGNOSIS — F418 Other specified anxiety disorders: Secondary | ICD-10-CM | POA: Diagnosis not present

## 2024-05-02 DIAGNOSIS — K588 Other irritable bowel syndrome: Secondary | ICD-10-CM | POA: Diagnosis not present

## 2024-05-02 LAB — VITAMIN B12: Vitamin B-12: 667 pg/mL (ref 211–911)

## 2024-05-02 LAB — VITAMIN D 25 HYDROXY (VIT D DEFICIENCY, FRACTURES): VITD: 30.01 ng/mL (ref 30.00–100.00)

## 2024-05-02 MED ORDER — OZEMPIC (0.25 OR 0.5 MG/DOSE) 2 MG/3ML ~~LOC~~ SOPN
0.5000 mg | PEN_INJECTOR | SUBCUTANEOUS | 1 refills | Status: AC
  Filled 2024-05-02: qty 6, 84d supply, fill #0
  Filled 2024-05-16: qty 3, 42d supply, fill #0
  Filled 2024-07-22: qty 3, 28d supply, fill #1

## 2024-05-02 MED ORDER — ALENDRONATE SODIUM 70 MG PO TABS
70.0000 mg | ORAL_TABLET | ORAL | 3 refills | Status: AC
  Filled 2024-05-02 – 2024-05-16 (×2): qty 12, 84d supply, fill #0

## 2024-05-02 MED ORDER — MECLIZINE HCL 12.5 MG PO TABS
12.5000 mg | ORAL_TABLET | Freq: Three times a day (TID) | ORAL | 5 refills | Status: AC | PRN
  Filled 2024-05-02 – 2024-05-16 (×2): qty 30, 10d supply, fill #0

## 2024-05-02 MED ORDER — EZETIMIBE 10 MG PO TABS
10.0000 mg | ORAL_TABLET | Freq: Every day | ORAL | 10 refills | Status: AC
  Filled 2024-05-02 – 2024-05-16 (×3): qty 30, 30d supply, fill #0
  Filled 2024-06-17: qty 30, 30d supply, fill #1
  Filled 2024-07-19: qty 30, 30d supply, fill #2

## 2024-05-02 MED ORDER — CLOBETASOL PROPIONATE 0.05 % EX CREA
TOPICAL_CREAM | CUTANEOUS | 11 refills | Status: AC
  Filled 2024-05-02: qty 30, 10d supply, fill #0
  Filled 2024-05-16: qty 30, 30d supply, fill #0
  Filled 2024-07-22: qty 30, 30d supply, fill #1

## 2024-05-02 MED ORDER — POTASSIUM CHLORIDE CRYS ER 20 MEQ PO TBCR
20.0000 meq | EXTENDED_RELEASE_TABLET | Freq: Three times a day (TID) | ORAL | 11 refills | Status: AC
  Filled 2024-05-02 – 2024-05-16 (×3): qty 90, 30d supply, fill #0
  Filled 2024-06-17: qty 90, 30d supply, fill #1
  Filled 2024-07-19: qty 90, 30d supply, fill #2

## 2024-05-02 MED ORDER — OMEPRAZOLE 40 MG PO CPDR
40.0000 mg | DELAYED_RELEASE_CAPSULE | Freq: Every day | ORAL | 10 refills | Status: AC
  Filled 2024-05-02 – 2024-05-16 (×3): qty 30, 30d supply, fill #0
  Filled 2024-06-17: qty 30, 30d supply, fill #1
  Filled 2024-07-19: qty 30, 30d supply, fill #2

## 2024-05-02 MED ORDER — FAMOTIDINE 40 MG PO TABS
40.0000 mg | ORAL_TABLET | Freq: Every day | ORAL | 10 refills | Status: AC
  Filled 2024-05-02 – 2024-05-16 (×3): qty 30, 30d supply, fill #0
  Filled 2024-06-17: qty 30, 30d supply, fill #1
  Filled 2024-07-19: qty 30, 30d supply, fill #2

## 2024-05-02 MED ORDER — TERCONAZOLE 0.8 % VA CREA
1.0000 | TOPICAL_CREAM | Freq: Every day | VAGINAL | 11 refills | Status: AC
  Filled 2024-05-02 – 2024-05-16 (×2): qty 20, 3d supply, fill #0

## 2024-05-02 NOTE — Telephone Encounter (Signed)
 Pharmacy updated and meds transferred

## 2024-05-02 NOTE — Addendum Note (Signed)
 Addended by: CLAUDENE TOBIAS PARAS on: 05/02/2024 11:39 AM   Modules accepted: Orders

## 2024-05-03 ENCOUNTER — Other Ambulatory Visit (HOSPITAL_COMMUNITY): Payer: Self-pay

## 2024-05-04 ENCOUNTER — Ambulatory Visit: Payer: Self-pay | Admitting: Internal Medicine

## 2024-05-09 ENCOUNTER — Telehealth: Payer: Self-pay

## 2024-05-09 NOTE — Telephone Encounter (Signed)
 Copied from CRM #[id number]. Topic: Clinical - Lab/Test Results >> May 09, 2024  4:10 PM Leah C wrote: Reason for CRM: Reason for CRM: Patient would like to know what should she do about the cholesterol pills in regards to the most recent lab results that she received on 11/08.    Patient stated that she doesn't have a prescription for vitamin D and would like to know what to get. She said she may have some store bought but it may have expired too.   [id number] (H)

## 2024-05-10 ENCOUNTER — Ambulatory Visit: Admitting: Internal Medicine

## 2024-05-12 ENCOUNTER — Other Ambulatory Visit (HOSPITAL_COMMUNITY): Payer: Self-pay

## 2024-05-13 ENCOUNTER — Telehealth: Payer: Self-pay

## 2024-05-13 NOTE — Telephone Encounter (Signed)
 Copied from CRM #[id number]. Topic: General - Other >> May 13, 2024 12:06 PM Alfonso HERO wrote: Reason for CRM: patient calling to see if there was an update in ragrds to her previous call. Asking for a callback.

## 2024-05-15 NOTE — Telephone Encounter (Signed)
 Message left for patient to return call to clinic.

## 2024-05-16 ENCOUNTER — Telehealth (HOSPITAL_COMMUNITY): Payer: Self-pay | Admitting: Pharmacy Technician

## 2024-05-16 ENCOUNTER — Other Ambulatory Visit: Payer: Self-pay | Admitting: Internal Medicine

## 2024-05-16 ENCOUNTER — Other Ambulatory Visit: Payer: Self-pay

## 2024-05-16 ENCOUNTER — Other Ambulatory Visit (HOSPITAL_COMMUNITY): Payer: Self-pay

## 2024-05-16 MED ORDER — ROSUVASTATIN CALCIUM 5 MG PO TABS: 10.0000 mg | ORAL_TABLET | Freq: Every evening | ORAL | Status: AC

## 2024-05-16 MED ORDER — CLONAZEPAM 0.5 MG PO TABS
ORAL_TABLET | ORAL | 5 refills | Status: AC
  Filled 2024-05-16: qty 60, 30d supply, fill #0

## 2024-05-16 MED FILL — Oxcarbazepine Tab 150 MG: ORAL | 30 days supply | Qty: 60 | Fill #0 | Status: AC

## 2024-05-16 MED FILL — Oxcarbazepine Tab 150 MG: ORAL | 30 days supply | Qty: 60 | Fill #0 | Status: CN

## 2024-05-16 MED FILL — Fluconazole Tab 150 MG: ORAL | 6 days supply | Qty: 2 | Fill #0 | Status: CN

## 2024-05-16 NOTE — Addendum Note (Signed)
 Addended by: GEOFM GLADE PARAS on: 05/16/2024 03:36 PM   Modules accepted: Orders

## 2024-05-16 NOTE — Telephone Encounter (Signed)
 Yes - have her try taking 10 mg ( 2 tabs) of the crestor  daily.  Continue zetia . If joint pain increases let us  know.

## 2024-05-16 NOTE — Telephone Encounter (Signed)
 Spoke with patient today.

## 2024-05-17 ENCOUNTER — Other Ambulatory Visit (HOSPITAL_COMMUNITY): Payer: Self-pay

## 2024-05-17 ENCOUNTER — Other Ambulatory Visit: Payer: Self-pay

## 2024-05-20 ENCOUNTER — Other Ambulatory Visit: Payer: Self-pay

## 2024-05-21 ENCOUNTER — Other Ambulatory Visit: Payer: Self-pay

## 2024-05-22 ENCOUNTER — Other Ambulatory Visit: Payer: Self-pay

## 2024-05-28 NOTE — Telephone Encounter (Signed)
 Spoke with patient to increase crestor  to 2 tabs daily she has stated understanding at this time.

## 2024-05-29 ENCOUNTER — Ambulatory Visit

## 2024-05-29 VITALS — BP 134/74 | HR 74 | Ht 62.0 in | Wt 160.0 lb

## 2024-05-29 DIAGNOSIS — G4733 Obstructive sleep apnea (adult) (pediatric): Secondary | ICD-10-CM | POA: Diagnosis not present

## 2024-05-29 NOTE — Patient Instructions (Signed)
  VISIT SUMMARY: You came in today for an evaluation of your obstructive sleep apnea. You were diagnosed with this condition over ten years ago and previously used a CPAP machine but stopped due to caregiving responsibilities. You experience daytime fatigue, dry mouth in the mornings, and morning headaches. You are unsure if you currently snore or have apneic episodes.  YOUR PLAN: -OBSTRUCTIVE SLEEP APNEA: Obstructive sleep apnea is a condition where your airway becomes blocked during sleep, causing breathing pauses and poor sleep quality. We have ordered a home sleep study to reassess the severity of your condition. If the study is positive, we will start CPAP therapy with a nasal mask. CPAP therapy can help manage your sleep apnea and may also aid in controlling your seizures.  INSTRUCTIONS: Please complete the home sleep study as ordered. If the results are positive, we will discuss starting CPAP therapy with a nasal mask. Follow up with us  after completing the sleep study to review the results and plan the next steps.                      Contains text generated by Abridge.                                 Contains text generated by Abridge.

## 2024-05-29 NOTE — Progress Notes (Signed)
 Pulmonology Office Visit   Subjective:  Patient ID: [name], female    DOB: 1940/10/27  MRN: [id number]  Referred by: Geofm Glade PARAS, MD  CC:  Chief Complaint  Patient presents with   Establish Care    HPI [name] is a 83 y.o. female ex smoker (1/4 pk x 12 yrs, quit >20 yrs) with AD, CVA, OSA, allergic rhinitis, DM2, seizure disorder presents for evaluation of OSA.  Respective notes from provider reviewed as appropriate to gather relevant information for patient care.   Discussed the use of AI scribe software for clinical note transcription with the patient, who gave verbal consent to proceed.  History of Present Illness   [name] is an 83 year old female with obstructive sleep apnea who presents for evaluation of her condition. She was referred by Dr. Geofm for evaluation of sleep apnea.  She was diagnosed with obstructive sleep apnea over ten years ago, possibly under the care of Dr. Geofm, following a sleep study. She used a CPAP machine for approximately three to five years but discontinued its use due to caregiving responsibilities for her husband. She found the CPAP cumbersome but could use it if necessary.  Currently, she is unsure if she snores as no one has mentioned it to her, and she is not aware of any apneic episodes during sleep. She experiences daytime fatigue, which she manages by resting when needed. She reports dry mouth primarily in the mornings, managed with biotin , and morning headaches, which she attributes to various causes, including constipation.  Her sleep routine involves going to bed between 8:30 and 9:00 PM, taking 30 to 45 minutes to fall asleep, and waking up around 8:30 AM. She does not typically nap during the day unless it happens spontaneously.  She has a past medical history of seizures several years ago, with no recent episodes. She quit smoking over twenty years ago after being diagnosed with diabetes,  having smoked approximately five cigarettes a day for about twelve years. She drinks coffee, with her last cup being in the morning.       OSA history: Dx OSA >10 yrs ago hST. I do not have the results >used CPAP for 3-5 yrs. Stopped due to personal reasons.   PRIOR TESTS and IMAGING: Echo July 2021: Mild LVH, EF 60%, grade 1 diastolic dysfunction. No HST/PFT on file.     05/29/2024    1:00 PM  Results of the Epworth flowsheet  Sitting and reading 2  Watching TV 2  Sitting, inactive in a public place (e.g. a theatre or a meeting) 0  As a passenger in a car for an hour without a break 2  Lying down to rest in the afternoon when circumstances permit 2  Sitting and talking to someone 0  Sitting quietly after a lunch without alcohol  1  In a car, while stopped for a few minutes in traffic 0  Total score 9    Allergies: Cymbalta  [duloxetine  hcl], Effexor  [venlafaxine ], Hm lidocaine  patch [lidocaine ], Linzess  [linaclotide ], Other, Statins, and Penicillins  Current Outpatient Medications:    acetaminophen  (TYLENOL ) 500 MG tablet, Take 500 mg by mouth every 6 (six) hours as needed for headache (pain)., Disp: , Rfl:    albuterol  (VENTOLIN  HFA) 108 (90 Base) MCG/ACT inhaler, Inhale 2 puffs into the lungs every 6 (six) hours as needed for wheezing or shortness of breath., Disp: , Rfl:    alendronate  (FOSAMAX ) 70 MG tablet, Take 1 tablet (70  mg total) by mouth every 7 (seven) days. Take with a full glass of water on an empty stomach., Disp: 12 tablet, Rfl: 3   aspirin  EC 81 MG tablet, Take 1 tablet (81 mg total) by mouth daily., Disp: 90 tablet, Rfl: 3   clobetasol  cream (TEMOVATE ) 0.05 %, APPLY TOPICALLY TWICE DAILY AS NEEDED, Disp: 30 g, Rfl: 11   clonazePAM  (KLONOPIN ) 0.5 MG tablet, TAKE 1/2 TO 1 TABLET BY MOUTH TWICE A DAY AS NEEDED FOR ANXIETY, Disp: 60 tablet, Rfl: 5   cyclobenzaprine  (FLEXERIL ) 5 MG tablet, Take 1 tablet (5 mg total) by mouth 3 (three) times daily as needed., Disp: 40  tablet, Rfl: 1   dicyclomine  (BENTYL ) 10 MG capsule, TAKE 1 CAPSULE BY MOUTH 3 TIMES DAILY BEFORE MEALS *PLEASE SCHEDULE APPOINTMENT FOR FURTHER REFILLS* *REFILL REQUEST* (Patient taking differently: Take 10 mg by mouth 2 (two) times daily before a meal.), Disp: 90 capsule, Rfl: 10   docusate sodium  (COLACE) 100 MG capsule, Take 1 capsule (100 mg total) by mouth daily., Disp: , Rfl:    ezetimibe  (ZETIA ) 10 MG tablet, TAKE 1 TABLET BY MOUTH ONCE DAILY, Disp: 30 tablet, Rfl: 10   famotidine  (PEPCID ) 40 MG tablet, TAKE 1 TABLET BY MOUTH ONCE DAILY, Disp: 30 tablet, Rfl: 10   fluconazole  (DIFLUCAN ) 150 MG tablet, TAKE 1 TABLET BY MOUTH EVERY 3 DAYS, Disp: 2 tablet, Rfl: 11   fluticasone  (FLONASE ) 50 MCG/ACT nasal spray, Place 2 sprays into both nostrils 2 (two) times daily as needed for allergies or rhinitis., Disp: , Rfl:    lubiprostone  ([name] ) 24 MCG capsule, Take 24 mcg by mouth 2 (two) times daily., Disp: , Rfl:    lubiprostone  ([name] ) 24 MCG capsule, Take 1 capsule (24 mcg total) by mouth 2 (two) times daily with meals, Disp: 180 capsule, Rfl: 1   meclizine  (ANTIVERT ) 12.5 MG tablet, Take 1 tablet (12.5 mg total) by mouth 3 (three) times daily as needed for dizziness., Disp: 30 tablet, Rfl: 5   Multiple Vitamin (MULTIVITAMIN WITH MINERALS) TABS tablet, Take 1 tablet by mouth daily after lunch., Disp: , Rfl:    nitroGLYCERIN  (NITROSTAT ) 0.4 MG SL tablet, DISSOLVE 1 TABLET UNDER THE TONGUE AS NEEDED FOR CHEST PAIN EVERY 5 MINUTES UP TO 3 TIMES. IF NO RELIEF CALL 911. *REFILL REQUEST*, Disp: 25 tablet, Rfl: 10   omeprazole  (PRILOSEC) 40 MG capsule, TAKE 1 CAPSULE BY MOUTH ONCE DAILY, Disp: 30 capsule, Rfl: 10   OXcarbazepine  (TRILEPTAL ) 150 MG tablet, TAKE 1 TABLET BY MOUTH TWICE DAILY, Disp: 180 tablet, Rfl: 1   potassium chloride  SA (KLOR-CON  M) 20 MEQ tablet, TAKE 1 TABLET BY MOUTH 3 TIMES DAILY, Disp: 90 tablet, Rfl: 11   pregabalin  (LYRICA ) 100 MG capsule, TAKE 1 CAPSULE BY MOUTH 3 TIMES  DAILY, Disp: 90 capsule, Rfl: 2   Probiotic Product (PROBIOTIC PO), Take 1 capsule by mouth daily after breakfast., Disp: , Rfl:    rosuvastatin  (CRESTOR ) 5 MG tablet, Take 2 tablets (10 mg total) by mouth at bedtime., Disp: , Rfl:    Semaglutide ,0.25 or 0.5MG /DOS, (OZEMPIC , 0.25 OR 0.5 MG/DOSE,) 2 MG/3ML SOPN, Inject 0.25 mg under the skin once weekly for 4 weeks, then inject 0.5 mg  weekly, Disp: 6 mL, Rfl: 1   terconazole  (TERAZOL 3 ) 0.8 % vaginal cream, INSERT 1 APPLICATORFUL VAGINALLY FOR 3 DAYS AS NEEDED, Disp: 20 g, Rfl: 11   traMADol  (ULTRAM ) 50 MG tablet, Take 1 tablet (50 mg total) by mouth every 6 (six) hours as  needed., Disp: 30 tablet, Rfl: 0 Past Medical History:  Diagnosis Date   Abdominal distension, gaseous 04/10/2017   Allergic rhinitis 03/13/2013   CAD (coronary artery disease) 05/28/2018   Cerebrovascular disease    Chest pain 10/12/2016   Degeneration of lumbar intervertebral disc 08/02/2017   Diabetic neuropathy 05/12/2014   Did not tolerated cymbalta  - hives, itching Tried nortriptyline  - not effective Did not tolerate Effexor -nausea gabapentin -not effective    Diverticulitis of large intestine 05/12/2014   Dyslipidemia    Gastroesophageal reflux disease 03/18/2009   Hearing loss 02/01/2017   Hypercalcemia 10/12/2016   Hypertension    Hypokalemia 01/06/2020   Insomnia 03/20/2009   Irritable bowel syndrome 03/20/2009   With chronic constipation   Laryngopharyngeal reflux (LPR) 09/27/2016   Lumbar pain 08/23/2016   Mild neurocognitive disorder due to multiple etiologies 11/06/2020   Myofascial pain 05/19/2015   Neck pain 09/27/2016   Non-toxic multinodular goiter 05/18/2009   Obstructive sleep apnea 03/18/2009   Not using CPAP   Osteoarthritis of left knee 01/12/2017   Osteoarthritis of shoulder region    Pain in joint of right shoulder 01/21/2019   Presbycusis of both ears 09/27/2016   Seizure disorder    Situational anxiety    Spondylolisthesis 09/20/2017   Steatosis of  liver 05/12/2014   Type 2 diabetes mellitus 05/12/2014   Unstable angina 01/05/2020   Vertigo 04/13/2009   Past Surgical History:  Procedure Laterality Date   ABDOMINAL HYSTERECTOMY  1970's   Partial   APPENDECTOMY     CHOLECYSTECTOMY     LEFT HEART CATH AND CORONARY ANGIOGRAPHY N/A 05/28/2018   Procedure: LEFT HEART CATH AND CORONARY ANGIOGRAPHY;  Surgeon: Claudene Victory ORN, MD;  Location: [facility];  Service: Cardiovascular;  Laterality: N/A;   LEFT HEART CATH AND CORONARY ANGIOGRAPHY N/A 01/06/2020   Procedure: LEFT HEART CATH AND CORONARY ANGIOGRAPHY;  Surgeon: Claudene Victory ORN, MD;  Location: [facility];  Service: Cardiovascular;  Laterality: N/A;   LUMBAR EPIDURAL INJECTION Left 08/25/2017   SHOULDER SURGERY  2008   LT, post fall    TONSILLECTOMY AND ADENOIDECTOMY     TOTAL KNEE ARTHROPLASTY Left 01/12/2017   Procedure: LEFT TOTAL KNEE ARTHROPLASTY;  Surgeon: Duwayne Purchase, MD;  Location: [facility];  Service: Orthopedics;  Laterality: Left;  120 mins   Family History  Problem Relation Age of Onset   Arthritis Mother    Heart disease Father    Arthritis Other        Grandmother   Diabetes Other        Grandmother   Colon cancer Neg Hx    Stomach cancer Neg Hx    Pancreatic cancer Neg Hx    Social History   Socioeconomic History   Marital status: Widowed    Spouse name: Not on file   Number of children: 2   Years of education: 16   Highest education level: Bachelor's degree (e.g., BA, AB, BS)  Occupational History   Occupation: Retired    Comment: admin at [employer]  Tobacco Use   Smoking status: Former    Current packs/day: 0.00    Types: Cigarettes    Quit date: 10/19/1985    Years since quitting: 38.6   Smokeless tobacco: Never  Vaping Use   Vaping status: Never Used  Substance and Sexual Activity   Alcohol  use: Yes    Alcohol /week: 0.0 standard drinks of alcohol     Comment: rare glass of wine   Drug use: No  Sexual activity: Never  Other Topics  Concern   Not on file  Social History Narrative   Patient lives in a one story home.  Has 2 children.  Retired from [employer].   Social Drivers of Corporate Investment Banker Strain: Low Risk  (04/24/2024)   Overall Financial Resource Strain (CARDIA)    Difficulty of Paying Living Expenses: Not hard at all  Food Insecurity: No Food Insecurity (04/24/2024)   Hunger Vital Sign    Worried About Running Out of Food in the Last Year: Never true    Ran Out of Food in the Last Year: Never true  Transportation Needs: No Transportation Needs (04/24/2024)   PRAPARE - Administrator, Civil Service (Medical): No    Lack of Transportation (Non-Medical): No  Physical Activity: Insufficiently Active (04/24/2024)   Exercise Vital Sign    Days of Exercise per Week: 3 days    Minutes of Exercise per Session: 30 min  Stress: No Stress Concern Present (04/24/2024)   [employer] of Occupational Health - Occupational Stress Questionnaire    Feeling of Stress: Only a little  Social Connections: Moderately Integrated (04/24/2024)   Social Connection and Isolation Panel    Frequency of Communication with Friends and Family: More than three times a week    Frequency of Social Gatherings with Friends and Family: Once a week    Attends Religious Services: More than 4 times per year    Active Member of [employer] or Organizations: Yes    Attends Banker Meetings: 1 to 4 times per year    Marital Status: Widowed  Intimate Partner Violence: Not At Risk (04/24/2024)   Humiliation, Afraid, Rape, and Kick questionnaire    Fear of Current or Ex-Partner: No    Emotionally Abused: No    Physically Abused: No    Sexually Abused: No       Objective:  BP 134/74   Pulse 74   Ht 5' 2 (1.575 m)   Wt 160 lb (72.6 kg)   SpO2 99%   BMI 29.26 kg/m  BMI Readings from Last 3 Encounters:  05/29/24 29.26 kg/m  05/01/24 29.34 kg/m  04/24/24 29.52 kg/m    Physical Exam: Physical Exam    ENT: Normal mucosa. No hypertrophy of inferior turbinates. Tonsils are normal sized. Modified Mallampati score is normal. PULMONARY: Lungs clear to auscultation bilaterally, no adventitious breath sounds. CARDIOVASCULAR: Regular rate and rhythm, S1 S2 normal, no murmurs. ABDOMEN: Abdomen soft, nontender. Bowel sounds are normal. EXTREMITIES: No peripheral edema noted.       Diagnostic Review:  Last metabolic panel Lab Results  Component Value Date   GLUCOSE 91 05/01/2024   NA 140 05/01/2024   K 3.9 05/01/2024   CL 102 05/01/2024   CO2 30 05/01/2024   BUN 11 05/01/2024   CREATININE 0.77 05/01/2024   GFR 72.37 05/01/2024   CALCIUM  10.1 05/01/2024   PHOS 2.9 05/04/2023   PROT 7.8 05/01/2024   ALBUMIN 4.3 05/01/2024   BILITOT 0.2 05/01/2024   ALKPHOS 63 05/01/2024   AST 27 05/01/2024   ALT 18 05/01/2024   ANIONGAP 10 05/04/2023         Assessment & Plan:   Assessment & Plan OSA (obstructive sleep apnea)  Orders:   Home sleep test; Future     Assessment and Plan    Obstructive sleep apnea Long-standing diagnosis with symptoms of daytime fatigue, dry mouth, and headaches. No current snoring or observed apneas.  Sleep study required to reassess severity. - Ordered home sleep study. - If positive, initiate CPAP therapy with nasal mask. - Educated on CPAP benefits for sleep apnea management and seizure control.      I discussed with the patient the pathophysiology of obstructive sleep apnea, its association with weight, and its negative effects on hypertension, diabetes, mental health, A-fib, stroke if left untreated.  I briefly discussed the treatment options for obstructive sleep apnea    Notes from 05/01/24 PCP.  reviewed as to gather relevant information for patient care and formulating plan.   Return for 1 month after CPAP setup.   I personally spent a total of 30 minutes in the care of the patient today including preparing to see the patient, getting/reviewing  separately obtained history, performing a medically appropriate exam/evaluation, counseling and educating, placing orders, documenting clinical information in the EHR, independently interpreting results, and communicating results.   Carmello Cabiness, MD

## 2024-06-12 ENCOUNTER — Other Ambulatory Visit: Payer: Self-pay

## 2024-06-17 ENCOUNTER — Other Ambulatory Visit: Payer: Self-pay

## 2024-06-18 ENCOUNTER — Other Ambulatory Visit: Payer: Self-pay

## 2024-06-19 ENCOUNTER — Other Ambulatory Visit (HOSPITAL_COMMUNITY): Payer: Self-pay

## 2024-07-19 ENCOUNTER — Other Ambulatory Visit (HOSPITAL_COMMUNITY): Payer: Self-pay

## 2024-07-19 ENCOUNTER — Other Ambulatory Visit: Payer: Self-pay

## 2024-07-20 ENCOUNTER — Other Ambulatory Visit (HOSPITAL_BASED_OUTPATIENT_CLINIC_OR_DEPARTMENT_OTHER): Payer: Self-pay

## 2024-07-22 ENCOUNTER — Other Ambulatory Visit: Payer: Self-pay

## 2024-07-22 MED FILL — Oxcarbazepine Tab 150 MG: ORAL | 30 days supply | Qty: 60 | Fill #1 | Status: AC

## 2024-07-23 ENCOUNTER — Other Ambulatory Visit: Payer: Self-pay

## 2025-05-05 ENCOUNTER — Ambulatory Visit

## 2025-05-05 ENCOUNTER — Encounter: Admitting: Internal Medicine
